# Patient Record
Sex: Male | Born: 1940 | Race: Black or African American | Hispanic: No | Smoking: Never smoker
Health system: Southern US, Community
[De-identification: ages and names within clinical notes are randomized; demographics above are authoritative.]

## PROBLEM LIST (undated history)

## (undated) ENCOUNTER — Ambulatory Visit: Payer: MEDICARE

## (undated) ENCOUNTER — Ambulatory Visit: Attending: Hematology & Oncology | Primary: Hematology & Oncology

## (undated) ENCOUNTER — Encounter

## (undated) ENCOUNTER — Telehealth
Attending: Student in an Organized Health Care Education/Training Program | Primary: Student in an Organized Health Care Education/Training Program

## (undated) ENCOUNTER — Ambulatory Visit

## (undated) ENCOUNTER — Encounter: Attending: Hematology & Oncology | Primary: Hematology & Oncology

## (undated) ENCOUNTER — Encounter: Attending: Pharmacist | Primary: Pharmacist

## (undated) ENCOUNTER — Encounter: Attending: Hematology | Primary: Hematology

## (undated) ENCOUNTER — Telehealth

## (undated) ENCOUNTER — Inpatient Hospital Stay

## (undated) ENCOUNTER — Telehealth: Attending: Hematology & Oncology | Primary: Hematology & Oncology

## (undated) ENCOUNTER — Inpatient Hospital Stay: Payer: MEDICARE

## (undated) ENCOUNTER — Ambulatory Visit: Payer: MEDICARE | Attending: Adult Health | Primary: Adult Health

## (undated) ENCOUNTER — Ambulatory Visit: Payer: MEDICARE | Attending: Hematology & Oncology | Primary: Hematology & Oncology

## (undated) DIAGNOSIS — I10 Essential (primary) hypertension: Secondary | ICD-10-CM

## (undated) DIAGNOSIS — R0600 Dyspnea, unspecified: Secondary | ICD-10-CM

## (undated) DIAGNOSIS — D649 Anemia, unspecified: Secondary | ICD-10-CM

## (undated) DIAGNOSIS — C9310 Chronic myelomonocytic leukemia not having achieved remission: Secondary | ICD-10-CM

## (undated) HISTORY — PX: HERNIA REPAIR: SHX51

## (undated) HISTORY — DX: Chronic myelomonocytic leukemia not having achieved remission: C93.10

---

## 2004-11-21 ENCOUNTER — Ambulatory Visit: Payer: Self-pay | Admitting: Internal Medicine

## 2004-12-12 ENCOUNTER — Ambulatory Visit: Payer: Self-pay | Admitting: Internal Medicine

## 2007-12-16 ENCOUNTER — Ambulatory Visit: Payer: Self-pay | Admitting: Unknown Physician Specialty

## 2010-05-23 ENCOUNTER — Emergency Department: Payer: Self-pay | Admitting: Internal Medicine

## 2011-10-30 ENCOUNTER — Emergency Department: Payer: Self-pay | Admitting: Emergency Medicine

## 2014-03-23 DIAGNOSIS — E6609 Other obesity due to excess calories: Secondary | ICD-10-CM | POA: Diagnosis not present

## 2014-03-23 DIAGNOSIS — E559 Vitamin D deficiency, unspecified: Secondary | ICD-10-CM | POA: Diagnosis not present

## 2014-03-23 DIAGNOSIS — D649 Anemia, unspecified: Secondary | ICD-10-CM | POA: Diagnosis not present

## 2014-03-23 DIAGNOSIS — E782 Mixed hyperlipidemia: Secondary | ICD-10-CM | POA: Diagnosis not present

## 2014-03-23 DIAGNOSIS — I10 Essential (primary) hypertension: Secondary | ICD-10-CM | POA: Diagnosis not present

## 2014-04-06 DIAGNOSIS — L728 Other follicular cysts of the skin and subcutaneous tissue: Secondary | ICD-10-CM | POA: Diagnosis not present

## 2014-04-06 DIAGNOSIS — L720 Epidermal cyst: Secondary | ICD-10-CM | POA: Diagnosis not present

## 2014-04-13 DIAGNOSIS — L728 Other follicular cysts of the skin and subcutaneous tissue: Secondary | ICD-10-CM | POA: Diagnosis not present

## 2014-07-02 DIAGNOSIS — E539 Vitamin B deficiency, unspecified: Secondary | ICD-10-CM | POA: Diagnosis not present

## 2014-07-02 DIAGNOSIS — E559 Vitamin D deficiency, unspecified: Secondary | ICD-10-CM | POA: Diagnosis not present

## 2014-07-02 DIAGNOSIS — E782 Mixed hyperlipidemia: Secondary | ICD-10-CM | POA: Diagnosis not present

## 2014-07-02 DIAGNOSIS — D649 Anemia, unspecified: Secondary | ICD-10-CM | POA: Diagnosis not present

## 2014-07-02 DIAGNOSIS — I10 Essential (primary) hypertension: Secondary | ICD-10-CM | POA: Diagnosis not present

## 2014-07-23 DIAGNOSIS — E6609 Other obesity due to excess calories: Secondary | ICD-10-CM | POA: Diagnosis not present

## 2014-07-23 DIAGNOSIS — E782 Mixed hyperlipidemia: Secondary | ICD-10-CM | POA: Diagnosis not present

## 2014-07-23 DIAGNOSIS — I10 Essential (primary) hypertension: Secondary | ICD-10-CM | POA: Diagnosis not present

## 2014-07-23 DIAGNOSIS — E559 Vitamin D deficiency, unspecified: Secondary | ICD-10-CM | POA: Diagnosis not present

## 2014-09-29 DIAGNOSIS — M722 Plantar fascial fibromatosis: Secondary | ICD-10-CM | POA: Diagnosis not present

## 2014-09-29 DIAGNOSIS — M7732 Calcaneal spur, left foot: Secondary | ICD-10-CM | POA: Diagnosis not present

## 2014-09-29 DIAGNOSIS — M79672 Pain in left foot: Secondary | ICD-10-CM | POA: Diagnosis not present

## 2014-10-14 DIAGNOSIS — E539 Vitamin B deficiency, unspecified: Secondary | ICD-10-CM | POA: Diagnosis not present

## 2014-10-14 DIAGNOSIS — D649 Anemia, unspecified: Secondary | ICD-10-CM | POA: Diagnosis not present

## 2014-10-19 DIAGNOSIS — K5793 Diverticulitis of intestine, part unspecified, without perforation or abscess with bleeding: Secondary | ICD-10-CM | POA: Diagnosis not present

## 2014-10-19 DIAGNOSIS — K921 Melena: Secondary | ICD-10-CM | POA: Diagnosis not present

## 2014-10-19 DIAGNOSIS — D649 Anemia, unspecified: Secondary | ICD-10-CM | POA: Diagnosis not present

## 2014-10-26 DIAGNOSIS — D649 Anemia, unspecified: Secondary | ICD-10-CM | POA: Diagnosis not present

## 2014-10-26 DIAGNOSIS — K5793 Diverticulitis of intestine, part unspecified, without perforation or abscess with bleeding: Secondary | ICD-10-CM | POA: Diagnosis not present

## 2014-10-26 DIAGNOSIS — D6481 Anemia due to antineoplastic chemotherapy: Secondary | ICD-10-CM | POA: Diagnosis not present

## 2014-11-02 DIAGNOSIS — M722 Plantar fascial fibromatosis: Secondary | ICD-10-CM | POA: Diagnosis not present

## 2014-11-03 DIAGNOSIS — K5793 Diverticulitis of intestine, part unspecified, without perforation or abscess with bleeding: Secondary | ICD-10-CM | POA: Diagnosis not present

## 2014-11-03 DIAGNOSIS — D500 Iron deficiency anemia secondary to blood loss (chronic): Secondary | ICD-10-CM | POA: Diagnosis not present

## 2015-01-06 DIAGNOSIS — M722 Plantar fascial fibromatosis: Secondary | ICD-10-CM | POA: Diagnosis not present

## 2015-01-06 DIAGNOSIS — M7732 Calcaneal spur, left foot: Secondary | ICD-10-CM | POA: Diagnosis not present

## 2015-02-01 DIAGNOSIS — Z0001 Encounter for general adult medical examination with abnormal findings: Secondary | ICD-10-CM | POA: Diagnosis not present

## 2015-02-01 DIAGNOSIS — I10 Essential (primary) hypertension: Secondary | ICD-10-CM | POA: Diagnosis not present

## 2015-02-01 DIAGNOSIS — Z8612 Personal history of poliomyelitis: Secondary | ICD-10-CM | POA: Diagnosis not present

## 2015-02-01 DIAGNOSIS — Z23 Encounter for immunization: Secondary | ICD-10-CM | POA: Diagnosis not present

## 2015-02-01 DIAGNOSIS — D500 Iron deficiency anemia secondary to blood loss (chronic): Secondary | ICD-10-CM | POA: Diagnosis not present

## 2015-02-01 DIAGNOSIS — Z125 Encounter for screening for malignant neoplasm of prostate: Secondary | ICD-10-CM | POA: Diagnosis not present

## 2015-02-02 DIAGNOSIS — Z125 Encounter for screening for malignant neoplasm of prostate: Secondary | ICD-10-CM | POA: Diagnosis not present

## 2015-02-02 DIAGNOSIS — R7301 Impaired fasting glucose: Secondary | ICD-10-CM | POA: Diagnosis not present

## 2015-02-02 DIAGNOSIS — E782 Mixed hyperlipidemia: Secondary | ICD-10-CM | POA: Diagnosis not present

## 2015-02-02 DIAGNOSIS — Z0001 Encounter for general adult medical examination with abnormal findings: Secondary | ICD-10-CM | POA: Diagnosis not present

## 2015-02-02 DIAGNOSIS — I10 Essential (primary) hypertension: Secondary | ICD-10-CM | POA: Diagnosis not present

## 2015-02-03 DIAGNOSIS — M7732 Calcaneal spur, left foot: Secondary | ICD-10-CM | POA: Diagnosis not present

## 2015-02-03 DIAGNOSIS — M722 Plantar fascial fibromatosis: Secondary | ICD-10-CM | POA: Diagnosis not present

## 2015-04-05 DIAGNOSIS — D509 Iron deficiency anemia, unspecified: Secondary | ICD-10-CM | POA: Diagnosis not present

## 2015-04-05 DIAGNOSIS — I10 Essential (primary) hypertension: Secondary | ICD-10-CM | POA: Insufficient documentation

## 2015-04-05 DIAGNOSIS — D500 Iron deficiency anemia secondary to blood loss (chronic): Secondary | ICD-10-CM | POA: Insufficient documentation

## 2015-04-30 ENCOUNTER — Encounter: Payer: Self-pay | Admitting: *Deleted

## 2015-05-03 ENCOUNTER — Encounter
Admission: RE | Disposition: A | Discharge status: Home / Self Care | Payer: Self-pay | Source: Ambulatory Visit | Attending: Gastroenterology

## 2015-05-03 ENCOUNTER — Ambulatory Visit: Payer: Medicare Other | Admitting: Anesthesiology

## 2015-05-03 ENCOUNTER — Encounter: Payer: Self-pay | Admitting: *Deleted

## 2015-05-03 ENCOUNTER — Ambulatory Visit
Admission: RE | Admit: 2015-05-03 | Discharge: 2015-05-03 | Disposition: A | Discharge status: Home / Self Care | Payer: Medicare Other | Source: Ambulatory Visit | Attending: Gastroenterology | Admitting: Gastroenterology

## 2015-05-03 DIAGNOSIS — D509 Iron deficiency anemia, unspecified: Secondary | ICD-10-CM | POA: Insufficient documentation

## 2015-05-03 DIAGNOSIS — Z538 Procedure and treatment not carried out for other reasons: Secondary | ICD-10-CM | POA: Diagnosis not present

## 2015-05-03 DIAGNOSIS — I10 Essential (primary) hypertension: Secondary | ICD-10-CM | POA: Diagnosis not present

## 2015-05-03 DIAGNOSIS — K921 Melena: Secondary | ICD-10-CM | POA: Insufficient documentation

## 2015-05-03 DIAGNOSIS — K562 Volvulus: Secondary | ICD-10-CM | POA: Insufficient documentation

## 2015-05-03 DIAGNOSIS — Z79899 Other long term (current) drug therapy: Secondary | ICD-10-CM | POA: Insufficient documentation

## 2015-05-03 HISTORY — PX: ESOPHAGOGASTRODUODENOSCOPY (EGD) WITH PROPOFOL: SHX5813

## 2015-05-03 HISTORY — DX: Essential (primary) hypertension: I10

## 2015-05-03 HISTORY — PX: COLONOSCOPY WITH PROPOFOL: SHX5780

## 2015-05-03 HISTORY — DX: Anemia, unspecified: D64.9

## 2015-05-03 SURGERY — COLONOSCOPY WITH PROPOFOL
Anesthesia: General

## 2015-05-03 MED ORDER — FENTANYL CITRATE (PF) 100 MCG/2ML IJ SOLN
INTRAMUSCULAR | Status: DC | PRN
  Administered 2015-05-03: 50 ug via INTRAVENOUS

## 2015-05-03 MED ORDER — SODIUM CHLORIDE 0.9 % IV SOLN: INTRAVENOUS | Status: DC

## 2015-05-03 MED ORDER — PROPOFOL 500 MG/50ML IV EMUL
INTRAVENOUS | Status: DC | PRN
  Administered 2015-05-03: 50 ug/kg/min via INTRAVENOUS

## 2015-05-03 MED ORDER — SODIUM CHLORIDE 0.9 % IV SOLN
INTRAVENOUS | Status: DC | PRN
  Administered 2015-05-03: 12:00:00 via INTRAVENOUS

## 2015-05-03 MED ORDER — SODIUM CHLORIDE 0.9 % IV SOLN
INTRAVENOUS | Status: DC
  Administered 2015-05-03: 12:00:00 via INTRAVENOUS

## 2015-05-03 MED ORDER — MIDAZOLAM HCL 2 MG/2ML IJ SOLN
INTRAMUSCULAR | Status: DC | PRN
  Administered 2015-05-03: 1 mg via INTRAVENOUS

## 2015-05-03 NOTE — Anesthesia Preprocedure Evaluation (Signed)
Anesthesia Evaluation  Patient identified by MRN, date of birth, ID band Patient awake    Reviewed: Allergy & Precautions, H&P , NPO status , Patient's Chart, lab work & pertinent test results, reviewed documented beta blocker date and time   Airway Mallampati: II   Neck ROM: full    Dental  (+) Partial Upper   Pulmonary neg pulmonary ROS,    Pulmonary exam normal        Cardiovascular hypertension, negative cardio ROS Normal cardiovascular exam     Neuro/Psych negative neurological ROS  negative psych ROS   GI/Hepatic negative GI ROS, Neg liver ROS,   Endo/Other  negative endocrine ROS  Renal/GU negative Renal ROS  negative genitourinary   Musculoskeletal   Abdominal   Peds  Hematology negative hematology ROS (+) anemia ,   Anesthesia Other Findings Past Medical History:   Hypertension                                                 Anemia                                                     Past Surgical History:   HERNIA REPAIR                                                   Comment:1975 BMI    Body Mass Index   39.62 kg/m 2     Reproductive/Obstetrics                             Anesthesia Physical Anesthesia Plan  ASA: III  Anesthesia Plan: General   Post-op Pain Management:    Induction:   Airway Management Planned:   Additional Equipment:   Intra-op Plan:   Post-operative Plan:   Informed Consent: I have reviewed the patients History and Physical, chart, labs and discussed the procedure including the risks, benefits and alternatives for the proposed anesthesia with the patient or authorized representative who has indicated his/her understanding and acceptance.   Dental Advisory Given  Plan Discussed with: CRNA  Anesthesia Plan Comments:         Anesthesia Quick Evaluation

## 2015-05-03 NOTE — Op Note (Signed)
[facility] Gastroenterology Patient Name: Samuel Frank Procedure Date: 05/03/2015 12:29 PM MRN: [id number] Account #: [id number] Date of Birth: [birth year] Admit Type: Outpatient Age: 75 Room: [facility] ENDO ROOM 4 Gender: Male Note Status: Finalized Procedure:            Upper GI endoscopy Indications:          Iron deficiency anemia Providers:            Samuel Dawn. Candace Cruise, MD Referring MD:         Lavera Guise, MD (Referring MD) Medicines:            Monitored Anesthesia Care Complications:        No immediate complications. Procedure:            Pre-Anesthesia Assessment:                       - Prior to the procedure, a History and Physical was                        performed, and patient medications, allergies and                        sensitivities were reviewed. The patient's tolerance of                        previous anesthesia was reviewed.                       - The risks and benefits of the procedure and the                        sedation options and risks were discussed with the                        patient. All questions were answered and informed                        consent was obtained.                       - After reviewing the risks and benefits, the patient                        was deemed in satisfactory condition to undergo the                        procedure.                       After obtaining informed consent, the endoscope was                        passed under direct vision. Throughout the procedure,                        the patient's blood pressure, pulse, and oxygen                        saturations were monitored continuously. The Endoscope  was introduced through the mouth, and advanced to the                        second part of duodenum. The upper GI endoscopy was                        accomplished without difficulty. The patient tolerated                        the procedure well. Findings:  The examined esophagus was normal.      The entire examined stomach was normal.      The examined duodenum was normal. Impression:           - Normal esophagus.                       - Normal stomach.                       - Normal examined duodenum.                       - No specimens collected. Recommendation:       - Discharge patient to home.                       - Observe patient's clinical course.                       - The findings and recommendations were discussed with                        the patient. Procedure Code(s):    --- Professional ---                       [phone], Esophagogastroduodenoscopy, flexible, transoral;                        diagnostic, including collection of specimen(s) by                        brushing or washing, when performed (separate procedure) Diagnosis Code(s):    --- Professional ---                       D50.9, Iron deficiency anemia, unspecified CPT copyright 2016 American Medical Association. All rights reserved. The codes documented in this report are preliminary and upon coder review may  be revised to meet current compliance requirements. Samuel Luster, MD 05/03/2015 12:38:30 PM This report has been signed electronically. Number of Addenda: 0 Note Initiated On: 05/03/2015 12:29 PM      [facility]

## 2015-05-03 NOTE — Transfer of Care (Signed)
Immediate Anesthesia Transfer of Care Note  Patient: Samuel Frank.  Procedure(s) Performed: Procedure(s): COLONOSCOPY WITH PROPOFOL (N/A) ESOPHAGOGASTRODUODENOSCOPY (EGD) WITH PROPOFOL (N/A)  Patient Location: PACU  Anesthesia Type:General  Level of Consciousness: awake and alert   Airway & Oxygen Therapy: Patient Spontanous Breathing and Patient connected to nasal cannula oxygen  Post-op Assessment: Report given to RN  Post vital signs: Reviewed and stable  Last Vitals:  Filed Vitals:   05/03/15 1121 05/03/15 1253  BP: 125/68 97/68  Pulse: 91 84  Temp: 37.2 C 36 C  Resp: 16 16    Complications: No apparent anesthesia complications

## 2015-05-03 NOTE — H&P (Signed)
  Date of Initial H&P:04/05/2015  History reviewed, patient examined, no change in status, stable for surgery.

## 2015-05-03 NOTE — Op Note (Signed)
[facility] Gastroenterology Patient Name: Samuel Frank Procedure Date: 05/03/2015 12:28 PM MRN: [id number] Account #: [id number] Date of Birth: [birth year] Admit Type: Outpatient Age: 75 Room: [facility] ENDO ROOM 4 Gender: Male Note Status: Finalized Procedure:            Colonoscopy Indications:          Iron deficiency anemia Providers:            Lupita Dawn. Candace Cruise, MD Referring MD:         Lavera Guise, MD (Referring MD) Medicines:            Monitored Anesthesia Care Complications:        No immediate complications. Procedure:            Pre-Anesthesia Assessment:                       - Prior to the procedure, a History and Physical was                        performed, and patient medications, allergies and                        sensitivities were reviewed. The patient's tolerance of                        previous anesthesia was reviewed.                       - The risks and benefits of the procedure and the                        sedation options and risks were discussed with the                        patient. All questions were answered and informed                        consent was obtained.                       - After reviewing the risks and benefits, the patient                        was deemed in satisfactory condition to undergo the                        procedure.                       After obtaining informed consent, the colonoscope was                        passed under direct vision. Throughout the procedure,                        the patient's blood pressure, pulse, and oxygen                        saturations were monitored continuously. The Olympus  CF-H180AL colonoscope ( S#: [id number] ) was introduced                        through the anus and advanced to the the ascending                        colon. The colonoscopy was performed with moderate                        difficulty due to significant looping. Findings:     The colon (entire examined portion) appeared normal. Unable to reach       scope beyond ascending colon despite manual pressure and changes in       position. Impression:           - The entire examined colon is normal.                       - No specimens collected. Recommendation:       - Discharge patient to home.                       - The findings and recommendations were discussed with                        the patient.                       - Consider BE to evaluate right side of colon if anemia                        persists. Procedure Code(s):    --- Professional ---                       [phone], 47, Colonoscopy, flexible; diagnostic, including                        collection of specimen(s) by brushing or washing, when                        performed (separate procedure) Diagnosis Code(s):    --- Professional ---                       D50.9, Iron deficiency anemia, unspecified CPT copyright 2016 American Medical Association. All rights reserved. The codes documented in this report are preliminary and upon coder review may  be revised to meet current compliance requirements. [name], MD 05/03/2015 1:12:40 PM This report has been signed electronically. Number of Addenda: 0 Note Initiated On: 05/03/2015 12:28 PM Scope Withdrawal Time: 0 hours 13 minutes 15 seconds  Total Procedure Duration: 0 hours 20 minutes 14 seconds       [facility]

## 2015-05-03 NOTE — Anesthesia Postprocedure Evaluation (Signed)
Anesthesia Post Note  Patient: Damany Krikorian.  Procedure(s) Performed: Procedure(s) (LRB): COLONOSCOPY WITH PROPOFOL (N/A) ESOPHAGOGASTRODUODENOSCOPY (EGD) WITH PROPOFOL (N/A)  Patient location during evaluation: PACU Anesthesia Type: General Level of consciousness: awake and alert Pain management: pain level controlled Vital Signs Assessment: post-procedure vital signs reviewed and stable Respiratory status: spontaneous breathing, nonlabored ventilation, respiratory function stable and patient connected to nasal cannula oxygen Cardiovascular status: blood pressure returned to baseline and stable Postop Assessment: no signs of nausea or vomiting Anesthetic complications: no    Last Vitals:  Filed Vitals:   05/03/15 1254 05/03/15 1304  BP: 106/48 106/60  Pulse: 84 81  Temp: 35.6 C   Resp: 16 18    Last Pain: There were no vitals filed for this visit.               Molli Barrows

## 2015-05-05 ENCOUNTER — Encounter: Payer: Self-pay | Admitting: Gastroenterology

## 2015-08-06 DIAGNOSIS — L718 Other rosacea: Secondary | ICD-10-CM | POA: Diagnosis not present

## 2015-08-06 DIAGNOSIS — L309 Dermatitis, unspecified: Secondary | ICD-10-CM | POA: Diagnosis not present

## 2015-08-06 DIAGNOSIS — B081 Molluscum contagiosum: Secondary | ICD-10-CM | POA: Diagnosis not present

## 2015-09-21 DIAGNOSIS — M79604 Pain in right leg: Secondary | ICD-10-CM | POA: Diagnosis not present

## 2015-09-21 DIAGNOSIS — M7061 Trochanteric bursitis, right hip: Secondary | ICD-10-CM | POA: Diagnosis not present

## 2015-09-21 DIAGNOSIS — M47816 Spondylosis without myelopathy or radiculopathy, lumbar region: Secondary | ICD-10-CM | POA: Diagnosis not present

## 2015-11-05 DIAGNOSIS — Z0001 Encounter for general adult medical examination with abnormal findings: Secondary | ICD-10-CM | POA: Diagnosis not present

## 2015-11-05 DIAGNOSIS — D500 Iron deficiency anemia secondary to blood loss (chronic): Secondary | ICD-10-CM | POA: Diagnosis not present

## 2015-11-05 DIAGNOSIS — I10 Essential (primary) hypertension: Secondary | ICD-10-CM | POA: Diagnosis not present

## 2015-11-05 DIAGNOSIS — E782 Mixed hyperlipidemia: Secondary | ICD-10-CM | POA: Diagnosis not present

## 2015-11-05 DIAGNOSIS — R7301 Impaired fasting glucose: Secondary | ICD-10-CM | POA: Diagnosis not present

## 2015-11-05 DIAGNOSIS — Z7689 Persons encountering health services in other specified circumstances: Secondary | ICD-10-CM | POA: Diagnosis not present

## 2016-01-14 DIAGNOSIS — Z23 Encounter for immunization: Secondary | ICD-10-CM | POA: Diagnosis not present

## 2016-03-07 DIAGNOSIS — E782 Mixed hyperlipidemia: Secondary | ICD-10-CM | POA: Diagnosis not present

## 2016-03-07 DIAGNOSIS — E539 Vitamin B deficiency, unspecified: Secondary | ICD-10-CM | POA: Diagnosis not present

## 2016-03-07 DIAGNOSIS — I10 Essential (primary) hypertension: Secondary | ICD-10-CM | POA: Diagnosis not present

## 2016-03-07 DIAGNOSIS — L922 Granuloma faciale [eosinophilic granuloma of skin]: Secondary | ICD-10-CM | POA: Diagnosis not present

## 2016-03-14 DIAGNOSIS — L720 Epidermal cyst: Secondary | ICD-10-CM | POA: Diagnosis not present

## 2016-04-10 DIAGNOSIS — L728 Other follicular cysts of the skin and subcutaneous tissue: Secondary | ICD-10-CM | POA: Diagnosis not present

## 2016-04-10 DIAGNOSIS — L720 Epidermal cyst: Secondary | ICD-10-CM | POA: Diagnosis not present

## 2016-08-31 DIAGNOSIS — M5412 Radiculopathy, cervical region: Secondary | ICD-10-CM | POA: Diagnosis not present

## 2016-08-31 DIAGNOSIS — M25511 Pain in right shoulder: Secondary | ICD-10-CM | POA: Diagnosis not present

## 2016-08-31 DIAGNOSIS — G8929 Other chronic pain: Secondary | ICD-10-CM | POA: Diagnosis not present

## 2016-08-31 DIAGNOSIS — A809 Acute poliomyelitis, unspecified: Secondary | ICD-10-CM | POA: Insufficient documentation

## 2016-08-31 DIAGNOSIS — M50322 Other cervical disc degeneration at C5-C6 level: Secondary | ICD-10-CM | POA: Insufficient documentation

## 2016-09-14 DIAGNOSIS — I10 Essential (primary) hypertension: Secondary | ICD-10-CM | POA: Diagnosis not present

## 2016-09-14 DIAGNOSIS — M791 Myalgia: Secondary | ICD-10-CM | POA: Diagnosis not present

## 2016-09-14 DIAGNOSIS — M5441 Lumbago with sciatica, right side: Secondary | ICD-10-CM | POA: Diagnosis not present

## 2016-11-13 DIAGNOSIS — E782 Mixed hyperlipidemia: Secondary | ICD-10-CM | POA: Diagnosis not present

## 2016-11-13 DIAGNOSIS — I10 Essential (primary) hypertension: Secondary | ICD-10-CM | POA: Diagnosis not present

## 2016-11-13 DIAGNOSIS — Z125 Encounter for screening for malignant neoplasm of prostate: Secondary | ICD-10-CM | POA: Diagnosis not present

## 2016-11-13 DIAGNOSIS — Z0001 Encounter for general adult medical examination with abnormal findings: Secondary | ICD-10-CM | POA: Diagnosis not present

## 2017-02-19 DIAGNOSIS — M7551 Bursitis of right shoulder: Secondary | ICD-10-CM | POA: Diagnosis not present

## 2017-03-13 ENCOUNTER — Encounter: Payer: Self-pay | Admitting: Nurse Practitioner

## 2017-03-13 DIAGNOSIS — G8929 Other chronic pain: Secondary | ICD-10-CM | POA: Diagnosis not present

## 2017-03-13 DIAGNOSIS — M5440 Lumbago with sciatica, unspecified side: Secondary | ICD-10-CM | POA: Diagnosis not present

## 2017-03-13 DIAGNOSIS — M7061 Trochanteric bursitis, right hip: Secondary | ICD-10-CM | POA: Insufficient documentation

## 2017-03-13 DIAGNOSIS — M1611 Unilateral primary osteoarthritis, right hip: Secondary | ICD-10-CM | POA: Insufficient documentation

## 2017-03-13 DIAGNOSIS — M25551 Pain in right hip: Secondary | ICD-10-CM | POA: Diagnosis not present

## 2017-03-13 DIAGNOSIS — E669 Obesity, unspecified: Secondary | ICD-10-CM | POA: Insufficient documentation

## 2017-05-06 ENCOUNTER — Encounter: Payer: Self-pay | Admitting: Emergency Medicine

## 2017-05-06 ENCOUNTER — Emergency Department: Payer: Medicare Other

## 2017-05-06 ENCOUNTER — Emergency Department
Admission: EM | Admit: 2017-05-06 | Discharge: 2017-05-06 | Disposition: A | Discharge status: Home / Self Care | Payer: Medicare Other | Attending: Emergency Medicine | Admitting: Emergency Medicine

## 2017-05-06 DIAGNOSIS — S2232XA Fracture of one rib, left side, initial encounter for closed fracture: Secondary | ICD-10-CM

## 2017-05-06 DIAGNOSIS — Y939 Activity, unspecified: Secondary | ICD-10-CM | POA: Diagnosis not present

## 2017-05-06 DIAGNOSIS — R0602 Shortness of breath: Secondary | ICD-10-CM | POA: Diagnosis not present

## 2017-05-06 DIAGNOSIS — Z79899 Other long term (current) drug therapy: Secondary | ICD-10-CM | POA: Diagnosis not present

## 2017-05-06 DIAGNOSIS — I10 Essential (primary) hypertension: Secondary | ICD-10-CM | POA: Insufficient documentation

## 2017-05-06 DIAGNOSIS — Y92009 Unspecified place in unspecified non-institutional (private) residence as the place of occurrence of the external cause: Secondary | ICD-10-CM | POA: Insufficient documentation

## 2017-05-06 DIAGNOSIS — Y998 Other external cause status: Secondary | ICD-10-CM | POA: Diagnosis not present

## 2017-05-06 DIAGNOSIS — R079 Chest pain, unspecified: Secondary | ICD-10-CM | POA: Diagnosis not present

## 2017-05-06 DIAGNOSIS — S299XXA Unspecified injury of thorax, initial encounter: Secondary | ICD-10-CM | POA: Diagnosis not present

## 2017-05-06 DIAGNOSIS — R0781 Pleurodynia: Secondary | ICD-10-CM | POA: Diagnosis not present

## 2017-05-06 DIAGNOSIS — W08XXXA Fall from other furniture, initial encounter: Secondary | ICD-10-CM | POA: Insufficient documentation

## 2017-05-06 MED ORDER — HYDROCODONE-ACETAMINOPHEN 5-325 MG PO TABS: 1.0000 | ORAL_TABLET | Freq: Four times a day (QID) | ORAL | 0 refills | Status: DC | PRN

## 2017-05-06 MED ORDER — HYDROCODONE-ACETAMINOPHEN 5-325 MG PO TABS
1.0000 | ORAL_TABLET | Freq: Once | ORAL | Status: AC
  Administered 2017-05-06: 1 via ORAL
  Filled 2017-05-06: qty 1

## 2017-05-06 MED ORDER — IBUPROFEN 600 MG PO TABS
600.0000 mg | ORAL_TABLET | Freq: Once | ORAL | Status: AC
Start: 2017-05-06 — End: 2017-05-06
  Administered 2017-05-06: 600 mg via ORAL
  Filled 2017-05-06: qty 1

## 2017-05-06 NOTE — Discharge Instructions (Signed)
Please take your pain medication as needed for severe symptoms and follow-up with your primary care physician as needed.  Return to the emergency department for any concerns whatsoever.  It was a pleasure to take care of you today, and thank you for coming to our emergency department.  If you have any questions or concerns before leaving please ask the nurse to grab me and I'm more than happy to go through your aftercare instructions again.  If you were prescribed any opioid pain medication today such as Norco, Vicodin, Percocet, morphine, hydrocodone, or oxycodone please make sure you do not drive when you are taking this medication as it can alter your ability to drive safely.  If you have any concerns once you are home that you are not improving or are in fact getting worse before you can make it to your follow-up appointment, please do not hesitate to call 911 and come back for further evaluation.  Darel Hong, MD  No results found for this or any previous visit. Dg Chest 2 View  Result Date: 05/06/2017 CLINICAL DATA:  77 y/o  M; fall with left rib pain. EXAM: CHEST  2 VIEW COMPARISON:  None. FINDINGS: Borderline enlarged cardiac silhouette. Aortic calcific atherosclerosis. Streaky opacities in left lung base. No pleural effusion or pneumothorax. Moderate multilevel degenerative changes of thoracic spine. No displaced rib fracture identified. No pneumothorax. IMPRESSION: No displaced rib fracture identified. If there is focal pain, consider dedicated rib radiographs. Streaky left lung base opacity is probably atelectasis, possibly splinting given pain, less likely pneumonia. Electronically Signed   By: Kristine Garbe M.D.   On: 05/06/2017 01:02

## 2017-05-06 NOTE — ED Triage Notes (Signed)
Patient states that he fell off of the cough Thursday night. Patient with complaint of left rib pain.

## 2017-05-06 NOTE — ED Notes (Signed)
Pt complains of pain to left lateral lower ribs and left upper flank on palpation. No bruising noted. Pt states he fell off the cough 3 days pta, landing on hardwood floor. Pt denies shob, fever, but states "it hurts my ribs to take a deep breath".

## 2017-05-06 NOTE — ED Notes (Signed)
Patient transported to X-ray 

## 2017-05-06 NOTE — ED Provider Notes (Signed)
[facility] Emergency Department Provider Note  ____________________________________________   First MD Initiated Contact with Patient 05/06/17 0330     (approximate)  I have reviewed the triage vital signs and the nursing notes.   HISTORY  Chief Complaint Chest Pain and Fall   HPI Samuel Frank. is a 77 y.o. male who comes to the emergency department with left lateral chest pain for the past 3 days.  His pain is moderate to severe.  It began suddenly when he slipped and fell landing on his left side.  It is associated with mild shortness of breath.  His pain is worse when taking a deep breath and improved with rest.  Past Medical History:  Diagnosis Date  . Anemia   . Hypertension     There are no active problems to display for this patient.   Past Surgical History:  Procedure Laterality Date  . COLONOSCOPY WITH PROPOFOL N/A 05/03/2015   Procedure: COLONOSCOPY WITH PROPOFOL;  Surgeon: [name], MD;  Location: [facility] ENDOSCOPY;  Service: Gastroenterology;  Laterality: N/A;  . ESOPHAGOGASTRODUODENOSCOPY (EGD) WITH PROPOFOL N/A 05/03/2015   Procedure: ESOPHAGOGASTRODUODENOSCOPY (EGD) WITH PROPOFOL;  Surgeon: [name], MD;  Location: [facility] ENDOSCOPY;  Service: Gastroenterology;  Laterality: N/A;  . [place]    Prior to Admission medications   Medication Sig Start Date End Date Taking? Authorizing Provider  cyanocobalamin 500 MCG tablet Take 500 mcg by mouth daily.    [provider]  ferrous gluconate (FERGON) 324 MG tablet Take 324 mg by mouth daily with breakfast.    [provider]  HYDROcodone-acetaminophen (NORCO) 5-325 MG tablet Take 1 tablet by mouth every 6 (six) hours as needed for up to 15 doses for severe pain. 05/06/17   Darel Hong, MD  valsartan-hydrochlorothiazide (DIOVAN-HCT) 320-25 MG tablet Take 1 tablet by mouth daily.    [provider]    Allergies Patient has no known  allergies.  No family history on file.  Social History Social History   Tobacco Use  . Smoking status: Never Smoker  . Smokeless tobacco: Never Used  Substance Use Topics  . Alcohol use: No  . Drug use: No    Review of Systems Constitutional: No fever/chills ENT: No sore throat. Cardiovascular: Positive for chest pain. Respiratory: Positive for shortness of breath. Gastrointestinal: No abdominal pain.  No nausea, no vomiting.  No diarrhea.  No constipation. Musculoskeletal: Negative for back pain. Neurological: Negative for headaches   ____________________________________________   PHYSICAL EXAM:  VITAL SIGNS: ED Triage Vitals  Enc Vitals Group     BP 05/06/17 0044 123/70     Pulse Rate 05/06/17 0044 93     Resp 05/06/17 0044 16     Temp 05/06/17 0044 98.3 F (36.8 C)     Temp Source 05/06/17 0044 Oral     SpO2 05/06/17 0044 100 %     Weight 05/06/17 0026 (!) 350 lb (158.8 kg)     Height 05/06/17 0026 6\' 3"  (1.905 m)     Head Circumference --      Peak Flow --      Pain Score 05/06/17 0025 10     Pain Loc --      Pain Edu? --      Excl. in Yell? --     Constitutional: Alert and oriented x4 appears uncomfortable nontoxic no diaphoresis speaks in full clear sentences Head: Atraumatic. Nose: No congestion/rhinnorhea. Mouth/Throat: No trismus  Neck: No stridor.   Cardiovascular: Regular rate and rhythm No crepitus although tender over her left anterior chest wall Respiratory: Normal respiratory effort.  No retractions. Gastrointestinal: Soft nontender Neurologic:  Normal speech and language. No gross focal neurologic deficits are appreciated.  Skin:  Skin is warm, dry and intact. No rash noted.    ____________________________________________  LABS (all labs ordered are listed, but only abnormal results are displayed)  Labs Reviewed - No data to  display   __________________________________________  EKG   ____________________________________________  RADIOLOGY  Chest x-ray reviewed by me with no acute disease noted ____________________________________________   DIFFERENTIAL includes but not limited to  Rib fracture, rib contusion, pneumothorax, hemothorax, pulmonary contusion   PROCEDURES  Procedure(s) performed: no  Procedures  Critical Care performed: no  Observation: no ____________________________________________   INITIAL IMPRESSION / ASSESSMENT AND PLAN / ED COURSE  Pertinent labs & imaging results that were available during my care of the patient were reviewed by me and considered in my medical decision making (see chart for details).  Fortunately the patient's chest x-ray is reassuring it has no hemothorax and no pneumothorax.  There is no obvious fracture although clinically he does have focal tenderness which is consistent with a nondisplaced fracture.  I will treat him symptomatically with pain control and refer him to primary care.  He verbalizes understanding and agreement with plan.      ____________________________________________   FINAL CLINICAL IMPRESSION(S) / ED DIAGNOSES  Final diagnoses:  Closed fracture of one rib of left side, initial encounter      NEW MEDICATIONS STARTED DURING THIS VISIT:  Discharge Medication List as of 05/06/2017  3:36 AM    START taking these medications   Details  HYDROcodone-acetaminophen (NORCO) 5-325 MG tablet Take 1 tablet by mouth every 6 (six) hours as needed for up to 15 doses for severe pain., Starting Sun 05/06/2017, Print         Note:  This document was prepared using Dragon voice recognition software and may include unintentional dictation errors.      Darel Hong, MD 05/07/17 [phone]

## 2017-05-07 ENCOUNTER — Other Ambulatory Visit: Payer: Self-pay

## 2017-05-07 NOTE — Patient Outreach (Signed)
Attempted to reach patient per recent ED visit. Will mail unsuccessful letter, know before you go and magnet.

## 2017-05-10 ENCOUNTER — Telehealth: Payer: Self-pay

## 2017-05-10 NOTE — Telephone Encounter (Signed)
Letter ready.

## 2017-05-10 NOTE — Telephone Encounter (Signed)
-----   Message from Lavera Guise, MD sent at 05/10/2017 12:50 PM EST ----- Pt slipped and fell, so we can extend his work note for one more week,  ----- Message ----- From: Laurie Panda Sent: 05/10/2017  11:51 AM To: Lavera Guise, MD  Patient seen in er last week notes are there for rib fracture and he is still having pain and was supposed to return to work next week but concerned due to pain and his moping and cleaning janitor work, please review and advise are giving pt longer work excuse or let you see patient Wednesday?

## 2017-05-10 NOTE — Telephone Encounter (Signed)
Patient work note up front ready for pick up. Patient has been advised/ BR

## 2017-05-31 ENCOUNTER — Encounter: Payer: Self-pay | Admitting: Nurse Practitioner

## 2017-05-31 ENCOUNTER — Ambulatory Visit: Payer: Medicare Other | Admitting: Nurse Practitioner

## 2017-05-31 VITALS — BP 124/76 | HR 99 | Resp 16 | Ht 75.0 in | Wt 305.6 lb

## 2017-05-31 DIAGNOSIS — I10 Essential (primary) hypertension: Secondary | ICD-10-CM

## 2017-05-31 DIAGNOSIS — S2232XD Fracture of one rib, left side, subsequent encounter for fracture with routine healing: Secondary | ICD-10-CM

## 2017-05-31 DIAGNOSIS — K219 Gastro-esophageal reflux disease without esophagitis: Secondary | ICD-10-CM

## 2017-05-31 MED ORDER — PANTOPRAZOLE SODIUM 40 MG PO TBEC: 40.0000 mg | DELAYED_RELEASE_TABLET | Freq: Every day | ORAL | 4 refills | Status: DC

## 2017-05-31 NOTE — Progress Notes (Signed)
[facility] [place], [place] [address]  Internal MEDICINE  Office Visit Note  Patient Name: Samuel Frank  [id number]  [id number]  Date of Service: 06/24/2017  Chief Complaint  Patient presents with  . Follow-up    fell at home. hit his chest on the coffeetable. was diagnosed with cracked rib on left side.     The patient is here for routine follow up/hospital follow up. He was seen in ER 05/06/2017 after he fell at home. He hit his chest on his coffee table. He was diagnosed with single cracked rib on the left side. Is gradually improving. No problems with breathing. States that it still hurts when he coughs or sneezes, but that is also getting better.    Pt is here for routine follow up.    Current Medication: Outpatient Encounter Medications as of 05/31/2017  Medication Sig  . aspirin EC 81 MG tablet Take 81 mg by mouth daily.  [name] HYDROcodone-acetaminophen (NORCO) 5-325 MG tablet Take 1 tablet by mouth every 6 (six) hours as needed for up to 15 doses for severe pain.  [name] tiZANidine (ZANAFLEX) 4 MG tablet Take by mouth.  . cyanocobalamin 500 MCG tablet Take 500 mcg by mouth daily.  [name] ibuprofen (ADVIL,MOTRIN) 200 MG tablet Take by mouth.  . pantoprazole (PROTONIX) 40 MG tablet Take 1 tablet (40 mg total) by mouth daily.  . valsartan-hydrochlorothiazide (DIOVAN-HCT) 320-25 MG tablet Take 1 tablet by mouth daily.  . [DISCONTINUED] ferrous gluconate (FERGON) 324 MG tablet Take 324 mg by mouth daily with breakfast.  . [DISCONTINUED] pantoprazole (PROTONIX) 40 MG tablet Take 40 mg by mouth daily.   No facility-administered encounter medications on file as of 05/31/2017.     Surgical History: Past Surgical History:  Procedure Laterality Date  . COLONOSCOPY WITH PROPOFOL N/A 05/03/2015   Procedure: COLONOSCOPY WITH PROPOFOL;  Surgeon: [name], MD;  Location: [facility] ENDOSCOPY;  Service: Gastroenterology;  Laterality: N/A;  . ESOPHAGOGASTRODUODENOSCOPY (EGD)  WITH PROPOFOL N/A 05/03/2015   Procedure: ESOPHAGOGASTRODUODENOSCOPY (EGD) WITH PROPOFOL;  Surgeon: [name], MD;  Location: [facility] ENDOSCOPY;  Service: Gastroenterology;  Laterality: N/A;  . HERNIA REPAIR     1975    Medical History: Past Medical History:  Diagnosis Date  . Anemia   . Hypertension     Family History: Family History  Problem Relation Age of Onset  . Alzheimer's disease Father     Social History   Socioeconomic History  . Marital status: Divorced    Spouse name: Not on file  . Number of children: Not on file  . Years of education: Not on file  . Highest education level: Not on file  Occupational History  . Not on file  Social Needs  . Financial resource strain: Not on file  . Food insecurity:    Worry: Not on file    Inability: Not on file  . Transportation needs:    Medical: Not on file    Non-medical: Not on file  Tobacco Use  . Smoking status: Never Smoker  . Smokeless tobacco: Never Used  Substance and Sexual Activity  . Alcohol use: No  . Drug use: No  . Sexual activity: Not on file  Lifestyle  . Physical activity:    Days per week: Not on file    Minutes per session: Not on file  . Stress: Not on file  Relationships  . Social connections:    Talks on phone: Not on file  Gets together: Not on file    Attends religious service: Not on file    Active member of club or organization: Not on file    Attends meetings of clubs or organizations: Not on file    Relationship status: Not on file  . Intimate partner violence:    Fear of current or ex partner: Not on file    Emotionally abused: Not on file    Physically abused: Not on file    Forced sexual activity: Not on file  Other Topics Concern  . Not on file  Social History Narrative  . Not on file      Review of Systems  Constitutional: Positive for activity change and fatigue. Negative for chills and unexpected weight change.  HENT: Negative for congestion, postnasal drip,  rhinorrhea, sneezing and sore throat.   Eyes: Negative.  Negative for redness.  Respiratory: Negative for cough, chest tightness, shortness of breath and wheezing.   Cardiovascular: Positive for chest pain. Negative for palpitations.       Chest pain is musculoskeletal in nature from recent fall and cracked rib  Gastrointestinal: Negative for abdominal pain, constipation, diarrhea, nausea and vomiting.  Endocrine: Negative for cold intolerance, heat intolerance, polydipsia, polyphagia and polyuria.  Genitourinary: Negative for dysuria, frequency and urgency.  Musculoskeletal: Positive for arthralgias and myalgias. Negative for back pain, joint swelling and neck pain.  Skin: Negative for rash.  Allergic/Immunologic: Negative for environmental allergies.  Neurological: Negative.  Negative for dizziness, tremors, numbness and headaches.  Hematological: Negative for adenopathy. Does not bruise/bleed easily.  Psychiatric/Behavioral: Negative for behavioral problems (Depression), sleep disturbance and suicidal ideas. The patient is not nervous/anxious.     Today's Vitals   05/31/17 1558  BP: 124/76  Pulse: 99  Resp: 16  SpO2: 95%  Weight: (!) 305 lb 9.6 oz (138.6 kg)  Height: 6\' 3"  (1.905 m)    Physical Exam  Constitutional: He is oriented to person, place, and time. He appears well-developed and well-nourished. No distress.  HENT:  Head: Normocephalic and atraumatic.  Mouth/Throat: Oropharynx is clear and moist. No oropharyngeal exudate.  Eyes: Pupils are equal, round, and reactive to light. EOM are normal.  Neck: Normal range of motion. Neck supple. No JVD present. Carotid bruit is not present. No tracheal deviation present. No thyromegaly present.  Cardiovascular: Normal rate, regular rhythm and normal heart sounds. Exam reveals no gallop and no friction rub.  No murmur heard. Pulmonary/Chest: Effort normal and breath sounds normal. No respiratory distress. He has no wheezes. He has  no rales. He exhibits no tenderness.  Tenderness without swelling or bruising present.   Abdominal: Soft. Bowel sounds are normal. There is no tenderness.  Musculoskeletal: Normal range of motion.  Tenderness along the left lower rib cage without swelling or bruising present. No evidence of crepitus is felt.   Lymphadenopathy:    He has no cervical adenopathy.  Neurological: He is alert and oriented to person, place, and time. No cranial nerve deficit.  Skin: Skin is warm and dry. He is not diaphoretic.  Psychiatric: He has a normal mood and affect. His behavior is normal. Judgment and thought content normal.  Nursing note and vitals reviewed.  Assessment/Plan: 1. Closed fracture of one rib of left side with routine healing, subsequent encounter Reviewed records and progress notes ffrom recent ER visit. Will complete short term disability paperwork for him and return as soon as possible.   2. Gastroesophageal reflux disease without esophagitis - pantoprazole (PROTONIX) 40 MG  tablet; Take 1 tablet (40 mg total) by mouth daily.  Dispense: 90 tablet; Refill: 4  3. Essential hypertension, benign Stable. Continue bp mediicatin as prescribed.   General Counseling: haskel dewalt understanding of the findings of todays visit and agrees with plan of treatment. I have discussed any further diagnostic evaluation that may be needed or ordered today. We also reviewed his medications today. he has been encouraged to call the office with any questions or concerns that should arise related to todays visit.  This patient was seen by Leretha Pol, FNP- C in Collaboration with Dr Lavera Guise as a part of collaborative care agreement    Meds ordered this encounter  Medications  . pantoprazole (PROTONIX) 40 MG tablet    Sig: Take 1 tablet (40 mg total) by mouth daily.    Dispense:  90 tablet    Refill:  4    Order Specific Question:   Supervising Provider    Answer:   Lavera Guise [[id number]]     Time spent: 72 Minutes      Dr Lavera Guise Internal medicine

## 2017-06-05 ENCOUNTER — Telehealth: Payer: Self-pay

## 2017-06-05 NOTE — Telephone Encounter (Signed)
Sent message to [employer]

## 2017-06-11 NOTE — Telephone Encounter (Signed)
I was trying to fill out this paperwork for him, and it's asking a lot of questions regarding disability. And when he became totally disabled and why. I have never filled this out for him in the past. Can you call him and ask him these questions? If you can put the answers on notebook paper I can transfer them over to the form. Thanks

## 2017-06-13 ENCOUNTER — Telehealth: Payer: Self-pay

## 2017-06-13 NOTE — Telephone Encounter (Signed)
lmom that paperwork for disabilty paper  Is ready for pickup

## 2017-06-24 DIAGNOSIS — S2232XD Fracture of one rib, left side, subsequent encounter for fracture with routine healing: Secondary | ICD-10-CM | POA: Insufficient documentation

## 2017-06-24 DIAGNOSIS — K219 Gastro-esophageal reflux disease without esophagitis: Secondary | ICD-10-CM | POA: Insufficient documentation

## 2017-07-12 ENCOUNTER — Ambulatory Visit (INDEPENDENT_AMBULATORY_CARE_PROVIDER_SITE_OTHER): Payer: Medicare Other | Admitting: Nurse Practitioner

## 2017-07-12 ENCOUNTER — Encounter: Payer: Self-pay | Admitting: Nurse Practitioner

## 2017-07-12 VITALS — BP 130/66 | HR 88 | Resp 16 | Ht 74.5 in | Wt 315.6 lb

## 2017-07-12 DIAGNOSIS — S2232XD Fracture of one rib, left side, subsequent encounter for fracture with routine healing: Secondary | ICD-10-CM

## 2017-07-12 DIAGNOSIS — R300 Dysuria: Secondary | ICD-10-CM

## 2017-07-12 DIAGNOSIS — Z0001 Encounter for general adult medical examination with abnormal findings: Secondary | ICD-10-CM

## 2017-07-12 DIAGNOSIS — I10 Essential (primary) hypertension: Secondary | ICD-10-CM

## 2017-07-12 NOTE — Progress Notes (Signed)
[facility] [place],  [address]  Internal MEDICINE  Office Visit Note  Patient Name: Samuel Frank  [id number]  [id number]  Date of Service: 07/13/2017    Pt is here for routine health maintenance examination   Chief Complaint  Patient presents with  . Annual Exam  . Hypertension     Hypertension  This is a chronic problem. The current episode started more than 1 year ago. The problem is unchanged. The problem is controlled. Associated symptoms include chest pain. Pertinent negatives include no headaches, neck pain, palpitations or shortness of breath. There are no associated agents to hypertension. Risk factors for coronary artery disease include male gender and obesity. Past treatments include angiotensin blockers and diuretics. The current treatment provides moderate improvement. Compliance problems include exercise.    Current Medication: Outpatient Encounter Medications as of 07/12/2017  Medication Sig  . aspirin EC 81 MG tablet Take 81 mg by mouth daily.  . cyanocobalamin 500 MCG tablet Take 500 mcg by mouth daily.  [name] HYDROcodone-acetaminophen (NORCO) 5-325 MG tablet Take 1 tablet by mouth every 6 (six) hours as needed for up to 15 doses for severe pain.  [name] ibuprofen (ADVIL,MOTRIN) 200 MG tablet Take by mouth.  . pantoprazole (PROTONIX) 40 MG tablet Take 1 tablet (40 mg total) by mouth daily.  [name] tiZANidine (ZANAFLEX) 4 MG tablet Take by mouth.  . valsartan-hydrochlorothiazide (DIOVAN-HCT) 320-25 MG tablet Take 1 tablet by mouth daily.   No facility-administered encounter medications on file as of 07/12/2017.     Surgical History: Past Surgical History:  Procedure Laterality Date  . COLONOSCOPY WITH PROPOFOL N/A 05/03/2015   Procedure: COLONOSCOPY WITH PROPOFOL;  Surgeon: [name], MD;  Location: [facility] ENDOSCOPY;  Service: Gastroenterology;  Laterality: N/A;  . ESOPHAGOGASTRODUODENOSCOPY (EGD) WITH PROPOFOL N/A 05/03/2015   Procedure:  ESOPHAGOGASTRODUODENOSCOPY (EGD) WITH PROPOFOL;  Surgeon: [name], MD;  Location: [facility] ENDOSCOPY;  Service: Gastroenterology;  Laterality: N/A;  . HERNIA REPAIR     1975    Medical History: Past Medical History:  Diagnosis Date  . Anemia   . Hypertension     Family History: Family History  Problem Relation Age of Onset  . Alzheimer's disease Father       Review of Systems  Constitutional: Negative for activity change, chills, fatigue and unexpected weight change.  HENT: Negative for congestion, postnasal drip, rhinorrhea, sneezing and sore throat.   Eyes: Negative.  Negative for redness.  Respiratory: Negative for cough, chest tightness, shortness of breath and wheezing.   Cardiovascular: Positive for chest pain. Negative for palpitations.       Improved musculoskeletal pain on left side of the chest. Still tender to palpate.   Gastrointestinal: Negative for abdominal pain, constipation, diarrhea, nausea and vomiting.  Endocrine: Negative for cold intolerance, heat intolerance, polydipsia, polyphagia and polyuria.  Genitourinary: Negative for dysuria, frequency and urgency.  Musculoskeletal: Positive for arthralgias and myalgias. Negative for back pain, joint swelling and neck pain.  Skin: Negative for rash.  Allergic/Immunologic: Negative for environmental allergies.  Neurological: Negative for dizziness, tremors, numbness and headaches.  Hematological: Negative for adenopathy. Does not bruise/bleed easily.  Psychiatric/Behavioral: Negative for behavioral problems (Depression), sleep disturbance and suicidal ideas. The patient is not nervous/anxious.      Today's Vitals   07/12/17 1001  BP: 130/66  Pulse: 88  Resp: 16  SpO2: 94%  Weight: (!) 315 lb 9.6 oz (143.2 kg)  Height: 6' 2.5" (1.892 m)  Physical Exam  Constitutional: He is oriented to person, place, and time. He appears well-developed and well-nourished. No distress.  HENT:  Head: Normocephalic and  atraumatic.  Nose: Nose normal.  Mouth/Throat: Oropharynx is clear and moist. No oropharyngeal exudate.  Eyes: Pupils are equal, round, and reactive to light. Conjunctivae and EOM are normal.  Neck: Normal range of motion. Neck supple. No JVD present. Carotid bruit is not present. No tracheal deviation present. No thyromegaly present.  Cardiovascular: Normal rate, regular rhythm, normal heart sounds and intact distal pulses. Exam reveals no gallop and no friction rub.  No murmur heard. Mild, rib cage pain, left lower part of the chest. More severe with moderate palpation and movement.   Pulmonary/Chest: Effort normal and breath sounds normal. No respiratory distress. He has no wheezes. He has no rales. He exhibits no tenderness.  Tenderness without swelling or bruising present.   Abdominal: Soft. Bowel sounds are normal. There is no tenderness.  Musculoskeletal: Normal range of motion.  Tenderness along the left lower rib cage without swelling or bruising present. No evidence of crepitus is felt.   Lymphadenopathy:    He has no cervical adenopathy.  Neurological: He is alert and oriented to person, place, and time. No cranial nerve deficit.  Skin: Skin is warm and dry. Capillary refill takes 2 to 3 seconds. He is not diaphoretic.  Psychiatric: He has a normal mood and affect. His behavior is normal. Judgment and thought content normal.  Nursing note and vitals reviewed.  Assessment/Plan: 1. Encounter for general adult medical examination with abnormal findings Annual wellness visit today.   2. Essential hypertension, benign Well-controlled. Continue bp medicatin as prescribed   3. Closed fracture of one rib of left side with routine healing, subsequent encounter Marked improvement. Continue to monitor.   4. Dysuria - Urinalysis, Routine w reflex microscopic  General Counseling: rishon thilges understanding of the findings of todays visit and agrees with plan of treatment. I have  discussed any further diagnostic evaluation that may be needed or ordered today. We also reviewed his medications today. he has been encouraged to call the office with any questions or concerns that should arise related to todays visit.   Hypertension Counseling:   The following hypertensive lifestyle modification were recommended and discussed:  1. Limiting alcohol intake to less than 1 oz/day of ethanol:(24 oz of beer or 8 oz of wine or 2 oz of 100-proof whiskey). 2. Take baby ASA 81 mg daily. 3. Importance of regular aerobic exercise and losing weight. 4. Reduce dietary saturated fat and cholesterol intake for overall cardiovascular health. 5. Maintaining adequate dietary potassium, calcium, and magnesium intake. 6. Regular monitoring of the blood pressure. 7. Reduce sodium intake to less than 100 mmol/day (less than 2.3 gm of sodium or less than 6 gm of sodium choride)   This patient was seen by Leretha Pol, FNP- C in Collaboration with Dr Lavera Guise as a part of collaborative care agreement    Orders Placed This Encounter  Procedures  . Urinalysis, Routine w reflex microscopic      Time spent: [place], MD  Internal Medicine

## 2017-07-13 DIAGNOSIS — R300 Dysuria: Secondary | ICD-10-CM | POA: Insufficient documentation

## 2017-07-13 DIAGNOSIS — Z0001 Encounter for general adult medical examination with abnormal findings: Secondary | ICD-10-CM | POA: Insufficient documentation

## 2017-07-13 LAB — URINALYSIS, ROUTINE W REFLEX MICROSCOPIC
Bilirubin, UA: NEGATIVE
Glucose, UA: NEGATIVE
Ketones, UA: NEGATIVE
Leukocytes, UA: NEGATIVE
Nitrite, UA: NEGATIVE
Protein, UA: NEGATIVE
RBC, UA: NEGATIVE
Specific Gravity, UA: 1.018 (ref 1.005–1.030)
Urobilinogen, Ur: 0.2 mg/dL (ref 0.2–1.0)
pH, UA: 5.5 (ref 5.0–7.5)

## 2017-08-24 ENCOUNTER — Other Ambulatory Visit: Payer: Self-pay | Admitting: Internal Medicine

## 2017-08-29 ENCOUNTER — Telehealth: Payer: Self-pay

## 2017-08-29 ENCOUNTER — Other Ambulatory Visit: Payer: Self-pay

## 2017-08-29 MED ORDER — VALSARTAN-HYDROCHLOROTHIAZIDE 320-25 MG PO TABS: 1.0000 | ORAL_TABLET | Freq: Every day | ORAL | 2 refills | Status: DC

## 2017-08-29 NOTE — Telephone Encounter (Signed)
Send again his bp med to [employer] [facility]

## 2017-09-06 ENCOUNTER — Other Ambulatory Visit: Payer: Self-pay

## 2017-09-06 ENCOUNTER — Emergency Department: Payer: Medicare Other

## 2017-09-06 ENCOUNTER — Emergency Department
Admission: EM | Admit: 2017-09-06 | Discharge: 2017-09-06 | Disposition: A | Discharge status: Home / Self Care | Payer: Medicare Other | Attending: Emergency Medicine | Admitting: Emergency Medicine

## 2017-09-06 ENCOUNTER — Encounter: Payer: Self-pay | Admitting: Emergency Medicine

## 2017-09-06 DIAGNOSIS — Z7982 Long term (current) use of aspirin: Secondary | ICD-10-CM | POA: Insufficient documentation

## 2017-09-06 DIAGNOSIS — M7122 Synovial cyst of popliteal space [Baker], left knee: Secondary | ICD-10-CM | POA: Diagnosis not present

## 2017-09-06 DIAGNOSIS — I10 Essential (primary) hypertension: Secondary | ICD-10-CM | POA: Insufficient documentation

## 2017-09-06 DIAGNOSIS — M25462 Effusion, left knee: Secondary | ICD-10-CM | POA: Diagnosis not present

## 2017-09-06 DIAGNOSIS — Z79899 Other long term (current) drug therapy: Secondary | ICD-10-CM | POA: Insufficient documentation

## 2017-09-06 DIAGNOSIS — M25562 Pain in left knee: Secondary | ICD-10-CM | POA: Diagnosis not present

## 2017-09-06 MED ORDER — MELOXICAM 15 MG PO TABS: 15.0000 mg | ORAL_TABLET | Freq: Every day | ORAL | 2 refills | Status: AC

## 2017-09-06 NOTE — ED Provider Notes (Signed)
[facility] Emergency Department Provider Note  ____________________________________________   First MD Initiated Contact with Patient 09/06/17 1915     (approximate)  I have reviewed the triage vital signs and the nursing notes.   HISTORY  Chief Complaint Knee Pain    HPI Samuel Frank. is a 77 y.o. male presents to the emergency department complaining of left knee pain that started last night.  He states he got up in the middle the night to go the bathroom and pain started.  He states he is been unable to walk without help since then.  He denies any other injuries.  Past Medical History:  Diagnosis Date  . Anemia   . Hypertension     Patient Active Problem List   Diagnosis Date Noted  . Encounter for general adult medical examination with abnormal findings 07/13/2017  . Dysuria 07/13/2017  . Closed fracture of rib of left side with routine healing 06/24/2017  . Gastroesophageal reflux disease without esophagitis 06/24/2017  . Essential hypertension, benign 06/24/2017    Past Surgical History:  Procedure Laterality Date  . COLONOSCOPY WITH PROPOFOL N/A 05/03/2015   Procedure: COLONOSCOPY WITH PROPOFOL;  Surgeon: [name], MD;  Location: [facility] ENDOSCOPY;  Service: Gastroenterology;  Laterality: N/A;  . ESOPHAGOGASTRODUODENOSCOPY (EGD) WITH PROPOFOL N/A 05/03/2015   Procedure: ESOPHAGOGASTRODUODENOSCOPY (EGD) WITH PROPOFOL;  Surgeon: [name], MD;  Location: [facility] ENDOSCOPY;  Service: Gastroenterology;  Laterality: N/A;  . [place]    Prior to Admission medications   Medication Sig Start Date End Date Taking? Authorizing Provider  aspirin EC 81 MG tablet Take 81 mg by mouth daily.    [provider]  cyanocobalamin 500 MCG tablet Take 500 mcg by mouth daily.    [provider]  HYDROcodone-acetaminophen (NORCO) 5-325 MG tablet Take 1 tablet by mouth every 6 (six) hours as needed for up to 15 doses for severe  pain. 05/06/17   Darel Hong, MD  ibuprofen (ADVIL,MOTRIN) 200 MG tablet Take by mouth.    [provider]  meloxicam (MOBIC) 15 MG tablet Take 1 tablet (15 mg total) by mouth daily. 09/06/17 09/06/18  Fisher, Linden Dolin, PA-C  pantoprazole (PROTONIX) 40 MG tablet Take 1 tablet (40 mg total) by mouth daily. 05/31/17   Ronnell Freshwater, NP  tiZANidine (ZANAFLEX) 4 MG tablet Take by mouth. 03/13/17   [provider]  valsartan-hydrochlorothiazide (DIOVAN-HCT) 320-25 MG tablet Take 1 tablet by mouth daily. 08/29/17   Ronnell Freshwater, NP    Allergies Patient has no known allergies.  Family History  Problem Relation Age of Onset  . Alzheimer's disease Father     Social History Social History   Tobacco Use  . Smoking status: Never Smoker  . Smokeless tobacco: Never Used  Substance Use Topics  . Alcohol use: No  . Drug use: No    Review of Systems  Constitutional: No fever/chills Eyes: No visual changes. ENT: No sore throat. Respiratory: Denies cough Genitourinary: Negative for dysuria. Musculoskeletal: Negative for back pain.  Positive for left knee pain Skin: Negative for rash.    ____________________________________________   PHYSICAL EXAM:  VITAL SIGNS: ED Triage Vitals  Enc Vitals Group     BP 09/06/17 1856 (!) 143/61     Pulse Rate 09/06/17 1856 100     Resp 09/06/17 1856 14     Temp 09/06/17 1856 98.6 F (37 C)     Temp Source 09/06/17  1856 Oral     SpO2 09/06/17 1856 96 %     Weight 09/06/17 1857 (!) 323 lb (146.5 kg)     Height 09/06/17 1857 6\' 2"  (1.88 m)     Head Circumference --      Peak Flow --      Pain Score 09/06/17 1857 8     Pain Loc --      Pain Edu? --      Excl. in [place]? --     Constitutional: Alert and oriented. Well appearing and in no acute distress. Eyes: Conjunctivae are normal.  Head: Atraumatic. Nose: No congestion/rhinnorhea. Mouth/Throat: Mucous membranes are moist.   Cardiovascular: Normal rate, regular  rhythm. Respiratory: Normal respiratory effort.  No retractions GU: deferred Musculoskeletal: Decreased range of motion of the left knee.  Patient is unable to bend the knee.  He states there is a pulling in the back of the knee.  There is also a concave appearance on the quadricep area.  The left knee is also warm to touch with a mild amount of swelling.  Neurovascular is intact Neurologic:  Normal speech and language.  Skin:  Skin is warm, dry and intact. No rash noted. Psychiatric: Mood and affect are normal. Speech and behavior are normal.  ____________________________________________   LABS (all labs ordered are listed, but only abnormal results are displayed)  Labs Reviewed - No data to display ____________________________________________   ____________________________________________  RADIOLOGY  X-ray of the left knee is negative for any acute abnormality CT of the left knee shows a Baker's cyst and knee effusion, the quadriceps are intact  ____________________________________________   PROCEDURES  Procedure(s) performed: Knee immobilizer was applied by the tech  Procedures    ____________________________________________   INITIAL IMPRESSION / ASSESSMENT AND PLAN / ED COURSE  Pertinent labs & imaging results that were available during my care of the patient were reviewed by me and considered in my medical decision making (see chart for details).  Patient is 77 year old male presents emergency department complaining of left knee pain.  He states he was able to walk yesterday but got up in the middle the night to go the bathroom and suddenly he cannot walk.  He states the pain is at his knee and behind his knee.  On physical exam the left knee is swollen, tender, and warm to touch.  There is a concave appearance in the quadriceps of the left thigh.  The patient is unable to bend the knee without help.  He is neurovascularly intact  X-ray of the left knee is  negative for any acute abnormality CT of the left knee is a Baker's cyst and joint effusion, no muscle or tendon injury  Discussed the CT and x-ray findings with the patient and his daughter.  He was placed in a knee immobilizer and given a walker.  He is to elevate and ice the knee.  Follow-up with orthopedics if not better in 3 to 5 days.  He is to avoid working out in the heat such as mowing the grass at this time.  He was given a prescription for meloxicam 15 mg daily.  He is to return to emergency department if worsening.  He was discharged in stable condition in the care of his daughter.     As part of my medical decision making, I reviewed the following data within the [place] History obtained from family, Nursing notes reviewed and incorporated, Old chart reviewed, Radiograph reviewed x-ray left  knee is negative for any acute abnormality, CT of the left knee shows a Baker's cyst and joint effusion and muscles and tendons are intact., Notes from prior ED visits and [place] Controlled Substance Database  ____________________________________________   FINAL CLINICAL IMPRESSION(S) / ED DIAGNOSES  Final diagnoses:  Effusion of left knee  Baker's cyst of knee, left      NEW MEDICATIONS STARTED DURING THIS VISIT:  Discharge Medication List as of 09/06/2017  8:43 PM    START taking these medications   Details  meloxicam (MOBIC) 15 MG tablet Take 1 tablet (15 mg total) by mouth daily., Starting Thu 09/06/2017, Until Fri 09/06/2018, Print         Note:  This document was prepared using Dragon voice recognition software and may include unintentional dictation errors.    Versie Starks, PA-C 09/06/17 2233    Delman Kitten, MD 09/07/17 [phone]

## 2017-09-06 NOTE — ED Notes (Signed)
Returned from CT.

## 2017-09-06 NOTE — Discharge Instructions (Addendum)
Follow-up with your regular doctor or Dr. Posey Pronto if not better in 3 to 5 days.  Apply ice to the knee as often as possible.  Wear the knee immobilizer when you are up walking.  Use the walker for support.  Return to emergency department if your worsening

## 2017-09-06 NOTE — ED Triage Notes (Signed)
Pt to ED via EMS from home c/o left knee pain that started last night and noticed pain when getting up in the middle of the night going to the bathroom.  States was working out in the yard yesterday but denies injury or previous hx with knee.  Denies SOB.  (+) pedal pulses bilaterally.

## 2017-09-07 ENCOUNTER — Other Ambulatory Visit: Payer: Self-pay

## 2017-09-11 DIAGNOSIS — M1712 Unilateral primary osteoarthritis, left knee: Secondary | ICD-10-CM | POA: Diagnosis not present

## 2018-01-15 ENCOUNTER — Ambulatory Visit (INDEPENDENT_AMBULATORY_CARE_PROVIDER_SITE_OTHER): Payer: Medicare Other | Admitting: Nurse Practitioner

## 2018-01-15 ENCOUNTER — Encounter: Payer: Self-pay | Admitting: Nurse Practitioner

## 2018-01-15 VITALS — BP 132/75 | HR 92 | Resp 16 | Ht 74.5 in | Wt 316.4 lb

## 2018-01-15 DIAGNOSIS — I10 Essential (primary) hypertension: Secondary | ICD-10-CM | POA: Diagnosis not present

## 2018-01-15 DIAGNOSIS — K219 Gastro-esophageal reflux disease without esophagitis: Secondary | ICD-10-CM | POA: Diagnosis not present

## 2018-01-15 DIAGNOSIS — Z23 Encounter for immunization: Secondary | ICD-10-CM

## 2018-01-15 NOTE — Progress Notes (Signed)
[facility] [address], [place] [address]  Internal MEDICINE  Office Visit Note  Patient Name: Samuel Frank  [id number]  [id number]  Date of Service: 01/16/2018  Chief Complaint  Patient presents with  . Medicare Wellness    6 month follow up  . Hypertension    The patient presents for routine follow up visit. Blood pressure is well controlled. He is due for Prevnar 13.  A prescription is sent today for administration of this vaccine. He has brought with him, application for handicap plackard for his car. 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 He also provides transportation for family member who ha multiple orthopedic and metabolic disorders.   Hypertension  This is a chronic problem. The current episode started more than 1 year ago. The problem is unchanged. The problem is controlled. Pertinent negatives include no chest pain, headaches, neck pain, palpitations or shortness of breath. There are no associated agents to hypertension. Risk factors for coronary artery disease include male gender and obesity. Past treatments include angiotensin blockers and diuretics. The current treatment provides moderate improvement. Compliance problems include diet.        Current Medication: Outpatient Encounter Medications as of 01/15/2018  Medication Sig  . aspirin EC 81 MG tablet Take 81 mg by mouth daily.  . pantoprazole (PROTONIX) 40 MG tablet Take 1 tablet (40 mg total) by mouth daily.  . valsartan-hydrochlorothiazide (DIOVAN-HCT) 320-25 MG tablet Take 1 tablet by mouth daily.  . cyanocobalamin 500 MCG tablet Take 500 mcg by mouth daily.  [name] HYDROcodone-acetaminophen (NORCO) 5-325 MG tablet Take 1 tablet by mouth every 6 (six) hours as needed for up to 15 doses for severe pain. (Patient not taking: Reported on 01/15/2018)  . ibuprofen (ADVIL,MOTRIN) 200 MG tablet Take by mouth.  . meloxicam (MOBIC) 15  MG tablet Take 1 tablet (15 mg total) by mouth daily. (Patient not taking: Reported on 01/15/2018)  . tiZANidine (ZANAFLEX) 4 MG tablet Take by mouth.   No facility-administered encounter medications on file as of 01/15/2018.     Surgical History: Past Surgical History:  Procedure Laterality Date  . COLONOSCOPY WITH PROPOFOL N/A 05/03/2015   Procedure: COLONOSCOPY WITH PROPOFOL;  Surgeon: [name], MD;  Location: [facility] ENDOSCOPY;  Service: Gastroenterology;  Laterality: N/A;  . ESOPHAGOGASTRODUODENOSCOPY (EGD) WITH PROPOFOL N/A 05/03/2015   Procedure: ESOPHAGOGASTRODUODENOSCOPY (EGD) WITH PROPOFOL;  Surgeon: [name], MD;  Location: [facility] ENDOSCOPY;  Service: Gastroenterology;  Laterality: N/A;  . HERNIA REPAIR     1975    Medical History: Past Medical History:  Diagnosis Date  . Anemia   . Hypertension     Family History: Family History  Problem Relation Age of Onset  . Alzheimer's disease Father     Social History   Socioeconomic History  . Marital status: Divorced    Spouse name: Not on file  . Number of children: Not on file  . Years of education: Not on file  . Highest education level: Not on file  Occupational History  . Not on file  Social Needs  . Financial resource strain: Not on file  . Food insecurity:    Worry: Not on file    Inability: Not on file  . Transportation needs:    Medical: Not on file    Non-medical: Not on file  Tobacco Use  . Smoking status: Never Smoker  . Smokeless tobacco: Never Used  Substance and  Sexual Activity  . Alcohol use: No  . Drug use: No  . Sexual activity: Not on file  Lifestyle  . Physical activity:    Days per week: Not on file    Minutes per session: Not on file  . Stress: Not on file  Relationships  . Social connections:    Talks on phone: Not on file    Gets together: Not on file    Attends religious service: Not on file    Active member of club or organization: Not on file    Attends meetings of clubs or  organizations: Not on file    Relationship status: Not on file  . Intimate partner violence:    Fear of current or ex partner: Not on file    Emotionally abused: Not on file    Physically abused: Not on file    Forced sexual activity: Not on file  Other Topics Concern  . Not on file  Social History Narrative  . Not on file      Review of Systems  Constitutional: Negative for chills, fatigue and unexpected weight change.  HENT: Negative for congestion, postnasal drip, rhinorrhea, sneezing and sore throat.   Eyes: Negative for redness.  Respiratory: Negative for cough, chest tightness, shortness of breath and wheezing.   Cardiovascular: Negative for chest pain and palpitations.  Gastrointestinal: Negative for abdominal pain, constipation, diarrhea, nausea and vomiting.  Endocrine: Negative for cold intolerance, heat intolerance, polydipsia, polyphagia and polyuria.  Genitourinary: Negative for dysuria and frequency.  Musculoskeletal: Negative for arthralgias, back pain, joint swelling and neck pain.  Skin: Negative for rash.  Allergic/Immunologic: Negative for environmental allergies.  Neurological: Negative for dizziness, tremors, numbness and headaches.  Hematological: Negative for adenopathy. Does not bruise/bleed easily.  Psychiatric/Behavioral: Negative for behavioral problems (Depression), sleep disturbance and suicidal ideas. The patient is not nervous/anxious.     Today's Vitals   01/15/18 1055  BP: 132/75  Pulse: 92  Resp: 16  SpO2: 96%  Weight: (!) 316 lb 6.4 oz (143.5 kg)  Height: 6' 2.5" (1.892 m)     Physical Exam  Constitutional: He is oriented to person, place, and time. He appears well-developed and well-nourished. No distress.  HENT:  Head: Normocephalic and atraumatic.  Mouth/Throat: No oropharyngeal exudate.  Eyes: Pupils are equal, round, and reactive to light. EOM are normal.  Neck: Normal range of motion. Neck supple. No JVD present. Carotid bruit  is not present. No tracheal deviation present. No thyromegaly present.  Cardiovascular: Normal rate, regular rhythm and normal heart sounds. Exam reveals no gallop and no friction rub.  No murmur heard. Pulmonary/Chest: Effort normal and breath sounds normal. No respiratory distress. He has no wheezes. He has no rales. He exhibits no tenderness.  Musculoskeletal: Normal range of motion.  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  Neurological: He is alert and oriented to person, place, and time. No cranial nerve deficit.  Skin: Skin is warm and dry. He is not diaphoretic.  Psychiatric: He has a normal mood and affect. His behavior is normal. Judgment and thought content normal.  Nursing note and vitals reviewed.  Assessment/Plan: 1. Essential hypertension, benign Well managed. Continue bp medication as prescribed.  2. Gastroesophageal reflux disease without esophagitis Continue use of pantoprazole as prescribed   3. Need for vaccination against Streptococcus pneumoniae using pneumococcal conjugate vaccine 13 rx for prevnar 13 vaccine sent to patient's pharmacy for administration.  -  Pneumococcal conjugate vaccine 13-valent IM  General Counseling: Samuel Frank understanding of the findings of todays visit and agrees with plan of treatment. I have discussed any further diagnostic evaluation that may be needed or ordered today. We also reviewed his medications today. he has been encouraged to call the office with any questions or concerns that should arise related to todays visit.  This patient was seen by Leretha Pol FNP Collaboration with Dr Lavera Guise as a part of collaborative care agreement  Orders Placed This Encounter  Procedures  . Pneumococcal conjugate vaccine [id number] IM   Application for handicap license plate placard was filled out and returned to the patient during this visit.    Time spent: [place] [facility]

## 2018-01-16 DIAGNOSIS — Z23 Encounter for immunization: Secondary | ICD-10-CM | POA: Insufficient documentation

## 2018-02-28 ENCOUNTER — Ambulatory Visit (INDEPENDENT_AMBULATORY_CARE_PROVIDER_SITE_OTHER): Payer: Medicare Other | Admitting: Adult Health

## 2018-02-28 ENCOUNTER — Encounter: Payer: Self-pay | Admitting: Adult Health

## 2018-02-28 VITALS — BP 126/76 | HR 97 | Resp 16 | Ht 74.5 in | Wt 313.8 lb

## 2018-02-28 DIAGNOSIS — I10 Essential (primary) hypertension: Secondary | ICD-10-CM

## 2018-02-28 DIAGNOSIS — N478 Other disorders of prepuce: Secondary | ICD-10-CM | POA: Diagnosis not present

## 2018-02-28 DIAGNOSIS — E669 Obesity, unspecified: Secondary | ICD-10-CM

## 2018-02-28 NOTE — Progress Notes (Signed)
[facility] [place], [place] [address]  Internal MEDICINE  Office Visit Note  Patient Name: Samuel Frank  [id number]  [id number]  Date of Service: 02/28/2018  Chief Complaint  Patient presents with  . Medical Management of Chronic Issues    pt want to discuss referral to urologist    HPI Pt reports he had polio as a child.  He reports his right hand was affected. Now, when he urinates he has difficulty retracting his foreskin with one hand and this is causing him to wet himself.  He would like to discuss circumcision with a urologist. He denies any pain, irritation, or infectious symptoms.     Current Medication: Outpatient Encounter Medications as of 02/28/2018  Medication Sig  . aspirin EC 81 MG tablet Take 81 mg by mouth daily.  . cyanocobalamin 500 MCG tablet Take 500 mcg by mouth daily.  [name] ibuprofen (ADVIL,MOTRIN) 200 MG tablet Take by mouth.  . pantoprazole (PROTONIX) 40 MG tablet Take 1 tablet (40 mg total) by mouth daily.  [name] tiZANidine (ZANAFLEX) 4 MG tablet Take by mouth.  . valsartan-hydrochlorothiazide (DIOVAN-HCT) 320-25 MG tablet Take 1 tablet by mouth daily.  [name] HYDROcodone-acetaminophen (NORCO) 5-325 MG tablet Take 1 tablet by mouth every 6 (six) hours as needed for up to 15 doses for severe pain. (Patient not taking: Reported on 01/15/2018)  . meloxicam (MOBIC) 15 MG tablet Take 1 tablet (15 mg total) by mouth daily. (Patient not taking: Reported on 01/15/2018)   No facility-administered encounter medications on file as of 02/28/2018.     Surgical History: Past Surgical History:  Procedure Laterality Date  . COLONOSCOPY WITH PROPOFOL N/A 05/03/2015   Procedure: COLONOSCOPY WITH PROPOFOL;  Surgeon: [name], MD;  Location: [facility] ENDOSCOPY;  Service: Gastroenterology;  Laterality: N/A;  . ESOPHAGOGASTRODUODENOSCOPY (EGD) WITH PROPOFOL N/A 05/03/2015   Procedure: ESOPHAGOGASTRODUODENOSCOPY (EGD) WITH PROPOFOL;  Surgeon: [name],  MD;  Location: [facility] ENDOSCOPY;  Service: Gastroenterology;  Laterality: N/A;  . HERNIA REPAIR     1975    Medical History: Past Medical History:  Diagnosis Date  . Anemia   . Hypertension     Family History: Family History  Problem Relation Age of Onset  . Alzheimer's disease Father     Social History   Socioeconomic History  . Marital status: Divorced    Spouse name: Not on file  . Number of children: Not on file  . Years of education: Not on file  . Highest education level: Not on file  Occupational History  . Not on file  Social Needs  . Financial resource strain: Not on file  . Food insecurity:    Worry: Not on file    Inability: Not on file  . Transportation needs:    Medical: Not on file    Non-medical: Not on file  Tobacco Use  . Smoking status: Never Smoker  . Smokeless tobacco: Never Used  Substance and Sexual Activity  . Alcohol use: No  . Drug use: No  . Sexual activity: Not on file  Lifestyle  . Physical activity:    Days per week: Not on file    Minutes per session: Not on file  . Stress: Not on file  Relationships  . Social connections:    Talks on phone: Not on file    Gets together: Not on file    Attends religious service: Not on file    Active member of club or organization:  Not on file    Attends meetings of clubs or organizations: Not on file    Relationship status: Not on file  . Intimate partner violence:    Fear of current or ex partner: Not on file    Emotionally abused: Not on file    Physically abused: Not on file    Forced sexual activity: Not on file  Other Topics Concern  . Not on file  Social History Narrative  . Not on file      Review of Systems  Constitutional: Negative.  Negative for chills, fatigue and unexpected weight change.  HENT: Negative.  Negative for congestion, rhinorrhea, sneezing and sore throat.   Eyes: Negative for redness.  Respiratory: Negative.  Negative for cough, chest tightness and shortness  of breath.   Cardiovascular: Negative.  Negative for chest pain and palpitations.  Gastrointestinal: Negative.  Negative for abdominal pain, constipation, diarrhea, nausea and vomiting.  Endocrine: Negative.   Genitourinary: Negative.  Negative for dysuria and frequency.  Musculoskeletal: Negative.  Negative for arthralgias, back pain, joint swelling and neck pain.  Skin: Negative.  Negative for rash.  Allergic/Immunologic: Negative.   Neurological: Negative.  Negative for tremors and numbness.  Hematological: Negative for adenopathy. Does not bruise/bleed easily.  Psychiatric/Behavioral: Negative.  Negative for behavioral problems, sleep disturbance and suicidal ideas. The patient is not nervous/anxious.     Vital Signs: BP 126/76 (BP Location: Left Arm, Patient Position: Sitting, Cuff Size: Large)   Pulse 97   Resp 16   Ht 6' 2.5" (1.892 m)   Wt (!) 313 lb 12.8 oz (142.3 kg)   SpO2 97%   BMI 39.75 kg/m    Physical Exam Vitals signs and nursing note reviewed.  Constitutional:      General: He is not in acute distress.    Appearance: He is well-developed. He is not diaphoretic.  HENT:     Head: Normocephalic and atraumatic.     Mouth/Throat:     Pharynx: No oropharyngeal exudate.  Eyes:     Pupils: Pupils are equal, round, and reactive to light.  Neck:     Musculoskeletal: Normal range of motion and neck supple.     Thyroid: No thyromegaly.     Vascular: No JVD.     Trachea: No tracheal deviation.  Cardiovascular:     Rate and Rhythm: Normal rate and regular rhythm.     Heart sounds: Normal heart sounds. No murmur. No friction rub. No gallop.   Pulmonary:     Effort: Pulmonary effort is normal. No respiratory distress.     Breath sounds: Normal breath sounds. No wheezing or rales.  Chest:     Chest wall: No tenderness.  Abdominal:     Palpations: Abdomen is soft.     Tenderness: There is no abdominal tenderness. There is no guarding.  Musculoskeletal: Normal range  of motion.  Lymphadenopathy:     Cervical: No cervical adenopathy.  Skin:    General: Skin is warm and dry.  Neurological:     Mental Status: He is alert and oriented to person, place, and time.     Cranial Nerves: No cranial nerve deficit.  Psychiatric:        Behavior: Behavior normal.        Thought Content: Thought content normal.        Judgment: Judgment normal.    Assessment/Plan: 1. Foreskin problem Will send patient to Urology for consultation.  - Ambulatory referral to Urology  2. Essential  hypertension, benign Stable, continue current medications.    3. Obesity (BMI 35.0-39.9 without comorbidity) Obesity Counseling: Risk Assessment: An assessment of behavioral risk factors was made today and includes lack of exercise sedentary lifestyle, lack of portion control and poor dietary habits.  Risk Modification Advice: She was counseled on portion control guidelines. Restricting daily caloric intake to. . The detrimental long term effects of obesity on her health and ongoing poor compliance was also discussed with the patient.    General Counseling: jhett fretwell understanding of the findings of todays visit and agrees with plan of treatment. I have discussed any further diagnostic evaluation that may be needed or ordered today. We also reviewed his medications today. he has been encouraged to call the office with any questions or concerns that should arise related to todays visit.    Orders Placed This Encounter  Procedures  . Ambulatory referral to Urology    No orders of the defined types were placed in this encounter.   Time spent: 25 Minutes   This patient was seen by Samuel Frank AGNP-C in Collaboration with Dr Lavera Guise as a part of collaborative care agreement     Samuel Frank AGNP-C Internal medicine

## 2018-03-21 DIAGNOSIS — M25532 Pain in left wrist: Secondary | ICD-10-CM | POA: Diagnosis not present

## 2018-03-21 DIAGNOSIS — M25432 Effusion, left wrist: Secondary | ICD-10-CM | POA: Diagnosis not present

## 2018-03-22 ENCOUNTER — Telehealth: Payer: Self-pay

## 2018-03-22 NOTE — Telephone Encounter (Signed)
[facility] office called pt WBC is high tat left message pt need appt for discuss labs done by [facility]

## 2018-03-25 ENCOUNTER — Ambulatory Visit (INDEPENDENT_AMBULATORY_CARE_PROVIDER_SITE_OTHER): Payer: Medicare Other | Admitting: Nurse Practitioner

## 2018-03-25 ENCOUNTER — Encounter: Payer: Self-pay | Admitting: Nurse Practitioner

## 2018-03-25 VITALS — BP 137/72 | HR 78 | Resp 16 | Ht 74.0 in | Wt 313.0 lb

## 2018-03-25 DIAGNOSIS — D72829 Elevated white blood cell count, unspecified: Secondary | ICD-10-CM

## 2018-03-25 DIAGNOSIS — M109 Gout, unspecified: Secondary | ICD-10-CM | POA: Diagnosis not present

## 2018-03-25 DIAGNOSIS — I10 Essential (primary) hypertension: Secondary | ICD-10-CM

## 2018-03-25 MED ORDER — AMOXICILLIN 875 MG PO TABS: 875.0000 mg | ORAL_TABLET | Freq: Two times a day (BID) | ORAL | 0 refills | Status: DC

## 2018-03-25 NOTE — Progress Notes (Signed)
[facility] [place],  [address]  Internal MEDICINE  Office Visit Note  Patient Name: Samuel Frank  [id number]  [id number]  Date of Service: 03/25/2018  Chief Complaint  Patient presents with  . Hyperlipidemia  . Gastroesophageal Reflux  . Follow-up    labs from other MD     The patient is here for follow up visit. He recently saw orthopedic provider due to pain in left wrist and hand. He had labs drawn which indicated he had gout as well as his white blood cell count being elevated at 22. He was given a prednisone taper to help with inflammation, but was asked to follow up with his primary care provider for the elevation in WBC. He denies chest pain, shortness of breath, cough, or congestion. Has had no abdominal pain, vomiting or diarrhea. He denies fatigue, fever, or headache.       Current Medication: Outpatient Encounter Medications as of 03/25/2018  Medication Sig  . aspirin EC 81 MG tablet Take 81 mg by mouth daily.  . cyanocobalamin 500 MCG tablet Take 500 mcg by mouth daily.  [name] HYDROcodone-acetaminophen (NORCO) 5-325 MG tablet Take 1 tablet by mouth every 6 (six) hours as needed for up to 15 doses for severe pain.  [name] ibuprofen (ADVIL,MOTRIN) 200 MG tablet Take by mouth.  . meloxicam (MOBIC) 15 MG tablet Take 1 tablet (15 mg total) by mouth daily.  . pantoprazole (PROTONIX) 40 MG tablet Take 1 tablet (40 mg total) by mouth daily.  [name] tiZANidine (ZANAFLEX) 4 MG tablet Take by mouth.  . valsartan-hydrochlorothiazide (DIOVAN-HCT) 320-25 MG tablet Take 1 tablet by mouth daily.  [name] amoxicillin (AMOXIL) 875 MG tablet Take 1 tablet (875 mg total) by mouth 2 (two) times daily.   No facility-administered encounter medications on file as of 03/25/2018.     Surgical History: Past Surgical History:  Procedure Laterality Date  . COLONOSCOPY WITH PROPOFOL N/A 05/03/2015   Procedure: COLONOSCOPY WITH PROPOFOL;  Surgeon: [name], MD;  Location:  [facility] ENDOSCOPY;  Service: Gastroenterology;  Laterality: N/A;  . ESOPHAGOGASTRODUODENOSCOPY (EGD) WITH PROPOFOL N/A 05/03/2015   Procedure: ESOPHAGOGASTRODUODENOSCOPY (EGD) WITH PROPOFOL;  Surgeon: [name], MD;  Location: [facility] ENDOSCOPY;  Service: Gastroenterology;  Laterality: N/A;  . HERNIA REPAIR     1975    Medical History: Past Medical History:  Diagnosis Date  . Anemia   . Hypertension     Family History: Family History  Problem Relation Age of Onset  . Alzheimer's disease Father     Social History   Socioeconomic History  . Marital status: Divorced    Spouse name: Not on file  . Number of children: Not on file  . Years of education: Not on file  . Highest education level: Not on file  Occupational History  . Not on file  Social Needs  . Financial resource strain: Not on file  . Food insecurity:    Worry: Not on file    Inability: Not on file  . Transportation needs:    Medical: Not on file    Non-medical: Not on file  Tobacco Use  . Smoking status: Never Smoker  . Smokeless tobacco: Never Used  Substance and Sexual Activity  . Alcohol use: No  . Drug use: No  . Sexual activity: Not on file  Lifestyle  . Physical activity:    Days per week: Not on file    Minutes per session: Not on file  .  Stress: Not on file  Relationships  . Social connections:    Talks on phone: Not on file    Gets together: Not on file    Attends religious service: Not on file    Active member of club or organization: Not on file    Attends meetings of clubs or organizations: Not on file    Relationship status: Not on file  . Intimate partner violence:    Fear of current or ex partner: Not on file    Emotionally abused: Not on file    Physically abused: Not on file    Forced sexual activity: Not on file  Other Topics Concern  . Not on file  Social History Narrative  . Not on file      Review of Systems  Constitutional: Negative for activity change, chills, fatigue  and unexpected weight change.  HENT: Negative for congestion, postnasal drip, rhinorrhea, sneezing and sore throat.   Respiratory: Negative for cough, chest tightness, shortness of breath and wheezing.   Cardiovascular: Negative for chest pain and palpitations.  Gastrointestinal: Negative for abdominal pain, constipation, diarrhea, nausea and vomiting.  Endocrine: Negative for cold intolerance, heat intolerance, polydipsia and polyuria.  Genitourinary: Negative for dysuria and frequency.  Musculoskeletal: Positive for arthralgias. Negative for back pain, joint swelling and neck pain.       Left wrist and hand pain which has improved after treatment with prednisone taper.   Skin: Negative for rash.  Allergic/Immunologic: Negative for environmental allergies.  Neurological: Positive for weakness. Negative for dizziness, tremors, numbness and headaches.       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 Does not bruise/bleed easily.  Psychiatric/Behavioral: Negative for behavioral problems (Depression), sleep disturbance and suicidal ideas. The patient is not nervous/anxious.     Today's Vitals   03/25/18 1402  BP: 137/72  Pulse: 78  Resp: 16  SpO2: 99%  Weight: (!) 313 lb (142 kg)  Height: 6\' 2"  (1.88 m)    Physical Exam Vitals signs and nursing note reviewed.  Constitutional:      General: He is not in acute distress.    Appearance: Normal appearance. He is well-developed. He is not diaphoretic.  HENT:     Head: Normocephalic and atraumatic.     Mouth/Throat:     Pharynx: No oropharyngeal exudate.  Eyes:     Conjunctiva/sclera: Conjunctivae normal.     Pupils: Pupils are equal, round, and reactive to light.  Neck:     Musculoskeletal: Normal range of motion and neck supple.     Thyroid: No thyromegaly.     Vascular: No carotid bruit or JVD.     Trachea: No tracheal deviation.  Cardiovascular:     Rate and Rhythm:  Normal rate and regular rhythm.     Heart sounds: Normal heart sounds. No murmur. No friction rub. No gallop.   Pulmonary:     Effort: Pulmonary effort is normal. No respiratory distress.     Breath sounds: Normal breath sounds. No wheezing or rales.  Chest:     Chest wall: No tenderness.  Abdominal:     General: Bowel sounds are normal.     Tenderness: There is no abdominal tenderness.  Musculoskeletal: Normal range of motion.     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  Skin:    General: Skin is warm and dry.  Neurological:     Mental Status: He is alert and oriented to person, place, and time. Mental status is at baseline.     Cranial Nerves: No cranial nerve deficit.  Psychiatric:        Behavior: Behavior normal.        Thought Content: Thought content normal.        Judgment: Judgment normal.   Assessment/Plan: 1. Leukocytosis, unspecified type WBC of 22.4 at lab check last week. Will start amoxicillin 875mg  twice daiy for 10 days. Will recheck CBC after completion of antibiotics and refer/treat further as indicated.  - amoxicillin (AMOXIL) 875 MG tablet; Take 1 tablet (875 mg total) by mouth 2 (two) times daily.  Dispense: 20 tablet; Refill: 0  2. Essential hypertension, benign Stable. Continue bp medication as prescribed   3. Acute gouty arthritis Improved after completion of prednisone taper per orthopedics. Will continue to monitor.   General Counseling: gamal todisco understanding of the findings of todays visit and agrees with plan of treatment. I have discussed any further diagnostic evaluation that may be needed or ordered today. We also reviewed his medications today. he has been encouraged to call the office with any questions or concerns that should arise related to todays visit.  This patient was seen by [place] with Dr Lavera Guise as a part of  collaborative care agreement  Meds ordered this encounter  Medications  . amoxicillin (AMOXIL) 875 MG tablet    Sig: Take 1 tablet (875 mg total) by mouth 2 (two) times daily.    Dispense:  20 tablet    Refill:  0    Order Specific Question:   Supervising Provider    Answer:   Lavera Guise [[id number]]    Time spent: 72 Minutes      Dr Lavera Guise Internal medicine

## 2018-03-27 ENCOUNTER — Ambulatory Visit: Payer: Self-pay | Admitting: Urology

## 2018-04-10 ENCOUNTER — Other Ambulatory Visit: Payer: Self-pay | Admitting: Nurse Practitioner

## 2018-04-10 DIAGNOSIS — D72829 Elevated white blood cell count, unspecified: Secondary | ICD-10-CM | POA: Diagnosis not present

## 2018-04-11 LAB — CBC
HEMATOCRIT: 36.3 % — AB (ref 37.5–51.0)
HEMOGLOBIN: 11.4 g/dL — AB (ref 13.0–17.7)
MCH: 26 pg — ABNORMAL LOW (ref 26.6–33.0)
MCHC: 31.4 g/dL — AB (ref 31.5–35.7)
MCV: 83 fL (ref 79–97)
Platelets: 452 10*3/uL — ABNORMAL HIGH (ref 150–450)
RBC: 4.39 x10E6/uL (ref 4.14–5.80)
RDW: 17.8 % — AB (ref 11.6–15.4)
WBC: 14.5 10*3/uL — ABNORMAL HIGH (ref 3.4–10.8)

## 2018-04-23 ENCOUNTER — Telehealth: Payer: Self-pay

## 2018-04-23 NOTE — Telephone Encounter (Signed)
Please let him know that his white blood cell count is still elevated, however, it is MUCH better than the original check. I would like to check this again in few weeks and we can treat again with antibiotics if level is not continuing to improve. I will get lab slip ready for him.

## 2018-04-23 NOTE — Telephone Encounter (Signed)
Informed pt of results and also informed him that we are mailing a lab slip for him to repeat blood work in a few weeks per [employer].

## 2018-04-24 ENCOUNTER — Telehealth: Payer: Self-pay

## 2018-04-24 NOTE — Telephone Encounter (Signed)
PT CALLED TO CLARIFY IF HE NEEDED TO CONTINUE TAKING MEDICATION FOR ELEVATED WBC. LOOKED AT MESSAGE WITH TINA AND HEATHER AND HE IS AWARE THAT WE ARE MAILING HIM A LAB SLIP TO CHECK HIS WBC LEVELS. NOTIFIED PT THAT AFTER HE CHECKS HIS WBC LEVELS AGAIN THEN WE WILL DETERMINE IF HE NEEDS TO CONTINUE WITH MEDICATION.

## 2018-04-29 ENCOUNTER — Other Ambulatory Visit: Payer: Self-pay | Admitting: Nurse Practitioner

## 2018-04-29 DIAGNOSIS — D72829 Elevated white blood cell count, unspecified: Secondary | ICD-10-CM | POA: Diagnosis not present

## 2018-04-30 LAB — CBC
HEMATOCRIT: 35.3 % — AB (ref 37.5–51.0)
HEMOGLOBIN: 11.3 g/dL — AB (ref 13.0–17.7)
MCH: 26.2 pg — AB (ref 26.6–33.0)
MCHC: 32 g/dL (ref 31.5–35.7)
MCV: 82 fL (ref 79–97)
Platelets: 570 10*3/uL — ABNORMAL HIGH (ref 150–450)
RBC: 4.31 x10E6/uL (ref 4.14–5.80)
RDW: 18.2 % — ABNORMAL HIGH (ref 11.6–15.4)
WBC: 12.7 10*3/uL — ABNORMAL HIGH (ref 3.4–10.8)

## 2018-05-01 ENCOUNTER — Telehealth: Payer: Self-pay

## 2018-05-01 NOTE — Progress Notes (Signed)
PT WAS NOTIFIED. 

## 2018-05-01 NOTE — Telephone Encounter (Signed)
CALLED PT AND NOTIFIED HIM THAT HIS LABS ARE CONTINUING TO IMPROVE AND WILL CONTINUE TO MONITOR THEM PER HEATHER.

## 2018-05-17 ENCOUNTER — Other Ambulatory Visit: Payer: Self-pay

## 2018-05-17 MED ORDER — VALSARTAN-HYDROCHLOROTHIAZIDE 320-25 MG PO TABS: 1.0000 | ORAL_TABLET | Freq: Every day | ORAL | 2 refills | Status: DC

## 2018-05-20 ENCOUNTER — Other Ambulatory Visit: Payer: Self-pay

## 2018-05-20 MED ORDER — VALSARTAN-HYDROCHLOROTHIAZIDE 320-25 MG PO TABS: 1.0000 | ORAL_TABLET | Freq: Every day | ORAL | 2 refills | Status: DC

## 2018-07-16 ENCOUNTER — Other Ambulatory Visit: Payer: Self-pay

## 2018-07-16 ENCOUNTER — Encounter: Payer: Self-pay | Admitting: Nurse Practitioner

## 2018-07-16 ENCOUNTER — Ambulatory Visit (INDEPENDENT_AMBULATORY_CARE_PROVIDER_SITE_OTHER): Payer: Medicare Other | Admitting: Nurse Practitioner

## 2018-07-16 VITALS — BP 121/71 | HR 94 | Resp 16 | Ht 74.5 in | Wt 315.4 lb

## 2018-07-16 DIAGNOSIS — I10 Essential (primary) hypertension: Secondary | ICD-10-CM

## 2018-07-16 DIAGNOSIS — D72829 Elevated white blood cell count, unspecified: Secondary | ICD-10-CM

## 2018-07-16 DIAGNOSIS — Z0001 Encounter for general adult medical examination with abnormal findings: Secondary | ICD-10-CM

## 2018-07-16 DIAGNOSIS — M25532 Pain in left wrist: Secondary | ICD-10-CM | POA: Diagnosis not present

## 2018-07-16 NOTE — Progress Notes (Signed)
[facility] , [place] [address]  Internal MEDICINE  Office Visit Note  Patient Name: Samuel Frank  [id number]  [id number]  Date of Service: 07/16/2018  Chief Complaint  Patient presents with  . Medicare Wellness    medication refills  . Anemia  . Hypertension     The patient is here for health maintenance exam. He c/o intermittent left wrist pain with swelling. 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 ROM is currently intact. States pain will get worse from time to time. He states if he hits it on anything, it will get more swollen and very tender. He states that he is otherwise doing well. He has no chest pain, pressure, or shortness of breath. His blood pressure is well controlled. He is due to have routine, fasting labs done.   Pt is here for routine health maintenance examination  Current Medication: Outpatient Encounter Medications as of 07/16/2018  Medication Sig  . aspirin EC 81 MG tablet Take 81 mg by mouth daily.  . cyanocobalamin 500 MCG tablet Take 500 mcg by mouth daily.  [name] HYDROcodone-acetaminophen (NORCO) 5-325 MG tablet Take 1 tablet by mouth every 6 (six) hours as needed for up to 15 doses for severe pain.  [name] ibuprofen (ADVIL,MOTRIN) 200 MG tablet Take by mouth.  . meloxicam (MOBIC) 15 MG tablet Take 1 tablet (15 mg total) by mouth daily.  . pantoprazole (PROTONIX) 40 MG tablet Take 1 tablet (40 mg total) by mouth daily.  [name] tiZANidine (ZANAFLEX) 4 MG tablet Take by mouth.  . valsartan-hydrochlorothiazide (DIOVAN-HCT) 320-25 MG tablet Take 1 tablet by mouth daily.  . [DISCONTINUED] amoxicillin (AMOXIL) 875 MG tablet Take 1 tablet (875 mg total) by mouth 2 (two) times daily. (Patient not taking: Reported on 07/16/2018)   No facility-administered encounter medications on file as of 07/16/2018.     Surgical History: Past Surgical History:  Procedure Laterality Date  . COLONOSCOPY WITH PROPOFOL N/A 05/03/2015   Procedure:  COLONOSCOPY WITH PROPOFOL;  Surgeon: [name], MD;  Location: [facility] ENDOSCOPY;  Service: Gastroenterology;  Laterality: N/A;  . ESOPHAGOGASTRODUODENOSCOPY (EGD) WITH PROPOFOL N/A 05/03/2015   Procedure: ESOPHAGOGASTRODUODENOSCOPY (EGD) WITH PROPOFOL;  Surgeon: [name], MD;  Location: [facility] ENDOSCOPY;  Service: Gastroenterology;  Laterality: N/A;  . HERNIA REPAIR     1975    Medical History: Past Medical History:  Diagnosis Date  . Anemia   . Hypertension     Family History: Family History  Problem Relation Age of Onset  . Alzheimer's disease Father       Review of Systems  Constitutional: Negative for activity change, chills, fatigue and unexpected weight change.  HENT: Negative for congestion, postnasal drip, rhinorrhea, sneezing and sore throat.   Respiratory: Negative for cough, chest tightness, shortness of breath and wheezing.   Cardiovascular: Negative for chest pain and palpitations.  Gastrointestinal: Negative for abdominal pain, constipation, diarrhea, nausea and vomiting.  Endocrine: Negative for cold intolerance, heat intolerance, polydipsia and polyuria.  Genitourinary: Negative for dysuria and frequency.  Musculoskeletal: Positive for arthralgias. Negative for back pain, joint swelling and neck pain.       Left wrist and hand pain with swelling of the wrist joint.  Skin: Negative for rash.  Allergic/Immunologic: Negative for environmental allergies.  Neurological: Negative for dizziness, tremors, weakness, numbness and headaches.       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 Does  not bruise/bleed easily.  Psychiatric/Behavioral: Negative for behavioral problems (Depression), sleep disturbance and suicidal ideas. The patient is not nervous/anxious.     Today's Vitals   07/16/18 1401  BP: 121/71  Pulse: 94  Resp: 16  SpO2: 99%  Weight: (!) 315 lb 6.4 oz (143.1 kg)  Height: 6' 2.5" (1.892 m)   Body  mass index is 39.95 kg/m.  Physical Exam Vitals signs and nursing note reviewed.  Constitutional:      General: He is not in acute distress.    Appearance: Normal appearance. He is well-developed. He is not diaphoretic.  HENT:     Head: Normocephalic and atraumatic.     Mouth/Throat:     Pharynx: No oropharyngeal exudate.  Eyes:     Conjunctiva/sclera: Conjunctivae normal.     Pupils: Pupils are equal, round, and reactive to light.  Neck:     Musculoskeletal: Normal range of motion and neck supple.     Thyroid: No thyromegaly.     Vascular: No carotid bruit or JVD.     Trachea: No tracheal deviation.  Cardiovascular:     Rate and Rhythm: Normal rate and regular rhythm.     Pulses: Normal pulses.     Heart sounds: Normal heart sounds. No murmur. No friction rub. No gallop.   Pulmonary:     Effort: Pulmonary effort is normal. No respiratory distress.     Breath sounds: Normal breath sounds. No wheezing or rales.  Chest:     Chest wall: No tenderness.  Abdominal:     General: Bowel sounds are normal.     Palpations: Abdomen is soft.     Tenderness: There is no abdominal tenderness.  Musculoskeletal: Normal range of motion.       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  Skin:    General: Skin is warm and dry.  Neurological:     Mental Status: He is alert and oriented to person, place, and time. Mental status is at baseline.     Cranial Nerves: No cranial nerve deficit.  Psychiatric:        Behavior: Behavior normal.        Thought Content: Thought content normal.        Judgment: Judgment normal.    Depression screen [facility] 2/9 07/16/2018 02/28/2018 01/15/2018 07/12/2017 05/31/2017  Decreased Interest 0 0 0 0 0  Down, Depressed, Hopeless 0 0 0 0 0  PHQ - 2 Score 0 0 0 0 0    Functional Status Survey: Is the patient deaf or have difficulty hearing?: No Does the patient have difficulty  seeing, even when wearing glasses/contacts?: No Does the patient have difficulty concentrating, remembering, or making decisions?: No Does the patient have difficulty walking or climbing stairs?: No Does the patient have difficulty dressing or bathing?: No Does the patient have difficulty doing errands alone such as visiting a doctor's office or shopping?: No  MMSE - [place] Exam 07/16/2018 07/12/2017  Orientation to time 5 5  Orientation to Place 5 5  Registration 3 3  Attention/ Calculation 5 5  Recall 3 3  Language- name 2 objects 2 2  Language- repeat 1 1  Language- follow 3 step command 3 3  Language- read & follow direction 1 1  Write a sentence 1 1  Copy design 1 0  Total score 30 29    Fall Risk  07/16/2018 02/28/2018 01/15/2018 07/12/2017 05/31/2017  Falls in the past year? 0 0 0 Yes Yes  Number falls in past yr: - - - 1 1  Injury with Fall? - - - Yes Yes      LABS: Recent Results (from the past 2160 hour(s))  CBC     Status: Abnormal   Collection Time: 04/29/18  9:00 AM  Result Value Ref Range   WBC 12.7 (H) 3.4 - 10.8 x10E3/uL   RBC 4.31 4.14 - 5.80 x10E6/uL   Hemoglobin 11.3 (L) 13.0 - 17.7 g/dL   Hematocrit 35.3 (L) 37.5 - 51.0 %   MCV 82 79 - 97 fL   MCH 26.2 (L) 26.6 - 33.0 pg   MCHC 32.0 31.5 - 35.7 g/dL   RDW 18.2 (H) 11.6 - 15.4 %   Platelets 570 (H) 150 - 450 x10E3/uL    Assessment/Plan: 1. Encounter for general adult medical examination with abnormal findings Annual health maintenance exam today. Lab slip given to have routine, fasting labs done.   2. Essential hypertension, benign Stable. Continue all BP medication as prescribed   3. Leukocytosis, unspecified type Recheck CBC with labs.   4. Left wrist pain Will get x-ray of the left wrist for further evaluation.  - DG Wrist Complete Left; Future   General Counseling: oseias horsey understanding of the findings of todays visit and agrees with plan of treatment. I have discussed  any further diagnostic evaluation that may be needed or ordered today. We also reviewed his medications today. he has been encouraged to call the office with any questions or concerns that should arise related to todays visit.    Counseling:  Hypertension Counseling:   The following hypertensive lifestyle modification were recommended and discussed:  1. Limiting alcohol intake to less than 1 oz/day of ethanol:(24 oz of beer or 8 oz of wine or 2 oz of 100-proof whiskey). 2. Take baby ASA 81 mg daily. 3. Importance of regular aerobic exercise and losing weight. 4. Reduce dietary saturated fat and cholesterol intake for overall cardiovascular health. 5. Maintaining adequate dietary potassium, calcium, and magnesium intake. 6. Regular monitoring of the blood pressure. 7. Reduce sodium intake to less than 100 mmol/day (less than 2.3 gm of sodium or less than 6 gm of sodium choride)   This patient was seen by [place] with Dr Lavera Guise as a part of collaborative care agreement  Orders Placed This Encounter  Procedures  . DG Wrist Complete Left     Time spent: [place], MD  Internal Medicine

## 2018-07-22 ENCOUNTER — Telehealth: Payer: Self-pay

## 2018-07-22 ENCOUNTER — Ambulatory Visit
Admission: RE | Admit: 2018-07-22 | Discharge: 2018-07-22 | Disposition: A | Discharge status: Home / Self Care | Payer: Medicare Other | Source: Ambulatory Visit | Attending: Nurse Practitioner | Admitting: Nurse Practitioner

## 2018-07-22 ENCOUNTER — Other Ambulatory Visit: Payer: Self-pay | Admitting: Nurse Practitioner

## 2018-07-22 ENCOUNTER — Other Ambulatory Visit: Payer: Self-pay

## 2018-07-22 ENCOUNTER — Ambulatory Visit
Admission: RE | Admit: 2018-07-22 | Discharge: 2018-07-22 | Disposition: A | Discharge status: Home / Self Care | Payer: Medicare Other | Attending: Nurse Practitioner | Admitting: Nurse Practitioner

## 2018-07-22 DIAGNOSIS — I10 Essential (primary) hypertension: Secondary | ICD-10-CM | POA: Diagnosis not present

## 2018-07-22 DIAGNOSIS — M25532 Pain in left wrist: Secondary | ICD-10-CM | POA: Diagnosis not present

## 2018-07-22 DIAGNOSIS — M19032 Primary osteoarthritis, left wrist: Secondary | ICD-10-CM | POA: Diagnosis not present

## 2018-07-22 DIAGNOSIS — Z0001 Encounter for general adult medical examination with abnormal findings: Secondary | ICD-10-CM | POA: Diagnosis not present

## 2018-07-22 NOTE — Telephone Encounter (Signed)
Notified pt that x-ray of his hand showed a small abnormality so he needs to see ortho. Scheduled a closer follow up for pt on 08/22/18 per dfk. Notified beth that he will like to see [facility] for ortho.

## 2018-07-22 NOTE — Telephone Encounter (Signed)
Rescheduled appt from 08/22/18 to 10/22/18, dfk wanted a 3 month fu.

## 2018-07-23 LAB — COMPREHENSIVE METABOLIC PANEL
ALT: 6 IU/L (ref 0–44)
AST: 13 IU/L (ref 0–40)
Albumin/Globulin Ratio: 1.2 (ref 1.2–2.2)
Albumin: 4.1 g/dL (ref 3.7–4.7)
Alkaline Phosphatase: 93 IU/L (ref 39–117)
BUN/Creatinine Ratio: 15 (ref 10–24)
BUN: 16 mg/dL (ref 8–27)
Bilirubin Total: 0.3 mg/dL (ref 0.0–1.2)
CO2: 24 mmol/L (ref 20–29)
Calcium: 9.4 mg/dL (ref 8.6–10.2)
Chloride: 98 mmol/L (ref 96–106)
Creatinine, Ser: 1.05 mg/dL (ref 0.76–1.27)
GFR calc Af Amer: 78 mL/min/{1.73_m2} (ref >59)
GFR calc non Af Amer: 67 mL/min/{1.73_m2} (ref >59)
Globulin, Total: 3.3 g/dL (ref 1.5–4.5)
Glucose: 103 mg/dL — ABNORMAL HIGH (ref 65–99)
Potassium: 4.4 mmol/L (ref 3.5–5.2)
Sodium: 136 mmol/L (ref 134–144)
Total Protein: 7.4 g/dL (ref 6.0–8.5)

## 2018-07-23 LAB — CBC
Hematocrit: 35.1 % — ABNORMAL LOW (ref 37.5–51.0)
Hemoglobin: 11.4 g/dL — ABNORMAL LOW (ref 13.0–17.7)
MCH: 27.3 pg (ref 26.6–33.0)
MCHC: 32.5 g/dL (ref 31.5–35.7)
MCV: 84 fL (ref 79–97)
Platelets: 470 10*3/uL — ABNORMAL HIGH (ref 150–450)
RBC: 4.18 x10E6/uL (ref 4.14–5.80)
RDW: 16.8 % — ABNORMAL HIGH (ref 11.6–15.4)
WBC: 19.2 10*3/uL — ABNORMAL HIGH (ref 3.4–10.8)

## 2018-07-23 LAB — PSA: Prostate Specific Ag, Serum: 3.7 ng/mL (ref 0.0–4.0)

## 2018-07-23 LAB — HGB A1C W/O EAG: Hgb A1c MFr Bld: 6.4 % — ABNORMAL HIGH (ref 4.8–5.6)

## 2018-07-23 LAB — HEPATIC FUNCTION PANEL (6): Bilirubin, Direct: 0.11 mg/dL (ref 0.00–0.40)

## 2018-07-23 LAB — TSH: TSH: 1.42 u[IU]/mL (ref 0.450–4.500)

## 2018-07-23 LAB — T4, FREE: Free T4: 1.1 ng/dL (ref 0.82–1.77)

## 2018-07-24 DIAGNOSIS — M19032 Primary osteoarthritis, left wrist: Secondary | ICD-10-CM | POA: Diagnosis not present

## 2018-07-24 DIAGNOSIS — M25532 Pain in left wrist: Secondary | ICD-10-CM | POA: Diagnosis not present

## 2018-07-24 DIAGNOSIS — G8929 Other chronic pain: Secondary | ICD-10-CM | POA: Diagnosis not present

## 2018-07-24 NOTE — Progress Notes (Signed)
Hey. Can we get patient scheduled for sooner appointment. (does not need to be today). I am concerned about labs. Thanks.

## 2018-08-06 ENCOUNTER — Telehealth: Payer: Self-pay

## 2018-08-06 ENCOUNTER — Ambulatory Visit (INDEPENDENT_AMBULATORY_CARE_PROVIDER_SITE_OTHER): Payer: Medicare Other | Admitting: Nurse Practitioner

## 2018-08-06 ENCOUNTER — Encounter: Payer: Self-pay | Admitting: Nurse Practitioner

## 2018-08-06 ENCOUNTER — Other Ambulatory Visit: Payer: Self-pay

## 2018-08-06 VITALS — BP 128/85 | Temp 97.9°F

## 2018-08-06 DIAGNOSIS — D72829 Elevated white blood cell count, unspecified: Secondary | ICD-10-CM

## 2018-08-06 DIAGNOSIS — M25532 Pain in left wrist: Secondary | ICD-10-CM

## 2018-08-06 DIAGNOSIS — I10 Essential (primary) hypertension: Secondary | ICD-10-CM | POA: Diagnosis not present

## 2018-08-06 MED ORDER — AMOXICILLIN-POT CLAVULANATE 875-125 MG PO TABS
1.0000 | ORAL_TABLET | Freq: Two times a day (BID) | ORAL | 0 refills | Status: DC
Start: 2018-08-06 — End: 2018-09-03

## 2018-08-06 NOTE — Progress Notes (Signed)
[name], [place] [address]  Internal MEDICINE  Telephone Visit  Patient Name: Samuel Frank  [id number]  [id number]  Date of Service: 08/13/2018  I connected with the patient at 10:08am by webcam and verified the patients identity using two identifiers.   I discussed the limitations, risks, security and privacy concerns of performing an evaluation and management service by webcam and the availability of in person appointments. I also discussed with the patient that there may be a patient responsible charge related to the service.  The patient expressed understanding and agrees to proceed.    Chief Complaint  Patient presents with  . Telephone Assessment  . Telephone Screen  . Fever  . Vomiting  . Medical Management of Chronic Issues    pt said since he start meloxicam  pt said that last few days, monday temp100    The patient has been contacted via webcam for follow up visit due to concerns for spread of novel coronavirus. Recently had labs work done showing moderately elevated WBC at 19.2. his WBC has been elevated in the past and returned to near normal after short treatment with antibiotics. Will repeat with augmentin and recheck CBC. If still elevated, will refer to hematology.  Patient seeing orthopedics for wrist deformity. Started on meloxicam. Has really bothered his stomach. Today, he has not taken it. Belly is much better. Appetite is returning to normal. Recommended he contact the orthopedic provider for further evaluation and adjustment to treatment.       Current Medication: Outpatient Encounter Medications as of 08/06/2018  Medication Sig  . aspirin EC 81 MG tablet Take 81 mg by mouth daily.  . cyanocobalamin 500 MCG tablet Take 500 mcg by mouth daily.  [name] HYDROcodone-acetaminophen (NORCO) 5-325 MG tablet Take 1 tablet by mouth every 6 (six) hours as needed for up to 15 doses for severe pain.  [name] ibuprofen (ADVIL,MOTRIN) 200 MG  tablet Take by mouth.  . meloxicam (MOBIC) 15 MG tablet Take 1 tablet (15 mg total) by mouth daily.  . pantoprazole (PROTONIX) 40 MG tablet Take 1 tablet (40 mg total) by mouth daily.  [name] tiZANidine (ZANAFLEX) 4 MG tablet Take by mouth.  . valsartan-hydrochlorothiazide (DIOVAN-HCT) 320-25 MG tablet Take 1 tablet by mouth daily.  . [DISCONTINUED] meloxicam (MOBIC) 15 MG tablet Take by mouth.  [name] amoxicillin-clavulanate (AUGMENTIN) 875-125 MG tablet Take 1 tablet by mouth 2 (two) times daily.   No facility-administered encounter medications on file as of 08/06/2018.     Surgical History: Past Surgical History:  Procedure Laterality Date  . COLONOSCOPY WITH PROPOFOL N/A 05/03/2015   Procedure: COLONOSCOPY WITH PROPOFOL;  Surgeon: [name], MD;  Location: [facility] ENDOSCOPY;  Service: Gastroenterology;  Laterality: N/A;  . ESOPHAGOGASTRODUODENOSCOPY (EGD) WITH PROPOFOL N/A 05/03/2015   Procedure: ESOPHAGOGASTRODUODENOSCOPY (EGD) WITH PROPOFOL;  Surgeon: [name], MD;  Location: [facility] ENDOSCOPY;  Service: Gastroenterology;  Laterality: N/A;  . HERNIA REPAIR     1975    Medical History: Past Medical History:  Diagnosis Date  . Anemia   . Hypertension     Family History: Family History  Problem Relation Age of Onset  . Alzheimer's disease Father     Social History   Socioeconomic History  . Marital status: Divorced    Spouse name: Not on file  . Number of children: Not on file  . Years of education: Not on file  . Highest education level: Not on file  Occupational  History  . Not on file  Social Needs  . Financial resource strain: Not on file  . Food insecurity:    Worry: Not on file    Inability: Not on file  . Transportation needs:    Medical: Not on file    Non-medical: Not on file  Tobacco Use  . Smoking status: Never Smoker  . Smokeless tobacco: Never Used  Substance and Sexual Activity  . Alcohol use: No  . Drug use: No  . Sexual activity: Not on file  Lifestyle  .  Physical activity:    Days per week: Not on file    Minutes per session: Not on file  . Stress: Not on file  Relationships  . Social connections:    Talks on phone: Not on file    Gets together: Not on file    Attends religious service: Not on file    Active member of club or organization: Not on file    Attends meetings of clubs or organizations: Not on file    Relationship status: Not on file  . Intimate partner violence:    Fear of current or ex partner: Not on file    Emotionally abused: Not on file    Physically abused: Not on file    Forced sexual activity: Not on file  Other Topics Concern  . Not on file  Social History Narrative  . Not on file      Review of Systems  Constitutional: Negative for activity change, chills, fatigue and unexpected weight change.  HENT: Negative for congestion, postnasal drip, rhinorrhea, sneezing and sore throat.   Respiratory: Negative for cough, chest tightness, shortness of breath and wheezing.   Cardiovascular: Negative for chest pain and palpitations.  Gastrointestinal: Negative for abdominal pain, constipation, diarrhea, nausea and vomiting.  Endocrine: Negative for cold intolerance, heat intolerance, polydipsia and polyuria.  Musculoskeletal: Positive for arthralgias. Negative for back pain, joint swelling and neck pain.       Left wrist and hand pain with swelling of the wrist joint.  Skin: Negative for rash.  Allergic/Immunologic: Negative for environmental allergies.  Neurological: Negative for dizziness, tremors, weakness, numbness and headaches.       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 Does not bruise/bleed easily.  Psychiatric/Behavioral: Negative for behavioral problems (Depression), sleep disturbance and suicidal ideas. The patient is not nervous/anxious.     Today's Vitals   08/06/18 0944  BP: 128/85  Temp: 97.9 F (36.6 C)   There is no height or  weight on file to calculate BMI.  Observation/Objective:  The patient is alert and oriented. He is pleasant and answering all questions appropriately. Breathing is non-labored. He is in no acute distress.    Assessment/Plan: 1. Leukocytosis, unspecified type Treat with augmentin 875mg  bid for 14 days. Will retest CBC about one week after completion of antibiotics. Further treatment or referral to hematology as indicated  - amoxicillin-clavulanate (AUGMENTIN) 875-125 MG tablet; Take 1 tablet by mouth 2 (two) times daily.  Dispense: 28 tablet; Refill: 0  2. Essential hypertension, benign Stable. Continue bp medication as prescribed   3. Left wrist pain Patient now seeing orthopedics   General Counseling: Samuel Frank understanding of the findings of today's phone visit and agrees with plan of treatment. I have discussed any further diagnostic evaluation that may be needed or ordered today. We also reviewed his medications today. he has been encouraged to call the  office with any questions or concerns that should arise related to todays visit.  This patient was seen by [place] with Dr Lavera Guise as a part of collaborative care agreement  Meds ordered this encounter  Medications  . amoxicillin-clavulanate (AUGMENTIN) 875-125 MG tablet    Sig: Take 1 tablet by mouth 2 (two) times daily.    Dispense:  28 tablet    Refill:  0    Order Specific Question:   Supervising Provider    Answer:   Lavera Guise [[id number]]    Time spent: 38 Minutes    Dr Lavera Guise Internal medicine

## 2018-08-06 NOTE — Telephone Encounter (Signed)
Mailed labslip 

## 2018-08-20 ENCOUNTER — Other Ambulatory Visit: Payer: Self-pay

## 2018-08-20 DIAGNOSIS — M542 Cervicalgia: Secondary | ICD-10-CM | POA: Diagnosis not present

## 2018-08-20 DIAGNOSIS — M170 Bilateral primary osteoarthritis of knee: Secondary | ICD-10-CM | POA: Diagnosis not present

## 2018-08-20 DIAGNOSIS — M2550 Pain in unspecified joint: Secondary | ICD-10-CM | POA: Diagnosis not present

## 2018-08-20 DIAGNOSIS — G8929 Other chronic pain: Secondary | ICD-10-CM | POA: Diagnosis not present

## 2018-08-20 DIAGNOSIS — K219 Gastro-esophageal reflux disease without esophagitis: Secondary | ICD-10-CM

## 2018-08-20 MED ORDER — PANTOPRAZOLE SODIUM 40 MG PO TBEC: 40.0000 mg | DELAYED_RELEASE_TABLET | Freq: Every day | ORAL | 1 refills | Status: DC

## 2018-08-21 ENCOUNTER — Other Ambulatory Visit: Payer: Self-pay | Admitting: Nurse Practitioner

## 2018-08-21 DIAGNOSIS — D72829 Elevated white blood cell count, unspecified: Secondary | ICD-10-CM | POA: Diagnosis not present

## 2018-08-22 ENCOUNTER — Ambulatory Visit: Payer: Self-pay | Admitting: Adult Health

## 2018-08-22 LAB — CBC
Hematocrit: 23 % — ABNORMAL LOW (ref 37.5–51.0)
Hemoglobin: 7.1 g/dL — ABNORMAL LOW (ref 13.0–17.7)
MCH: 26 pg — ABNORMAL LOW (ref 26.6–33.0)
MCHC: 30.9 g/dL — ABNORMAL LOW (ref 31.5–35.7)
MCV: 84 fL (ref 79–97)
Platelets: 791 10*3/uL — ABNORMAL HIGH (ref 150–450)
RBC: 2.73 x10E6/uL — CL (ref 4.14–5.80)
RDW: 16.9 % — ABNORMAL HIGH (ref 11.6–15.4)
WBC: 42.3 10*3/uL (ref 3.4–10.8)

## 2018-08-26 ENCOUNTER — Other Ambulatory Visit: Payer: Self-pay | Admitting: Nurse Practitioner

## 2018-08-26 DIAGNOSIS — D72829 Elevated white blood cell count, unspecified: Secondary | ICD-10-CM

## 2018-08-26 NOTE — Progress Notes (Signed)
Patient with severe elevation of WBC despite treatment with antibiotics. Lab results today show WBC 42.3. stat referral to hematology made. Patient contacted by me via telephone and notified of results. agress to be referred to hematologist.

## 2018-09-02 ENCOUNTER — Telehealth: Payer: Self-pay

## 2018-09-02 ENCOUNTER — Other Ambulatory Visit: Payer: Self-pay | Admitting: Oncology

## 2018-09-02 ENCOUNTER — Inpatient Hospital Stay: Payer: Medicare Other | Admitting: Oncology

## 2018-09-02 DIAGNOSIS — D649 Anemia, unspecified: Secondary | ICD-10-CM

## 2018-09-02 DIAGNOSIS — D72829 Elevated white blood cell count, unspecified: Secondary | ICD-10-CM

## 2018-09-02 DIAGNOSIS — D75839 Thrombocytosis, unspecified: Secondary | ICD-10-CM

## 2018-09-02 DIAGNOSIS — D473 Essential (hemorrhagic) thrombocythemia: Secondary | ICD-10-CM

## 2018-09-02 NOTE — Progress Notes (Signed)
leucocytosis

## 2018-09-02 NOTE — Telephone Encounter (Signed)
Spoke with pt daughter advised her that labs result already given to pt by heather also  Gave result to daughter he already had appt with hematology

## 2018-09-03 ENCOUNTER — Other Ambulatory Visit: Payer: Self-pay | Admitting: Student

## 2018-09-03 ENCOUNTER — Inpatient Hospital Stay: Payer: Medicare Other | Attending: Oncology | Admitting: Oncology

## 2018-09-03 ENCOUNTER — Encounter: Payer: Self-pay | Admitting: Oncology

## 2018-09-03 ENCOUNTER — Other Ambulatory Visit: Payer: Self-pay

## 2018-09-03 ENCOUNTER — Inpatient Hospital Stay: Payer: Medicare Other | Admitting: *Deleted

## 2018-09-03 VITALS — BP 110/67 | HR 111 | Temp 99.8°F | Resp 18 | Ht 74.0 in | Wt 291.8 lb

## 2018-09-03 DIAGNOSIS — D473 Essential (hemorrhagic) thrombocythemia: Secondary | ICD-10-CM | POA: Diagnosis not present

## 2018-09-03 DIAGNOSIS — D75839 Thrombocytosis, unspecified: Secondary | ICD-10-CM

## 2018-09-03 DIAGNOSIS — D649 Anemia, unspecified: Secondary | ICD-10-CM

## 2018-09-03 DIAGNOSIS — I10 Essential (primary) hypertension: Secondary | ICD-10-CM | POA: Diagnosis not present

## 2018-09-03 DIAGNOSIS — D72829 Elevated white blood cell count, unspecified: Secondary | ICD-10-CM | POA: Diagnosis not present

## 2018-09-03 LAB — CBC WITH DIFFERENTIAL/PLATELET
Abs Immature Granulocytes: 1.02 10*3/uL — ABNORMAL HIGH (ref 0.00–0.07)
Basophils Absolute: 0.1 10*3/uL (ref 0.0–0.1)
Basophils Relative: 0 %
Eosinophils Absolute: 0.3 10*3/uL (ref 0.0–0.5)
Eosinophils Relative: 1 %
HCT: 26 % — ABNORMAL LOW (ref 39.0–52.0)
Hemoglobin: 7.8 g/dL — ABNORMAL LOW (ref 13.0–17.0)
Immature Granulocytes: 4 %
Lymphocytes Relative: 8 %
Lymphs Abs: 2.3 10*3/uL (ref 0.7–4.0)
MCH: 25.9 pg — ABNORMAL LOW (ref 26.0–34.0)
MCHC: 30 g/dL (ref 30.0–36.0)
MCV: 86.4 fL (ref 80.0–100.0)
Monocytes Absolute: 4.8 10*3/uL — ABNORMAL HIGH (ref 0.1–1.0)
Monocytes Relative: 18 %
Neutro Abs: 18.7 10*3/uL — ABNORMAL HIGH (ref 1.7–7.7)
Neutrophils Relative %: 69 %
Platelets: 571 10*3/uL — ABNORMAL HIGH (ref 150–400)
RBC: 3.01 MIL/uL — ABNORMAL LOW (ref 4.22–5.81)
RDW: 21.6 % — ABNORMAL HIGH (ref 11.5–15.5)
Smear Review: INCREASED
WBC: 27.2 10*3/uL — ABNORMAL HIGH (ref 4.0–10.5)
nRBC: 0.3 % — ABNORMAL HIGH (ref 0.0–0.2)

## 2018-09-03 LAB — COMPREHENSIVE METABOLIC PANEL
ALT: 9 U/L (ref 0–44)
AST: 12 U/L — ABNORMAL LOW (ref 15–41)
Albumin: 3.7 g/dL (ref 3.5–5.0)
Alkaline Phosphatase: 75 U/L (ref 38–126)
Anion gap: 10 (ref 5–15)
BUN: 15 mg/dL (ref 8–23)
CO2: 25 mmol/L (ref 22–32)
Calcium: 8.8 mg/dL — ABNORMAL LOW (ref 8.9–10.3)
Chloride: 103 mmol/L (ref 98–111)
Creatinine, Ser: 1.21 mg/dL (ref 0.61–1.24)
GFR calc Af Amer: >60 mL/min (ref >60)
GFR calc non Af Amer: 57 mL/min — ABNORMAL LOW (ref >60)
Glucose, Bld: 103 mg/dL — ABNORMAL HIGH (ref 70–99)
Potassium: 3.5 mmol/L (ref 3.5–5.1)
Sodium: 138 mmol/L (ref 135–145)
Total Bilirubin: 0.6 mg/dL (ref 0.3–1.2)
Total Protein: 8.3 g/dL — ABNORMAL HIGH (ref 6.5–8.1)

## 2018-09-03 LAB — LACTATE DEHYDROGENASE: LDH: 150 U/L (ref 98–192)

## 2018-09-03 LAB — IRON AND TIBC
Iron: 21 ug/dL — ABNORMAL LOW (ref 45–182)
Saturation Ratios: 8 % — ABNORMAL LOW (ref 17.9–39.5)
TIBC: 264 ug/dL (ref 250–450)
UIBC: 243 ug/dL

## 2018-09-03 LAB — RETICULOCYTES
Immature Retic Fract: 29.2 % — ABNORMAL HIGH (ref 2.3–15.9)
RBC.: 3.01 MIL/uL — ABNORMAL LOW (ref 4.22–5.81)
Retic Count, Absolute: 165.6 10*3/uL (ref 19.0–186.0)
Retic Ct Pct: 5.5 % — ABNORMAL HIGH (ref 0.4–3.1)

## 2018-09-03 LAB — VITAMIN B12: Vitamin B-12: 844 pg/mL (ref 180–914)

## 2018-09-03 LAB — FOLATE: Folate: 17.5 ng/mL (ref >5.9)

## 2018-09-03 LAB — URIC ACID: Uric Acid, Serum: 11.3 mg/dL — ABNORMAL HIGH (ref 3.7–8.6)

## 2018-09-03 LAB — TSH: TSH: 0.658 u[IU]/mL (ref 0.350–4.500)

## 2018-09-03 LAB — FERRITIN: Ferritin: 45 ng/mL (ref 24–336)

## 2018-09-03 LAB — PATHOLOGIST SMEAR REVIEW

## 2018-09-03 NOTE — Progress Notes (Signed)
Patient here to establish care. Pt states he had lost about 20 pounds in 2 weeks do to lack of appetite. His appetite is increasing now.

## 2018-09-04 ENCOUNTER — Encounter
Admit: 2018-09-04 | Discharge: 2018-09-04 | Payer: MEDICARE | Attending: Hematology & Oncology | Primary: Hematology & Oncology

## 2018-09-04 ENCOUNTER — Other Ambulatory Visit (HOSPITAL_COMMUNITY)
Admission: RE | Admit: 2018-09-04 | Disposition: A | Discharge status: Home / Self Care | Payer: Medicare Other | Source: Ambulatory Visit | Attending: Oncology | Admitting: Oncology

## 2018-09-04 ENCOUNTER — Ambulatory Visit
Admission: RE | Admit: 2018-09-04 | Discharge: 2018-09-04 | Disposition: A | Discharge status: Home / Self Care | Payer: Medicare Other | Source: Ambulatory Visit | Attending: Oncology | Admitting: Oncology

## 2018-09-04 ENCOUNTER — Other Ambulatory Visit: Payer: Self-pay

## 2018-09-04 ENCOUNTER — Telehealth: Payer: Self-pay | Admitting: Oncology

## 2018-09-04 ENCOUNTER — Other Ambulatory Visit (HOSPITAL_COMMUNITY)
Admission: RE | Admit: 2018-09-04 | Discharge: 2018-09-04 | Disposition: A | Discharge status: Home / Self Care | Payer: Medicare Other | Attending: Oncology | Admitting: Oncology

## 2018-09-04 DIAGNOSIS — C9310 Chronic myelomonocytic leukemia not having achieved remission: Secondary | ICD-10-CM | POA: Insufficient documentation

## 2018-09-04 DIAGNOSIS — Z79899 Other long term (current) drug therapy: Secondary | ICD-10-CM | POA: Diagnosis not present

## 2018-09-04 DIAGNOSIS — Z801 Family history of malignant neoplasm of trachea, bronchus and lung: Secondary | ICD-10-CM | POA: Diagnosis not present

## 2018-09-04 DIAGNOSIS — I10 Essential (primary) hypertension: Secondary | ICD-10-CM | POA: Insufficient documentation

## 2018-09-04 DIAGNOSIS — D72829 Elevated white blood cell count, unspecified: Secondary | ICD-10-CM | POA: Diagnosis not present

## 2018-09-04 DIAGNOSIS — D759 Disease of blood and blood-forming organs, unspecified: Secondary | ICD-10-CM | POA: Diagnosis not present

## 2018-09-04 DIAGNOSIS — D7589 Other specified diseases of blood and blood-forming organs: Secondary | ICD-10-CM | POA: Diagnosis not present

## 2018-09-04 DIAGNOSIS — Z791 Long term (current) use of non-steroidal anti-inflammatories (NSAID): Secondary | ICD-10-CM | POA: Diagnosis not present

## 2018-09-04 DIAGNOSIS — Z7982 Long term (current) use of aspirin: Secondary | ICD-10-CM | POA: Insufficient documentation

## 2018-09-04 DIAGNOSIS — D473 Essential (hemorrhagic) thrombocythemia: Secondary | ICD-10-CM

## 2018-09-04 DIAGNOSIS — D649 Anemia, unspecified: Secondary | ICD-10-CM | POA: Insufficient documentation

## 2018-09-04 DIAGNOSIS — D75839 Thrombocytosis, unspecified: Secondary | ICD-10-CM

## 2018-09-04 DIAGNOSIS — C9312 Chronic myelomonocytic leukemia, in relapse: Secondary | ICD-10-CM | POA: Diagnosis not present

## 2018-09-04 DIAGNOSIS — R7989 Other specified abnormal findings of blood chemistry: Secondary | ICD-10-CM | POA: Insufficient documentation

## 2018-09-04 LAB — CBC WITH DIFFERENTIAL/PLATELET
Abs Immature Granulocytes: 1.02 10*3/uL — ABNORMAL HIGH (ref 0.00–0.07)
Basophils Absolute: 0.1 10*3/uL (ref 0.0–0.1)
Basophils Relative: 0 %
Eosinophils Absolute: 0.4 10*3/uL (ref 0.0–0.5)
Eosinophils Relative: 2 %
HCT: 26.8 % — ABNORMAL LOW (ref 39.0–52.0)
Hemoglobin: 8.1 g/dL — ABNORMAL LOW (ref 13.0–17.0)
Immature Granulocytes: 4 %
Lymphocytes Relative: 10 %
Lymphs Abs: 2.5 10*3/uL (ref 0.7–4.0)
MCH: 26.2 pg (ref 26.0–34.0)
MCHC: 30.2 g/dL (ref 30.0–36.0)
MCV: 86.7 fL (ref 80.0–100.0)
Monocytes Absolute: 4.2 10*3/uL — ABNORMAL HIGH (ref 0.1–1.0)
Monocytes Relative: 17 %
Neutro Abs: 17 10*3/uL — ABNORMAL HIGH (ref 1.7–7.7)
Neutrophils Relative %: 67 %
Platelets: 524 10*3/uL — ABNORMAL HIGH (ref 150–400)
RBC: 3.09 MIL/uL — ABNORMAL LOW (ref 4.22–5.81)
RDW: 21.5 % — ABNORMAL HIGH (ref 11.5–15.5)
Smear Review: NORMAL
WBC: 25.2 10*3/uL — ABNORMAL HIGH (ref 4.0–10.5)
nRBC: 0.2 % (ref 0.0–0.2)

## 2018-09-04 LAB — HAPTOGLOBIN: Haptoglobin: 209 mg/dL (ref 34–355)

## 2018-09-04 LAB — PROTIME-INR
INR: 1.2 (ref 0.8–1.2)
Prothrombin Time: 14.6 seconds (ref 11.4–15.2)

## 2018-09-04 MED ORDER — FENTANYL CITRATE (PF) 100 MCG/2ML IJ SOLN
INTRAMUSCULAR | Status: AC | PRN
  Administered 2018-09-04: 50 ug via INTRAVENOUS
  Administered 2018-09-04: 25 ug via INTRAVENOUS

## 2018-09-04 MED ORDER — FENTANYL CITRATE (PF) 100 MCG/2ML IJ SOLN
INTRAMUSCULAR | Status: AC
  Filled 2018-09-04: qty 4

## 2018-09-04 MED ORDER — MIDAZOLAM HCL 5 MG/5ML IJ SOLN
INTRAMUSCULAR | Status: AC
  Filled 2018-09-04: qty 5

## 2018-09-04 MED ORDER — SODIUM CHLORIDE 0.9 % IV SOLN
INTRAVENOUS | Status: DC
  Administered 2018-09-04: 09:00:00 via INTRAVENOUS

## 2018-09-04 MED ORDER — HEPARIN SOD (PORK) LOCK FLUSH 100 UNIT/ML IV SOLN
INTRAVENOUS | Status: AC
  Filled 2018-09-04: qty 5

## 2018-09-04 MED ORDER — ALLOPURINOL 300 MG PO TABS: 300.0000 mg | ORAL_TABLET | Freq: Every day | ORAL | 0 refills | Status: DC

## 2018-09-04 MED ORDER — MIDAZOLAM HCL 5 MG/5ML IJ SOLN
INTRAMUSCULAR | Status: AC | PRN
  Administered 2018-09-04: 0.5 mg via INTRAVENOUS
  Administered 2018-09-04: 1 mg via INTRAVENOUS
  Administered 2018-09-04: 2 mg via INTRAVENOUS

## 2018-09-04 NOTE — Discharge Instructions (Signed)

## 2018-09-04 NOTE — Procedures (Signed)
Chief Complaint:  leukocytosis   Referring Physician(s): Rao,Archana C  History of Present Illness: Samuel Frank. is a 78 y.o. male with leukocytosis and thrombocytosis, unspecified.  Treated with abxs and no change in elevated WBC ct. No complaints today.  Here for CT BM asp and core bx.  ROS neg.  Past Medical History:  Diagnosis Date  . Anemia   . Hypertension     Past Surgical History:  Procedure Laterality Date  . COLONOSCOPY WITH PROPOFOL N/A 05/03/2015   Procedure: COLONOSCOPY WITH PROPOFOL;  Surgeon: [name], MD;  Location: [facility] ENDOSCOPY;  Service: Gastroenterology;  Laterality: N/A;  . ESOPHAGOGASTRODUODENOSCOPY (EGD) WITH PROPOFOL N/A 05/03/2015   Procedure: ESOPHAGOGASTRODUODENOSCOPY (EGD) WITH PROPOFOL;  Surgeon: [name], MD;  Location: [facility] ENDOSCOPY;  Service: Gastroenterology;  Laterality: N/A;  . HERNIA REPAIR     1975    Allergies: Patient has no known allergies.  Medications: Prior to Admission medications   Medication Sig Start Date End Date Taking? Authorizing Provider  aspirin EC 81 MG tablet Take 81 mg by mouth daily.   Yes [provider]  colchicine 0.6 MG tablet Take 2 tablets (1.2mg ) by mouth at first sign of gout flare followed by 1 tablet (0.6mg ) after 1 hour. (Max 1.8mg  within 1 hour) 03/21/18  Yes [provider]  cyanocobalamin 500 MCG tablet Take 500 mcg by mouth daily.   Yes [provider]  cyclobenzaprine (FLEXERIL) 10 MG tablet Take by mouth. 08/20/18 09/19/18 Yes [provider]  ibuprofen (ADVIL,MOTRIN) 200 MG tablet Take by mouth.   Yes [provider]  meloxicam (MOBIC) 15 MG tablet Take 1 tablet (15 mg total) by mouth daily. 09/06/17 09/06/18 Yes Fisher, Linden Dolin, PA-C  pantoprazole (PROTONIX) 40 MG tablet Take 1 tablet (40 mg total) by mouth daily. 08/20/18  Yes Boscia, Heather E, NP  tiZANidine (ZANAFLEX) 4 MG tablet Take by mouth. 03/13/17  Yes [provider]   valsartan-hydrochlorothiazide (DIOVAN-HCT) 320-25 MG tablet Take 1 tablet by mouth daily. 05/20/18  Yes Ronnell Freshwater, NP     Family History  Problem Relation Age of Onset  . Alzheimer's disease Father   . Lung cancer Sister   . Cancer Sister     Social History   Socioeconomic History  . Marital status: Divorced    Spouse name: Not on file  . Number of children: 4  . Years of education: Not on file  . Highest education level: Not on file  Occupational History  . Not on file  Social Needs  . Financial resource strain: Not hard at all  . Food insecurity    Worry: Never true    Inability: Never true  . Transportation needs    Medical: No    Non-medical: No  Tobacco Use  . Smoking status: Never Smoker  . Smokeless tobacco: Never Used  Substance and Sexual Activity  . Alcohol use: No  . Drug use: No  . Sexual activity: Not on file  Lifestyle  . Physical activity    Days per week: 3 days    Minutes per session: 150+ min  . Stress: Not at all  Relationships  . Social connections    Talks on phone: More than three times a week    Gets together: More than three times a week    Attends religious service: More than 4 times per year    Active member of club or organization: No    Attends meetings  of clubs or organizations: Never    Relationship status: Divorced  Other Topics Concern  . Not on file  Social History Narrative  . Not on file      Review of Systems: A 12 point ROS discussed and pertinent positives are indicated in the HPI above.  All other systems are negative.  Review of Systems  Vital Signs: BP 103/67   Pulse (!) 102   Temp 98.2 F (36.8 C) (Oral)   Resp (!) 28   Ht 6' 2.5" (1.892 m)   Wt 132 kg   SpO2 96%   BMI 36.86 kg/m   Physical Exam Constitutional:      General: He is not in acute distress.    Appearance: He is not toxic-appearing.  HENT:     Nose: No congestion.  Eyes:     General: No scleral icterus.    Conjunctiva/sclera:  Conjunctivae normal.  Cardiovascular:     Rate and Rhythm: Normal rate and regular rhythm.     Heart sounds: Normal heart sounds. No murmur.  Pulmonary:     Effort: Pulmonary effort is normal. No respiratory distress.     Breath sounds: Normal breath sounds.  Abdominal:     General: Bowel sounds are normal.     Palpations: Abdomen is soft.  Neurological:     General: No focal deficit present.     Mental Status: Mental status is at baseline.  Psychiatric:        Mood and Affect: Mood normal.        Thought Content: Thought content normal.     Imaging: No results found.  Labs:  CBC: Recent Labs    07/22/18 0811 08/21/18 0851 09/03/18 1417 09/04/18 0809  WBC 19.2* 42.3* 27.2* 25.2*  HGB 11.4* 7.1* 7.8* 8.1*  HCT 35.1* 23.0* 26.0* 26.8*  PLT 470* 791* 571* 524*    COAGS: Recent Labs    09/04/18 0809  INR 1.2    BMP: Recent Labs    07/22/18 0811 09/03/18 1417  NA 136 138  K 4.4 3.5  CL 98 103  CO2 24 25  GLUCOSE 103* 103*  BUN 16 15  CALCIUM 9.4 8.8*  CREATININE 1.05 1.21  GFRNONAA 67 57*  GFRAA 78 >60    LIVER FUNCTION TESTS: Recent Labs    07/22/18 0811 09/03/18 1417  BILITOT 0.3 0.6  AST 13 12*  ALT 6 9  ALKPHOS 93 75  PROT 7.4 8.3*  ALBUMIN 4.1 3.7    TUMOR MARKERS: No results for input(s): AFPTM, CEA, CA199, CHROMGRNA in the last 8760 hours.  Assessment and Plan:  Plan for CT BM asp and core bx today for unexplained leukocytosis despite abxs.  Consent obtained.  Risks and benefits of CT BX was discussed with the patient and/or patient's family including, but not limited to bleeding, infection, damage to adjacent structures or low yield requiring additional tests.  All of the questions were answered and there is agreement to proceed.  Consent signed and in chart.    Thank you for this interesting consult.  I greatly enjoyed meeting Samuel Frank. and look forward to participating in their care.  A copy of this report was sent  to the requesting provider on this date.  Electronically Signed: Greggory Keen, MD 09/04/2018, 9:06 AM   I spent a total of  15 Minutes   in face to face in clinical consultation, greater than 50% of which was counseling/coordinating care for this patient with leukocytosis.

## 2018-09-04 NOTE — Telephone Encounter (Signed)
Per Dr. Janese Banks, patient needs to begin taking his allopurinol 300 mg daily. RX sent.   Uric acid elevated at 11.3.  Left voicemail for patient to return my phone call.  We will try back later.  Faythe Casa, NP 09/04/2018 2:15 PM

## 2018-09-04 NOTE — Procedures (Signed)
Leukocytosis  S/p CT bm asp and core bx  No comp Stable ebl min Path pending Full report in pacs

## 2018-09-04 NOTE — Progress Notes (Signed)
Will do!

## 2018-09-04 NOTE — Progress Notes (Signed)
Hematology/Oncology Consult note [facility] [phone] [phone]  Patient Care Team: Lavera Guise, MD as PCP - General (Internal Medicine)   Name of the patient: Samuel Frank  [id number]  08-Oct-1940    Reason for referral- leucocytosis   Referring physician- Dr. Clayborn Bigness  Date of visit: 09/04/18   History of presenting illness- Patient is a 78 yr old male with no significant co morbidities other than hypertension.  He has recently been having bilateral knee pain and has undergone steroid injection in his knee.Patient was noted to have a high white count on his routine exam.  His white count was 19.2 on 07/22/2018 with an H&H of 11.4/35.1 and a platelet count of 470.  At that point he was prescribed a course of antibiotics and a repeat blood work on 08/21/2018 showed white count of 42.3, H&H of 7.1/23 with an MCV of 84 and a platelet count of 791 and hence the patient was referred to hematology for further management.  Patient currently feels well and denies any changes in his appetite or denies any unintentional weight loss.  Denies any drenching night sweats.  ECOG PS- 1  Pain scale- 0   Review of systems- Review of Systems  Constitutional: Negative for chills, fever, malaise/fatigue and weight loss.  HENT: Negative for congestion, ear discharge and nosebleeds.   Eyes: Negative for blurred vision.  Respiratory: Negative for cough, hemoptysis, sputum production, shortness of breath and wheezing.   Cardiovascular: Negative for chest pain, palpitations, orthopnea and claudication.  Gastrointestinal: Negative for abdominal pain, blood in stool, constipation, diarrhea, heartburn, melena, nausea and vomiting.  Genitourinary: Negative for dysuria, flank pain, frequency, hematuria and urgency.  Musculoskeletal: Negative for back pain, joint pain (Bilateral knee pain) and myalgias.  Skin: Negative for rash.  Neurological: Negative for  dizziness, tingling, focal weakness, seizures, weakness and headaches.  Endo/Heme/Allergies: Does not bruise/bleed easily.  Psychiatric/Behavioral: Negative for depression and suicidal ideas. The patient does not have insomnia.     No Known Allergies  Patient Active Problem List   Diagnosis Date Noted   Left wrist pain 07/16/2018   Leukocytosis 03/25/2018   Acute gouty arthritis 03/25/2018   Need for vaccination against Streptococcus pneumoniae using pneumococcal conjugate vaccine 13 01/16/2018   Encounter for general adult medical examination with abnormal findings 07/13/2017   Dysuria 07/13/2017   Closed fracture of rib of left side with routine healing 06/24/2017   Gastroesophageal reflux disease without esophagitis 06/24/2017   Obesity (BMI 35.0-39.9 without comorbidity) 03/13/2017   Primary osteoarthritis of right hip 03/13/2017   Trochanteric bursitis of right hip 03/13/2017   Other cervical disc degeneration at C5-C6 level 08/31/2016   Polio 08/31/2016   Essential hypertension, benign 04/05/2015   Iron deficiency anemia due to chronic blood loss 04/05/2015     Past Medical History:  Diagnosis Date   Anemia    Hypertension      Past Surgical History:  Procedure Laterality Date   COLONOSCOPY WITH PROPOFOL N/A 05/03/2015   Procedure: COLONOSCOPY WITH PROPOFOL;  Surgeon: [name], MD;  Location: [facility] ENDOSCOPY;  Service: Gastroenterology;  Laterality: N/A;   ESOPHAGOGASTRODUODENOSCOPY (EGD) WITH PROPOFOL N/A 05/03/2015   Procedure: ESOPHAGOGASTRODUODENOSCOPY (EGD) WITH PROPOFOL;  Surgeon: [name], MD;  Location: [facility] ENDOSCOPY;  Service: Gastroenterology;  Laterality: N/A;   HERNIA REPAIR     1975    Social History   Socioeconomic History   Marital status: Divorced    Spouse name: Not on  file   Number of children: 4   Years of education: Not on file   Highest education level: Not on file  Occupational History   Not on file  Social  Needs   Financial resource strain: Not hard at all   Food insecurity    Worry: Never true    Inability: Never true   Transportation needs    Medical: No    Non-medical: No  Tobacco Use   Smoking status: Never Smoker   Smokeless tobacco: Never Used  Substance and Sexual Activity   Alcohol use: No   Drug use: No   Sexual activity: Not on file  Lifestyle   Physical activity    Days per week: 3 days    Minutes per session: 150+ min   Stress: Not at all  Relationships   Social connections    Talks on phone: More than three times a week    Gets together: More than three times a week    Attends religious service: More than 4 times per year    Active member of club or organization: No    Attends meetings of clubs or organizations: Never    Relationship status: Divorced   Intimate partner violence    Fear of current or ex partner: Not on file    Emotionally abused: Not on file    Physically abused: Not on file    Forced sexual activity: Not on file  Other Topics Concern   Not on file  Social History Narrative   Not on file     Family History  Problem Relation Age of Onset   Alzheimer's disease Father    Lung cancer Sister    Cancer Sister      Current Outpatient Medications:    aspirin EC 81 MG tablet, Take 81 mg by mouth daily., Disp: , Rfl:    colchicine 0.6 MG tablet, Take 2 tablets (1.22m) by mouth at first sign of gout flare followed by 1 tablet (0.695m after 1 hour. (Max 1.76m6mithin 1 hour), Disp: , Rfl:    cyanocobalamin 500 MCG tablet, Take 500 mcg by mouth daily., Disp: , Rfl:    cyclobenzaprine (FLEXERIL) 10 MG tablet, Take by mouth., Disp: , Rfl:    ibuprofen (ADVIL,MOTRIN) 200 MG tablet, Take by mouth., Disp: , Rfl:    meloxicam (MOBIC) 15 MG tablet, Take 1 tablet (15 mg total) by mouth daily., Disp: 30 tablet, Rfl: 2   pantoprazole (PROTONIX) 40 MG tablet, Take 1 tablet (40 mg total) by mouth daily., Disp: 90 tablet, Rfl: 1    tiZANidine (ZANAFLEX) 4 MG tablet, Take by mouth., Disp: , Rfl:    valsartan-hydrochlorothiazide (DIOVAN-HCT) 320-25 MG tablet, Take 1 tablet by mouth daily., Disp: 90 tablet, Rfl: 2   allopurinol (ZYLOPRIM) 300 MG tablet, Take 1 tablet (300 mg total) by mouth daily., Disp: 30 tablet, Rfl: 0 No current facility-administered medications for this visit.   Facility-Administered Medications Ordered in Other Visits:    0.9 %  sodium chloride infusion, , Intravenous, Continuous, Louk, Alexandra M, PA-C, Last Rate: 10 mL/hr at 09/04/18 0846   fentaNYL (SUBLIMAZE) 100 MCG/2ML injection, , , ,    heparin lock flush 100 UNIT/ML injection, , , ,    midazolam (VERSED) 5 MG/5ML injection, , , ,    Physical exam:  Vitals:   09/03/18 1359  BP: 110/67  Pulse: (!) 111  Resp: 18  Temp: 99.8 F (37.7 C)  TempSrc: Tympanic  Weight: 291 lb  12.8 oz (132.4 kg)  Height: '6\' 2"'  (1.88 m)   Physical Exam Constitutional:      General: He is not in acute distress. HENT:     Head: Normocephalic and atraumatic.  Eyes:     Pupils: Pupils are equal, round, and reactive to light.  Neck:     Musculoskeletal: Normal range of motion.  Cardiovascular:     Rate and Rhythm: Normal rate and regular rhythm.     Heart sounds: Normal heart sounds.  Pulmonary:     Effort: Pulmonary effort is normal.     Breath sounds: Normal breath sounds.  Abdominal:     General: Bowel sounds are normal.     Palpations: Abdomen is soft.     Comments: No palpable splenomegaly  Lymphadenopathy:     Comments: No palpable cervical, supraclavicular, axillary or inguinal adenopathy   Skin:    General: Skin is warm and dry.  Neurological:     Mental Status: He is alert and oriented to person, place, and time.        CMP Latest Ref Rng & Units 09/03/2018  Glucose 70 - 99 mg/dL 103(H)  BUN 8 - 23 mg/dL 15  Creatinine 0.61 - 1.24 mg/dL 1.21  Sodium 135 - 145 mmol/L 138  Potassium 3.5 - 5.1 mmol/L 3.5  Chloride 98 - 111  mmol/L 103  CO2 22 - 32 mmol/L 25  Calcium 8.9 - 10.3 mg/dL 8.8(L)  Total Protein 6.5 - 8.1 g/dL 8.3(H)  Total Bilirubin 0.3 - 1.2 mg/dL 0.6  Alkaline Phos 38 - 126 U/L 75  AST 15 - 41 U/L 12(L)  ALT 0 - 44 U/L 9   CBC Latest Ref Rng & Units 09/04/2018  WBC 4.0 - 10.5 K/uL 25.2(H)  Hemoglobin 13.0 - 17.0 g/dL 8.1(L)  Hematocrit 39.0 - 52.0 % 26.8(L)  Platelets 150 - 400 K/uL 524(H)    No images are attached to the encounter.  Ct Bone Marrow Biopsy & Aspiration  Result Date: 09/04/2018 INDICATION: UNSPECIFIED LEUKOCYTOSIS, THROMBOCYTOSIS EXAM: CT GUIDED RIGHT ILIAC BONE MARROW ASPIRATION AND CORE BIOPSY Date:  09/04/2018 09/04/2018 10:25 am Radiologist:  M. Daryll Brod, MD Guidance:  CT FLUOROSCOPY TIME:  Fluoroscopy Time: None. MEDICATIONS: 1% lidocaine local ANESTHESIA/SEDATION: 3.5 mg IV Versed; 75 mcg IV Fentanyl Moderate Sedation Time:  14 minutes The patient was continuously monitored during the procedure by the interventional radiology nurse under my direct supervision. CONTRAST:  None. COMPLICATIONS: None PROCEDURE: Informed consent was obtained from the patient following explanation of the procedure, risks, benefits and alternatives. The patient understands, agrees and consents for the procedure. All questions were addressed. A time out was performed. The patient was positioned prone and non-contrast localization CT was performed of the pelvis to demonstrate the iliac marrow spaces. Maximal barrier sterile technique utilized including caps, mask, sterile gowns, sterile gloves, large sterile drape, hand hygiene, and Betadine prep. Under sterile conditions and local anesthesia, an 11 gauge coaxial bone biopsy needle was advanced into the right iliac marrow space. Needle position was confirmed with CT imaging. Initially, bone marrow aspiration was performed. Next, the 11 gauge outer cannula was utilized to obtain a right iliac bone marrow core biopsy. Needle was removed. Hemostasis was obtained  with compression. The patient tolerated the procedure well. Samples were prepared with the cytotechnologist. No immediate complications. IMPRESSION: CT guided right iliac bone marrow aspiration and core biopsy. Electronically Signed   By: Jerilynn Mages.  Shick M.D.   On: 09/04/2018 10:30    Assessment  and plan- Patient is a 78 y.o. male referred for leukocytosis and thrombocytosis  In May 2020 patient's white count was 19 which is increased to 70 recently.  Also his hemoglobin dropped from 11-7 and his platelet count is high at 790.  This is concerning for a myeloproliferative disorder.  At this time I will proceed with a CBC with differential , Flow cytometry, BCR ABL fish testing, smear review, also check uric acid CMP as well as a comprehensive work-up for his anemia including ferritin and iron studies, B12 and folate, TSH, haptoglobin, LDH and reticulocyte count.  Given that his counts are reasonably high I would also like to get a simultaneous bone marrow biopsy to rule out any underlying leukemia or myeloproliferative disorder.  Discussed risks and benefits of bone marrow biopsy including all but not limited to possible risk of bleeding infection.  Bone marrow biopsy will be done IR.  Patient understands and agrees to proceed.  We will plan to send NGS testing on the bone marrow specimen depending on the findings.  I will tentatively see the patient back in 2 weeks time after the results of his bone marrow biopsy are back   Thank you for this kind referral and the opportunity to participate in the care of this  Patient   Visit Diagnosis 1. Thrombocytosis (HCC)   2. Leukocytosis, unspecified type   3. Normocytic anemia     Dr. Randa Evens, MD, MPH [facility] [phone] 09/04/2018  8:19 PM

## 2018-09-05 LAB — COMP PANEL: LEUKEMIA/LYMPHOMA

## 2018-09-08 ENCOUNTER — Encounter: Payer: Self-pay | Admitting: Oncology

## 2018-09-09 LAB — JAK2 GENOTYPR

## 2018-09-11 ENCOUNTER — Encounter (HOSPITAL_COMMUNITY): Payer: Self-pay | Admitting: Oncology

## 2018-09-12 ENCOUNTER — Inpatient Hospital Stay: Payer: Medicare Other | Attending: Oncology | Admitting: Oncology

## 2018-09-12 ENCOUNTER — Other Ambulatory Visit: Payer: Self-pay

## 2018-09-12 ENCOUNTER — Encounter: Payer: Self-pay | Admitting: Oncology

## 2018-09-12 VITALS — BP 100/67 | HR 102 | Temp 97.6°F | Resp 18 | Wt 294.2 lb

## 2018-09-12 DIAGNOSIS — C9310 Chronic myelomonocytic leukemia not having achieved remission: Secondary | ICD-10-CM | POA: Insufficient documentation

## 2018-09-12 DIAGNOSIS — Z5111 Encounter for antineoplastic chemotherapy: Secondary | ICD-10-CM | POA: Insufficient documentation

## 2018-09-12 DIAGNOSIS — Z7189 Other specified counseling: Secondary | ICD-10-CM | POA: Diagnosis not present

## 2018-09-12 DIAGNOSIS — R634 Abnormal weight loss: Secondary | ICD-10-CM | POA: Diagnosis not present

## 2018-09-12 DIAGNOSIS — I10 Essential (primary) hypertension: Secondary | ICD-10-CM | POA: Insufficient documentation

## 2018-09-12 NOTE — Patient Instructions (Signed)
Azacitidine suspension for injection (subcutaneous use) What is this medicine? AZACITIDINE (ay za SITE i deen) is a chemotherapy drug. This medicine reduces the growth of cancer cells and can suppress the immune system. It is used for treating myelodysplastic syndrome or some types of leukemia. This medicine may be used for other purposes; ask your health care provider or pharmacist if you have questions. COMMON BRAND NAME(S): Vidaza What should I tell my health care provider before I take this medicine? They need to know if you have any of these conditions:  kidney disease  liver disease  liver tumors  an unusual or allergic reaction to azacitidine, mannitol, other medicines, foods, dyes, or preservatives  pregnant or trying to get pregnant  breast-feeding How should I use this medicine? This medicine is for injection under the skin. It is administered in a hospital or clinic by a specially trained health care professional. Talk to your pediatrician regarding the use of this medicine in children. While this drug may be prescribed for selected conditions, precautions do apply. Overdosage: If you think you have taken too much of this medicine contact a poison control center or emergency room at once. NOTE: This medicine is only for you. Do not share this medicine with others. What if I miss a dose? It is important not to miss your dose. Call your doctor or health care professional if you are unable to keep an appointment. What may interact with this medicine? Interactions have not been studied. Give your health care provider a list of all the medicines, herbs, non-prescription drugs, or dietary supplements you use. Also tell them if you smoke, drink alcohol, or use illegal drugs. Some items may interact with your medicine. This list may not describe all possible interactions. Give your health care provider a list of all the medicines, herbs, non-prescription drugs, or dietary supplements  you use. Also tell them if you smoke, drink alcohol, or use illegal drugs. Some items may interact with your medicine. What should I watch for while using this medicine? Visit your doctor for checks on your progress. This drug may make you feel generally unwell. This is not uncommon, as chemotherapy can affect healthy cells as well as cancer cells. Report any side effects. Continue your course of treatment even though you feel ill unless your doctor tells you to stop. In some cases, you may be given additional medicines to help with side effects. Follow all directions for their use. Call your doctor or health care professional for advice if you get a fever, chills or sore throat, or other symptoms of a cold or flu. Do not treat yourself. This drug decreases your body's ability to fight infections. Try to avoid being around people who are sick. This medicine may increase your risk to bruise or bleed. Call your doctor or health care professional if you notice any unusual bleeding. You may need blood work done while you are taking this medicine. Do not become pregnant while taking this medicine and for 6 months after the last dose. Women should inform their doctor if they wish to become pregnant or think they might be pregnant. Men should not father a child while taking this medicine and for 3 months after the last dose. There is a potential for serious side effects to an unborn child. Talk to your health care professional or pharmacist for more information. Do not breast-feed an infant while taking this medicine and for 1 week after the last dose. This medicine may interfere with   the ability to have a child. Talk with your doctor or health care professional if you are concerned about your fertility. What side effects may I notice from receiving this medicine? Side effects that you should report to your doctor or health care professional as soon as possible:  allergic reactions like skin rash, itching or  hives, swelling of the face, lips, or tongue  low blood counts - this medicine may decrease the number of white blood cells, red blood cells and platelets. You may be at increased risk for infections and bleeding.  signs of infection - fever or chills, cough, sore throat, pain passing urine  signs of decreased platelets or bleeding - bruising, pinpoint red spots on the skin, black, tarry stools, blood in the urine  signs of decreased red blood cells - unusually weak or tired, fainting spells, lightheadedness  signs and symptoms of kidney injury like trouble passing urine or change in the amount of urine  signs and symptoms of liver injury like dark yellow or brown urine; general ill feeling or flu-like symptoms; light-colored stools; loss of appetite; nausea; right upper belly pain; unusually weak or tired; yellowing of the eyes or skin Side effects that usually do not require medical attention (report to your doctor or health care professional if they continue or are bothersome):  constipation  diarrhea  nausea, vomiting  pain or redness at the injection site  unusually weak or tired This list may not describe all possible side effects. Call your doctor for medical advice about side effects. You may report side effects to FDA at 1-800-FDA-1088. Where should I keep my medicine? This drug is given in a hospital or clinic and will not be stored at home. NOTE: This sheet is a summary. It may not cover all possible information. If you have questions about this medicine, talk to your doctor, pharmacist, or health care provider.  2020 Elsevier/Gold Standard (2016-03-21 14:37:51)  

## 2018-09-12 NOTE — Progress Notes (Signed)
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  He has recently been having bilateral knee pain and has undergone steroid injection in his knee.Patient was noted to have a high white count on his routine exam.  His white count was 19.2 on 07/22/2018 with an H&H of 11.4/35.1 and a platelet count of 470.  At that point he was prescribed a course of antibiotics and a repeat blood work on 08/21/2018 showed white count of 42.3, H&H of 7.1/23 with an MCV of 84 and a platelet count of 791 and hence the patient was referred to hematology for further management.    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  In addition the core biopsy showed a small focus of fibrosis containing scattering of mast cells and eosinophils most suggestive of systemic mastocytosis.bone marrow blasts 7%.  Flow cytometry showed increased number of monocytic cells representing 16% of all cells with expression for HLA-DR, CD11b, CD13, CD14, CD33, CD38, CD56 and CD56 and CD64 with [facility] CD 117 are CD34.  NGS and cytogenetics pending.  Interval history-patient reports he has had a 15 pound weight loss in the last 1 month.  Reports fatigue.  Denies any drenching night sweats, fever, cough shortness of breath or abdominal pain  ECOG PS- 1 Pain scale- 0 Opioid associated constipation- no  Review of systems- Review of Systems  Constitutional: Positive for malaise/fatigue and  weight loss. Negative for chills and fever.  HENT: Negative for congestion, ear discharge and nosebleeds.   Eyes: Negative for blurred vision.  Respiratory: Negative for cough, hemoptysis, sputum production, shortness of breath and wheezing.   Cardiovascular: Negative for chest pain, palpitations, orthopnea and claudication.  Gastrointestinal: Negative for abdominal pain, blood in stool, constipation, diarrhea, heartburn, melena, nausea and vomiting.  Genitourinary: Negative for dysuria, flank pain, frequency, hematuria and urgency.  Musculoskeletal: Negative for back pain, joint pain and myalgias.  Skin: Negative for rash.  Neurological: Negative for dizziness, tingling, focal weakness, seizures, weakness and headaches.  Endo/Heme/Allergies: Does not bruise/bleed easily.  Psychiatric/Behavioral: Negative for depression and suicidal ideas. The patient does not have insomnia.       No Known Allergies   Past Medical History:  Diagnosis Date  . Anemia   . Hypertension      Past Surgical History:  Procedure Laterality Date  . COLONOSCOPY WITH PROPOFOL N/A 05/03/2015   Procedure: COLONOSCOPY WITH PROPOFOL;  Surgeon: [name], MD;  Location: [facility] ENDOSCOPY;  Service: Gastroenterology;  Laterality: N/A;  . ESOPHAGOGASTRODUODENOSCOPY (EGD) WITH PROPOFOL N/A 05/03/2015   Procedure: ESOPHAGOGASTRODUODENOSCOPY (EGD) WITH PROPOFOL;  Surgeon: [name], MD;  Location: [facility] ENDOSCOPY;  Service: Gastroenterology;  Laterality: N/A;  . HERNIA REPAIR     1975    Social History   Socioeconomic History  . Marital status: Divorced    Spouse name: Not on file  . Number of children: 4  . Years of education: Not on file  . Highest education level: Not on file  Occupational History  . Not on file  Social Needs  . Financial resource strain: Not hard at all  . Food insecurity    Worry: Never true    Inability: Never true  . Transportation needs    Medical: No    Non-medical: No  Tobacco Use   . Smoking status: Never Smoker  . Smokeless tobacco: Never Used  Substance and Sexual Activity  . Alcohol use: No  . Drug use: No  . Sexual activity: Not on file  Lifestyle  . Physical activity    Days per week: 3 days    Minutes per session: 150+ min  . Stress: Not at all  Relationships  . Social connections    Talks on phone: More than three times a week    Gets together: More than three times a week    Attends religious service: More than 4 times per year    Active member of club or organization: No    Attends meetings of clubs or organizations: Never    Relationship status: Divorced  . Intimate partner violence    Fear of current or ex partner: Not on file    Emotionally abused: Not on file    Physically abused: Not on file    Forced sexual activity: Not on file  Other Topics Concern  . Not on file  Social History Narrative  . Not on file    Family History  Problem Relation Age of Onset  . Alzheimer's disease Father   . Lung cancer Sister   . Cancer Sister      Current Outpatient Medications:  .  allopurinol (ZYLOPRIM) 300 MG tablet, Take 1 tablet (300 mg total) by mouth daily., Disp: 30 tablet, Rfl: 0 .  aspirin EC 81 MG tablet, Take 81 mg by mouth daily., Disp: , Rfl:  .  colchicine 0.6 MG tablet, Take 2 tablets (1.476m) by mouth at first sign of gout flare followed by 1 tablet (0.667m after 1 hour. (Max 1.76m39mithin 1 hour), Disp: , Rfl:  .  cyanocobalamin 500 MCG tablet, Take 500 mcg by mouth daily., Disp: , Rfl:  .  cyclobenzaprine (FLEXERIL) 10 MG tablet, Take by mouth., Disp: , Rfl:  .  ibuprofen (ADVIL,MOTRIN) 200 MG tablet, Take by mouth., Disp: , Rfl:  .  pantoprazole (PROTONIX) 40 MG tablet, Take 1 tablet (40 mg total) by mouth daily., Disp: 90 tablet, Rfl: 1 .  tiZANidine (ZANAFLEX) 4 MG tablet, Take by mouth., Disp: , Rfl:  .  valsartan-hydrochlorothiazide (DIOVAN-HCT) 320-25 MG tablet, Take 1 tablet by mouth daily., Disp: 90 tablet, Rfl: 2  Physical  exam: There were no vitals filed for this visit. Physical Exam Constitutional:      Appearance: He is obese.  HENT:     Head: Normocephalic and atraumatic.  Eyes:     Pupils: Pupils are equal, round, and reactive to light.  Neck:     Musculoskeletal: Normal range of motion.  Cardiovascular:     Rate and Rhythm: Normal rate and regular rhythm.     Heart sounds: Normal heart sounds.  Pulmonary:     Effort: Pulmonary effort is normal.     Breath sounds: Normal breath sounds.  Abdominal:     General: Bowel sounds are normal. There is no distension.     Palpations: Abdomen is soft.     Tenderness: There is no abdominal tenderness.  Skin:    General: Skin is warm and dry.  Neurological:  Mental Status: He is alert and oriented to person, place, and time.      CMP Latest Ref Rng & Units 09/03/2018  Glucose 70 - 99 mg/dL 103(H)  BUN 8 - 23 mg/dL 15  Creatinine 0.61 - 1.24 mg/dL 1.21  Sodium 135 - 145 mmol/L 138  Potassium 3.5 - 5.1 mmol/L 3.5  Chloride 98 - 111 mmol/L 103  CO2 22 - 32 mmol/L 25  Calcium 8.9 - 10.3 mg/dL 8.8(L)  Total Protein 6.5 - 8.1 g/dL 8.3(H)  Total Bilirubin 0.3 - 1.2 mg/dL 0.6  Alkaline Phos 38 - 126 U/L 75  AST 15 - 41 U/L 12(L)  ALT 0 - 44 U/L 9   CBC Latest Ref Rng & Units 09/04/2018  WBC 4.0 - 10.5 K/uL 25.2(H)  Hemoglobin 13.0 - 17.0 g/dL 8.1(L)  Hematocrit 39.0 - 52.0 % 26.8(L)  Platelets 150 - 400 K/uL 524(H)    No images are attached to the encounter.  Ct Bone Marrow Biopsy & Aspiration  Result Date: 09/04/2018 INDICATION: UNSPECIFIED LEUKOCYTOSIS, THROMBOCYTOSIS EXAM: CT GUIDED RIGHT ILIAC BONE MARROW ASPIRATION AND CORE BIOPSY Date:  09/04/2018 09/04/2018 10:25 am Radiologist:  M. Daryll Brod, MD Guidance:  CT FLUOROSCOPY TIME:  Fluoroscopy Time: None. MEDICATIONS: 1% lidocaine local ANESTHESIA/SEDATION: 3.5 mg IV Versed; 75 mcg IV Fentanyl Moderate Sedation Time:  14 minutes The patient was continuously monitored during the procedure by the  interventional radiology nurse under my direct supervision. CONTRAST:  None. COMPLICATIONS: None PROCEDURE: Informed consent was obtained from the patient following explanation of the procedure, risks, benefits and alternatives. The patient understands, agrees and consents for the procedure. All questions were addressed. A time out was performed. The patient was positioned prone and non-contrast localization CT was performed of the pelvis to demonstrate the iliac marrow spaces. Maximal barrier sterile technique utilized including caps, mask, sterile gowns, sterile gloves, large sterile drape, hand hygiene, and Betadine prep. Under sterile conditions and local anesthesia, an 11 gauge coaxial bone biopsy needle was advanced into the right iliac marrow space. Needle position was confirmed with CT imaging. Initially, bone marrow aspiration was performed. Next, the 11 gauge outer cannula was utilized to obtain a right iliac bone marrow core biopsy. Needle was removed. Hemostasis was obtained with compression. The patient tolerated the procedure well. Samples were prepared with the cytotechnologist. No immediate complications. IMPRESSION: CT guided right iliac bone marrow aspiration and core biopsy. Electronically Signed   By: Jerilynn Mages.  Shick M.D.   On: 09/04/2018 10:30     Assessment and plan- Patient is a 78 y.o. male referred for leukocytosis/monocytosis, thrombocytopenia and normocytic anemia.Bone marrow biopsy shows features of CMML 1 patient is here to discuss the results of bone marrow biopsy and further management.  Patient underwent bone marrow biopsy on 09/04/2018.  It showed features of MDS/MPN particularly CMML 1.  In addition the core biopsy showed a small focus of fibrosis containing scattering of mast cells and eosinophils most suggestive of systemic mastocytosis.  Bone marrow blasts were 7%.  Progressive maturation with scattering maturing cells displaying nuclear cytoplasmic dyssynchrony or irregular  lobulated nuclei were present in erythroid precursors.  The male prognostic scoring system puts him at an intermediate score of 1 with an average median survival of 18.5 months.  NGS testing is currently pending.  Given his significant anemia as well as 7% blasts in his bone marrow and ongoing weight loss I would recommend palliative chemotherapy with Vidaza day 1 to day 7 every 28 days.  Discussed  natural history of CMML and the risk of progression to acute leukemia.  Discussed that treatment would only be palliative and not curative.  The only potential cure for CMML is aloe transplant but given patient's age I do not think he is a candidate for allo transplant.  Patient does have a small focus of systemic mastocytosis noted in his bone marrow as well but predominantly his bone marrow is involved with CMML which needs treatment.  Discussed referral to [facility] for second opinion and/or clinical trials for the same.  Patient is agreeable.  I have discussed all this with patient's daughter over the phone as well.  Patient noted to have a baseline uric acid that was elevated at 11.3 and he will be taking allopurinol 300 mg daily.  Patient also had a comprehensive anemia work-up including iron studies, haptoglobin, B12 and folate as well as TSH which did not reveal any other reversible cause for his anemia.  I will obtain ultrasound abdomen to evaluate for splenomegaly.  Iron studies did show a low iron saturation of 8% with a TIBC of 264 and ferritin levels were 45 indicating anemia of chronic disease.  I will consider a trial of IV iron along with Vidaza  I will tentatively plan to start [place] in 2 to 3 weeks time.  I will check an interim CBC in 10 days.  I will refer him to Dr. Janene Madeira from [facility] for CMML as well.   Total face to face encounter time for this patient visit was 40 min. >50% of the time was  spent in counseling and coordination of care.     Visit Diagnosis 1. 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 Goals of care, counseling/discussion      Dr. Randa Evens, MD, MPH [facility] at [facility] [phone] 09/12/2018 8:00 AM

## 2018-09-12 NOTE — Progress Notes (Signed)
Discussed with patient about chemotherapy education class.  Binder given with Vidaza information along with diagnosis.   Will do patient chemotherapy education next Tuesday 09/17/2018 at 2:00 pm.

## 2018-09-12 NOTE — Progress Notes (Signed)
Pt in for follow up, denies any concerns.  Anxious about test results today.

## 2018-09-13 DIAGNOSIS — C9310 Chronic myelomonocytic leukemia not having achieved remission: Secondary | ICD-10-CM | POA: Insufficient documentation

## 2018-09-13 LAB — BCR-ABL1 FISH
Cells Analyzed: 200
Cells Counted: 200

## 2018-09-13 MED ORDER — PROCHLORPERAZINE MALEATE 10 MG PO TABS: 10.0000 mg | ORAL_TABLET | Freq: Four times a day (QID) | ORAL | 1 refills | Status: DC | PRN

## 2018-09-13 MED ORDER — ONDANSETRON HCL 8 MG PO TABS: 8.0000 mg | ORAL_TABLET | Freq: Two times a day (BID) | ORAL | 1 refills | Status: DC | PRN

## 2018-09-16 ENCOUNTER — Other Ambulatory Visit: Payer: Self-pay | Admitting: *Deleted

## 2018-09-16 ENCOUNTER — Encounter: Payer: Self-pay | Admitting: Oncology

## 2018-09-16 ENCOUNTER — Ambulatory Visit: Payer: Medicare Other | Admitting: Oncology

## 2018-09-16 DIAGNOSIS — C9310 Chronic myelomonocytic leukemia not having achieved remission: Secondary | ICD-10-CM

## 2018-09-17 ENCOUNTER — Inpatient Hospital Stay: Payer: Medicare Other

## 2018-09-17 DIAGNOSIS — C9312 Chronic myelomonocytic leukemia, in relapse: Principal | ICD-10-CM

## 2018-09-20 ENCOUNTER — Inpatient Hospital Stay: Payer: Medicare Other

## 2018-09-20 ENCOUNTER — Other Ambulatory Visit: Payer: Self-pay

## 2018-09-20 DIAGNOSIS — Z7189 Other specified counseling: Secondary | ICD-10-CM | POA: Diagnosis not present

## 2018-09-20 DIAGNOSIS — C9310 Chronic myelomonocytic leukemia not having achieved remission: Secondary | ICD-10-CM | POA: Diagnosis not present

## 2018-09-20 DIAGNOSIS — I10 Essential (primary) hypertension: Secondary | ICD-10-CM | POA: Diagnosis not present

## 2018-09-20 DIAGNOSIS — Z5111 Encounter for antineoplastic chemotherapy: Secondary | ICD-10-CM | POA: Diagnosis not present

## 2018-09-20 LAB — BASIC METABOLIC PANEL
Anion gap: 9 (ref 5–15)
BUN: 14 mg/dL (ref 8–23)
CO2: 24 mmol/L (ref 22–32)
Calcium: 9.2 mg/dL (ref 8.9–10.3)
Chloride: 105 mmol/L (ref 98–111)
Creatinine, Ser: 1.12 mg/dL (ref 0.61–1.24)
GFR calc Af Amer: >60 mL/min (ref >60)
GFR calc non Af Amer: >60 mL/min (ref >60)
Glucose, Bld: 106 mg/dL — ABNORMAL HIGH (ref 70–99)
Potassium: 3.4 mmol/L — ABNORMAL LOW (ref 3.5–5.1)
Sodium: 138 mmol/L (ref 135–145)

## 2018-09-20 LAB — CBC WITH DIFFERENTIAL/PLATELET
Abs Immature Granulocytes: 0.06 10*3/uL (ref 0.00–0.07)
Basophils Absolute: 0 10*3/uL (ref 0.0–0.1)
Basophils Relative: 1 %
Eosinophils Absolute: 0.2 10*3/uL (ref 0.0–0.5)
Eosinophils Relative: 2 %
HCT: 27.2 % — ABNORMAL LOW (ref 39.0–52.0)
Hemoglobin: 8.3 g/dL — ABNORMAL LOW (ref 13.0–17.0)
Immature Granulocytes: 1 %
Lymphocytes Relative: 20 %
Lymphs Abs: 1.8 10*3/uL (ref 0.7–4.0)
MCH: 26.3 pg (ref 26.0–34.0)
MCHC: 30.5 g/dL (ref 30.0–36.0)
MCV: 86.3 fL (ref 80.0–100.0)
Monocytes Absolute: 1.5 10*3/uL — ABNORMAL HIGH (ref 0.1–1.0)
Monocytes Relative: 17 %
Neutro Abs: 5.2 10*3/uL (ref 1.7–7.7)
Neutrophils Relative %: 59 %
Platelets: 618 10*3/uL — ABNORMAL HIGH (ref 150–400)
RBC: 3.15 MIL/uL — ABNORMAL LOW (ref 4.22–5.81)
RDW: 22.4 % — ABNORMAL HIGH (ref 11.5–15.5)
WBC: 8.9 10*3/uL (ref 4.0–10.5)
nRBC: 0.6 % — ABNORMAL HIGH (ref 0.0–0.2)

## 2018-09-23 ENCOUNTER — Encounter (HOSPITAL_COMMUNITY): Payer: Self-pay | Admitting: Oncology

## 2018-09-26 ENCOUNTER — Encounter: Admit: 2018-09-26 | Discharge: 2018-09-27 | Payer: MEDICARE

## 2018-09-26 ENCOUNTER — Encounter
Admit: 2018-09-26 | Discharge: 2018-09-27 | Payer: MEDICARE | Attending: Hematology & Oncology | Primary: Hematology & Oncology

## 2018-09-26 ENCOUNTER — Other Ambulatory Visit: Payer: Medicare Other

## 2018-09-26 ENCOUNTER — Ambulatory Visit: Payer: Medicare Other

## 2018-09-26 ENCOUNTER — Ambulatory Visit: Payer: Medicare Other | Admitting: Oncology

## 2018-09-26 DIAGNOSIS — C9310 Chronic myelomonocytic leukemia not having achieved remission: Secondary | ICD-10-CM

## 2018-09-26 DIAGNOSIS — C9110 Chronic lymphocytic leukemia of B-cell type not having achieved remission: Principal | ICD-10-CM

## 2018-09-26 MED ORDER — VALACYCLOVIR 500 MG TABLET
ORAL_TABLET | Freq: Every day | ORAL | 0 refills | 30 days | Status: CP
Start: 2018-09-26 — End: 2018-10-26

## 2018-09-27 ENCOUNTER — Ambulatory Visit: Payer: Medicare Other

## 2018-09-27 ENCOUNTER — Other Ambulatory Visit: Payer: Self-pay | Admitting: Oncology

## 2018-09-30 ENCOUNTER — Other Ambulatory Visit: Payer: Self-pay | Admitting: *Deleted

## 2018-09-30 ENCOUNTER — Inpatient Hospital Stay: Payer: Medicare Other

## 2018-09-30 ENCOUNTER — Inpatient Hospital Stay (HOSPITAL_BASED_OUTPATIENT_CLINIC_OR_DEPARTMENT_OTHER): Payer: Medicare Other | Admitting: Oncology

## 2018-09-30 ENCOUNTER — Encounter: Payer: Self-pay | Admitting: Oncology

## 2018-09-30 ENCOUNTER — Other Ambulatory Visit: Payer: Self-pay

## 2018-09-30 ENCOUNTER — Inpatient Hospital Stay: Payer: Medicare Other | Admitting: *Deleted

## 2018-09-30 VITALS — BP 118/74 | HR 97 | Temp 98.4°F | Resp 16 | Ht 74.5 in | Wt 306.3 lb

## 2018-09-30 DIAGNOSIS — C9310 Chronic myelomonocytic leukemia not having achieved remission: Secondary | ICD-10-CM

## 2018-09-30 DIAGNOSIS — E876 Hypokalemia: Secondary | ICD-10-CM

## 2018-09-30 DIAGNOSIS — I10 Essential (primary) hypertension: Secondary | ICD-10-CM | POA: Diagnosis not present

## 2018-09-30 DIAGNOSIS — Z5111 Encounter for antineoplastic chemotherapy: Secondary | ICD-10-CM | POA: Diagnosis not present

## 2018-09-30 DIAGNOSIS — Z7189 Other specified counseling: Secondary | ICD-10-CM | POA: Diagnosis not present

## 2018-09-30 LAB — CBC WITH DIFFERENTIAL/PLATELET
Abs Immature Granulocytes: 0.26 10*3/uL — ABNORMAL HIGH (ref 0.00–0.07)
Basophils Absolute: 0.1 10*3/uL (ref 0.0–0.1)
Basophils Relative: 1 %
Eosinophils Absolute: 0.2 10*3/uL (ref 0.0–0.5)
Eosinophils Relative: 2 %
HCT: 27.1 % — ABNORMAL LOW (ref 39.0–52.0)
Hemoglobin: 8.2 g/dL — ABNORMAL LOW (ref 13.0–17.0)
Immature Granulocytes: 2 %
Lymphocytes Relative: 16 %
Lymphs Abs: 2 10*3/uL (ref 0.7–4.0)
MCH: 26.4 pg (ref 26.0–34.0)
MCHC: 30.3 g/dL (ref 30.0–36.0)
MCV: 87.1 fL (ref 80.0–100.0)
Monocytes Absolute: 2.9 10*3/uL — ABNORMAL HIGH (ref 0.1–1.0)
Monocytes Relative: 23 %
Neutro Abs: 7.4 10*3/uL (ref 1.7–7.7)
Neutrophils Relative %: 56 %
Platelets: 365 10*3/uL (ref 150–400)
RBC: 3.11 MIL/uL — ABNORMAL LOW (ref 4.22–5.81)
RDW: 22.1 % — ABNORMAL HIGH (ref 11.5–15.5)
WBC: 12.9 10*3/uL — ABNORMAL HIGH (ref 4.0–10.5)
nRBC: 0.2 % (ref 0.0–0.2)

## 2018-09-30 LAB — BASIC METABOLIC PANEL
Anion gap: 8 (ref 5–15)
BUN: 10 mg/dL (ref 8–23)
CO2: 29 mmol/L (ref 22–32)
Calcium: 8.9 mg/dL (ref 8.9–10.3)
Chloride: 104 mmol/L (ref 98–111)
Creatinine, Ser: 1.06 mg/dL (ref 0.61–1.24)
GFR calc Af Amer: >60 mL/min (ref >60)
GFR calc non Af Amer: >60 mL/min (ref >60)
Glucose, Bld: 100 mg/dL — ABNORMAL HIGH (ref 70–99)
Potassium: 2.8 mmol/L — ABNORMAL LOW (ref 3.5–5.1)
Sodium: 141 mmol/L (ref 135–145)

## 2018-09-30 MED ORDER — VALACYCLOVIR HCL 500 MG PO TABS: 500.0000 mg | ORAL_TABLET | Freq: Every day | ORAL | 3 refills | Status: DC

## 2018-09-30 MED ORDER — POTASSIUM CHLORIDE CRYS ER 20 MEQ PO TBCR: 20.0000 meq | EXTENDED_RELEASE_TABLET | Freq: Two times a day (BID) | ORAL | 0 refills | Status: DC

## 2018-09-30 MED ORDER — ONDANSETRON HCL 4 MG PO TABS
8.0000 mg | ORAL_TABLET | Freq: Once | ORAL | Status: AC
  Administered 2018-09-30: 8 mg via ORAL
  Filled 2018-09-30: qty 2

## 2018-09-30 MED ORDER — ALLOPURINOL 300 MG PO TABS: 300.0000 mg | ORAL_TABLET | Freq: Every day | ORAL | 3 refills | Status: DC

## 2018-09-30 MED ORDER — AZACITIDINE CHEMO SQ INJECTION
75.0000 mg/m2 | Freq: Once | INTRAMUSCULAR | Status: AC
  Administered 2018-09-30: 200 mg via SUBCUTANEOUS
  Filled 2018-09-30: qty 8

## 2018-09-30 NOTE — Progress Notes (Signed)
Potassium: 2.8. Novella Olive, RN, notified MD, Dr. Janese Banks, via telephone. Per MD order: proceed with scheduled Vidaza treatment today. MD to call in prescription to patient's pharmacy for Potassium Chloride SA (K-DUR) 21meq PO BID. Patient educated and verbalized understanding.

## 2018-09-30 NOTE — Progress Notes (Signed)
Pt her to start his first treatment of vidaza

## 2018-10-01 ENCOUNTER — Inpatient Hospital Stay: Payer: Medicare Other

## 2018-10-01 ENCOUNTER — Other Ambulatory Visit: Payer: Self-pay

## 2018-10-01 VITALS — BP 121/70 | HR 100 | Temp 99.4°F | Resp 20

## 2018-10-01 DIAGNOSIS — C9310 Chronic myelomonocytic leukemia not having achieved remission: Secondary | ICD-10-CM | POA: Diagnosis not present

## 2018-10-01 DIAGNOSIS — Z7189 Other specified counseling: Secondary | ICD-10-CM | POA: Diagnosis not present

## 2018-10-01 DIAGNOSIS — Z5111 Encounter for antineoplastic chemotherapy: Secondary | ICD-10-CM | POA: Diagnosis not present

## 2018-10-01 DIAGNOSIS — I10 Essential (primary) hypertension: Secondary | ICD-10-CM | POA: Diagnosis not present

## 2018-10-01 MED ORDER — AZACITIDINE CHEMO SQ INJECTION
75.0000 mg/m2 | Freq: Once | INTRAMUSCULAR | Status: AC
  Administered 2018-10-01: 200 mg via SUBCUTANEOUS
  Filled 2018-10-01: qty 8

## 2018-10-01 MED ORDER — ONDANSETRON HCL 4 MG PO TABS
8.0000 mg | ORAL_TABLET | Freq: Once | ORAL | Status: AC
  Administered 2018-10-01: 14:00:00 8 mg via ORAL
  Filled 2018-10-01: qty 2

## 2018-10-01 NOTE — Progress Notes (Signed)
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 He has recently been having bilateral knee pain and has undergone steroid injection in his knee.Patient was noted to have a high white count on his routine exam. His white count was 19.2 on 07/22/2018 with an H&H of 11.4/35.1 and a platelet count of 470. At that point he was prescribed a course of antibiotics and a repeat blood work on 08/21/2018 showed white count of 42.3, H&H of 7.1/23 with an MCV of 84 and a platelet count of 791 and hence the patient was referred to hematology for further management.   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  In addition the core biopsy showed a small focus of fibrosis containing scattering of mast cells and eosinophils most suggestive of systemic mastocytosis.bone marrow blasts 7%.  Flow cytometry showed increased number of monocytic cells representing 16% of all cells with expression for HLA-DR, CD11b, CD13, CD14, CD33, CD38, CD56 and CD56 and CD64 with [facility] CD 117 are CD34. normal cytogenetics, CBL, SRSF2, SH2B3 and TET2    Interval history-he feels well today overall other than mild fatigue he denies any other complaints at this time.  ECOG PS- 1 Pain scale- 0 Opioid associated constipation- no  Review of systems- Review of Systems  Constitutional: Positive for malaise/fatigue. Negative for chills, fever and weight loss.  HENT: Negative  for congestion, ear discharge and nosebleeds.   Eyes: Negative for blurred vision.  Respiratory: Negative for cough, hemoptysis, sputum production, shortness of breath and wheezing.   Cardiovascular: Negative for chest pain, palpitations, orthopnea and claudication.  Gastrointestinal: Negative for abdominal pain, blood in stool, constipation, diarrhea, heartburn, melena, nausea and vomiting.  Genitourinary: Negative for dysuria, flank pain, frequency, hematuria and urgency.  Musculoskeletal: Negative for back pain, joint pain and myalgias.  Skin: Negative for rash.  Neurological: Negative for dizziness, tingling, focal weakness, seizures, weakness and headaches.  Endo/Heme/Allergies: Does not bruise/bleed easily.  Psychiatric/Behavioral: Negative for depression and suicidal ideas. The patient does not have insomnia.        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[place])    Hypertension      Past Surgical History:  Procedure Laterality Date   COLONOSCOPY WITH PROPOFOL N/A 05/03/2015   Procedure: COLONOSCOPY WITH PROPOFOL;  Surgeon: [name], MD;  Location: [facility] ENDOSCOPY;  Service: Gastroenterology;  Laterality: N/A;   ESOPHAGOGASTRODUODENOSCOPY (EGD) WITH PROPOFOL N/A 05/03/2015   Procedure: ESOPHAGOGASTRODUODENOSCOPY (EGD) WITH PROPOFOL;  Surgeon: [name], MD;  Location: [facility] ENDOSCOPY;  Service: Gastroenterology;  Laterality: N/A;   HERNIA REPAIR     1975    Social History   Socioeconomic History   Marital status: Divorced    Spouse name: Not on file   Number of children: 4   Years of education: Not on file   Highest education level: Not on file  Occupational History  Not on file  Social Needs   Financial resource strain: Not hard at all   Food insecurity    Worry: Never true    Inability: Never true   Transportation needs    Medical: No    Non-medical: No  Tobacco Use    Smoking status: Never Smoker   Smokeless tobacco: Never Used  Substance and Sexual Activity   Alcohol use: No   Drug use: No   Sexual activity: Not on file  Lifestyle   Physical activity    Days per week: 3 days    Minutes per session: 150+ min   Stress: Not at all  Relationships   Social connections    Talks on phone: More than three times a week    Gets together: More than three times a week    Attends religious service: More than 4 times per year    Active member of club or organization: No    Attends meetings of clubs or organizations: Never    Relationship status: Divorced   Intimate partner violence    Fear of current or ex partner: Not on file    Emotionally abused: Not on file    Physically abused: Not on file    Forced sexual activity: Not on file  Other Topics Concern   Not on file  Social History Narrative   Not on file    Family History  Problem Relation Age of Onset   Alzheimer's disease Father    Lung cancer Sister    Cancer Sister      Current Outpatient Medications:    aspirin EC 81 MG tablet, Take 81 mg by mouth daily., Disp: , Rfl:    cyanocobalamin 500 MCG tablet, Take 500 mcg by mouth daily., Disp: , Rfl:    ibuprofen (ADVIL,MOTRIN) 200 MG tablet, Take 200 mg by mouth every 6 (six) hours as needed. , Disp: , Rfl:    pantoprazole (PROTONIX) 40 MG tablet, Take 1 tablet (40 mg total) by mouth daily., Disp: 90 tablet, Rfl: 1   tiZANidine (ZANAFLEX) 4 MG tablet, Take 4 mg by mouth every 6 (six) hours as needed. , Disp: , Rfl:    valsartan-hydrochlorothiazide (DIOVAN-HCT) 320-25 MG tablet, Take 1 tablet by mouth daily., Disp: 90 tablet, Rfl: 2   allopurinol (ZYLOPRIM) 300 MG tablet, Take 1 tablet (300 mg total) by mouth daily., Disp: 30 tablet, Rfl: 3   colchicine 0.6 MG tablet, Take 2 tablets (1.52m) by mouth at first sign of gout flare followed by 1 tablet (0.626m after 1 hour. (Max 1.7m75mithin 1 hour), Disp: , Rfl:    ondansetron  (ZOFRAN) 8 MG tablet, Take 1 tablet (8 mg total) by mouth 2 (two) times daily as needed (Nausea or vomiting). (Patient not taking: Reported on 09/30/2018), Disp: 30 tablet, Rfl: 1   potassium chloride SA (K-DUR) 20 MEQ tablet, Take 1 tablet (20 mEq total) by mouth 2 (two) times daily., Disp: 10 tablet, Rfl: 0   prochlorperazine (COMPAZINE) 10 MG tablet, Take 1 tablet (10 mg total) by mouth every 6 (six) hours as needed (Nausea or vomiting). (Patient not taking: Reported on 09/30/2018), Disp: 30 tablet, Rfl: 1   valACYclovir (VALTREX) 500 MG tablet, Take 1 tablet (500 mg total) by mouth daily., Disp: 30 tablet, Rfl: 3  Physical exam:  Vitals:   09/30/18 1328  BP: 118/74  Pulse: 97  Resp: 16  Temp: 98.4 F (36.9 C)  TempSrc: Tympanic  Weight: (!) 306 lb 4.8 oz (  138.9 kg)  Height: 6' 2.5" (1.892 m)   Physical Exam Constitutional:      General: He is not in acute distress.    Appearance: He is obese.  HENT:     Head: Normocephalic and atraumatic.  Eyes:     Pupils: Pupils are equal, round, and reactive to light.  Neck:     Musculoskeletal: Normal range of motion.  Cardiovascular:     Rate and Rhythm: Normal rate and regular rhythm.     Heart sounds: Normal heart sounds.  Pulmonary:     Effort: Pulmonary effort is normal.     Breath sounds: Normal breath sounds.  Abdominal:     General: Bowel sounds are normal.     Palpations: Abdomen is soft.  Musculoskeletal:     Comments: Left hand has a brace in place  Skin:    General: Skin is warm and dry.  Neurological:     Mental Status: He is alert and oriented to person, place, and time.      CMP Latest Ref Rng & Units 09/30/2018  Glucose 70 - 99 mg/dL 100(H)  BUN 8 - 23 mg/dL 10  Creatinine 0.61 - 1.24 mg/dL 1.06  Sodium 135 - 145 mmol/L 141  Potassium 3.5 - 5.1 mmol/L 2.8(L)  Chloride 98 - 111 mmol/L 104  CO2 22 - 32 mmol/L 29  Calcium 8.9 - 10.3 mg/dL 8.9  Total Protein 6.5 - 8.1 g/dL -  Total Bilirubin 0.3 - 1.2 mg/dL -   Alkaline Phos 38 - 126 U/L -  AST 15 - 41 U/L -  ALT 0 - 44 U/L -   CBC Latest Ref Rng & Units 09/30/2018  WBC 4.0 - 10.5 K/uL 12.9(H)  Hemoglobin 13.0 - 17.0 g/dL 8.2(L)  Hematocrit 39.0 - 52.0 % 27.1(L)  Platelets 150 - 400 K/uL 365    No images are attached to the encounter.  Ct Bone Marrow Biopsy & Aspiration  Result Date: 09/04/2018 INDICATION: UNSPECIFIED LEUKOCYTOSIS, THROMBOCYTOSIS EXAM: CT GUIDED RIGHT ILIAC BONE MARROW ASPIRATION AND CORE BIOPSY Date:  09/04/2018 09/04/2018 10:25 am Radiologist:  M. Daryll Brod, MD Guidance:  CT FLUOROSCOPY TIME:  Fluoroscopy Time: None. MEDICATIONS: 1% lidocaine local ANESTHESIA/SEDATION: 3.5 mg IV Versed; 75 mcg IV Fentanyl Moderate Sedation Time:  14 minutes The patient was continuously monitored during the procedure by the interventional radiology nurse under my direct supervision. CONTRAST:  None. COMPLICATIONS: None PROCEDURE: Informed consent was obtained from the patient following explanation of the procedure, risks, benefits and alternatives. The patient understands, agrees and consents for the procedure. All questions were addressed. A time out was performed. The patient was positioned prone and non-contrast localization CT was performed of the pelvis to demonstrate the iliac marrow spaces. Maximal barrier sterile technique utilized including caps, mask, sterile gowns, sterile gloves, large sterile drape, hand hygiene, and Betadine prep. Under sterile conditions and local anesthesia, an 11 gauge coaxial bone biopsy needle was advanced into the right iliac marrow space. Needle position was confirmed with CT imaging. Initially, bone marrow aspiration was performed. Next, the 11 gauge outer cannula was utilized to obtain a right iliac bone marrow core biopsy. Needle was removed. Hemostasis was obtained with compression. The patient tolerated the procedure well. Samples were prepared with the cytotechnologist. No immediate complications. IMPRESSION: CT  guided right iliac bone marrow aspiration and core biopsy. Electronically Signed   By: Jerilynn Mages.  Shick M.D.   On: 09/04/2018 10:30     Assessment and plan- Patient is a  78 y.o. male with CMML 1 he is here for on treatment assessment prior to cycle 1 day 1 of Vidaza  Patient was seen for second opinion consultation as well as possible clinical trial discussion at [facility] by Dr. Dellis Filbert.  He is in agreement with proceeding with Vidaza day 1 to day 5 and day 8 today 9 every 28 days.  We will plan to get a repeat bone marrow biopsy after 4 cycles.  Again discussed risks and benefits of Vidaza including all but not limited to fatigue, nausea, vomiting, possible risk of infections and hospitalization.  I will check weekly CBC for the next 4 weeks.  I will also start him on Valtrex for shingles prophylaxis.  Counts okay to proceed with Vidaza today and for the next 7 days.  I will see him back in 4 weeks time for cycle 2-day 1 of Vidaza   Visit Diagnosis 1. Chronic myelomonocytic leukemia not having achieved remission ([place])   2. Encounter for antineoplastic chemotherapy      Dr. Randa Evens, MD, MPH [facility] at [facility] [phone] 10/01/2018 9:53 AM

## 2018-10-02 ENCOUNTER — Other Ambulatory Visit: Payer: Self-pay

## 2018-10-02 ENCOUNTER — Inpatient Hospital Stay: Payer: Medicare Other

## 2018-10-02 VITALS — BP 110/68 | HR 100 | Resp 20

## 2018-10-02 DIAGNOSIS — Z5111 Encounter for antineoplastic chemotherapy: Secondary | ICD-10-CM | POA: Diagnosis not present

## 2018-10-02 DIAGNOSIS — Z7189 Other specified counseling: Secondary | ICD-10-CM | POA: Diagnosis not present

## 2018-10-02 DIAGNOSIS — I10 Essential (primary) hypertension: Secondary | ICD-10-CM | POA: Diagnosis not present

## 2018-10-02 DIAGNOSIS — C9310 Chronic myelomonocytic leukemia not having achieved remission: Secondary | ICD-10-CM

## 2018-10-02 MED ORDER — ONDANSETRON HCL 4 MG PO TABS
8.0000 mg | ORAL_TABLET | Freq: Once | ORAL | Status: AC
  Administered 2018-10-02: 8 mg via ORAL
  Filled 2018-10-02: qty 2

## 2018-10-02 MED ORDER — AZACITIDINE CHEMO SQ INJECTION
75.0000 mg/m2 | Freq: Once | INTRAMUSCULAR | Status: AC
  Administered 2018-10-02: 200 mg via SUBCUTANEOUS
  Filled 2018-10-02: qty 8

## 2018-10-03 ENCOUNTER — Other Ambulatory Visit: Payer: Self-pay

## 2018-10-03 ENCOUNTER — Inpatient Hospital Stay: Payer: Medicare Other

## 2018-10-03 VITALS — BP 115/71 | HR 94 | Temp 98.4°F | Resp 18

## 2018-10-03 DIAGNOSIS — Z7189 Other specified counseling: Secondary | ICD-10-CM | POA: Diagnosis not present

## 2018-10-03 DIAGNOSIS — C9310 Chronic myelomonocytic leukemia not having achieved remission: Secondary | ICD-10-CM

## 2018-10-03 DIAGNOSIS — Z5111 Encounter for antineoplastic chemotherapy: Secondary | ICD-10-CM | POA: Diagnosis not present

## 2018-10-03 DIAGNOSIS — I10 Essential (primary) hypertension: Secondary | ICD-10-CM | POA: Diagnosis not present

## 2018-10-03 MED ORDER — AZACITIDINE CHEMO SQ INJECTION
75.0000 mg/m2 | Freq: Once | INTRAMUSCULAR | Status: AC
  Administered 2018-10-03: 200 mg via SUBCUTANEOUS
  Filled 2018-10-03: qty 8

## 2018-10-03 MED ORDER — ONDANSETRON HCL 4 MG PO TABS
8.0000 mg | ORAL_TABLET | Freq: Once | ORAL | Status: AC
  Administered 2018-10-03: 8 mg via ORAL
  Filled 2018-10-03: qty 2

## 2018-10-04 ENCOUNTER — Inpatient Hospital Stay: Payer: Medicare Other

## 2018-10-04 ENCOUNTER — Other Ambulatory Visit: Payer: Self-pay | Admitting: *Deleted

## 2018-10-04 ENCOUNTER — Encounter: Payer: Self-pay | Admitting: *Deleted

## 2018-10-04 ENCOUNTER — Other Ambulatory Visit: Payer: Self-pay

## 2018-10-04 VITALS — BP 131/73 | HR 94 | Temp 98.1°F | Resp 18

## 2018-10-04 DIAGNOSIS — C9310 Chronic myelomonocytic leukemia not having achieved remission: Secondary | ICD-10-CM

## 2018-10-04 DIAGNOSIS — E876 Hypokalemia: Secondary | ICD-10-CM

## 2018-10-04 DIAGNOSIS — Z7189 Other specified counseling: Secondary | ICD-10-CM | POA: Diagnosis not present

## 2018-10-04 DIAGNOSIS — Z5111 Encounter for antineoplastic chemotherapy: Secondary | ICD-10-CM | POA: Diagnosis not present

## 2018-10-04 DIAGNOSIS — I10 Essential (primary) hypertension: Secondary | ICD-10-CM | POA: Diagnosis not present

## 2018-10-04 LAB — CBC WITH DIFFERENTIAL/PLATELET
Abs Immature Granulocytes: 0.11 10*3/uL — ABNORMAL HIGH (ref 0.00–0.07)
Basophils Absolute: 0.1 10*3/uL (ref 0.0–0.1)
Basophils Relative: 1 %
Eosinophils Absolute: 0.2 10*3/uL (ref 0.0–0.5)
Eosinophils Relative: 2 %
HCT: 26.3 % — ABNORMAL LOW (ref 39.0–52.0)
Hemoglobin: 7.7 g/dL — ABNORMAL LOW (ref 13.0–17.0)
Immature Granulocytes: 1 %
Lymphocytes Relative: 16 %
Lymphs Abs: 1.7 10*3/uL (ref 0.7–4.0)
MCH: 25.9 pg — ABNORMAL LOW (ref 26.0–34.0)
MCHC: 29.3 g/dL — ABNORMAL LOW (ref 30.0–36.0)
MCV: 88.6 fL (ref 80.0–100.0)
Monocytes Absolute: 2.5 10*3/uL — ABNORMAL HIGH (ref 0.1–1.0)
Monocytes Relative: 24 %
Neutro Abs: 5.8 10*3/uL (ref 1.7–7.7)
Neutrophils Relative %: 56 %
Platelets: 271 10*3/uL (ref 150–400)
RBC: 2.97 MIL/uL — ABNORMAL LOW (ref 4.22–5.81)
RDW: 21.3 % — ABNORMAL HIGH (ref 11.5–15.5)
WBC: 10.4 10*3/uL (ref 4.0–10.5)
nRBC: 0.3 % — ABNORMAL HIGH (ref 0.0–0.2)

## 2018-10-04 LAB — BASIC METABOLIC PANEL
Anion gap: 9 (ref 5–15)
BUN: 6 mg/dL — ABNORMAL LOW (ref 8–23)
CO2: 29 mmol/L (ref 22–32)
Calcium: 8.7 mg/dL — ABNORMAL LOW (ref 8.9–10.3)
Chloride: 101 mmol/L (ref 98–111)
Creatinine, Ser: 0.78 mg/dL (ref 0.61–1.24)
GFR calc Af Amer: >60 mL/min (ref >60)
GFR calc non Af Amer: >60 mL/min (ref >60)
Glucose, Bld: 129 mg/dL — ABNORMAL HIGH (ref 70–99)
Potassium: 3.2 mmol/L — ABNORMAL LOW (ref 3.5–5.1)
Sodium: 139 mmol/L (ref 135–145)

## 2018-10-04 MED ORDER — AZACITIDINE CHEMO SQ INJECTION
75.0000 mg/m2 | Freq: Once | INTRAMUSCULAR | Status: AC
  Administered 2018-10-04: 200 mg via SUBCUTANEOUS
  Filled 2018-10-04: qty 8

## 2018-10-04 MED ORDER — POTASSIUM CHLORIDE CRYS ER 20 MEQ PO TBCR: 20.0000 meq | EXTENDED_RELEASE_TABLET | Freq: Two times a day (BID) | ORAL | 0 refills | Status: DC

## 2018-10-04 MED ORDER — ONDANSETRON HCL 4 MG PO TABS
8.0000 mg | ORAL_TABLET | Freq: Once | ORAL | Status: AC
  Administered 2018-10-04: 8 mg via ORAL
  Filled 2018-10-04: qty 2

## 2018-10-04 NOTE — Progress Notes (Signed)
Per 7/27/202 MD office note, okay to proceed with treatment with 09/30/2018 labs.   10/04/2018 labs resulted and MD made aware.

## 2018-10-07 ENCOUNTER — Inpatient Hospital Stay: Payer: Medicare Other

## 2018-10-07 ENCOUNTER — Other Ambulatory Visit: Payer: Self-pay

## 2018-10-07 ENCOUNTER — Inpatient Hospital Stay: Payer: Medicare Other | Attending: Oncology

## 2018-10-07 VITALS — BP 115/71 | HR 97 | Temp 97.9°F | Resp 18 | Wt 302.4 lb

## 2018-10-07 DIAGNOSIS — C9310 Chronic myelomonocytic leukemia not having achieved remission: Secondary | ICD-10-CM

## 2018-10-07 DIAGNOSIS — Z5111 Encounter for antineoplastic chemotherapy: Secondary | ICD-10-CM | POA: Insufficient documentation

## 2018-10-07 LAB — CBC WITH DIFFERENTIAL/PLATELET
Abs Immature Granulocytes: 0.07 10*3/uL (ref 0.00–0.07)
Basophils Absolute: 0.1 10*3/uL (ref 0.0–0.1)
Basophils Relative: 1 %
Eosinophils Absolute: 0.3 10*3/uL (ref 0.0–0.5)
Eosinophils Relative: 3 %
HCT: 27.5 % — ABNORMAL LOW (ref 39.0–52.0)
Hemoglobin: 8.2 g/dL — ABNORMAL LOW (ref 13.0–17.0)
Immature Granulocytes: 1 %
Lymphocytes Relative: 20 %
Lymphs Abs: 2 10*3/uL (ref 0.7–4.0)
MCH: 26 pg (ref 26.0–34.0)
MCHC: 29.8 g/dL — ABNORMAL LOW (ref 30.0–36.0)
MCV: 87.3 fL (ref 80.0–100.0)
Monocytes Absolute: 1.3 10*3/uL — ABNORMAL HIGH (ref 0.1–1.0)
Monocytes Relative: 13 %
Neutro Abs: 6 10*3/uL (ref 1.7–7.7)
Neutrophils Relative %: 62 %
Platelets: 253 10*3/uL (ref 150–400)
RBC: 3.15 MIL/uL — ABNORMAL LOW (ref 4.22–5.81)
RDW: 21.3 % — ABNORMAL HIGH (ref 11.5–15.5)
WBC: 9.6 10*3/uL (ref 4.0–10.5)
nRBC: 0.4 % — ABNORMAL HIGH (ref 0.0–0.2)

## 2018-10-07 LAB — BASIC METABOLIC PANEL
Anion gap: 9 (ref 5–15)
BUN: 10 mg/dL (ref 8–23)
CO2: 27 mmol/L (ref 22–32)
Calcium: 9.1 mg/dL (ref 8.9–10.3)
Chloride: 102 mmol/L (ref 98–111)
Creatinine, Ser: 0.91 mg/dL (ref 0.61–1.24)
GFR calc Af Amer: >60 mL/min (ref >60)
GFR calc non Af Amer: >60 mL/min (ref >60)
Glucose, Bld: 110 mg/dL — ABNORMAL HIGH (ref 70–99)
Potassium: 3.6 mmol/L (ref 3.5–5.1)
Sodium: 138 mmol/L (ref 135–145)

## 2018-10-07 MED ORDER — AZACITIDINE CHEMO SQ INJECTION
75.0000 mg/m2 | Freq: Once | INTRAMUSCULAR | Status: AC
  Administered 2018-10-07: 200 mg via SUBCUTANEOUS
  Filled 2018-10-07: qty 8

## 2018-10-07 MED ORDER — ONDANSETRON HCL 4 MG PO TABS
8.0000 mg | ORAL_TABLET | Freq: Once | ORAL | Status: AC
  Administered 2018-10-07: 14:00:00 8 mg via ORAL
  Filled 2018-10-07: qty 2

## 2018-10-08 ENCOUNTER — Other Ambulatory Visit: Payer: Medicare Other

## 2018-10-08 ENCOUNTER — Inpatient Hospital Stay: Payer: Medicare Other

## 2018-10-08 ENCOUNTER — Other Ambulatory Visit: Payer: Self-pay

## 2018-10-08 VITALS — BP 136/73 | HR 90 | Temp 97.1°F | Resp 18

## 2018-10-08 DIAGNOSIS — C9310 Chronic myelomonocytic leukemia not having achieved remission: Secondary | ICD-10-CM | POA: Diagnosis not present

## 2018-10-08 DIAGNOSIS — Z5111 Encounter for antineoplastic chemotherapy: Secondary | ICD-10-CM | POA: Diagnosis not present

## 2018-10-08 MED ORDER — AZACITIDINE CHEMO SQ INJECTION
75.0000 mg/m2 | Freq: Once | INTRAMUSCULAR | Status: AC
  Administered 2018-10-08: 200 mg via SUBCUTANEOUS
  Filled 2018-10-08: qty 8

## 2018-10-08 MED ORDER — ONDANSETRON HCL 4 MG PO TABS
8.0000 mg | ORAL_TABLET | Freq: Once | ORAL | Status: AC
  Administered 2018-10-08: 8 mg via ORAL
  Filled 2018-10-08: qty 2

## 2018-10-11 ENCOUNTER — Other Ambulatory Visit: Payer: Self-pay

## 2018-10-14 ENCOUNTER — Other Ambulatory Visit: Payer: Self-pay

## 2018-10-14 ENCOUNTER — Inpatient Hospital Stay: Payer: Medicare Other

## 2018-10-14 DIAGNOSIS — C9310 Chronic myelomonocytic leukemia not having achieved remission: Secondary | ICD-10-CM

## 2018-10-14 DIAGNOSIS — Z5111 Encounter for antineoplastic chemotherapy: Secondary | ICD-10-CM | POA: Diagnosis not present

## 2018-10-14 LAB — CBC WITH DIFFERENTIAL/PLATELET
Abs Immature Granulocytes: 1.53 10*3/uL — ABNORMAL HIGH (ref 0.00–0.07)
Basophils Absolute: 0.2 10*3/uL — ABNORMAL HIGH (ref 0.0–0.1)
Basophils Relative: 1 %
Eosinophils Absolute: 0.5 10*3/uL (ref 0.0–0.5)
Eosinophils Relative: 3 %
HCT: 28.5 % — ABNORMAL LOW (ref 39.0–52.0)
Hemoglobin: 8.4 g/dL — ABNORMAL LOW (ref 13.0–17.0)
Immature Granulocytes: 9 %
Lymphocytes Relative: 14 %
Lymphs Abs: 2.5 10*3/uL (ref 0.7–4.0)
MCH: 25.8 pg — ABNORMAL LOW (ref 26.0–34.0)
MCHC: 29.5 g/dL — ABNORMAL LOW (ref 30.0–36.0)
MCV: 87.4 fL (ref 80.0–100.0)
Monocytes Absolute: 2.1 10*3/uL — ABNORMAL HIGH (ref 0.1–1.0)
Monocytes Relative: 12 %
Neutro Abs: 11.3 10*3/uL — ABNORMAL HIGH (ref 1.7–7.7)
Neutrophils Relative %: 61 %
Platelets: 293 10*3/uL (ref 150–400)
RBC: 3.26 MIL/uL — ABNORMAL LOW (ref 4.22–5.81)
RDW: 21.6 % — ABNORMAL HIGH (ref 11.5–15.5)
Smear Review: ADEQUATE
WBC: 18 10*3/uL — ABNORMAL HIGH (ref 4.0–10.5)
nRBC: 0.5 % — ABNORMAL HIGH (ref 0.0–0.2)

## 2018-10-14 LAB — BASIC METABOLIC PANEL
Anion gap: 7 (ref 5–15)
BUN: 11 mg/dL (ref 8–23)
CO2: 27 mmol/L (ref 22–32)
Calcium: 9 mg/dL (ref 8.9–10.3)
Chloride: 103 mmol/L (ref 98–111)
Creatinine, Ser: 0.81 mg/dL (ref 0.61–1.24)
GFR calc Af Amer: >60 mL/min (ref >60)
GFR calc non Af Amer: >60 mL/min (ref >60)
Glucose, Bld: 110 mg/dL — ABNORMAL HIGH (ref 70–99)
Potassium: 4.1 mmol/L (ref 3.5–5.1)
Sodium: 137 mmol/L (ref 135–145)

## 2018-10-15 ENCOUNTER — Other Ambulatory Visit: Payer: Medicare Other

## 2018-10-18 ENCOUNTER — Other Ambulatory Visit: Payer: Self-pay

## 2018-10-21 ENCOUNTER — Other Ambulatory Visit: Payer: Self-pay

## 2018-10-21 ENCOUNTER — Inpatient Hospital Stay: Payer: Medicare Other

## 2018-10-21 DIAGNOSIS — Z5111 Encounter for antineoplastic chemotherapy: Secondary | ICD-10-CM | POA: Diagnosis not present

## 2018-10-21 DIAGNOSIS — C9310 Chronic myelomonocytic leukemia not having achieved remission: Secondary | ICD-10-CM

## 2018-10-21 LAB — CBC WITH DIFFERENTIAL/PLATELET
Abs Immature Granulocytes: 0.19 10*3/uL — ABNORMAL HIGH (ref 0.00–0.07)
Basophils Absolute: 0.1 10*3/uL (ref 0.0–0.1)
Basophils Relative: 1 %
Eosinophils Absolute: 0.2 10*3/uL (ref 0.0–0.5)
Eosinophils Relative: 2 %
HCT: 29.6 % — ABNORMAL LOW (ref 39.0–52.0)
Hemoglobin: 8.8 g/dL — ABNORMAL LOW (ref 13.0–17.0)
Immature Granulocytes: 2 %
Lymphocytes Relative: 17 %
Lymphs Abs: 2.1 10*3/uL (ref 0.7–4.0)
MCH: 25.6 pg — ABNORMAL LOW (ref 26.0–34.0)
MCHC: 29.7 g/dL — ABNORMAL LOW (ref 30.0–36.0)
MCV: 86 fL (ref 80.0–100.0)
Monocytes Absolute: 2.1 10*3/uL — ABNORMAL HIGH (ref 0.1–1.0)
Monocytes Relative: 17 %
Neutro Abs: 7.7 10*3/uL (ref 1.7–7.7)
Neutrophils Relative %: 61 %
Platelets: 573 10*3/uL — ABNORMAL HIGH (ref 150–400)
RBC: 3.44 MIL/uL — ABNORMAL LOW (ref 4.22–5.81)
RDW: 21.1 % — ABNORMAL HIGH (ref 11.5–15.5)
Smear Review: INCREASED
WBC: 12.4 10*3/uL — ABNORMAL HIGH (ref 4.0–10.5)
nRBC: 0.2 % (ref 0.0–0.2)

## 2018-10-21 LAB — BASIC METABOLIC PANEL
Anion gap: 7 (ref 5–15)
BUN: 15 mg/dL (ref 8–23)
CO2: 26 mmol/L (ref 22–32)
Calcium: 9.2 mg/dL (ref 8.9–10.3)
Chloride: 106 mmol/L (ref 98–111)
Creatinine, Ser: 0.96 mg/dL (ref 0.61–1.24)
GFR calc Af Amer: >60 mL/min (ref >60)
GFR calc non Af Amer: >60 mL/min (ref >60)
Glucose, Bld: 104 mg/dL — ABNORMAL HIGH (ref 70–99)
Potassium: 4.2 mmol/L (ref 3.5–5.1)
Sodium: 139 mmol/L (ref 135–145)

## 2018-10-22 ENCOUNTER — Ambulatory Visit (INDEPENDENT_AMBULATORY_CARE_PROVIDER_SITE_OTHER): Payer: Medicare Other | Admitting: Nurse Practitioner

## 2018-10-22 ENCOUNTER — Encounter: Payer: Self-pay | Admitting: Nurse Practitioner

## 2018-10-22 ENCOUNTER — Other Ambulatory Visit: Payer: Medicare Other

## 2018-10-22 VITALS — BP 129/70 | HR 86 | Resp 16 | Ht 74.0 in | Wt 311.6 lb

## 2018-10-22 DIAGNOSIS — M25532 Pain in left wrist: Secondary | ICD-10-CM

## 2018-10-22 DIAGNOSIS — C9310 Chronic myelomonocytic leukemia not having achieved remission: Secondary | ICD-10-CM | POA: Diagnosis not present

## 2018-10-22 DIAGNOSIS — D72829 Elevated white blood cell count, unspecified: Secondary | ICD-10-CM

## 2018-10-22 DIAGNOSIS — I10 Essential (primary) hypertension: Secondary | ICD-10-CM | POA: Diagnosis not present

## 2018-10-22 NOTE — Progress Notes (Signed)
[facility] [place], [place] [address]  Internal MEDICINE  Office Visit Note  Patient Name: Samuel Frank  [id number]  [id number]  Date of Service: 10/22/2018  Chief Complaint  Patient presents with  . Follow-up    pt diagnosed with chronic myeloma luekemia, under care at cancer center  . Hand Problem    referred to ortho or needs referral    The patient is here for routine follow up visit. Continues to have tenderness in left wrist. Did see orthopedics. Was put in a wrist splint. No surgery or other procedures done. Still tender. He has been diagnosed with chronic myelocytic leukemia aince his last visit he is currently undergoing chemotherapy. He is seeing oncology at the cancer center regularly. Labs appear to be improving overall. He states that he feels good. No problems. Has tolerated his chemotherapy well. Has not had to use any of the PRN medications. States he "feels just like he always does."       Current Medication: Outpatient Encounter Medications as of 10/22/2018  Medication Sig  . allopurinol (ZYLOPRIM) 300 MG tablet Take 1 tablet (300 mg total) by mouth daily.  [name] aspirin EC 81 MG tablet Take 81 mg by mouth daily.  . colchicine 0.6 MG tablet Take 2 tablets (1.2mg ) by mouth at first sign of gout flare followed by 1 tablet (0.6mg ) after 1 hour. (Max 1.8mg  within 1 hour)  . cyanocobalamin 500 MCG tablet Take 500 mcg by mouth daily.  [name] ibuprofen (ADVIL,MOTRIN) 200 MG tablet Take 200 mg by mouth every 6 (six) hours as needed.   . ondansetron (ZOFRAN) 8 MG tablet Take 1 tablet (8 mg total) by mouth 2 (two) times daily as needed (Nausea or vomiting). (Patient not taking: Reported on 09/30/2018)  . pantoprazole (PROTONIX) 40 MG tablet Take 1 tablet (40 mg total) by mouth daily.  . potassium chloride SA (K-DUR) 20 MEQ tablet Take 1 tablet (20 mEq total) by mouth 2 (two) times daily.  . prochlorperazine (COMPAZINE) 10 MG tablet Take 1 tablet (10 mg  total) by mouth every 6 (six) hours as needed (Nausea or vomiting). (Patient not taking: Reported on 09/30/2018)  . tiZANidine (ZANAFLEX) 4 MG tablet Take 4 mg by mouth every 6 (six) hours as needed.   . valACYclovir (VALTREX) 500 MG tablet Take 1 tablet (500 mg total) by mouth daily.  . valsartan-hydrochlorothiazide (DIOVAN-HCT) 320-25 MG tablet Take 1 tablet by mouth daily.   No facility-administered encounter medications on file as of 10/22/2018.     Surgical History: Past Surgical History:  Procedure Laterality Date  . COLONOSCOPY WITH PROPOFOL N/A 05/03/2015   Procedure: COLONOSCOPY WITH PROPOFOL;  Surgeon: [name], MD;  Location: [facility] ENDOSCOPY;  Service: Gastroenterology;  Laterality: N/A;  . ESOPHAGOGASTRODUODENOSCOPY (EGD) WITH PROPOFOL N/A 05/03/2015   Procedure: ESOPHAGOGASTRODUODENOSCOPY (EGD) WITH PROPOFOL;  Surgeon: [name], MD;  Location: [facility] ENDOSCOPY;  Service: Gastroenterology;  Laterality: N/A;  . HERNIA REPAIR     1975    Medical History: Past Medical History:  Diagnosis Date  . Anemia   . CMML (chronic myelomonocytic leukemia) ([place])   . Hypertension     Family History: Family History  Problem Relation Age of Onset  . Alzheimer's disease Father   . Lung cancer Sister   . Cancer Sister     Social History   Socioeconomic History  . Marital status: Divorced    Spouse name: Not on file  . Number of children:  4  . Years of education: Not on file  . Highest education level: Not on file  Occupational History  . Not on file  Social Needs  . Financial resource strain: Not hard at all  . Food insecurity    Worry: Never true    Inability: Never true  . Transportation needs    Medical: No    Non-medical: No  Tobacco Use  . Smoking status: Never Smoker  . Smokeless tobacco: Never Used  Substance and Sexual Activity  . Alcohol use: No  . Drug use: No  . Sexual activity: Not on file  Lifestyle  . Physical activity    Days per week: 3 days     Minutes per session: 150+ min  . Stress: Not at all  Relationships  . Social connections    Talks on phone: More than three times a week    Gets together: More than three times a week    Attends religious service: More than 4 times per year    Active member of club or organization: No    Attends meetings of clubs or organizations: Never    Relationship status: Divorced  . Intimate partner violence    Fear of current or ex partner: Not on file    Emotionally abused: Not on file    Physically abused: Not on file    Forced sexual activity: Not on file  Other Topics Concern  . Not on file  Social History Narrative  . Not on file      Review of Systems  Constitutional: Negative for activity change, chills, fatigue and unexpected weight change.  HENT: Negative for congestion, postnasal drip, rhinorrhea, sneezing and sore throat.   Respiratory: Negative for cough, chest tightness, shortness of breath and wheezing.   Cardiovascular: Negative for chest pain and palpitations.  Gastrointestinal: Negative for abdominal pain, constipation, diarrhea, nausea and vomiting.  Endocrine: Negative for cold intolerance, heat intolerance, polydipsia and polyuria.  Musculoskeletal: Positive for arthralgias. Negative for back pain, joint swelling and neck pain.       Left wrist and hand pain with swelling of the wrist joint.  Skin: Negative for rash.  Allergic/Immunologic: Negative for environmental allergies.  Neurological: Negative for dizziness, tremors, weakness, numbness and headaches.       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 Does not bruise/bleed easily.  Psychiatric/Behavioral: Negative for behavioral problems (Depression), sleep disturbance and suicidal ideas. The patient is not nervous/anxious.    Today's Vitals   10/22/18 1017  BP: 129/70  Pulse: 86  Resp: 16  SpO2: 99%  Weight: (!) 311 lb 9.6 oz (141.3 kg)  Height: 6'  2" (1.88 m)   Body mass index is 40.01 kg/m.  Physical Exam Vitals signs and nursing note reviewed.  Constitutional:      General: He is not in acute distress.    Appearance: Normal appearance. He is well-developed. He is not diaphoretic.  HENT:     Head: Normocephalic and atraumatic.     Mouth/Throat:     Pharynx: No oropharyngeal exudate.  Eyes:     Conjunctiva/sclera: Conjunctivae normal.     Pupils: Pupils are equal, round, and reactive to light.  Neck:     Musculoskeletal: Normal range of motion and neck supple.     Thyroid: No thyromegaly.     Vascular: No carotid bruit or JVD.     Trachea: No tracheal deviation.  Cardiovascular:  Rate and Rhythm: Normal rate and regular rhythm.     Heart sounds: Normal heart sounds. No murmur. No friction rub. No gallop.   Pulmonary:     Effort: Pulmonary effort is normal. No respiratory distress.     Breath sounds: Normal breath sounds. No wheezing or rales.  Chest:     Chest wall: No tenderness.  Abdominal:     General: Bowel sounds are normal.     Tenderness: There is no abdominal tenderness.  Musculoskeletal: Normal range of motion.     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 Left wrist is tender and currently in immobilizing splint   Lymphadenopathy:     Cervical: No cervical adenopathy.  Skin:    General: Skin is warm and dry.  Neurological:     Mental Status: He is alert and oriented to person, place, and time. Mental status is at baseline.     Cranial Nerves: No cranial nerve deficit.  Psychiatric:        Behavior: Behavior normal.        Thought Content: Thought content normal.        Judgment: Judgment normal.   Assessment/Plan: 1. 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 Undergoing chemotherapy treatments.   2. Leukocytosis, unspecified type Overall, there is improvement in his blood count. Continues to be  monitored closely per oncology.   3. Left wrist pain Orthopedics follow up as indicated.   4. Essential hypertension, benign Stable. Continue bp medication as prescribed   General Counseling: kyon bentler understanding of the findings of todays visit and agrees with plan of treatment. I have discussed any further diagnostic evaluation that may be needed or ordered today. We also reviewed his medications today. he has been encouraged to call the office with any questions or concerns that should arise related to todays visit.  This patient was seen by Leretha Pol FNP Collaboration with Dr Lavera Guise as a part of collaborative care agreement  Time spent: 81 Minutes      Dr Lavera Guise Internal medicine

## 2018-10-27 ENCOUNTER — Other Ambulatory Visit: Payer: Self-pay | Admitting: Oncology

## 2018-10-28 ENCOUNTER — Inpatient Hospital Stay: Payer: Medicare Other

## 2018-10-28 ENCOUNTER — Inpatient Hospital Stay (HOSPITAL_BASED_OUTPATIENT_CLINIC_OR_DEPARTMENT_OTHER): Payer: Medicare Other | Admitting: Oncology

## 2018-10-28 ENCOUNTER — Other Ambulatory Visit: Payer: Self-pay

## 2018-10-28 ENCOUNTER — Encounter: Payer: Self-pay | Admitting: Oncology

## 2018-10-28 VITALS — Resp 18

## 2018-10-28 VITALS — BP 99/59 | HR 97 | Temp 96.6°F | Resp 24 | Ht 72.0 in | Wt 305.0 lb

## 2018-10-28 DIAGNOSIS — Z5111 Encounter for antineoplastic chemotherapy: Secondary | ICD-10-CM | POA: Diagnosis not present

## 2018-10-28 DIAGNOSIS — C9310 Chronic myelomonocytic leukemia not having achieved remission: Secondary | ICD-10-CM

## 2018-10-28 LAB — BASIC METABOLIC PANEL
Anion gap: 8 (ref 5–15)
BUN: 29 mg/dL — ABNORMAL HIGH (ref 8–23)
CO2: 22 mmol/L (ref 22–32)
Calcium: 8.9 mg/dL (ref 8.9–10.3)
Chloride: 107 mmol/L (ref 98–111)
Creatinine, Ser: 0.97 mg/dL (ref 0.61–1.24)
GFR calc Af Amer: >60 mL/min (ref >60)
GFR calc non Af Amer: >60 mL/min (ref >60)
Glucose, Bld: 123 mg/dL — ABNORMAL HIGH (ref 70–99)
Potassium: 4 mmol/L (ref 3.5–5.1)
Sodium: 137 mmol/L (ref 135–145)

## 2018-10-28 LAB — CBC WITH DIFFERENTIAL/PLATELET
Abs Immature Granulocytes: 0 10*3/uL (ref 0.00–0.07)
Basophils Absolute: 0.3 10*3/uL — ABNORMAL HIGH (ref 0.0–0.1)
Basophils Relative: 4 %
Eosinophils Absolute: 0 10*3/uL (ref 0.0–0.5)
Eosinophils Relative: 0 %
HCT: 30 % — ABNORMAL LOW (ref 39.0–52.0)
Hemoglobin: 8.9 g/dL — ABNORMAL LOW (ref 13.0–17.0)
Lymphocytes Relative: 21 %
Lymphs Abs: 1.4 10*3/uL (ref 0.7–4.0)
MCH: 24.9 pg — ABNORMAL LOW (ref 26.0–34.0)
MCHC: 29.7 g/dL — ABNORMAL LOW (ref 30.0–36.0)
MCV: 83.8 fL (ref 80.0–100.0)
Monocytes Absolute: 0.5 10*3/uL (ref 0.1–1.0)
Monocytes Relative: 7 %
Neutro Abs: 4.5 10*3/uL (ref 1.7–7.7)
Neutrophils Relative %: 68 %
Platelets: 727 10*3/uL — ABNORMAL HIGH (ref 150–400)
RBC: 3.58 MIL/uL — ABNORMAL LOW (ref 4.22–5.81)
RDW: 21.4 % — ABNORMAL HIGH (ref 11.5–15.5)
Smear Review: INCREASED
WBC: 6.6 10*3/uL (ref 4.0–10.5)
nRBC: 0 % (ref 0.0–0.2)

## 2018-10-28 MED ORDER — ONDANSETRON HCL 4 MG PO TABS
8.0000 mg | ORAL_TABLET | Freq: Once | ORAL | Status: AC
  Administered 2018-10-28: 10:00:00 8 mg via ORAL
  Filled 2018-10-28: qty 2

## 2018-10-28 MED ORDER — AZACITIDINE CHEMO SQ INJECTION
75.0000 mg/m2 | Freq: Once | INTRAMUSCULAR | Status: AC
  Administered 2018-10-28: 200 mg via SUBCUTANEOUS
  Filled 2018-10-28: qty 8

## 2018-10-28 NOTE — Progress Notes (Signed)
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 He has recently been having bilateral knee pain and has undergone steroid injection in his knee.Patient was noted to have a high white count on his routine exam. His white count was 19.2 on 07/22/2018 with an H&H of 11.4/35.1 and a platelet count of 470. At that point he was prescribed a course of antibiotics and a repeat blood work on 08/21/2018 showed white count of 42.3, H&H of 7.1/23 with an MCV of 84 and a platelet count of 791 and hence the patient was referred to hematology for further management.  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 In addition the core biopsy showed a small focus of fibrosis containing scattering of mast cells and eosinophils most suggestive of systemic mastocytosis.bone marrow blasts 7%.Flow cytometry showed increased number of monocytic cells representing 16% of all cells with expression for HLA-DR, CD11b, CD13, CD14, CD33, CD38, CD56 and CD56 and CD64 with [facility] CD 117 are CD34.normal cytogenetics, CBL, SRSF2, SH2B3 and TET2   Interval history-he is doing well overall.  Denies any complaints at this time.  Appetite is good and weight has remained stable.  ECOG PS- 1 Pain scale- 0 Opioid associated constipation- no  Review of systems- Review of Systems  Constitutional: Negative for chills, fever, malaise/fatigue and weight loss.  HENT:  Negative for congestion, ear discharge and nosebleeds.   Eyes: Negative for blurred vision.  Respiratory: Negative for cough, hemoptysis, sputum production, shortness of breath and wheezing.   Cardiovascular: Negative for chest pain, palpitations, orthopnea and claudication.  Gastrointestinal: Negative for abdominal pain, blood in stool, constipation, diarrhea, heartburn, melena, nausea and vomiting.  Genitourinary: Negative for dysuria, flank pain, frequency, hematuria and urgency.  Musculoskeletal: Negative for back pain, joint pain and myalgias.  Skin: Negative for rash.  Neurological: Negative for dizziness, tingling, focal weakness, seizures, weakness and headaches.  Endo/Heme/Allergies: Does not bruise/bleed easily.  Psychiatric/Behavioral: Negative for depression and suicidal ideas. The patient does not have insomnia.       No Known Allergies   Past Medical History:  Diagnosis Date  . Anemia   . CMML (chronic myelomonocytic leukemia) ([place])   . Hypertension      Past Surgical History:  Procedure Laterality Date  . COLONOSCOPY WITH PROPOFOL N/A 05/03/2015   Procedure: COLONOSCOPY WITH PROPOFOL;  Surgeon: [name], MD;  Location: [facility] ENDOSCOPY;  Service: Gastroenterology;  Laterality: N/A;  . ESOPHAGOGASTRODUODENOSCOPY (EGD) WITH PROPOFOL N/A 05/03/2015   Procedure: ESOPHAGOGASTRODUODENOSCOPY (EGD) WITH PROPOFOL;  Surgeon: [name], MD;  Location: [facility] ENDOSCOPY;  Service: Gastroenterology;  Laterality: N/A;  . HERNIA REPAIR     1975    Social History   Socioeconomic History  . Marital status: Divorced    Spouse name: Not on file  . Number of children: 4  . Years of education: Not on file  . Highest education level: Not on file  Occupational History  . Not on  file  Social Needs  . Financial resource strain: Not hard at all  . Food insecurity    Worry: Never true    Inability: Never true  . Transportation needs    Medical: No    Non-medical: No  Tobacco Use   . Smoking status: Never Smoker  . Smokeless tobacco: Never Used  Substance and Sexual Activity  . Alcohol use: No  . Drug use: No  . Sexual activity: Not on file  Lifestyle  . Physical activity    Days per week: 3 days    Minutes per session: 150+ min  . Stress: Not at all  Relationships  . Social connections    Talks on phone: More than three times a week    Gets together: More than three times a week    Attends religious service: More than 4 times per year    Active member of club or organization: No    Attends meetings of clubs or organizations: Never    Relationship status: Divorced  . Intimate partner violence    Fear of current or ex partner: Not on file    Emotionally abused: Not on file    Physically abused: Not on file    Forced sexual activity: Not on file  Other Topics Concern  . Not on file  Social History Narrative  . Not on file    Family History  Problem Relation Age of Onset  . Alzheimer's disease Father   . Lung cancer Sister   . Cancer Sister      Current Outpatient Medications:  .  allopurinol (ZYLOPRIM) 300 MG tablet, Take 1 tablet (300 mg total) by mouth daily., Disp: 30 tablet, Rfl: 3 .  aspirin EC 81 MG tablet, Take 81 mg by mouth daily., Disp: , Rfl:  .  colchicine 0.6 MG tablet, Take 2 tablets (1.46m) by mouth at first sign of gout flare followed by 1 tablet (0.646m after 1 hour. (Max 1.28m85mithin 1 hour), Disp: , Rfl:  .  cyanocobalamin 500 MCG tablet, Take 500 mcg by mouth daily., Disp: , Rfl:  .  ibuprofen (ADVIL,MOTRIN) 200 MG tablet, Take 200 mg by mouth every 6 (six) hours as needed. , Disp: , Rfl:  .  KLOR-CON M20 20 MEQ tablet, TAKE 1 TABLET BY MOUTH TWICE A DAY, Disp: 60 tablet, Rfl: 0 .  ondansetron (ZOFRAN) 8 MG tablet, Take 1 tablet (8 mg total) by mouth 2 (two) times daily as needed (Nausea or vomiting). (Patient not taking: Reported on 09/30/2018), Disp: 30 tablet, Rfl: 1 .  pantoprazole (PROTONIX) 40 MG tablet, Take 1 tablet (40 mg  total) by mouth daily., Disp: 90 tablet, Rfl: 1 .  prochlorperazine (COMPAZINE) 10 MG tablet, Take 1 tablet (10 mg total) by mouth every 6 (six) hours as needed (Nausea or vomiting). (Patient not taking: Reported on 09/30/2018), Disp: 30 tablet, Rfl: 1 .  tiZANidine (ZANAFLEX) 4 MG tablet, Take 4 mg by mouth every 6 (six) hours as needed. , Disp: , Rfl:  .  valACYclovir (VALTREX) 500 MG tablet, Take 1 tablet (500 mg total) by mouth daily., Disp: 30 tablet, Rfl: 3 .  valsartan-hydrochlorothiazide (DIOVAN-HCT) 320-25 MG tablet, Take 1 tablet by mouth daily., Disp: 90 tablet, Rfl: 2  Physical exam:  Vitals:   10/28/18 0904  BP: (!) 99/59  Pulse: 97  Resp: (!) 24  Temp: (!) 96.6 F (35.9 C)  TempSrc: Tympanic  Weight: (!) 305 lb (138.3 kg)  Height: 6' (1.829 m)  Physical Exam Constitutional:      Appearance: He is obese.  HENT:     Head: Normocephalic and atraumatic.  Eyes:     Pupils: Pupils are equal, round, and reactive to light.  Neck:     Musculoskeletal: Normal range of motion.  Cardiovascular:     Rate and Rhythm: Normal rate and regular rhythm.     Heart sounds: Normal heart sounds.  Pulmonary:     Effort: Pulmonary effort is normal.     Breath sounds: Normal breath sounds.  Abdominal:     General: Bowel sounds are normal.     Palpations: Abdomen is soft.  Musculoskeletal:     Comments: Brace in place over left hand  Skin:    General: Skin is warm and dry.  Neurological:     Mental Status: He is alert and oriented to person, place, and time.      CMP Latest Ref Rng & Units 10/21/2018  Glucose 70 - 99 mg/dL 104(H)  BUN 8 - 23 mg/dL 15  Creatinine 0.61 - 1.24 mg/dL 0.96  Sodium 135 - 145 mmol/L 139  Potassium 3.5 - 5.1 mmol/L 4.2  Chloride 98 - 111 mmol/L 106  CO2 22 - 32 mmol/L 26  Calcium 8.9 - 10.3 mg/dL 9.2  Total Protein 6.5 - 8.1 g/dL -  Total Bilirubin 0.3 - 1.2 mg/dL -  Alkaline Phos 38 - 126 U/L -  AST 15 - 41 U/L -  ALT 0 - 44 U/L -   CBC Latest  Ref Rng & Units 10/21/2018  WBC 4.0 - 10.5 K/uL 12.4(H)  Hemoglobin 13.0 - 17.0 g/dL 8.8(L)  Hematocrit 39.0 - 52.0 % 29.6(L)  Platelets 150 - 400 K/uL 573(H)     Assessment and plan- Patient is a 78 y.o. male with CMML 1.  He is here for on treatment assessment prior to cycle 2-day 1 of Vidaza  Counts okay to proceed with cycle 2-day 1 of Vidaza today.  He will continue to get Vidaza day 1 to day 5 and then day 8 and day 9.  Repeat CBC with differential in 2 weeks time.  I will see him back in 4 weeks time with CBC with differential, CMP prior to cycle 3-day 1 of Vidaza.  Patient will continue Valtrex for viral prophylaxis.   Visit Diagnosis 1. 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 Encounter for antineoplastic chemotherapy      Dr. Randa Evens, MD, MPH [facility] at [facility] [phone] 10/28/2018 9:14 AM

## 2018-10-28 NOTE — Progress Notes (Signed)
BP 99/59

## 2018-10-29 ENCOUNTER — Other Ambulatory Visit: Payer: Self-pay

## 2018-10-29 ENCOUNTER — Inpatient Hospital Stay: Payer: Medicare Other

## 2018-10-29 VITALS — BP 107/53 | HR 98 | Temp 98.9°F | Resp 18

## 2018-10-29 DIAGNOSIS — C9310 Chronic myelomonocytic leukemia not having achieved remission: Secondary | ICD-10-CM

## 2018-10-29 DIAGNOSIS — Z5111 Encounter for antineoplastic chemotherapy: Secondary | ICD-10-CM | POA: Diagnosis not present

## 2018-10-29 MED ORDER — ONDANSETRON HCL 4 MG PO TABS
8.0000 mg | ORAL_TABLET | Freq: Once | ORAL | Status: AC
  Administered 2018-10-29: 14:00:00 8 mg via ORAL
  Filled 2018-10-29: qty 2

## 2018-10-29 MED ORDER — AZACITIDINE CHEMO SQ INJECTION
75.0000 mg/m2 | Freq: Once | INTRAMUSCULAR | Status: AC
  Administered 2018-10-29: 200 mg via SUBCUTANEOUS
  Filled 2018-10-29: qty 8

## 2018-10-30 ENCOUNTER — Inpatient Hospital Stay: Payer: Medicare Other

## 2018-10-30 ENCOUNTER — Other Ambulatory Visit: Payer: Self-pay

## 2018-10-30 VITALS — BP 92/56 | HR 105 | Temp 98.9°F | Resp 18

## 2018-10-30 DIAGNOSIS — Z5111 Encounter for antineoplastic chemotherapy: Secondary | ICD-10-CM | POA: Diagnosis not present

## 2018-10-30 DIAGNOSIS — C9310 Chronic myelomonocytic leukemia not having achieved remission: Secondary | ICD-10-CM | POA: Diagnosis not present

## 2018-10-30 MED ORDER — ONDANSETRON HCL 4 MG PO TABS
8.0000 mg | ORAL_TABLET | Freq: Once | ORAL | Status: AC
  Administered 2018-10-30: 13:00:00 8 mg via ORAL
  Filled 2018-10-30: qty 2

## 2018-10-30 MED ORDER — AZACITIDINE CHEMO SQ INJECTION
75.0000 mg/m2 | Freq: Once | INTRAMUSCULAR | Status: AC
  Administered 2018-10-30: 200 mg via SUBCUTANEOUS
  Filled 2018-10-30: qty 8

## 2018-10-31 ENCOUNTER — Other Ambulatory Visit: Payer: Self-pay

## 2018-10-31 ENCOUNTER — Inpatient Hospital Stay: Payer: Medicare Other

## 2018-10-31 VITALS — BP 102/64 | HR 109 | Temp 99.0°F | Resp 20

## 2018-10-31 DIAGNOSIS — C9310 Chronic myelomonocytic leukemia not having achieved remission: Secondary | ICD-10-CM

## 2018-10-31 DIAGNOSIS — Z5111 Encounter for antineoplastic chemotherapy: Secondary | ICD-10-CM | POA: Diagnosis not present

## 2018-10-31 MED ORDER — AZACITIDINE CHEMO SQ INJECTION
75.0000 mg/m2 | Freq: Once | INTRAMUSCULAR | Status: AC
  Administered 2018-10-31: 14:00:00 200 mg via SUBCUTANEOUS
  Filled 2018-10-31: qty 8

## 2018-10-31 MED ORDER — ONDANSETRON HCL 4 MG PO TABS
8.0000 mg | ORAL_TABLET | Freq: Once | ORAL | Status: AC
  Administered 2018-10-31: 8 mg via ORAL
  Filled 2018-10-31: qty 2

## 2018-11-01 ENCOUNTER — Other Ambulatory Visit: Payer: Self-pay

## 2018-11-01 ENCOUNTER — Inpatient Hospital Stay: Payer: Medicare Other

## 2018-11-01 VITALS — BP 106/63 | HR 106 | Temp 96.9°F | Resp 20

## 2018-11-01 DIAGNOSIS — Z5111 Encounter for antineoplastic chemotherapy: Secondary | ICD-10-CM | POA: Diagnosis not present

## 2018-11-01 DIAGNOSIS — C9310 Chronic myelomonocytic leukemia not having achieved remission: Secondary | ICD-10-CM | POA: Diagnosis not present

## 2018-11-01 MED ORDER — AZACITIDINE CHEMO SQ INJECTION
75.0000 mg/m2 | Freq: Once | INTRAMUSCULAR | Status: AC
  Administered 2018-11-01: 200 mg via SUBCUTANEOUS
  Filled 2018-11-01: qty 8

## 2018-11-01 MED ORDER — ONDANSETRON HCL 4 MG PO TABS
8.0000 mg | ORAL_TABLET | Freq: Once | ORAL | Status: AC
  Administered 2018-11-01: 8 mg via ORAL
  Filled 2018-11-01: qty 2

## 2018-11-04 ENCOUNTER — Other Ambulatory Visit: Payer: Self-pay

## 2018-11-04 ENCOUNTER — Inpatient Hospital Stay: Payer: Medicare Other

## 2018-11-04 VITALS — BP 119/69 | HR 106 | Temp 98.8°F | Resp 20

## 2018-11-04 DIAGNOSIS — C9310 Chronic myelomonocytic leukemia not having achieved remission: Secondary | ICD-10-CM | POA: Diagnosis not present

## 2018-11-04 DIAGNOSIS — Z5111 Encounter for antineoplastic chemotherapy: Secondary | ICD-10-CM | POA: Diagnosis not present

## 2018-11-04 MED ORDER — ONDANSETRON HCL 4 MG PO TABS
8.0000 mg | ORAL_TABLET | Freq: Once | ORAL | Status: AC
  Administered 2018-11-04: 14:00:00 8 mg via ORAL
  Filled 2018-11-04: qty 2

## 2018-11-04 MED ORDER — AZACITIDINE CHEMO SQ INJECTION
75.0000 mg/m2 | Freq: Once | INTRAMUSCULAR | Status: AC
  Administered 2018-11-04: 200 mg via SUBCUTANEOUS
  Filled 2018-11-04: qty 8

## 2018-11-04 NOTE — Progress Notes (Signed)
Per Dr. Janese Banks, proceed with Vidaza today

## 2018-11-05 ENCOUNTER — Other Ambulatory Visit: Payer: Self-pay

## 2018-11-05 ENCOUNTER — Inpatient Hospital Stay: Payer: Medicare Other | Attending: Oncology

## 2018-11-05 VITALS — BP 96/58 | HR 97 | Temp 98.8°F | Resp 20

## 2018-11-05 DIAGNOSIS — E611 Iron deficiency: Secondary | ICD-10-CM | POA: Diagnosis not present

## 2018-11-05 DIAGNOSIS — C9310 Chronic myelomonocytic leukemia not having achieved remission: Secondary | ICD-10-CM

## 2018-11-05 DIAGNOSIS — Z5111 Encounter for antineoplastic chemotherapy: Secondary | ICD-10-CM | POA: Insufficient documentation

## 2018-11-05 MED ORDER — AZACITIDINE CHEMO SQ INJECTION
75.0000 mg/m2 | Freq: Once | INTRAMUSCULAR | Status: AC
  Administered 2018-11-05: 14:00:00 200 mg via SUBCUTANEOUS
  Filled 2018-11-05: qty 8

## 2018-11-05 MED ORDER — ONDANSETRON HCL 4 MG PO TABS
8.0000 mg | ORAL_TABLET | Freq: Once | ORAL | Status: AC
  Administered 2018-11-05: 8 mg via ORAL
  Filled 2018-11-05: qty 2

## 2018-11-08 ENCOUNTER — Other Ambulatory Visit: Payer: Self-pay

## 2018-11-12 ENCOUNTER — Inpatient Hospital Stay: Payer: Medicare Other

## 2018-11-12 ENCOUNTER — Other Ambulatory Visit: Payer: Self-pay

## 2018-11-12 DIAGNOSIS — C9310 Chronic myelomonocytic leukemia not having achieved remission: Secondary | ICD-10-CM

## 2018-11-12 DIAGNOSIS — Z5111 Encounter for antineoplastic chemotherapy: Secondary | ICD-10-CM

## 2018-11-12 DIAGNOSIS — E611 Iron deficiency: Secondary | ICD-10-CM | POA: Diagnosis not present

## 2018-11-12 LAB — CBC WITH DIFFERENTIAL/PLATELET
Abs Immature Granulocytes: 0.18 10*3/uL — ABNORMAL HIGH (ref 0.00–0.07)
Basophils Absolute: 0.1 10*3/uL (ref 0.0–0.1)
Basophils Relative: 1 %
Eosinophils Absolute: 0.5 10*3/uL (ref 0.0–0.5)
Eosinophils Relative: 6 %
HCT: 30.5 % — ABNORMAL LOW (ref 39.0–52.0)
Hemoglobin: 8.9 g/dL — ABNORMAL LOW (ref 13.0–17.0)
Immature Granulocytes: 2 %
Lymphocytes Relative: 24 %
Lymphs Abs: 2 10*3/uL (ref 0.7–4.0)
MCH: 24.6 pg — ABNORMAL LOW (ref 26.0–34.0)
MCHC: 29.2 g/dL — ABNORMAL LOW (ref 30.0–36.0)
MCV: 84.3 fL (ref 80.0–100.0)
Monocytes Absolute: 1.3 10*3/uL — ABNORMAL HIGH (ref 0.1–1.0)
Monocytes Relative: 15 %
Neutro Abs: 4.5 10*3/uL (ref 1.7–7.7)
Neutrophils Relative %: 52 %
Platelets: 150 10*3/uL (ref 150–400)
RBC: 3.62 MIL/uL — ABNORMAL LOW (ref 4.22–5.81)
RDW: 21.1 % — ABNORMAL HIGH (ref 11.5–15.5)
WBC: 8.6 10*3/uL (ref 4.0–10.5)
nRBC: 0.2 % (ref 0.0–0.2)

## 2018-11-12 LAB — BASIC METABOLIC PANEL
Anion gap: 7 (ref 5–15)
BUN: 19 mg/dL (ref 8–23)
CO2: 28 mmol/L (ref 22–32)
Calcium: 9.1 mg/dL (ref 8.9–10.3)
Chloride: 101 mmol/L (ref 98–111)
Creatinine, Ser: 1.01 mg/dL (ref 0.61–1.24)
GFR calc Af Amer: >60 mL/min (ref >60)
GFR calc non Af Amer: >60 mL/min (ref >60)
Glucose, Bld: 106 mg/dL — ABNORMAL HIGH (ref 70–99)
Potassium: 4.1 mmol/L (ref 3.5–5.1)
Sodium: 136 mmol/L (ref 135–145)

## 2018-11-23 ENCOUNTER — Other Ambulatory Visit: Payer: Self-pay | Admitting: Oncology

## 2018-11-25 ENCOUNTER — Inpatient Hospital Stay: Payer: Medicare Other

## 2018-11-25 ENCOUNTER — Other Ambulatory Visit: Payer: Self-pay

## 2018-11-25 ENCOUNTER — Telehealth: Payer: Self-pay | Admitting: *Deleted

## 2018-11-25 ENCOUNTER — Encounter: Payer: Self-pay | Admitting: Oncology

## 2018-11-25 ENCOUNTER — Inpatient Hospital Stay (HOSPITAL_BASED_OUTPATIENT_CLINIC_OR_DEPARTMENT_OTHER): Payer: Medicare Other | Admitting: Oncology

## 2018-11-25 VITALS — BP 118/66 | HR 84 | Temp 97.9°F | Resp 16 | Wt 315.6 lb

## 2018-11-25 DIAGNOSIS — D509 Iron deficiency anemia, unspecified: Secondary | ICD-10-CM

## 2018-11-25 DIAGNOSIS — Z5111 Encounter for antineoplastic chemotherapy: Secondary | ICD-10-CM | POA: Diagnosis not present

## 2018-11-25 DIAGNOSIS — C9310 Chronic myelomonocytic leukemia not having achieved remission: Secondary | ICD-10-CM

## 2018-11-25 DIAGNOSIS — D473 Essential (hemorrhagic) thrombocythemia: Secondary | ICD-10-CM | POA: Diagnosis not present

## 2018-11-25 DIAGNOSIS — E611 Iron deficiency: Secondary | ICD-10-CM | POA: Diagnosis not present

## 2018-11-25 DIAGNOSIS — D75839 Thrombocytosis, unspecified: Secondary | ICD-10-CM

## 2018-11-25 LAB — CBC WITH DIFFERENTIAL/PLATELET
Abs Immature Granulocytes: 0.04 10*3/uL (ref 0.00–0.07)
Basophils Absolute: 0.1 10*3/uL (ref 0.0–0.1)
Basophils Relative: 2 %
Eosinophils Absolute: 0.1 10*3/uL (ref 0.0–0.5)
Eosinophils Relative: 1 %
HCT: 29.5 % — ABNORMAL LOW (ref 39.0–52.0)
Hemoglobin: 8.7 g/dL — ABNORMAL LOW (ref 13.0–17.0)
Immature Granulocytes: 1 %
Lymphocytes Relative: 25 %
Lymphs Abs: 1.7 10*3/uL (ref 0.7–4.0)
MCH: 23.5 pg — ABNORMAL LOW (ref 26.0–34.0)
MCHC: 29.5 g/dL — ABNORMAL LOW (ref 30.0–36.0)
MCV: 79.7 fL — ABNORMAL LOW (ref 80.0–100.0)
Monocytes Absolute: 0.2 10*3/uL (ref 0.1–1.0)
Monocytes Relative: 3 %
Neutro Abs: 4.6 10*3/uL (ref 1.7–7.7)
Neutrophils Relative %: 68 %
Platelets: 1366 10*3/uL (ref 150–400)
RBC: 3.7 MIL/uL — ABNORMAL LOW (ref 4.22–5.81)
RDW: 22.9 % — ABNORMAL HIGH (ref 11.5–15.5)
Smear Review: INCREASED
WBC: 6.7 10*3/uL (ref 4.0–10.5)
nRBC: 0 % (ref 0.0–0.2)

## 2018-11-25 LAB — COMPREHENSIVE METABOLIC PANEL
ALT: 11 U/L (ref 0–44)
AST: 12 U/L — ABNORMAL LOW (ref 15–41)
Albumin: 4 g/dL (ref 3.5–5.0)
Alkaline Phosphatase: 84 U/L (ref 38–126)
Anion gap: 8 (ref 5–15)
BUN: 17 mg/dL (ref 8–23)
CO2: 26 mmol/L (ref 22–32)
Calcium: 9 mg/dL (ref 8.9–10.3)
Chloride: 102 mmol/L (ref 98–111)
Creatinine, Ser: 1.02 mg/dL (ref 0.61–1.24)
GFR calc Af Amer: >60 mL/min (ref >60)
GFR calc non Af Amer: >60 mL/min (ref >60)
Glucose, Bld: 105 mg/dL — ABNORMAL HIGH (ref 70–99)
Potassium: 4.1 mmol/L (ref 3.5–5.1)
Sodium: 136 mmol/L (ref 135–145)
Total Bilirubin: 0.5 mg/dL (ref 0.3–1.2)
Total Protein: 7.9 g/dL (ref 6.5–8.1)

## 2018-11-25 LAB — IRON AND TIBC
Iron: 23 ug/dL — ABNORMAL LOW (ref 45–182)
Saturation Ratios: 6 % — ABNORMAL LOW (ref 17.9–39.5)
TIBC: 400 ug/dL (ref 250–450)
UIBC: 377 ug/dL

## 2018-11-25 LAB — LACTATE DEHYDROGENASE: LDH: 151 U/L (ref 98–192)

## 2018-11-25 LAB — FOLATE: Folate: 16.1 ng/mL (ref >5.9)

## 2018-11-25 MED ORDER — ONDANSETRON HCL 4 MG PO TABS
8.0000 mg | ORAL_TABLET | Freq: Once | ORAL | Status: AC
  Administered 2018-11-25: 11:00:00 8 mg via ORAL
  Filled 2018-11-25: qty 2

## 2018-11-25 MED ORDER — AZACITIDINE CHEMO SQ INJECTION
75.0000 mg/m2 | Freq: Once | INTRAMUSCULAR | Status: AC
  Administered 2018-11-25: 12:00:00 200 mg via SUBCUTANEOUS
  Filled 2018-11-25: qty 8

## 2018-11-25 NOTE — Progress Notes (Signed)
Today's lab results reviewed with MD, Dr. Janese Banks. Per MD order: proceed with scheduled Vidaza treatment today.

## 2018-11-25 NOTE — Progress Notes (Signed)
Pt states that he feels fine. He said [facility] nurse came out last Friday and says that he needs to start taking vit d and need a different type of b/p-they are getting in touch with PCP-they did not draw labs at his house

## 2018-11-25 NOTE — Telephone Encounter (Signed)
Called pt and left message on recorder about the pt. Iron studies were low and Dr/Rao would like to add iron on for him and we will add it to his visit tom.[name] and Thursday of this week and tues of next week . If we get the iron stores to be higher then hopefully will make hgb higher too. I have already spoke to his daughter and will le both of you know the status of bone marrow bx tom.

## 2018-11-26 ENCOUNTER — Other Ambulatory Visit: Payer: Self-pay

## 2018-11-26 ENCOUNTER — Inpatient Hospital Stay: Payer: Medicare Other

## 2018-11-26 ENCOUNTER — Encounter: Payer: Self-pay | Admitting: Oncology

## 2018-11-26 VITALS — BP 111/64 | HR 100 | Temp 98.3°F | Resp 20

## 2018-11-26 DIAGNOSIS — C9310 Chronic myelomonocytic leukemia not having achieved remission: Secondary | ICD-10-CM | POA: Diagnosis not present

## 2018-11-26 DIAGNOSIS — Z5111 Encounter for antineoplastic chemotherapy: Secondary | ICD-10-CM | POA: Diagnosis not present

## 2018-11-26 DIAGNOSIS — E611 Iron deficiency: Secondary | ICD-10-CM | POA: Diagnosis not present

## 2018-11-26 DIAGNOSIS — D509 Iron deficiency anemia, unspecified: Secondary | ICD-10-CM

## 2018-11-26 MED ORDER — SODIUM CHLORIDE 0.9 % IV SOLN: 200.0000 mg | Freq: Once | INTRAVENOUS | Status: DC

## 2018-11-26 MED ORDER — AZACITIDINE CHEMO SQ INJECTION
75.0000 mg/m2 | Freq: Once | INTRAMUSCULAR | Status: AC
  Administered 2018-11-26: 200 mg via SUBCUTANEOUS
  Filled 2018-11-26: qty 8

## 2018-11-26 MED ORDER — IRON SUCROSE 20 MG/ML IV SOLN
200.0000 mg | Freq: Once | INTRAVENOUS | Status: AC
  Administered 2018-11-26: 14:00:00 200 mg via INTRAVENOUS
  Filled 2018-11-26: qty 10

## 2018-11-26 MED ORDER — ONDANSETRON HCL 4 MG PO TABS
8.0000 mg | ORAL_TABLET | Freq: Once | ORAL | Status: AC
  Administered 2018-11-26: 14:00:00 8 mg via ORAL
  Filled 2018-11-26: qty 2

## 2018-11-26 MED ORDER — SODIUM CHLORIDE 0.9 % IV SOLN
Freq: Once | INTRAVENOUS | Status: AC
  Administered 2018-11-26: 14:00:00 via INTRAVENOUS
  Filled 2018-11-26: qty 250

## 2018-11-26 NOTE — Progress Notes (Signed)
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 He has recently been having bilateral knee pain and has undergone steroid injection in his knee.Patient was noted to have a high white count on his routine exam. His white count was 19.2 on 07/22/2018 with an H&H of 11.4/35.1 and a platelet count of 470. At that point he was prescribed a course of antibiotics and a repeat blood work on 08/21/2018 showed white count of 42.3, H&H of 7.1/23 with an MCV of 84 and a platelet count of 791 and hence the patient was referred to hematology for further management.  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 In addition the core biopsy showed a small focus of fibrosis containing scattering of mast cells and eosinophils most suggestive of systemic mastocytosis.bone marrow blasts 7%.Flow cytometry showed increased number of monocytic cells representing 16% of all cells with expression for HLA-DR, CD11b, CD13, CD14, CD33, CD38, CD56 and CD56 and CD64 with [facility] CD 117 are CD34.normal cytogenetics, CBL, SRSF2, SH2B3 and TET2   Interval history-patient feels well and denies any complaints at this time other than mild fatigue  ECOG PS- 1 Pain scale- 0   Review of systems- Review of Systems  Constitutional: Positive for malaise/fatigue. Negative for chills, fever and weight loss.  HENT: Negative for congestion, ear discharge and  nosebleeds.   Eyes: Negative for blurred vision.  Respiratory: Negative for cough, hemoptysis, sputum production, shortness of breath and wheezing.   Cardiovascular: Negative for chest pain, palpitations, orthopnea and claudication.  Gastrointestinal: Negative for abdominal pain, blood in stool, constipation, diarrhea, heartburn, melena, nausea and vomiting.  Genitourinary: Negative for dysuria, flank pain, frequency, hematuria and urgency.  Musculoskeletal: Negative for back pain, joint pain and myalgias.  Skin: Negative for rash.  Neurological: Negative for dizziness, tingling, focal weakness, seizures, weakness and headaches.  Endo/Heme/Allergies: Does not bruise/bleed easily.  Psychiatric/Behavioral: Negative for depression and suicidal ideas. The patient does not have insomnia.        No Known Allergies   Past Medical History:  Diagnosis Date  . Anemia   . 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 Hypertension      Past Surgical History:  Procedure Laterality Date  . COLONOSCOPY WITH PROPOFOL N/A 05/03/2015   Procedure: COLONOSCOPY WITH PROPOFOL;  Surgeon: [name], MD;  Location[facility] ENDOSCOPY;  Service: Gastroenterology;  Laterality: N/A;  . ESOPHAGOGASTRODUODENOSCOPY (EGD) WITH PROPOFOL N/A 05/03/2015   Procedure: ESOPHAGOGASTRODUODENOSCOPY (EGD) WITH PROPOFOL;  Surgeon: [name], MD;  Location: [facility] ENDOSCOPY;  Service: Gastroenterology;  Laterality: N/A;  . HERNIA REPAIR     1975    Social History   Socioeconomic History  . Marital status: Divorced    Spouse name: Not on file  . Number of children: 4  . Years of education: Not on file  . Highest education level: Not on file  Occupational History  . Not on file  Social Needs  . Emergency planning/management officer  strain: Not hard at all  . Food insecurity    Worry: Never true    Inability: Never true  . Transportation needs    Medical: No    Non-medical: No  Tobacco Use  . Smoking status: Never Smoker  .  Smokeless tobacco: Never Used  Substance and Sexual Activity  . Alcohol use: No  . Drug use: No  . Sexual activity: Not on file  Lifestyle  . Physical activity    Days per week: 3 days    Minutes per session: 150+ min  . Stress: Not at all  Relationships  . Social connections    Talks on phone: More than three times a week    Gets together: More than three times a week    Attends religious service: More than 4 times per year    Active member of club or organization: No    Attends meetings of clubs or organizations: Never    Relationship status: Divorced  . Intimate partner violence    Fear of current or ex partner: Not on file    Emotionally abused: Not on file    Physically abused: Not on file    Forced sexual activity: Not on file  Other Topics Concern  . Not on file  Social History Narrative  . Not on file    Family History  Problem Relation Age of Onset  . Alzheimer's disease Father   . Lung cancer Sister   . Cancer Sister      Current Outpatient Medications:  .  allopurinol (ZYLOPRIM) 300 MG tablet, Take 1 tablet (300 mg total) by mouth daily., Disp: 30 tablet, Rfl: 3 .  aspirin EC 81 MG tablet, Take 81 mg by mouth daily., Disp: , Rfl:  .  cyanocobalamin 500 MCG tablet, Take 500 mcg by mouth daily., Disp: , Rfl:  .  ibuprofen (ADVIL,MOTRIN) 200 MG tablet, Take 200 mg by mouth every 6 (six) hours as needed. , Disp: , Rfl:  .  KLOR-CON M20 20 MEQ tablet, TAKE 1 TABLET BY MOUTH TWICE A DAY, Disp: 60 tablet, Rfl: 0 .  pantoprazole (PROTONIX) 40 MG tablet, Take 1 tablet (40 mg total) by mouth daily., Disp: 90 tablet, Rfl: 1 .  valACYclovir (VALTREX) 500 MG tablet, Take 1 tablet (500 mg total) by mouth daily., Disp: 30 tablet, Rfl: 3 .  valsartan-hydrochlorothiazide (DIOVAN-HCT) 320-25 MG tablet, Take 1 tablet by mouth daily., Disp: 90 tablet, Rfl: 2 .  colchicine 0.6 MG tablet, Take 2 tablets (1.50m) by mouth at first sign of gout flare followed by 1 tablet (0.643m after  1 hour. (Max 1.39m61mithin 1 hour), Disp: , Rfl:  .  ondansetron (ZOFRAN) 8 MG tablet, Take 1 tablet (8 mg total) by mouth 2 (two) times daily as needed (Nausea or vomiting). (Patient not taking: Reported on 09/30/2018), Disp: 30 tablet, Rfl: 1 .  prochlorperazine (COMPAZINE) 10 MG tablet, Take 1 tablet (10 mg total) by mouth every 6 (six) hours as needed (Nausea or vomiting). (Patient not taking: Reported on 09/30/2018), Disp: 30 tablet, Rfl: 1 .  tiZANidine (ZANAFLEX) 4 MG tablet, Take 4 mg by mouth every 6 (six) hours as needed. , Disp: , Rfl:   Physical exam:  Vitals:   11/25/18 0907  BP: 118/66  Pulse: 84  Resp: 16  Temp: 97.9 F (36.6 C)  TempSrc: Tympanic  Weight: (!) 315 lb 9 oz (143.1 kg)   Physical Exam Constitutional:      General: He is not  in acute distress.    Appearance: He is obese.  HENT:     Head: Normocephalic and atraumatic.  Eyes:     Pupils: Pupils are equal, round, and reactive to light.  Neck:     Musculoskeletal: Normal range of motion.  Cardiovascular:     Rate and Rhythm: Normal rate and regular rhythm.     Heart sounds: Normal heart sounds.  Pulmonary:     Effort: Pulmonary effort is normal.     Breath sounds: Normal breath sounds.  Abdominal:     General: Bowel sounds are normal.     Palpations: Abdomen is soft.  Skin:    General: Skin is warm and dry.  Neurological:     Mental Status: He is alert and oriented to person, place, and time.      CMP Latest Ref Rng & Units 11/25/2018  Glucose 70 - 99 mg/dL 105(H)  BUN 8 - 23 mg/dL 17  Creatinine 0.61 - 1.24 mg/dL 1.02  Sodium 135 - 145 mmol/L 136  Potassium 3.5 - 5.1 mmol/L 4.1  Chloride 98 - 111 mmol/L 102  CO2 22 - 32 mmol/L 26  Calcium 8.9 - 10.3 mg/dL 9.0  Total Protein 6.5 - 8.1 g/dL 7.9  Total Bilirubin 0.3 - 1.2 mg/dL 0.5  Alkaline Phos 38 - 126 U/L 84  AST 15 - 41 U/L 12(L)  ALT 0 - 44 U/L 11   CBC Latest Ref Rng & Units 11/25/2018  WBC 4.0 - 10.5 K/uL 6.7  Hemoglobin 13.0 - 17.0  g/dL 8.7(L)  Hematocrit 39.0 - 52.0 % 29.5(L)  Platelets 150 - 400 K/uL 1,366(HH)     Assessment and plan- Patient is a 77 y.o. male with CMML 1.  He is here for on treatment assessment prior to cycle 3-day 1 of Vidaza  Patient's white cell count was in the 40s at the start of treatment which has now normalized.  Hemoglobin is always remained between 7-8 despite treatment.  He does have some evidence of microcytosis and iron deficiency and I will therefore proceed with giving him Venofer 200 mg IV x5 doses over 3 weeks to see if that makes a difference to his anemia.   Thrombocytosis: Patient does have baseline thrombocytosis and his platelet counts have been widely fluctuating anywhere between 400-700.  They have never been as high as 1366.  Unclear if partly this is reactive secondary to iron deficiency as well.  I will see what his platelet counts do after IV iron as well as ongoing Vidaza treatment.  Hold off on starting additional Hydrea at this time as it can worsen his anemia  Counts okay to proceed with cycle 3-day 1 of Vidaza today.  I will see him back in 1 week's time on 12/03/2018.  Depending on his counts I will decide if he needs 1 marrow biopsy now or after 4 cycles.   Visit Diagnosis 1. 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 Iron deficiency anemia, unspecified iron deficiency anemia type   3. Encounter for antineoplastic chemotherapy   4. Thrombocytosis ([place])      Dr. Randa Evens, MD, MPH [facility] at [facility] [phone] 11/26/2018 8:24 AM

## 2018-11-27 ENCOUNTER — Other Ambulatory Visit: Payer: Self-pay

## 2018-11-27 ENCOUNTER — Inpatient Hospital Stay: Payer: Medicare Other

## 2018-11-27 VITALS — BP 90/53 | HR 110 | Temp 98.0°F | Resp 20

## 2018-11-27 DIAGNOSIS — C9310 Chronic myelomonocytic leukemia not having achieved remission: Secondary | ICD-10-CM

## 2018-11-27 DIAGNOSIS — Z5111 Encounter for antineoplastic chemotherapy: Secondary | ICD-10-CM | POA: Diagnosis not present

## 2018-11-27 DIAGNOSIS — E611 Iron deficiency: Secondary | ICD-10-CM | POA: Diagnosis not present

## 2018-11-27 MED ORDER — AZACITIDINE CHEMO SQ INJECTION
75.0000 mg/m2 | Freq: Once | INTRAMUSCULAR | Status: AC
  Administered 2018-11-27: 200 mg via SUBCUTANEOUS
  Filled 2018-11-27: qty 8

## 2018-11-27 MED ORDER — ONDANSETRON HCL 4 MG PO TABS
8.0000 mg | ORAL_TABLET | Freq: Once | ORAL | Status: AC
  Administered 2018-11-27: 8 mg via ORAL
  Filled 2018-11-27: qty 2

## 2018-11-28 ENCOUNTER — Other Ambulatory Visit: Payer: Self-pay

## 2018-11-28 ENCOUNTER — Inpatient Hospital Stay: Payer: Medicare Other

## 2018-11-28 VITALS — BP 90/60 | HR 92 | Temp 97.1°F | Resp 20

## 2018-11-28 DIAGNOSIS — E611 Iron deficiency: Secondary | ICD-10-CM | POA: Diagnosis not present

## 2018-11-28 DIAGNOSIS — C9310 Chronic myelomonocytic leukemia not having achieved remission: Secondary | ICD-10-CM

## 2018-11-28 DIAGNOSIS — Z5111 Encounter for antineoplastic chemotherapy: Secondary | ICD-10-CM | POA: Diagnosis not present

## 2018-11-28 LAB — CBC WITH DIFFERENTIAL/PLATELET
Abs Immature Granulocytes: 0.04 10*3/uL (ref 0.00–0.07)
Basophils Absolute: 0.1 10*3/uL (ref 0.0–0.1)
Basophils Relative: 1 %
Eosinophils Absolute: 0.3 10*3/uL (ref 0.0–0.5)
Eosinophils Relative: 5 %
HCT: 28.6 % — ABNORMAL LOW (ref 39.0–52.0)
Hemoglobin: 8.5 g/dL — ABNORMAL LOW (ref 13.0–17.0)
Immature Granulocytes: 1 %
Lymphocytes Relative: 26 %
Lymphs Abs: 1.5 10*3/uL (ref 0.7–4.0)
MCH: 23.9 pg — ABNORMAL LOW (ref 26.0–34.0)
MCHC: 29.7 g/dL — ABNORMAL LOW (ref 30.0–36.0)
MCV: 80.6 fL (ref 80.0–100.0)
Monocytes Absolute: 0.4 10*3/uL (ref 0.1–1.0)
Monocytes Relative: 6 %
Neutro Abs: 3.6 10*3/uL (ref 1.7–7.7)
Neutrophils Relative %: 61 %
Platelets: 776 10*3/uL — ABNORMAL HIGH (ref 150–400)
RBC: 3.55 MIL/uL — ABNORMAL LOW (ref 4.22–5.81)
RDW: 22.5 % — ABNORMAL HIGH (ref 11.5–15.5)
Smear Review: INCREASED
WBC Morphology: ABNORMAL
WBC: 5.8 10*3/uL (ref 4.0–10.5)
nRBC: 0 % (ref 0.0–0.2)

## 2018-11-28 MED ORDER — ONDANSETRON HCL 4 MG PO TABS
8.0000 mg | ORAL_TABLET | Freq: Once | ORAL | Status: AC
  Administered 2018-11-28: 14:00:00 8 mg via ORAL
  Filled 2018-11-28: qty 2

## 2018-11-28 MED ORDER — AZACITIDINE CHEMO SQ INJECTION
75.0000 mg/m2 | Freq: Once | INTRAMUSCULAR | Status: AC
  Administered 2018-11-28: 14:00:00 200 mg via SUBCUTANEOUS
  Filled 2018-11-28: qty 8

## 2018-11-29 ENCOUNTER — Other Ambulatory Visit: Payer: Self-pay

## 2018-11-29 ENCOUNTER — Inpatient Hospital Stay: Payer: Medicare Other

## 2018-11-29 VITALS — BP 108/61 | HR 89 | Temp 97.0°F | Resp 19

## 2018-11-29 DIAGNOSIS — Z5111 Encounter for antineoplastic chemotherapy: Secondary | ICD-10-CM | POA: Diagnosis not present

## 2018-11-29 DIAGNOSIS — C9310 Chronic myelomonocytic leukemia not having achieved remission: Secondary | ICD-10-CM

## 2018-11-29 DIAGNOSIS — E611 Iron deficiency: Secondary | ICD-10-CM | POA: Diagnosis not present

## 2018-11-29 MED ORDER — ONDANSETRON HCL 4 MG PO TABS
8.0000 mg | ORAL_TABLET | Freq: Once | ORAL | Status: AC
  Administered 2018-11-29: 14:00:00 8 mg via ORAL
  Filled 2018-11-29: qty 2

## 2018-11-29 MED ORDER — AZACITIDINE CHEMO SQ INJECTION
75.0000 mg/m2 | Freq: Once | INTRAMUSCULAR | Status: AC
  Administered 2018-11-29: 14:00:00 200 mg via SUBCUTANEOUS
  Filled 2018-11-29: qty 8

## 2018-11-29 NOTE — Progress Notes (Signed)
Labs reviewed with MD, verbal order to continue with treatment today. Pt updated and all questions answered at this time.   Kerriann Kamphuis [employer]

## 2018-12-02 ENCOUNTER — Inpatient Hospital Stay: Payer: Medicare Other

## 2018-12-02 ENCOUNTER — Other Ambulatory Visit: Payer: Self-pay

## 2018-12-02 VITALS — BP 104/63 | HR 99 | Temp 98.1°F | Resp 20

## 2018-12-02 DIAGNOSIS — E611 Iron deficiency: Secondary | ICD-10-CM | POA: Diagnosis not present

## 2018-12-02 DIAGNOSIS — C9310 Chronic myelomonocytic leukemia not having achieved remission: Secondary | ICD-10-CM

## 2018-12-02 DIAGNOSIS — Z5111 Encounter for antineoplastic chemotherapy: Secondary | ICD-10-CM | POA: Diagnosis not present

## 2018-12-02 MED ORDER — AZACITIDINE CHEMO SQ INJECTION
75.0000 mg/m2 | Freq: Once | INTRAMUSCULAR | Status: AC
  Administered 2018-12-02: 14:00:00 200 mg via SUBCUTANEOUS
  Filled 2018-12-02: qty 8

## 2018-12-02 MED ORDER — ONDANSETRON HCL 4 MG PO TABS
8.0000 mg | ORAL_TABLET | Freq: Once | ORAL | Status: AC
  Administered 2018-12-02: 8 mg via ORAL
  Filled 2018-12-02: qty 2

## 2018-12-02 NOTE — Progress Notes (Signed)
Patient is here for follow up he is doing well no complaints  

## 2018-12-03 ENCOUNTER — Encounter: Payer: Self-pay | Admitting: Oncology

## 2018-12-03 ENCOUNTER — Inpatient Hospital Stay: Payer: Medicare Other

## 2018-12-03 ENCOUNTER — Inpatient Hospital Stay (HOSPITAL_BASED_OUTPATIENT_CLINIC_OR_DEPARTMENT_OTHER): Payer: Medicare Other | Admitting: Oncology

## 2018-12-03 ENCOUNTER — Other Ambulatory Visit: Payer: Self-pay

## 2018-12-03 VITALS — BP 123/77 | HR 89 | Temp 96.7°F | Resp 18

## 2018-12-03 VITALS — BP 115/59 | HR 94 | Temp 96.8°F | Resp 18 | Wt 308.5 lb

## 2018-12-03 DIAGNOSIS — C9310 Chronic myelomonocytic leukemia not having achieved remission: Secondary | ICD-10-CM

## 2018-12-03 DIAGNOSIS — D509 Iron deficiency anemia, unspecified: Secondary | ICD-10-CM

## 2018-12-03 DIAGNOSIS — Z5111 Encounter for antineoplastic chemotherapy: Secondary | ICD-10-CM | POA: Diagnosis not present

## 2018-12-03 DIAGNOSIS — D473 Essential (hemorrhagic) thrombocythemia: Secondary | ICD-10-CM

## 2018-12-03 DIAGNOSIS — D75839 Thrombocytosis, unspecified: Secondary | ICD-10-CM

## 2018-12-03 DIAGNOSIS — E611 Iron deficiency: Secondary | ICD-10-CM | POA: Diagnosis not present

## 2018-12-03 LAB — CBC WITH DIFFERENTIAL/PLATELET
Abs Immature Granulocytes: 0.03 10*3/uL (ref 0.00–0.07)
Basophils Absolute: 0 10*3/uL (ref 0.0–0.1)
Basophils Relative: 1 %
Eosinophils Absolute: 0.4 10*3/uL (ref 0.0–0.5)
Eosinophils Relative: 9 %
HCT: 30 % — ABNORMAL LOW (ref 39.0–52.0)
Hemoglobin: 8.9 g/dL — ABNORMAL LOW (ref 13.0–17.0)
Immature Granulocytes: 1 %
Lymphocytes Relative: 29 %
Lymphs Abs: 1.3 10*3/uL (ref 0.7–4.0)
MCH: 24.1 pg — ABNORMAL LOW (ref 26.0–34.0)
MCHC: 29.7 g/dL — ABNORMAL LOW (ref 30.0–36.0)
MCV: 81.1 fL (ref 80.0–100.0)
Monocytes Absolute: 0.8 10*3/uL (ref 0.1–1.0)
Monocytes Relative: 17 %
Neutro Abs: 2 10*3/uL (ref 1.7–7.7)
Neutrophils Relative %: 43 %
Platelets: 264 10*3/uL (ref 150–400)
RBC: 3.7 MIL/uL — ABNORMAL LOW (ref 4.22–5.81)
RDW: 23.2 % — ABNORMAL HIGH (ref 11.5–15.5)
Smear Review: ADEQUATE
WBC: 4.7 10*3/uL (ref 4.0–10.5)
nRBC: 0 % (ref 0.0–0.2)

## 2018-12-03 MED ORDER — AZACITIDINE CHEMO SQ INJECTION
75.0000 mg/m2 | Freq: Once | INTRAMUSCULAR | Status: AC
  Administered 2018-12-03: 12:00:00 200 mg via SUBCUTANEOUS
  Filled 2018-12-03: qty 8

## 2018-12-03 MED ORDER — ONDANSETRON HCL 4 MG PO TABS
8.0000 mg | ORAL_TABLET | Freq: Once | ORAL | Status: AC
  Administered 2018-12-03: 11:00:00 8 mg via ORAL
  Filled 2018-12-03: qty 2

## 2018-12-03 MED ORDER — IRON SUCROSE 20 MG/ML IV SOLN
200.0000 mg | Freq: Once | INTRAVENOUS | Status: AC
  Administered 2018-12-03: 200 mg via INTRAVENOUS
  Filled 2018-12-03: qty 10

## 2018-12-03 MED ORDER — SODIUM CHLORIDE 0.9 % IV SOLN
Freq: Once | INTRAVENOUS | Status: AC
  Administered 2018-12-03: 11:00:00 via INTRAVENOUS
  Filled 2018-12-03: qty 250

## 2018-12-03 MED ORDER — SODIUM CHLORIDE 0.9 % IV SOLN: 200.0000 mg | Freq: Once | INTRAVENOUS | Status: DC

## 2018-12-03 NOTE — Progress Notes (Signed)
Hematology/Oncology Consult note 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 He has recently been having bilateral knee pain and has undergone steroid injection in his knee.Patient was noted to have a high white count on his routine exam. His white count was 19.2 on 07/22/2018 with an H&H of 11.4/35.1 and a platelet count of 470. At that point he was prescribed a course of antibiotics and a repeat blood work on 08/21/2018 showed white count of 42.3, H&H of 7.1/23 with an MCV of 84 and a platelet count of 791 and hence the patient was referred to hematology for further management.  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 In addition the core biopsy showed a small focus of fibrosis containing scattering of mast cells and eosinophils most suggestive of systemic mastocytosis.bone marrow blasts 7%.Flow cytometry showed increased number of monocytic cells representing 16% of all cells with expression for HLA-DR, CD11b, CD13, CD14, CD33, CD38, CD56 and CD56 and CD64 with [facility] CD 117 are CD34.normal cytogenetics, CBL, SRSF2, SH2B3 and TET2  Interval history-patient is doing well and currently denies any complaints  ECOG PS- 1 Pain scale- 0   Review of systems- Review of Systems  Constitutional: Negative for chills, fever, malaise/fatigue and weight loss.  HENT: Negative for congestion, ear discharge and nosebleeds.   Eyes: Negative for blurred vision.   Respiratory: Negative for cough, hemoptysis, sputum production, shortness of breath and wheezing.   Cardiovascular: Negative for chest pain, palpitations, orthopnea and claudication.  Gastrointestinal: Negative for abdominal pain, blood in stool, constipation, diarrhea, heartburn, melena, nausea and vomiting.  Genitourinary: Negative for dysuria, flank pain, frequency, hematuria and urgency.  Musculoskeletal: Negative for back pain, joint pain and myalgias.  Skin: Negative for rash.  Neurological: Negative for dizziness, tingling, focal weakness, seizures, weakness and headaches.  Endo/Heme/Allergies: Does not bruise/bleed easily.  Psychiatric/Behavioral: Negative for depression and suicidal ideas. The patient does not have insomnia.       No Known Allergies   Past Medical History:  Diagnosis Date  . Anemia   . 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 Hypertension      Past Surgical History:  Procedure Laterality Date  . COLONOSCOPY WITH PROPOFOL N/A 05/03/2015   Procedure: COLONOSCOPY WITH PROPOFOL;  Surgeon: [name], MD;  Location: [facility] ENDOSCOPY;  Service: Gastroenterology;  Laterality: N/A;  . ESOPHAGOGASTRODUODENOSCOPY (EGD) WITH PROPOFOL N/A 05/03/2015   Procedure: ESOPHAGOGASTRODUODENOSCOPY (EGD) WITH PROPOFOL;  Surgeon: [name], MD;  Location: [facility] ENDOSCOPY;  Service: Gastroenterology;  Laterality: N/A;  . HERNIA REPAIR     1975    Social History   Socioeconomic History  . Marital status: Divorced    Spouse name: Not on file  . Number of children: 4  . Years of education: Not on file  . Highest education level: Not on file  Occupational History  . Not on file  Social Needs  . Financial resource strain: Not hard at all  . Food insecurity  Worry: Never true    Inability: Never true  . Transportation needs    Medical: No    Non-medical: No  Tobacco Use  . Smoking status: Never Smoker  . Smokeless tobacco: Never Used  Substance and Sexual  Activity  . Alcohol use: No  . Drug use: No  . Sexual activity: Not on file  Lifestyle  . Physical activity    Days per week: 3 days    Minutes per session: 150+ min  . Stress: Not at all  Relationships  . Social connections    Talks on phone: More than three times a week    Gets together: More than three times a week    Attends religious service: More than 4 times per year    Active member of club or organization: No    Attends meetings of clubs or organizations: Never    Relationship status: Divorced  . Intimate partner violence    Fear of current or ex partner: Not on file    Emotionally abused: Not on file    Physically abused: Not on file    Forced sexual activity: Not on file  Other Topics Concern  . Not on file  Social History Narrative  . Not on file    Family History  Problem Relation Age of Onset  . Alzheimer's disease Father   . Lung cancer Sister   . Cancer Sister      Current Outpatient Medications:  .  allopurinol (ZYLOPRIM) 300 MG tablet, Take 1 tablet (300 mg total) by mouth daily., Disp: 30 tablet, Rfl: 3 .  aspirin EC 81 MG tablet, Take 81 mg by mouth daily., Disp: , Rfl:  .  colchicine 0.6 MG tablet, Take 2 tablets (1.63m) by mouth at first sign of gout flare followed by 1 tablet (0.665m after 1 hour. (Max 1.51m66mithin 1 hour), Disp: , Rfl:  .  cyanocobalamin 500 MCG tablet, Take 500 mcg by mouth daily., Disp: , Rfl:  .  ibuprofen (ADVIL,MOTRIN) 200 MG tablet, Take 200 mg by mouth every 6 (six) hours as needed. , Disp: , Rfl:  .  KLOR-CON M20 20 MEQ tablet, TAKE 1 TABLET BY MOUTH TWICE A DAY, Disp: 60 tablet, Rfl: 0 .  pantoprazole (PROTONIX) 40 MG tablet, Take 1 tablet (40 mg total) by mouth daily., Disp: 90 tablet, Rfl: 1 .  prochlorperazine (COMPAZINE) 10 MG tablet, Take 1 tablet (10 mg total) by mouth every 6 (six) hours as needed (Nausea or vomiting)., Disp: 30 tablet, Rfl: 1 .  tiZANidine (ZANAFLEX) 4 MG tablet, Take 4 mg by mouth every 6 (six)  hours as needed. , Disp: , Rfl:  .  valACYclovir (VALTREX) 500 MG tablet, Take 1 tablet (500 mg total) by mouth daily., Disp: 30 tablet, Rfl: 3 .  valsartan-hydrochlorothiazide (DIOVAN-HCT) 320-25 MG tablet, Take 1 tablet by mouth daily., Disp: 90 tablet, Rfl: 2 .  ondansetron (ZOFRAN) 8 MG tablet, Take 1 tablet (8 mg total) by mouth 2 (two) times daily as needed (Nausea or vomiting). (Patient not taking: Reported on 09/30/2018), Disp: 30 tablet, Rfl: 1  Physical exam:  Vitals:   12/03/18 1006  BP: (!) 115/59  Pulse: 94  Resp: 18  Temp: (!) 96.8 F (36 C)  TempSrc: Tympanic  SpO2: 100%  Weight: (!) 308 lb 8 oz (139.9 kg)   Physical Exam Constitutional:      General: He is not in acute distress.    Appearance: He is obese.  HENT:  Head: Normocephalic and atraumatic.  Eyes:     Pupils: Pupils are equal, round, and reactive to light.  Neck:     Musculoskeletal: Normal range of motion.  Cardiovascular:     Rate and Rhythm: Normal rate and regular rhythm.     Heart sounds: Normal heart sounds.  Pulmonary:     Effort: Pulmonary effort is normal.     Breath sounds: Normal breath sounds.  Abdominal:     General: Bowel sounds are normal.     Palpations: Abdomen is soft.  Skin:    General: Skin is warm and dry.  Neurological:     Mental Status: He is alert and oriented to person, place, and time.      CMP Latest Ref Rng & Units 11/25/2018  Glucose 70 - 99 mg/dL 105(H)  BUN 8 - 23 mg/dL 17  Creatinine 0.61 - 1.24 mg/dL 1.02  Sodium 135 - 145 mmol/L 136  Potassium 3.5 - 5.1 mmol/L 4.1  Chloride 98 - 111 mmol/L 102  CO2 22 - 32 mmol/L 26  Calcium 8.9 - 10.3 mg/dL 9.0  Total Protein 6.5 - 8.1 g/dL 7.9  Total Bilirubin 0.3 - 1.2 mg/dL 0.5  Alkaline Phos 38 - 126 U/L 84  AST 15 - 41 U/L 12(L)  ALT 0 - 44 U/L 11   CBC Latest Ref Rng & Units 12/03/2018  WBC 4.0 - 10.5 K/uL 4.7  Hemoglobin 13.0 - 17.0 g/dL 8.9(L)  Hematocrit 39.0 - 52.0 % 30.0(L)  Platelets 150 - 400 K/uL  264    Assessment and plan- Patient is a 78 y.o. male with CMML 1.  He is here for routine follow-up of thrombocytosis  Patient's platelet count was 1300 last week has normalized to 264 today.  His hemoglobin typically runs between 8 and 9 and is in the same range.  Counts are otherwise okay to proceed with cycle 3-day 7 of Vidaza today.  I will see him back on 12/23/2018 with CBC with differential, CMP for cycle 4-day 1 of Vidaza.  Recent iron studies showed evidence of iron deficiency and he has been getting Venofer and will be getting his third dose today.  We will time his subsequent Venofer doses with his Vidaza.  I will plan to get a repeat bone marrow biopsy after 4 cycles of Vidaza.  Continue Valtrex prophylaxis.   Visit Diagnosis 1. 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 Thrombocytosis ([place])      Dr. Randa Evens, MD, MPH [facility] at [facility] [phone] 12/03/2018 1:48 PM

## 2018-12-04 ENCOUNTER — Other Ambulatory Visit: Payer: Self-pay | Admitting: *Deleted

## 2018-12-04 DIAGNOSIS — C9310 Chronic myelomonocytic leukemia not having achieved remission: Secondary | ICD-10-CM

## 2018-12-06 ENCOUNTER — Ambulatory Visit: Payer: Medicare Other

## 2018-12-17 ENCOUNTER — Ambulatory Visit: Payer: Medicare Other

## 2018-12-19 ENCOUNTER — Other Ambulatory Visit: Payer: Self-pay | Admitting: Oncology

## 2018-12-20 ENCOUNTER — Telehealth: Payer: Self-pay

## 2018-12-20 NOTE — Telephone Encounter (Signed)
Pre-visit assessment attempted prior to [place] appointment with Dr. Janese Banks on 12/23/2018. No answer / left a msg.

## 2018-12-23 ENCOUNTER — Inpatient Hospital Stay: Payer: Medicare Other

## 2018-12-23 ENCOUNTER — Inpatient Hospital Stay (HOSPITAL_BASED_OUTPATIENT_CLINIC_OR_DEPARTMENT_OTHER): Payer: Medicare Other | Admitting: Oncology

## 2018-12-23 ENCOUNTER — Encounter: Payer: Self-pay | Admitting: Oncology

## 2018-12-23 ENCOUNTER — Other Ambulatory Visit: Payer: Self-pay | Admitting: *Deleted

## 2018-12-23 ENCOUNTER — Other Ambulatory Visit: Payer: Self-pay

## 2018-12-23 ENCOUNTER — Telehealth: Payer: Self-pay

## 2018-12-23 ENCOUNTER — Inpatient Hospital Stay: Payer: Medicare Other | Attending: Oncology

## 2018-12-23 VITALS — BP 114/57 | HR 88 | Temp 97.2°F | Ht 74.5 in | Wt 314.0 lb

## 2018-12-23 DIAGNOSIS — Z5111 Encounter for antineoplastic chemotherapy: Secondary | ICD-10-CM

## 2018-12-23 DIAGNOSIS — Z23 Encounter for immunization: Secondary | ICD-10-CM | POA: Diagnosis not present

## 2018-12-23 DIAGNOSIS — D509 Iron deficiency anemia, unspecified: Secondary | ICD-10-CM | POA: Diagnosis not present

## 2018-12-23 DIAGNOSIS — C9310 Chronic myelomonocytic leukemia not having achieved remission: Secondary | ICD-10-CM

## 2018-12-23 LAB — CBC WITH DIFFERENTIAL/PLATELET
Abs Immature Granulocytes: 0.03 10*3/uL (ref 0.00–0.07)
Basophils Absolute: 0.1 10*3/uL (ref 0.0–0.1)
Basophils Relative: 1 %
Eosinophils Absolute: 0.1 10*3/uL (ref 0.0–0.5)
Eosinophils Relative: 2 %
HCT: 32.3 % — ABNORMAL LOW (ref 39.0–52.0)
Hemoglobin: 9.7 g/dL — ABNORMAL LOW (ref 13.0–17.0)
Immature Granulocytes: 1 %
Lymphocytes Relative: 24 %
Lymphs Abs: 1.6 10*3/uL (ref 0.7–4.0)
MCH: 24 pg — ABNORMAL LOW (ref 26.0–34.0)
MCHC: 30 g/dL (ref 30.0–36.0)
MCV: 80 fL (ref 80.0–100.0)
Monocytes Absolute: 0.1 10*3/uL (ref 0.1–1.0)
Monocytes Relative: 2 %
Neutro Abs: 4.6 10*3/uL (ref 1.7–7.7)
Neutrophils Relative %: 70 %
Platelets: 1322 10*3/uL (ref 150–400)
RBC: 4.04 MIL/uL — ABNORMAL LOW (ref 4.22–5.81)
RDW: 26.1 % — ABNORMAL HIGH (ref 11.5–15.5)
Smear Review: INCREASED
WBC: 6.5 10*3/uL (ref 4.0–10.5)
nRBC: 0.3 % — ABNORMAL HIGH (ref 0.0–0.2)

## 2018-12-23 LAB — COMPREHENSIVE METABOLIC PANEL
ALT: 13 U/L (ref 0–44)
AST: 13 U/L — ABNORMAL LOW (ref 15–41)
Albumin: 4.1 g/dL (ref 3.5–5.0)
Alkaline Phosphatase: 87 U/L (ref 38–126)
Anion gap: 6 (ref 5–15)
BUN: 23 mg/dL (ref 8–23)
CO2: 24 mmol/L (ref 22–32)
Calcium: 9.3 mg/dL (ref 8.9–10.3)
Chloride: 104 mmol/L (ref 98–111)
Creatinine, Ser: 1.02 mg/dL (ref 0.61–1.24)
GFR calc Af Amer: >60 mL/min (ref >60)
GFR calc non Af Amer: >60 mL/min (ref >60)
Glucose, Bld: 106 mg/dL — ABNORMAL HIGH (ref 70–99)
Potassium: 4 mmol/L (ref 3.5–5.1)
Sodium: 134 mmol/L — ABNORMAL LOW (ref 135–145)
Total Bilirubin: 0.6 mg/dL (ref 0.3–1.2)
Total Protein: 8.3 g/dL — ABNORMAL HIGH (ref 6.5–8.1)

## 2018-12-23 LAB — LACTATE DEHYDROGENASE: LDH: 142 U/L (ref 98–192)

## 2018-12-23 MED ORDER — AZACITIDINE CHEMO SQ INJECTION
75.0000 mg/m2 | Freq: Once | INTRAMUSCULAR | Status: AC
  Administered 2018-12-23: 11:00:00 200 mg via SUBCUTANEOUS
  Filled 2018-12-23: qty 8

## 2018-12-23 MED ORDER — ONDANSETRON HCL 4 MG PO TABS
8.0000 mg | ORAL_TABLET | Freq: Once | ORAL | Status: AC
  Administered 2018-12-23: 8 mg via ORAL
  Filled 2018-12-23: qty 2

## 2018-12-23 NOTE — Progress Notes (Signed)
RN reviewed labs with MD and treatment team. Verbal order to proceed with treatment today. Pt updated and all questions were answered at this time.   Wrenley Sayed [employer]

## 2018-12-23 NOTE — Progress Notes (Signed)
Patient stated that he had been doing well with no complaints. 

## 2018-12-23 NOTE — Progress Notes (Signed)
MD ok to proceed with labs today  

## 2018-12-23 NOTE — Telephone Encounter (Signed)
Called patient and he stated that he would want for me to call his daughter-Odella and give her the information for the biopsy. Odella was called and given her the biopsy appointment date and time. Odella had no further questions.

## 2018-12-24 ENCOUNTER — Inpatient Hospital Stay: Payer: Medicare Other

## 2018-12-24 ENCOUNTER — Other Ambulatory Visit: Payer: Self-pay

## 2018-12-24 VITALS — BP 114/76 | HR 85 | Temp 98.0°F | Resp 19

## 2018-12-24 DIAGNOSIS — D509 Iron deficiency anemia, unspecified: Secondary | ICD-10-CM

## 2018-12-24 DIAGNOSIS — Z5111 Encounter for antineoplastic chemotherapy: Secondary | ICD-10-CM | POA: Diagnosis not present

## 2018-12-24 DIAGNOSIS — C9310 Chronic myelomonocytic leukemia not having achieved remission: Secondary | ICD-10-CM

## 2018-12-24 DIAGNOSIS — Z23 Encounter for immunization: Secondary | ICD-10-CM | POA: Diagnosis not present

## 2018-12-24 MED ORDER — SODIUM CHLORIDE 0.9 % IV SOLN
Freq: Once | INTRAVENOUS | Status: AC
  Administered 2018-12-24: 14:00:00 via INTRAVENOUS
  Filled 2018-12-24: qty 250

## 2018-12-24 MED ORDER — IRON SUCROSE 20 MG/ML IV SOLN
200.0000 mg | Freq: Once | INTRAVENOUS | Status: AC
  Administered 2018-12-24: 14:00:00 200 mg via INTRAVENOUS
  Filled 2018-12-24: qty 10

## 2018-12-24 MED ORDER — AZACITIDINE CHEMO SQ INJECTION
75.0000 mg/m2 | Freq: Once | INTRAMUSCULAR | Status: AC
  Administered 2018-12-24: 200 mg via SUBCUTANEOUS
  Filled 2018-12-24: qty 8

## 2018-12-24 MED ORDER — ONDANSETRON HCL 4 MG PO TABS
8.0000 mg | ORAL_TABLET | Freq: Once | ORAL | Status: AC
  Administered 2018-12-24: 8 mg via ORAL
  Filled 2018-12-24: qty 2

## 2018-12-24 NOTE — Progress Notes (Signed)
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 He has recently been having bilateral knee pain and has undergone steroid injection in his knee.Patient was noted to have a high white count on his routine exam. His white count was 19.2 on 07/22/2018 with an H&H of 11.4/35.1 and a platelet count of 470. At that point he was prescribed a course of antibiotics and a repeat blood work on 08/21/2018 showed white count of 42.3, H&H of 7.1/23 with an MCV of 84 and a platelet count of 791 and hence the patient was referred to hematology for further management.  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 In addition the core biopsy showed a small focus of fibrosis containing scattering of mast cells and eosinophils most suggestive of systemic mastocytosis.bone marrow blasts 7%.Flow cytometry showed increased number of monocytic cells representing 16% of all cells with expression for HLA-DR, CD11b, CD13, CD14, CD33, CD38, CD56 and CD56 and CD64 with [facility] CD 117 are CD34.normal cytogenetics, CBL, SRSF2, SH2B3 and TET2  Interval history-patient reports doing well overall and denies any complaints at this time.  Denies any pain and he remains independent of his ADLs and IADLs.  ECOG PS- 1 Pain scale- 0   Review of systems- Review of Systems  Constitutional: Positive for malaise/fatigue. Negative for chills, fever and weight loss.  HENT:  Negative for congestion, ear discharge and nosebleeds.   Eyes: Negative for blurred vision.  Respiratory: Negative for cough, hemoptysis, sputum production, shortness of breath and wheezing.   Cardiovascular: Negative for chest pain, palpitations, orthopnea and claudication.  Gastrointestinal: Negative for abdominal pain, blood in stool, constipation, diarrhea, heartburn, melena, nausea and vomiting.  Genitourinary: Negative for dysuria, flank pain, frequency, hematuria and urgency.  Musculoskeletal: Negative for back pain, joint pain and myalgias.  Skin: Negative for rash.  Neurological: Negative for dizziness, tingling, focal weakness, seizures, weakness and headaches.  Endo/Heme/Allergies: Does not bruise/bleed easily.  Psychiatric/Behavioral: Negative for depression and suicidal ideas. The patient does not have insomnia.        No Known Allergies   Past Medical History:  Diagnosis Date  . Anemia   . 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 Hypertension      Past Surgical History:  Procedure Laterality Date  . COLONOSCOPY WITH PROPOFOL N/A 05/03/2015   Procedure: COLONOSCOPY WITH PROPOFOL;  Surgeon: [name], MD;  Location: [facility] ENDOSCOPY;  Service: Gastroenterology;  Laterality: N/A;  . ESOPHAGOGASTRODUODENOSCOPY (EGD) WITH PROPOFOL N/A 05/03/2015   Procedure: ESOPHAGOGASTRODUODENOSCOPY (EGD) WITH PROPOFOL;  Surgeon: [name], MD;  Location: [facility] ENDOSCOPY;  Service: Gastroenterology;  Laterality: N/A;  . HERNIA REPAIR     1975    Social History   Socioeconomic History  . Marital status: Divorced    Spouse name: Not on file  . Number of children: 4  . Years of education: Not on file  . Highest education level: Not on file  Occupational History  . Not  on file  Social Needs  . Financial resource strain: Not hard at all  . Food insecurity    Worry: Never true    Inability: Never true  . Transportation needs    Medical: No    Non-medical: No  Tobacco Use   . Smoking status: Never Smoker  . Smokeless tobacco: Never Used  Substance and Sexual Activity  . Alcohol use: No  . Drug use: No  . Sexual activity: Not on file  Lifestyle  . Physical activity    Days per week: 3 days    Minutes per session: 150+ min  . Stress: Not at all  Relationships  . Social connections    Talks on phone: More than three times a week    Gets together: More than three times a week    Attends religious service: More than 4 times per year    Active member of club or organization: No    Attends meetings of clubs or organizations: Never    Relationship status: Divorced  . Intimate partner violence    Fear of current or ex partner: Not on file    Emotionally abused: Not on file    Physically abused: Not on file    Forced sexual activity: Not on file  Other Topics Concern  . Not on file  Social History Narrative  . Not on file    Family History  Problem Relation Age of Onset  . Alzheimer's disease Father   . Lung cancer Sister   . Cancer Sister      Current Outpatient Medications:  .  allopurinol (ZYLOPRIM) 300 MG tablet, Take 1 tablet (300 mg total) by mouth daily., Disp: 30 tablet, Rfl: 3 .  aspirin EC 81 MG tablet, Take 81 mg by mouth daily., Disp: , Rfl:  .  cyanocobalamin 500 MCG tablet, Take 500 mcg by mouth daily., Disp: , Rfl:  .  ibuprofen (ADVIL,MOTRIN) 200 MG tablet, Take 200 mg by mouth every 6 (six) hours as needed. , Disp: , Rfl:  .  KLOR-CON M20 20 MEQ tablet, TAKE 1 TABLET BY MOUTH TWICE A DAY, Disp: 60 tablet, Rfl: 0 .  pantoprazole (PROTONIX) 40 MG tablet, Take 1 tablet (40 mg total) by mouth daily., Disp: 90 tablet, Rfl: 1 .  valACYclovir (VALTREX) 500 MG tablet, Take 1 tablet (500 mg total) by mouth daily., Disp: 30 tablet, Rfl: 3 .  valsartan-hydrochlorothiazide (DIOVAN-HCT) 320-25 MG tablet, Take 1 tablet by mouth daily., Disp: 90 tablet, Rfl: 2 .  colchicine 0.6 MG tablet, Take 2 tablets (1.80m) by mouth at first sign of gout  flare followed by 1 tablet (0.656m after 1 hour. (Max 1.91m86mithin 1 hour), Disp: , Rfl:  .  ondansetron (ZOFRAN) 8 MG tablet, Take 1 tablet (8 mg total) by mouth 2 (two) times daily as needed (Nausea or vomiting). (Patient not taking: Reported on 12/23/2018), Disp: 30 tablet, Rfl: 1 .  prochlorperazine (COMPAZINE) 10 MG tablet, Take 1 tablet (10 mg total) by mouth every 6 (six) hours as needed (Nausea or vomiting). (Patient not taking: Reported on 12/23/2018), Disp: 30 tablet, Rfl: 1 .  tiZANidine (ZANAFLEX) 4 MG tablet, Take 4 mg by mouth every 6 (six) hours as needed. , Disp: , Rfl:   Physical exam:  Vitals:   12/23/18 0926  BP: (!) 114/57  Pulse: 88  Temp: (!) 97.2 F (36.2 C)  TempSrc: Tympanic  Weight: (!) 314 lb (142.4 kg)  Height: 6' 2.5" (1.892 m)  Physical Exam Constitutional:      General: He is not in acute distress.    Appearance: He is obese.  HENT:     Head: Normocephalic and atraumatic.  Eyes:     Pupils: Pupils are equal, round, and reactive to light.  Neck:     Musculoskeletal: Normal range of motion.  Cardiovascular:     Rate and Rhythm: Normal rate and regular rhythm.     Heart sounds: Normal heart sounds.  Pulmonary:     Effort: Pulmonary effort is normal.     Breath sounds: Normal breath sounds.  Abdominal:     General: Bowel sounds are normal.     Palpations: Abdomen is soft.  Skin:    General: Skin is warm and dry.  Neurological:     Mental Status: He is alert and oriented to person, place, and time.      CMP Latest Ref Rng & Units 12/23/2018  Glucose 70 - 99 mg/dL 106(H)  BUN 8 - 23 mg/dL 23  Creatinine 0.61 - 1.24 mg/dL 1.02  Sodium 135 - 145 mmol/L 134(L)  Potassium 3.5 - 5.1 mmol/L 4.0  Chloride 98 - 111 mmol/L 104  CO2 22 - 32 mmol/L 24  Calcium 8.9 - 10.3 mg/dL 9.3  Total Protein 6.5 - 8.1 g/dL 8.3(H)  Total Bilirubin 0.3 - 1.2 mg/dL 0.6  Alkaline Phos 38 - 126 U/L 87  AST 15 - 41 U/L 13(L)  ALT 0 - 44 U/L 13   CBC Latest Ref Rng  & Units 12/23/2018  WBC 4.0 - 10.5 K/uL 6.5  Hemoglobin 13.0 - 17.0 g/dL 9.7(L)  Hematocrit 39.0 - 52.0 % 32.3(L)  Platelets 150 - 400 K/uL 1,322(HH)      Assessment and plan- Patient is a 78 y.o. male with CMML1.  He is here for on treatment assessment prior to cycle 4-day 1 of Vidaza  Patient has had significantly elevated platelet count even on the day of cycle 3-day 1 of Vidaza but subsequently normalized.  He will therefore proceed with cycle 4-day 1 of Vidaza today and continue to get it from day 1 to day 7.  I will repeat his platelet count again on day 7.  I will obtain a repeat bone marrow biopsy in 2 weeks time after 4 cycles.  I will see him back in 4 weeks time with CBC with differential, CMP prior to cycle 5-day 1 of Vidaza.  Patient will continue Valtrex prophylaxis  Patient noted to have evidence of iron deficiency anemia in the past and he will be getting 2 more doses of Venofer this week   Visit Diagnosis 1. 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 Encounter for antineoplastic chemotherapy      Dr. Randa Evens, MD, MPH [facility] [phone] 12/24/2018 12:28 PM

## 2018-12-25 ENCOUNTER — Other Ambulatory Visit: Payer: Self-pay

## 2018-12-25 ENCOUNTER — Inpatient Hospital Stay: Payer: Medicare Other

## 2018-12-25 VITALS — BP 130/68 | HR 95 | Temp 98.4°F | Resp 19

## 2018-12-25 DIAGNOSIS — C9310 Chronic myelomonocytic leukemia not having achieved remission: Secondary | ICD-10-CM

## 2018-12-25 DIAGNOSIS — D509 Iron deficiency anemia, unspecified: Secondary | ICD-10-CM | POA: Diagnosis not present

## 2018-12-25 DIAGNOSIS — Z23 Encounter for immunization: Secondary | ICD-10-CM | POA: Diagnosis not present

## 2018-12-25 DIAGNOSIS — Z5111 Encounter for antineoplastic chemotherapy: Secondary | ICD-10-CM | POA: Diagnosis not present

## 2018-12-25 MED ORDER — ONDANSETRON HCL 4 MG PO TABS
8.0000 mg | ORAL_TABLET | Freq: Once | ORAL | Status: AC
  Administered 2018-12-25: 8 mg via ORAL
  Filled 2018-12-25: qty 2

## 2018-12-25 MED ORDER — AZACITIDINE CHEMO SQ INJECTION
75.0000 mg/m2 | Freq: Once | INTRAMUSCULAR | Status: AC
  Administered 2018-12-25: 200 mg via SUBCUTANEOUS
  Filled 2018-12-25: qty 8

## 2018-12-26 ENCOUNTER — Inpatient Hospital Stay: Payer: Medicare Other

## 2018-12-26 ENCOUNTER — Other Ambulatory Visit: Payer: Self-pay

## 2018-12-26 VITALS — BP 110/62 | HR 99 | Resp 20

## 2018-12-26 DIAGNOSIS — Z5111 Encounter for antineoplastic chemotherapy: Secondary | ICD-10-CM | POA: Diagnosis not present

## 2018-12-26 DIAGNOSIS — C9310 Chronic myelomonocytic leukemia not having achieved remission: Secondary | ICD-10-CM

## 2018-12-26 DIAGNOSIS — D509 Iron deficiency anemia, unspecified: Secondary | ICD-10-CM | POA: Diagnosis not present

## 2018-12-26 DIAGNOSIS — Z23 Encounter for immunization: Secondary | ICD-10-CM | POA: Diagnosis not present

## 2018-12-26 MED ORDER — AZACITIDINE CHEMO SQ INJECTION
75.0000 mg/m2 | Freq: Once | INTRAMUSCULAR | Status: AC
  Administered 2018-12-26: 200 mg via SUBCUTANEOUS
  Filled 2018-12-26: qty 8

## 2018-12-26 MED ORDER — ONDANSETRON HCL 4 MG PO TABS
8.0000 mg | ORAL_TABLET | Freq: Once | ORAL | Status: AC
  Administered 2018-12-26: 8 mg via ORAL
  Filled 2018-12-26: qty 2

## 2018-12-27 ENCOUNTER — Other Ambulatory Visit: Payer: Self-pay

## 2018-12-27 ENCOUNTER — Inpatient Hospital Stay: Payer: Medicare Other

## 2018-12-27 VITALS — BP 108/71 | HR 96 | Temp 98.2°F | Resp 18

## 2018-12-27 DIAGNOSIS — D509 Iron deficiency anemia, unspecified: Secondary | ICD-10-CM | POA: Diagnosis not present

## 2018-12-27 DIAGNOSIS — Z5111 Encounter for antineoplastic chemotherapy: Secondary | ICD-10-CM | POA: Diagnosis not present

## 2018-12-27 DIAGNOSIS — C9310 Chronic myelomonocytic leukemia not having achieved remission: Secondary | ICD-10-CM | POA: Diagnosis not present

## 2018-12-27 DIAGNOSIS — Z23 Encounter for immunization: Secondary | ICD-10-CM | POA: Diagnosis not present

## 2018-12-27 MED ORDER — AZACITIDINE CHEMO SQ INJECTION
75.0000 mg/m2 | Freq: Once | INTRAMUSCULAR | Status: AC
  Administered 2018-12-27: 200 mg via SUBCUTANEOUS
  Filled 2018-12-27: qty 8

## 2018-12-27 MED ORDER — IRON SUCROSE 20 MG/ML IV SOLN
200.0000 mg | Freq: Once | INTRAVENOUS | Status: AC
  Administered 2018-12-27: 200 mg via INTRAVENOUS
  Filled 2018-12-27: qty 10

## 2018-12-27 MED ORDER — SODIUM CHLORIDE 0.9 % IV SOLN
Freq: Once | INTRAVENOUS | Status: AC
  Administered 2018-12-27: 13:00:00 via INTRAVENOUS
  Filled 2018-12-27: qty 250

## 2018-12-27 MED ORDER — ONDANSETRON HCL 4 MG PO TABS
8.0000 mg | ORAL_TABLET | Freq: Once | ORAL | Status: AC
  Administered 2018-12-27: 8 mg via ORAL
  Filled 2018-12-27: qty 2

## 2018-12-30 ENCOUNTER — Inpatient Hospital Stay: Payer: Medicare Other

## 2018-12-30 ENCOUNTER — Other Ambulatory Visit: Payer: Self-pay

## 2018-12-30 VITALS — BP 122/73 | HR 90 | Temp 98.2°F | Resp 18

## 2018-12-30 DIAGNOSIS — C9310 Chronic myelomonocytic leukemia not having achieved remission: Secondary | ICD-10-CM | POA: Diagnosis not present

## 2018-12-30 DIAGNOSIS — Z23 Encounter for immunization: Secondary | ICD-10-CM | POA: Diagnosis not present

## 2018-12-30 DIAGNOSIS — D509 Iron deficiency anemia, unspecified: Secondary | ICD-10-CM | POA: Diagnosis not present

## 2018-12-30 DIAGNOSIS — Z5111 Encounter for antineoplastic chemotherapy: Secondary | ICD-10-CM | POA: Diagnosis not present

## 2018-12-30 MED ORDER — ONDANSETRON HCL 4 MG PO TABS
8.0000 mg | ORAL_TABLET | Freq: Once | ORAL | Status: AC
  Administered 2018-12-30: 14:00:00 8 mg via ORAL
  Filled 2018-12-30: qty 2

## 2018-12-30 MED ORDER — AZACITIDINE CHEMO SQ INJECTION
75.0000 mg/m2 | Freq: Once | INTRAMUSCULAR | Status: AC
  Administered 2018-12-30: 200 mg via SUBCUTANEOUS
  Filled 2018-12-30: qty 8

## 2018-12-30 MED ORDER — INFLUENZA VAC A&B SA ADJ QUAD 0.5 ML IM PRSY
0.5000 mL | PREFILLED_SYRINGE | Freq: Once | INTRAMUSCULAR | Status: AC
  Administered 2018-12-30: 0.5 mL via INTRAMUSCULAR
  Filled 2018-12-30: qty 0.5

## 2018-12-31 ENCOUNTER — Other Ambulatory Visit: Payer: Self-pay

## 2018-12-31 ENCOUNTER — Inpatient Hospital Stay: Payer: Medicare Other

## 2018-12-31 VITALS — BP 104/70 | HR 88 | Temp 96.9°F | Resp 18

## 2018-12-31 DIAGNOSIS — D509 Iron deficiency anemia, unspecified: Secondary | ICD-10-CM | POA: Diagnosis not present

## 2018-12-31 DIAGNOSIS — C9310 Chronic myelomonocytic leukemia not having achieved remission: Secondary | ICD-10-CM | POA: Diagnosis not present

## 2018-12-31 DIAGNOSIS — Z5111 Encounter for antineoplastic chemotherapy: Secondary | ICD-10-CM | POA: Diagnosis not present

## 2018-12-31 DIAGNOSIS — Z23 Encounter for immunization: Secondary | ICD-10-CM | POA: Diagnosis not present

## 2018-12-31 MED ORDER — SODIUM CHLORIDE 0.9 % IV SOLN
Freq: Once | INTRAVENOUS | Status: AC
  Administered 2018-12-31: 13:00:00 via INTRAVENOUS
  Filled 2018-12-31: qty 250

## 2018-12-31 MED ORDER — AZACITIDINE CHEMO SQ INJECTION
75.0000 mg/m2 | Freq: Once | INTRAMUSCULAR | Status: AC
  Administered 2018-12-31: 200 mg via SUBCUTANEOUS
  Filled 2018-12-31: qty 8

## 2018-12-31 MED ORDER — ONDANSETRON HCL 4 MG PO TABS
8.0000 mg | ORAL_TABLET | Freq: Once | ORAL | Status: AC
  Administered 2018-12-31: 8 mg via ORAL
  Filled 2018-12-31: qty 2

## 2018-12-31 MED ORDER — IRON SUCROSE 20 MG/ML IV SOLN
200.0000 mg | Freq: Once | INTRAVENOUS | Status: AC
  Administered 2018-12-31: 200 mg via INTRAVENOUS
  Filled 2018-12-31: qty 10

## 2019-01-05 ENCOUNTER — Other Ambulatory Visit: Payer: Self-pay | Admitting: Radiology

## 2019-01-07 ENCOUNTER — Ambulatory Visit
Admission: RE | Admit: 2019-01-07 | Discharge: 2019-01-07 | Disposition: A | Discharge status: Home / Self Care | Payer: Medicare Other | Source: Ambulatory Visit | Attending: Oncology | Admitting: Oncology

## 2019-01-07 ENCOUNTER — Other Ambulatory Visit: Payer: Self-pay

## 2019-01-07 DIAGNOSIS — C9310 Chronic myelomonocytic leukemia not having achieved remission: Secondary | ICD-10-CM | POA: Diagnosis not present

## 2019-01-07 DIAGNOSIS — D649 Anemia, unspecified: Secondary | ICD-10-CM | POA: Diagnosis not present

## 2019-01-07 LAB — CBC WITH DIFFERENTIAL/PLATELET
Basophils Absolute: 0 10*3/uL (ref 0.0–0.1)
Basophils Relative: 0 %
Eosinophils Absolute: 0.4 10*3/uL (ref 0.0–0.5)
Eosinophils Relative: 1 %
HCT: 36.6 % — ABNORMAL LOW (ref 39.0–52.0)
Hemoglobin: 11.2 g/dL — ABNORMAL LOW (ref 13.0–17.0)
Immature Granulocytes: 0 %
Lymphocytes Relative: 32 %
Lymphs Abs: 1.8 10*3/uL (ref 0.7–4.0)
MCH: 25.6 pg — ABNORMAL LOW (ref 26.0–34.0)
MCHC: 30.6 g/dL (ref 30.0–36.0)
MCV: 83.6 fL (ref 80.0–100.0)
Monocytes Absolute: 0.5 10*3/uL (ref 0.1–1.0)
Monocytes Relative: 10 %
Neutro Abs: 2.9 10*3/uL (ref 1.7–7.7)
Neutrophils Relative %: 51 %
Platelets: 173 10*3/uL (ref 150–400)
RBC: 4.38 MIL/uL (ref 4.22–5.81)
RDW: 29.1 % — ABNORMAL HIGH (ref 11.5–15.5)
WBC: 5.7 10*3/uL (ref 4.0–10.5)
nRBC: 0 /100 WBC

## 2019-01-07 LAB — PROTIME-INR
INR: 1.1 (ref 0.8–1.2)
Prothrombin Time: 13.6 seconds (ref 11.4–15.2)

## 2019-01-07 MED ORDER — MIDAZOLAM HCL 5 MG/5ML IJ SOLN
INTRAMUSCULAR | Status: AC
  Filled 2019-01-07: qty 5

## 2019-01-07 MED ORDER — FENTANYL CITRATE (PF) 100 MCG/2ML IJ SOLN
INTRAMUSCULAR | Status: AC
  Filled 2019-01-07: qty 2

## 2019-01-07 MED ORDER — SODIUM CHLORIDE 0.9 % IV SOLN
INTRAVENOUS | Status: DC
  Administered 2019-01-07: 08:00:00 via INTRAVENOUS

## 2019-01-07 MED ORDER — FENTANYL CITRATE (PF) 100 MCG/2ML IJ SOLN
INTRAMUSCULAR | Status: AC | PRN
  Administered 2019-01-07: 50 ug via INTRAVENOUS

## 2019-01-07 MED ORDER — MIDAZOLAM HCL 5 MG/5ML IJ SOLN
INTRAMUSCULAR | Status: AC | PRN
  Administered 2019-01-07 (×2): 1 mg via INTRAVENOUS

## 2019-01-07 MED ORDER — HEPARIN SOD (PORK) LOCK FLUSH 100 UNIT/ML IV SOLN
INTRAVENOUS | Status: AC
  Filled 2019-01-07: qty 5

## 2019-01-07 NOTE — Procedures (Signed)
Interventional Radiology Procedure Note  Procedure: CT guided bone marrow aspiration and biopsy  Complications: None  EBL: < 10 mL  Findings: Aspirate and core biopsy performed of bone marrow in right iliac bone.  Plan: Bedrest supine x 1 hrs  Nicey Krah T. Coralynn Gaona, M.D Pager:  [phone]   

## 2019-01-08 LAB — SURGICAL PATHOLOGY

## 2019-01-14 ENCOUNTER — Other Ambulatory Visit: Payer: Self-pay | Admitting: Oncology

## 2019-01-14 ENCOUNTER — Encounter (HOSPITAL_COMMUNITY): Payer: Self-pay | Admitting: Oncology

## 2019-01-17 ENCOUNTER — Other Ambulatory Visit: Payer: Self-pay | Admitting: Oncology

## 2019-01-17 ENCOUNTER — Ambulatory Visit: Payer: Medicare Other | Admitting: Nurse Practitioner

## 2019-01-17 NOTE — Telephone Encounter (Signed)
...    Ref Range & Units 3wk ago  Potassium 3.5 - 5.1 mmol/L 4.0         

## 2019-01-20 ENCOUNTER — Inpatient Hospital Stay: Payer: Medicare Other

## 2019-01-20 ENCOUNTER — Inpatient Hospital Stay: Payer: Medicare Other | Attending: Oncology | Admitting: Oncology

## 2019-01-20 ENCOUNTER — Encounter: Payer: Self-pay | Admitting: Oncology

## 2019-01-20 ENCOUNTER — Other Ambulatory Visit: Payer: Self-pay

## 2019-01-20 VITALS — BP 125/54 | HR 90 | Temp 97.8°F | Ht 74.5 in | Wt 322.0 lb

## 2019-01-20 DIAGNOSIS — T451X5A Adverse effect of antineoplastic and immunosuppressive drugs, initial encounter: Secondary | ICD-10-CM | POA: Diagnosis not present

## 2019-01-20 DIAGNOSIS — Z5111 Encounter for antineoplastic chemotherapy: Secondary | ICD-10-CM | POA: Diagnosis not present

## 2019-01-20 DIAGNOSIS — D701 Agranulocytosis secondary to cancer chemotherapy: Secondary | ICD-10-CM | POA: Diagnosis not present

## 2019-01-20 DIAGNOSIS — C9310 Chronic myelomonocytic leukemia not having achieved remission: Secondary | ICD-10-CM | POA: Insufficient documentation

## 2019-01-20 LAB — CBC WITH DIFFERENTIAL/PLATELET
Abs Immature Granulocytes: 0 10*3/uL (ref 0.00–0.07)
Band Neutrophils: 1 %
Basophils Absolute: 0 10*3/uL (ref 0.0–0.1)
Basophils Relative: 1 %
Eosinophils Absolute: 0.3 10*3/uL (ref 0.0–0.5)
Eosinophils Relative: 11 %
HCT: 34.5 % — ABNORMAL LOW (ref 39.0–52.0)
Hemoglobin: 10.4 g/dL — ABNORMAL LOW (ref 13.0–17.0)
Lymphocytes Relative: 46 %
Lymphs Abs: 1.4 10*3/uL (ref 0.7–4.0)
MCH: 25.7 pg — ABNORMAL LOW (ref 26.0–34.0)
MCHC: 30.1 g/dL (ref 30.0–36.0)
MCV: 85.4 fL (ref 80.0–100.0)
Monocytes Absolute: 0 10*3/uL — ABNORMAL LOW (ref 0.1–1.0)
Monocytes Relative: 0 %
Neutro Abs: 1.3 10*3/uL — ABNORMAL LOW (ref 1.7–7.7)
Neutrophils Relative %: 41 %
Platelets: 724 10*3/uL — ABNORMAL HIGH (ref 150–400)
RBC: 4.04 MIL/uL — ABNORMAL LOW (ref 4.22–5.81)
RDW: 29.1 % — ABNORMAL HIGH (ref 11.5–15.5)
Smear Review: INCREASED
WBC: 3 10*3/uL — ABNORMAL LOW (ref 4.0–10.5)
nRBC: 0 % (ref 0.0–0.2)

## 2019-01-20 LAB — COMPREHENSIVE METABOLIC PANEL
ALT: 13 U/L (ref 0–44)
AST: 14 U/L — ABNORMAL LOW (ref 15–41)
Albumin: 4.1 g/dL (ref 3.5–5.0)
Alkaline Phosphatase: 101 U/L (ref 38–126)
Anion gap: 5 (ref 5–15)
BUN: 20 mg/dL (ref 8–23)
CO2: 25 mmol/L (ref 22–32)
Calcium: 9 mg/dL (ref 8.9–10.3)
Chloride: 105 mmol/L (ref 98–111)
Creatinine, Ser: 0.94 mg/dL (ref 0.61–1.24)
GFR calc Af Amer: >60 mL/min (ref >60)
GFR calc non Af Amer: >60 mL/min (ref >60)
Glucose, Bld: 109 mg/dL — ABNORMAL HIGH (ref 70–99)
Potassium: 4.2 mmol/L (ref 3.5–5.1)
Sodium: 135 mmol/L (ref 135–145)
Total Bilirubin: 0.7 mg/dL (ref 0.3–1.2)
Total Protein: 7.7 g/dL (ref 6.5–8.1)

## 2019-01-20 MED ORDER — AZACITIDINE CHEMO SQ INJECTION
50.0000 mg/m2 | Freq: Once | INTRAMUSCULAR | Status: AC
  Administered 2019-01-20: 12:00:00 132.5 mg via SUBCUTANEOUS
  Filled 2019-01-20: qty 5.3

## 2019-01-20 MED ORDER — ONDANSETRON HCL 4 MG PO TABS
8.0000 mg | ORAL_TABLET | Freq: Once | ORAL | Status: AC
  Administered 2019-01-20: 8 mg via ORAL
  Filled 2019-01-20: qty 2

## 2019-01-20 NOTE — Progress Notes (Signed)
ANC 1.3 today. Per Dr Janese Banks okay to proceed with treatment with dose reduced to 50mg  and holding 11/23 and 11/24 treatment

## 2019-01-20 NOTE — Progress Notes (Signed)
Patient stated that he had been doing well. 

## 2019-01-21 ENCOUNTER — Inpatient Hospital Stay: Payer: Medicare Other

## 2019-01-21 ENCOUNTER — Other Ambulatory Visit: Payer: Self-pay

## 2019-01-21 VITALS — BP 109/62 | HR 89 | Temp 98.0°F | Resp 20

## 2019-01-21 DIAGNOSIS — C9310 Chronic myelomonocytic leukemia not having achieved remission: Secondary | ICD-10-CM | POA: Diagnosis not present

## 2019-01-21 DIAGNOSIS — Z5111 Encounter for antineoplastic chemotherapy: Secondary | ICD-10-CM | POA: Diagnosis not present

## 2019-01-21 MED ORDER — AZACITIDINE CHEMO SQ INJECTION
50.0000 mg/m2 | Freq: Once | INTRAMUSCULAR | Status: AC
  Administered 2019-01-21: 132.5 mg via SUBCUTANEOUS
  Filled 2019-01-21: qty 5.3

## 2019-01-21 MED ORDER — ONDANSETRON HCL 4 MG PO TABS
8.0000 mg | ORAL_TABLET | Freq: Once | ORAL | Status: AC
  Administered 2019-01-21: 13:00:00 8 mg via ORAL
  Filled 2019-01-21: qty 2

## 2019-01-21 NOTE — Progress Notes (Signed)
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 He has recently been having bilateral knee pain and has undergone steroid injection in his knee.Patient was noted to have a high white count on his routine exam. His white count was 19.2 on 07/22/2018 with an H&H of 11.4/35.1 and a platelet count of 470. At that point he was prescribed a course of antibiotics and a repeat blood work on 08/21/2018 showed white count of 42.3, H&H of 7.1/23 with an MCV of 84 and a platelet count of 791 and hence the patient was referred to hematology for further management.  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 In addition the core biopsy showed a small focus of fibrosis containing scattering of mast cells and eosinophils most suggestive of systemic mastocytosis.bone marrow blasts 7%.Flow cytometry showed increased number of monocytic cells representing 16% of all cells with expression for HLA-DR, CD11b, CD13, CD14, CD33, CD38, CD56 and CD56 and CD64 with [facility] CD 117 are CD34.normal cytogenetics, CBL, SRSF2, SH2B3 and TET2  Repeat bone marrow biopsy after 4 cycles of Vidaza showed less pronounced monocytic/granulocytic proliferation and overall better granulopoiesis and no increase in blasts.  In addition features of systemic mastocytosis not present.  Interval history-patient currently feels well and denies any complaints at this time  ECOG  PS- 1 Pain scale- 0 Opioid associated constipation- no  Review of systems- Review of Systems  Constitutional: Negative for chills, fever, malaise/fatigue and weight loss.  HENT: Negative for congestion, ear discharge and nosebleeds.   Eyes: Negative for blurred vision.  Respiratory: Negative for cough, hemoptysis, sputum production, shortness of breath and wheezing.   Cardiovascular: Negative for chest pain, palpitations, orthopnea and claudication.  Gastrointestinal: Negative for abdominal pain, blood in stool, constipation, diarrhea, heartburn, melena, nausea and vomiting.  Genitourinary: Negative for dysuria, flank pain, frequency, hematuria and urgency.  Musculoskeletal: Negative for back pain, joint pain and myalgias.  Skin: Negative for rash.  Neurological: Negative for dizziness, tingling, focal weakness, seizures, weakness and headaches.  Endo/Heme/Allergies: Does not bruise/bleed easily.  Psychiatric/Behavioral: Negative for depression and suicidal ideas. The patient does not have insomnia.       No Known Allergies   Past Medical History:  Diagnosis Date  . Anemia   . 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 Hypertension      Past Surgical History:  Procedure Laterality Date  . COLONOSCOPY WITH PROPOFOL N/A 05/03/2015   Procedure: COLONOSCOPY WITH PROPOFOL;  Surgeon: [name], MD;  Location: [facility] ENDOSCOPY;  Service: Gastroenterology;  Laterality: N/A;  . ESOPHAGOGASTRODUODENOSCOPY (EGD) WITH PROPOFOL N/A 05/03/2015   Procedure: ESOPHAGOGASTRODUODENOSCOPY (EGD) WITH PROPOFOL;  Surgeon: [name], MD;  Location: [facility] ENDOSCOPY;  Service: Gastroenterology;  Laterality: N/A;  . HERNIA REPAIR     1975    Social History   Socioeconomic History  . Marital status: Divorced    Spouse name: Not on file  . Number of children: 4  . Years  of education: Not on file  . Highest education level: Not on file  Occupational History  . Not on file  Social Needs  .  Financial resource strain: Not hard at all  . Food insecurity    Worry: Never true    Inability: Never true  . Transportation needs    Medical: No    Non-medical: No  Tobacco Use  . Smoking status: Never Smoker  . Smokeless tobacco: Never Used  Substance and Sexual Activity  . Alcohol use: No  . Drug use: No  . Sexual activity: Not on file  Lifestyle  . Physical activity    Days per week: 3 days    Minutes per session: 150+ min  . Stress: Not at all  Relationships  . Social connections    Talks on phone: More than three times a week    Gets together: More than three times a week    Attends religious service: More than 4 times per year    Active member of club or organization: No    Attends meetings of clubs or organizations: Never    Relationship status: Divorced  . Intimate partner violence    Fear of current or ex partner: Not on file    Emotionally abused: Not on file    Physically abused: Not on file    Forced sexual activity: Not on file  Other Topics Concern  . Not on file  Social History Narrative  . Not on file    Family History  Problem Relation Age of Onset  . Alzheimer's disease Father   . Lung cancer Sister   . Cancer Sister      Current Outpatient Medications:  .  allopurinol (ZYLOPRIM) 300 MG tablet, Take 1 tablet (300 mg total) by mouth daily., Disp: 30 tablet, Rfl: 3 .  aspirin EC 81 MG tablet, Take 81 mg by mouth daily., Disp: , Rfl:  .  colchicine 0.6 MG tablet, Take 2 tablets (1.47m) by mouth at first sign of gout flare followed by 1 tablet (0.645m after 1 hour. (Max 1.7m21mithin 1 hour), Disp: , Rfl:  .  cyanocobalamin 500 MCG tablet, Take 500 mcg by mouth daily., Disp: , Rfl:  .  ibuprofen (ADVIL,MOTRIN) 200 MG tablet, Take 200 mg by mouth every 6 (six) hours as needed. , Disp: , Rfl:  .  KLOR-CON M20 20 MEQ tablet, TAKE 1 TABLET BY MOUTH TWICE A DAY, Disp: 60 tablet, Rfl: 0 .  ondansetron (ZOFRAN) 8 MG tablet, Take 1 tablet (8 mg total) by  mouth 2 (two) times daily as needed (Nausea or vomiting)., Disp: 30 tablet, Rfl: 1 .  pantoprazole (PROTONIX) 40 MG tablet, Take 1 tablet (40 mg total) by mouth daily., Disp: 90 tablet, Rfl: 1 .  prochlorperazine (COMPAZINE) 10 MG tablet, Take 1 tablet (10 mg total) by mouth every 6 (six) hours as needed (Nausea or vomiting)., Disp: 30 tablet, Rfl: 1 .  tiZANidine (ZANAFLEX) 4 MG tablet, Take 4 mg by mouth every 6 (six) hours as needed. , Disp: , Rfl:  .  valACYclovir (VALTREX) 500 MG tablet, TAKE 1 TABLET BY MOUTH EVERY DAY, Disp: 90 tablet, Rfl: 1 .  valsartan-hydrochlorothiazide (DIOVAN-HCT) 320-25 MG tablet, Take 1 tablet by mouth daily., Disp: 90 tablet, Rfl: 2  Physical exam:  Vitals:   01/20/19 1008  BP: (!) 125/54  Pulse: 90  Temp: 97.8 F (36.6 C)  TempSrc: Tympanic  Weight: (!) 322 lb (146.1 kg)  Height: 6' 2.5" (  1.892 m)   Physical Exam Constitutional:      General: He is not in acute distress. HENT:     Head: Normocephalic and atraumatic.  Eyes:     Pupils: Pupils are equal, round, and reactive to light.  Neck:     Musculoskeletal: Normal range of motion.  Cardiovascular:     Rate and Rhythm: Normal rate and regular rhythm.     Heart sounds: Normal heart sounds.  Pulmonary:     Effort: Pulmonary effort is normal.     Breath sounds: Normal breath sounds.  Abdominal:     General: Bowel sounds are normal.     Palpations: Abdomen is soft.  Skin:    General: Skin is warm and dry.  Neurological:     Mental Status: He is alert and oriented to person, place, and time.      CMP Latest Ref Rng & Units 01/20/2019  Glucose 70 - 99 mg/dL 109(H)  BUN 8 - 23 mg/dL 20  Creatinine 0.61 - 1.24 mg/dL 0.94  Sodium 135 - 145 mmol/L 135  Potassium 3.5 - 5.1 mmol/L 4.2  Chloride 98 - 111 mmol/L 105  CO2 22 - 32 mmol/L 25  Calcium 8.9 - 10.3 mg/dL 9.0  Total Protein 6.5 - 8.1 g/dL 7.7  Total Bilirubin 0.3 - 1.2 mg/dL 0.7  Alkaline Phos 38 - 126 U/L 101  AST 15 - 41 U/L  14(L)  ALT 0 - 44 U/L 13   CBC Latest Ref Rng & Units 01/20/2019  WBC 4.0 - 10.5 K/uL 3.0(L)  Hemoglobin 13.0 - 17.0 g/dL 10.4(L)  Hematocrit 39.0 - 52.0 % 34.5(L)  Platelets 150 - 400 K/uL 724(H)    No images are attached to the encounter.  Ct Bone Marrow Biopsy & Aspiration  Result Date: 01/07/2019 CLINICAL DATA:  History of treated chronic myelomonocytic leukemia. Bone marrow biopsy performed to assess treatment response. EXAM: CT GUIDED BONE MARROW ASPIRATION AND BIOPSY ANESTHESIA/SEDATION: Versed 2.0 mg IV, Fentanyl 50 mcg IV Total Moderate Sedation Time:   23 minutes. The patient's level of consciousness and physiologic status were continuously monitored during the procedure by Radiology nursing. PROCEDURE: The procedure risks, benefits, and alternatives were explained to the patient. Questions regarding the procedure were encouraged and answered. The patient understands and consents to the procedure. A time out was performed prior to initiating the procedure. The right gluteal region was prepped with chlorhexidine. Sterile gown and sterile gloves were used for the procedure. Local anesthesia was provided with 1% Lidocaine. Under CT guidance, an 11 gauge On Control bone cutting needle was advanced from a posterior approach into the right iliac bone. Needle positioning was confirmed with CT. Initial non heparinized and heparinized aspirate samples were obtained of bone marrow. Core biopsy was performed via the On Control drill needle. COMPLICATIONS: None FINDINGS: Inspection of initial aspirate did reveal visible particles. Intact core biopsy sample was obtained. IMPRESSION: CT guided bone marrow biopsy of right posterior iliac bone with both aspirate and core samples obtained. Electronically Signed   By: Aletta Edouard M.D.   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  Male here for on treatment assessment prior to cycle 5-day 1 of Vidaza  Patient  has mild leukopenia with a white count of 3 today and an ANC of 1.3.Hemoglobin stable around 10.  His platelet counts do tend to go up right before his chemotherapy and normalize posttreatment.  I  will reduce the dose of his Vidaza to 50 mg per metered square per day and also eliminate day 6 from day 7 of treatment with cycle.  Repeat CBC with differential in 2 weeks time  I will see him back in 4 weeks with CBC with differential, CMP, LDH and uric acid for cycle 6-day 1 of Vidaza.  Also discussed the results of the bone marrow biopsy with the patient in detail which shows residual CMML but no increase in blasts and less  pronounced granulocytic proliferation and dysgranulopoiesis indicating continued response to treatment   Visit Diagnosis 1. Chemotherapy induced neutropenia (HCC)   2. Encounter for antineoplastic chemotherapy   3. 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 Randa Evens, MD, MPH [facility] [place] at [facility] [phone] 01/21/2019 8:36 AM

## 2019-01-22 ENCOUNTER — Other Ambulatory Visit: Payer: Self-pay

## 2019-01-22 ENCOUNTER — Inpatient Hospital Stay: Payer: Medicare Other

## 2019-01-22 VITALS — BP 102/71 | HR 96 | Temp 97.5°F | Resp 18 | Wt 323.0 lb

## 2019-01-22 DIAGNOSIS — C9310 Chronic myelomonocytic leukemia not having achieved remission: Secondary | ICD-10-CM | POA: Diagnosis not present

## 2019-01-22 DIAGNOSIS — Z5111 Encounter for antineoplastic chemotherapy: Secondary | ICD-10-CM | POA: Diagnosis not present

## 2019-01-22 MED ORDER — AZACITIDINE CHEMO SQ INJECTION
50.0000 mg/m2 | Freq: Once | INTRAMUSCULAR | Status: AC
  Administered 2019-01-22: 132.5 mg via SUBCUTANEOUS
  Filled 2019-01-22: qty 5.3

## 2019-01-22 MED ORDER — ONDANSETRON HCL 4 MG PO TABS
8.0000 mg | ORAL_TABLET | Freq: Once | ORAL | Status: AC
  Administered 2019-01-22: 8 mg via ORAL
  Filled 2019-01-22: qty 2

## 2019-01-23 ENCOUNTER — Other Ambulatory Visit: Payer: Self-pay

## 2019-01-23 ENCOUNTER — Inpatient Hospital Stay: Payer: Medicare Other

## 2019-01-23 VITALS — BP 108/61 | HR 100 | Temp 98.5°F | Resp 18

## 2019-01-23 DIAGNOSIS — C9310 Chronic myelomonocytic leukemia not having achieved remission: Secondary | ICD-10-CM | POA: Diagnosis not present

## 2019-01-23 DIAGNOSIS — Z5111 Encounter for antineoplastic chemotherapy: Secondary | ICD-10-CM | POA: Diagnosis not present

## 2019-01-23 MED ORDER — AZACITIDINE CHEMO SQ INJECTION
50.0000 mg/m2 | Freq: Once | INTRAMUSCULAR | Status: AC
  Administered 2019-01-23: 132.5 mg via SUBCUTANEOUS
  Filled 2019-01-23: qty 5.3

## 2019-01-23 MED ORDER — ONDANSETRON HCL 4 MG PO TABS
8.0000 mg | ORAL_TABLET | Freq: Once | ORAL | Status: AC
  Administered 2019-01-23: 8 mg via ORAL
  Filled 2019-01-23: qty 2

## 2019-01-24 ENCOUNTER — Other Ambulatory Visit: Payer: Self-pay

## 2019-01-24 ENCOUNTER — Inpatient Hospital Stay: Payer: Medicare Other

## 2019-01-24 VITALS — BP 116/51 | HR 100 | Temp 98.4°F | Resp 18

## 2019-01-24 DIAGNOSIS — C9310 Chronic myelomonocytic leukemia not having achieved remission: Secondary | ICD-10-CM | POA: Diagnosis not present

## 2019-01-24 DIAGNOSIS — Z5111 Encounter for antineoplastic chemotherapy: Secondary | ICD-10-CM | POA: Diagnosis not present

## 2019-01-24 MED ORDER — AZACITIDINE CHEMO SQ INJECTION
50.0000 mg/m2 | Freq: Once | INTRAMUSCULAR | Status: AC
  Administered 2019-01-24: 132.5 mg via SUBCUTANEOUS
  Filled 2019-01-24: qty 5.3

## 2019-01-24 MED ORDER — ONDANSETRON HCL 4 MG PO TABS
8.0000 mg | ORAL_TABLET | Freq: Once | ORAL | Status: AC
  Administered 2019-01-24: 8 mg via ORAL
  Filled 2019-01-24: qty 2

## 2019-01-27 ENCOUNTER — Other Ambulatory Visit: Payer: Medicare Other

## 2019-01-27 ENCOUNTER — Ambulatory Visit: Payer: Medicare Other

## 2019-01-28 ENCOUNTER — Ambulatory Visit: Payer: Medicare Other

## 2019-02-03 ENCOUNTER — Other Ambulatory Visit: Payer: Self-pay

## 2019-02-03 ENCOUNTER — Inpatient Hospital Stay: Payer: Medicare Other

## 2019-02-03 DIAGNOSIS — Z5111 Encounter for antineoplastic chemotherapy: Secondary | ICD-10-CM | POA: Diagnosis not present

## 2019-02-03 DIAGNOSIS — C9310 Chronic myelomonocytic leukemia not having achieved remission: Secondary | ICD-10-CM

## 2019-02-03 LAB — CBC WITH DIFFERENTIAL/PLATELET
Abs Immature Granulocytes: 0.07 10*3/uL (ref 0.00–0.07)
Basophils Absolute: 0 10*3/uL (ref 0.0–0.1)
Basophils Relative: 1 %
Eosinophils Absolute: 0.1 10*3/uL (ref 0.0–0.5)
Eosinophils Relative: 1 %
HCT: 37.7 % — ABNORMAL LOW (ref 39.0–52.0)
Hemoglobin: 11.3 g/dL — ABNORMAL LOW (ref 13.0–17.0)
Immature Granulocytes: 1 %
Lymphocytes Relative: 41 %
Lymphs Abs: 2.2 10*3/uL (ref 0.7–4.0)
MCH: 26.6 pg (ref 26.0–34.0)
MCHC: 30 g/dL (ref 30.0–36.0)
MCV: 88.7 fL (ref 80.0–100.0)
Monocytes Absolute: 0.6 10*3/uL (ref 0.1–1.0)
Monocytes Relative: 12 %
Neutro Abs: 2.5 10*3/uL (ref 1.7–7.7)
Neutrophils Relative %: 44 %
Platelets: 133 10*3/uL — ABNORMAL LOW (ref 150–400)
RBC: 4.25 MIL/uL (ref 4.22–5.81)
RDW: 28.4 % — ABNORMAL HIGH (ref 11.5–15.5)
WBC: 5.5 10*3/uL (ref 4.0–10.5)
nRBC: 1.1 % — ABNORMAL HIGH (ref 0.0–0.2)

## 2019-02-08 ENCOUNTER — Other Ambulatory Visit: Payer: Self-pay | Admitting: Oncology

## 2019-02-17 ENCOUNTER — Other Ambulatory Visit: Payer: Self-pay

## 2019-02-17 ENCOUNTER — Inpatient Hospital Stay: Payer: Medicare Other

## 2019-02-17 ENCOUNTER — Inpatient Hospital Stay: Payer: Medicare Other | Attending: Oncology | Admitting: Oncology

## 2019-02-17 ENCOUNTER — Encounter: Payer: Self-pay | Admitting: Oncology

## 2019-02-17 VITALS — BP 127/66 | HR 84 | Temp 97.8°F | Wt 324.0 lb

## 2019-02-17 DIAGNOSIS — C9310 Chronic myelomonocytic leukemia not having achieved remission: Secondary | ICD-10-CM

## 2019-02-17 DIAGNOSIS — Z5111 Encounter for antineoplastic chemotherapy: Secondary | ICD-10-CM

## 2019-02-17 LAB — CBC WITH DIFFERENTIAL/PLATELET
Abs Immature Granulocytes: 0 10*3/uL (ref 0.00–0.07)
Basophils Absolute: 0 10*3/uL (ref 0.0–0.1)
Basophils Relative: 1 %
Eosinophils Absolute: 0.1 10*3/uL (ref 0.0–0.5)
Eosinophils Relative: 3 %
HCT: 38.5 % — ABNORMAL LOW (ref 39.0–52.0)
Hemoglobin: 11.6 g/dL — ABNORMAL LOW (ref 13.0–17.0)
Lymphocytes Relative: 43 %
Lymphs Abs: 1.7 10*3/uL (ref 0.7–4.0)
MCH: 26.7 pg (ref 26.0–34.0)
MCHC: 30.1 g/dL (ref 30.0–36.0)
MCV: 88.7 fL (ref 80.0–100.0)
Monocytes Absolute: 0.5 10*3/uL (ref 0.1–1.0)
Monocytes Relative: 12 %
Neutro Abs: 1.6 10*3/uL — ABNORMAL LOW (ref 1.7–7.7)
Neutrophils Relative %: 41 %
Platelets: 588 10*3/uL — ABNORMAL HIGH (ref 150–400)
RBC: 4.34 MIL/uL (ref 4.22–5.81)
RDW: 27.1 % — ABNORMAL HIGH (ref 11.5–15.5)
Smear Review: INCREASED
WBC Morphology: ABNORMAL
WBC: 4 10*3/uL (ref 4.0–10.5)
nRBC: 0.5 % — ABNORMAL HIGH (ref 0.0–0.2)

## 2019-02-17 LAB — BASIC METABOLIC PANEL
Anion gap: 5 (ref 5–15)
BUN: 19 mg/dL (ref 8–23)
CO2: 28 mmol/L (ref 22–32)
Calcium: 9.3 mg/dL (ref 8.9–10.3)
Chloride: 105 mmol/L (ref 98–111)
Creatinine, Ser: 0.87 mg/dL (ref 0.61–1.24)
GFR calc Af Amer: >60 mL/min (ref >60)
GFR calc non Af Amer: >60 mL/min (ref >60)
Glucose, Bld: 104 mg/dL — ABNORMAL HIGH (ref 70–99)
Potassium: 4.3 mmol/L (ref 3.5–5.1)
Sodium: 138 mmol/L (ref 135–145)

## 2019-02-17 MED ORDER — ONDANSETRON HCL 4 MG PO TABS
8.0000 mg | ORAL_TABLET | Freq: Once | ORAL | Status: AC
  Administered 2019-02-17: 8 mg via ORAL
  Filled 2019-02-17: qty 2

## 2019-02-17 MED ORDER — AZACITIDINE CHEMO SQ INJECTION
50.0000 mg/m2 | Freq: Once | INTRAMUSCULAR | Status: AC
  Administered 2019-02-17: 132.5 mg via SUBCUTANEOUS
  Filled 2019-02-17: qty 5.3

## 2019-02-17 NOTE — Progress Notes (Signed)
Patient stated that he had been doing well. 

## 2019-02-18 ENCOUNTER — Other Ambulatory Visit: Payer: Self-pay

## 2019-02-18 ENCOUNTER — Inpatient Hospital Stay: Payer: Medicare Other

## 2019-02-18 VITALS — BP 120/78 | HR 98 | Temp 97.8°F | Resp 18

## 2019-02-18 DIAGNOSIS — C9310 Chronic myelomonocytic leukemia not having achieved remission: Secondary | ICD-10-CM | POA: Diagnosis not present

## 2019-02-18 DIAGNOSIS — Z5111 Encounter for antineoplastic chemotherapy: Secondary | ICD-10-CM | POA: Diagnosis not present

## 2019-02-18 MED ORDER — ONDANSETRON HCL 4 MG PO TABS
8.0000 mg | ORAL_TABLET | Freq: Once | ORAL | Status: AC
  Administered 2019-02-18: 8 mg via ORAL
  Filled 2019-02-18: qty 2

## 2019-02-18 MED ORDER — AZACITIDINE CHEMO SQ INJECTION
50.0000 mg/m2 | Freq: Once | INTRAMUSCULAR | Status: AC
  Administered 2019-02-18: 132.5 mg via SUBCUTANEOUS
  Filled 2019-02-18: qty 5.3

## 2019-02-18 NOTE — Progress Notes (Signed)
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 He has recently been having bilateral knee pain and has undergone steroid injection in his knee.Patient was noted to have a high white count on his routine exam. His white count was 19.2 on 07/22/2018 with an H&H of 11.4/35.1 and a platelet count of 470. At that point he was prescribed a course of antibiotics and a repeat blood work on 08/21/2018 showed white count of 42.3, H&H of 7.1/23 with an MCV of 84 and a platelet count of 791 and hence the patient was referred to hematology for further management.  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 In addition the core biopsy showed a small focus of fibrosis containing scattering of mast cells and eosinophils most suggestive of systemic mastocytosis.bone marrow blasts 7%.Flow cytometry showed increased number of monocytic cells representing 16% of all cells with expression for HLA-DR, CD11b, CD13, CD14, CD33, CD38, CD56 and CD56 and CD64 with [facility] CD 117 are CD34.normal cytogenetics, CBL, SRSF2, SH2B3 and TET2  Repeat bone marrow biopsy after 4 cycles of Vidaza showed less pronounced monocytic/granulocytic proliferation and overall better granulopoiesis and no increase in blasts.  In addition features of systemic mastocytosis not present.   Interval history- he is doing well. Denies any complaints at this time.   ECOG PS- 1  Pain scale- 0   Review of systems- Review of Systems  Constitutional: Negative for chills, fever, malaise/fatigue and weight loss.  HENT: Negative for congestion, ear discharge and nosebleeds.   Eyes: Negative for blurred vision.  Respiratory: Negative for cough, hemoptysis, sputum production, shortness of breath and wheezing.   Cardiovascular: Negative for chest pain, palpitations, orthopnea and claudication.  Gastrointestinal: Negative for abdominal pain, blood in stool, constipation, diarrhea, heartburn, melena, nausea and vomiting.  Genitourinary: Negative for dysuria, flank pain, frequency, hematuria and urgency.  Musculoskeletal: Negative for back pain, joint pain and myalgias.  Skin: Negative for rash.  Neurological: Negative for dizziness, tingling, focal weakness, seizures, weakness and headaches.  Endo/Heme/Allergies: Does not bruise/bleed easily.  Psychiatric/Behavioral: Negative for depression and suicidal ideas. The patient does not have insomnia.       No Known Allergies   Past Medical History:  Diagnosis Date  . Anemia   . CMML (chronic myelomonocytic leukemia) ([place])   . Hypertension      Past Surgical History:  Procedure Laterality Date  . COLONOSCOPY WITH PROPOFOL N/A 05/03/2015   Procedure: COLONOSCOPY WITH PROPOFOL;  Surgeon: [name], MD;  Location: [facility] ENDOSCOPY;  Service: Gastroenterology;  Laterality: N/A;  . ESOPHAGOGASTRODUODENOSCOPY (EGD) WITH PROPOFOL N/A 05/03/2015   Procedure: ESOPHAGOGASTRODUODENOSCOPY (EGD) WITH PROPOFOL;  Surgeon: [name], MD;  Location: [facility] ENDOSCOPY;  Service: Gastroenterology;  Laterality: N/A;  . HERNIA REPAIR     1975    Social History   Socioeconomic History  . Marital status: Divorced    Spouse name: Not on file  . Number of children: 4  . Years of  education: Not on file  . Highest education level: Not on file  Occupational History  . Not on file  Tobacco Use  . Smoking status: Never Smoker  . Smokeless  tobacco: Never Used  Substance and Sexual Activity  . Alcohol use: No  . Drug use: No  . Sexual activity: Not on file  Other Topics Concern  . Not on file  Social History Narrative  . Not on file   Social Determinants of Health   Financial Resource Strain: Low Risk   . Difficulty of Paying Living Expenses: Not hard at all  Food Insecurity: No Food Insecurity  . Worried About Charity fundraiser in the Last Year: Never true  . Ran Out of Food in the Last Year: Never true  Transportation Needs: No Transportation Needs  . Lack of Transportation (Medical): No  . Lack of Transportation (Non-Medical): No  Physical Activity: Sufficiently Active  . Days of Exercise per Week: 3 days  . Minutes of Exercise per Session: 150+ min  Stress: No Stress Concern Present  . Feeling of Stress : Not at all  Social Connections: Somewhat Isolated  . Frequency of Communication with Friends and Family: More than three times a week  . Frequency of Social Gatherings with Friends and Family: More than three times a week  . Attends Religious Services: More than 4 times per year  . Active Member of Clubs or Organizations: No  . Attends Archivist Meetings: Never  . Marital Status: Divorced  Human resources officer Violence:   . Fear of Current or Ex-Partner: Not on file  . Emotionally Abused: Not on file  . Physically Abused: Not on file  . Sexually Abused: Not on file    Family History  Problem Relation Age of Onset  . Alzheimer's disease Father   . Lung cancer Sister   . Cancer Sister      Current Outpatient Medications:  .  allopurinol (ZYLOPRIM) 300 MG tablet, Take 1 tablet (300 mg total) by mouth daily., Disp: 30 tablet, Rfl: 3 .  aspirin EC 81 MG tablet, Take 81 mg by mouth daily., Disp: , Rfl:  .  colchicine 0.6 MG tablet, Take 2 tablets (1.73m) by mouth at first sign of gout flare followed by 1 tablet (0.635m after 1 hour. (Max 1.73m80mithin 1 hour), Disp: , Rfl:  .  cyanocobalamin 500  MCG tablet, Take 500 mcg by mouth daily., Disp: , Rfl:  .  ibuprofen (ADVIL,MOTRIN) 200 MG tablet, Take 200 mg by mouth every 6 (six) hours as needed. , Disp: , Rfl:  .  KLOR-CON M20 20 MEQ tablet, TAKE 1 TABLET BY MOUTH TWICE A DAY, Disp: 60 tablet, Rfl: 0 .  ondansetron (ZOFRAN) 8 MG tablet, Take 1 tablet (8 mg total) by mouth 2 (two) times daily as needed (Nausea or vomiting)., Disp: 30 tablet, Rfl: 1 .  pantoprazole (PROTONIX) 40 MG tablet, Take 1 tablet (40 mg total) by mouth daily., Disp: 90 tablet, Rfl: 1 .  prochlorperazine (COMPAZINE) 10 MG tablet, Take 1 tablet (10 mg total) by mouth every 6 (six) hours as needed (Nausea or vomiting)., Disp: 30 tablet, Rfl: 1 .  tiZANidine (ZANAFLEX) 4 MG tablet, Take 4 mg by mouth every 6 (six) hours as needed. , Disp: , Rfl:  .  valACYclovir (VALTREX) 500 MG tablet, TAKE 1 TABLET BY MOUTH EVERY DAY, Disp: 90 tablet, Rfl: 1 .  valsartan-hydrochlorothiazide (DIOVAN-HCT) 320-25 MG tablet, Take 1 tablet by mouth daily., Disp:  90 tablet, Rfl: 2  Physical exam:  Vitals:   02/17/19 1024  BP: 127/66  Pulse: 84  Temp: 97.8 F (36.6 C)  TempSrc: Tympanic  Weight: (!) 324 lb (147 kg)   Physical Exam HENT:     Head: Normocephalic and atraumatic.  Eyes:     Pupils: Pupils are equal, round, and reactive to light.  Cardiovascular:     Rate and Rhythm: Normal rate and regular rhythm.     Heart sounds: Normal heart sounds.  Pulmonary:     Effort: Pulmonary effort is normal.     Breath sounds: Normal breath sounds.  Abdominal:     General: Bowel sounds are normal.     Palpations: Abdomen is soft.  Musculoskeletal:     Cervical back: Normal range of motion.  Skin:    General: Skin is warm and dry.  Neurological:     Mental Status: He is alert and oriented to person, place, and time.      CMP Latest Ref Rng & Units 02/17/2019  Glucose 70 - 99 mg/dL 104(H)  BUN 8 - 23 mg/dL 19  Creatinine 0.61 - 1.24 mg/dL 0.87  Sodium 135 - 145 mmol/L 138   Potassium 3.5 - 5.1 mmol/L 4.3  Chloride 98 - 111 mmol/L 105  CO2 22 - 32 mmol/L 28  Calcium 8.9 - 10.3 mg/dL 9.3  Total Protein 6.5 - 8.1 g/dL -  Total Bilirubin 0.3 - 1.2 mg/dL -  Alkaline Phos 38 - 126 U/L -  AST 15 - 41 U/L -  ALT 0 - 44 U/L -   CBC Latest Ref Rng & Units 02/17/2019  WBC 4.0 - 10.5 K/uL 4.0  Hemoglobin 13.0 - 17.0 g/dL 11.6(L)  Hematocrit 39.0 - 52.0 % 38.5(L)  Platelets 150 - 400 K/uL 588(H)      Assessment and plan- Patient is a 78 y.o. male with CMML 1 here for on treatment assessment prior to cycle 6-day 1 of Vidaza  Patient's white cell count is 4 with an ANC of 1.6.  Hemoglobin and platelet counts are stable.  His platelet counts tend to run high on day 1 of Vidaza but normalize after 2 weeks.  He is to continue dose reduced Vidaza for 5 days instead of 7 days every month.  He will need to continue this until progression or toxicity.  Patient will continue allopurinol and Valtrex prophylaxis at this time  I will see him back in 4 weeks with CBC with differential, CMP for cycle 7-day 1 of Vidaza   Visit Diagnosis 1. Encounter for antineoplastic chemotherapy   2. 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 Randa Evens, MD, MPH [facility] at [facility] [phone] 02/18/2019 8:19 AM

## 2019-02-19 ENCOUNTER — Telehealth: Payer: Self-pay

## 2019-02-19 ENCOUNTER — Inpatient Hospital Stay: Payer: Medicare Other

## 2019-02-19 ENCOUNTER — Other Ambulatory Visit: Payer: Self-pay

## 2019-02-19 VITALS — BP 92/61 | HR 94 | Temp 97.4°F | Resp 20

## 2019-02-19 DIAGNOSIS — Z5111 Encounter for antineoplastic chemotherapy: Secondary | ICD-10-CM | POA: Diagnosis not present

## 2019-02-19 DIAGNOSIS — C9310 Chronic myelomonocytic leukemia not having achieved remission: Secondary | ICD-10-CM

## 2019-02-19 MED ORDER — AZACITIDINE CHEMO SQ INJECTION
50.0000 mg/m2 | Freq: Once | INTRAMUSCULAR | Status: AC
  Administered 2019-02-19: 132.5 mg via SUBCUTANEOUS
  Filled 2019-02-19: qty 5.3

## 2019-02-19 MED ORDER — ONDANSETRON HCL 4 MG PO TABS
8.0000 mg | ORAL_TABLET | Freq: Once | ORAL | Status: AC
  Administered 2019-02-19: 8 mg via ORAL
  Filled 2019-02-19: qty 2

## 2019-02-19 NOTE — Telephone Encounter (Signed)
LMOM TO CONFIRM 02-21-19 OV AS A VIRTUAL VISIT.

## 2019-02-19 NOTE — Telephone Encounter (Signed)
Confirmed appointment with patient. klh °

## 2019-02-20 ENCOUNTER — Other Ambulatory Visit: Payer: Self-pay

## 2019-02-20 ENCOUNTER — Inpatient Hospital Stay: Payer: Medicare Other

## 2019-02-20 ENCOUNTER — Other Ambulatory Visit: Payer: Self-pay | Admitting: Oncology

## 2019-02-20 VITALS — BP 99/57 | HR 88 | Temp 98.2°F | Resp 18

## 2019-02-20 DIAGNOSIS — C9310 Chronic myelomonocytic leukemia not having achieved remission: Secondary | ICD-10-CM | POA: Diagnosis not present

## 2019-02-20 DIAGNOSIS — Z5111 Encounter for antineoplastic chemotherapy: Secondary | ICD-10-CM | POA: Diagnosis not present

## 2019-02-20 MED ORDER — AZACITIDINE CHEMO SQ INJECTION
50.0000 mg/m2 | Freq: Once | INTRAMUSCULAR | Status: AC
  Administered 2019-02-20: 132.5 mg via SUBCUTANEOUS
  Filled 2019-02-20: qty 5.3

## 2019-02-20 MED ORDER — ONDANSETRON HCL 4 MG PO TABS
8.0000 mg | ORAL_TABLET | Freq: Once | ORAL | Status: AC
  Administered 2019-02-20: 8 mg via ORAL
  Filled 2019-02-20: qty 2

## 2019-02-21 ENCOUNTER — Inpatient Hospital Stay: Payer: Medicare Other

## 2019-02-21 ENCOUNTER — Other Ambulatory Visit: Payer: Self-pay

## 2019-02-21 ENCOUNTER — Encounter: Payer: Self-pay | Admitting: Nurse Practitioner

## 2019-02-21 ENCOUNTER — Ambulatory Visit (INDEPENDENT_AMBULATORY_CARE_PROVIDER_SITE_OTHER): Payer: Medicare Other | Admitting: Nurse Practitioner

## 2019-02-21 VITALS — BP 94/63 | HR 93 | Temp 99.0°F | Resp 18

## 2019-02-21 DIAGNOSIS — I10 Essential (primary) hypertension: Secondary | ICD-10-CM | POA: Diagnosis not present

## 2019-02-21 DIAGNOSIS — C9310 Chronic myelomonocytic leukemia not having achieved remission: Secondary | ICD-10-CM

## 2019-02-21 DIAGNOSIS — K219 Gastro-esophageal reflux disease without esophagitis: Secondary | ICD-10-CM

## 2019-02-21 DIAGNOSIS — Z5111 Encounter for antineoplastic chemotherapy: Secondary | ICD-10-CM | POA: Diagnosis not present

## 2019-02-21 MED ORDER — AZACITIDINE CHEMO SQ INJECTION
50.0000 mg/m2 | Freq: Once | INTRAMUSCULAR | Status: AC
  Administered 2019-02-21: 132.5 mg via SUBCUTANEOUS
  Filled 2019-02-21: qty 5.3

## 2019-02-21 MED ORDER — ONDANSETRON HCL 4 MG PO TABS
8.0000 mg | ORAL_TABLET | Freq: Once | ORAL | Status: AC
  Administered 2019-02-21: 8 mg via ORAL
  Filled 2019-02-21: qty 2

## 2019-02-21 MED ORDER — PANTOPRAZOLE SODIUM 40 MG PO TBEC: 40.0000 mg | DELAYED_RELEASE_TABLET | Freq: Every day | ORAL | 1 refills | Status: DC

## 2019-02-21 NOTE — Progress Notes (Signed)
[facility] [place], [place] [address]  Internal MEDICINE  Telephone Visit  Patient Name: Samuel Frank  [id number]  PA:[id number]  Date of Service: 02/21/2019  I connected with the patient at 12:20pm by telephone and verified the patients identity using two identifiers.   I discussed the limitations, risks, security and privacy concerns of performing an evaluation and management service by telephone and the availability of in person appointments. I also discussed with the patient that there may be a patient responsible charge related to the service.  The patient expressed understanding and agrees to proceed.    Chief Complaint  Patient presents with  . Telephone Screen  . Telephone Assessment  . Hypertension    The patient has been contacted via telephone for follow up visit due to concerns for spread of novel coronavirus. The patient presents for follow up visit. He states that he is doing well and blood pressure is well managed. Last blood pressure check on 02/17/2019 was 127/66. He is on his way to chemotherapy. Is being treated at the [facility] for leukemia. His chemotherapy treatments have been cut down to five treatments a month, getting three shots in the abdomen. He states he is tolerating all treatments well. He has no new concerns or complaints today.       Current Medication: Outpatient Encounter Medications as of 02/21/2019  Medication Sig  . allopurinol (ZYLOPRIM) 300 MG tablet TAKE 1 TABLET BY MOUTH EVERY DAY  . aspirin EC 81 MG tablet Take 81 mg by mouth daily.  . colchicine 0.6 MG tablet Take 2 tablets (1.2mg ) by mouth at first sign of gout flare followed by 1 tablet (0.6mg ) after 1 hour. (Max 1.8mg  within 1 hour)  . cyanocobalamin 500 MCG tablet Take 500 mcg by mouth daily.  [name] ibuprofen (ADVIL,MOTRIN) 200 MG tablet Take 200 mg by mouth every 6 (six) hours as needed.   [name] KLOR-CON M20 20 MEQ tablet TAKE 1 TABLET BY MOUTH TWICE A DAY   . ondansetron (ZOFRAN) 8 MG tablet Take 1 tablet (8 mg total) by mouth 2 (two) times daily as needed (Nausea or vomiting).  . pantoprazole (PROTONIX) 40 MG tablet Take 1 tablet (40 mg total) by mouth daily.  . prochlorperazine (COMPAZINE) 10 MG tablet Take 1 tablet (10 mg total) by mouth every 6 (six) hours as needed (Nausea or vomiting).  [name] tiZANidine (ZANAFLEX) 4 MG tablet Take 4 mg by mouth every 6 (six) hours as needed.   . valACYclovir (VALTREX) 500 MG tablet TAKE 1 TABLET BY MOUTH EVERY DAY  . valsartan-hydrochlorothiazide (DIOVAN-HCT) 320-25 MG tablet Take 1 tablet by mouth daily.   No facility-administered encounter medications on file as of 02/21/2019.    Surgical History: Past Surgical History:  Procedure Laterality Date  . COLONOSCOPY WITH PROPOFOL N/A 05/03/2015   Procedure: COLONOSCOPY WITH PROPOFOL;  Surgeon: [name], MD;  Location: [facility] ENDOSCOPY;  Service: Gastroenterology;  Laterality: N/A;  . ESOPHAGOGASTRODUODENOSCOPY (EGD) WITH PROPOFOL N/A 05/03/2015   Procedure: ESOPHAGOGASTRODUODENOSCOPY (EGD) WITH PROPOFOL;  Surgeon: [name], MD;  Location: [facility] ENDOSCOPY;  Service: Gastroenterology;  Laterality: N/A;  . HERNIA REPAIR     1975    Medical History: Past Medical History:  Diagnosis Date  . Anemia   . CMML (chronic myelomonocytic leukemia) ([place])   . Hypertension     Family History: Family History  Problem Relation Age of Onset  . Alzheimer's disease Father   . Lung cancer Sister   .  Cancer Sister     Social History   Socioeconomic History  . Marital status: Divorced    Spouse name: Not on file  . Number of children: 4  . Years of education: Not on file  . Highest education level: Not on file  Occupational History  . Not on file  Tobacco Use  . Smoking status: Never Smoker  . Smokeless tobacco: Never Used  Substance and Sexual Activity  . Alcohol use: No  . Drug use: No  . Sexual activity: Not on file  Other Topics Concern  . Not on file   Social History Narrative  . Not on file   Social Determinants of Health   Financial Resource Strain: Low Risk   . Difficulty of Paying Living Expenses: Not hard at all  Food Insecurity: No Food Insecurity  . Worried About Charity fundraiser in the Last Year: Never true  . Ran Out of Food in the Last Year: Never true  Transportation Needs: No Transportation Needs  . Lack of Transportation (Medical): No  . Lack of Transportation (Non-Medical): No  Physical Activity: Sufficiently Active  . Days of Exercise per Week: 3 days  . Minutes of Exercise per Session: 150+ min  Stress: No Stress Concern Present  . Feeling of Stress : Not at all  Social Connections: Somewhat Isolated  . Frequency of Communication with Friends and Family: More than three times a week  . Frequency of Social Gatherings with Friends and Family: More than three times a week  . Attends Religious Services: More than 4 times per year  . Active Member of Clubs or Organizations: No  . Attends Archivist Meetings: Never  . Marital Status: Divorced  Human resources officer Violence:   . Fear of Current or Ex-Partner: Not on file  . Emotionally Abused: Not on file  . Physically Abused: Not on file  . Sexually Abused: Not on file      Review of Systems  Constitutional: Negative for activity change, chills, fatigue and unexpected weight change.  HENT: Negative for congestion, postnasal drip, rhinorrhea, sneezing and sore throat.   Respiratory: Negative for cough, chest tightness, shortness of breath and wheezing.   Cardiovascular: Negative for chest pain and palpitations.       States that his blood pressure is doing well.   Gastrointestinal: Negative for abdominal pain, constipation, diarrhea, nausea and vomiting.  Endocrine: Negative for cold intolerance, heat intolerance, polydipsia and polyuria.  Musculoskeletal: Negative for arthralgias, back pain, joint swelling and neck pain.  Skin: Negative for rash.   Allergic/Immunologic: Negative for environmental allergies.  Neurological: Negative for dizziness, tremors, numbness and headaches.  Hematological: Negative for adenopathy. Does not bruise/bleed easily.  Psychiatric/Behavioral: Negative for behavioral problems (Depression), sleep disturbance and suicidal ideas. The patient is not nervous/anxious.     Vital Signs: There were no vitals taken for this visit.   Observation/Objective:  The patient is alert and oriented. He is pleasant and answering all questions appropriately. Breathing is non-labored. He is in no acute distress.    Assessment/Plan: 1. Essential hypertension, benign Blood presure has been stable. Continue bp medication as prescribed   2. 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 Gastroesophageal reflux disease without esophagitis Continue pantoprazole as prescribed   General Counseling: romir beougher understanding of the findings of today's phone visit and agrees with plan of treatment. I have discussed any further diagnostic evaluation that may be  needed or ordered today. We also reviewed his medications today. he has been encouraged to call the office with any questions or concerns that should arise related to todays visit.  This patient was seen by Leretha Pol FNP Collaboration with Dr Lavera Guise as a part of collaborative care agreement  Time spent: 15 minutes    Dr Lavera Guise Internal medicine

## 2019-02-24 ENCOUNTER — Inpatient Hospital Stay: Payer: Medicare Other

## 2019-02-25 ENCOUNTER — Inpatient Hospital Stay: Payer: Medicare Other

## 2019-03-11 ENCOUNTER — Other Ambulatory Visit: Payer: Self-pay | Admitting: Oncology

## 2019-03-17 ENCOUNTER — Inpatient Hospital Stay: Payer: Medicare Other | Attending: Oncology

## 2019-03-17 ENCOUNTER — Inpatient Hospital Stay (HOSPITAL_BASED_OUTPATIENT_CLINIC_OR_DEPARTMENT_OTHER): Payer: Medicare Other | Admitting: Oncology

## 2019-03-17 ENCOUNTER — Encounter: Payer: Self-pay | Admitting: Oncology

## 2019-03-17 ENCOUNTER — Other Ambulatory Visit: Payer: Self-pay

## 2019-03-17 ENCOUNTER — Inpatient Hospital Stay: Payer: Medicare Other

## 2019-03-17 VITALS — Resp 20

## 2019-03-17 VITALS — BP 100/56 | HR 98 | Temp 98.0°F | Wt 328.0 lb

## 2019-03-17 DIAGNOSIS — D75839 Thrombocytosis, unspecified: Secondary | ICD-10-CM

## 2019-03-17 DIAGNOSIS — C9310 Chronic myelomonocytic leukemia not having achieved remission: Secondary | ICD-10-CM | POA: Insufficient documentation

## 2019-03-17 DIAGNOSIS — D473 Essential (hemorrhagic) thrombocythemia: Secondary | ICD-10-CM

## 2019-03-17 DIAGNOSIS — Z5111 Encounter for antineoplastic chemotherapy: Secondary | ICD-10-CM | POA: Insufficient documentation

## 2019-03-17 LAB — COMPREHENSIVE METABOLIC PANEL
ALT: 14 U/L (ref 0–44)
AST: 15 U/L (ref 15–41)
Albumin: 4.1 g/dL (ref 3.5–5.0)
Alkaline Phosphatase: 94 U/L (ref 38–126)
Anion gap: 8 (ref 5–15)
BUN: 24 mg/dL — ABNORMAL HIGH (ref 8–23)
CO2: 24 mmol/L (ref 22–32)
Calcium: 9.1 mg/dL (ref 8.9–10.3)
Chloride: 102 mmol/L (ref 98–111)
Creatinine, Ser: 0.97 mg/dL (ref 0.61–1.24)
GFR calc Af Amer: >60 mL/min (ref >60)
GFR calc non Af Amer: >60 mL/min (ref >60)
Glucose, Bld: 126 mg/dL — ABNORMAL HIGH (ref 70–99)
Potassium: 4.3 mmol/L (ref 3.5–5.1)
Sodium: 134 mmol/L — ABNORMAL LOW (ref 135–145)
Total Bilirubin: 0.6 mg/dL (ref 0.3–1.2)
Total Protein: 8.2 g/dL — ABNORMAL HIGH (ref 6.5–8.1)

## 2019-03-17 LAB — CBC WITH DIFFERENTIAL/PLATELET
Abs Immature Granulocytes: 0 10*3/uL (ref 0.00–0.07)
Basophils Absolute: 0.3 10*3/uL — ABNORMAL HIGH (ref 0.0–0.1)
Basophils Relative: 5 %
Eosinophils Absolute: 0.2 10*3/uL (ref 0.0–0.5)
Eosinophils Relative: 4 %
HCT: 38.8 % — ABNORMAL LOW (ref 39.0–52.0)
Hemoglobin: 11.8 g/dL — ABNORMAL LOW (ref 13.0–17.0)
Lymphocytes Relative: 26 %
Lymphs Abs: 1.3 10*3/uL (ref 0.7–4.0)
MCH: 27.4 pg (ref 26.0–34.0)
MCHC: 30.4 g/dL (ref 30.0–36.0)
MCV: 90.2 fL (ref 80.0–100.0)
Monocytes Absolute: 0.2 10*3/uL (ref 0.1–1.0)
Monocytes Relative: 3 %
Neutro Abs: 3.1 10*3/uL (ref 1.7–7.7)
Neutrophils Relative %: 62 %
Platelets: 741 10*3/uL — ABNORMAL HIGH (ref 150–400)
RBC: 4.3 MIL/uL (ref 4.22–5.81)
RDW: 24 % — ABNORMAL HIGH (ref 11.5–15.5)
Smear Review: INCREASED
WBC: 5 10*3/uL (ref 4.0–10.5)
nRBC: 0 % (ref 0.0–0.2)

## 2019-03-17 MED ORDER — ONDANSETRON HCL 4 MG PO TABS
8.0000 mg | ORAL_TABLET | Freq: Once | ORAL | Status: AC
  Administered 2019-03-17: 8 mg via ORAL
  Filled 2019-03-17: qty 2

## 2019-03-17 MED ORDER — AZACITIDINE CHEMO SQ INJECTION
50.0000 mg/m2 | Freq: Once | INTRAMUSCULAR | Status: AC
  Administered 2019-03-17: 132.5 mg via SUBCUTANEOUS
  Filled 2019-03-17: qty 5.3

## 2019-03-17 NOTE — Progress Notes (Signed)
Patient stated that she had been doing well with no complaints. 

## 2019-03-18 ENCOUNTER — Inpatient Hospital Stay: Payer: Medicare Other

## 2019-03-18 ENCOUNTER — Other Ambulatory Visit: Payer: Self-pay

## 2019-03-18 VITALS — BP 92/53 | HR 96 | Temp 96.9°F | Resp 20

## 2019-03-18 DIAGNOSIS — C9310 Chronic myelomonocytic leukemia not having achieved remission: Secondary | ICD-10-CM | POA: Diagnosis not present

## 2019-03-18 DIAGNOSIS — Z5111 Encounter for antineoplastic chemotherapy: Secondary | ICD-10-CM | POA: Diagnosis not present

## 2019-03-18 MED ORDER — ONDANSETRON HCL 4 MG PO TABS
8.0000 mg | ORAL_TABLET | Freq: Once | ORAL | Status: AC
  Administered 2019-03-18: 14:00:00 8 mg via ORAL
  Filled 2019-03-18: qty 2

## 2019-03-18 MED ORDER — AZACITIDINE CHEMO SQ INJECTION
50.0000 mg/m2 | Freq: Once | INTRAMUSCULAR | Status: AC
  Administered 2019-03-18: 132.5 mg via SUBCUTANEOUS
  Filled 2019-03-18: qty 5.3

## 2019-03-18 NOTE — Progress Notes (Signed)
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 He has recently been having bilateral knee pain and has undergone steroid injection in his knee.Patient was noted to have a high white count on his routine exam. His white count was 19.2 on 07/22/2018 with an H&H of 11.4/35.1 and a platelet count of 470. At that point he was prescribed a course of antibiotics and a repeat blood work on 08/21/2018 showed white count of 42.3, H&H of 7.1/23 with an MCV of 84 and a platelet count of 791 and hence the patient was referred to hematology for further management.  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 In addition the core biopsy showed a small focus of fibrosis containing scattering of mast cells and eosinophils most suggestive of systemic mastocytosis.bone marrow blasts 7%.Flow cytometry showed increased number of monocytic cells representing 16% of all cells with expression for HLA-DR, CD11b, CD13, CD14, CD33, CD38, CD56 and CD56 and CD64 with [facility] CD 117 are CD34.normal cytogenetics, CBL, SRSF2, SH2B3 and TET2  Repeat bone marrow biopsy after 4 cycles of Vidaza showed less pronounced monocytic/granulocytic proliferation and overall better granulopoiesis and no increase in blasts. In addition features of systemic mastocytosis not present.   Interval history-he is doing well and denies any complaints at this time.  Tolerating  Vidaza well without any significant side effects  ECOG PS- 1 Pain scale- 0 Opioid associated constipation- no  Review of systems- Review of Systems  Constitutional: Negative for chills, fever, malaise/fatigue and weight loss.  HENT: Negative for congestion, ear discharge and nosebleeds.   Eyes: Negative for blurred vision.  Respiratory: Negative for cough, hemoptysis, sputum production, shortness of breath and wheezing.   Cardiovascular: Negative for chest pain, palpitations, orthopnea and claudication.  Gastrointestinal: Negative for abdominal pain, blood in stool, constipation, diarrhea, heartburn, melena, nausea and vomiting.  Genitourinary: Negative for dysuria, flank pain, frequency, hematuria and urgency.  Musculoskeletal: Negative for back pain, joint pain and myalgias.  Skin: Negative for rash.  Neurological: Negative for dizziness, tingling, focal weakness, seizures, weakness and headaches.  Endo/Heme/Allergies: Does not bruise/bleed easily.  Psychiatric/Behavioral: Negative for depression and suicidal ideas. The patient does not have insomnia.       No Known Allergies   Past Medical History:  Diagnosis Date  . Anemia   . CMML (chronic myelomonocytic leukemia) ([place])   . Hypertension      Past Surgical History:  Procedure Laterality Date  . COLONOSCOPY WITH PROPOFOL N/A 05/03/2015   Procedure: COLONOSCOPY WITH PROPOFOL;  Surgeon: [name], MD;  Location: [facility] ENDOSCOPY;  Service: Gastroenterology;  Laterality: N/A;  . ESOPHAGOGASTRODUODENOSCOPY (EGD) WITH PROPOFOL N/A 05/03/2015   Procedure: ESOPHAGOGASTRODUODENOSCOPY (EGD) WITH PROPOFOL;  Surgeon: [name], MD;  Location: [facility] ENDOSCOPY;  Service: Gastroenterology;  Laterality: N/A;  . HERNIA REPAIR     1975    Social History   Socioeconomic History  . Marital status: Divorced    Spouse name: Not on  file  . Number of children: 4  . Years of education: Not on file  . Highest education level: Not on file   Occupational History  . Not on file  Tobacco Use  . Smoking status: Never Smoker  . Smokeless tobacco: Never Used  Substance and Sexual Activity  . Alcohol use: No  . Drug use: No  . Sexual activity: Not on file  Other Topics Concern  . Not on file  Social History Narrative  . Not on file   Social Determinants of Health   Financial Resource Strain: Low Risk   . Difficulty of Paying Living Expenses: Not hard at all  Food Insecurity: No Food Insecurity  . Worried About Charity fundraiser in the Last Year: Never true  . Ran Out of Food in the Last Year: Never true  Transportation Needs: No Transportation Needs  . Lack of Transportation (Medical): No  . Lack of Transportation (Non-Medical): No  Physical Activity: Sufficiently Active  . Days of Exercise per Week: 3 days  . Minutes of Exercise per Session: 150+ min  Stress: No Stress Concern Present  . Feeling of Stress : Not at all  Social Connections: Somewhat Isolated  . Frequency of Communication with Friends and Family: More than three times a week  . Frequency of Social Gatherings with Friends and Family: More than three times a week  . Attends Religious Services: More than 4 times per year  . Active Member of Clubs or Organizations: No  . Attends Archivist Meetings: Never  . Marital Status: Divorced  Human resources officer Violence:   . Fear of Current or Ex-Partner: Not on file  . Emotionally Abused: Not on file  . Physically Abused: Not on file  . Sexually Abused: Not on file    Family History  Problem Relation Age of Onset  . Alzheimer's disease Father   . Lung cancer Sister   . Cancer Sister      Current Outpatient Medications:  .  allopurinol (ZYLOPRIM) 300 MG tablet, TAKE 1 TABLET BY MOUTH EVERY DAY, Disp: 30 tablet, Rfl: 3 .  aspirin EC 81 MG tablet, Take 81 mg by mouth daily., Disp: , Rfl:  .  colchicine 0.6 MG tablet, Take 2 tablets (1.541m) by mouth at first sign of gout flare followed by 1  tablet (0.696m after 1 hour. (Max 1.41m54mithin 1 hour), Disp: , Rfl:  .  cyanocobalamin 500 MCG tablet, Take 500 mcg by mouth daily., Disp: , Rfl:  .  cyclobenzaprine (FLEXERIL) 10 MG tablet, Take 1 tablet by mouth., Disp: , Rfl:  .  ibuprofen (ADVIL,MOTRIN) 200 MG tablet, Take 200 mg by mouth every 6 (six) hours as needed. , Disp: , Rfl:  .  KLOR-CON M20 20 MEQ tablet, TAKE 1 TABLET BY MOUTH TWICE A DAY, Disp: 60 tablet, Rfl: 0 .  ondansetron (ZOFRAN) 8 MG tablet, Take 1 tablet (8 mg total) by mouth 2 (two) times daily as needed (Nausea or vomiting)., Disp: 30 tablet, Rfl: 1 .  pantoprazole (PROTONIX) 40 MG tablet, Take 1 tablet (40 mg total) by mouth daily., Disp: 90 tablet, Rfl: 1 .  prochlorperazine (COMPAZINE) 10 MG tablet, Take 1 tablet (10 mg total) by mouth every 6 (six) hours as needed (Nausea or vomiting)., Disp: 30 tablet, Rfl: 1 .  tiZANidine (ZANAFLEX) 4 MG tablet, Take 4 mg by mouth every 6 (six) hours as needed. , Disp: , Rfl:  .  valACYclovir (VALTREX) 500 MG tablet, TAKE  1 TABLET BY MOUTH EVERY DAY, Disp: 90 tablet, Rfl: 1 .  valsartan-hydrochlorothiazide (DIOVAN-HCT) 320-25 MG tablet, Take 1 tablet by mouth daily., Disp: 90 tablet, Rfl: 2  Physical exam:  Vitals:   03/17/19 1032  BP: (!) 100/56  Pulse: 98  Temp: 98 F (36.7 C)  TempSrc: Tympanic  Weight: (!) 328 lb (148.8 kg)   Physical Exam Constitutional:      General: He is not in acute distress. HENT:     Head: Normocephalic and atraumatic.  Eyes:     Pupils: Pupils are equal, round, and reactive to light.  Cardiovascular:     Rate and Rhythm: Normal rate and regular rhythm.     Heart sounds: Normal heart sounds.  Pulmonary:     Effort: Pulmonary effort is normal.     Breath sounds: Normal breath sounds.  Abdominal:     General: Bowel sounds are normal.     Palpations: Abdomen is soft.  Musculoskeletal:     Cervical back: Normal range of motion.  Skin:    General: Skin is warm and dry.  Neurological:      Mental Status: He is alert and oriented to person, place, and time.      CMP Latest Ref Rng & Units 03/17/2019  Glucose 70 - 99 mg/dL 126(H)  BUN 8 - 23 mg/dL 24(H)  Creatinine 0.61 - 1.24 mg/dL 0.97  Sodium 135 - 145 mmol/L 134(L)  Potassium 3.5 - 5.1 mmol/L 4.3  Chloride 98 - 111 mmol/L 102  CO2 22 - 32 mmol/L 24  Calcium 8.9 - 10.3 mg/dL 9.1  Total Protein 6.5 - 8.1 g/dL 8.2(H)  Total Bilirubin 0.3 - 1.2 mg/dL 0.6  Alkaline Phos 38 - 126 U/L 94  AST 15 - 41 U/L 15  ALT 0 - 44 U/L 14   CBC Latest Ref Rng & Units 03/17/2019  WBC 4.0 - 10.5 K/uL 5.0  Hemoglobin 13.0 - 17.0 g/dL 11.8(L)  Hematocrit 39.0 - 52.0 % 38.8(L)  Platelets 150 - 400 K/uL 741(H)      Assessment and plan- Patient is a 79 y.o. male with CMML 1 here for on treatment assessment prior to cycle 7-day 1 of Vidaza  Patient is currently receiving Vidaza day 1 to day 5 given concerns of neutropenia in the past.  He is tolerating Vidaza well without any significant side effects and we will continue the same regimen at this time.  I have personally reviewed his labs today.  He does have thrombocytosis on the day he receives Vidaza which tends to settle down and normalize 2 weeks after treatment.  His last marrow in November 2020 did show continued response to treatment.  I will consider repeating another marrow after 3 to 4 months  I will see him back in 4 weeks time for cycle 8-day 1 of Vidaza   Visit Diagnosis 1. 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 Encounter for antineoplastic chemotherapy   3. Thrombocytosis ([place])      Dr. Randa Evens, MD, MPH [facility] at [facility] [phone] 03/18/2019 1:14 PM

## 2019-03-19 ENCOUNTER — Inpatient Hospital Stay: Payer: Medicare Other

## 2019-03-19 ENCOUNTER — Other Ambulatory Visit: Payer: Self-pay

## 2019-03-19 VITALS — BP 97/64 | HR 100 | Temp 97.0°F | Resp 19

## 2019-03-19 DIAGNOSIS — Z5111 Encounter for antineoplastic chemotherapy: Secondary | ICD-10-CM | POA: Diagnosis not present

## 2019-03-19 DIAGNOSIS — C9310 Chronic myelomonocytic leukemia not having achieved remission: Secondary | ICD-10-CM | POA: Diagnosis not present

## 2019-03-19 MED ORDER — AZACITIDINE CHEMO SQ INJECTION
50.0000 mg/m2 | Freq: Once | INTRAMUSCULAR | Status: AC
  Administered 2019-03-19: 132.5 mg via SUBCUTANEOUS
  Filled 2019-03-19: qty 5.3

## 2019-03-19 MED ORDER — ONDANSETRON HCL 4 MG PO TABS
8.0000 mg | ORAL_TABLET | Freq: Once | ORAL | Status: AC
  Administered 2019-03-19: 15:00:00 8 mg via ORAL
  Filled 2019-03-19: qty 2

## 2019-03-20 ENCOUNTER — Other Ambulatory Visit: Payer: Self-pay

## 2019-03-20 ENCOUNTER — Inpatient Hospital Stay: Payer: Medicare Other

## 2019-03-20 ENCOUNTER — Other Ambulatory Visit: Payer: Self-pay | Admitting: *Deleted

## 2019-03-20 VITALS — BP 106/66 | HR 98 | Temp 96.8°F | Resp 18

## 2019-03-20 DIAGNOSIS — Z5111 Encounter for antineoplastic chemotherapy: Secondary | ICD-10-CM | POA: Diagnosis not present

## 2019-03-20 DIAGNOSIS — C9310 Chronic myelomonocytic leukemia not having achieved remission: Secondary | ICD-10-CM | POA: Diagnosis not present

## 2019-03-20 MED ORDER — VALACYCLOVIR HCL 500 MG PO TABS: 500.0000 mg | ORAL_TABLET | Freq: Every day | ORAL | 1 refills | Status: DC

## 2019-03-20 MED ORDER — ONDANSETRON HCL 4 MG PO TABS
8.0000 mg | ORAL_TABLET | Freq: Once | ORAL | Status: AC
  Administered 2019-03-20: 8 mg via ORAL
  Filled 2019-03-20: qty 2

## 2019-03-20 MED ORDER — AZACITIDINE CHEMO SQ INJECTION
50.0000 mg/m2 | Freq: Once | INTRAMUSCULAR | Status: AC
  Administered 2019-03-20: 15:00:00 132.5 mg via SUBCUTANEOUS
  Filled 2019-03-20: qty 5.3

## 2019-03-21 ENCOUNTER — Other Ambulatory Visit: Payer: Self-pay

## 2019-03-21 ENCOUNTER — Inpatient Hospital Stay: Payer: Medicare Other

## 2019-03-21 VITALS — BP 120/74 | HR 94 | Temp 97.3°F | Resp 19

## 2019-03-21 DIAGNOSIS — C9310 Chronic myelomonocytic leukemia not having achieved remission: Secondary | ICD-10-CM

## 2019-03-21 DIAGNOSIS — Z5111 Encounter for antineoplastic chemotherapy: Secondary | ICD-10-CM | POA: Diagnosis not present

## 2019-03-21 MED ORDER — AZACITIDINE CHEMO SQ INJECTION
50.0000 mg/m2 | Freq: Once | INTRAMUSCULAR | Status: AC
  Administered 2019-03-21: 14:00:00 132.5 mg via SUBCUTANEOUS
  Filled 2019-03-21: qty 5.3

## 2019-03-21 MED ORDER — ONDANSETRON HCL 4 MG PO TABS
8.0000 mg | ORAL_TABLET | Freq: Once | ORAL | Status: AC
  Administered 2019-03-21: 8 mg via ORAL
  Filled 2019-03-21: qty 2

## 2019-04-10 ENCOUNTER — Other Ambulatory Visit: Payer: Self-pay | Admitting: Oncology

## 2019-04-14 ENCOUNTER — Inpatient Hospital Stay (HOSPITAL_BASED_OUTPATIENT_CLINIC_OR_DEPARTMENT_OTHER): Payer: Medicare Other | Admitting: Oncology

## 2019-04-14 ENCOUNTER — Other Ambulatory Visit: Payer: Self-pay

## 2019-04-14 ENCOUNTER — Encounter: Payer: Self-pay | Admitting: Oncology

## 2019-04-14 ENCOUNTER — Inpatient Hospital Stay: Payer: Medicare Other | Attending: Oncology

## 2019-04-14 ENCOUNTER — Inpatient Hospital Stay: Payer: Medicare Other

## 2019-04-14 VITALS — BP 122/77 | HR 90 | Temp 97.5°F | Resp 16 | Wt 331.8 lb

## 2019-04-14 DIAGNOSIS — C9310 Chronic myelomonocytic leukemia not having achieved remission: Secondary | ICD-10-CM

## 2019-04-14 DIAGNOSIS — Z5111 Encounter for antineoplastic chemotherapy: Secondary | ICD-10-CM | POA: Insufficient documentation

## 2019-04-14 LAB — COMPREHENSIVE METABOLIC PANEL
ALT: 14 U/L (ref 0–44)
AST: 16 U/L (ref 15–41)
Albumin: 4.2 g/dL (ref 3.5–5.0)
Alkaline Phosphatase: 97 U/L (ref 38–126)
Anion gap: 9 (ref 5–15)
BUN: 23 mg/dL (ref 8–23)
CO2: 24 mmol/L (ref 22–32)
Calcium: 9.2 mg/dL (ref 8.9–10.3)
Chloride: 102 mmol/L (ref 98–111)
Creatinine, Ser: 1.12 mg/dL (ref 0.61–1.24)
GFR calc Af Amer: >60 mL/min (ref >60)
GFR calc non Af Amer: >60 mL/min (ref >60)
Glucose, Bld: 107 mg/dL — ABNORMAL HIGH (ref 70–99)
Potassium: 4.5 mmol/L (ref 3.5–5.1)
Sodium: 135 mmol/L (ref 135–145)
Total Bilirubin: 0.7 mg/dL (ref 0.3–1.2)
Total Protein: 8.4 g/dL — ABNORMAL HIGH (ref 6.5–8.1)

## 2019-04-14 LAB — CBC WITH DIFFERENTIAL/PLATELET
Abs Immature Granulocytes: 0.04 10*3/uL (ref 0.00–0.07)
Basophils Absolute: 0.1 10*3/uL (ref 0.0–0.1)
Basophils Relative: 1 %
Eosinophils Absolute: 0.1 10*3/uL (ref 0.0–0.5)
Eosinophils Relative: 1 %
HCT: 37.4 % — ABNORMAL LOW (ref 39.0–52.0)
Hemoglobin: 11.6 g/dL — ABNORMAL LOW (ref 13.0–17.0)
Immature Granulocytes: 1 %
Lymphocytes Relative: 26 %
Lymphs Abs: 1.5 10*3/uL (ref 0.7–4.0)
MCH: 28.4 pg (ref 26.0–34.0)
MCHC: 31 g/dL (ref 30.0–36.0)
MCV: 91.7 fL (ref 80.0–100.0)
Monocytes Absolute: 0.2 10*3/uL (ref 0.1–1.0)
Monocytes Relative: 3 %
Neutro Abs: 4 10*3/uL (ref 1.7–7.7)
Neutrophils Relative %: 68 %
Platelets: 671 10*3/uL — ABNORMAL HIGH (ref 150–400)
RBC: 4.08 MIL/uL — ABNORMAL LOW (ref 4.22–5.81)
RDW: 21.2 % — ABNORMAL HIGH (ref 11.5–15.5)
Smear Review: INCREASED
WBC: 5.8 10*3/uL (ref 4.0–10.5)
nRBC: 0 % (ref 0.0–0.2)

## 2019-04-14 MED ORDER — AZACITIDINE CHEMO SQ INJECTION
50.0000 mg/m2 | Freq: Once | INTRAMUSCULAR | Status: AC
  Administered 2019-04-14: 132.5 mg via SUBCUTANEOUS
  Filled 2019-04-14: qty 4

## 2019-04-14 MED ORDER — ONDANSETRON HCL 4 MG PO TABS
8.0000 mg | ORAL_TABLET | Freq: Once | ORAL | Status: AC
  Administered 2019-04-14: 8 mg via ORAL
  Filled 2019-04-14: qty 2

## 2019-04-14 NOTE — Progress Notes (Signed)
Pt here today , he needs his rx that we give that [facility]

## 2019-04-15 ENCOUNTER — Inpatient Hospital Stay: Payer: Medicare Other

## 2019-04-15 ENCOUNTER — Other Ambulatory Visit: Payer: Self-pay

## 2019-04-15 VITALS — BP 112/61 | HR 101 | Temp 97.8°F | Resp 17

## 2019-04-15 DIAGNOSIS — C9310 Chronic myelomonocytic leukemia not having achieved remission: Secondary | ICD-10-CM

## 2019-04-15 DIAGNOSIS — Z5111 Encounter for antineoplastic chemotherapy: Secondary | ICD-10-CM | POA: Diagnosis not present

## 2019-04-15 MED ORDER — AZACITIDINE CHEMO SQ INJECTION
50.0000 mg/m2 | Freq: Once | INTRAMUSCULAR | Status: AC
  Administered 2019-04-15: 132.5 mg via SUBCUTANEOUS
  Filled 2019-04-15: qty 5.3

## 2019-04-15 MED ORDER — ONDANSETRON HCL 4 MG PO TABS
8.0000 mg | ORAL_TABLET | Freq: Once | ORAL | Status: AC
  Administered 2019-04-15: 8 mg via ORAL
  Filled 2019-04-15: qty 2

## 2019-04-15 NOTE — Progress Notes (Signed)
1403: B/P 77/53 HR 106 1404: B/P 80/54 HR 106 1407: B/P 83/50 HR 105 1417: B/P 83/51 HR 103 Pt denies any symptoms or concerns. Dr. Janese Banks aware. Per Dr. Janese Banks okay to proceed with Vidaza and discharge pt home. Blood pressure/HR will be checked daily in clinic this week with patient's scheduled treatments. Pt educated to monitor blood pressure at home, pt verbalizes understanding.   1455: B/P 112/61, HR 101. Pt continues to deny any concerns or complaints. No s/s of distress noted. Pt and VS stable at time of discharge.

## 2019-04-16 ENCOUNTER — Other Ambulatory Visit: Payer: Self-pay

## 2019-04-16 ENCOUNTER — Inpatient Hospital Stay: Payer: Medicare Other

## 2019-04-16 VITALS — BP 101/59 | HR 92 | Temp 97.0°F | Resp 19

## 2019-04-16 DIAGNOSIS — C9310 Chronic myelomonocytic leukemia not having achieved remission: Secondary | ICD-10-CM

## 2019-04-16 DIAGNOSIS — Z5111 Encounter for antineoplastic chemotherapy: Secondary | ICD-10-CM | POA: Diagnosis not present

## 2019-04-16 MED ORDER — ONDANSETRON HCL 4 MG PO TABS
8.0000 mg | ORAL_TABLET | Freq: Once | ORAL | Status: AC
  Administered 2019-04-16: 8 mg via ORAL
  Filled 2019-04-16: qty 2

## 2019-04-16 MED ORDER — AZACITIDINE CHEMO SQ INJECTION
50.0000 mg/m2 | Freq: Once | INTRAMUSCULAR | Status: AC
  Administered 2019-04-16: 15:00:00 132.5 mg via SUBCUTANEOUS
  Filled 2019-04-16: qty 5.3

## 2019-04-16 NOTE — Progress Notes (Signed)
Pt.'s vital signs were taken at 1409- BP-89/51 HR-92. Pt states that he "feels fine" and has no complaints or complications at this time. MD notified. Per MD to proceed with treatment and to "make patient aware not to take his antihypertensive medication tomorrow morning until he hears from his primary doctor's office." Pt updated and all questions answered at this time. Prior to discharge pt.'s BP- 101/59 HR-92, pt stable for discharge and has no complaints at this time. RN educated pt on the importance of calling the clinic if any complications occur and if it is an emergency to call 911. Pt verbalized understanding and all questions answered at this time.   Maureena Dabbs [employer]

## 2019-04-17 ENCOUNTER — Telehealth: Payer: Self-pay

## 2019-04-17 ENCOUNTER — Inpatient Hospital Stay: Payer: Medicare Other

## 2019-04-17 ENCOUNTER — Encounter: Payer: Self-pay | Admitting: Oncology

## 2019-04-17 ENCOUNTER — Other Ambulatory Visit: Payer: Self-pay

## 2019-04-17 VITALS — BP 115/73 | HR 97 | Temp 97.9°F | Resp 20

## 2019-04-17 DIAGNOSIS — Z5111 Encounter for antineoplastic chemotherapy: Secondary | ICD-10-CM | POA: Diagnosis not present

## 2019-04-17 DIAGNOSIS — C9310 Chronic myelomonocytic leukemia not having achieved remission: Secondary | ICD-10-CM

## 2019-04-17 MED ORDER — ONDANSETRON HCL 4 MG PO TABS
8.0000 mg | ORAL_TABLET | Freq: Once | ORAL | Status: AC
  Administered 2019-04-17: 14:00:00 8 mg via ORAL
  Filled 2019-04-17: qty 2

## 2019-04-17 MED ORDER — AZACITIDINE CHEMO SQ INJECTION
50.0000 mg/m2 | Freq: Once | INTRAMUSCULAR | Status: AC
  Administered 2019-04-17: 132.5 mg via SUBCUTANEOUS
  Filled 2019-04-17: qty 5.3

## 2019-04-17 NOTE — Telephone Encounter (Signed)
Patient's blood pressure for today was low again. Dr. Janese Banks wanted me to call his PCP to let her know about his blood pressure staying low. [place], RN stated that they would contact the patient to address his blood pressure.

## 2019-04-17 NOTE — Telephone Encounter (Signed)
Called patient's PCP Leretha Pol) and spoke to [place], Therapist, sports and told her that patient's blood pressure had been low, therefore, Dr. Janese Banks told him to stop his blood pressure medication. Then this week his blood pressure

## 2019-04-17 NOTE — Progress Notes (Signed)
Patient reports that he did not take his blood pressure medication today as instructed by Dr. Janese Banks yesterday. Patient also reports he has not heard from his primary care doctor's office yet. Blood Pressure: 115/73 today. MD, Dr. Janese Banks, notified and aware. Per MD, Dr. Janese Banks, order: patient should continue to hold blood pressure medication until he talks to his primary care doctor and patient should call primary care doctor if he doesn't hear anything back today. Patient informed and verbalized understanding.

## 2019-04-17 NOTE — Progress Notes (Signed)
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 He has recently been having bilateral knee pain and has undergone steroid injection in his knee.Patient was noted to have a high white count on his routine exam. His white count was 19.2 on 07/22/2018 with an H&H of 11.4/35.1 and a platelet count of 470. At that point he was prescribed a course of antibiotics and a repeat blood work on 08/21/2018 showed white count of 42.3, H&H of 7.1/23 with an MCV of 84 and a platelet count of 791 and hence the patient was referred to hematology for further management.  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 In addition the core biopsy showed a small focus of fibrosis containing scattering of mast cells and eosinophils most suggestive of systemic mastocytosis.bone marrow blasts 7%.Flow cytometry showed increased number of monocytic cells representing 16% of all cells with expression for HLA-DR, CD11b, CD13, CD14, CD33, CD38, CD56 and CD56 and CD64 with [facility] CD 117 are CD34.normal cytogenetics, CBL, SRSF2, SH2B3 and TET2  Repeat bone marrow biopsy after 4 cycles of Vidaza showed less pronounced monocytic/granulocytic proliferation and overall better granulopoiesis and no increase in blasts. In addition features of systemic mastocytosis not present.   Interval history- reports doing well. Denies any complaints at this time  ECOG PS-  1 Pain scale- 0   Review of systems- Review of Systems  Constitutional: Negative for chills, fever, malaise/fatigue and weight loss.  HENT: Negative for congestion, ear discharge and nosebleeds.   Eyes: Negative for blurred vision.  Respiratory: Negative for cough, hemoptysis, sputum production, shortness of breath and wheezing.   Cardiovascular: Negative for chest pain, palpitations, orthopnea and claudication.  Gastrointestinal: Negative for abdominal pain, blood in stool, constipation, diarrhea, heartburn, melena, nausea and vomiting.  Genitourinary: Negative for dysuria, flank pain, frequency, hematuria and urgency.  Musculoskeletal: Negative for back pain, joint pain and myalgias.  Skin: Negative for rash.  Neurological: Negative for dizziness, tingling, focal weakness, seizures, weakness and headaches.  Endo/Heme/Allergies: Does not bruise/bleed easily.  Psychiatric/Behavioral: Negative for depression and suicidal ideas. The patient does not have insomnia.       No Known Allergies   Past Medical History:  Diagnosis Date  . Anemia   . CMML (chronic myelomonocytic leukemia) ([place])   . Hypertension      Past Surgical History:  Procedure Laterality Date  . COLONOSCOPY WITH PROPOFOL N/A 05/03/2015   Procedure: COLONOSCOPY WITH PROPOFOL;  Surgeon: [name], MD;  Location: [facility] ENDOSCOPY;  Service: Gastroenterology;  Laterality: N/A;  . ESOPHAGOGASTRODUODENOSCOPY (EGD) WITH PROPOFOL N/A 05/03/2015   Procedure: ESOPHAGOGASTRODUODENOSCOPY (EGD) WITH PROPOFOL;  Surgeon: [name], MD;  Location: [facility] ENDOSCOPY;  Service: Gastroenterology;  Laterality: N/A;  . HERNIA REPAIR     1975    Social History   Socioeconomic History  . Marital status: Divorced    Spouse name: Not on file  . Number of children: 4  . Years of education: Not on  file  . Highest education level: Not on file  Occupational History  . Not on file  Tobacco Use  . Smoking status: Never Smoker  . Smokeless  tobacco: Never Used  Substance and Sexual Activity  . Alcohol use: No  . Drug use: No  . Sexual activity: Not on file  Other Topics Concern  . Not on file  Social History Narrative  . Not on file   Social Determinants of Health   Financial Resource Strain: Low Risk   . Difficulty of Paying Living Expenses: Not hard at all  Food Insecurity: No Food Insecurity  . Worried About Charity fundraiser in the Last Year: Never true  . Ran Out of Food in the Last Year: Never true  Transportation Needs: No Transportation Needs  . Lack of Transportation (Medical): No  . Lack of Transportation (Non-Medical): No  Physical Activity: Sufficiently Active  . Days of Exercise per Week: 3 days  . Minutes of Exercise per Session: 150+ min  Stress: No Stress Concern Present  . Feeling of Stress : Not at all  Social Connections: Somewhat Isolated  . Frequency of Communication with Friends and Family: More than three times a week  . Frequency of Social Gatherings with Friends and Family: More than three times a week  . Attends Religious Services: More than 4 times per year  . Active Member of Clubs or Organizations: No  . Attends Archivist Meetings: Never  . Marital Status: Divorced  Human resources officer Violence:   . Fear of Current or Ex-Partner: Not on file  . Emotionally Abused: Not on file  . Physically Abused: Not on file  . Sexually Abused: Not on file    Family History  Problem Relation Age of Onset  . Alzheimer's disease Father   . Lung cancer Sister   . Cancer Sister      Current Outpatient Medications:  .  allopurinol (ZYLOPRIM) 300 MG tablet, TAKE 1 TABLET BY MOUTH EVERY DAY, Disp: 30 tablet, Rfl: 3 .  aspirin EC 81 MG tablet, Take 81 mg by mouth daily., Disp: , Rfl:  .  cyanocobalamin 500 MCG tablet, Take 500 mcg by mouth daily., Disp: , Rfl:  .  cyclobenzaprine (FLEXERIL) 10 MG tablet, Take 1 tablet by mouth., Disp: , Rfl:  .  ibuprofen (ADVIL,MOTRIN) 200 MG tablet,  Take 200 mg by mouth every 6 (six) hours as needed. , Disp: , Rfl:  .  KLOR-CON M20 20 MEQ tablet, TAKE 1 TABLET BY MOUTH TWICE A DAY, Disp: 60 tablet, Rfl: 0 .  ondansetron (ZOFRAN) 8 MG tablet, Take 1 tablet (8 mg total) by mouth 2 (two) times daily as needed (Nausea or vomiting)., Disp: 30 tablet, Rfl: 1 .  pantoprazole (PROTONIX) 40 MG tablet, Take 1 tablet (40 mg total) by mouth daily., Disp: 90 tablet, Rfl: 1 .  prochlorperazine (COMPAZINE) 10 MG tablet, Take 1 tablet (10 mg total) by mouth every 6 (six) hours as needed (Nausea or vomiting)., Disp: 30 tablet, Rfl: 1 .  tiZANidine (ZANAFLEX) 4 MG tablet, Take 4 mg by mouth every 6 (six) hours as needed. , Disp: , Rfl:  .  valACYclovir (VALTREX) 500 MG tablet, Take 1 tablet (500 mg total) by mouth daily., Disp: 90 tablet, Rfl: 1 .  valsartan-hydrochlorothiazide (DIOVAN-HCT) 320-25 MG tablet, Take 1 tablet by mouth daily., Disp: 90 tablet, Rfl: 2 .  colchicine 0.6 MG tablet, Take 2 tablets (1.40m) by mouth at first sign of gout flare  followed by 1 tablet (0.44m) after 1 hour. (Max 1.871mwithin 1 hour), Disp: , Rfl:   Physical exam:  Vitals:   04/14/19 1333 04/14/19 1334  BP: 122/77   Pulse: 90   Resp: 16   Temp:  (!) 97.5 F (36.4 C)  TempSrc: Tympanic Tympanic  Weight: (!) 331 lb 12.8 oz (150.5 kg)    Physical Exam Constitutional:      General: He is not in acute distress. HENT:     Head: Normocephalic and atraumatic.  Eyes:     Pupils: Pupils are equal, round, and reactive to light.  Cardiovascular:     Rate and Rhythm: Normal rate and regular rhythm.     Heart sounds: Normal heart sounds.  Pulmonary:     Effort: Pulmonary effort is normal.     Breath sounds: Normal breath sounds.  Abdominal:     General: Bowel sounds are normal.     Palpations: Abdomen is soft.  Musculoskeletal:     Cervical back: Normal range of motion.  Skin:    General: Skin is warm and dry.  Neurological:     Mental Status: He is alert and oriented  to person, place, and time.      CMP Latest Ref Rng & Units 04/14/2019  Glucose 70 - 99 mg/dL 107(H)  BUN 8 - 23 mg/dL 23  Creatinine 0.61 - 1.24 mg/dL 1.12  Sodium 135 - 145 mmol/L 135  Potassium 3.5 - 5.1 mmol/L 4.5  Chloride 98 - 111 mmol/L 102  CO2 22 - 32 mmol/L 24  Calcium 8.9 - 10.3 mg/dL 9.2  Total Protein 6.5 - 8.1 g/dL 8.4(H)  Total Bilirubin 0.3 - 1.2 mg/dL 0.7  Alkaline Phos 38 - 126 U/L 97  AST 15 - 41 U/L 16  ALT 0 - 44 U/L 14   CBC Latest Ref Rng & Units 04/14/2019  WBC 4.0 - 10.5 K/uL 5.8  Hemoglobin 13.0 - 17.0 g/dL 11.6(L)  Hematocrit 39.0 - 52.0 % 37.4(L)  Platelets 150 - 400 K/uL 671(H)     Assessment and plan- Patient is a [age].o. male with CMML1 here for on treatment assessment prior to cycle 8 of day 1 vidaza  Patient continues to tolerate vidaza well without any significant side effects. He will continue this until progression or toxicity. He gets it day 1 to day 5.   Patient has baseline thrombocytosis on day 1 of vidaza but does normalize when checked 2 weeks later. Continue to monitor.  I will see him in 4 weeks for cycle 9   Visit Diagnosis 1. Encounter for antineoplastic chemotherapy   2. 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 ArRanda EvensMD, MPH [id number]/01/2020 10:43 AM

## 2019-04-18 ENCOUNTER — Inpatient Hospital Stay: Payer: Medicare Other

## 2019-04-18 ENCOUNTER — Other Ambulatory Visit: Payer: Self-pay

## 2019-04-18 VITALS — BP 123/65 | HR 99 | Temp 97.5°F | Resp 20

## 2019-04-18 DIAGNOSIS — Z5111 Encounter for antineoplastic chemotherapy: Secondary | ICD-10-CM | POA: Diagnosis not present

## 2019-04-18 DIAGNOSIS — C9310 Chronic myelomonocytic leukemia not having achieved remission: Secondary | ICD-10-CM

## 2019-04-18 MED ORDER — ONDANSETRON HCL 4 MG PO TABS
8.0000 mg | ORAL_TABLET | Freq: Once | ORAL | Status: AC
  Administered 2019-04-18: 8 mg via ORAL
  Filled 2019-04-18: qty 2

## 2019-04-18 MED ORDER — AZACITIDINE CHEMO SQ INJECTION
50.0000 mg/m2 | Freq: Once | INTRAMUSCULAR | Status: AC
  Administered 2019-04-18: 132.5 mg via SUBCUTANEOUS
  Filled 2019-04-18: qty 5.3

## 2019-04-21 ENCOUNTER — Ambulatory Visit (INDEPENDENT_AMBULATORY_CARE_PROVIDER_SITE_OTHER): Payer: Medicare Other | Admitting: Nurse Practitioner

## 2019-04-21 ENCOUNTER — Encounter: Payer: Self-pay | Admitting: Nurse Practitioner

## 2019-04-21 ENCOUNTER — Other Ambulatory Visit: Payer: Self-pay

## 2019-04-21 VITALS — BP 129/82 | HR 85 | Temp 97.4°F | Resp 16 | Ht 74.5 in | Wt 333.6 lb

## 2019-04-21 DIAGNOSIS — I10 Essential (primary) hypertension: Secondary | ICD-10-CM

## 2019-04-21 DIAGNOSIS — K219 Gastro-esophageal reflux disease without esophagitis: Secondary | ICD-10-CM

## 2019-04-21 DIAGNOSIS — C9310 Chronic myelomonocytic leukemia not having achieved remission: Secondary | ICD-10-CM

## 2019-04-21 MED ORDER — VALSARTAN-HYDROCHLOROTHIAZIDE 320-25 MG PO TABS: ORAL_TABLET | ORAL | 2 refills | Status: DC

## 2019-04-21 NOTE — Progress Notes (Signed)
[facility] [place], [place] [address]  Internal MEDICINE  Office Visit Note  Patient Name: Samuel Frank  [id number]  PA:[id number]  Date of Service: 04/25/2019  Chief Complaint  Patient presents with  . Follow-up    pt oncologist stated that pt bp is fluctuating, wanted to manage meds if needed  . Hypertension    The patient is here for routine follow up. He is undergoing treatment for chronic myelomonocytic leukemia. He is getting treatments 5 days per month. This past month, blood pressure was somewhat low. At one point, his blood pressure was 84 systolic. He held the medication and it returned to normal. He has not had his medication yet today. He denies headache, dizziness, nausea, or other acute symptoms related to hypotension.       Current Medication: Outpatient Encounter Medications as of 04/21/2019  Medication Sig  . allopurinol (ZYLOPRIM) 300 MG tablet TAKE 1 TABLET BY MOUTH EVERY DAY  . aspirin EC 81 MG tablet Take 81 mg by mouth daily.  . colchicine 0.6 MG tablet Take 2 tablets (1.2mg ) by mouth at first sign of gout flare followed by 1 tablet (0.6mg ) after 1 hour. (Max 1.8mg  within 1 hour)  . cyanocobalamin 500 MCG tablet Take 500 mcg by mouth daily.  . cyclobenzaprine (FLEXERIL) 10 MG tablet Take 1 tablet by mouth.  [name] ibuprofen (ADVIL,MOTRIN) 200 MG tablet Take 200 mg by mouth every 6 (six) hours as needed.   [name] KLOR-CON M20 20 MEQ tablet TAKE 1 TABLET BY MOUTH TWICE A DAY  . ondansetron (ZOFRAN) 8 MG tablet Take 1 tablet (8 mg total) by mouth 2 (two) times daily as needed (Nausea or vomiting).  . pantoprazole (PROTONIX) 40 MG tablet Take 1 tablet (40 mg total) by mouth daily.  . prochlorperazine (COMPAZINE) 10 MG tablet Take 1 tablet (10 mg total) by mouth every 6 (six) hours as needed (Nausea or vomiting).  [name] tiZANidine (ZANAFLEX) 4 MG tablet Take 4 mg by mouth every 6 (six) hours as needed.   . valACYclovir (VALTREX) 500 MG tablet Take 1  tablet (500 mg total) by mouth daily.  . valsartan-hydrochlorothiazide (DIOVAN-HCT) 320-25 MG tablet Take 1/2 tablet po QD  . [DISCONTINUED] valsartan-hydrochlorothiazide (DIOVAN-HCT) 320-25 MG tablet Take 1 tablet by mouth daily.   No facility-administered encounter medications on file as of 04/21/2019.    Surgical History: Past Surgical History:  Procedure Laterality Date  . COLONOSCOPY WITH PROPOFOL N/A 05/03/2015   Procedure: COLONOSCOPY WITH PROPOFOL;  Surgeon: [name], MD;  Location: [facility] ENDOSCOPY;  Service: Gastroenterology;  Laterality: N/A;  . ESOPHAGOGASTRODUODENOSCOPY (EGD) WITH PROPOFOL N/A 05/03/2015   Procedure: ESOPHAGOGASTRODUODENOSCOPY (EGD) WITH PROPOFOL;  Surgeon: [name], MD;  Location: [facility] ENDOSCOPY;  Service: Gastroenterology;  Laterality: N/A;  . HERNIA REPAIR     1975    Medical History: Past Medical History:  Diagnosis Date  . Anemia   . CMML (chronic myelomonocytic leukemia) ([place])   . Hypertension     Family History: Family History  Problem Relation Age of Onset  . Alzheimer's disease Father   . Lung cancer Sister   . Cancer Sister     Social History   Socioeconomic History  . Marital status: Divorced    Spouse name: Not on file  . Number of children: 4  . Years of education: Not on file  . Highest education level: Not on file  Occupational History  . Not on file  Tobacco Use  .  Smoking status: Never Smoker  . Smokeless tobacco: Never Used  Substance and Sexual Activity  . Alcohol use: No  . Drug use: No  . Sexual activity: Not on file  Other Topics Concern  . Not on file  Social History Narrative  . Not on file   Social Determinants of Health   Financial Resource Strain: Low Risk   . Difficulty of Paying Living Expenses: Not hard at all  Food Insecurity: No Food Insecurity  . Worried About Charity fundraiser in the Last Year: Never true  . Ran Out of Food in the Last Year: Never true  Transportation Needs: No Transportation  Needs  . Lack of Transportation (Medical): No  . Lack of Transportation (Non-Medical): No  Physical Activity: Sufficiently Active  . Days of Exercise per Week: 3 days  . Minutes of Exercise per Session: 150+ min  Stress: No Stress Concern Present  . Feeling of Stress : Not at all  Social Connections: Somewhat Isolated  . Frequency of Communication with Friends and Family: More than three times a week  . Frequency of Social Gatherings with Friends and Family: More than three times a week  . Attends Religious Services: More than 4 times per year  . Active Member of Clubs or Organizations: No  . Attends Archivist Meetings: Never  . Marital Status: Divorced  Human resources officer Violence:   . Fear of Current or Ex-Partner: Not on file  . Emotionally Abused: Not on file  . Physically Abused: Not on file  . Sexually Abused: Not on file      Review of Systems  Constitutional: Positive for fatigue. Negative for activity change, chills and unexpected weight change.  HENT: Negative for congestion, postnasal drip, rhinorrhea, sneezing and sore throat.   Respiratory: Negative for cough, chest tightness, shortness of breath and wheezing.   Cardiovascular: Negative for chest pain and palpitations.       Few episodes of hypotension while receiving chemotherapy treatments. Had to hold blood pressure medications due to low pressure. This has rebounded on it's own. Has cut dose of valsartan/HCTZ to 1/2 tablet daily.  Gastrointestinal: Negative for abdominal pain, constipation, diarrhea, nausea and vomiting.  Endocrine: Negative for cold intolerance, heat intolerance, polydipsia and polyuria.  Musculoskeletal: Negative for arthralgias, back pain, joint swelling and neck pain.  Skin: Negative for rash.  Allergic/Immunologic: Negative for environmental allergies.  Neurological: Negative for dizziness, tremors, numbness and headaches.  Hematological: Negative for adenopathy. Does not  bruise/bleed easily.  Psychiatric/Behavioral: Negative for behavioral problems (Depression), sleep disturbance and suicidal ideas. The patient is not nervous/anxious.     Today's Vitals   04/21/19 1129  BP: 129/82  Pulse: 85  Resp: 16  Temp: (!) 97.4 F (36.3 C)  SpO2: 96%  Weight: (!) 333 lb 9.6 oz (151.3 kg)  Height: 6' 2.5" (1.892 m)   Body mass index is 42.26 kg/m.  Physical Exam Vitals and nursing note reviewed.  Constitutional:      General: He is not in acute distress.    Appearance: Normal appearance. He is well-developed. He is not diaphoretic.  HENT:     Head: Normocephalic and atraumatic.     Mouth/Throat:     Pharynx: No oropharyngeal exudate.  Eyes:     Conjunctiva/sclera: Conjunctivae normal.     Pupils: Pupils are equal, round, and reactive to light.  Neck:     Thyroid: No thyromegaly.     Vascular: No carotid bruit or JVD.  Trachea: No tracheal deviation.  Cardiovascular:     Rate and Rhythm: Normal rate and regular rhythm.     Heart sounds: Normal heart sounds. No murmur. No friction rub. No gallop.   Pulmonary:     Effort: Pulmonary effort is normal. No respiratory distress.     Breath sounds: Normal breath sounds. No wheezing or rales.  Chest:     Chest wall: No tenderness.  Musculoskeletal:        General: Normal range of motion.     Cervical back: Normal range of motion and neck supple.     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 Left wrist is tender and currently in immobilizing splint   Lymphadenopathy:     Cervical: No cervical adenopathy.  Skin:    General: Skin is warm and dry.  Neurological:     Mental Status: He is alert and oriented to person, place, and time. Mental status is at baseline.     Cranial Nerves: No cranial nerve deficit.  Psychiatric:        Behavior: Behavior normal.        Thought Content: Thought content normal.        Judgment: Judgment normal.    Assessment/Plan: 1. Essential  hypertension, benign BP fluctuating. Having episodes of hypotension during chemotherapy treatments. Cut dose of valsartan/HCTZ to 1/2 tablet daily. Monitor closely.  - valsartan-hydrochlorothiazide (DIOVAN-HCT) 320-25 MG tablet; Take 1/2 tablet po QD  Dispense: 90 tablet; Refill: 2  2. Chronic myelomonocytic leukemia not having achieved remission ([place]) Continue regular visits and treatments with oncology.   3. Gastroesophageal reflux disease without esophagitis Continue pantoprazole as prescribed   General Counseling: thaine southers understanding of the findings of todays visit and agrees with plan of treatment. I have discussed any further diagnostic evaluation that may be needed or ordered today. We also reviewed his medications today. he has been encouraged to call the office with any questions or concerns that should arise related to todays visit.  This patient was seen by [place] with Dr Lavera Guise as a part of collaborative care agreement  Meds ordered this encounter  Medications  . valsartan-hydrochlorothiazide (DIOVAN-HCT) 320-25 MG tablet    Sig: Take 1/2 tablet po QD    Dispense:  90 tablet    Refill:  2    Note that decreasing blood pressure medication.    Order Specific Question:   Supervising Provider    Answer:   Lavera Guise [id number]    Total time spent: 30 Minutes   Time spent includes review of chart, medications, test results, and follow up plan with the patient.      Dr Lavera Guise Internal medicine

## 2019-04-29 ENCOUNTER — Telehealth: Payer: Self-pay

## 2019-04-29 NOTE — Telephone Encounter (Signed)
DISABILITY PARKING PLACARD PAPERWORK READY. NOTIFIED PATIENT READY FOR PICK UP AT [place].

## 2019-05-03 DIAGNOSIS — Z23 Encounter for immunization: Secondary | ICD-10-CM | POA: Diagnosis not present

## 2019-05-05 ENCOUNTER — Other Ambulatory Visit: Payer: Self-pay | Admitting: Oncology

## 2019-05-05 NOTE — Telephone Encounter (Signed)
...    Ref Range & Units 3 wk ago  Potassium 3.5 - 5.1 mmol/L 4.5

## 2019-05-12 ENCOUNTER — Inpatient Hospital Stay (HOSPITAL_BASED_OUTPATIENT_CLINIC_OR_DEPARTMENT_OTHER): Payer: Medicare Other | Admitting: Oncology

## 2019-05-12 ENCOUNTER — Inpatient Hospital Stay: Payer: Medicare Other

## 2019-05-12 ENCOUNTER — Inpatient Hospital Stay: Payer: Medicare Other | Attending: Oncology

## 2019-05-12 ENCOUNTER — Encounter: Payer: Self-pay | Admitting: Oncology

## 2019-05-12 ENCOUNTER — Other Ambulatory Visit: Payer: Self-pay

## 2019-05-12 VITALS — BP 99/76 | HR 95 | Temp 98.0°F | Wt 334.2 lb

## 2019-05-12 VITALS — BP 125/76 | HR 93 | Resp 20

## 2019-05-12 DIAGNOSIS — C9310 Chronic myelomonocytic leukemia not having achieved remission: Secondary | ICD-10-CM | POA: Diagnosis not present

## 2019-05-12 DIAGNOSIS — Z5111 Encounter for antineoplastic chemotherapy: Secondary | ICD-10-CM | POA: Insufficient documentation

## 2019-05-12 LAB — CBC WITH DIFFERENTIAL/PLATELET
Abs Immature Granulocytes: 0 10*3/uL (ref 0.00–0.07)
Band Neutrophils: 4 %
Basophils Absolute: 0.1 10*3/uL (ref 0.0–0.1)
Basophils Relative: 2 %
Eosinophils Absolute: 0.1 10*3/uL (ref 0.0–0.5)
Eosinophils Relative: 3 %
HCT: 36.8 % — ABNORMAL LOW (ref 39.0–52.0)
Hemoglobin: 11.3 g/dL — ABNORMAL LOW (ref 13.0–17.0)
Lymphocytes Relative: 37 %
Lymphs Abs: 1.6 10*3/uL (ref 0.7–4.0)
MCH: 28.4 pg (ref 26.0–34.0)
MCHC: 30.7 g/dL (ref 30.0–36.0)
MCV: 92.5 fL (ref 80.0–100.0)
Monocytes Absolute: 0.3 10*3/uL (ref 0.1–1.0)
Monocytes Relative: 6 %
Neutro Abs: 2.2 10*3/uL (ref 1.7–7.7)
Neutrophils Relative %: 48 %
Platelets: 742 10*3/uL — ABNORMAL HIGH (ref 150–400)
RBC: 3.98 MIL/uL — ABNORMAL LOW (ref 4.22–5.81)
RDW: 19.9 % — ABNORMAL HIGH (ref 11.5–15.5)
Smear Review: NORMAL
WBC Morphology: ABNORMAL
WBC: 4.2 10*3/uL (ref 4.0–10.5)
nRBC: 0 % (ref 0.0–0.2)

## 2019-05-12 LAB — COMPREHENSIVE METABOLIC PANEL
ALT: 14 U/L (ref 0–44)
AST: 16 U/L (ref 15–41)
Albumin: 4 g/dL (ref 3.5–5.0)
Alkaline Phosphatase: 92 U/L (ref 38–126)
Anion gap: 7 (ref 5–15)
BUN: 10 mg/dL (ref 8–23)
CO2: 25 mmol/L (ref 22–32)
Calcium: 8.8 mg/dL — ABNORMAL LOW (ref 8.9–10.3)
Chloride: 102 mmol/L (ref 98–111)
Creatinine, Ser: 1 mg/dL (ref 0.61–1.24)
GFR calc Af Amer: >60 mL/min (ref >60)
GFR calc non Af Amer: >60 mL/min (ref >60)
Glucose, Bld: 108 mg/dL — ABNORMAL HIGH (ref 70–99)
Potassium: 4 mmol/L (ref 3.5–5.1)
Sodium: 134 mmol/L — ABNORMAL LOW (ref 135–145)
Total Bilirubin: 0.6 mg/dL (ref 0.3–1.2)
Total Protein: 7.8 g/dL (ref 6.5–8.1)

## 2019-05-12 MED ORDER — ONDANSETRON HCL 4 MG PO TABS
8.0000 mg | ORAL_TABLET | Freq: Once | ORAL | Status: AC
  Administered 2019-05-12: 14:00:00 8 mg via ORAL
  Filled 2019-05-12: qty 2

## 2019-05-12 MED ORDER — AZACITIDINE CHEMO SQ INJECTION
50.0000 mg/m2 | Freq: Once | INTRAMUSCULAR | Status: AC
  Administered 2019-05-12: 132.5 mg via SUBCUTANEOUS
  Filled 2019-05-12: qty 5.3

## 2019-05-12 NOTE — Progress Notes (Signed)
Patient would like to know if treatment is working and how provider knows the treatment is working

## 2019-05-13 ENCOUNTER — Other Ambulatory Visit: Payer: Self-pay

## 2019-05-13 ENCOUNTER — Inpatient Hospital Stay: Payer: Medicare Other

## 2019-05-13 VITALS — BP 117/68 | HR 98 | Temp 97.3°F | Resp 20

## 2019-05-13 DIAGNOSIS — C9310 Chronic myelomonocytic leukemia not having achieved remission: Secondary | ICD-10-CM | POA: Diagnosis not present

## 2019-05-13 DIAGNOSIS — Z5111 Encounter for antineoplastic chemotherapy: Secondary | ICD-10-CM | POA: Diagnosis not present

## 2019-05-13 MED ORDER — ONDANSETRON HCL 4 MG PO TABS
8.0000 mg | ORAL_TABLET | Freq: Once | ORAL | Status: AC
  Administered 2019-05-13: 14:00:00 8 mg via ORAL
  Filled 2019-05-13: qty 2

## 2019-05-13 MED ORDER — AZACITIDINE CHEMO SQ INJECTION
50.0000 mg/m2 | Freq: Once | INTRAMUSCULAR | Status: AC
  Administered 2019-05-13: 132.5 mg via SUBCUTANEOUS
  Filled 2019-05-13: qty 5.3

## 2019-05-14 ENCOUNTER — Other Ambulatory Visit: Payer: Self-pay

## 2019-05-14 ENCOUNTER — Encounter: Payer: Self-pay | Admitting: Oncology

## 2019-05-14 ENCOUNTER — Inpatient Hospital Stay: Payer: Medicare Other

## 2019-05-14 VITALS — BP 120/65 | HR 91 | Resp 19

## 2019-05-14 DIAGNOSIS — C9310 Chronic myelomonocytic leukemia not having achieved remission: Secondary | ICD-10-CM | POA: Diagnosis not present

## 2019-05-14 DIAGNOSIS — Z5111 Encounter for antineoplastic chemotherapy: Secondary | ICD-10-CM | POA: Diagnosis not present

## 2019-05-14 MED ORDER — AZACITIDINE CHEMO SQ INJECTION
50.0000 mg/m2 | Freq: Once | INTRAMUSCULAR | Status: AC
  Administered 2019-05-14: 132.5 mg via SUBCUTANEOUS
  Filled 2019-05-14: qty 5.3

## 2019-05-14 MED ORDER — ONDANSETRON HCL 4 MG PO TABS
8.0000 mg | ORAL_TABLET | Freq: Once | ORAL | Status: AC
  Administered 2019-05-14: 14:00:00 8 mg via ORAL
  Filled 2019-05-14: qty 2

## 2019-05-14 NOTE — Progress Notes (Signed)
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 He has recently been having bilateral knee pain and has undergone steroid injection in his knee.Patient was noted to have a high white count on his routine exam. His white count was 19.2 on 07/22/2018 with an H&H of 11.4/35.1 and a platelet count of 470. At that point he was prescribed a course of antibiotics and a repeat blood work on 08/21/2018 showed white count of 42.3, H&H of 7.1/23 with an MCV of 84 and a platelet count of 791 and hence the patient was referred to hematology for further management.  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 In addition the core biopsy showed a small focus of fibrosis containing scattering of mast cells and eosinophils most suggestive of systemic mastocytosis.bone marrow blasts 7%.Flow cytometry showed increased number of monocytic cells representing 16% of all cells with expression for HLA-DR, CD11b, CD13, CD14, CD33, CD38, CD56 and CD56 and CD64 with [facility] CD 117 are CD34.normal cytogenetics, CBL, SRSF2, SH2B3 and TET2  Repeat bone marrow biopsy after 4 cycles of Vidaza showed less pronounced monocytic/granulocytic proliferation and overall better granulopoiesis and no increase in blasts. In addition features of systemic mastocytosis not present.  Interval history-tolerating Vidaza well without any significant side effects.  He has received  his first dose of Covid vaccine.  ECOG PS- 1 Pain scale- 0 Opioid associated constipation- no  Review of systems- Review of Systems  Constitutional: Negative for chills, fever, malaise/fatigue and weight loss.  HENT: Negative for congestion, ear discharge and nosebleeds.   Eyes: Negative for blurred vision.  Respiratory: Negative for cough, hemoptysis, sputum production, shortness of breath and wheezing.   Cardiovascular: Negative for chest pain, palpitations, orthopnea and claudication.  Gastrointestinal: Negative for abdominal pain, blood in stool, constipation, diarrhea, heartburn, melena, nausea and vomiting.  Genitourinary: Negative for dysuria, flank pain, frequency, hematuria and urgency.  Musculoskeletal: Negative for back pain, joint pain and myalgias.  Skin: Negative for rash.  Neurological: Negative for dizziness, tingling, focal weakness, seizures, weakness and headaches.  Endo/Heme/Allergies: Does not bruise/bleed easily.  Psychiatric/Behavioral: Negative for depression and suicidal ideas. The patient does not have insomnia.       No Known Allergies   Past Medical History:  Diagnosis Date  . Anemia   . CMML (chronic myelomonocytic leukemia) ([place])   . Hypertension      Past Surgical History:  Procedure Laterality Date  . COLONOSCOPY WITH PROPOFOL N/A 05/03/2015   Procedure: COLONOSCOPY WITH PROPOFOL;  Surgeon: [name], MD;  Location: [facility] ENDOSCOPY;  Service: Gastroenterology;  Laterality: N/A;  . ESOPHAGOGASTRODUODENOSCOPY (EGD) WITH PROPOFOL N/A 05/03/2015   Procedure: ESOPHAGOGASTRODUODENOSCOPY (EGD) WITH PROPOFOL;  Surgeon: [name], MD;  Location: [facility] ENDOSCOPY;  Service: Gastroenterology;  Laterality: N/A;  . HERNIA REPAIR     1975    Social History   Socioeconomic History  . Marital status: Divorced    Spouse name: Not on file  . Number  of children: 4  . Years of education: Not on file  . Highest education level: Not on file  Occupational  History  . Not on file  Tobacco Use  . Smoking status: Never Smoker  . Smokeless tobacco: Never Used  Substance and Sexual Activity  . Alcohol use: No  . Drug use: No  . Sexual activity: Not on file  Other Topics Concern  . Not on file  Social History Narrative  . Not on file   Social Determinants of Health   Financial Resource Strain: Low Risk   . Difficulty of Paying Living Expenses: Not hard at all  Food Insecurity: No Food Insecurity  . Worried About Running Out of Food in the Last Year: Never true  . Ran Out of Food in the Last Year: Never true  Transportation Needs: No Transportation Needs  . Lack of Transportation (Medical): No  . Lack of Transportation (Non-Medical): No  Physical Activity: Sufficiently Active  . Days of Exercise per Week: 3 days  . Minutes of Exercise per Session: 150+ min  Stress: No Stress Concern Present  . Feeling of Stress : Not at all  Social Connections: Somewhat Isolated  . Frequency of Communication with Friends and Family: More than three times a week  . Frequency of Social Gatherings with Friends and Family: More than three times a week  . Attends Religious Services: More than 4 times per year  . Active Member of Clubs or Organizations: No  . Attends Club or Organization Meetings: Never  . Marital Status: Divorced  Intimate Partner Violence:   . Fear of Current or Ex-Partner: Not on file  . Emotionally Abused: Not on file  . Physically Abused: Not on file  . Sexually Abused: Not on file    Family History  Problem Relation Age of Onset  . Alzheimer's disease Father   . Lung cancer Sister   . Cancer Sister      Current Outpatient Medications:  .  allopurinol (ZYLOPRIM) 300 MG tablet, TAKE 1 TABLET BY MOUTH EVERY DAY, Disp: 30 tablet, Rfl: 3 .  aspirin EC 81 MG tablet, Take 81 mg by mouth daily., Disp: , Rfl:  .  colchicine 0.6 MG tablet, Take 2 tablets (1.2mg) by mouth at first sign of gout flare followed by 1 tablet (0.6mg)  after 1 hour. (Max 1.8mg within 1 hour), Disp: , Rfl:  .  cyanocobalamin 500 MCG tablet, Take 500 mcg by mouth daily., Disp: , Rfl:  .  cyclobenzaprine (FLEXERIL) 10 MG tablet, Take 1 tablet by mouth., Disp: , Rfl:  .  ibuprofen (ADVIL,MOTRIN) 200 MG tablet, Take 200 mg by mouth every 6 (six) hours as needed. , Disp: , Rfl:  .  KLOR-CON M20 20 MEQ tablet, TAKE 1 TABLET BY MOUTH TWICE A DAY, Disp: 60 tablet, Rfl: 0 .  ondansetron (ZOFRAN) 8 MG tablet, Take 1 tablet (8 mg total) by mouth 2 (two) times daily as needed (Nausea or vomiting)., Disp: 30 tablet, Rfl: 1 .  pantoprazole (PROTONIX) 40 MG tablet, Take 1 tablet (40 mg total) by mouth daily., Disp: 90 tablet, Rfl: 1 .  prochlorperazine (COMPAZINE) 10 MG tablet, Take 1 tablet (10 mg total) by mouth every 6 (six) hours as needed (Nausea or vomiting)., Disp: 30 tablet, Rfl: 1 .  tiZANidine (ZANAFLEX) 4 MG tablet, Take 4 mg by mouth every 6 (six) hours as needed. , Disp: , Rfl:  .  valACYclovir (VALTREX) 500 MG tablet, Take 1 tablet (500 mg   total) by mouth daily., Disp: 90 tablet, Rfl: 1 .  valsartan-hydrochlorothiazide (DIOVAN-HCT) 320-25 MG tablet, Take 1/2 tablet po QD, Disp: 90 tablet, Rfl: 2  Physical exam:  Vitals:   05/12/19 1313  BP: 99/76  Pulse: 95  Temp: 98 F (36.7 C)  TempSrc: Tympanic  Weight: (!) 334 lb 3.2 oz (151.6 kg)   Physical Exam Constitutional:      General: He is not in acute distress. HENT:     Head: Normocephalic and atraumatic.  Eyes:     Pupils: Pupils are equal, round, and reactive to light.  Cardiovascular:     Rate and Rhythm: Normal rate and regular rhythm.     Heart sounds: Normal heart sounds.  Pulmonary:     Effort: Pulmonary effort is normal.     Breath sounds: Normal breath sounds.  Abdominal:     General: Bowel sounds are normal.     Palpations: Abdomen is soft.  Musculoskeletal:     Cervical back: Normal range of motion.  Skin:    General: Skin is warm and dry.  Neurological:     Mental  Status: He is alert and oriented to person, place, and time.      CMP Latest Ref Rng & Units 05/12/2019  Glucose 70 - 99 mg/dL 108(H)  BUN 8 - 23 mg/dL 10  Creatinine 0.61 - 1.24 mg/dL 1.00  Sodium 135 - 145 mmol/L 134(L)  Potassium 3.5 - 5.1 mmol/L 4.0  Chloride 98 - 111 mmol/L 102  CO2 22 - 32 mmol/L 25  Calcium 8.9 - 10.3 mg/dL 8.8(L)  Total Protein 6.5 - 8.1 g/dL 7.8  Total Bilirubin 0.3 - 1.2 mg/dL 0.6  Alkaline Phos 38 - 126 U/L 92  AST 15 - 41 U/L 16  ALT 0 - 44 U/L 14   CBC Latest Ref Rng & Units 05/12/2019  WBC 4.0 - 10.5 K/uL 4.2  Hemoglobin 13.0 - 17.0 g/dL 11.3(L)  Hematocrit 39.0 - 52.0 % 36.8(L)  Platelets 150 - 400 K/uL 742(H)     Assessment and plan- Patient is a 78 y.o. male with CMML 1 here for on treatment assessment prior to cycle 9-day 1 of Vidaza  When patient started his treatment he was noted to have an elevated white cell count in the 40s with a hemoglobin between 7-8.  After starting treatment his white cell count has normalized and hemoglobin has improved to 11.  He does have elevated platelet counts and the etiology is unclear.  Typically when we check his CBC a week after his treatment his platelet counts normalized but then go back up to the 600s to 700s when he is due for his treatment.  His last bone marrow biopsy in November 2020 showed response to treatment with no significant blasts and less pronounced monocytic/granulocytic proliferation.  I will therefore plan to continue his Vidaza until progression or toxicity.  I will consider getting a repeat bone marrow biopsy after 3 months.  Patient will continue allopurinol and low-dose aspirin as well as Valtrex prophylaxis.   Visit Diagnosis 1. Encounter for antineoplastic chemotherapy   2. Chronic myelomonocytic leukemia in remission ([facility])      Dr. Archana Rao, MD, MPH [facility] at [facility] [phone] 05/14/2019 8:47 AM                

## 2019-05-15 ENCOUNTER — Inpatient Hospital Stay: Payer: Medicare Other

## 2019-05-15 VITALS — BP 119/63 | HR 98 | Temp 98.1°F | Resp 20

## 2019-05-15 DIAGNOSIS — C9310 Chronic myelomonocytic leukemia not having achieved remission: Secondary | ICD-10-CM

## 2019-05-15 DIAGNOSIS — Z5111 Encounter for antineoplastic chemotherapy: Secondary | ICD-10-CM | POA: Diagnosis not present

## 2019-05-15 MED ORDER — AZACITIDINE CHEMO SQ INJECTION
50.0000 mg/m2 | Freq: Once | INTRAMUSCULAR | Status: AC
  Administered 2019-05-15: 132.5 mg via SUBCUTANEOUS
  Filled 2019-05-15: qty 5.3

## 2019-05-15 MED ORDER — ONDANSETRON HCL 4 MG PO TABS
8.0000 mg | ORAL_TABLET | Freq: Once | ORAL | Status: AC
  Administered 2019-05-15: 8 mg via ORAL
  Filled 2019-05-15: qty 2

## 2019-05-16 ENCOUNTER — Inpatient Hospital Stay: Payer: Medicare Other

## 2019-05-16 VITALS — BP 97/55 | HR 94 | Temp 97.2°F | Resp 18

## 2019-05-16 DIAGNOSIS — Z5111 Encounter for antineoplastic chemotherapy: Secondary | ICD-10-CM | POA: Diagnosis not present

## 2019-05-16 DIAGNOSIS — C9310 Chronic myelomonocytic leukemia not having achieved remission: Secondary | ICD-10-CM | POA: Diagnosis not present

## 2019-05-16 MED ORDER — ONDANSETRON HCL 4 MG PO TABS
8.0000 mg | ORAL_TABLET | Freq: Once | ORAL | Status: AC
  Administered 2019-05-16: 8 mg via ORAL
  Filled 2019-05-16: qty 2

## 2019-05-16 MED ORDER — AZACITIDINE CHEMO SQ INJECTION
50.0000 mg/m2 | Freq: Once | INTRAMUSCULAR | Status: AC
  Administered 2019-05-16: 132.5 mg via SUBCUTANEOUS
  Filled 2019-05-16: qty 5.3

## 2019-05-20 ENCOUNTER — Telehealth: Payer: Self-pay

## 2019-05-20 NOTE — Telephone Encounter (Signed)
CONFIRMED AND SCREENED FOR 05-22-19 OV. 

## 2019-05-22 ENCOUNTER — Encounter: Payer: Self-pay | Admitting: Nurse Practitioner

## 2019-05-22 ENCOUNTER — Ambulatory Visit (INDEPENDENT_AMBULATORY_CARE_PROVIDER_SITE_OTHER): Payer: Medicare Other | Admitting: Nurse Practitioner

## 2019-05-22 ENCOUNTER — Other Ambulatory Visit: Payer: Self-pay

## 2019-05-22 VITALS — BP 121/53 | Temp 97.4°F | Resp 16 | Ht 74.5 in | Wt 328.0 lb

## 2019-05-22 DIAGNOSIS — C9310 Chronic myelomonocytic leukemia not having achieved remission: Secondary | ICD-10-CM | POA: Diagnosis not present

## 2019-05-22 DIAGNOSIS — J069 Acute upper respiratory infection, unspecified: Secondary | ICD-10-CM

## 2019-05-22 DIAGNOSIS — I10 Essential (primary) hypertension: Secondary | ICD-10-CM | POA: Diagnosis not present

## 2019-05-22 MED ORDER — AMOXICILLIN 875 MG PO TABS: 875.0000 mg | ORAL_TABLET | Freq: Two times a day (BID) | ORAL | 0 refills | Status: DC

## 2019-05-22 NOTE — Progress Notes (Signed)
[facility] [place], [place] [address]  Internal MEDICINE  Office Visit Note  Patient Name: Samuel Frank  [id number]  [id number]  Date of Service: 05/31/2019  Chief Complaint  Patient presents with  . Hypertension  . Gastroesophageal Reflux  . Shortness of Breath    states he feels short winded at times b12 helped a little     The patient is here for routine follow up. He states that he recently started having shortness of breath. He states that this started on Sunday. Only bothering him with exertion. He denies congestion, fever, or chills. He denies body aches. He continues to see oncology every few weeks. He has chronic myelomonocytic leukemia.  Last check of blood work showed mild anemia, however, change was not significant. His next treatment is scheduled for 05/09/2019.       Current Medication: Outpatient Encounter Medications as of 05/22/2019  Medication Sig  . allopurinol (ZYLOPRIM) 300 MG tablet TAKE 1 TABLET BY MOUTH EVERY DAY  . aspirin EC 81 MG tablet Take 81 mg by mouth daily.  . colchicine 0.6 MG tablet Take 2 tablets (1.2mg) by mouth at first sign of gout flare followed by 1 tablet (0.6mg) after 1 hour. (Max 1.8mg within 1 hour)  . Cyanocobalamin (B-12 PO) Take 3,000 mcg by mouth daily.  . cyanocobalamin 500 MCG tablet Take 500 mcg by mouth daily.  . cyclobenzaprine (FLEXERIL) 10 MG tablet Take 1 tablet by mouth.  . ibuprofen (ADVIL,MOTRIN) 200 MG tablet Take 200 mg by mouth every 6 (six) hours as needed.   . KLOR-CON M20 20 MEQ tablet TAKE 1 TABLET BY MOUTH TWICE A DAY  . ondansetron (ZOFRAN) 8 MG tablet Take 1 tablet (8 mg total) by mouth 2 (two) times daily as needed (Nausea or vomiting).  . pantoprazole (PROTONIX) 40 MG tablet Take 1 tablet (40 mg total) by mouth daily.  . prochlorperazine (COMPAZINE) 10 MG tablet Take 1 tablet (10 mg total) by mouth every 6 (six) hours as needed (Nausea or vomiting).  . tiZANidine (ZANAFLEX) 4 MG  tablet Take 4 mg by mouth every 6 (six) hours as needed.   . valACYclovir (VALTREX) 500 MG tablet Take 1 tablet (500 mg total) by mouth daily.  . valsartan-hydrochlorothiazide (DIOVAN-HCT) 320-25 MG tablet Take 1/2 tablet po QD  . amoxicillin (AMOXIL) 875 MG tablet Take 1 tablet (875 mg total) by mouth 2 (two) times daily.   No facility-administered encounter medications on file as of 05/22/2019.    Surgical History: Past Surgical History:  Procedure Laterality Date  . COLONOSCOPY WITH PROPOFOL N/A 05/03/2015   Procedure: COLONOSCOPY WITH PROPOFOL;  Surgeon: Paul Y Oh, MD;  Location: [facility] ENDOSCOPY;  Service: Gastroenterology;  Laterality: N/A;  . ESOPHAGOGASTRODUODENOSCOPY (EGD) WITH PROPOFOL N/A 05/03/2015   Procedure: ESOPHAGOGASTRODUODENOSCOPY (EGD) WITH PROPOFOL;  Surgeon: Paul Y Oh, MD;  Location: [facility] ENDOSCOPY;  Service: Gastroenterology;  Laterality: N/A;  . HERNIA REPAIR     19 74    Medical History: Past Medical History:  Diagnosis Date  . Anemia   . CMML (chronic myelomonocytic leukemia) ([place])   . Hypertension     Family History: Family History  Problem Relation Age of Onset  . Alzheimer's disease Father   . Lung cancer Sister   . Cancer Sister     Social History   Socioeconomic History  . Marital status: Divorced    Spouse name: Not on file  . Number of children: 4  . Years of  education: Not on file  . Highest education level: Not on file  Occupational History  . Not on file  Tobacco Use  . Smoking status: Never Smoker  . Smokeless tobacco: Never Used  Substance and Sexual Activity  . Alcohol use: No  . Drug use: No  . Sexual activity: Not on file  Other Topics Concern  . Not on file  Social History Narrative  . Not on file   Social Determinants of Health   Financial Resource Strain: Low Risk   . Difficulty of Paying Living Expenses: Not hard at all  Food Insecurity: No Food Insecurity  . Worried About Charity fundraiser in the Last Year: Never  true  . Ran Out of Food in the Last Year: Never true  Transportation Needs: No Transportation Needs  . Lack of Transportation (Medical): No  . Lack of Transportation (Non-Medical): No  Physical Activity: Sufficiently Active  . Days of Exercise per Week: 3 days  . Minutes of Exercise per Session: 150+ min  Stress: No Stress Concern Present  . Feeling of Stress : Not at all  Social Connections: Somewhat Isolated  . Frequency of Communication with Friends and Family: More than three times a week  . Frequency of Social Gatherings with Friends and Family: More than three times a week  . Attends Religious Services: More than 4 times per year  . Active Member of Clubs or Organizations: No  . Attends Archivist Meetings: Never  . Marital Status: Divorced  Human resources officer Violence:   . Fear of Current or Ex-Partner:   . Emotionally Abused:   [name] Physically Abused:   . Sexually Abused:       Review of Systems  Constitutional: Positive for fatigue. Negative for activity change, chills and unexpected weight change.  HENT: Positive for congestion. Negative for postnasal drip, rhinorrhea, sneezing and sore throat.   Respiratory: Positive for shortness of breath and wheezing. Negative for cough and chest tightness.   Cardiovascular: Negative for chest pain and palpitations.       Continues with lower dose of valsartan/HCTZ. Doing well on lower dose.   Gastrointestinal: Negative for abdominal pain, constipation, diarrhea, nausea and vomiting.  Endocrine: Negative for cold intolerance, heat intolerance, polydipsia and polyuria.  Musculoskeletal: Positive for arthralgias and myalgias. Negative for back pain, joint swelling and neck pain.  Skin: Negative for rash.  Allergic/Immunologic: Negative for environmental allergies.  Neurological: Negative for dizziness, tremors, numbness and headaches.  Hematological: Negative for adenopathy. Does not bruise/bleed easily.   Psychiatric/Behavioral: Negative for behavioral problems (Depression), sleep disturbance and suicidal ideas. The patient is not nervous/anxious.     Today's Vitals   05/22/19 1138  BP: (!) 121/53  Resp: 16  Temp: (!) 97.4 F (36.3 C)  SpO2: 99%  Weight: (!) 328 lb (148.8 kg)  Height: 6' 2.5" (1.892 m)   Body mass index is 41.55 kg/m.  Physical Exam Vitals and nursing note reviewed.  Constitutional:      General: He is not in acute distress.    Appearance: Normal appearance. He is well-developed. He is ill-appearing. He is not diaphoretic.  HENT:     Head: Normocephalic and atraumatic.     Nose: Nose normal.     Mouth/Throat:     Pharynx: No oropharyngeal exudate.  Eyes:     Pupils: Pupils are equal, round, and reactive to light.  Neck:     Thyroid: No thyromegaly.     Vascular: No JVD.  Trachea: No tracheal deviation.  Cardiovascular:     Rate and Rhythm: Normal rate and regular rhythm.     Heart sounds: Normal heart sounds. No murmur. No friction rub. No gallop.   Pulmonary:     Effort: Pulmonary effort is normal. No respiratory distress.     Breath sounds: Wheezing and rhonchi present. No rales.     Comments: There are mild wheezes and rhonchi in bilateral lung fields. He has congested, nonproductive cough noted.  Chest:     Chest wall: No tenderness.  Abdominal:     Palpations: Abdomen is soft.  Musculoskeletal:        General: Normal range of motion.     Cervical back: Normal range of motion and neck supple.  Lymphadenopathy:     Cervical: No cervical adenopathy.  Skin:    General: Skin is warm and dry.  Neurological:     Mental Status: He is alert and oriented to person, place, and time.     Cranial Nerves: No cranial nerve deficit.  Psychiatric:        Behavior: Behavior normal.        Thought Content: Thought content normal.        Judgment: Judgment normal.   Assessment/Plan: 1. Acute upper respiratory infection Start amoxicillin 875mg  twice  daily for 7 days. Rest and increase fluids. May take OTC medications as needed and as indicated for acute symptoms.  - amoxicillin (AMOXIL) 875 MG tablet; Take 1 tablet (875 mg total) by mouth 2 (two) times daily.  Dispense: 14 tablet; Refill: 0  2. Chronic myelomonocytic leukemia not having achieved remission ([place]) Continue regular visits with oncology as scheduled.   3. Essential hypertension, benign Stable. Continue bp medication as prescribed   General Counseling: jerrad piontek understanding of the findings of todays visit and agrees with plan of treatment. I have discussed any further diagnostic evaluation that may be needed or ordered today. We also reviewed his medications today. he has been encouraged to call the office with any questions or concerns that should arise related to todays visit.  Hypertension Counseling:   The following hypertensive lifestyle modification were recommended and discussed:  1. Limiting alcohol intake to less than 1 oz/day of ethanol:(24 oz of beer or 8 oz of wine or 2 oz of 100-proof whiskey). 2. Take baby ASA 81 mg daily. 3. Importance of regular aerobic exercise and losing weight. 4. Reduce dietary saturated fat and cholesterol intake for overall cardiovascular health. 5. Maintaining adequate dietary potassium, calcium, and magnesium intake. 6. Regular monitoring of the blood pressure. 7. Reduce sodium intake to less than 100 mmol/day (less than 2.3 gm of sodium or less than 6 gm of sodium choride)   This patient was seen by [place] with Dr Lavera Guise as a part of collaborative care agreement  Meds ordered this encounter  Medications  . amoxicillin (AMOXIL) 875 MG tablet    Sig: Take 1 tablet (875 mg total) by mouth 2 (two) times daily.    Dispense:  14 tablet    Refill:  0    Order Specific Question:   Supervising Provider    Answer:   Lavera Guise [id number]    Total time spent: 30 Minutes   Time spent includes  review of chart, medications, test results, and follow up plan with the patient.      Dr Lavera Guise Internal medicine

## 2019-05-30 NOTE — Progress Notes (Signed)
Pharmacist Chemotherapy Monitoring - Follow Up Assessment    I verify that I have reviewed each item in the below checklist:  . Regimen for the patient is scheduled for the appropriate day and plan matches scheduled date. [name] Appropriate non-routine labs are ordered dependent on drug ordered. . If applicable, additional medications reviewed and ordered per protocol based on lifetime cumulative doses and/or treatment regimen.   Plan for follow-up and/or issues identified: No . I-vent associated with next due treatment: No . MD and/or nursing notified: No  Ashir Kunz K 05/30/2019 8:37 AM

## 2019-05-31 DIAGNOSIS — J069 Acute upper respiratory infection, unspecified: Secondary | ICD-10-CM | POA: Insufficient documentation

## 2019-05-31 DIAGNOSIS — Z23 Encounter for immunization: Secondary | ICD-10-CM | POA: Diagnosis not present

## 2019-06-01 ENCOUNTER — Other Ambulatory Visit: Payer: Self-pay | Admitting: Oncology

## 2019-06-09 ENCOUNTER — Inpatient Hospital Stay: Payer: Medicare Other | Attending: Oncology

## 2019-06-09 ENCOUNTER — Inpatient Hospital Stay (HOSPITAL_BASED_OUTPATIENT_CLINIC_OR_DEPARTMENT_OTHER): Payer: Medicare Other | Admitting: Oncology

## 2019-06-09 ENCOUNTER — Other Ambulatory Visit: Payer: Self-pay

## 2019-06-09 ENCOUNTER — Inpatient Hospital Stay: Payer: Medicare Other

## 2019-06-09 ENCOUNTER — Encounter: Payer: Self-pay | Admitting: Oncology

## 2019-06-09 VITALS — BP 113/68 | HR 92 | Temp 98.9°F | Ht 74.5 in | Wt 331.0 lb

## 2019-06-09 DIAGNOSIS — D509 Iron deficiency anemia, unspecified: Secondary | ICD-10-CM | POA: Diagnosis not present

## 2019-06-09 DIAGNOSIS — D473 Essential (hemorrhagic) thrombocythemia: Secondary | ICD-10-CM | POA: Diagnosis not present

## 2019-06-09 DIAGNOSIS — I10 Essential (primary) hypertension: Secondary | ICD-10-CM | POA: Diagnosis not present

## 2019-06-09 DIAGNOSIS — D649 Anemia, unspecified: Secondary | ICD-10-CM

## 2019-06-09 DIAGNOSIS — M7989 Other specified soft tissue disorders: Secondary | ICD-10-CM | POA: Diagnosis not present

## 2019-06-09 DIAGNOSIS — C9310 Chronic myelomonocytic leukemia not having achieved remission: Secondary | ICD-10-CM | POA: Diagnosis not present

## 2019-06-09 DIAGNOSIS — D75839 Thrombocytosis, unspecified: Secondary | ICD-10-CM

## 2019-06-09 DIAGNOSIS — Z801 Family history of malignant neoplasm of trachea, bronchus and lung: Secondary | ICD-10-CM | POA: Insufficient documentation

## 2019-06-09 LAB — CBC WITH DIFFERENTIAL/PLATELET
Abs Immature Granulocytes: 0.02 10*3/uL (ref 0.00–0.07)
Basophils Absolute: 0.1 10*3/uL (ref 0.0–0.1)
Basophils Relative: 3 %
Eosinophils Absolute: 0.1 10*3/uL (ref 0.0–0.5)
Eosinophils Relative: 3 %
HCT: 24.9 % — ABNORMAL LOW (ref 39.0–52.0)
Hemoglobin: 7.6 g/dL — ABNORMAL LOW (ref 13.0–17.0)
Immature Granulocytes: 1 %
Lymphocytes Relative: 45 %
Lymphs Abs: 1.7 10*3/uL (ref 0.7–4.0)
MCH: 27 pg (ref 26.0–34.0)
MCHC: 30.5 g/dL (ref 30.0–36.0)
MCV: 88.3 fL (ref 80.0–100.0)
Monocytes Absolute: 0.3 10*3/uL (ref 0.1–1.0)
Monocytes Relative: 7 %
Neutro Abs: 1.5 10*3/uL — ABNORMAL LOW (ref 1.7–7.7)
Neutrophils Relative %: 41 %
Platelets: 855 10*3/uL — ABNORMAL HIGH (ref 150–400)
RBC: 2.82 MIL/uL — ABNORMAL LOW (ref 4.22–5.81)
RDW: 20.7 % — ABNORMAL HIGH (ref 11.5–15.5)
WBC: 3.7 10*3/uL — ABNORMAL LOW (ref 4.0–10.5)
nRBC: 0 % (ref 0.0–0.2)

## 2019-06-09 LAB — RETICULOCYTES
Immature Retic Fract: 24.5 % — ABNORMAL HIGH (ref 2.3–15.9)
RBC.: 2.83 MIL/uL — ABNORMAL LOW (ref 4.22–5.81)
Retic Count, Absolute: 101 10*3/uL (ref 19.0–186.0)
Retic Ct Pct: 3.8 % — ABNORMAL HIGH (ref 0.4–3.1)

## 2019-06-09 LAB — COMPREHENSIVE METABOLIC PANEL
ALT: 12 U/L (ref 0–44)
AST: 13 U/L — ABNORMAL LOW (ref 15–41)
Albumin: 3.9 g/dL (ref 3.5–5.0)
Alkaline Phosphatase: 85 U/L (ref 38–126)
Anion gap: 7 (ref 5–15)
BUN: 13 mg/dL (ref 8–23)
CO2: 24 mmol/L (ref 22–32)
Calcium: 9 mg/dL (ref 8.9–10.3)
Chloride: 104 mmol/L (ref 98–111)
Creatinine, Ser: 1.02 mg/dL (ref 0.61–1.24)
GFR calc Af Amer: >60 mL/min (ref >60)
GFR calc non Af Amer: >60 mL/min (ref >60)
Glucose, Bld: 106 mg/dL — ABNORMAL HIGH (ref 70–99)
Potassium: 4.1 mmol/L (ref 3.5–5.1)
Sodium: 135 mmol/L (ref 135–145)
Total Bilirubin: 0.4 mg/dL (ref 0.3–1.2)
Total Protein: 7.8 g/dL (ref 6.5–8.1)

## 2019-06-09 LAB — IRON AND TIBC
Iron: 13 ug/dL — ABNORMAL LOW (ref 45–182)
Saturation Ratios: 3 % — ABNORMAL LOW (ref 17.9–39.5)
TIBC: 384 ug/dL (ref 250–450)
UIBC: 371 ug/dL

## 2019-06-09 LAB — PATHOLOGIST SMEAR REVIEW

## 2019-06-09 LAB — VITAMIN B12: Vitamin B-12: 784 pg/mL (ref 180–914)

## 2019-06-09 LAB — FOLATE: Folate: 17.3 ng/mL (ref >5.9)

## 2019-06-09 LAB — FERRITIN: Ferritin: 9 ng/mL — ABNORMAL LOW (ref 24–336)

## 2019-06-09 LAB — LACTATE DEHYDROGENASE: LDH: 163 U/L (ref 98–192)

## 2019-06-09 NOTE — Progress Notes (Signed)
Patient stated that he had been doing well with no concerns. 

## 2019-06-10 ENCOUNTER — Inpatient Hospital Stay: Payer: Medicare Other

## 2019-06-10 LAB — HAPTOGLOBIN: Haptoglobin: 120 mg/dL (ref 34–355)

## 2019-06-11 ENCOUNTER — Other Ambulatory Visit: Payer: Self-pay

## 2019-06-11 ENCOUNTER — Inpatient Hospital Stay: Payer: Medicare Other

## 2019-06-11 VITALS — BP 122/63 | HR 91 | Temp 97.7°F | Resp 20

## 2019-06-11 DIAGNOSIS — D509 Iron deficiency anemia, unspecified: Secondary | ICD-10-CM | POA: Diagnosis not present

## 2019-06-11 DIAGNOSIS — C9310 Chronic myelomonocytic leukemia not having achieved remission: Secondary | ICD-10-CM | POA: Diagnosis not present

## 2019-06-11 DIAGNOSIS — D649 Anemia, unspecified: Secondary | ICD-10-CM | POA: Diagnosis not present

## 2019-06-11 DIAGNOSIS — M7989 Other specified soft tissue disorders: Secondary | ICD-10-CM | POA: Diagnosis not present

## 2019-06-11 DIAGNOSIS — D473 Essential (hemorrhagic) thrombocythemia: Secondary | ICD-10-CM | POA: Diagnosis not present

## 2019-06-11 DIAGNOSIS — I10 Essential (primary) hypertension: Secondary | ICD-10-CM | POA: Diagnosis not present

## 2019-06-11 DIAGNOSIS — Z801 Family history of malignant neoplasm of trachea, bronchus and lung: Secondary | ICD-10-CM | POA: Diagnosis not present

## 2019-06-11 MED ORDER — SODIUM CHLORIDE 0.9 % IV SOLN
510.0000 mg | Freq: Once | INTRAVENOUS | Status: AC
  Administered 2019-06-11: 09:00:00 510 mg via INTRAVENOUS
  Filled 2019-06-11: qty 17

## 2019-06-11 MED ORDER — SODIUM CHLORIDE 0.9 % IV SOLN
Freq: Once | INTRAVENOUS | Status: AC
  Filled 2019-06-11: qty 250

## 2019-06-12 ENCOUNTER — Inpatient Hospital Stay: Payer: Medicare Other

## 2019-06-12 NOTE — Progress Notes (Signed)
Hematology/Oncology Consult note [facility]  Telephone:([phone] [phone]  Patient Care Team: Lavera Guise, MD as PCP - General (Internal Medicine)   Name of the patient: Samuel Frank  [id number]  05-08-1940   Date of visit: 06/12/19  Diagnosis- CMML1  Chief complaint/ Reason for visit-routine follow-up of CMML for next cycle of Vidaza  Heme/Onc history: Patient is a 79 yr old male with no significant co morbidities other than hypertension. He has recently been having bilateral knee pain and has undergone steroid injection in his knee.Patient was noted to have a high white count on his routine exam. His white count was 19.2 on 07/22/2018 with an H&H of 11.4/35.1 and a platelet count of 470. At that point he was prescribed a course of antibiotics and a repeat blood work on 08/21/2018 showed white count of 42.3, H&H of 7.1/23 with an MCV of 84 and a platelet count of 791 and hence the patient was referred to hematology for further management.  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 In addition the core biopsy showed a small focus of fibrosis containing scattering of mast cells and eosinophils most suggestive of systemic mastocytosis.bone marrow blasts 7%.Flow cytometry showed increased number of monocytic cells representing 16% of all cells with expression for HLA-DR, CD11b, CD13, CD14, CD33, CD38, CD56 and CD56 and CD64 with [facility] CD 117 are CD34.normal cytogenetics, CBL, SRSF2, SH2B3 and TET2  Repeat bone marrow biopsy after 4 cycles of Vidaza showed less pronounced monocytic/granulocytic proliferation and overall better granulopoiesis and no increase in blasts. In addition features of systemic mastocytosis not present.   Interval history-patient feels well and denies any complaints at this time.  Denies any bleeding in his stool or urine.  Denies any dark melanotic stools  ECOG PS-  1 Pain scale- 0   Review of systems- Review of Systems  Constitutional: Negative for chills, fever, malaise/fatigue and weight loss.  HENT: Negative for congestion, ear discharge and nosebleeds.   Eyes: Negative for blurred vision.  Respiratory: Negative for cough, hemoptysis, sputum production, shortness of breath and wheezing.   Cardiovascular: Negative for chest pain, palpitations, orthopnea and claudication.  Gastrointestinal: Negative for abdominal pain, blood in stool, constipation, diarrhea, heartburn, melena, nausea and vomiting.  Genitourinary: Negative for dysuria, flank pain, frequency, hematuria and urgency.  Musculoskeletal: Negative for back pain, joint pain and myalgias.  Skin: Negative for rash.  Neurological: Negative for dizziness, tingling, focal weakness, seizures, weakness and headaches.  Endo/Heme/Allergies: Does not bruise/bleed easily.  Psychiatric/Behavioral: Negative for depression and suicidal ideas. The patient does not have insomnia.      No Known Allergies   Past Medical History:  Diagnosis Date  . Anemia   . CMML (chronic myelomonocytic leukemia) ([place])   . Hypertension      Past Surgical History:  Procedure Laterality Date  . COLONOSCOPY WITH PROPOFOL N/A 05/03/2015   Procedure: COLONOSCOPY WITH PROPOFOL;  Surgeon: [name], MD;  Location: [facility] ENDOSCOPY;  Service: Gastroenterology;  Laterality: N/A;  . ESOPHAGOGASTRODUODENOSCOPY (EGD) WITH PROPOFOL N/A 05/03/2015   Procedure: ESOPHAGOGASTRODUODENOSCOPY (EGD) WITH PROPOFOL;  Surgeon: [name], MD;  Location: [facility] ENDOSCOPY;  Service: Gastroenterology;  Laterality: N/A;  . HERNIA REPAIR     1975    Social History   Socioeconomic History  . Marital status: Divorced    Spouse name: Not on file  . Number of children: 4  . Years of education: Not on  file  . Highest education level: Not on file  Occupational History  . Not on file  Tobacco Use  . Smoking status: Never Smoker  . Smokeless  tobacco: Never Used  Substance and Sexual Activity  . Alcohol use: No  . Drug use: No  . Sexual activity: Not on file  Other Topics Concern  . Not on file  Social History Narrative  . Not on file   Social Determinants of Health   Financial Resource Strain: Low Risk   . Difficulty of Paying Living Expenses: Not hard at all  Food Insecurity: No Food Insecurity  . Worried About Charity fundraiser in the Last Year: Never true  . Ran Out of Food in the Last Year: Never true  Transportation Needs: No Transportation Needs  . Lack of Transportation (Medical): No  . Lack of Transportation (Non-Medical): No  Physical Activity: Sufficiently Active  . Days of Exercise per Week: 3 days  . Minutes of Exercise per Session: 150+ min  Stress: No Stress Concern Present  . Feeling of Stress : Not at all  Social Connections: Somewhat Isolated  . Frequency of Communication with Friends and Family: More than three times a week  . Frequency of Social Gatherings with Friends and Family: More than three times a week  . Attends Religious Services: More than 4 times per year  . Active Member of Clubs or Organizations: No  . Attends Archivist Meetings: Never  . Marital Status: Divorced  Human resources officer Violence:   . Fear of Current or Ex-Partner:   . Emotionally Abused:   [name] Physically Abused:   . Sexually Abused:     Family History  Problem Relation Age of Onset  . Alzheimer's disease Father   . Lung cancer Sister   . Cancer Sister      Current Outpatient Medications:  .  allopurinol (ZYLOPRIM) 300 MG tablet, TAKE 1 TABLET BY MOUTH EVERY DAY, Disp: 30 tablet, Rfl: 3 .  aspirin EC 81 MG tablet, Take 81 mg by mouth daily., Disp: , Rfl:  .  colchicine 0.6 MG tablet, Take 2 tablets (1.50m) by mouth at first sign of gout flare followed by 1 tablet (0.672m after 1 hour. (Max 1.69m569mithin 1 hour), Disp: , Rfl:  .  Cyanocobalamin (B-12 PO), Take 3,000 mcg by mouth daily., Disp: , Rfl:  .   cyanocobalamin 500 MCG tablet, Take 500 mcg by mouth daily., Disp: , Rfl:  .  cyclobenzaprine (FLEXERIL) 10 MG tablet, Take 1 tablet by mouth., Disp: , Rfl:  .  ibuprofen (ADVIL,MOTRIN) 200 MG tablet, Take 200 mg by mouth every 6 (six) hours as needed. , Disp: , Rfl:  .  KLOR-CON M20 20 MEQ tablet, TAKE 1 TABLET BY MOUTH TWICE A DAY, Disp: 60 tablet, Rfl: 0 .  ondansetron (ZOFRAN) 8 MG tablet, Take 1 tablet (8 mg total) by mouth 2 (two) times daily as needed (Nausea or vomiting)., Disp: 30 tablet, Rfl: 1 .  pantoprazole (PROTONIX) 40 MG tablet, Take 1 tablet (40 mg total) by mouth daily., Disp: 90 tablet, Rfl: 1 .  prochlorperazine (COMPAZINE) 10 MG tablet, Take 1 tablet (10 mg total) by mouth every 6 (six) hours as needed (Nausea or vomiting)., Disp: 30 tablet, Rfl: 1 .  tiZANidine (ZANAFLEX) 4 MG tablet, Take 4 mg by mouth every 6 (six) hours as needed. , Disp: , Rfl:  .  valACYclovir (VALTREX) 500 MG tablet, Take 1 tablet (500 mg total) by mouth  daily., Disp: 90 tablet, Rfl: 1 .  valsartan-hydrochlorothiazide (DIOVAN-HCT) 320-25 MG tablet, Take 1/2 tablet po QD, Disp: 90 tablet, Rfl: 2  Physical exam:  Vitals:   06/09/19 1310  BP: 113/68  Pulse: 92  Temp: 98.9 F (37.2 C)  TempSrc: Tympanic  Weight: (!) 331 lb (150.1 kg)  Height: 6' 2.5" (1.892 m)   Physical Exam Constitutional:      General: He is not in acute distress. HENT:     Head: Normocephalic and atraumatic.  Cardiovascular:     Rate and Rhythm: Normal rate and regular rhythm.     Heart sounds: Normal heart sounds.  Pulmonary:     Effort: Pulmonary effort is normal.     Breath sounds: Normal breath sounds.  Abdominal:     General: Bowel sounds are normal.     Palpations: Abdomen is soft.  Skin:    General: Skin is warm and dry.  Neurological:     Mental Status: He is alert and oriented to person, place, and time.      CMP Latest Ref Rng & Units 06/09/2019  Glucose 70 - 99 mg/dL 106(H)  BUN 8 - 23 mg/dL 13    Creatinine 0.61 - 1.24 mg/dL 1.02  Sodium 135 - 145 mmol/L 135  Potassium 3.5 - 5.1 mmol/L 4.1  Chloride 98 - 111 mmol/L 104  CO2 22 - 32 mmol/L 24  Calcium 8.9 - 10.3 mg/dL 9.0  Total Protein 6.5 - 8.1 g/dL 7.8  Total Bilirubin 0.3 - 1.2 mg/dL 0.4  Alkaline Phos 38 - 126 U/L 85  AST 15 - 41 U/L 13(L)  ALT 0 - 44 U/L 12   CBC Latest Ref Rng & Units 06/09/2019  WBC 4.0 - 10.5 K/uL 3.7(L)  Hemoglobin 13.0 - 17.0 g/dL 7.6(L)  Hematocrit 39.0 - 52.0 % 24.9(L)  Platelets 150 - 400 K/uL 855(H)     Assessment and plan- Patient is a 79 y.o. male with CMML 1.  He is here for on treatment assessment prior to cycle 10-day 1 of Vidaza  After starting Vidaza for his CMML patient's white cell count which was recently elevated in his 20s has normalized.  Also his hemoglobin which was previously between 7-8 went up between 10-11 since starting [place].  However his hemoglobin is down to 7.6 today.  Also he has baseline thrombocytosis on the day of his treatment and when checked after 2 weeks it usually normalizes.  However his platelet count has been generally trending up since December 2020.  His last bone marrow biopsy in November 2020 showed response to treatment.  However in view of his significant anemia and thrombocytosis I will plan to get a repeat bone marrow biopsy today.   I also checked ferritin and iron studies B12 and folate and reticulocyte today.  He does have a low ferritin level of 9.  I will therefore also give him 2 doses of Feraheme.  He has tolerated Feraheme well in the past without any significant side effects.  Pathology review of smear today does not reveal any evidence of circulating blasts.  I will see him after bone marrow biopsy to discuss further management    Visit Diagnosis 1. Normocytic anemia   2. Thrombocytosis ([place])   3. Chronic myelomonocytic leukemia not having achieved remission ([place])   4. Iron deficiency anemia, unspecified iron deficiency anemia type       Dr. Randa Evens, MD, MPH [facility] [phone] 06/12/2019 5:39 AM

## 2019-06-13 ENCOUNTER — Inpatient Hospital Stay: Payer: Medicare Other

## 2019-06-17 ENCOUNTER — Other Ambulatory Visit: Payer: Self-pay | Admitting: Radiology

## 2019-06-17 ENCOUNTER — Inpatient Hospital Stay: Payer: Medicare Other

## 2019-06-17 ENCOUNTER — Other Ambulatory Visit: Payer: Self-pay

## 2019-06-17 VITALS — BP 135/73 | HR 94 | Temp 96.1°F | Resp 18 | Wt 330.0 lb

## 2019-06-17 DIAGNOSIS — C9310 Chronic myelomonocytic leukemia not having achieved remission: Secondary | ICD-10-CM | POA: Diagnosis not present

## 2019-06-17 DIAGNOSIS — Z801 Family history of malignant neoplasm of trachea, bronchus and lung: Secondary | ICD-10-CM | POA: Diagnosis not present

## 2019-06-17 DIAGNOSIS — D649 Anemia, unspecified: Secondary | ICD-10-CM | POA: Diagnosis not present

## 2019-06-17 DIAGNOSIS — D509 Iron deficiency anemia, unspecified: Secondary | ICD-10-CM

## 2019-06-17 DIAGNOSIS — M7989 Other specified soft tissue disorders: Secondary | ICD-10-CM | POA: Diagnosis not present

## 2019-06-17 DIAGNOSIS — I10 Essential (primary) hypertension: Secondary | ICD-10-CM | POA: Diagnosis not present

## 2019-06-17 DIAGNOSIS — D473 Essential (hemorrhagic) thrombocythemia: Secondary | ICD-10-CM | POA: Diagnosis not present

## 2019-06-17 MED ORDER — SODIUM CHLORIDE 0.9 % IV SOLN
510.0000 mg | Freq: Once | INTRAVENOUS | Status: AC
  Administered 2019-06-17: 510 mg via INTRAVENOUS
  Filled 2019-06-17: qty 510

## 2019-06-17 MED ORDER — SODIUM CHLORIDE 0.9 % IV SOLN
Freq: Once | INTRAVENOUS | Status: AC
  Filled 2019-06-17: qty 250

## 2019-06-18 ENCOUNTER — Encounter: Payer: Self-pay | Admitting: Oncology

## 2019-06-18 ENCOUNTER — Ambulatory Visit
Admission: RE | Admit: 2019-06-18 | Discharge: 2019-06-18 | Disposition: A | Discharge status: Home / Self Care | Payer: Medicare Other | Source: Ambulatory Visit | Attending: Oncology | Admitting: Oncology

## 2019-06-18 DIAGNOSIS — Z79899 Other long term (current) drug therapy: Secondary | ICD-10-CM | POA: Insufficient documentation

## 2019-06-18 DIAGNOSIS — I10 Essential (primary) hypertension: Secondary | ICD-10-CM | POA: Diagnosis not present

## 2019-06-18 DIAGNOSIS — D649 Anemia, unspecified: Secondary | ICD-10-CM | POA: Diagnosis not present

## 2019-06-18 DIAGNOSIS — D72821 Monocytosis (symptomatic): Secondary | ICD-10-CM | POA: Diagnosis not present

## 2019-06-18 DIAGNOSIS — C9310 Chronic myelomonocytic leukemia not having achieved remission: Secondary | ICD-10-CM | POA: Insufficient documentation

## 2019-06-18 DIAGNOSIS — Z7982 Long term (current) use of aspirin: Secondary | ICD-10-CM | POA: Insufficient documentation

## 2019-06-18 LAB — CBC WITH DIFFERENTIAL/PLATELET
Abs Immature Granulocytes: 0.3 10*3/uL — ABNORMAL HIGH (ref 0.00–0.07)
Basophils Absolute: 0.1 10*3/uL (ref 0.0–0.1)
Basophils Relative: 1 %
Eosinophils Absolute: 0.4 10*3/uL (ref 0.0–0.5)
Eosinophils Relative: 6 %
HCT: 31 % — ABNORMAL LOW (ref 39.0–52.0)
Hemoglobin: 9.5 g/dL — ABNORMAL LOW (ref 13.0–17.0)
Immature Granulocytes: 4 %
Lymphocytes Relative: 24 %
Lymphs Abs: 1.7 10*3/uL (ref 0.7–4.0)
MCH: 27.1 pg (ref 26.0–34.0)
MCHC: 30.6 g/dL (ref 30.0–36.0)
MCV: 88.6 fL (ref 80.0–100.0)
Monocytes Absolute: 2 10*3/uL — ABNORMAL HIGH (ref 0.1–1.0)
Monocytes Relative: 29 %
Neutro Abs: 2.5 10*3/uL (ref 1.7–7.7)
Neutrophils Relative %: 36 %
Platelets: 250 10*3/uL (ref 150–400)
RBC: 3.5 MIL/uL — ABNORMAL LOW (ref 4.22–5.81)
RDW: 24.2 % — ABNORMAL HIGH (ref 11.5–15.5)
Smear Review: NORMAL
WBC: 7 10*3/uL (ref 4.0–10.5)
nRBC: 1.1 % — ABNORMAL HIGH (ref 0.0–0.2)

## 2019-06-18 LAB — PROTIME-INR
INR: 1.1 (ref 0.8–1.2)
Prothrombin Time: 14.1 seconds (ref 11.4–15.2)

## 2019-06-18 MED ORDER — FENTANYL CITRATE (PF) 100 MCG/2ML IJ SOLN
INTRAMUSCULAR | Status: AC | PRN
  Administered 2019-06-18 (×2): 50 ug via INTRAVENOUS

## 2019-06-18 MED ORDER — MIDAZOLAM HCL 2 MG/2ML IJ SOLN
INTRAMUSCULAR | Status: AC
  Filled 2019-06-18: qty 2

## 2019-06-18 MED ORDER — FENTANYL CITRATE (PF) 100 MCG/2ML IJ SOLN
INTRAMUSCULAR | Status: AC
  Filled 2019-06-18: qty 2

## 2019-06-18 MED ORDER — MIDAZOLAM HCL 2 MG/2ML IJ SOLN
INTRAMUSCULAR | Status: AC | PRN
  Administered 2019-06-18 (×2): 1 mg via INTRAVENOUS

## 2019-06-18 MED ORDER — HEPARIN SOD (PORK) LOCK FLUSH 100 UNIT/ML IV SOLN
INTRAVENOUS | Status: AC
  Filled 2019-06-18: qty 5

## 2019-06-18 MED ORDER — SODIUM CHLORIDE 0.9 % IV SOLN: INTRAVENOUS | Status: DC

## 2019-06-18 NOTE — H&P (Signed)
Chief Complaint: Patient was seen in consultation today for CT-guided bone marrow biopsy at the request of Rao,Archana C  Referring Physician(s): Rao,Archana C  Patient Status: [facility] - Out-pt  History of Present Illness: Samuel Frank. is a 79 y.o. male with history of chronic myelomonocytic leukemia.  Patient presents for CT-guided bone marrow biopsy.  He has had previous bone marrow biopsies, the last biopsy was performed on 01/07/2019.  Repeat bone marrow biopsy has been requested to assess response to treatment and direct further management.  Patient has no complaints today.  He has completed his COVID-19 vaccines.  Past Medical History:  Diagnosis Date  . Anemia   . CMML (chronic myelomonocytic leukemia) ([place])   . Hypertension     Past Surgical History:  Procedure Laterality Date  . COLONOSCOPY WITH PROPOFOL N/A 05/03/2015   Procedure: COLONOSCOPY WITH PROPOFOL;  Surgeon: [name], MD;  Location: [facility] ENDOSCOPY;  Service: Gastroenterology;  Laterality: N/A;  . ESOPHAGOGASTRODUODENOSCOPY (EGD) WITH PROPOFOL N/A 05/03/2015   Procedure: ESOPHAGOGASTRODUODENOSCOPY (EGD) WITH PROPOFOL;  Surgeon: [name], MD;  Location: [facility] ENDOSCOPY;  Service: Gastroenterology;  Laterality: N/A;  . HERNIA REPAIR     1975    Allergies: Patient has no known allergies.  Medications: Prior to Admission medications   Medication Sig Start Date End Date Taking? Authorizing Provider  allopurinol (ZYLOPRIM) 300 MG tablet TAKE 1 TABLET BY MOUTH EVERY DAY 02/20/19  Yes Sindy Guadeloupe, MD  aspirin EC 81 MG tablet Take 81 mg by mouth daily.   Yes [provider]  colchicine 0.6 MG tablet Take 2 tablets (1.31m) by mouth at first sign of gout flare followed by 1 tablet (0.632m after 1 hour. (Max 1.85m65mithin 1 hour) 03/21/18  Yes [provider]  Cyanocobalamin (B-12 PO) Take 3,000 mcg by mouth daily.   Yes [provider]  cyclobenzaprine (FLEXERIL) 10 MG tablet Take 1  tablet by mouth. 11/13/18  Yes [provider]  ibuprofen (ADVIL,MOTRIN) 200 MG tablet Take 200 mg by mouth every 6 (six) hours as needed.    Yes [provider]  KLOR-CON M20 20 MEQ tablet TAKE 1 TABLET BY MOUTH TWICE A DAY 06/01/19  Yes RaoSindy GuadeloupeD  pantoprazole (PROTONIX) 40 MG tablet Take 1 tablet (40 mg total) by mouth daily. 02/21/19  Yes Boscia, HeaGreer EeP  valACYclovir (VALTREX) 500 MG tablet Take 1 tablet (500 mg total) by mouth daily. 03/20/19  Yes RaoSindy GuadeloupeD  valsartan-hydrochlorothiazide (DIOVAN-HCT) 320-25 MG tablet Take 1/2 tablet po QD 04/21/19  Yes Boscia, Heather E, NP  cyanocobalamin 500 MCG tablet Take 500 mcg by mouth daily.    [provider]  ondansetron (ZOFRAN) 8 MG tablet Take 1 tablet (8 mg total) by mouth 2 (two) times daily as needed (Nausea or vomiting). Patient not taking: Reported on 06/18/2019 09/13/18   RaoSindy GuadeloupeD  prochlorperazine (COMPAZINE) 10 MG tablet Take 1 tablet (10 mg total) by mouth every 6 (six) hours as needed (Nausea or vomiting). 09/13/18   RaoSindy GuadeloupeD  tiZANidine (ZANAFLEX) 4 MG tablet Take 4 mg by mouth every 6 (six) hours as needed.  03/13/17   [provider]     Family History  Problem Relation Age of Onset  . Alzheimer's disease Father   . Lung cancer Sister   . Cancer Sister     Social History   Socioeconomic History  . Marital status: Divorced  Spouse name: Not on file  . Number of children: 4  . Years of education: Not on file  . Highest education level: Not on file  Occupational History  . Occupation: retired    Comment: [employer]  Tobacco Use  . Smoking status: Never Smoker  . Smokeless tobacco: Never Used  Substance and Sexual Activity  . Alcohol use: No  . Drug use: No  . Sexual activity: Not on file  Other Topics Concern  . Not on file  Social History Narrative  . Not on file   Social Determinants of Health   Financial Resource Strain:  Low Risk   . Difficulty of Paying Living Expenses: Not hard at all  Food Insecurity: No Food Insecurity  . Worried About Charity fundraiser in the Last Year: Never true  . Ran Out of Food in the Last Year: Never true  Transportation Needs: No Transportation Needs  . Lack of Transportation (Medical): No  . Lack of Transportation (Non-Medical): No  Physical Activity: Sufficiently Active  . Days of Exercise per Week: 3 days  . Minutes of Exercise per Session: 150+ min  Stress: No Stress Concern Present  . Feeling of Stress : Not at all  Social Connections: Somewhat Isolated  . Frequency of Communication with Friends and Family: More than three times a week  . Frequency of Social Gatherings with Friends and Family: More than three times a week  . Attends Religious Services: More than 4 times per year  . Active Member of Clubs or Organizations: No  . Attends Archivist Meetings: Never  . Marital Status: Divorced     Review of Systems: A 12 point ROS discussed and pertinent positives are indicated in the HPI above.  All other systems are negative.  Review of Systems  Constitutional: Negative.   Respiratory: Negative.   Cardiovascular: Negative.   Gastrointestinal: Negative.   Genitourinary: Negative.   Neurological: Negative.     Vital Signs: Temp 98.1 F (36.7 C) (Oral)   Resp 20   Ht 6' 2.5" (1.892 m)   Wt (!) 149.7 kg   SpO2 97%   BMI 41.80 kg/m   Physical Exam Vitals reviewed.  Constitutional:      Appearance: He is not ill-appearing.  Cardiovascular:     Rate and Rhythm: Normal rate and regular rhythm.     Heart sounds: Normal heart sounds.  Pulmonary:     Effort: Pulmonary effort is normal.     Breath sounds: Normal breath sounds.  Abdominal:     General: Abdomen is flat. Bowel sounds are normal.     Palpations: Abdomen is soft.  Neurological:     Mental Status: He is alert.     Imaging: No results found.  Labs:  CBC: Recent Labs     03/17/19 0948 04/14/19 1250 05/12/19 1245 06/09/19 1241  WBC 5.0 5.8 4.2 3.7*  HGB 11.8* 11.6* 11.3* 7.6*  HCT 38.8* 37.4* 36.8* 24.9*  PLT 741* 671* 742* 855*    COAGS: Recent Labs    09/04/18 0809 01/07/19 0735 06/18/19 0734  INR 1.2 1.1 1.1    BMP: Recent Labs    03/17/19 0948 04/14/19 1250 05/12/19 1245 06/09/19 1241  NA 134* 135 134* 135  K 4.3 4.5 4.0 4.1  CL 102 102 102 104  CO2 _0 GLUCOSE 126* 107* 108* 106*  BUN 24* _1 CALCIUM 9.1 9.2 8.8* 9.0  CREATININE 0.97  1.12 1.00 1.02  GFRNONAA >60 >60 >60 >60  GFRAA >60 >60 >60 >60    LIVER FUNCTION TESTS: Recent Labs    03/17/19 0948 04/14/19 1250 05/12/19 1245 06/09/19 1241  BILITOT 0.6 0.7 0.6 0.4  AST _0 13*  ALT _1 ALKPHOS 94 97 92 85  PROT 8.2* 8.4* 7.8 7.8  ALBUMIN 4.1 4.2 4.0 3.9    TUMOR MARKERS: No results for input(s): AFPTM, CEA, CA199, CHROMGRNA in the last 8760 hours.  Assessment and Plan:   79 year old with history of chronic myelomonocytic leukemia.  Patient presents for CT-guided bone marrow biopsy to direct further management.  Patient has no complaints today.  Patient has had previous bone marrow biopsies and has no questions about the procedure.  Plan for CT-guided bone marrow biopsy with moderate sedation.  Risks and benefits of bone marrow biopsy was discussed with the patient and/or patient's family including, but not limited to bleeding, infection, damage to adjacent structures or low yield requiring additional tests.  All of the questions were answered and there is agreement to proceed.  Consent signed and in chart.   Thank you for this interesting consult.  I greatly enjoyed meeting Ekansh Sherk. and look forward to participating in their care.  A copy of this report was sent to the requesting provider on this date.  Electronically Signed: Burman Riis, MD 06/18/2019, 8:14 AM   I spent a total of   10 Minutes in face to face in  clinical consultation, greater than 50% of which was counseling/coordinating care for CT-guided bone marrow biopsy.

## 2019-06-18 NOTE — Procedures (Signed)
Interventional Radiology Procedure:   Indications: CMML and assess for response to therapy.  Procedure: CT guided bone marrow biopsy  Findings: 2 aspirates and 2 cores from right ilium  Complications: None     EBL: Minimal, less than 10 ml  Plan: Discharge to home in one hour.   Ovadia Lopp R. Anselm Pancoast, MD  Pager: [phone]

## 2019-06-18 NOTE — Progress Notes (Signed)
Dr.Henn at bedside, speaking with pt. Pt. Verbalizes understanding of conversation.

## 2019-06-19 ENCOUNTER — Other Ambulatory Visit: Payer: Self-pay

## 2019-06-23 ENCOUNTER — Other Ambulatory Visit: Payer: Self-pay | Admitting: Oncology

## 2019-06-25 ENCOUNTER — Encounter (HOSPITAL_COMMUNITY): Payer: Self-pay | Admitting: Oncology

## 2019-06-25 LAB — SURGICAL PATHOLOGY

## 2019-07-01 ENCOUNTER — Inpatient Hospital Stay (HOSPITAL_BASED_OUTPATIENT_CLINIC_OR_DEPARTMENT_OTHER): Payer: Medicare Other | Admitting: Oncology

## 2019-07-01 ENCOUNTER — Other Ambulatory Visit: Payer: Self-pay

## 2019-07-01 VITALS — BP 136/66 | HR 93 | Temp 97.4°F | Resp 18 | Wt 331.6 lb

## 2019-07-01 DIAGNOSIS — I10 Essential (primary) hypertension: Secondary | ICD-10-CM | POA: Diagnosis not present

## 2019-07-01 DIAGNOSIS — M7989 Other specified soft tissue disorders: Secondary | ICD-10-CM

## 2019-07-01 DIAGNOSIS — C9310 Chronic myelomonocytic leukemia not having achieved remission: Secondary | ICD-10-CM

## 2019-07-01 DIAGNOSIS — D473 Essential (hemorrhagic) thrombocythemia: Secondary | ICD-10-CM | POA: Diagnosis not present

## 2019-07-01 DIAGNOSIS — Z801 Family history of malignant neoplasm of trachea, bronchus and lung: Secondary | ICD-10-CM | POA: Diagnosis not present

## 2019-07-01 DIAGNOSIS — D649 Anemia, unspecified: Secondary | ICD-10-CM | POA: Diagnosis not present

## 2019-07-01 DIAGNOSIS — D509 Iron deficiency anemia, unspecified: Secondary | ICD-10-CM | POA: Diagnosis not present

## 2019-07-01 NOTE — Progress Notes (Signed)
Patient here for oncology follow-up appointment, expresses concerns of right foot swelling.

## 2019-07-02 NOTE — Progress Notes (Signed)
Pharmacist Chemotherapy Monitoring - Follow Up Assessment    I verify that I have reviewed each item in the below checklist:  . Regimen for the patient is scheduled for the appropriate day and plan matches scheduled date. [name] Appropriate non-routine labs are ordered dependent on drug ordered. . If applicable, additional medications reviewed and ordered per protocol based on lifetime cumulative doses and/or treatment regimen.   Plan for follow-up and/or issues identified: Yes . I-vent associated with next due treatment: No . MD and/or nursing notified: Yes - no orders   Samuel Frank 07/02/2019 9:20 AM

## 2019-07-03 ENCOUNTER — Ambulatory Visit
Admission: RE | Admit: 2019-07-03 | Discharge: 2019-07-03 | Disposition: A | Discharge status: Home / Self Care | Payer: Medicare Other | Source: Ambulatory Visit | Attending: Oncology | Admitting: Oncology

## 2019-07-03 ENCOUNTER — Other Ambulatory Visit: Payer: Self-pay

## 2019-07-03 DIAGNOSIS — M7989 Other specified soft tissue disorders: Secondary | ICD-10-CM | POA: Diagnosis not present

## 2019-07-03 DIAGNOSIS — R600 Localized edema: Secondary | ICD-10-CM | POA: Diagnosis not present

## 2019-07-04 NOTE — Progress Notes (Signed)
Hematology/Oncology Consult note [facility]  [phone] [phone]  Patient Care Team: Lavera Guise, MD as PCP - General (Internal Medicine) Sindy Guadeloupe, MD as Consulting Physician (Oncology)   Name of the patient: Samuel Frank  [id number]  02-13-1941   Date of visit: 07/04/19  Diagnosis-CMML 1  Chief complaint/ Reason for visit-discuss bone marrow results and further management  Heme/Onc history: Patient is a 79 yr old male with no significant co morbidities other than hypertension. He has recently been having bilateral knee pain and has undergone steroid injection in his knee.Patient was noted to have a high white count on his routine exam. His white count was 19.2 on 07/22/2018 with an H&H of 11.4/35.1 and a platelet count of 470. At that point he was prescribed a course of antibiotics and a repeat blood work on 08/21/2018 showed white count of 42.3, H&H of 7.1/23 with an MCV of 84 and a platelet count of 791 and hence the patient was referred to hematology for further management.  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 In addition the core biopsy showed a small focus of fibrosis containing scattering of mast cells and eosinophils most suggestive of systemic mastocytosis.bone marrow blasts 7%.Flow cytometry showed increased number of monocytic cells representing 16% of all cells with expression for HLA-DR, CD11b, CD13, CD14, CD33, CD38, CD56 and CD56 and CD64 with [facility] CD 117 are CD34.normal cytogenetics, CBL, SRSF2, SH2B3 and TET2  Repeat bone marrow biopsy after 4 cycles of Vidaza showed less pronounced monocytic/granulocytic proliferation and overall better granulopoiesis and no increase in blasts. In addition features of systemic mastocytosis not present.  Interval history-reports that his right lower extremity has been swollen for about a week.  Denies other  complaints at this time1  ECOG PS- 1 Pain scale- 0   Review of systems- Review of Systems  Constitutional: Negative for chills, fever, malaise/fatigue and weight loss.  HENT: Negative for congestion, ear discharge and nosebleeds.   Eyes: Negative for blurred vision.  Respiratory: Negative for cough, hemoptysis, sputum production, shortness of breath and wheezing.   Cardiovascular: Positive for leg swelling. Negative for chest pain, palpitations, orthopnea and claudication.  Gastrointestinal: Negative for abdominal pain, blood in stool, constipation, diarrhea, heartburn, melena, nausea and vomiting.  Genitourinary: Negative for dysuria, flank pain, frequency, hematuria and urgency.  Musculoskeletal: Negative for back pain, joint pain and myalgias.  Skin: Negative for rash.  Neurological: Negative for dizziness, tingling, focal weakness, seizures, weakness and headaches.  Endo/Heme/Allergies: Does not bruise/bleed easily.  Psychiatric/Behavioral: Negative for depression and suicidal ideas. The patient does not have insomnia.       No Known Allergies   Past Medical History:  Diagnosis Date  . Anemia   . CMML (chronic myelomonocytic leukemia) ([place])   . Hypertension      Past Surgical History:  Procedure Laterality Date  . COLONOSCOPY WITH PROPOFOL N/A 05/03/2015   Procedure: COLONOSCOPY WITH PROPOFOL;  Surgeon: [name], MD;  Location: [facility] ENDOSCOPY;  Service: Gastroenterology;  Laterality: N/A;  . ESOPHAGOGASTRODUODENOSCOPY (EGD) WITH PROPOFOL N/A 05/03/2015   Procedure: ESOPHAGOGASTRODUODENOSCOPY (EGD) WITH PROPOFOL;  Surgeon: [name], MD;  Location: [facility] ENDOSCOPY;  Service: Gastroenterology;  Laterality: N/A;  . HERNIA REPAIR     1975    Social History   Socioeconomic History  . Marital status: Divorced    Spouse name: Not on file  . Number of children: 4  .  Years of education: Not on file  . Highest education level: Not on file  Occupational History  .  Occupation: retired    Comment: [employer]  Tobacco Use  . Smoking status: Never Smoker  . Smokeless tobacco: Never Used  Substance and Sexual Activity  . Alcohol use: No  . Drug use: No  . Sexual activity: Not on file  Other Topics Concern  . Not on file  Social History Narrative  . Not on file   Social Determinants of Health   Financial Resource Strain: Low Risk   . Difficulty of Paying Living Expenses: Not hard at all  Food Insecurity: No Food Insecurity  . Worried About Charity fundraiser in the Last Year: Never true  . Ran Out of Food in the Last Year: Never true  Transportation Needs: No Transportation Needs  . Lack of Transportation (Medical): No  . Lack of Transportation (Non-Medical): No  Physical Activity: Sufficiently Active  . Days of Exercise per Week: 3 days  . Minutes of Exercise per Session: 150+ min  Stress: No Stress Concern Present  . Feeling of Stress : Not at all  Social Connections: Somewhat Isolated  . Frequency of Communication with Friends and Family: More than three times a week  . Frequency of Social Gatherings with Friends and Family: More than three times a week  . Attends Religious Services: More than 4 times per year  . Active Member of Clubs or Organizations: No  . Attends Archivist Meetings: Never  . Marital Status: Divorced  Human resources officer Violence:   . Fear of Current or Ex-Partner:   . Emotionally Abused:   [name] Physically Abused:   . Sexually Abused:     Family History  Problem Relation Age of Onset  . Alzheimer's disease Father   . Lung cancer Sister   . Cancer Sister      Current Outpatient Medications:  .  allopurinol (ZYLOPRIM) 300 MG tablet, TAKE 1 TABLET BY MOUTH EVERY DAY, Disp: 30 tablet, Rfl: 3 .  aspirin EC 81 MG tablet, Take 81 mg by mouth daily., Disp: , Rfl:  .  colchicine 0.6 MG tablet, Take 2 tablets (1.7m) by mouth at first sign of gout flare followed by 1 tablet (0.684m after 1 hour.  (Max 1.71m39mithin 1 hour), Disp: , Rfl:  .  Cyanocobalamin (B-12 PO), Take 3,000 mcg by mouth daily., Disp: , Rfl:  .  cyanocobalamin 500 MCG tablet, Take 500 mcg by mouth daily., Disp: , Rfl:  .  cyclobenzaprine (FLEXERIL) 10 MG tablet, Take 1 tablet by mouth., Disp: , Rfl:  .  ibuprofen (ADVIL,MOTRIN) 200 MG tablet, Take 200 mg by mouth every 6 (six) hours as needed. , Disp: , Rfl:  .  KLOR-CON M20 20 MEQ tablet, TAKE 1 TABLET BY MOUTH TWICE A DAY, Disp: 60 tablet, Rfl: 0 .  ondansetron (ZOFRAN) 8 MG tablet, Take 1 tablet (8 mg total) by mouth 2 (two) times daily as needed (Nausea or vomiting)., Disp: 30 tablet, Rfl: 1 .  pantoprazole (PROTONIX) 40 MG tablet, Take 1 tablet (40 mg total) by mouth daily., Disp: 90 tablet, Rfl: 1 .  prochlorperazine (COMPAZINE) 10 MG tablet, Take 1 tablet (10 mg total) by mouth every 6 (six) hours as needed (Nausea or vomiting)., Disp: 30 tablet, Rfl: 1 .  tiZANidine (ZANAFLEX) 4 MG tablet, Take 4 mg by mouth every 6 (six) hours as needed. , Disp: , Rfl:  .  valACYclovir (  VALTREX) 500 MG tablet, Take 1 tablet (500 mg total) by mouth daily., Disp: 90 tablet, Rfl: 1 .  valsartan-hydrochlorothiazide (DIOVAN-HCT) 320-25 MG tablet, Take 1/2 tablet po QD, Disp: 90 tablet, Rfl: 2  Physical exam:  Vitals:   07/01/19 1058  BP: 136/66  Pulse: 93  Resp: 18  Temp: (!) 97.4 F (36.3 C)  TempSrc: Tympanic  SpO2: 99%  Weight: (!) 331 lb 9.6 oz (150.4 kg)   Physical Exam HENT:     Head: Normocephalic and atraumatic.  Eyes:     Pupils: Pupils are equal, round, and reactive to light.  Cardiovascular:     Rate and Rhythm: Normal rate and regular rhythm.     Heart sounds: Normal heart sounds.  Pulmonary:     Effort: Pulmonary effort is normal.     Breath sounds: Normal breath sounds.  Abdominal:     General: Bowel sounds are normal.     Palpations: Abdomen is soft.  Musculoskeletal:     Cervical back: Normal range of motion.     Comments: Right leg swelling  predominantly ankle edema  Skin:    General: Skin is warm and dry.  Neurological:     Mental Status: He is alert and oriented to person, place, and time.      CMP Latest Ref Rng & Units 06/09/2019  Glucose 70 - 99 mg/dL 106(H)  BUN 8 - 23 mg/dL 13  Creatinine 0.61 - 1.24 mg/dL 1.02  Sodium 135 - 145 mmol/L 135  Potassium 3.5 - 5.1 mmol/L 4.1  Chloride 98 - 111 mmol/L 104  CO2 22 - 32 mmol/L 24  Calcium 8.9 - 10.3 mg/dL 9.0  Total Protein 6.5 - 8.1 g/dL 7.8  Total Bilirubin 0.3 - 1.2 mg/dL 0.4  Alkaline Phos 38 - 126 U/L 85  AST 15 - 41 U/L 13(L)  ALT 0 - 44 U/L 12   CBC Latest Ref Rng & Units 06/18/2019  WBC 4.0 - 10.5 K/uL 7.0  Hemoglobin 13.0 - 17.0 g/dL 9.5(L)  Hematocrit 39.0 - 52.0 % 31.0(L)  Platelets 150 - 400 K/uL 250    No images are attached to the encounter.  US Venous Img Lower Unilateral Right  Result Date: 07/03/2019 CLINICAL DATA:  Right lower extremity edema. EXAM: RIGHT LOWER EXTREMITY VENOUS DOPPLER ULTRASOUND TECHNIQUE: Gray-scale sonography with graded compression, as well as color Doppler and duplex ultrasound were performed to evaluate the lower extremity deep venous systems from the level of the common femoral vein and including the common femoral, femoral, profunda femoral, popliteal and calf veins including the posterior tibial, peroneal and gastrocnemius veins when visible. The superficial great saphenous vein was also interrogated. Spectral Doppler was utilized to evaluate flow at rest and with distal augmentation maneuvers in the common femoral, femoral and popliteal veins. COMPARISON:  None. FINDINGS: Contralateral Common Femoral Vein: Respiratory phasicity is normal and symmetric with the symptomatic side. No evidence of thrombus. Normal compressibility. Common Femoral Vein: No evidence of thrombus. Normal compressibility, respiratory phasicity and response to augmentation. Saphenofemoral Junction: No evidence of thrombus. Normal compressibility and flow  on color Doppler imaging. Profunda Femoral Vein: No evidence of thrombus. Normal compressibility and flow on color Doppler imaging. Femoral Vein: No evidence of thrombus. Normal compressibility, respiratory phasicity and response to augmentation. Popliteal Vein: No evidence of thrombus. Normal compressibility, respiratory phasicity and response to augmentation. Calf Veins: No evidence of thrombus. Normal compressibility and flow on color Doppler imaging. Superficial Great Saphenous Vein: No evidence of thrombus. Normal  compressibility. Venous Reflux:  None. Other Findings: No evidence of superficial thrombophlebitis or abnormal fluid collection. IMPRESSION: No evidence of right lower extremity deep venous thrombosis. Electronically Signed   By: Aletta Edouard M.D.   On: 07/03/2019 16:37   CT BONE MARROW BIOPSY & ASPIRATION  Result Date: 06/18/2019 INDICATION: 79 year old with chronic myelomonocytic leukemia. Evaluate response to therapy. EXAM: CT GUIDED BONE MARROW ASPIRATES AND BIOPSY Physician: Stephan Minister. Anselm Pancoast, MD MEDICATIONS: None. ANESTHESIA/SEDATION: Fentanyl 100 mcg IV; Versed 2.0 mg IV Moderate Sedation Time:  25 minutes The patient was continuously monitored during the procedure by the interventional radiology nurse under my direct supervision. COMPLICATIONS: None immediate. PROCEDURE: The procedure was explained to the patient. The risks and benefits of the procedure were discussed and the patient's questions were addressed. Informed consent was obtained from the patient. The patient was placed prone on CT table. Images of the pelvis were obtained. The right side of back was prepped and draped in sterile fashion. The skin and right posterior ilium were anesthetized with 1% lidocaine. 11 gauge bone needle was directed into the right ilium with CT guidance. Two aspirates and two core biopsies were obtained. Bandage placed over the puncture site. FINDINGS: Biopsy needle was successfully advanced into the  posterior right ilium. IMPRESSION: CT guided bone marrow aspiration and core biopsy. Electronically Signed   By: Markus Daft M.D.   On: 06/18/2019 12:21     Assessment and plan- Patient is a 79 y.o. male with CMML 1.  He is here to discuss bone marrow results and further management  Repeat bone marrow biopsy was done as patient was found to have significant thrombocytosis with a platelet count of 855 and a hemoglobin of 7.6 on his prior visit on 06/09/2019.  He was found to have evidence of iron deficiency as well and received 2 more doses of Feraheme.Repeat CBC on the day of his bone marrow biopsy showed improvement of his hemoglobin to 9.5 and his platelets have gone back down to 50.  Bone marrow biopsy at this time shows no concerning findings of progression.  No evidence of increased blasts in the bone marrow.  Findings were overall consistent with persistent CMML.  I will therefore plan to resume his Vidaza starting next week.  Right lower extremity swelling: It is predominantly ankle edema but I will get a right lower extremity ultrasound to rule out DVT.   CBC with differential CMP in 1 week and see Dr. Janese Banks to restart Vidaza   Visit Diagnosis 1. Swelling of lower extremity   2. Chronic myelomonocytic leukemia not having achieved remission ([place])      Dr. Randa Evens, MD, MPH [facility] [phone] 07/04/2019 2:57 PM

## 2019-07-05 ENCOUNTER — Other Ambulatory Visit: Payer: Self-pay | Admitting: Oncology

## 2019-07-07 ENCOUNTER — Inpatient Hospital Stay: Payer: Medicare Other

## 2019-07-07 ENCOUNTER — Other Ambulatory Visit: Payer: Self-pay

## 2019-07-07 ENCOUNTER — Encounter: Payer: Self-pay | Admitting: Oncology

## 2019-07-07 ENCOUNTER — Inpatient Hospital Stay (HOSPITAL_BASED_OUTPATIENT_CLINIC_OR_DEPARTMENT_OTHER): Payer: Medicare Other | Admitting: Oncology

## 2019-07-07 ENCOUNTER — Inpatient Hospital Stay: Payer: Medicare Other | Attending: Oncology

## 2019-07-07 VITALS — BP 118/60 | HR 97 | Temp 95.9°F | Resp 16 | Wt 325.1 lb

## 2019-07-07 DIAGNOSIS — D473 Essential (hemorrhagic) thrombocythemia: Secondary | ICD-10-CM | POA: Diagnosis not present

## 2019-07-07 DIAGNOSIS — Z5111 Encounter for antineoplastic chemotherapy: Secondary | ICD-10-CM | POA: Insufficient documentation

## 2019-07-07 DIAGNOSIS — D649 Anemia, unspecified: Secondary | ICD-10-CM

## 2019-07-07 DIAGNOSIS — C9310 Chronic myelomonocytic leukemia not having achieved remission: Secondary | ICD-10-CM | POA: Diagnosis not present

## 2019-07-07 DIAGNOSIS — D75839 Thrombocytosis, unspecified: Secondary | ICD-10-CM

## 2019-07-07 LAB — COMPREHENSIVE METABOLIC PANEL
ALT: 12 U/L (ref 0–44)
AST: 15 U/L (ref 15–41)
Albumin: 4 g/dL (ref 3.5–5.0)
Alkaline Phosphatase: 87 U/L (ref 38–126)
Anion gap: 8 (ref 5–15)
BUN: 11 mg/dL (ref 8–23)
CO2: 26 mmol/L (ref 22–32)
Calcium: 8.9 mg/dL (ref 8.9–10.3)
Chloride: 103 mmol/L (ref 98–111)
Creatinine, Ser: 1.15 mg/dL (ref 0.61–1.24)
GFR calc Af Amer: >60 mL/min (ref >60)
GFR calc non Af Amer: 60 mL/min — ABNORMAL LOW (ref >60)
Glucose, Bld: 115 mg/dL — ABNORMAL HIGH (ref 70–99)
Potassium: 4.2 mmol/L (ref 3.5–5.1)
Sodium: 137 mmol/L (ref 135–145)
Total Bilirubin: 0.4 mg/dL (ref 0.3–1.2)
Total Protein: 7.8 g/dL (ref 6.5–8.1)

## 2019-07-07 LAB — CBC WITH DIFFERENTIAL/PLATELET
Abs Immature Granulocytes: 0.86 10*3/uL — ABNORMAL HIGH (ref 0.00–0.07)
Basophils Absolute: 0.1 10*3/uL (ref 0.0–0.1)
Basophils Relative: 0 %
Eosinophils Absolute: 0.1 10*3/uL (ref 0.0–0.5)
Eosinophils Relative: 0 %
HCT: 35.3 % — ABNORMAL LOW (ref 39.0–52.0)
Hemoglobin: 10.6 g/dL — ABNORMAL LOW (ref 13.0–17.0)
Immature Granulocytes: 4 %
Lymphocytes Relative: 10 %
Lymphs Abs: 2 10*3/uL (ref 0.7–4.0)
MCH: 27.3 pg (ref 26.0–34.0)
MCHC: 30 g/dL (ref 30.0–36.0)
MCV: 91 fL (ref 80.0–100.0)
Monocytes Absolute: 3.8 10*3/uL — ABNORMAL HIGH (ref 0.1–1.0)
Monocytes Relative: 19 %
Neutro Abs: 13.5 10*3/uL — ABNORMAL HIGH (ref 1.7–7.7)
Neutrophils Relative %: 67 %
Platelets: 419 10*3/uL — ABNORMAL HIGH (ref 150–400)
RBC: 3.88 MIL/uL — ABNORMAL LOW (ref 4.22–5.81)
RDW: 26.5 % — ABNORMAL HIGH (ref 11.5–15.5)
WBC: 20.3 10*3/uL — ABNORMAL HIGH (ref 4.0–10.5)
nRBC: 0.4 % — ABNORMAL HIGH (ref 0.0–0.2)

## 2019-07-07 MED ORDER — AZACITIDINE CHEMO SQ INJECTION
50.0000 mg/m2 | Freq: Once | INTRAMUSCULAR | Status: AC
  Administered 2019-07-07: 132.5 mg via SUBCUTANEOUS
  Filled 2019-07-07: qty 5.3

## 2019-07-07 MED ORDER — ONDANSETRON HCL 4 MG PO TABS
8.0000 mg | ORAL_TABLET | Freq: Once | ORAL | Status: AC
  Administered 2019-07-07: 14:00:00 8 mg via ORAL
  Filled 2019-07-07: qty 2

## 2019-07-07 NOTE — Progress Notes (Signed)
Patient here for oncology follow-up appointment, expresses no complaints or concerns at this time.    

## 2019-07-08 ENCOUNTER — Inpatient Hospital Stay: Payer: Medicare Other

## 2019-07-08 VITALS — BP 111/68 | HR 99 | Temp 96.7°F | Resp 20

## 2019-07-08 DIAGNOSIS — C9310 Chronic myelomonocytic leukemia not having achieved remission: Secondary | ICD-10-CM

## 2019-07-08 DIAGNOSIS — Z5111 Encounter for antineoplastic chemotherapy: Secondary | ICD-10-CM | POA: Diagnosis not present

## 2019-07-08 MED ORDER — AZACITIDINE CHEMO SQ INJECTION
50.0000 mg/m2 | Freq: Once | INTRAMUSCULAR | Status: AC
  Administered 2019-07-08: 15:00:00 132.5 mg via SUBCUTANEOUS
  Filled 2019-07-08: qty 5.3

## 2019-07-08 MED ORDER — ONDANSETRON HCL 4 MG PO TABS
8.0000 mg | ORAL_TABLET | Freq: Once | ORAL | Status: AC
  Administered 2019-07-08: 14:00:00 8 mg via ORAL
  Filled 2019-07-08: qty 2

## 2019-07-09 ENCOUNTER — Inpatient Hospital Stay: Payer: Medicare Other

## 2019-07-09 ENCOUNTER — Other Ambulatory Visit: Payer: Self-pay

## 2019-07-09 VITALS — BP 128/60 | HR 96 | Temp 96.1°F | Resp 20

## 2019-07-09 DIAGNOSIS — C9310 Chronic myelomonocytic leukemia not having achieved remission: Secondary | ICD-10-CM

## 2019-07-09 DIAGNOSIS — Z5111 Encounter for antineoplastic chemotherapy: Secondary | ICD-10-CM | POA: Diagnosis not present

## 2019-07-09 MED ORDER — AZACITIDINE CHEMO SQ INJECTION
50.0000 mg/m2 | Freq: Once | INTRAMUSCULAR | Status: AC
  Administered 2019-07-09: 132.5 mg via SUBCUTANEOUS
  Filled 2019-07-09: qty 5.3

## 2019-07-09 MED ORDER — ONDANSETRON HCL 4 MG PO TABS
8.0000 mg | ORAL_TABLET | Freq: Once | ORAL | Status: AC
  Administered 2019-07-09: 15:00:00 8 mg via ORAL
  Filled 2019-07-09: qty 2

## 2019-07-10 ENCOUNTER — Inpatient Hospital Stay: Payer: Medicare Other

## 2019-07-10 VITALS — BP 129/63 | HR 99 | Temp 96.5°F | Resp 18

## 2019-07-10 DIAGNOSIS — C9310 Chronic myelomonocytic leukemia not having achieved remission: Secondary | ICD-10-CM | POA: Diagnosis not present

## 2019-07-10 DIAGNOSIS — Z5111 Encounter for antineoplastic chemotherapy: Secondary | ICD-10-CM | POA: Diagnosis not present

## 2019-07-10 MED ORDER — ONDANSETRON HCL 4 MG PO TABS
8.0000 mg | ORAL_TABLET | Freq: Once | ORAL | Status: AC
  Administered 2019-07-10: 8 mg via ORAL
  Filled 2019-07-10: qty 2

## 2019-07-10 MED ORDER — AZACITIDINE CHEMO SQ INJECTION
50.0000 mg/m2 | Freq: Once | INTRAMUSCULAR | Status: AC
  Administered 2019-07-10: 132.5 mg via SUBCUTANEOUS
  Filled 2019-07-10: qty 5.3

## 2019-07-10 NOTE — Progress Notes (Signed)
Hematology/Oncology Consult note [facility]  Telephone:([phone] [phone]  Patient Care Team: Lavera Guise, MD as PCP - General (Internal Medicine) Sindy Guadeloupe, MD as Consulting Physician (Oncology)   Name of the patient: Samuel Frank  [id number]  [birth year]   Date of visit: 07/10/19  Diagnosis- CMML1  Chief complaint/ Reason for visit-on treatment assessment prior to next cycle of Vidaza  Heme/Onc history: Patient is a 79 yr old male with no significant co morbidities other than hypertension. He has recently been having bilateral knee pain and has undergone steroid injection in his knee.Patient was noted to have a high white count on his routine exam. His white count was 19.2 on 07/22/2018 with an H&H of 11.4/35.1 and a platelet count of 470. At that point he was prescribed a course of antibiotics and a repeat blood work on 08/21/2018 showed white count of 42.3, H&H of 7.1/23 with an MCV of 84 and a platelet count of 791 and hence the patient was referred to hematology for further management.  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 In addition the core biopsy showed a small focus of fibrosis containing scattering of mast cells and eosinophils most suggestive of systemic mastocytosis.bone marrow blasts 7%.Flow cytometry showed increased number of monocytic cells representing 16% of all cells with expression for HLA-DR, CD11b, CD13, CD14, CD33, CD38, CD56 and CD56 and CD64 with [facility] CD 117 are CD34.normal cytogenetics, CBL, SRSF2, SH2B3 and TET2  Repeat bone marrow biopsy after 4 cycles of Vidaza showed less pronounced monocytic/granulocytic proliferation and overall better granulopoiesis and no increase in blasts. In addition features of systemic mastocytosis not present.  After 9 cycles of Vidaza patient was noted to have significant anemia and worsening thrombocytosis for which a  repeat bone marrow biopsy was done.  Bone marrow did not show any features of progression of CMML.  Overall stable findings and no increased blast.He was found to have iron deficiency and received 2 doses of Feraheme.  Interval history-patient reports right lower extremity swelling has improved significantly.  Denies other complaints at this time  ECOG PS- 1 Pain scale- 0   Review of systems- Review of Systems  Constitutional: Negative for chills, fever, malaise/fatigue and weight loss.  HENT: Negative for congestion, ear discharge and nosebleeds.   Eyes: Negative for blurred vision.  Respiratory: Negative for cough, hemoptysis, sputum production, shortness of breath and wheezing.   Cardiovascular: Positive for leg swelling. Negative for chest pain, palpitations, orthopnea and claudication.  Gastrointestinal: Negative for abdominal pain, blood in stool, constipation, diarrhea, heartburn, melena, nausea and vomiting.  Genitourinary: Negative for dysuria, flank pain, frequency, hematuria and urgency.  Musculoskeletal: Negative for back pain, joint pain and myalgias.  Skin: Negative for rash.  Neurological: Negative for dizziness, tingling, focal weakness, seizures, weakness and headaches.  Endo/Heme/Allergies: Does not bruise/bleed easily.  Psychiatric/Behavioral: Negative for depression and suicidal ideas. The patient does not have insomnia.       No Known Allergies   Past Medical History:  Diagnosis Date  . Anemia   . CMML (chronic myelomonocytic leukemia) ([place])   . Hypertension      Past Surgical History:  Procedure Laterality Date  . COLONOSCOPY WITH PROPOFOL N/A 05/03/2015   Procedure: COLONOSCOPY WITH PROPOFOL;  Surgeon: [name], MD;  Location: [facility] ENDOSCOPY;  Service: Gastroenterology;  Laterality: N/A;  . ESOPHAGOGASTRODUODENOSCOPY (EGD) WITH PROPOFOL N/A 05/03/2015   Procedure: ESOPHAGOGASTRODUODENOSCOPY (EGD) WITH PROPOFOL;  Surgeon: [name], MD;  Location: [facility]  ENDOSCOPY;  Service: Gastroenterology;  Laterality: N/A;  . HERNIA REPAIR     1975    Social History   Socioeconomic History  . Marital status: Divorced    Spouse name: Not on file  . Number of children: 4  . Years of education: Not on file  . Highest education level: Not on file  Occupational History  . Occupation: retired    Comment: [employer] [place]  Tobacco Use  . Smoking status: Never Smoker  . Smokeless tobacco: Never Used  Substance and Sexual Activity  . Alcohol use: No  . Drug use: No  . Sexual activity: Not on file  Other Topics Concern  . Not on file  Social History Narrative  . Not on file   Social Determinants of Health   Financial Resource Strain: Low Risk   . Difficulty of Paying Living Expenses: Not hard at all  Food Insecurity: No Food Insecurity  . Worried About Charity fundraiser in the Last Year: Never true  . Ran Out of Food in the Last Year: Never true  Transportation Needs: No Transportation Needs  . Lack of Transportation (Medical): No  . Lack of Transportation (Non-Medical): No  Physical Activity: Sufficiently Active  . Days of Exercise per Week: 3 days  . Minutes of Exercise per Session: 150+ min  Stress: No Stress Concern Present  . Feeling of Stress : Not at all  Social Connections: Somewhat Isolated  . Frequency of Communication with Friends and Family: More than three times a week  . Frequency of Social Gatherings with Friends and Family: More than three times a week  . Attends Religious Services: More than 4 times per year  . Active Member of Clubs or Organizations: No  . Attends Archivist Meetings: Never  . Marital Status: Divorced  Human resources officer Violence:   . Fear of Current or Ex-Partner:   . Emotionally Abused:   [name] Physically Abused:   . Sexually Abused:     Family History  Problem Relation Age of Onset  . Alzheimer's disease Father   . Lung cancer Sister   . Cancer Sister      Current  Outpatient Medications:  .  allopurinol (ZYLOPRIM) 300 MG tablet, TAKE 1 TABLET BY MOUTH EVERY DAY, Disp: 30 tablet, Rfl: 3 .  aspirin EC 81 MG tablet, Take 81 mg by mouth daily., Disp: , Rfl:  .  colchicine 0.6 MG tablet, Take 2 tablets (1.9m) by mouth at first sign of gout flare followed by 1 tablet (0.663m after 1 hour. (Max 1.18m71mithin 1 hour), Disp: , Rfl:  .  Cyanocobalamin (B-12 PO), Take 3,000 mcg by mouth daily., Disp: , Rfl:  .  cyanocobalamin 500 MCG tablet, Take 500 mcg by mouth daily., Disp: , Rfl:  .  cyclobenzaprine (FLEXERIL) 10 MG tablet, Take 1 tablet by mouth., Disp: , Rfl:  .  ibuprofen (ADVIL,MOTRIN) 200 MG tablet, Take 200 mg by mouth every 6 (six) hours as needed. , Disp: , Rfl:  .  KLOR-CON M20 20 MEQ tablet, TAKE 1 TABLET BY MOUTH TWICE A DAY, Disp: 60 tablet, Rfl: 0 .  ondansetron (ZOFRAN) 8 MG tablet, Take 1 tablet (8 mg total) by mouth 2 (two) times daily as needed (Nausea or vomiting)., Disp: 30 tablet, Rfl: 1 .  pantoprazole (PROTONIX) 40 MG tablet, Take 1 tablet (40 mg total) by mouth daily., Disp: 90 tablet, Rfl: 1 .  prochlorperazine (COMPAZINE) 10 MG tablet, Take 1 tablet (10 mg total) by mouth every 6 (six) hours as needed (Nausea or vomiting)., Disp: 30 tablet, Rfl: 1 .  tiZANidine (ZANAFLEX) 4 MG tablet, Take 4 mg by mouth every 6 (six) hours as needed. , Disp: , Rfl:  .  valACYclovir (VALTREX) 500 MG tablet, Take 1 tablet (500 mg total) by mouth daily., Disp: 90 tablet, Rfl: 1 .  valsartan-hydrochlorothiazide (DIOVAN-HCT) 320-25 MG tablet, Take 1/2 tablet po QD, Disp: 90 tablet, Rfl: 2  Physical exam:  Vitals:   07/07/19 1309  BP: 118/60  Pulse: 97  Resp: 16  Temp: (!) 95.9 F (35.5 C)  TempSrc: Tympanic  SpO2: 100%  Weight: (!) 325 lb 1.6 oz (147.5 kg)   Physical Exam Cardiovascular:     Rate and Rhythm: Normal rate and regular rhythm.     Heart sounds: Normal heart sounds.  Pulmonary:     Effort: Pulmonary effort is normal.     Breath  sounds: Normal breath sounds.  Abdominal:     General: Bowel sounds are normal.     Palpations: Abdomen is soft.  Musculoskeletal:     Comments: Right lower extremity  swelling especially ankle edema improved as compared to last week  Skin:    General: Skin is warm and dry.  Neurological:     Mental Status: He is alert and oriented to person, place, and time.      CMP Latest Ref Rng & Units 07/07/2019  Glucose 70 - 99 mg/dL 115(H)  BUN 8 - 23 mg/dL 11  Creatinine 0.61 - 1.24 mg/dL 1.15  Sodium 135 - 145 mmol/L 137  Potassium 3.5 - 5.1 mmol/L 4.2  Chloride 98 - 111 mmol/L 103  CO2 22 - 32 mmol/L 26  Calcium 8.9 - 10.3 mg/dL 8.9  Total Protein 6.5 - 8.1 g/dL 7.8  Total Bilirubin 0.3 - 1.2 mg/dL 0.4  Alkaline Phos 38 - 126 U/L 87  AST 15 - 41 U/L 15  ALT 0 - 44 U/L 12   CBC Latest Ref Rng & Units 07/07/2019  WBC 4.0 - 10.5 K/uL 20.3(H)  Hemoglobin 13.0 - 17.0 g/dL 10.6(L)  Hematocrit 39.0 - 52.0 % 35.3(L)  Platelets 150 - 400 K/uL 419(H)    No images are attached to the encounter.  US Venous Img Lower Unilateral Right  Result Date: 07/03/2019 CLINICAL DATA:  Right lower extremity edema. EXAM: RIGHT LOWER EXTREMITY VENOUS DOPPLER ULTRASOUND TECHNIQUE: Gray-scale sonography with graded compression, as well as color Doppler and duplex ultrasound were performed to evaluate the lower extremity deep venous systems from the level of the common femoral vein and including the common femoral, femoral, profunda femoral, popliteal and calf veins including the posterior tibial, peroneal and gastrocnemius veins when visible. The superficial great saphenous vein was also interrogated. Spectral Doppler was utilized to evaluate flow at rest and with distal augmentation maneuvers in the common femoral, femoral and popliteal veins. COMPARISON:  None. FINDINGS: Contralateral Common Femoral Vein: Respiratory phasicity is normal and symmetric with the symptomatic side. No evidence of thrombus. Normal  compressibility. Common Femoral Vein: No evidence of thrombus. Normal compressibility, respiratory phasicity and response to augmentation. Saphenofemoral Junction: No evidence of thrombus. Normal compressibility and flow on color Doppler imaging. Profunda Femoral Vein: No evidence of thrombus. Normal compressibility and flow on color Doppler imaging. Femoral Vein: No evidence of thrombus. Normal compressibility, respiratory phasicity and response to augmentation. Popliteal Vein: No evidence of thrombus. Normal compressibility, respiratory phasicity and  response to augmentation. Calf Veins: No evidence of thrombus. Normal compressibility and flow on color Doppler imaging. Superficial Great Saphenous Vein: No evidence of thrombus. Normal compressibility. Venous Reflux:  None. Other Findings: No evidence of superficial thrombophlebitis or abnormal fluid collection. IMPRESSION: No evidence of right lower extremity deep venous thrombosis. Electronically Signed   By: Aletta Edouard M.D.   On: 07/03/2019 16:37   CT BONE MARROW BIOPSY & ASPIRATION  Result Date: 06/18/2019 INDICATION: 80 year old with chronic myelomonocytic leukemia. Evaluate response to therapy. EXAM: CT GUIDED BONE MARROW ASPIRATES AND BIOPSY Physician: Stephan Minister. Anselm Pancoast, MD MEDICATIONS: None. ANESTHESIA/SEDATION: Fentanyl 100 mcg IV; Versed 2.0 mg IV Moderate Sedation Time:  25 minutes The patient was continuously monitored during the procedure by the interventional radiology nurse under my direct supervision. COMPLICATIONS: None immediate. PROCEDURE: The procedure was explained to the patient. The risks and benefits of the procedure were discussed and the patient's questions were addressed. Informed consent was obtained from the patient. The patient was placed prone on CT table. Images of the pelvis were obtained. The right side of back was prepped and draped in sterile fashion. The skin and right posterior ilium were anesthetized with 1% lidocaine. 11  gauge bone needle was directed into the right ilium with CT guidance. Two aspirates and two core biopsies were obtained. Bandage placed over the puncture site. FINDINGS: Biopsy needle was successfully advanced into the posterior right ilium. IMPRESSION: CT guided bone marrow aspiration and core biopsy. Electronically Signed   By: Markus Daft M.D.   On: 06/18/2019 12:21     Assessment and plan- Patient is a 79 y.o. male with CMML 1 here for cycle 10 of Vidaza  Recent bone marrow biopsy after 9 cycles showed stable disease without any evidence of progression or increased blast count.  Plan is to continue Vidaza until progression or toxicity.  He will proceed with cycle 10-day 1 of Vidaza today and receive it from day 1 to day 5.  I will see him back in 4 weeks with CBC with differential, CMP for cycle 11.  Iron deficiency anemia: Hemoglobin improved to 10 from 2:07 doses of Feraheme.  Leukocytosis/thrombocytosis likely reactive.  Continue to monitor  Right lower extremity edema: No evidence of DVT.  It appears significantly improved today.  Continue to monitor   Visit Diagnosis 1. Encounter for antineoplastic chemotherapy   2. 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 Normocytic anemia   4. Thrombocytosis ([place])      Dr. Randa Evens, MD, MPH [facility] [phone] 07/10/2019 11:27 AM

## 2019-07-11 ENCOUNTER — Inpatient Hospital Stay: Payer: Medicare Other

## 2019-07-11 ENCOUNTER — Other Ambulatory Visit: Payer: Self-pay

## 2019-07-11 VITALS — BP 125/79 | HR 98 | Temp 97.9°F | Resp 18

## 2019-07-11 DIAGNOSIS — C9310 Chronic myelomonocytic leukemia not having achieved remission: Secondary | ICD-10-CM | POA: Diagnosis not present

## 2019-07-11 DIAGNOSIS — Z5111 Encounter for antineoplastic chemotherapy: Secondary | ICD-10-CM | POA: Diagnosis not present

## 2019-07-11 MED ORDER — AZACITIDINE CHEMO SQ INJECTION
50.0000 mg/m2 | Freq: Once | INTRAMUSCULAR | Status: AC
  Administered 2019-07-11: 132.5 mg via SUBCUTANEOUS
  Filled 2019-07-11: qty 5.3

## 2019-07-11 MED ORDER — ONDANSETRON HCL 4 MG PO TABS
8.0000 mg | ORAL_TABLET | Freq: Once | ORAL | Status: AC
  Administered 2019-07-11: 14:00:00 8 mg via ORAL
  Filled 2019-07-11: qty 2

## 2019-07-29 ENCOUNTER — Other Ambulatory Visit: Payer: Self-pay | Admitting: Oncology

## 2019-07-29 ENCOUNTER — Telehealth: Payer: Self-pay

## 2019-07-29 NOTE — Telephone Encounter (Signed)
Lmom to confirm and screen for 07-31-19 ov.

## 2019-07-31 ENCOUNTER — Other Ambulatory Visit: Payer: Self-pay

## 2019-07-31 ENCOUNTER — Ambulatory Visit (INDEPENDENT_AMBULATORY_CARE_PROVIDER_SITE_OTHER): Payer: Medicare Other | Admitting: Nurse Practitioner

## 2019-07-31 ENCOUNTER — Encounter: Payer: Self-pay | Admitting: Nurse Practitioner

## 2019-07-31 VITALS — BP 128/61 | HR 93 | Temp 96.5°F | Resp 16 | Ht 74.5 in | Wt 327.2 lb

## 2019-07-31 DIAGNOSIS — I10 Essential (primary) hypertension: Secondary | ICD-10-CM

## 2019-07-31 DIAGNOSIS — C9310 Chronic myelomonocytic leukemia not having achieved remission: Secondary | ICD-10-CM

## 2019-07-31 DIAGNOSIS — D509 Iron deficiency anemia, unspecified: Secondary | ICD-10-CM | POA: Diagnosis not present

## 2019-07-31 DIAGNOSIS — Z0001 Encounter for general adult medical examination with abnormal findings: Secondary | ICD-10-CM | POA: Diagnosis not present

## 2019-07-31 NOTE — Progress Notes (Signed)
[facility] [place], [place] [address]  Internal MEDICINE  Office Visit Note  Patient Name: Samuel Frank  [id number]  [id number]  Date of Service: 08/04/2019   Pt is here for routine health maintenance examination  Chief Complaint  Patient presents with  . Hypertension  . Medicare Wellness  . Anemia  . Gastroesophageal Reflux     The patient is here for health maintenance exam. He states that he is doing well. Has no concerns or complaints today. He continues to see oncology for chronic myelomonocytic leukemia. He is making progress with treatment. Is now having chemotherapy treatments four days a mont, down from five days per month. He is tolerating this very well without negative side effects. Blood pressure remains well managed. He has had both of his Pfizer COVID 19 vaccines and tolerated them well. He is due to have routine, fasting labs done. Will give written order for these to be drawn with next routine blood draw for oncology.     Current Medication: Outpatient Encounter Medications as of 07/31/2019  Medication Sig  . allopurinol (ZYLOPRIM) 300 MG tablet TAKE 1 TABLET BY MOUTH EVERY DAY  . aspirin EC 81 MG tablet Take 81 mg by mouth daily.  . colchicine 0.6 MG tablet Take 2 tablets (1.2mg ) by mouth at first sign of gout flare followed by 1 tablet (0.6mg ) after 1 hour. (Max 1.8mg  within 1 hour)  . Cyanocobalamin (B-12 PO) Take 3,000 mcg by mouth daily.  . cyanocobalamin 500 MCG tablet Take 500 mcg by mouth daily.  . cyclobenzaprine (FLEXERIL) 10 MG tablet Take 1 tablet by mouth.  [name] ibuprofen (ADVIL,MOTRIN) 200 MG tablet Take 200 mg by mouth every 6 (six) hours as needed.   [name] KLOR-CON M20 20 MEQ tablet TAKE 1 TABLET BY MOUTH TWICE A DAY  . ondansetron (ZOFRAN) 8 MG tablet Take 1 tablet (8 mg total) by mouth 2 (two) times daily as needed (Nausea or vomiting).  . pantoprazole (PROTONIX) 40 MG tablet Take 1 tablet (40 mg total) by mouth daily.   . prochlorperazine (COMPAZINE) 10 MG tablet Take 1 tablet (10 mg total) by mouth every 6 (six) hours as needed (Nausea or vomiting).  [name] tiZANidine (ZANAFLEX) 4 MG tablet Take 4 mg by mouth every 6 (six) hours as needed.   . valACYclovir (VALTREX) 500 MG tablet Take 1 tablet (500 mg total) by mouth daily.  . valsartan-hydrochlorothiazide (DIOVAN-HCT) 320-25 MG tablet Take 1/2 tablet po QD   No facility-administered encounter medications on file as of 07/31/2019.    Surgical History: Past Surgical History:  Procedure Laterality Date  . COLONOSCOPY WITH PROPOFOL N/A 05/03/2015   Procedure: COLONOSCOPY WITH PROPOFOL;  Surgeon: [name], MD;  Location: [facility] ENDOSCOPY;  Service: Gastroenterology;  Laterality: N/A;  . ESOPHAGOGASTRODUODENOSCOPY (EGD) WITH PROPOFOL N/A 05/03/2015   Procedure: ESOPHAGOGASTRODUODENOSCOPY (EGD) WITH PROPOFOL;  Surgeon: [name], MD;  Location: [facility] ENDOSCOPY;  Service: Gastroenterology;  Laterality: N/A;  . HERNIA REPAIR     1975    Medical History: Past Medical History:  Diagnosis Date  . Anemia   . CMML (chronic myelomonocytic leukemia) ([place])   . Hypertension     Family History: Family History  Problem Relation Age of Onset  . Alzheimer's disease Father   . Lung cancer Sister   . Cancer Sister       Review of Systems  Constitutional: Positive for fatigue. Negative for chills and unexpected weight change.  HENT: Negative for  congestion, postnasal drip, rhinorrhea, sneezing and sore throat.   Respiratory: Negative for cough, chest tightness, shortness of breath and wheezing.   Cardiovascular: Negative for chest pain and palpitations.       Blood pressure continues to be well managed.   Gastrointestinal: Negative for abdominal pain, constipation, diarrhea, nausea and vomiting.  Endocrine: Negative for cold intolerance, heat intolerance, polydipsia and polyuria.  Genitourinary: Negative for dysuria, frequency and urgency.  Musculoskeletal: Positive  for arthralgias and myalgias. Negative for back pain, joint swelling and neck pain.  Skin: Negative for rash.  Allergic/Immunologic: Negative for environmental allergies.  Neurological: Negative for dizziness, tremors, numbness and headaches.  Hematological: Negative for adenopathy. Does not bruise/bleed easily.  Psychiatric/Behavioral: Negative for behavioral problems (Depression), sleep disturbance and suicidal ideas. The patient is not nervous/anxious.      Today's Vitals   07/31/19 1050  BP: 128/61  Pulse: 93  Resp: 16  Temp: (!) 96.5 F (35.8 C)  SpO2: 95%  Weight: (!) 327 lb 3.2 oz (148.4 kg)  Height: 6' 2.5" (1.892 m)   Body mass index is 41.45 kg/m.  Physical Exam Vitals and nursing note reviewed.  Constitutional:      General: He is not in acute distress.    Appearance: Normal appearance. He is well-developed. He is not ill-appearing or diaphoretic.  HENT:     Head: Normocephalic and atraumatic.     Right Ear: Tympanic membrane normal.     Left Ear: Tympanic membrane normal.     Nose: Nose normal.     Mouth/Throat:     Pharynx: No oropharyngeal exudate.  Eyes:     Pupils: Pupils are equal, round, and reactive to light.  Neck:     Thyroid: No thyromegaly.     Vascular: No carotid bruit or JVD.     Trachea: No tracheal deviation.  Cardiovascular:     Rate and Rhythm: Normal rate and regular rhythm.     Pulses: Normal pulses.     Heart sounds: Normal heart sounds. No murmur. No friction rub. No gallop.   Pulmonary:     Effort: Pulmonary effort is normal. No respiratory distress.     Breath sounds: Normal breath sounds. No wheezing, rhonchi or rales.  Chest:     Chest wall: No tenderness.  Abdominal:     General: Bowel sounds are normal.     Palpations: Abdomen is soft.     Tenderness: There is no abdominal tenderness.  Musculoskeletal:        General: Normal range of motion.     Cervical back: Normal range of motion and neck supple.  Lymphadenopathy:      Cervical: No cervical adenopathy.  Skin:    General: Skin is warm and dry.  Neurological:     General: No focal deficit present.     Mental Status: He is alert and oriented to person, place, and time.     Cranial Nerves: No cranial nerve deficit.  Psychiatric:        Mood and Affect: Mood normal.        Behavior: Behavior normal.        Thought Content: Thought content normal.        Judgment: Judgment normal.    Depression screen [facility] 2/9 07/31/2019 05/22/2019 02/21/2019 07/16/2018 02/28/2018  Decreased Interest 0 0 0 0 0  Down, Depressed, Hopeless 0 0 0 0 0  PHQ - 2 Score 0 0 0 0 0    Functional Status Survey: Is  the patient deaf or have difficulty hearing?: No Does the patient have difficulty seeing, even when wearing glasses/contacts?: No Does the patient have difficulty concentrating, remembering, or making decisions?: No Does the patient have difficulty walking or climbing stairs?: No Does the patient have difficulty dressing or bathing?: No Does the patient have difficulty doing errands alone such as visiting a doctor's office or shopping?: No  MMSE - [place] Exam 07/31/2019 07/16/2018 07/12/2017  Orientation to time 5 5 5   Orientation to Place 5 5 5   Registration 3 3 3   Attention/ Calculation 5 5 5   Recall 3 3 3   Language- name 2 objects 2 2 2   Language- repeat 1 1 1   Language- follow 3 step command 3 3 3   Language- read & follow direction 1 1 1   Write a sentence 1 1 1   Copy design 1 1 0  Total score 30 30 29     Fall Risk  07/31/2019 05/22/2019 02/21/2019 10/08/2018 10/07/2018  Falls in the past year? 0 0 0 0 0  Number falls in past yr: - - - - -  Injury with Fall? - - - - -      Assessment/Plan:  1. Encounter for general adult medical examination with abnormal findings Annual health maintenance exam today. Lab order given to have routine, fasting labs drawn with next routine blood draw for oncology.   2. Chronic myelomonocytic leukemia not having achieved  remission ([place]) Continue regular visits with oncology as scheduled.   3. Iron deficiency anemia, unspecified iron deficiency anemia type Routine labs draws per oncology. Follow up as scheduled.   4. Essential hypertension, benign Stable. Continue bp medication as prescribed.   General Counseling: shaunn mihok understanding of the findings of todays visit and agrees with plan of treatment. I have discussed any further diagnostic evaluation that may be needed or ordered today. We also reviewed his medications today. he has been encouraged to call the office with any questions or concerns that should arise related to todays visit.    Counseling:  Hypertension Counseling:   The following hypertensive lifestyle modification were recommended and discussed:  1. Limiting alcohol intake to less than 1 oz/day of ethanol:(24 oz of beer or 8 oz of wine or 2 oz of 100-proof whiskey). 2. Take baby ASA 81 mg daily. 3. Importance of regular aerobic exercise and losing weight. 4. Reduce dietary saturated fat and cholesterol intake for overall cardiovascular health. 5. Maintaining adequate dietary potassium, calcium, and magnesium intake. 6. Regular monitoring of the blood pressure. 7. Reduce sodium intake to less than 100 mmol/day (less than 2.3 gm of sodium or less than 6 gm of sodium choride)   This patient was seen by [place] with Dr Lavera Guise as a part of collaborative care agreement  Total time spent: 67 Minutes  Time spent includes review of chart, medications, test results, and follow up plan with the patient.     Lavera Guise, MD  Internal Medicine

## 2019-08-05 ENCOUNTER — Encounter: Payer: Self-pay | Admitting: Oncology

## 2019-08-05 ENCOUNTER — Inpatient Hospital Stay: Payer: Medicare Other | Attending: Oncology

## 2019-08-05 ENCOUNTER — Inpatient Hospital Stay: Payer: Medicare Other

## 2019-08-05 ENCOUNTER — Inpatient Hospital Stay (HOSPITAL_BASED_OUTPATIENT_CLINIC_OR_DEPARTMENT_OTHER): Payer: Medicare Other | Admitting: Oncology

## 2019-08-05 ENCOUNTER — Other Ambulatory Visit: Payer: Self-pay

## 2019-08-05 VITALS — BP 120/72 | HR 95 | Temp 98.5°F | Resp 18 | Wt 325.7 lb

## 2019-08-05 DIAGNOSIS — Z125 Encounter for screening for malignant neoplasm of prostate: Secondary | ICD-10-CM | POA: Diagnosis not present

## 2019-08-05 DIAGNOSIS — Z801 Family history of malignant neoplasm of trachea, bronchus and lung: Secondary | ICD-10-CM | POA: Diagnosis not present

## 2019-08-05 DIAGNOSIS — C9310 Chronic myelomonocytic leukemia not having achieved remission: Secondary | ICD-10-CM

## 2019-08-05 DIAGNOSIS — Z5111 Encounter for antineoplastic chemotherapy: Secondary | ICD-10-CM | POA: Insufficient documentation

## 2019-08-05 DIAGNOSIS — R0602 Shortness of breath: Secondary | ICD-10-CM | POA: Insufficient documentation

## 2019-08-05 DIAGNOSIS — D649 Anemia, unspecified: Secondary | ICD-10-CM | POA: Insufficient documentation

## 2019-08-05 DIAGNOSIS — Z0000 Encounter for general adult medical examination without abnormal findings: Secondary | ICD-10-CM | POA: Diagnosis not present

## 2019-08-05 DIAGNOSIS — Z79899 Other long term (current) drug therapy: Secondary | ICD-10-CM | POA: Insufficient documentation

## 2019-08-05 DIAGNOSIS — Z809 Family history of malignant neoplasm, unspecified: Secondary | ICD-10-CM | POA: Diagnosis not present

## 2019-08-05 DIAGNOSIS — I471 Supraventricular tachycardia: Secondary | ICD-10-CM | POA: Diagnosis not present

## 2019-08-05 LAB — CBC WITH DIFFERENTIAL/PLATELET
Abs Immature Granulocytes: 0 10*3/uL (ref 0.00–0.07)
Basophils Absolute: 0.1 10*3/uL (ref 0.0–0.1)
Basophils Relative: 1 %
Eosinophils Absolute: 0.2 10*3/uL (ref 0.0–0.5)
Eosinophils Relative: 2 %
HCT: 36.3 % — ABNORMAL LOW (ref 39.0–52.0)
Hemoglobin: 11.4 g/dL — ABNORMAL LOW (ref 13.0–17.0)
Lymphocytes Relative: 32 %
Lymphs Abs: 2.8 10*3/uL (ref 0.7–4.0)
MCH: 27.5 pg (ref 26.0–34.0)
MCHC: 31.4 g/dL (ref 30.0–36.0)
MCV: 87.7 fL (ref 80.0–100.0)
Monocytes Absolute: 0.9 10*3/uL (ref 0.1–1.0)
Monocytes Relative: 10 %
Neutro Abs: 4.8 10*3/uL (ref 1.7–7.7)
Neutrophils Relative %: 55 %
Platelets: 602 10*3/uL — ABNORMAL HIGH (ref 150–400)
RBC: 4.14 MIL/uL — ABNORMAL LOW (ref 4.22–5.81)
RDW: 25 % — ABNORMAL HIGH (ref 11.5–15.5)
Smear Review: INCREASED
WBC: 8.8 10*3/uL (ref 4.0–10.5)
nRBC: 0.5 % — ABNORMAL HIGH (ref 0.0–0.2)

## 2019-08-05 LAB — LIPID PANEL
Cholesterol: 159 mg/dL (ref 0–200)
HDL: 31 mg/dL — ABNORMAL LOW (ref >40)
LDL Cholesterol: 104 mg/dL — ABNORMAL HIGH (ref 0–99)
Total CHOL/HDL Ratio: 5.1 RATIO
Triglycerides: 121 mg/dL (ref <150)
VLDL: 24 mg/dL (ref 0–40)

## 2019-08-05 LAB — COMPREHENSIVE METABOLIC PANEL
ALT: 10 U/L (ref 0–44)
AST: 12 U/L — ABNORMAL LOW (ref 15–41)
Albumin: 4 g/dL (ref 3.5–5.0)
Alkaline Phosphatase: 91 U/L (ref 38–126)
Anion gap: 8 (ref 5–15)
BUN: 10 mg/dL (ref 8–23)
CO2: 27 mmol/L (ref 22–32)
Calcium: 8.9 mg/dL (ref 8.9–10.3)
Chloride: 103 mmol/L (ref 98–111)
Creatinine, Ser: 0.81 mg/dL (ref 0.61–1.24)
GFR calc Af Amer: >60 mL/min (ref >60)
GFR calc non Af Amer: >60 mL/min (ref >60)
Glucose, Bld: 106 mg/dL — ABNORMAL HIGH (ref 70–99)
Potassium: 3.9 mmol/L (ref 3.5–5.1)
Sodium: 138 mmol/L (ref 135–145)
Total Bilirubin: 0.5 mg/dL (ref 0.3–1.2)
Total Protein: 7.7 g/dL (ref 6.5–8.1)

## 2019-08-05 LAB — T4, FREE: Free T4: 0.88 ng/dL (ref 0.61–1.12)

## 2019-08-05 LAB — TSH: TSH: 0.98 u[IU]/mL (ref 0.350–4.500)

## 2019-08-05 LAB — PSA: Prostatic Specific Antigen: 2.73 ng/mL (ref 0.00–4.00)

## 2019-08-05 MED ORDER — AZACITIDINE CHEMO SQ INJECTION
50.0000 mg/m2 | Freq: Once | INTRAMUSCULAR | Status: AC
  Administered 2019-08-05: 132.5 mg via SUBCUTANEOUS
  Filled 2019-08-05: qty 5.3

## 2019-08-05 MED ORDER — ONDANSETRON HCL 4 MG PO TABS
8.0000 mg | ORAL_TABLET | Freq: Once | ORAL | Status: AC
  Administered 2019-08-05: 8 mg via ORAL
  Filled 2019-08-05: qty 2

## 2019-08-05 NOTE — Progress Notes (Signed)
No concerns at this time. `

## 2019-08-06 ENCOUNTER — Inpatient Hospital Stay: Payer: Medicare Other

## 2019-08-06 VITALS — BP 100/65 | HR 100 | Temp 97.0°F | Resp 19

## 2019-08-06 DIAGNOSIS — D649 Anemia, unspecified: Secondary | ICD-10-CM | POA: Diagnosis not present

## 2019-08-06 DIAGNOSIS — C9310 Chronic myelomonocytic leukemia not having achieved remission: Secondary | ICD-10-CM

## 2019-08-06 DIAGNOSIS — R0602 Shortness of breath: Secondary | ICD-10-CM | POA: Diagnosis not present

## 2019-08-06 DIAGNOSIS — Z801 Family history of malignant neoplasm of trachea, bronchus and lung: Secondary | ICD-10-CM | POA: Diagnosis not present

## 2019-08-06 DIAGNOSIS — Z809 Family history of malignant neoplasm, unspecified: Secondary | ICD-10-CM | POA: Diagnosis not present

## 2019-08-06 DIAGNOSIS — Z5111 Encounter for antineoplastic chemotherapy: Secondary | ICD-10-CM | POA: Diagnosis not present

## 2019-08-06 DIAGNOSIS — I471 Supraventricular tachycardia: Secondary | ICD-10-CM | POA: Diagnosis not present

## 2019-08-06 DIAGNOSIS — Z79899 Other long term (current) drug therapy: Secondary | ICD-10-CM | POA: Diagnosis not present

## 2019-08-06 MED ORDER — AZACITIDINE CHEMO SQ INJECTION
50.0000 mg/m2 | Freq: Once | INTRAMUSCULAR | Status: AC
  Administered 2019-08-06: 132.5 mg via SUBCUTANEOUS
  Filled 2019-08-06: qty 5.3

## 2019-08-06 MED ORDER — ONDANSETRON HCL 4 MG PO TABS
8.0000 mg | ORAL_TABLET | Freq: Once | ORAL | Status: AC
  Administered 2019-08-06: 8 mg via ORAL
  Filled 2019-08-06: qty 2

## 2019-08-07 ENCOUNTER — Other Ambulatory Visit: Payer: Self-pay

## 2019-08-07 ENCOUNTER — Inpatient Hospital Stay: Payer: Medicare Other

## 2019-08-07 VITALS — BP 117/66 | HR 91 | Temp 98.8°F | Resp 20

## 2019-08-07 DIAGNOSIS — Z79899 Other long term (current) drug therapy: Secondary | ICD-10-CM | POA: Diagnosis not present

## 2019-08-07 DIAGNOSIS — C9310 Chronic myelomonocytic leukemia not having achieved remission: Secondary | ICD-10-CM

## 2019-08-07 DIAGNOSIS — Z809 Family history of malignant neoplasm, unspecified: Secondary | ICD-10-CM | POA: Diagnosis not present

## 2019-08-07 DIAGNOSIS — R0602 Shortness of breath: Secondary | ICD-10-CM | POA: Diagnosis not present

## 2019-08-07 DIAGNOSIS — D649 Anemia, unspecified: Secondary | ICD-10-CM | POA: Diagnosis not present

## 2019-08-07 DIAGNOSIS — Z801 Family history of malignant neoplasm of trachea, bronchus and lung: Secondary | ICD-10-CM | POA: Diagnosis not present

## 2019-08-07 DIAGNOSIS — Z5111 Encounter for antineoplastic chemotherapy: Secondary | ICD-10-CM | POA: Diagnosis not present

## 2019-08-07 DIAGNOSIS — I471 Supraventricular tachycardia: Secondary | ICD-10-CM | POA: Diagnosis not present

## 2019-08-07 MED ORDER — AZACITIDINE CHEMO SQ INJECTION
50.0000 mg/m2 | Freq: Once | INTRAMUSCULAR | Status: AC
  Administered 2019-08-07: 132.5 mg via SUBCUTANEOUS
  Filled 2019-08-07: qty 5.3

## 2019-08-07 MED ORDER — ONDANSETRON HCL 4 MG PO TABS
8.0000 mg | ORAL_TABLET | Freq: Once | ORAL | Status: AC
  Administered 2019-08-07: 8 mg via ORAL
  Filled 2019-08-07: qty 2

## 2019-08-07 NOTE — Progress Notes (Signed)
Hematology/Oncology Consult note [facility]  [phone] [phone]  Patient Care Team: Lavera Guise, MD as PCP - General (Internal Medicine) Sindy Guadeloupe, MD as Consulting Physician (Oncology)   Name of the patient: Samuel Frank  [id number]  [birth year]   Date of visit: 08/07/19  Diagnosis-  CMML1  Chief complaint/ Reason for visit-on treatment assessment prior to next cycle of Vidaza  Heme/Onc history: Patient is a 79 yr old male with no significant co morbidities other than hypertension. He has recently been having bilateral knee pain and has undergone steroid injection in his knee.Patient was noted to have a high white count on his routine exam. His white count was 19.2 on 07/22/2018 with an H&H of 11.4/35.1 and a platelet count of 470. At that point he was prescribed a course of antibiotics and a repeat blood work on 08/21/2018 showed white count of 42.3, H&H of 7.1/23 with an MCV of 84 and a platelet count of 791 and hence the patient was referred to hematology for further management.  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 In addition the core biopsy showed a small focus of fibrosis containing scattering of mast cells and eosinophils most suggestive of systemic mastocytosis.bone marrow blasts 7%.Flow cytometry showed increased number of monocytic cells representing 16% of all cells with expression for HLA-DR, CD11b, CD13, CD14, CD33, CD38, CD56 and CD56 and CD64 with [facility] CD 117 are CD34.normal cytogenetics, CBL, SRSF2, SH2B3 and TET2  Repeat bone marrow biopsy after 4 cycles of Vidaza showed less pronounced monocytic/granulocytic proliferation and overall better granulopoiesis and no increase in blasts. In addition features of systemic mastocytosis not present.  After 9 cycles of Vidaza patient was noted to have significant anemia and worsening thrombocytosis for which  a repeat bone marrow biopsy was done.  Bone marrow did not show any features of progression of CMML.  Overall stable findings and no increased blast.He was found to have iron deficiency and received 2 doses of Feraheme.   Interval history-patient currently reports doing well and denies any complaints at this time.  His right leg swelling is now resolved.  ECOG PS- 1 Pain scale- 0   Review of systems- Review of Systems  Constitutional: Negative for chills, fever, malaise/fatigue and weight loss.  HENT: Negative for congestion, ear discharge and nosebleeds.   Eyes: Negative for blurred vision.  Respiratory: Negative for cough, hemoptysis, sputum production, shortness of breath and wheezing.   Cardiovascular: Negative for chest pain, palpitations, orthopnea and claudication.  Gastrointestinal: Negative for abdominal pain, blood in stool, constipation, diarrhea, heartburn, melena, nausea and vomiting.  Genitourinary: Negative for dysuria, flank pain, frequency, hematuria and urgency.  Musculoskeletal: Negative for back pain, joint pain and myalgias.  Skin: Negative for rash.  Neurological: Negative for dizziness, tingling, focal weakness, seizures, weakness and headaches.  Endo/Heme/Allergies: Does not bruise/bleed easily.  Psychiatric/Behavioral: Negative for depression and suicidal ideas. The patient does not have insomnia.       No Known Allergies   Past Medical History:  Diagnosis Date  . Anemia   . CMML (chronic myelomonocytic leukemia) ([place])   . Hypertension      Past Surgical History:  Procedure Laterality Date  . COLONOSCOPY WITH PROPOFOL N/A 05/03/2015   Procedure: COLONOSCOPY WITH PROPOFOL;  Surgeon: [name], MD;  Location: [facility] ENDOSCOPY;  Service: Gastroenterology;  Laterality: N/A;  . ESOPHAGOGASTRODUODENOSCOPY (EGD) WITH PROPOFOL N/A 05/03/2015   Procedure: ESOPHAGOGASTRODUODENOSCOPY (EGD)  WITH PROPOFOL;  Surgeon: [name], MD;  Location: [facility] END[facility]COPY;  Service:  Gastroenterology;  Laterality: N/A;  . HERNIA REPAIR     1975    Social History   Socioeconomic History  . Marital status: Divorced    Spouse name: Not on file  . Number of children: 4  . Years of education: Not on file  . Highest education level: Not on file  Occupational History  . Occupation: retired    Comment: [employer]  Tobacco Use  . Smoking status: Never Smoker  . Smokeless tobacco: Never Used  Substance and Sexual Activity  . Alcohol use: No  . Drug use: No  . Sexual activity: Not on file  Other Topics Concern  . Not on file  Social History Narrative  . Not on file   Social Determinants of Health   Financial Resource Strain: Low Risk   . Difficulty of Paying Living Expenses: Not hard at all  Food Insecurity: No Food Insecurity  . Worried About Charity fundraiser in the Last Year: Never true  . Ran Out of Food in the Last Year: Never true  Transportation Needs: No Transportation Needs  . Lack of Transportation (Medical): No  . Lack of Transportation (Non-Medical): No  Physical Activity: Sufficiently Active  . Days of Exercise per Week: 3 days  . Minutes of Exercise per Session: 150+ min  Stress: No Stress Concern Present  . Feeling of Stress : Not at all  Social Connections: Somewhat Isolated  . Frequency of Communication with Friends and Family: More than three times a week  . Frequency of Social Gatherings with Friends and Family: More than three times a week  . Attends Religious Services: More than 4 times per year  . Active Member of Clubs or Organizations: No  . Attends Archivist Meetings: Never  . Marital Status: Divorced  Human resources officer Violence:   . Fear of Current or Ex-Partner:   . Emotionally Abused:   [name] Physically Abused:   . Sexually Abused:     Family History  Problem Relation Age of Onset  . Alzheimer's disease Father   . Lung cancer Sister   . Cancer Sister      Current Outpatient Medications:  .   allopurinol (ZYLOPRIM) 300 MG tablet, TAKE 1 TABLET BY MOUTH EVERY DAY, Disp: 30 tablet, Rfl: 3 .  aspirin EC 81 MG tablet, Take 81 mg by mouth daily., Disp: , Rfl:  .  colchicine 0.6 MG tablet, Take 2 tablets (1.19m) by mouth at first sign of gout flare followed by 1 tablet (0.654m after 1 hour. (Max 1.38m13mithin 1 hour), Disp: , Rfl:  .  Cyanocobalamin (B-12 PO), Take 3,000 mcg by mouth daily., Disp: , Rfl:  .  cyanocobalamin 500 MCG tablet, Take 500 mcg by mouth daily., Disp: , Rfl:  .  cyclobenzaprine (FLEXERIL) 10 MG tablet, Take 1 tablet by mouth., Disp: , Rfl:  .  ibuprofen (ADVIL,MOTRIN) 200 MG tablet, Take 200 mg by mouth every 6 (six) hours as needed. , Disp: , Rfl:  .  KLOR-CON M20 20 MEQ tablet, TAKE 1 TABLET BY MOUTH TWICE A DAY, Disp: 60 tablet, Rfl: 0 .  ondansetron (ZOFRAN) 8 MG tablet, Take 1 tablet (8 mg total) by mouth 2 (two) times daily as needed (Nausea or vomiting)., Disp: 30 tablet, Rfl: 1 .  pantoprazole (PROTONIX) 40 MG tablet, Take 1 tablet (40 mg total) by mouth daily., Disp: 90 tablet,  Rfl: 1 .  prochlorperazine (COMPAZINE) 10 MG tablet, Take 1 tablet (10 mg total) by mouth every 6 (six) hours as needed (Nausea or vomiting)., Disp: 30 tablet, Rfl: 1 .  tiZANidine (ZANAFLEX) 4 MG tablet, Take 4 mg by mouth every 6 (six) hours as needed. , Disp: , Rfl:  .  valACYclovir (VALTREX) 500 MG tablet, Take 1 tablet (500 mg total) by mouth daily., Disp: 90 tablet, Rfl: 1 .  valsartan-hydrochlorothiazide (DIOVAN-HCT) 320-25 MG tablet, Take 1/2 tablet po QD, Disp: 90 tablet, Rfl: 2 No current facility-administered medications for this visit.  Facility-Administered Medications Ordered in Other Visits:  .  azaCITIDine (VIDAZA) chemo injection 132.5 mg, 50 mg/m2 (Treatment Plan Recorded), Subcutaneous, Once, Sindy Guadeloupe, MD  Physical exam:  Vitals:   08/05/19 1314  BP: 120/72  Pulse: 95  Resp: 18  Temp: 98.5 F (36.9 C)  TempSrc: Oral  SpO2: 99%  Weight: (!) 325 lb 11.2  oz (147.7 kg)   Physical Exam Constitutional:      General: He is not in acute distress. Cardiovascular:     Rate and Rhythm: Normal rate and regular rhythm.     Heart sounds: Normal heart sounds.  Pulmonary:     Effort: Pulmonary effort is normal.     Breath sounds: Normal breath sounds.  Abdominal:     General: Bowel sounds are normal.     Palpations: Abdomen is soft.  Skin:    General: Skin is warm and dry.  Neurological:     Mental Status: He is alert and oriented to person, place, and time.      CMP Latest Ref Rng & Units 08/05/2019  Glucose 70 - 99 mg/dL 106(H)  BUN 8 - 23 mg/dL 10  Creatinine 0.61 - 1.24 mg/dL 0.81  Sodium 135 - 145 mmol/L 138  Potassium 3.5 - 5.1 mmol/L 3.9  Chloride 98 - 111 mmol/L 103  CO2 22 - 32 mmol/L 27  Calcium 8.9 - 10.3 mg/dL 8.9  Total Protein 6.5 - 8.1 g/dL 7.7  Total Bilirubin 0.3 - 1.2 mg/dL 0.5  Alkaline Phos 38 - 126 U/L 91  AST 15 - 41 U/L 12(L)  ALT 0 - 44 U/L 10   CBC Latest Ref Rng & Units 08/05/2019  WBC 4.0 - 10.5 K/uL 8.8  Hemoglobin 13.0 - 17.0 g/dL 11.4(L)  Hematocrit 39.0 - 52.0 % 36.3(L)  Platelets 150 - 400 K/uL 602(H)      Assessment and plan- Patient is a 79 y.o. male with CMML 1 here for cycle 10 of Vidaza  Counts okay to proceed with cycle 10 of Vidaza today.  Anemia has again improved after giving him Feraheme and is up to 11.4.  He does have thrombocytosis at baseline likely reactive/secondary to CMML.  He will receive Vidaza for 5 days this month and I will see him back in 4 weeks for cycle 12.  Patient will continue allopurinol as well as Valtrex prophylaxis Visit Diagnosis 1. Screening for prostate cancer   2. Encounter for annual wellness exam in Medicare patient   3. Encounter for antineoplastic chemotherapy   4. Chronic myelomonocytic leukemia not having achieved remission ([place])      Dr. Randa Evens, MD, MPH [facility] [phone] 08/07/2019 1:27 PM

## 2019-08-08 ENCOUNTER — Inpatient Hospital Stay: Payer: Medicare Other

## 2019-08-08 VITALS — BP 128/65 | HR 93 | Temp 97.0°F | Resp 18

## 2019-08-08 DIAGNOSIS — C9310 Chronic myelomonocytic leukemia not having achieved remission: Secondary | ICD-10-CM

## 2019-08-08 DIAGNOSIS — R0602 Shortness of breath: Secondary | ICD-10-CM | POA: Diagnosis not present

## 2019-08-08 DIAGNOSIS — I471 Supraventricular tachycardia: Secondary | ICD-10-CM | POA: Diagnosis not present

## 2019-08-08 DIAGNOSIS — Z801 Family history of malignant neoplasm of trachea, bronchus and lung: Secondary | ICD-10-CM | POA: Diagnosis not present

## 2019-08-08 DIAGNOSIS — Z5111 Encounter for antineoplastic chemotherapy: Secondary | ICD-10-CM | POA: Diagnosis not present

## 2019-08-08 DIAGNOSIS — Z809 Family history of malignant neoplasm, unspecified: Secondary | ICD-10-CM | POA: Diagnosis not present

## 2019-08-08 DIAGNOSIS — D649 Anemia, unspecified: Secondary | ICD-10-CM | POA: Diagnosis not present

## 2019-08-08 DIAGNOSIS — Z79899 Other long term (current) drug therapy: Secondary | ICD-10-CM | POA: Diagnosis not present

## 2019-08-08 MED ORDER — AZACITIDINE CHEMO SQ INJECTION
50.0000 mg/m2 | Freq: Once | INTRAMUSCULAR | Status: AC
  Administered 2019-08-08: 132.5 mg via SUBCUTANEOUS
  Filled 2019-08-08: qty 5.3

## 2019-08-08 MED ORDER — ONDANSETRON HCL 4 MG PO TABS
8.0000 mg | ORAL_TABLET | Freq: Once | ORAL | Status: AC
  Administered 2019-08-08: 8 mg via ORAL
  Filled 2019-08-08: qty 2

## 2019-08-11 ENCOUNTER — Telehealth: Payer: Self-pay | Admitting: *Deleted

## 2019-08-11 ENCOUNTER — Inpatient Hospital Stay: Payer: Medicare Other

## 2019-08-11 NOTE — Telephone Encounter (Signed)
Pt called Samuel Frank for a refill of med. He threw bottle away and needs refill. It is pink pill is all he knows. They tried calling pharmacy and they said they can't help with out bottle or name of med. I called him and got voice mail and left message- I can try to help but not sure what to do after him and Samuel Frank went through all the ones that dr Janese Banks orders for him and he had all of them

## 2019-08-12 ENCOUNTER — Telehealth: Payer: Self-pay | Admitting: *Deleted

## 2019-08-12 DIAGNOSIS — C9310 Chronic myelomonocytic leukemia not having achieved remission: Secondary | ICD-10-CM

## 2019-08-12 MED ORDER — PROCHLORPERAZINE MALEATE 10 MG PO TABS: 10.0000 mg | ORAL_TABLET | Freq: Four times a day (QID) | ORAL | 2 refills | Status: DC | PRN

## 2019-08-12 NOTE — Telephone Encounter (Signed)
Pt nauseated for a few days and would like nausea med. spoek to rao and can add compazine on. Will send to pharmacy

## 2019-08-14 ENCOUNTER — Telehealth: Payer: Self-pay | Admitting: *Deleted

## 2019-08-14 NOTE — Telephone Encounter (Signed)
Patient left message that he is sob when walking short distance. He has to take rest and then walk some more. I called and spoke to Janese Banks and told her about SOB and he can come in tom. Am 8:30. I called him and he will be here in am

## 2019-08-15 ENCOUNTER — Encounter: Payer: Self-pay | Admitting: Oncology

## 2019-08-15 ENCOUNTER — Inpatient Hospital Stay (HOSPITAL_BASED_OUTPATIENT_CLINIC_OR_DEPARTMENT_OTHER): Payer: Medicare Other | Admitting: Oncology

## 2019-08-15 ENCOUNTER — Inpatient Hospital Stay: Payer: Medicare Other

## 2019-08-15 ENCOUNTER — Other Ambulatory Visit: Payer: Self-pay

## 2019-08-15 ENCOUNTER — Ambulatory Visit
Admission: RE | Admit: 2019-08-15 | Discharge: 2019-08-15 | Disposition: A | Discharge status: Home / Self Care | Payer: Medicare Other | Source: Ambulatory Visit | Attending: Oncology | Admitting: Oncology

## 2019-08-15 VITALS — BP 96/58 | HR 115 | Temp 95.7°F | Resp 16 | Wt 316.5 lb

## 2019-08-15 DIAGNOSIS — D649 Anemia, unspecified: Secondary | ICD-10-CM | POA: Diagnosis not present

## 2019-08-15 DIAGNOSIS — Z5111 Encounter for antineoplastic chemotherapy: Secondary | ICD-10-CM | POA: Diagnosis not present

## 2019-08-15 DIAGNOSIS — D509 Iron deficiency anemia, unspecified: Secondary | ICD-10-CM

## 2019-08-15 DIAGNOSIS — R0602 Shortness of breath: Secondary | ICD-10-CM | POA: Insufficient documentation

## 2019-08-15 DIAGNOSIS — Z801 Family history of malignant neoplasm of trachea, bronchus and lung: Secondary | ICD-10-CM | POA: Diagnosis not present

## 2019-08-15 DIAGNOSIS — R000 Tachycardia, unspecified: Secondary | ICD-10-CM

## 2019-08-15 DIAGNOSIS — I471 Supraventricular tachycardia: Secondary | ICD-10-CM | POA: Diagnosis not present

## 2019-08-15 DIAGNOSIS — C9310 Chronic myelomonocytic leukemia not having achieved remission: Secondary | ICD-10-CM | POA: Diagnosis not present

## 2019-08-15 DIAGNOSIS — Z809 Family history of malignant neoplasm, unspecified: Secondary | ICD-10-CM | POA: Diagnosis not present

## 2019-08-15 DIAGNOSIS — Z79899 Other long term (current) drug therapy: Secondary | ICD-10-CM | POA: Diagnosis not present

## 2019-08-15 DIAGNOSIS — R0609 Other forms of dyspnea: Secondary | ICD-10-CM | POA: Diagnosis not present

## 2019-08-15 LAB — CBC WITH DIFFERENTIAL/PLATELET
Abs Immature Granulocytes: 0.2 10*3/uL — ABNORMAL HIGH (ref 0.00–0.07)
Band Neutrophils: 3 %
Basophils Absolute: 0.7 10*3/uL — ABNORMAL HIGH (ref 0.0–0.1)
Basophils Relative: 4 %
Eosinophils Absolute: 0.7 10*3/uL — ABNORMAL HIGH (ref 0.0–0.5)
Eosinophils Relative: 4 %
HCT: 25.5 % — ABNORMAL LOW (ref 39.0–52.0)
Hemoglobin: 7.8 g/dL — ABNORMAL LOW (ref 13.0–17.0)
Lymphocytes Relative: 14 %
Lymphs Abs: 2.3 10*3/uL (ref 0.7–4.0)
MCH: 26.7 pg (ref 26.0–34.0)
MCHC: 30.6 g/dL (ref 30.0–36.0)
MCV: 87.3 fL (ref 80.0–100.0)
Metamyelocytes Relative: 1 %
Monocytes Absolute: 1.3 10*3/uL — ABNORMAL HIGH (ref 0.1–1.0)
Monocytes Relative: 8 %
Neutro Abs: 11.4 10*3/uL — ABNORMAL HIGH (ref 1.7–7.7)
Neutrophils Relative %: 66 %
Platelets: 539 10*3/uL — ABNORMAL HIGH (ref 150–400)
RBC: 2.92 MIL/uL — ABNORMAL LOW (ref 4.22–5.81)
RDW: 23.9 % — ABNORMAL HIGH (ref 11.5–15.5)
Smear Review: INCREASED
WBC: 16.5 10*3/uL — ABNORMAL HIGH (ref 4.0–10.5)
nRBC: 0.2 % (ref 0.0–0.2)

## 2019-08-15 LAB — IRON AND TIBC
Iron: 25 ug/dL — ABNORMAL LOW (ref 45–182)
Saturation Ratios: 10 % — ABNORMAL LOW (ref 17.9–39.5)
TIBC: 252 ug/dL (ref 250–450)
UIBC: 227 ug/dL

## 2019-08-15 LAB — FERRITIN: Ferritin: 28 ng/mL (ref 24–336)

## 2019-08-15 LAB — TROPONIN I (HIGH SENSITIVITY): Troponin I (High Sensitivity): 7 ng/L (ref <18)

## 2019-08-15 MED ORDER — IOHEXOL 350 MG/ML SOLN
75.0000 mL | Freq: Once | INTRAVENOUS | Status: AC | PRN
  Administered 2019-08-15: 75 mL via INTRAVENOUS

## 2019-08-15 NOTE — Progress Notes (Signed)
Pt having sob on exertion after walking no more than 75 feet. He has dizziness at times. B/p low today and he was not dizzy. His sat upon sitting 100% and heart rate 111, upon walking the sat was 99 to 100% and his heart rate 149.  Once he sat back down his heart rate was 119 to 116. He says he has had several colonoscopies and they thought bleeding but never found anything

## 2019-08-17 ENCOUNTER — Telehealth: Payer: Self-pay | Admitting: *Deleted

## 2019-08-17 NOTE — Telephone Encounter (Signed)
Dr. Janese Banks had wanted me to call and let him know that he did not have a blood clot in his lungs and the troponin level to check to make sure he was not having heart issues and it was normal. His hgb came down to 7.8 and probably that is causing your sob on exertion and the heart rate inc. With exertion. She still wants him to have ECHO to check to make sure nothing wrong with his heart and it is scheduled for 6/15 arrive [facility] 8:45 for a 9 am ECHO, she also wants him to get iron treatment again he had 2 doses in March and she wants him to have 2 more. His appt 6/14 at 1:30. When he gets here. I will give him his appts schedule. He is agreeable to this.

## 2019-08-17 NOTE — Progress Notes (Signed)
Hematology/Oncology Consult note [facility]  Telephone:([phone] [phone]  Patient Care Team: Lavera Guise, MD as PCP - General (Internal Medicine) Sindy Guadeloupe, MD as Consulting Physician (Oncology)   Name of the patient: Samuel Frank  [id number]  03/19/1940   Date of visit: 08/17/19  Diagnosis-CMML 1  Chief complaint/ Reason for visit-acute visit for ongoing shortness of breath  Heme/Onc history:  Patient is a 79 yr old male with no significant co morbidities other than hypertension. He has recently been having bilateral knee pain and has undergone steroid injection in his knee.Patient was noted to have a high white count on his routine exam. His white count was 19.2 on 07/22/2018 with an H&H of 11.4/35.1 and a platelet count of 470. At that point he was prescribed a course of antibiotics and a repeat blood work on 08/21/2018 showed white count of 42.3, H&H of 7.1/23 with an MCV of 84 and a platelet count of 791 and hence the patient was referred to hematology for further management.  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 In addition the core biopsy showed a small focus of fibrosis containing scattering of mast cells and eosinophils most suggestive of systemic mastocytosis.bone marrow blasts 7%.Flow cytometry showed increased number of monocytic cells representing 16% of all cells with expression for HLA-DR, CD11b, CD13, CD14, CD33, CD38, CD56 and CD56 and CD64 with [facility] CD 117 are CD34.normal cytogenetics, CBL, SRSF2, SH2B3 and TET2  Repeat bone marrow biopsy after 4 cycles of Vidaza showed less pronounced monocytic/granulocytic proliferation and overall better granulopoiesis and no increase in blasts. In addition features of systemic mastocytosis not present.  After 9 cycles of Vidaza patient was noted to have significant anemia and worsening thrombocytosis for which a repeat  bone marrow biopsy was done. Bone marrow did not show any features of progression of CMML. Overall stable findings and no increased blast.He was found to have iron deficiency and received 2 doses of Feraheme.   Interval history-patient reports having exertional shortness of breath for the last few days.  He feels fine at rest but feels winded when he walks around.  Denies any chest pain headaches, focal tingling numbness or weakness.  Denies any blood in his stool or dark melanotic stools.  ECOG PS- 1 Pain scale- 0   Review of systems- Review of Systems  Constitutional: Positive for malaise/fatigue. Negative for chills, fever and weight loss.  HENT: Negative for congestion, ear discharge and nosebleeds.   Eyes: Negative for blurred vision.  Respiratory: Positive for shortness of breath. Negative for cough, hemoptysis, sputum production and wheezing.   Cardiovascular: Negative for chest pain, palpitations, orthopnea and claudication.  Gastrointestinal: Negative for abdominal pain, blood in stool, constipation, diarrhea, heartburn, melena, nausea and vomiting.  Genitourinary: Negative for dysuria, flank pain, frequency, hematuria and urgency.  Musculoskeletal: Negative for back pain, joint pain and myalgias.  Skin: Negative for rash.  Neurological: Negative for dizziness, tingling, focal weakness, seizures, weakness and headaches.  Endo/Heme/Allergies: Does not bruise/bleed easily.  Psychiatric/Behavioral: Negative for depression and suicidal ideas. The patient does not have insomnia.       No Known Allergies   Past Medical History:  Diagnosis Date  . Anemia   . CMML (chronic myelomonocytic leukemia) ([place])   . Hypertension      Past Surgical History:  Procedure Laterality Date  . COLONOSCOPY WITH PROPOFOL N/A 05/03/2015   Procedure: COLONOSCOPY WITH PROPOFOL;  Surgeon:  [name], MD;  Location: [facility] ENDOSCOPY;  Service: Gastroenterology;  Laterality: N/A;  .  ESOPHAGOGASTRODUODENOSCOPY (EGD) WITH PROPOFOL N/A 05/03/2015   Procedure: ESOPHAGOGASTRODUODENOSCOPY (EGD) WITH PROPOFOL;  Surgeon: [name], MD;  Location: [facility] ENDOSCOPY;  Service: Gastroenterology;  Laterality: N/A;  . HERNIA REPAIR     1975    Social History   Socioeconomic History  . Marital status: Divorced    Spouse name: Not on file  . Number of children: 4  . Years of education: Not on file  . Highest education level: Not on file  Occupational History  . Occupation: retired    Comment: [employer]  Tobacco Use  . Smoking status: Never Smoker  . Smokeless tobacco: Never Used  Vaping Use  . Vaping Use: Never used  Substance and Sexual Activity  . Alcohol use: No  . Drug use: No  . Sexual activity: Not on file  Other Topics Concern  . Not on file  Social History Narrative  . Not on file   Social Determinants of Health   Financial Resource Strain: Low Risk   . Difficulty of Paying Living Expenses: Not hard at all  Food Insecurity: No Food Insecurity  . Worried About Charity fundraiser in the Last Year: Never true  . Ran Out of Food in the Last Year: Never true  Transportation Needs: No Transportation Needs  . Lack of Transportation (Medical): No  . Lack of Transportation (Non-Medical): No  Physical Activity: Sufficiently Active  . Days of Exercise per Week: 3 days  . Minutes of Exercise per Session: 150+ min  Stress: No Stress Concern Present  . Feeling of Stress : Not at all  Social Connections: Moderately Isolated  . Frequency of Communication with Friends and Family: More than three times a week  . Frequency of Social Gatherings with Friends and Family: More than three times a week  . Attends Religious Services: More than 4 times per year  . Active Member of Clubs or Organizations: No  . Attends Archivist Meetings: Never  . Marital Status: Divorced  Human resources officer Violence:   . Fear of Current or Ex-Partner:   . Emotionally  Abused:   [name] Physically Abused:   . Sexually Abused:     Family History  Problem Relation Age of Onset  . Alzheimer's disease Father   . Lung cancer Sister   . Cancer Sister      Current Outpatient Medications:  .  allopurinol (ZYLOPRIM) 300 MG tablet, TAKE 1 TABLET BY MOUTH EVERY DAY, Disp: 30 tablet, Rfl: 3 .  aspirin EC 81 MG tablet, Take 81 mg by mouth daily., Disp: , Rfl:  .  Cyanocobalamin (B-12 PO), Take 3,000 mcg by mouth daily., Disp: , Rfl:  .  cyanocobalamin 500 MCG tablet, Take 500 mcg by mouth daily., Disp: , Rfl:  .  cyclobenzaprine (FLEXERIL) 10 MG tablet, Take 1 tablet by mouth., Disp: , Rfl:  .  ibuprofen (ADVIL,MOTRIN) 200 MG tablet, Take 200 mg by mouth every 6 (six) hours as needed. , Disp: , Rfl:  .  KLOR-CON M20 20 MEQ tablet, TAKE 1 TABLET BY MOUTH TWICE A DAY, Disp: 60 tablet, Rfl: 0 .  ondansetron (ZOFRAN) 8 MG tablet, Take 1 tablet (8 mg total) by mouth 2 (two) times daily as needed (Nausea or vomiting)., Disp: 30 tablet, Rfl: 1 .  pantoprazole (PROTONIX) 40 MG tablet, Take 1 tablet (40 mg total) by mouth daily., Disp: 90  tablet, Rfl: 1 .  prochlorperazine (COMPAZINE) 10 MG tablet, Take 1 tablet (10 mg total) by mouth every 6 (six) hours as needed (Nausea or vomiting)., Disp: 30 tablet, Rfl: 2 .  tiZANidine (ZANAFLEX) 4 MG tablet, Take 4 mg by mouth every 6 (six) hours as needed. , Disp: , Rfl:  .  valACYclovir (VALTREX) 500 MG tablet, Take 1 tablet (500 mg total) by mouth daily., Disp: 90 tablet, Rfl: 1 .  valsartan-hydrochlorothiazide (DIOVAN-HCT) 320-25 MG tablet, Take 1/2 tablet po QD, Disp: 90 tablet, Rfl: 2 .  colchicine 0.6 MG tablet, Take 2 tablets (1.54m) by mouth at first sign of gout flare followed by 1 tablet (0.6332m after 1 hour. (Max 1.32m54mithin 1 hour) (Patient not taking: Reported on 08/15/2019), Disp: , Rfl:   Physical exam:  Vitals:   08/15/19 0823  BP: (!) 96/58  Pulse: (!) 115  Resp: 16  Temp: (!) 95.7 F (35.4 C)  TempSrc: Tympanic    SpO2: 100%  Weight: (!) 316 lb 8 oz (143.6 kg)   Physical Exam Cardiovascular:     Rate and Rhythm: Regular rhythm. Tachycardia present.     Heart sounds: Normal heart sounds.  Pulmonary:     Effort: Pulmonary effort is normal.     Breath sounds: Normal breath sounds.  Abdominal:     General: Bowel sounds are normal.     Palpations: Abdomen is soft.  Skin:    General: Skin is warm and dry.  Neurological:     Mental Status: He is alert and oriented to person, place, and time.      CMP Latest Ref Rng & Units 08/05/2019  Glucose 70 - 99 mg/dL 106(H)  BUN 8 - 23 mg/dL 10  Creatinine 0.61 - 1.24 mg/dL 0.81  Sodium 135 - 145 mmol/L 138  Potassium 3.5 - 5.1 mmol/L 3.9  Chloride 98 - 111 mmol/L 103  CO2 22 - 32 mmol/L 27  Calcium 8.9 - 10.3 mg/dL 8.9  Total Protein 6.5 - 8.1 g/dL 7.7  Total Bilirubin 0.3 - 1.2 mg/dL 0.5  Alkaline Phos 38 - 126 U/L 91  AST 15 - 41 U/L 12(L)  ALT 0 - 44 U/L 10   CBC Latest Ref Rng & Units 08/15/2019  WBC 4.0 - 10.5 K/uL 16.5(H)  Hemoglobin 13.0 - 17.0 g/dL 7.8(L)  Hematocrit 39 - 52 % 25.5(L)  Platelets 150 - 400 K/uL 539(H)    No images are attached to the encounter.  CT ANGIO CHEST PE W OR WO CONTRAST  Result Date: 08/15/2019 CLINICAL DATA:  Dyspnea on exertion. Tachycardia. Recently diagnosed CMML. EXAM: CT ANGIOGRAPHY CHEST WITH CONTRAST TECHNIQUE: Multidetector CT imaging of the chest was performed using the standard protocol during bolus administration of intravenous contrast. Multiplanar CT image reconstructions and MIPs were obtained to evaluate the vascular anatomy. CONTRAST:  73m40mNIPAQUE IOHEXOL 350 MG/ML SOLN COMPARISON:  05/06/2017 chest radiograph. FINDINGS: Cardiovascular: The study is high quality for the evaluation of pulmonary embolism. There are no filling defects in the central, lobar, segmental or subsegmental pulmonary artery branches to suggest acute pulmonary embolism. Atherosclerotic nonaneurysmal thoracic aorta. Normal  caliber main pulmonary artery. Normal heart size. No significant pericardial fluid/thickening. Left anterior descending and left circumflex coronary atherosclerosis. Mediastinum/Nodes: No discrete thyroid nodules. Unremarkable esophagus. No pathologically enlarged axillary, mediastinal or hilar lymph nodes. Lungs/Pleura: No pneumothorax. No pleural effusion. No acute consolidative airspace disease, lung masses or significant pulmonary nodules. Mild ground-glass opacity and reticulation in dependent lower lungs, nonspecific, favor  hypoventilatory change. Mild diffuse bronchial wall thickening. Upper abdomen: Small hiatal hernia. Musculoskeletal: No aggressive appearing focal osseous lesions. Marked thoracic spondylosis. Review of the MIP images confirms the above findings. IMPRESSION: 1. No pulmonary embolism. 2. Nonspecific mild diffuse bronchial wall thickening, as can be seen with chronic bronchitis or reactive airways disease. 3. Two vessel coronary atherosclerosis. 4. Small hiatal hernia. 5. Aortic Atherosclerosis (ICD10-I70.0). These results will be called to the ordering clinician or representative by the Radiology Department at the imaging location. Electronically Signed   By: Ilona Sorrel M.D.   On: 08/15/2019 10:45     Assessment and plan- Patient is a 79 y.o. male with history of CMML 1 who is currently getting Vidaza 5 days every month.  Last treatment was about 2 weeks ago.  He presents here for an acute visit for ongoing shortness of breath for the last few days  Shortness of breath: We obtained a stat CTA of the chest today to rule out pulmonary embolism and did not reveal any evidence of PE.He was noted to have nonspecific bronchial wall thickening and seen with chronic bronchitis.  He does not have any signs and symptoms of infection.  I am also getting an echocardiogram to rule out any cardiac cause of shortness of breath.  We did check a troponin in our office today which was normal.  Patient  has been instructed to go to the ER if his shortness of breath does not improve.  Patient has been advised to take plenty of oral fluids at home.  He does not report any dizziness today. His blood pressure usually runs in the 120s and is lower at 96 today  Labs today reveal Anemia with a hemoglobin of 7.8.  Ferritin is relatively low at 28 with a low iron saturation of 10%.  I will plan to give him 2 more doses of Feraheme at this time since there is some evidence of iron deficiency anemia which has been ongoing for the last few months.  In fact he has required [facility] in April 2021 as well.  We will get in touch with [facility] GI to see if he can undergo GI evaluation for this.  He has undergone colonoscopies for the same reason with them in the past.  Last colonoscopy was in 2017   Visit Diagnosis 1. SOB (shortness of breath) on exertion   2. Paroxysmal supraventricular tachycardia ([place])   3. Increased heart rate   4. 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 Randa Evens, MD, MPH [facility] [phone] 08/17/2019 7:00 AM

## 2019-08-18 ENCOUNTER — Inpatient Hospital Stay: Payer: Medicare Other

## 2019-08-18 ENCOUNTER — Other Ambulatory Visit: Payer: Self-pay

## 2019-08-18 ENCOUNTER — Other Ambulatory Visit: Payer: Self-pay | Admitting: Oncology

## 2019-08-18 VITALS — BP 87/49 | HR 97 | Temp 96.0°F

## 2019-08-18 DIAGNOSIS — Z79899 Other long term (current) drug therapy: Secondary | ICD-10-CM | POA: Diagnosis not present

## 2019-08-18 DIAGNOSIS — Z5111 Encounter for antineoplastic chemotherapy: Secondary | ICD-10-CM | POA: Diagnosis not present

## 2019-08-18 DIAGNOSIS — R0602 Shortness of breath: Secondary | ICD-10-CM

## 2019-08-18 DIAGNOSIS — C9310 Chronic myelomonocytic leukemia not having achieved remission: Secondary | ICD-10-CM | POA: Diagnosis not present

## 2019-08-18 DIAGNOSIS — Z809 Family history of malignant neoplasm, unspecified: Secondary | ICD-10-CM | POA: Diagnosis not present

## 2019-08-18 DIAGNOSIS — D649 Anemia, unspecified: Secondary | ICD-10-CM | POA: Diagnosis not present

## 2019-08-18 DIAGNOSIS — D509 Iron deficiency anemia, unspecified: Secondary | ICD-10-CM

## 2019-08-18 DIAGNOSIS — Z801 Family history of malignant neoplasm of trachea, bronchus and lung: Secondary | ICD-10-CM | POA: Diagnosis not present

## 2019-08-18 DIAGNOSIS — I471 Supraventricular tachycardia: Secondary | ICD-10-CM | POA: Diagnosis not present

## 2019-08-18 MED ORDER — SODIUM CHLORIDE 0.9 % IV SOLN
510.0000 mg | Freq: Once | INTRAVENOUS | Status: AC
  Administered 2019-08-18: 510 mg via INTRAVENOUS
  Filled 2019-08-18: qty 510

## 2019-08-18 MED ORDER — SODIUM CHLORIDE 0.9 % IV SOLN
Freq: Once | INTRAVENOUS | Status: AC
  Filled 2019-08-18: qty 250

## 2019-08-18 NOTE — Progress Notes (Signed)
Pt tolerated infusion well.  B/P 87/49, pt denies any symptoms, no s/s of distress or reaction noted. Dr. Janese Banks aware. Per Dr. Janese Banks okay to discharge pt, pt to call clinic if any concerns arises, Pt verbalizes understanding. Pt stable at discharge.

## 2019-08-19 ENCOUNTER — Other Ambulatory Visit: Payer: Self-pay | Admitting: *Deleted

## 2019-08-19 ENCOUNTER — Ambulatory Visit
Admission: RE | Admit: 2019-08-19 | Discharge: 2019-08-19 | Disposition: A | Discharge status: Home / Self Care | Payer: Medicare Other | Source: Ambulatory Visit | Attending: Oncology | Admitting: Oncology

## 2019-08-19 DIAGNOSIS — R0602 Shortness of breath: Secondary | ICD-10-CM | POA: Insufficient documentation

## 2019-08-19 DIAGNOSIS — I119 Hypertensive heart disease without heart failure: Secondary | ICD-10-CM | POA: Diagnosis not present

## 2019-08-19 DIAGNOSIS — C9310 Chronic myelomonocytic leukemia not having achieved remission: Secondary | ICD-10-CM | POA: Diagnosis not present

## 2019-08-19 DIAGNOSIS — I358 Other nonrheumatic aortic valve disorders: Secondary | ICD-10-CM | POA: Diagnosis not present

## 2019-08-19 DIAGNOSIS — R000 Tachycardia, unspecified: Secondary | ICD-10-CM | POA: Diagnosis not present

## 2019-08-19 DIAGNOSIS — D509 Iron deficiency anemia, unspecified: Secondary | ICD-10-CM

## 2019-08-19 NOTE — Progress Notes (Signed)
*  PRELIMINARY RESULTS* Echocardiogram 2D Echocardiogram has been performed.  Samuel Frank 08/19/2019, 9:23 AM

## 2019-08-21 DIAGNOSIS — I10 Essential (primary) hypertension: Secondary | ICD-10-CM | POA: Diagnosis not present

## 2019-08-21 DIAGNOSIS — K921 Melena: Secondary | ICD-10-CM | POA: Diagnosis not present

## 2019-08-21 DIAGNOSIS — D500 Iron deficiency anemia secondary to blood loss (chronic): Secondary | ICD-10-CM | POA: Diagnosis not present

## 2019-08-21 DIAGNOSIS — C9310 Chronic myelomonocytic leukemia not having achieved remission: Secondary | ICD-10-CM | POA: Diagnosis not present

## 2019-08-22 ENCOUNTER — Other Ambulatory Visit: Payer: Self-pay

## 2019-08-22 DIAGNOSIS — K219 Gastro-esophageal reflux disease without esophagitis: Secondary | ICD-10-CM

## 2019-08-22 MED ORDER — PANTOPRAZOLE SODIUM 40 MG PO TBEC: 40.0000 mg | DELAYED_RELEASE_TABLET | Freq: Every day | ORAL | 1 refills | Status: DC

## 2019-08-25 ENCOUNTER — Ambulatory Visit: Payer: Medicare Other

## 2019-08-26 ENCOUNTER — Other Ambulatory Visit: Payer: Self-pay

## 2019-08-26 ENCOUNTER — Other Ambulatory Visit
Admission: RE | Admit: 2019-08-26 | Discharge: 2019-08-26 | Disposition: A | Discharge status: Home / Self Care | Payer: Medicare Other | Source: Ambulatory Visit | Attending: Internal Medicine | Admitting: Internal Medicine

## 2019-08-26 ENCOUNTER — Inpatient Hospital Stay: Payer: Medicare Other

## 2019-08-26 VITALS — BP 111/72 | HR 94 | Temp 99.3°F | Resp 18

## 2019-08-26 DIAGNOSIS — D509 Iron deficiency anemia, unspecified: Secondary | ICD-10-CM

## 2019-08-26 DIAGNOSIS — C9310 Chronic myelomonocytic leukemia not having achieved remission: Secondary | ICD-10-CM | POA: Diagnosis not present

## 2019-08-26 DIAGNOSIS — Z809 Family history of malignant neoplasm, unspecified: Secondary | ICD-10-CM | POA: Diagnosis not present

## 2019-08-26 DIAGNOSIS — Z20822 Contact with and (suspected) exposure to covid-19: Secondary | ICD-10-CM | POA: Insufficient documentation

## 2019-08-26 DIAGNOSIS — I471 Supraventricular tachycardia: Secondary | ICD-10-CM | POA: Diagnosis not present

## 2019-08-26 DIAGNOSIS — Z01812 Encounter for preprocedural laboratory examination: Secondary | ICD-10-CM | POA: Insufficient documentation

## 2019-08-26 DIAGNOSIS — R0602 Shortness of breath: Secondary | ICD-10-CM | POA: Diagnosis not present

## 2019-08-26 DIAGNOSIS — Z79899 Other long term (current) drug therapy: Secondary | ICD-10-CM | POA: Diagnosis not present

## 2019-08-26 DIAGNOSIS — Z801 Family history of malignant neoplasm of trachea, bronchus and lung: Secondary | ICD-10-CM | POA: Diagnosis not present

## 2019-08-26 DIAGNOSIS — Z5111 Encounter for antineoplastic chemotherapy: Secondary | ICD-10-CM | POA: Diagnosis not present

## 2019-08-26 DIAGNOSIS — D649 Anemia, unspecified: Secondary | ICD-10-CM | POA: Diagnosis not present

## 2019-08-26 LAB — SARS CORONAVIRUS 2 (TAT 6-24 HRS): SARS Coronavirus 2: NEGATIVE

## 2019-08-26 MED ORDER — SODIUM CHLORIDE 0.9 % IV SOLN
510.0000 mg | Freq: Once | INTRAVENOUS | Status: AC
  Administered 2019-08-26: 510 mg via INTRAVENOUS
  Filled 2019-08-26: qty 510

## 2019-08-26 MED ORDER — SODIUM CHLORIDE 0.9 % IV SOLN
Freq: Once | INTRAVENOUS | Status: AC
  Filled 2019-08-26: qty 250

## 2019-08-28 ENCOUNTER — Ambulatory Visit
Admission: RE | Admit: 2019-08-28 | Discharge: 2019-08-28 | Disposition: A | Discharge status: Home / Self Care | Payer: Medicare Other | Attending: Internal Medicine | Admitting: Internal Medicine

## 2019-08-28 ENCOUNTER — Encounter: Payer: Self-pay | Admitting: Internal Medicine

## 2019-08-28 ENCOUNTER — Ambulatory Visit: Payer: Medicare Other | Admitting: Anesthesiology

## 2019-08-28 ENCOUNTER — Other Ambulatory Visit: Payer: Self-pay

## 2019-08-28 ENCOUNTER — Encounter
Admission: RE | Disposition: A | Discharge status: Home / Self Care | Payer: Self-pay | Source: Home / Self Care | Attending: Internal Medicine

## 2019-08-28 DIAGNOSIS — Z79899 Other long term (current) drug therapy: Secondary | ICD-10-CM | POA: Insufficient documentation

## 2019-08-28 DIAGNOSIS — C9310 Chronic myelomonocytic leukemia not having achieved remission: Secondary | ICD-10-CM | POA: Diagnosis not present

## 2019-08-28 DIAGNOSIS — K219 Gastro-esophageal reflux disease without esophagitis: Secondary | ICD-10-CM | POA: Diagnosis not present

## 2019-08-28 DIAGNOSIS — I10 Essential (primary) hypertension: Secondary | ICD-10-CM | POA: Insufficient documentation

## 2019-08-28 DIAGNOSIS — K2950 Unspecified chronic gastritis without bleeding: Secondary | ICD-10-CM | POA: Diagnosis not present

## 2019-08-28 DIAGNOSIS — D500 Iron deficiency anemia secondary to blood loss (chronic): Secondary | ICD-10-CM | POA: Diagnosis not present

## 2019-08-28 DIAGNOSIS — K648 Other hemorrhoids: Secondary | ICD-10-CM | POA: Diagnosis not present

## 2019-08-28 DIAGNOSIS — K640 First degree hemorrhoids: Secondary | ICD-10-CM | POA: Diagnosis not present

## 2019-08-28 DIAGNOSIS — K3189 Other diseases of stomach and duodenum: Secondary | ICD-10-CM | POA: Insufficient documentation

## 2019-08-28 DIAGNOSIS — K2960 Other gastritis without bleeding: Secondary | ICD-10-CM | POA: Diagnosis not present

## 2019-08-28 DIAGNOSIS — Z7982 Long term (current) use of aspirin: Secondary | ICD-10-CM | POA: Insufficient documentation

## 2019-08-28 DIAGNOSIS — K2930 Chronic superficial gastritis without bleeding: Secondary | ICD-10-CM | POA: Diagnosis not present

## 2019-08-28 DIAGNOSIS — K921 Melena: Secondary | ICD-10-CM | POA: Insufficient documentation

## 2019-08-28 HISTORY — DX: Dyspnea, unspecified: R06.00

## 2019-08-28 HISTORY — PX: COLONOSCOPY: SHX5424

## 2019-08-28 HISTORY — PX: ESOPHAGOGASTRODUODENOSCOPY: SHX5428

## 2019-08-28 SURGERY — EGD (ESOPHAGOGASTRODUODENOSCOPY)
Anesthesia: General

## 2019-08-28 MED ORDER — LIDOCAINE HCL (PF) 2 % IJ SOLN
INTRAMUSCULAR | Status: AC
  Filled 2019-08-28: qty 5

## 2019-08-28 MED ORDER — SODIUM CHLORIDE 0.9 % IV SOLN
INTRAVENOUS | Status: DC
  Administered 2019-08-28: 1000 mL via INTRAVENOUS

## 2019-08-28 MED ORDER — PROPOFOL 500 MG/50ML IV EMUL
INTRAVENOUS | Status: AC
  Filled 2019-08-28: qty 50

## 2019-08-28 MED ORDER — PHENYLEPHRINE HCL (PRESSORS) 10 MG/ML IV SOLN
INTRAVENOUS | Status: DC | PRN
Start: 2019-08-28 — End: 2019-08-28
  Administered 2019-08-28: 200 ug via INTRAVENOUS

## 2019-08-28 MED ORDER — PROPOFOL 500 MG/50ML IV EMUL
INTRAVENOUS | Status: DC | PRN
  Administered 2019-08-28: 100 mg via INTRAVENOUS
  Administered 2019-08-28: 80 mg via INTRAVENOUS
  Administered 2019-08-28 (×2): 70 mg via INTRAVENOUS

## 2019-08-28 MED ORDER — LIDOCAINE HCL (CARDIAC) PF 100 MG/5ML IV SOSY
PREFILLED_SYRINGE | INTRAVENOUS | Status: DC | PRN
  Administered 2019-08-28: 100 mg via INTRAVENOUS

## 2019-08-28 NOTE — Anesthesia Preprocedure Evaluation (Signed)
Anesthesia Evaluation  Patient identified by MRN, date of birth, ID band Patient awake    Reviewed: Allergy & Precautions, H&P , NPO status , Patient's Chart, lab work & pertinent test results, reviewed documented beta blocker date and time   History of Anesthesia Complications Negative for: history of anesthetic complications  Airway Mallampati: II  TM Distance: >3 FB Neck ROM: full    Dental  (+) Partial Upper, Poor Dentition, Chipped   Pulmonary neg pulmonary ROS,    Pulmonary exam normal        Cardiovascular Exercise Tolerance: Good hypertension, (-) anginaNormal cardiovascular exam     Neuro/Psych negative neurological ROS  negative psych ROS   GI/Hepatic Neg liver ROS, GERD  ,  Endo/Other  negative endocrine ROS  Renal/GU negative Renal ROS  negative genitourinary   Musculoskeletal  (+) Arthritis ,   Abdominal   Peds  Hematology  (+) Blood dyscrasia, anemia ,   Anesthesia Other Findings Past Medical History:   Hypertension                                                 Anemia                                                     Past Surgical History:   HERNIA REPAIR                                                   Comment:1975 BMI    Body Mass Index   39.62 kg/m 2     Reproductive/Obstetrics negative OB ROS                             Anesthesia Physical  Anesthesia Plan  ASA: III  Anesthesia Plan: General   Post-op Pain Management:    Induction: Intravenous  PONV Risk Score and Plan: Propofol infusion and TIVA  Airway Management Planned: Natural Airway and Nasal Cannula  Additional Equipment:   Intra-op Plan:   Post-operative Plan:   Informed Consent: I have reviewed the patients History and Physical, chart, labs and discussed the procedure including the risks, benefits and alternatives for the proposed anesthesia with the patient or authorized  representative who has indicated his/her understanding and acceptance.     Dental Advisory Given  Plan Discussed with: CRNA  Anesthesia Plan Comments: (Patient consented for risks of anesthesia including but not limited to:  - adverse reactions to medications - risk of intubation if required - damage to eyes, teeth, lips or other oral mucosa - nerve damage due to positioning  - sore throat or hoarseness - Damage to heart, brain, nerves, lungs, other parts of body or loss of life  Patient voiced understanding.)        Anesthesia Quick Evaluation

## 2019-08-28 NOTE — Op Note (Signed)
[facility] Gastroenterology Patient Name: Hilman Kissling Procedure Date: 08/28/2019 11:17 AM MRN: [id number] Account #: [id number] Date of Birth: 1940/08/02 Admit Type: Outpatient Age: 79 Room: [facility] ENDO ROOM 1 Gender: Male Note Status: Finalized Procedure:             Colonoscopy Indications:           Iron deficiency anemia secondary to chronic blood loss Providers:             Benay Pike. Tahjanae Blankenburg MD, MD Medicines:             Propofol per Anesthesia Complications:         No immediate complications. Procedure:             Pre-Anesthesia Assessment:                        - The risks and benefits of the procedure and the                         sedation options and risks were discussed with the                         patient. All questions were answered and informed                         consent was obtained.                        - Patient identification and proposed procedure were                         verified prior to the procedure by the nurse. The                         procedure was verified in the procedure room.                        - ASA Grade Assessment: III - A patient with severe                         systemic disease.                        - After reviewing the risks and benefits, the patient                         was deemed in satisfactory condition to undergo the                         procedure.                        After obtaining informed consent, the colonoscope was                         passed under direct vision. Throughout the procedure,                         the patient's blood pressure, pulse, and oxygen  saturations were monitored continuously. The                         Colonoscope was introduced through the anus and                         advanced to the the cecum, identified by appendiceal                         orifice and ileocecal valve. The colonoscopy was                          technically difficult and complex due to restricted                         mobility of the colon, a redundant colon and                         significant looping. Successful completion of the                         procedure was aided by withdrawing and reinserting the                         scope, applying abdominal pressure and receiving                         assistance from additional staff. The patient                         tolerated the procedure well. The quality of the bowel                         preparation was adequate. The terminal ileum,                         ileocecal valve, appendiceal orifice, and rectum were                         photographed. Findings:      The perianal and digital rectal examinations were normal. Pertinent       negatives include normal sphincter tone and no palpable rectal lesions.      Non-bleeding internal hemorrhoids were found during retroflexion. The       hemorrhoids were Grade I (internal hemorrhoids that do not prolapse).      The transverse colon and ascending colon revealed significantly       excessive looping. Advancing the scope required changing the patient to       a supine position and applying abdominal pressure. Estimated blood loss:       none.      Normal mucosa was found in the entire colon.      The terminal ileum appeared normal. Impression:            - Non-bleeding internal hemorrhoids.                        - There was significant looping of the colon.                        -  Normal mucosa in the entire examined colon.                        - The examined portion of the ileum was normal.                        - No specimens collected. Recommendation:        - Patient has a contact number available for                         emergencies. The signs and symptoms of potential                         delayed complications were discussed with the patient.                         Return to normal activities  tomorrow. Written                         discharge instructions were provided to the patient.                        - Resume previous diet.                        - Continue present medications.                        - No repeat colonoscopy due to current age (63 years                         or older), the absence of colonic polyps and no                         evidence of mucosal or other abnormalities on today's                         exam.                        - To visualize the small bowel, perform video capsule                         endoscopy at appointment to be scheduled.                        - The findings and recommendations were discussed with                         the patient.                        - Await pathology results from EGD, also performed                         today. Procedure Code(s):     --- Professional ---                        [phone], Colonoscopy, flexible; diagnostic, including  collection of specimen(s) by brushing or washing, when                         performed (separate procedure) Diagnosis Code(s):     --- Professional ---                        D50.0, Iron deficiency anemia secondary to blood loss                         (chronic)                        K64.0, First degree hemorrhoids CPT copyright 2019 American Medical Association. All rights reserved. The codes documented in this report are preliminary and upon coder review may  be revised to meet current compliance requirements. Efrain Sella MD, MD 08/28/2019 12:21:01 PM This report has been signed electronically. Number of Addenda: 0 Note Initiated On: 08/28/2019 11:17 AM Scope Withdrawal Time: 0 hours 5 minutes 53 seconds  Total Procedure Duration: 0 hours 28 minutes 2 seconds  Estimated Blood Loss:  Estimated blood loss: none.      [facility]

## 2019-08-28 NOTE — Interval H&P Note (Signed)
History and Physical Interval Note:  08/28/2019 10:05 AM  Samuel [name].  has presented today for surgery, with the diagnosis of ANEMIA.  The various methods of treatment have been discussed with the patient and family. After consideration of risks, benefits and other options for treatment, the patient has consented to  Procedure(s): ESOPHAGOGASTRODUODENOSCOPY (EGD) (N/A) COLONOSCOPY (N/A) as a surgical intervention.  The patient's history has been reviewed, patient examined, no change in status, stable for surgery.  I have reviewed the patient's chart and labs.  Questions were answered to the patient's satisfaction.     [place], [place]

## 2019-08-28 NOTE — Transfer of Care (Signed)
Immediate Anesthesia Transfer of Care Note  Patient: Samuel [name].  Procedure(s) Performed: ESOPHAGOGASTRODUODENOSCOPY (EGD) (N/A ) COLONOSCOPY (N/A )  Patient Location: PACU  Anesthesia Type:General  Level of Consciousness: sedated  Airway & Oxygen Therapy: Patient Spontanous Breathing and Patient connected to face mask oxygen  Post-op Assessment: Report given to RN and Post -op Vital signs reviewed and stable  Post vital signs: Reviewed and stable  Last Vitals:  Vitals Value Taken Time  BP 101/67 08/28/19 1221  Temp 36.7 C 08/28/19 1221  Pulse 93 08/28/19 1223  Resp 29 08/28/19 1223  SpO2 100 % 08/28/19 1223  Vitals shown include unvalidated device data.  Last Pain:  Vitals:   08/28/19 1221  TempSrc: Tympanic  PainSc: Asleep         Complications: No complications documented.

## 2019-08-28 NOTE — Anesthesia Postprocedure Evaluation (Signed)
Anesthesia Post Note  Patient: Samuel [name].  Procedure(s) Performed: ESOPHAGOGASTRODUODENOSCOPY (EGD) (N/A ) COLONOSCOPY (N/A )  Patient location during evaluation: Endoscopy Anesthesia Type: General Level of consciousness: awake and alert Pain management: pain level controlled Vital Signs Assessment: post-procedure vital signs reviewed and stable Respiratory status: spontaneous breathing, nonlabored ventilation, respiratory function stable and patient connected to nasal cannula oxygen Cardiovascular status: blood pressure returned to baseline and stable Postop Assessment: no apparent nausea or vomiting Anesthetic complications: no   No complications documented.   Last Vitals:  Vitals:   08/28/19 1241 08/28/19 1246  BP: 92/60 (!) 101/56  Pulse: 86 84  Resp: (!) 22 14  Temp:    SpO2: 95% 97%    Last Pain:  Vitals:   08/28/19 1246  TempSrc:   PainSc: 0-No pain                 Precious Haws Shaquanna Lycan

## 2019-08-28 NOTE — H&P (Signed)
Outpatient short stay form Pre-procedure 08/28/2019 10:03 AM Samuel [name]. Alice Reichert, M.D.  Primary Physician: Clayborn Bigness, M.D.  Referring Physician: Randa Evens, M.D.  Reason for visit:  Iron deficiency anemia, Hematochezia  History of present illness:  Patient with hx of obscure rectal bleeding every 2-3 years has iron deficiency anemia. Patient denies change in bowel habits, rectal bleeding, weight loss or abdominal pain.  Patient denies dysphagia, abdominal pain, hemetemesis, weight loss, anorexia or melena.     No current facility-administered medications for this encounter.  Medications Prior to Admission  Medication Sig Dispense Refill Last Dose  . allopurinol (ZYLOPRIM) 300 MG tablet TAKE 1 TABLET BY MOUTH EVERY DAY 30 tablet 3   . aspirin EC 81 MG tablet Take 81 mg by mouth daily.     . colchicine 0.6 MG tablet Take 2 tablets (1.2mg ) by mouth at first sign of gout flare followed by 1 tablet (0.6mg ) after 1 hour. (Max 1.8mg  within 1 hour) (Patient not taking: Reported on 08/15/2019)     . Cyanocobalamin (B-12 PO) Take 3,000 mcg by mouth daily.     . cyanocobalamin 500 MCG tablet Take 500 mcg by mouth daily.     . cyclobenzaprine (FLEXERIL) 10 MG tablet Take 1 tablet by mouth.     [name] ibuprofen (ADVIL,MOTRIN) 200 MG tablet Take 200 mg by mouth every 6 (six) hours as needed.      [name] KLOR-CON M20 20 MEQ tablet TAKE 1 TABLET BY MOUTH TWICE A DAY 60 tablet 0   . ondansetron (ZOFRAN) 8 MG tablet Take 1 tablet (8 mg total) by mouth 2 (two) times daily as needed (Nausea or vomiting). 30 tablet 1   . pantoprazole (PROTONIX) 40 MG tablet Take 1 tablet (40 mg total) by mouth daily. 90 tablet 1   . prochlorperazine (COMPAZINE) 10 MG tablet Take 1 tablet (10 mg total) by mouth every 6 (six) hours as needed (Nausea or vomiting). 30 tablet 2   . tiZANidine (ZANAFLEX) 4 MG tablet Take 4 mg by mouth every 6 (six) hours as needed.      . valACYclovir (VALTREX) 500 MG tablet Take 1 tablet (500 mg total) by  mouth daily. 90 tablet 1   . valsartan-hydrochlorothiazide (DIOVAN-HCT) 320-25 MG tablet Take 1/2 tablet po QD 90 tablet 2      No Known Allergies   Past Medical History:  Diagnosis Date  . Anemia   . 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 Hypertension     Review of systems:  Otherwise negative.    Physical Exam  Gen: Alert, oriented. Appears stated age.  HEENT: [place]/AT. PERRLA. Lungs: CTA, no wheezes. CV: RR nl S1, S2. Abd: soft, benign, no masses. BS+ Ext: No edema. Pulses 2+    Planned procedures: Proceed with EGD and colonoscopy. The patient understands the nature of the planned procedure, indications, risks, alternatives and potential complications including but not limited to bleeding, infection, perforation, damage to internal organs and possible oversedation/side effects from anesthesia. The patient agrees and gives consent to proceed.  Please refer to procedure notes for findings, recommendations and patient disposition/instructions.     Samuel [name]. Alice Reichert, M.D. Gastroenterology 08/28/2019  10:03 AM

## 2019-08-28 NOTE — Op Note (Signed)
[facility] Gastroenterology Patient Name: Samuel [name] Procedure Date: 08/28/2019 11:19 AM MRN: [id number] Account #: [id number] Date of Birth: 1940/07/03 Admit Type: Outpatient Age: 79 Room: [facility] ENDO ROOM 1 Gender: Male Note Status: Finalized Procedure:             Upper GI endoscopy Indications:           Suspected upper gastrointestinal bleeding in patient                         with unexplained iron deficiency anemia Providers:             Benay Pike. Zaryah Seckel MD, MD Medicines:             Propofol per Anesthesia Complications:         No immediate complications. Procedure:             Pre-Anesthesia Assessment:                        - The risks and benefits of the procedure and the                         sedation options and risks were discussed with the                         patient. All questions were answered and informed                         consent was obtained.                        - Patient identification and proposed procedure were                         verified prior to the procedure by the nurse. The                         procedure was verified in the procedure room.                        - ASA Grade Assessment: III - A patient with severe                         systemic disease.                        - After reviewing the risks and benefits, the patient                         was deemed in satisfactory condition to undergo the                         procedure.                        After obtaining informed consent, the endoscope was                         passed under direct vision. Throughout the procedure,  the patient's blood pressure, pulse, and oxygen                         saturations were monitored continuously. The Endoscope                         was introduced through the mouth, and advanced to the                         third part of duodenum. The upper GI endoscopy was                          accomplished without difficulty. The patient tolerated                         the procedure well. Findings:      The esophagus was normal.      A single 10 mm submucosal papule (nodule) with no bleeding and no       stigmata of recent bleeding was found in the gastric antrum. Biopsies       were taken with a cold forceps for histology.      The cardia and gastric fundus were normal on retroflexion.      Diffuse atrophic mucosa was found in the entire examined stomach.      The examined duodenum was normal.      The exam was otherwise without abnormality. Impression:            - Normal esophagus.                        - A single submucosal papule (nodule) found in the                         stomach. Biopsied.                        - Gastric mucosal atrophy.                        - Normal examined duodenum.                        - The examination was otherwise normal. Recommendation:        - Await pathology results.                        - Proceed with colonoscopy Procedure Code(s):     --- Professional ---                        [phone], Esophagogastroduodenoscopy, flexible,                         transoral; with biopsy, single or multiple Diagnosis Code(s):     --- Professional ---                        D50.9, Iron deficiency anemia, unspecified                        K31.89, Other diseases of stomach and duodenum CPT copyright 2019 [employer]  [employer] Association. All rights reserved. The codes documented in this report are preliminary and upon coder review may  be revised to meet current compliance requirements. Efrain Sella MD, MD 08/28/2019 11:46:39 AM This report has been signed electronically. Number of Addenda: 0 Note Initiated On: 08/28/2019 11:19 AM Estimated Blood Loss:  Estimated blood loss: none.      [facility]

## 2019-08-29 ENCOUNTER — Encounter: Payer: Self-pay | Admitting: Internal Medicine

## 2019-08-31 ENCOUNTER — Other Ambulatory Visit: Payer: Self-pay | Admitting: Oncology

## 2019-09-01 ENCOUNTER — Inpatient Hospital Stay (HOSPITAL_BASED_OUTPATIENT_CLINIC_OR_DEPARTMENT_OTHER): Payer: Medicare Other | Admitting: Oncology

## 2019-09-01 ENCOUNTER — Inpatient Hospital Stay: Payer: Medicare Other

## 2019-09-01 ENCOUNTER — Other Ambulatory Visit: Payer: Self-pay

## 2019-09-01 ENCOUNTER — Encounter: Payer: Self-pay | Admitting: Oncology

## 2019-09-01 VITALS — BP 108/65 | HR 89 | Temp 97.3°F | Resp 18 | Wt 321.4 lb

## 2019-09-01 DIAGNOSIS — D649 Anemia, unspecified: Secondary | ICD-10-CM | POA: Diagnosis not present

## 2019-09-01 DIAGNOSIS — Z5111 Encounter for antineoplastic chemotherapy: Secondary | ICD-10-CM

## 2019-09-01 DIAGNOSIS — Z801 Family history of malignant neoplasm of trachea, bronchus and lung: Secondary | ICD-10-CM | POA: Diagnosis not present

## 2019-09-01 DIAGNOSIS — C9310 Chronic myelomonocytic leukemia not having achieved remission: Secondary | ICD-10-CM | POA: Diagnosis not present

## 2019-09-01 DIAGNOSIS — D509 Iron deficiency anemia, unspecified: Secondary | ICD-10-CM

## 2019-09-01 DIAGNOSIS — I471 Supraventricular tachycardia: Secondary | ICD-10-CM | POA: Diagnosis not present

## 2019-09-01 DIAGNOSIS — Z809 Family history of malignant neoplasm, unspecified: Secondary | ICD-10-CM | POA: Diagnosis not present

## 2019-09-01 DIAGNOSIS — R0602 Shortness of breath: Secondary | ICD-10-CM | POA: Diagnosis not present

## 2019-09-01 DIAGNOSIS — Z79899 Other long term (current) drug therapy: Secondary | ICD-10-CM | POA: Diagnosis not present

## 2019-09-01 LAB — COMPREHENSIVE METABOLIC PANEL
ALT: 9 U/L (ref 0–44)
AST: 11 U/L — ABNORMAL LOW (ref 15–41)
Albumin: 3.9 g/dL (ref 3.5–5.0)
Alkaline Phosphatase: 72 U/L (ref 38–126)
Anion gap: 8 (ref 5–15)
BUN: 12 mg/dL (ref 8–23)
CO2: 25 mmol/L (ref 22–32)
Calcium: 8.6 mg/dL — ABNORMAL LOW (ref 8.9–10.3)
Chloride: 105 mmol/L (ref 98–111)
Creatinine, Ser: 1 mg/dL (ref 0.61–1.24)
GFR calc Af Amer: >60 mL/min (ref >60)
GFR calc non Af Amer: >60 mL/min (ref >60)
Glucose, Bld: 98 mg/dL (ref 70–99)
Potassium: 3.7 mmol/L (ref 3.5–5.1)
Sodium: 138 mmol/L (ref 135–145)
Total Bilirubin: 0.6 mg/dL (ref 0.3–1.2)
Total Protein: 7.6 g/dL (ref 6.5–8.1)

## 2019-09-01 LAB — CBC WITH DIFFERENTIAL/PLATELET
Abs Immature Granulocytes: 0.08 10*3/uL — ABNORMAL HIGH (ref 0.00–0.07)
Basophils Absolute: 0.1 10*3/uL (ref 0.0–0.1)
Basophils Relative: 1 %
Eosinophils Absolute: 0.2 10*3/uL (ref 0.0–0.5)
Eosinophils Relative: 2 %
HCT: 28.8 % — ABNORMAL LOW (ref 39.0–52.0)
Hemoglobin: 8.7 g/dL — ABNORMAL LOW (ref 13.0–17.0)
Immature Granulocytes: 1 %
Lymphocytes Relative: 17 %
Lymphs Abs: 1.6 10*3/uL (ref 0.7–4.0)
MCH: 27.2 pg (ref 26.0–34.0)
MCHC: 30.2 g/dL (ref 30.0–36.0)
MCV: 90 fL (ref 80.0–100.0)
Monocytes Absolute: 0.9 10*3/uL (ref 0.1–1.0)
Monocytes Relative: 10 %
Neutro Abs: 6.6 10*3/uL (ref 1.7–7.7)
Neutrophils Relative %: 69 %
Platelets: 506 10*3/uL — ABNORMAL HIGH (ref 150–400)
RBC: 3.2 MIL/uL — ABNORMAL LOW (ref 4.22–5.81)
RDW: 24.9 % — ABNORMAL HIGH (ref 11.5–15.5)
Smear Review: INCREASED
WBC: 9.6 10*3/uL (ref 4.0–10.5)
nRBC: 0.5 % — ABNORMAL HIGH (ref 0.0–0.2)

## 2019-09-01 LAB — SURGICAL PATHOLOGY

## 2019-09-01 MED ORDER — AZACITIDINE CHEMO SQ INJECTION
50.0000 mg/m2 | Freq: Once | INTRAMUSCULAR | Status: AC
  Administered 2019-09-01: 132.5 mg via SUBCUTANEOUS
  Filled 2019-09-01: qty 5.3

## 2019-09-01 MED ORDER — ONDANSETRON HCL 4 MG PO TABS
8.0000 mg | ORAL_TABLET | Freq: Once | ORAL | Status: AC
  Administered 2019-09-01: 8 mg via ORAL
  Filled 2019-09-01: qty 2

## 2019-09-01 NOTE — Progress Notes (Signed)
Hematology/Oncology Consult note [facility]  Telephone:([phone] [phone]  Patient Care Team: Lavera Guise, MD as PCP - General (Internal Medicine) Sindy Guadeloupe, MD as Consulting Physician (Oncology)   Name of the patient: Samuel Frank  [id number]  Nov 19, 1940   Date of visit: 09/01/19  Diagnosis-CMML 1  Chief complaint/ Reason for visit-on treatment assessment prior to next cycle of Vidaza  Heme/Onc history: Patient is a 79 yr old male with no significant co morbidities other than hypertension. He has recently been having bilateral knee pain and has undergone steroid injection in his knee.Patient was noted to have a high white count on his routine exam. His white count was 19.2 on 07/22/2018 with an H&H of 11.4/35.1 and a platelet count of 470. At that point he was prescribed a course of antibiotics and a repeat blood work on 08/21/2018 showed white count of 42.3, H&H of 7.1/23 with an MCV of 84 and a platelet count of 791 and hence the patient was referred to hematology for further management.  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 In addition the core biopsy showed a small focus of fibrosis containing scattering of mast cells and eosinophils most suggestive of systemic mastocytosis.bone marrow blasts 7%.Flow cytometry showed increased number of monocytic cells representing 16% of all cells with expression for HLA-DR, CD11b, CD13, CD14, CD33, CD38, CD56 and CD56 and CD64 with [facility] CD 117 are CD34.normal cytogenetics, CBL, SRSF2, SH2B3 and TET2  Repeat bone marrow biopsy after 4 cycles of Vidaza showed less pronounced monocytic/granulocytic proliferation and overall better granulopoiesis and no increase in blasts. In addition features of systemic mastocytosis not present.  After 9 cycles of Vidaza patient was noted to have significant anemia and worsening thrombocytosis for which  a repeat bone marrow biopsy was done. Bone marrow did not show any features of progression of CMML. Overall stable findings and no increased blast.He was found to have iron deficiency and received 2 doses of Feraheme.  Patient seen by [facility] GI and underwent EGD and colonoscopy for iron deficiency anemia.Colonoscopy showed nonbleeding internal hemorrhoids.  Normal mucosa in the colon.  No polyps EGD showed normal esophagus and gastric mucosal atrophy.  No stigmata of bleeding   Interval history-patient currently feels well and denies any shortness of breath at this time.  Has mild chronic low back pain and fatigue but denies other complaints  ECOG PS- 1 Pain scale- 0   Review of systems- Review of Systems  Constitutional: Positive for malaise/fatigue. Negative for chills, fever and weight loss.  HENT: Negative for congestion, ear discharge and nosebleeds.   Eyes: Negative for blurred vision.  Respiratory: Negative for cough, hemoptysis, sputum production, shortness of breath and wheezing.   Cardiovascular: Negative for chest pain, palpitations, orthopnea and claudication.  Gastrointestinal: Negative for abdominal pain, blood in stool, constipation, diarrhea, heartburn, melena, nausea and vomiting.  Genitourinary: Negative for dysuria, flank pain, frequency, hematuria and urgency.  Musculoskeletal: Positive for back pain. Negative for joint pain and myalgias.  Skin: Negative for rash.  Neurological: Negative for dizziness, tingling, focal weakness, seizures, weakness and headaches.  Endo/Heme/Allergies: Does not bruise/bleed easily.  Psychiatric/Behavioral: Negative for depression and suicidal ideas. The patient does not have insomnia.        No Known Allergies   Past Medical History:  Diagnosis Date  . Anemia   . CMML (chronic myelomonocytic leukemia) ([place])   . Dyspnea   . Hypertension  Past Surgical History:  Procedure Laterality Date  . COLONOSCOPY N/A 08/28/2019     Procedure: COLONOSCOPY;  Surgeon: Toledo, Benay Pike, MD;  Location: [facility] ENDOSCOPY;  Service: Gastroenterology;  Laterality: N/A;  . COLONOSCOPY WITH PROPOFOL N/A 05/03/2015   Procedure: COLONOSCOPY WITH PROPOFOL;  Surgeon: [name], MD;  Location: [facility] ENDOSCOPY;  Service: Gastroenterology;  Laterality: N/A;  . ESOPHAGOGASTRODUODENOSCOPY N/A 08/28/2019   Procedure: ESOPHAGOGASTRODUODENOSCOPY (EGD);  Surgeon: Toledo, Benay Pike, MD;  Location: [facility] ENDOSCOPY;  Service: Gastroenterology;  Laterality: N/A;  . ESOPHAGOGASTRODUODENOSCOPY (EGD) WITH PROPOFOL N/A 05/03/2015   Procedure: ESOPHAGOGASTRODUODENOSCOPY (EGD) WITH PROPOFOL;  Surgeon: [name], MD;  Location: [facility] ENDOSCOPY;  Service: Gastroenterology;  Laterality: N/A;  . HERNIA REPAIR     1975    Social History   Socioeconomic History  . Marital status: Divorced    Spouse name: Not on file  . Number of children: 4  . Years of education: Not on file  . Highest education level: Not on file  Occupational History  . Occupation: retired    Comment: [employer]  Tobacco Use  . Smoking status: Never Smoker  . Smokeless tobacco: Never Used  Vaping Use  . Vaping Use: Never used  Substance and Sexual Activity  . Alcohol use: No  . Drug use: No  . Sexual activity: Not on file  Other Topics Concern  . Not on file  Social History Narrative  . Not on file   Social Determinants of Health   Financial Resource Strain: Low Risk   . Difficulty of Paying Living Expenses: Not hard at all  Food Insecurity: No Food Insecurity  . Worried About Charity fundraiser in the Last Year: Never true  . Ran Out of Food in the Last Year: Never true  Transportation Needs: No Transportation Needs  . Lack of Transportation (Medical): No  . Lack of Transportation (Non-Medical): No  Physical Activity: Sufficiently Active  . Days of Exercise per Week: 3 days  . Minutes of Exercise per Session: 150+ min  Stress: No Stress Concern Present   . Feeling of Stress : Not at all  Social Connections: Moderately Isolated  . Frequency of Communication with Friends and Family: More than three times a week  . Frequency of Social Gatherings with Friends and Family: More than three times a week  . Attends Religious Services: More than 4 times per year  . Active Member of Clubs or Organizations: No  . Attends Archivist Meetings: Never  . Marital Status: Divorced  Human resources officer Violence:   . Fear of Current or Ex-Partner:   . Emotionally Abused:   [name] Physically Abused:   . Sexually Abused:     Family History  Problem Relation Age of Onset  . Alzheimer's disease Father   . Lung cancer Sister   . Cancer Sister      Current Outpatient Medications:  .  allopurinol (ZYLOPRIM) 300 MG tablet, TAKE 1 TABLET BY MOUTH EVERY DAY, Disp: 30 tablet, Rfl: 3 .  aspirin EC 81 MG tablet, Take 81 mg by mouth daily., Disp: , Rfl:  .  Cyanocobalamin (B-12 PO), Take 3,000 mcg by mouth daily., Disp: , Rfl:  .  cyanocobalamin 500 MCG tablet, Take 500 mcg by mouth daily., Disp: , Rfl:  .  cyclobenzaprine (FLEXERIL) 10 MG tablet, Take 1 tablet by mouth., Disp: , Rfl:  .  ibuprofen (ADVIL,MOTRIN) 200 MG tablet, Take 200 mg by mouth every 6 (six) hours  as needed. , Disp: , Rfl:  .  KLOR-CON M20 20 MEQ tablet, TAKE 1 TABLET BY MOUTH TWICE A DAY, Disp: 60 tablet, Rfl: 0 .  ondansetron (ZOFRAN) 8 MG tablet, Take 1 tablet (8 mg total) by mouth 2 (two) times daily as needed (Nausea or vomiting)., Disp: 30 tablet, Rfl: 1 .  pantoprazole (PROTONIX) 40 MG tablet, Take 1 tablet (40 mg total) by mouth daily., Disp: 90 tablet, Rfl: 1 .  prochlorperazine (COMPAZINE) 10 MG tablet, Take 1 tablet (10 mg total) by mouth every 6 (six) hours as needed (Nausea or vomiting)., Disp: 30 tablet, Rfl: 2 .  tiZANidine (ZANAFLEX) 4 MG tablet, Take 4 mg by mouth every 6 (six) hours as needed. , Disp: , Rfl:  .  valACYclovir (VALTREX) 500 MG tablet, Take 1 tablet (500 mg  total) by mouth daily., Disp: 90 tablet, Rfl: 1 .  valsartan-hydrochlorothiazide (DIOVAN-HCT) 320-25 MG tablet, Take 1/2 tablet po QD, Disp: 90 tablet, Rfl: 2 .  colchicine 0.6 MG tablet, Take 2 tablets (1.42m) by mouth at first sign of gout flare followed by 1 tablet (0.669m after 1 hour. (Max 1.79m59mithin 1 hour) (Patient not taking: Reported on 08/15/2019), Disp: , Rfl:   Physical exam:  Vitals:   09/01/19 1316  BP: 108/65  Pulse: 89  Resp: 18  Temp: (!) 97.3 F (36.3 C)  TempSrc: Tympanic  Weight: (!) 321 lb 6.4 oz (145.8 kg)   Physical Exam Constitutional:      General: He is not in acute distress. Cardiovascular:     Rate and Rhythm: Normal rate and regular rhythm.     Heart sounds: Normal heart sounds.  Pulmonary:     Effort: Pulmonary effort is normal.     Breath sounds: Normal breath sounds.  Abdominal:     General: Bowel sounds are normal.     Palpations: Abdomen is soft.  Musculoskeletal:     Cervical back: Normal range of motion.  Skin:    General: Skin is warm and dry.  Neurological:     Mental Status: He is alert and oriented to person, place, and time.      CMP Latest Ref Rng & Units 09/01/2019  Glucose 70 - 99 mg/dL 98  BUN 8 - 23 mg/dL 12  Creatinine 0.61 - 1.24 mg/dL 1.00  Sodium 135 - 145 mmol/L 138  Potassium 3.5 - 5.1 mmol/L 3.7  Chloride 98 - 111 mmol/L 105  CO2 22 - 32 mmol/L 25  Calcium 8.9 - 10.3 mg/dL 8.6(L)  Total Protein 6.5 - 8.1 g/dL 7.6  Total Bilirubin 0.3 - 1.2 mg/dL 0.6  Alkaline Phos 38 - 126 U/L 72  AST 15 - 41 U/L 11(L)  ALT 0 - 44 U/L 9   CBC Latest Ref Rng & Units 09/01/2019  WBC 4.0 - 10.5 K/uL 9.6  Hemoglobin 13.0 - 17.0 g/dL 8.7(L)  Hematocrit 39 - 52 % 28.8(L)  Platelets 150 - 400 K/uL 506(H)    No images are attached to the encounter.  CT ANGIO CHEST PE W OR WO CONTRAST  Result Date: 08/15/2019 CLINICAL DATA:  Dyspnea on exertion. Tachycardia. Recently diagnosed CMML. EXAM: CT ANGIOGRAPHY CHEST WITH CONTRAST  TECHNIQUE: Multidetector CT imaging of the chest was performed using the standard protocol during bolus administration of intravenous contrast. Multiplanar CT image reconstructions and MIPs were obtained to evaluate the vascular anatomy. CONTRAST:  72m33mNIPAQUE IOHEXOL 350 MG/ML SOLN COMPARISON:  05/06/2017 chest radiograph. FINDINGS: Cardiovascular: The study is high quality  for the evaluation of pulmonary embolism. There are no filling defects in the central, lobar, segmental or subsegmental pulmonary artery branches to suggest acute pulmonary embolism. Atherosclerotic nonaneurysmal thoracic aorta. Normal caliber main pulmonary artery. Normal heart size. No significant pericardial fluid/thickening. Left anterior descending and left circumflex coronary atherosclerosis. Mediastinum/Nodes: No discrete thyroid nodules. Unremarkable esophagus. No pathologically enlarged axillary, mediastinal or hilar lymph nodes. Lungs/Pleura: No pneumothorax. No pleural effusion. No acute consolidative airspace disease, lung masses or significant pulmonary nodules. Mild ground-glass opacity and reticulation in dependent lower lungs, nonspecific, favor hypoventilatory change. Mild diffuse bronchial wall thickening. Upper abdomen: Small hiatal hernia. Musculoskeletal: No aggressive appearing focal osseous lesions. Marked thoracic spondylosis. Review of the MIP images confirms the above findings. IMPRESSION: 1. No pulmonary embolism. 2. Nonspecific mild diffuse bronchial wall thickening, as can be seen with chronic bronchitis or reactive airways disease. 3. Two vessel coronary atherosclerosis. 4. Small hiatal hernia. 5. Aortic Atherosclerosis (ICD10-I70.0). These results will be called to the ordering clinician or representative by the Radiology Department at the imaging location. Electronically Signed   By: Ilona Sorrel M.D.   On: 08/15/2019 10:45   ECHOCARDIOGRAM COMPLETE  Result Date: 08/19/2019    ECHOCARDIOGRAM REPORT    Patient Name:   Samuel Frank. Date of Exam: 08/19/2019 Medical Rec #:  [id number]          Height:       74.5 in Accession #:    [id number]         Weight:       316.5 lb Date of Birth:  [birth year]           BSA:          2.654 m Patient Age:    1 years           BP:           87/49 mmHg Patient Gender: M                  HR:           97 bpm. Exam Location:  [facility] Procedure: 2D Echo, Cardiac Doppler and Color Doppler Indications:     Chemo V67.2  History:         Patient has no prior history of Echocardiogram examinations.                  Risk Factors:Hypertension.  Sonographer:     Sherrie Sport RDCS (AE) Referring Phys:  [id number] Weston Anna Collie Wernick Diagnosing Phys: Harrell Gave End MD IMPRESSIONS  1. Left ventricular ejection fraction, by estimation, is 65 to 70%. The left ventricle has normal function. Left ventricular endocardial border not optimally defined to evaluate regional wall motion. There is mild left ventricular hypertrophy of the basal-septal segment. Left ventricular diastolic parameters are consistent with age-related delayed relaxation (normal).  2. Right ventricular systolic function is normal. The right ventricular size is mildly enlarged.  3. Right atrial size was mildly dilated.  4. The mitral valve is degenerative. No evidence of mitral valve regurgitation. No evidence of mitral stenosis.  5. The aortic valve is tricuspid. Aortic valve regurgitation is not visualized. Mild aortic valve sclerosis is present, with no evidence of aortic valve stenosis. FINDINGS  Left Ventricle: Left ventricular ejection fraction, by estimation, is 65 to 70%. The left ventricle has normal function. Left ventricular endocardial border not optimally defined to evaluate regional wall motion. The left ventricular internal cavity size was normal in size. There is mild  left ventricular hypertrophy of the basal-septal segment. Left ventricular diastolic parameters are consistent with age-related delayed relaxation (normal).  Right Ventricle: Pulmonary artery pressure is mildly to moderately elevated (35-40 mmHg plus central venous pressure). The right ventricular size is mildly enlarged. No increase in right ventricular wall thickness. Right ventricular systolic function is normal. Left Atrium: Left atrial size was normal in size. Right Atrium: Right atrial size was mildly dilated. Pericardium: The pericardium was not well visualized. Mitral Valve: The mitral valve is degenerative in appearance. There is mild thickening of the mitral valve leaflet(s). No evidence of mitral valve regurgitation. No evidence of mitral valve stenosis. Tricuspid Valve: The tricuspid valve is grossly normal. Tricuspid valve regurgitation is trivial. Aortic Valve: The aortic valve is tricuspid. . There is mild thickening and mild calcification of the aortic valve. Aortic valve regurgitation is not visualized. Mild aortic valve sclerosis is present, with no evidence of aortic valve stenosis. There is mild thickening of the aortic valve. There is mild calcification of the aortic valve. Aortic valve mean gradient measures 2.5 mmHg. Aortic valve peak gradient measures 4.1 mmHg. Aortic valve area, by VTI measures 3.22 cm. Pulmonic Valve: The pulmonic valve was not well visualized. Pulmonic valve regurgitation is not visualized. No evidence of pulmonic stenosis. Aorta: The aortic root is normal in size and structure. Pulmonary Artery: The pulmonary artery is not well seen. Venous: The inferior vena cava was not well visualized. IAS/Shunts: The interatrial septum was not well visualized.  LEFT VENTRICLE PLAX 2D LVIDd:         3.85 cm  Diastology LVIDs:         2.36 cm  LV e' lateral:   8.59 cm/s LV PW:         1.14 cm  LV E/e' lateral: 7.0 LV IVS:        1.36 cm  LV e' medial:    8.38 cm/s LVOT diam:     2.10 cm  LV E/e' medial:  7.2 LV SV:         53 LV SV Index:   20 LVOT Area:     3.46 cm  RIGHT VENTRICLE RV Basal diam:  4.29 cm RV S prime:     15.60 cm/s TAPSE  (M-mode): 2.7 cm LEFT ATRIUM             Index       RIGHT ATRIUM           Index LA diam:        3.40 cm 1.28 cm/m  RA Area:     22.50 cm LA Vol (A2C):   60.3 ml 22.72 ml/m RA Volume:   68.20 ml  25.70 ml/m LA Vol (A4C):   50.5 ml 19.03 ml/m LA Biplane Vol: 59.6 ml 22.46 ml/m  AORTIC VALVE                   PULMONIC VALVE AV Area (Vmax):    3.11 cm    PV Vmax:        0.95 m/s AV Area (Vmean):   3.18 cm    PV Peak grad:   3.6 mmHg AV Area (VTI):     3.22 cm    RVOT Peak grad: 9 mmHg AV Vmax:           101.85 cm/s AV Vmean:          73.700 cm/s AV VTI:            0.164  m AV Peak Grad:      4.1 mmHg AV Mean Grad:      2.5 mmHg LVOT Vmax:         91.40 cm/s LVOT Vmean:        67.700 cm/s LVOT VTI:          0.152 m LVOT/AV VTI ratio: 0.93  AORTA Ao Root diam: 3.40 cm MITRAL VALVE                TRICUSPID VALVE MV Area (PHT): 5.27 cm     TR Peak grad:   37.9 mmHg MV Decel Time: 144 msec     TR Vmax:        308.00 cm/s MV E velocity: 60.40 cm/s MV A velocity: 107.00 cm/s  SHUNTS MV E/A ratio:  0.56         Systemic VTI:  0.15 m                             Systemic Diam: 2.10 cm Nelva Bush MD Electronically signed by Nelva Bush MD Signature Date/Time: 08/19/2019/2:26:26 PM    Final      Assessment and plan- Patient is a 79 y.o. male with history of CMML 1 who is currently getting Vidaza 5 days every month.  He is here for on treatment assessment prior to next cycle of Vidaza  Iron deficiency anemia: Patient received 2 doses of Feraheme 2 weeks ago.  Hemoglobin mildly improved from 7.8-8.7.  He also underwent GI work-up including EGD and colonoscopy which did not show any bleeding.  He will be undergoing capsule endoscopy soon.  CMML: Counts okay to proceed with next cycle of Vidaza today and day 1 to day 5.  I will see him back in 4 weeks with CBC with differential, CMP for cycle 13.  Shortness of breath: Etiology unclear possibly secondary to anemia.  Echocardiogram was normal and CTA did not  reveal any pulmonary embolism.  Continue to monitor   Visit Diagnosis 1. Encounter for antineoplastic chemotherapy   2. Iron deficiency anemia, unspecified iron deficiency anemia type   3. 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 Randa Evens, MD, MPH [facility] [place] [phone] 09/01/2019 2:25 PM

## 2019-09-01 NOTE — Progress Notes (Signed)
Patient denies any concerns today.  

## 2019-09-02 ENCOUNTER — Ambulatory Visit: Payer: Medicare Other | Admitting: Oncology

## 2019-09-02 ENCOUNTER — Ambulatory Visit: Payer: Medicare Other

## 2019-09-02 ENCOUNTER — Inpatient Hospital Stay: Payer: Medicare Other

## 2019-09-02 ENCOUNTER — Other Ambulatory Visit: Payer: Medicare Other

## 2019-09-02 VITALS — BP 122/66 | HR 102 | Temp 97.7°F | Resp 16

## 2019-09-02 DIAGNOSIS — I471 Supraventricular tachycardia: Secondary | ICD-10-CM | POA: Diagnosis not present

## 2019-09-02 DIAGNOSIS — Z809 Family history of malignant neoplasm, unspecified: Secondary | ICD-10-CM | POA: Diagnosis not present

## 2019-09-02 DIAGNOSIS — C9310 Chronic myelomonocytic leukemia not having achieved remission: Secondary | ICD-10-CM

## 2019-09-02 DIAGNOSIS — Z5111 Encounter for antineoplastic chemotherapy: Secondary | ICD-10-CM | POA: Diagnosis not present

## 2019-09-02 DIAGNOSIS — Z79899 Other long term (current) drug therapy: Secondary | ICD-10-CM | POA: Diagnosis not present

## 2019-09-02 DIAGNOSIS — R0602 Shortness of breath: Secondary | ICD-10-CM | POA: Diagnosis not present

## 2019-09-02 DIAGNOSIS — Z801 Family history of malignant neoplasm of trachea, bronchus and lung: Secondary | ICD-10-CM | POA: Diagnosis not present

## 2019-09-02 DIAGNOSIS — D649 Anemia, unspecified: Secondary | ICD-10-CM | POA: Diagnosis not present

## 2019-09-02 MED ORDER — ONDANSETRON HCL 4 MG PO TABS
8.0000 mg | ORAL_TABLET | Freq: Once | ORAL | Status: AC
  Administered 2019-09-02: 8 mg via ORAL
  Filled 2019-09-02: qty 2

## 2019-09-02 MED ORDER — AZACITIDINE CHEMO SQ INJECTION
50.0000 mg/m2 | Freq: Once | INTRAMUSCULAR | Status: AC
  Administered 2019-09-02: 132.5 mg via SUBCUTANEOUS
  Filled 2019-09-02: qty 5.3

## 2019-09-03 ENCOUNTER — Inpatient Hospital Stay: Payer: Medicare Other

## 2019-09-03 ENCOUNTER — Other Ambulatory Visit: Payer: Self-pay

## 2019-09-03 VITALS — BP 108/57 | HR 96 | Temp 97.8°F | Resp 20

## 2019-09-03 DIAGNOSIS — C9310 Chronic myelomonocytic leukemia not having achieved remission: Secondary | ICD-10-CM | POA: Diagnosis not present

## 2019-09-03 DIAGNOSIS — Z5111 Encounter for antineoplastic chemotherapy: Secondary | ICD-10-CM | POA: Diagnosis not present

## 2019-09-03 DIAGNOSIS — Z79899 Other long term (current) drug therapy: Secondary | ICD-10-CM | POA: Diagnosis not present

## 2019-09-03 DIAGNOSIS — Z809 Family history of malignant neoplasm, unspecified: Secondary | ICD-10-CM | POA: Diagnosis not present

## 2019-09-03 DIAGNOSIS — I471 Supraventricular tachycardia: Secondary | ICD-10-CM | POA: Diagnosis not present

## 2019-09-03 DIAGNOSIS — R0602 Shortness of breath: Secondary | ICD-10-CM | POA: Diagnosis not present

## 2019-09-03 DIAGNOSIS — D649 Anemia, unspecified: Secondary | ICD-10-CM | POA: Diagnosis not present

## 2019-09-03 DIAGNOSIS — Z801 Family history of malignant neoplasm of trachea, bronchus and lung: Secondary | ICD-10-CM | POA: Diagnosis not present

## 2019-09-03 MED ORDER — AZACITIDINE CHEMO SQ INJECTION
50.0000 mg/m2 | Freq: Once | INTRAMUSCULAR | Status: AC
  Administered 2019-09-03: 132.5 mg via SUBCUTANEOUS
  Filled 2019-09-03: qty 5.3

## 2019-09-03 MED ORDER — ONDANSETRON HCL 4 MG PO TABS
8.0000 mg | ORAL_TABLET | Freq: Once | ORAL | Status: AC
  Administered 2019-09-03: 8 mg via ORAL
  Filled 2019-09-03: qty 2

## 2019-09-04 ENCOUNTER — Other Ambulatory Visit: Payer: Self-pay

## 2019-09-04 ENCOUNTER — Inpatient Hospital Stay: Payer: Medicare Other | Attending: Oncology

## 2019-09-04 VITALS — BP 107/55 | HR 96 | Temp 98.2°F | Resp 16

## 2019-09-04 DIAGNOSIS — C9310 Chronic myelomonocytic leukemia not having achieved remission: Secondary | ICD-10-CM | POA: Diagnosis not present

## 2019-09-04 DIAGNOSIS — Z5111 Encounter for antineoplastic chemotherapy: Secondary | ICD-10-CM | POA: Diagnosis not present

## 2019-09-04 MED ORDER — ONDANSETRON HCL 4 MG PO TABS
8.0000 mg | ORAL_TABLET | Freq: Once | ORAL | Status: AC
  Administered 2019-09-04: 8 mg via ORAL
  Filled 2019-09-04: qty 2

## 2019-09-04 MED ORDER — AZACITIDINE CHEMO SQ INJECTION
50.0000 mg/m2 | Freq: Once | INTRAMUSCULAR | Status: AC
  Administered 2019-09-04: 132.5 mg via SUBCUTANEOUS
  Filled 2019-09-04: qty 5.3

## 2019-09-05 ENCOUNTER — Inpatient Hospital Stay: Payer: Medicare Other

## 2019-09-05 VITALS — BP 98/58 | HR 94 | Resp 16

## 2019-09-05 DIAGNOSIS — C9310 Chronic myelomonocytic leukemia not having achieved remission: Secondary | ICD-10-CM

## 2019-09-05 DIAGNOSIS — Z5111 Encounter for antineoplastic chemotherapy: Secondary | ICD-10-CM | POA: Diagnosis not present

## 2019-09-05 MED ORDER — ONDANSETRON HCL 4 MG PO TABS
8.0000 mg | ORAL_TABLET | Freq: Once | ORAL | Status: AC
  Administered 2019-09-05: 8 mg via ORAL
  Filled 2019-09-05: qty 2

## 2019-09-05 MED ORDER — AZACITIDINE CHEMO SQ INJECTION
50.0000 mg/m2 | Freq: Once | INTRAMUSCULAR | Status: AC
  Administered 2019-09-05: 132.5 mg via SUBCUTANEOUS
  Filled 2019-09-05: qty 5.3

## 2019-09-28 NOTE — Progress Notes (Signed)
Hematology/Oncology Consult note [facility]  Telephone:([phone] Fax:([phone]  Patient Care Team: Lavera Guise, MD as PCP - General (Internal Medicine) Sindy Guadeloupe, MD as Consulting Physician (Oncology)   Name of the patient: Samuel Frank  [id number]  1941/02/20   Date of visit: 09/28/19  Diagnosis- CMML-1   Chief complaint/ Reason for visit-on treatment assessment prior to next cycle of Vidaza  Heme/Onc history: Patient is a 79 yr old male with no significant co morbidities other than hypertension. He has recently been having bilateral knee pain and has undergone steroid injection in his knee.Patient was noted to have a high white count on his routine exam. His white count was 19.2 on 07/22/2018 with an H&H of 11.4/35.1 and a platelet count of 470. At that point he was prescribed a course of antibiotics and a repeat blood work on 08/21/2018 showed white count of 42.3, H&H of 7.1/23 with an MCV of 84 and a platelet count of 791 and hence the patient was referred to hematology for further management.  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 In addition the core biopsy showed a small focus of fibrosis containing scattering of mast cells and eosinophils most suggestive of systemic mastocytosis.bone marrow blasts 7%.Flow cytometry showed increased number of monocytic cells representing 16% of all cells with expression for HLA-DR, CD11b, CD13, CD14, CD33, CD38, CD56 and CD56 and CD64 with [facility] CD 117 are CD34.normal cytogenetics, CBL, SRSF2, SH2B3 and TET2  Repeat bone marrow biopsy after 4 cycles of Vidaza showed less pronounced monocytic/granulocytic proliferation and overall better granulopoiesis and no increase in blasts. In addition features of systemic mastocytosis not present.  After 9 cycles of Vidaza patient was noted to have significant anemia and worsening thrombocytosis for  which a repeat bone marrow biopsy was done. Bone marrow did not show any features of progression of CMML. Overall stable findings and no increased blast.He was found to have iron deficiency and received 2 doses of Feraheme.  Patient seen by [facility] and underwent EGD and colonoscopy for iron deficiency anemia.Colonoscopy showed nonbleeding internal hemorrhoids.  Normal mucosa in the colon.  No polyps EGD showed normal esophagus and gastric mucosal atrophy.  No stigmata of bleeding   Interval history-patient reports doing well and denies any complaints at this time.  Denies any exertional shortness of breath or leg swelling.  Appetite and weight has remained stable.  ECOG PS- 1 Pain scale- 0   Review of systems- Review of Systems  Constitutional: Negative for chills, fever, malaise/fatigue and weight loss.  HENT: Negative for congestion, ear discharge and nosebleeds.   Eyes: Negative for blurred vision.  Respiratory: Negative for cough, hemoptysis, sputum production, shortness of breath and wheezing.   Cardiovascular: Negative for chest pain, palpitations, orthopnea and claudication.  Gastrointestinal: Negative for abdominal pain, blood in stool, constipation, diarrhea, heartburn, melena, nausea and vomiting.  Genitourinary: Negative for dysuria, flank pain, frequency, hematuria and urgency.  Musculoskeletal: Negative for back pain, joint pain and myalgias.  Skin: Negative for rash.  Neurological: Negative for dizziness, tingling, focal weakness, seizures, weakness and headaches.  Endo/Heme/Allergies: Does not bruise/bleed easily.  Psychiatric/Behavioral: Negative for depression and suicidal ideas. The patient does not have insomnia.       No Known Allergies   Past Medical History:  Diagnosis Date  . Anemia   . CMML (chronic myelomonocytic leukemia) ([place])   . Dyspnea   . Hypertension  Past Surgical History:  Procedure Laterality Date  . COLONOSCOPY N/A 08/28/2019    Procedure: COLONOSCOPY;  Surgeon: Toledo, Benay Pike, MD;  Location: [facility] ENDOSCOPY;  Service: Gastroenterology;  Laterality: N/A;  . COLONOSCOPY WITH PROPOFOL N/A 05/03/2015   Procedure: COLONOSCOPY WITH PROPOFOL;  Surgeon: [name], MD;  Location[facility] ENDOSCOPY;  Service: Gastroenterology;  Laterality: N/A;  . ESOPHAGOGASTRODUODENOSCOPY N/A 08/28/2019   Procedure: ESOPHAGOGASTRODUODENOSCOPY (EGD);  Surgeon: Toledo, Benay Pike, MD;  Location: [facility] ENDOSCOPY;  Service: Gastroenterology;  Laterality: N/A;  . ESOPHAGOGASTRODUODENOSCOPY (EGD) WITH PROPOFOL N/A 05/03/2015   Procedure: ESOPHAGOGASTRODUODENOSCOPY (EGD) WITH PROPOFOL;  Surgeon: [name], MD;  Location: [facility] ENDOSCOPY;  Service: Gastroenterology;  Laterality: N/A;  . HERNIA REPAIR     1975    Social History   Socioeconomic History  . Marital status: Divorced    Spouse name: Not on file  . Number of children: 4  . Years of education: Not on file  . Highest education level: Not on file  Occupational History  . Occupation: retired    Comment: [employer]  Tobacco Use  . Smoking status: Never Smoker  . Smokeless tobacco: Never Used  Vaping Use  . Vaping Use: Never used  Substance and Sexual Activity  . Alcohol use: No  . Drug use: No  . Sexual activity: Not on file  Other Topics Concern  . Not on file  Social History Narrative  . Not on file   Social Determinants of Health   Financial Resource Strain:   . Difficulty of Paying Living Expenses:   Food Insecurity:   . Worried About Charity fundraiser in the Last Year:   . Arboriculturist in the Last Year:   Transportation Needs:   . Film/video editor (Medical):   [name] Lack of Transportation (Non-Medical):   Physical Activity:   . Days of Exercise per Week:   . Minutes of Exercise per Session:   Stress:   . Feeling of Stress :   Social Connections:   . Frequency of Communication with Friends and Family:   . Frequency of Social Gatherings with  Friends and Family:   . Attends Religious Services:   . Active Member of Clubs or Organizations:   . Attends Archivist Meetings:   [name] Marital Status:   Intimate Partner Violence:   . Fear of Current or Ex-Partner:   . Emotionally Abused:   [name] Physically Abused:   . Sexually Abused:     Family History  Problem Relation Age of Onset  . Alzheimer's disease Father   . Lung cancer Sister   . Cancer Sister      Current Outpatient Medications:  .  allopurinol (ZYLOPRIM) 300 MG tablet, TAKE 1 TABLET BY MOUTH EVERY DAY, Disp: 30 tablet, Rfl: 3 .  aspirin EC 81 MG tablet, Take 81 mg by mouth daily., Disp: , Rfl:  .  colchicine 0.6 MG tablet, Take 2 tablets (1.7m) by mouth at first sign of gout flare followed by 1 tablet (0.662m after 1 hour. (Max 1.63m66mithin 1 hour) (Patient not taking: Reported on 08/15/2019), Disp: , Rfl:  .  Cyanocobalamin (B-12 PO), Take 3,000 mcg by mouth daily., Disp: , Rfl:  .  cyanocobalamin 500 MCG tablet, Take 500 mcg by mouth daily., Disp: , Rfl:  .  cyclobenzaprine (FLEXERIL) 10 MG tablet, Take 1 tablet by mouth., Disp: , Rfl:  .  ibuprofen (ADVIL,MOTRIN) 200 MG tablet, Take 200 mg by  mouth every 6 (six) hours as needed. , Disp: , Rfl:  .  KLOR-CON M20 20 MEQ tablet, TAKE 1 TABLET BY MOUTH TWICE A DAY, Disp: 60 tablet, Rfl: 0 .  ondansetron (ZOFRAN) 8 MG tablet, Take 1 tablet (8 mg total) by mouth 2 (two) times daily as needed (Nausea or vomiting)., Disp: 30 tablet, Rfl: 1 .  pantoprazole (PROTONIX) 40 MG tablet, Take 1 tablet (40 mg total) by mouth daily., Disp: 90 tablet, Rfl: 1 .  prochlorperazine (COMPAZINE) 10 MG tablet, Take 1 tablet (10 mg total) by mouth every 6 (six) hours as needed (Nausea or vomiting)., Disp: 30 tablet, Rfl: 2 .  tiZANidine (ZANAFLEX) 4 MG tablet, Take 4 mg by mouth every 6 (six) hours as needed. , Disp: , Rfl:  .  valACYclovir (VALTREX) 500 MG tablet, Take 1 tablet (500 mg total) by mouth daily., Disp: 90 tablet, Rfl: 1 .   valsartan-hydrochlorothiazide (DIOVAN-HCT) 320-25 MG tablet, Take 1/2 tablet po QD, Disp: 90 tablet, Rfl: 2  Physical exam: There were no vitals filed for this visit. Physical Exam Constitutional:      General: He is not in acute distress. Cardiovascular:     Rate and Rhythm: Normal rate and regular rhythm.     Heart sounds: Normal heart sounds.  Pulmonary:     Effort: Pulmonary effort is normal.     Breath sounds: Normal breath sounds.  Abdominal:     General: Bowel sounds are normal.     Palpations: Abdomen is soft.  Skin:    General: Skin is warm and dry.  Neurological:     Mental Status: He is alert and oriented to person, place, and time.      CMP Latest Ref Rng & Units 09/29/2019  Glucose 70 - 99 mg/dL 105(H)  BUN 8 - 23 mg/dL 13  Creatinine 0.61 - 1.24 mg/dL 1.08  Sodium 135 - 145 mmol/L 136  Potassium 3.5 - 5.1 mmol/L 4.1  Chloride 98 - 111 mmol/L 105  CO2 22 - 32 mmol/L 25  Calcium 8.9 - 10.3 mg/dL 8.8(L)  Total Protein 6.5 - 8.1 g/dL 7.6  Total Bilirubin 0.3 - 1.2 mg/dL 0.6  Alkaline Phos 38 - 126 U/L 73  AST 15 - 41 U/L 14(L)  ALT 0 - 44 U/L 11   CBC Latest Ref Rng & Units 09/29/2019  WBC 4.0 - 10.5 K/uL 7.5  Hemoglobin 13.0 - 17.0 g/dL 8.5(L)  Hematocrit 39 - 52 % 28.3(L)  Platelets 150 - 400 K/uL 551(H)     Assessment and plan- Patient is a 79 y.o. male history of CMML 1 who is currently getting Vidaza 5 days every month.    He is here for on treatment assessment prior to next cycle of Vidaza  Counts okay to proceed with next cycle of Vidaza today and he will get it for the next 4 days.  He will continue to get Vidaza until progression or toxicity.  I will see him back in 4 weeks for cycle 14.  Anemia: Likely secondary to CMML iron deficiency.  Overall stable continue to monitor.  He did have recent endoscopy and colonoscopy on 08/28/2019 which did not reveal any active bleeding.  Repeat iron studies with next cycle  Thrombocytosis: Chronic possibly  secondary to underlying CMML.  Continue to monitor   Visit Diagnosis 1. Encounter for antineoplastic chemotherapy   2. 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 Thrombocytosis ([place])   4. Iron deficiency anemia,  unspecified iron deficiency anemia type   5. Normocytic anemia      Dr. Randa Evens, MD, MPH [facility] at [facility] [phone] 09/29/2019 9:25 AM

## 2019-09-29 ENCOUNTER — Other Ambulatory Visit: Payer: Self-pay

## 2019-09-29 ENCOUNTER — Inpatient Hospital Stay: Payer: Medicare Other

## 2019-09-29 ENCOUNTER — Encounter: Payer: Self-pay | Admitting: Oncology

## 2019-09-29 ENCOUNTER — Inpatient Hospital Stay (HOSPITAL_BASED_OUTPATIENT_CLINIC_OR_DEPARTMENT_OTHER): Payer: Medicare Other | Admitting: Oncology

## 2019-09-29 VITALS — BP 125/64 | HR 91 | Temp 96.3°F | Resp 16 | Wt 317.3 lb

## 2019-09-29 DIAGNOSIS — D509 Iron deficiency anemia, unspecified: Secondary | ICD-10-CM | POA: Diagnosis not present

## 2019-09-29 DIAGNOSIS — D473 Essential (hemorrhagic) thrombocythemia: Secondary | ICD-10-CM

## 2019-09-29 DIAGNOSIS — Z5111 Encounter for antineoplastic chemotherapy: Secondary | ICD-10-CM | POA: Diagnosis not present

## 2019-09-29 DIAGNOSIS — C9310 Chronic myelomonocytic leukemia not having achieved remission: Secondary | ICD-10-CM

## 2019-09-29 DIAGNOSIS — D649 Anemia, unspecified: Secondary | ICD-10-CM | POA: Diagnosis not present

## 2019-09-29 DIAGNOSIS — D75839 Thrombocytosis, unspecified: Secondary | ICD-10-CM

## 2019-09-29 LAB — CBC WITH DIFFERENTIAL/PLATELET
Abs Immature Granulocytes: 0.05 10*3/uL (ref 0.00–0.07)
Basophils Absolute: 0.1 10*3/uL (ref 0.0–0.1)
Basophils Relative: 2 %
Eosinophils Absolute: 0.1 10*3/uL (ref 0.0–0.5)
Eosinophils Relative: 2 %
HCT: 28.3 % — ABNORMAL LOW (ref 39.0–52.0)
Hemoglobin: 8.5 g/dL — ABNORMAL LOW (ref 13.0–17.0)
Immature Granulocytes: 1 %
Lymphocytes Relative: 20 %
Lymphs Abs: 1.5 10*3/uL (ref 0.7–4.0)
MCH: 24.6 pg — ABNORMAL LOW (ref 26.0–34.0)
MCHC: 30 g/dL (ref 30.0–36.0)
MCV: 81.8 fL (ref 80.0–100.0)
Monocytes Absolute: 0.4 10*3/uL (ref 0.1–1.0)
Monocytes Relative: 6 %
Neutro Abs: 5.3 10*3/uL (ref 1.7–7.7)
Neutrophils Relative %: 69 %
Platelets: 551 10*3/uL — ABNORMAL HIGH (ref 150–400)
RBC: 3.46 MIL/uL — ABNORMAL LOW (ref 4.22–5.81)
RDW: 21.2 % — ABNORMAL HIGH (ref 11.5–15.5)
Smear Review: INCREASED
WBC Morphology: ABNORMAL
WBC: 7.5 10*3/uL (ref 4.0–10.5)
nRBC: 0 % (ref 0.0–0.2)

## 2019-09-29 LAB — COMPREHENSIVE METABOLIC PANEL
ALT: 11 U/L (ref 0–44)
AST: 14 U/L — ABNORMAL LOW (ref 15–41)
Albumin: 3.9 g/dL (ref 3.5–5.0)
Alkaline Phosphatase: 73 U/L (ref 38–126)
Anion gap: 6 (ref 5–15)
BUN: 13 mg/dL (ref 8–23)
CO2: 25 mmol/L (ref 22–32)
Calcium: 8.8 mg/dL — ABNORMAL LOW (ref 8.9–10.3)
Chloride: 105 mmol/L (ref 98–111)
Creatinine, Ser: 1.08 mg/dL (ref 0.61–1.24)
GFR calc Af Amer: >60 mL/min (ref >60)
GFR calc non Af Amer: >60 mL/min (ref >60)
Glucose, Bld: 105 mg/dL — ABNORMAL HIGH (ref 70–99)
Potassium: 4.1 mmol/L (ref 3.5–5.1)
Sodium: 136 mmol/L (ref 135–145)
Total Bilirubin: 0.6 mg/dL (ref 0.3–1.2)
Total Protein: 7.6 g/dL (ref 6.5–8.1)

## 2019-09-29 MED ORDER — AZACITIDINE CHEMO SQ INJECTION
50.0000 mg/m2 | Freq: Once | INTRAMUSCULAR | Status: AC
  Administered 2019-09-29: 132.5 mg via SUBCUTANEOUS
  Filled 2019-09-29: qty 5.3

## 2019-09-29 MED ORDER — ONDANSETRON HCL 4 MG PO TABS
8.0000 mg | ORAL_TABLET | Freq: Once | ORAL | Status: AC
  Administered 2019-09-29: 8 mg via ORAL
  Filled 2019-09-29: qty 2

## 2019-09-30 ENCOUNTER — Other Ambulatory Visit: Payer: Self-pay | Admitting: Oncology

## 2019-09-30 ENCOUNTER — Inpatient Hospital Stay: Payer: Medicare Other

## 2019-09-30 VITALS — BP 112/60 | HR 100 | Temp 98.0°F | Resp 20

## 2019-09-30 DIAGNOSIS — C9310 Chronic myelomonocytic leukemia not having achieved remission: Secondary | ICD-10-CM

## 2019-09-30 DIAGNOSIS — Z5111 Encounter for antineoplastic chemotherapy: Secondary | ICD-10-CM | POA: Diagnosis not present

## 2019-09-30 MED ORDER — ONDANSETRON HCL 4 MG PO TABS
8.0000 mg | ORAL_TABLET | Freq: Once | ORAL | Status: AC
  Administered 2019-09-30: 8 mg via ORAL
  Filled 2019-09-30: qty 2

## 2019-09-30 MED ORDER — AZACITIDINE CHEMO SQ INJECTION
50.0000 mg/m2 | Freq: Once | INTRAMUSCULAR | Status: AC
  Administered 2019-09-30: 132.5 mg via SUBCUTANEOUS
  Filled 2019-09-30: qty 5.3

## 2019-10-01 ENCOUNTER — Inpatient Hospital Stay: Payer: Medicare Other

## 2019-10-01 ENCOUNTER — Other Ambulatory Visit: Payer: Self-pay

## 2019-10-01 VITALS — BP 110/62 | HR 97 | Temp 97.1°F | Resp 16

## 2019-10-01 DIAGNOSIS — C9310 Chronic myelomonocytic leukemia not having achieved remission: Secondary | ICD-10-CM

## 2019-10-01 DIAGNOSIS — Z5111 Encounter for antineoplastic chemotherapy: Secondary | ICD-10-CM | POA: Diagnosis not present

## 2019-10-01 MED ORDER — ONDANSETRON HCL 4 MG PO TABS
8.0000 mg | ORAL_TABLET | Freq: Once | ORAL | Status: AC
  Administered 2019-10-01: 8 mg via ORAL
  Filled 2019-10-01: qty 2

## 2019-10-01 MED ORDER — AZACITIDINE CHEMO SQ INJECTION
50.0000 mg/m2 | Freq: Once | INTRAMUSCULAR | Status: AC
  Administered 2019-10-01: 132.5 mg via SUBCUTANEOUS
  Filled 2019-10-01: qty 5.3

## 2019-10-02 ENCOUNTER — Inpatient Hospital Stay: Payer: Medicare Other

## 2019-10-02 VITALS — BP 123/78 | HR 96 | Temp 98.6°F | Resp 16

## 2019-10-02 DIAGNOSIS — Z5111 Encounter for antineoplastic chemotherapy: Secondary | ICD-10-CM | POA: Diagnosis not present

## 2019-10-02 DIAGNOSIS — C9310 Chronic myelomonocytic leukemia not having achieved remission: Secondary | ICD-10-CM

## 2019-10-02 MED ORDER — ONDANSETRON HCL 4 MG PO TABS
8.0000 mg | ORAL_TABLET | Freq: Once | ORAL | Status: AC
  Administered 2019-10-02: 8 mg via ORAL
  Filled 2019-10-02: qty 2

## 2019-10-02 MED ORDER — AZACITIDINE CHEMO SQ INJECTION
50.0000 mg/m2 | Freq: Once | INTRAMUSCULAR | Status: AC
  Administered 2019-10-02: 132.5 mg via SUBCUTANEOUS
  Filled 2019-10-02: qty 5.3

## 2019-10-03 ENCOUNTER — Inpatient Hospital Stay: Payer: Medicare Other

## 2019-10-03 ENCOUNTER — Other Ambulatory Visit: Payer: Self-pay

## 2019-10-03 VITALS — BP 94/58 | HR 100 | Temp 96.3°F | Resp 16

## 2019-10-03 DIAGNOSIS — C9310 Chronic myelomonocytic leukemia not having achieved remission: Secondary | ICD-10-CM | POA: Diagnosis not present

## 2019-10-03 DIAGNOSIS — Z5111 Encounter for antineoplastic chemotherapy: Secondary | ICD-10-CM | POA: Diagnosis not present

## 2019-10-03 MED ORDER — ONDANSETRON HCL 4 MG PO TABS
8.0000 mg | ORAL_TABLET | Freq: Once | ORAL | Status: AC
  Administered 2019-10-03: 8 mg via ORAL
  Filled 2019-10-03: qty 2

## 2019-10-03 MED ORDER — AZACITIDINE CHEMO SQ INJECTION
50.0000 mg/m2 | Freq: Once | INTRAMUSCULAR | Status: AC
  Administered 2019-10-03: 132.5 mg via SUBCUTANEOUS
  Filled 2019-10-03: qty 5.3

## 2019-10-20 ENCOUNTER — Other Ambulatory Visit: Payer: Self-pay | Admitting: Oncology

## 2019-10-26 ENCOUNTER — Other Ambulatory Visit: Payer: Self-pay | Admitting: Oncology

## 2019-10-27 ENCOUNTER — Inpatient Hospital Stay: Payer: Medicare Other | Attending: Oncology | Admitting: Oncology

## 2019-10-27 ENCOUNTER — Inpatient Hospital Stay: Payer: Medicare Other

## 2019-10-27 ENCOUNTER — Encounter: Payer: Self-pay | Admitting: Oncology

## 2019-10-27 ENCOUNTER — Other Ambulatory Visit: Payer: Self-pay

## 2019-10-27 ENCOUNTER — Inpatient Hospital Stay: Payer: Medicare Other | Admitting: Oncology

## 2019-10-27 ENCOUNTER — Other Ambulatory Visit: Payer: Self-pay | Admitting: *Deleted

## 2019-10-27 VITALS — BP 97/59 | HR 84 | Temp 98.2°F | Resp 16 | Ht 74.0 in | Wt 317.0 lb

## 2019-10-27 DIAGNOSIS — D509 Iron deficiency anemia, unspecified: Secondary | ICD-10-CM | POA: Insufficient documentation

## 2019-10-27 DIAGNOSIS — C9310 Chronic myelomonocytic leukemia not having achieved remission: Secondary | ICD-10-CM

## 2019-10-27 DIAGNOSIS — D473 Essential (hemorrhagic) thrombocythemia: Secondary | ICD-10-CM

## 2019-10-27 DIAGNOSIS — Z5111 Encounter for antineoplastic chemotherapy: Secondary | ICD-10-CM | POA: Diagnosis not present

## 2019-10-27 DIAGNOSIS — D75839 Thrombocytosis, unspecified: Secondary | ICD-10-CM

## 2019-10-27 LAB — COMPREHENSIVE METABOLIC PANEL
ALT: 8 U/L (ref 0–44)
AST: 12 U/L — ABNORMAL LOW (ref 15–41)
Albumin: 3.9 g/dL (ref 3.5–5.0)
Alkaline Phosphatase: 74 U/L (ref 38–126)
Anion gap: 8 (ref 5–15)
BUN: 14 mg/dL (ref 8–23)
CO2: 24 mmol/L (ref 22–32)
Calcium: 9 mg/dL (ref 8.9–10.3)
Chloride: 103 mmol/L (ref 98–111)
Creatinine, Ser: 0.95 mg/dL (ref 0.61–1.24)
GFR calc Af Amer: >60 mL/min (ref >60)
GFR calc non Af Amer: >60 mL/min (ref >60)
Glucose, Bld: 109 mg/dL — ABNORMAL HIGH (ref 70–99)
Potassium: 3.9 mmol/L (ref 3.5–5.1)
Sodium: 135 mmol/L (ref 135–145)
Total Bilirubin: 0.5 mg/dL (ref 0.3–1.2)
Total Protein: 8.1 g/dL (ref 6.5–8.1)

## 2019-10-27 LAB — CBC WITH DIFFERENTIAL/PLATELET
Abs Immature Granulocytes: 0.02 10*3/uL (ref 0.00–0.07)
Basophils Absolute: 0.2 10*3/uL — ABNORMAL HIGH (ref 0.0–0.1)
Basophils Relative: 4 %
Eosinophils Absolute: 0.1 10*3/uL (ref 0.0–0.5)
Eosinophils Relative: 1 %
HCT: 29.4 % — ABNORMAL LOW (ref 39.0–52.0)
Hemoglobin: 8.9 g/dL — ABNORMAL LOW (ref 13.0–17.0)
Immature Granulocytes: 0 %
Lymphocytes Relative: 24 %
Lymphs Abs: 1.6 10*3/uL (ref 0.7–4.0)
MCH: 22.8 pg — ABNORMAL LOW (ref 26.0–34.0)
MCHC: 30.3 g/dL (ref 30.0–36.0)
MCV: 75.4 fL — ABNORMAL LOW (ref 80.0–100.0)
Monocytes Absolute: 0.4 10*3/uL (ref 0.1–1.0)
Monocytes Relative: 6 %
Neutro Abs: 4.4 10*3/uL (ref 1.7–7.7)
Neutrophils Relative %: 65 %
Platelets: 1030 10*3/uL (ref 150–400)
RBC: 3.9 MIL/uL — ABNORMAL LOW (ref 4.22–5.81)
RDW: 22.5 % — ABNORMAL HIGH (ref 11.5–15.5)
Smear Review: INCREASED
WBC: 6.7 10*3/uL (ref 4.0–10.5)
nRBC: 0 % (ref 0.0–0.2)

## 2019-10-27 LAB — IRON AND TIBC
Iron: 13 ug/dL — ABNORMAL LOW (ref 45–182)
Saturation Ratios: 3 % — ABNORMAL LOW (ref 17.9–39.5)
TIBC: 378 ug/dL (ref 250–450)
UIBC: 365 ug/dL

## 2019-10-27 LAB — FERRITIN: Ferritin: 9 ng/mL — ABNORMAL LOW (ref 24–336)

## 2019-10-27 MED ORDER — AZACITIDINE CHEMO SQ INJECTION
50.0000 mg/m2 | Freq: Once | INTRAMUSCULAR | Status: AC
  Administered 2019-10-27: 132.5 mg via SUBCUTANEOUS
  Filled 2019-10-27: qty 5.3

## 2019-10-27 MED ORDER — ONDANSETRON HCL 4 MG PO TABS
8.0000 mg | ORAL_TABLET | Freq: Once | ORAL | Status: AC
  Administered 2019-10-27: 8 mg via ORAL
  Filled 2019-10-27: qty 2

## 2019-10-27 NOTE — Progress Notes (Signed)
Pt has no complaints, eating and drinking good, good BM, has sleep issues but has had this problem most of his life and tried all kinds of meds without any good effect for sleep.

## 2019-10-27 NOTE — Progress Notes (Signed)
Hematology/Oncology Consult note [facility]  Telephone:([phone] Fax:([phone]  Patient Care Team: Lavera Guise, MD as PCP - General (Internal Medicine) Sindy Guadeloupe, MD as Consulting Physician (Oncology)   Name of the patient: Samuel Frank  [id number]  [birth year]   Date of visit: 10/27/19  Diagnosis- CMML-1   Chief complaint/ Reason for visit-on treatment assessment prior to next cycle of Vidaza  Heme/Onc history: Patient is a 79 yr old male with no significant co morbidities other than hypertension. He has recently been having bilateral knee pain and has undergone steroid injection in his knee.Patient was noted to have a high white count on his routine exam. His white count was 19.2 on 07/22/2018 with an H&H of 11.4/35.1 and a platelet count of 470. At that point he was prescribed a course of antibiotics and a repeat blood work on 08/21/2018 showed white count of 42.3, H&H of 7.1/23 with an MCV of 84 and a platelet count of 791 and hence the patient was referred to hematology for further management.  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 In addition the core biopsy showed a small focus of fibrosis containing scattering of mast cells and eosinophils most suggestive of systemic mastocytosis.bone marrow blasts 7%.Flow cytometry showed increased number of monocytic cells representing 16% of all cells with expression for HLA-DR, CD11b, CD13, CD14, CD33, CD38, CD56 and CD56 and CD64 with [facility] CD 117 are CD34.normal cytogenetics, CBL, SRSF2, SH2B3 and TET2  Repeat bone marrow biopsy after 4 cycles of Vidaza showed less pronounced monocytic/granulocytic proliferation and overall better granulopoiesis and no increase in blasts. In addition features of systemic mastocytosis not present.  After 9 cycles of Vidaza patient was noted to have significant anemia and worsening thrombocytosis for  which a repeat bone marrow biopsy was done. Bone marrow did not show any features of progression of CMML. Overall stable findings and no increased blast.He was found to have iron deficiency and received 2 doses of Feraheme.  [facility] and underwent EGD and colonoscopy for iron deficiency anemia.Colonoscopy showed nonbleeding internal hemorrhoids. Normal mucosa in the colon. No polyps EGD showed normal esophagus and gastric mucosal atrophy. No stigmata of bleeding   Interval history-patient reports feeling well today.  Denies any complaints other than mild fatigue  ECOG PS- 1 Pain scale- 0   Review of systems- Review of Systems  Constitutional: Positive for malaise/fatigue. Negative for chills, fever and weight loss.  HENT: Negative for congestion, ear discharge and nosebleeds.   Eyes: Negative for blurred vision.  Respiratory: Negative for cough, hemoptysis, sputum production, shortness of breath and wheezing.   Cardiovascular: Negative for chest pain, palpitations, orthopnea and claudication.  Gastrointestinal: Negative for abdominal pain, blood in stool, constipation, diarrhea, heartburn, melena, nausea and vomiting.  Genitourinary: Negative for dysuria, flank pain, frequency, hematuria and urgency.  Musculoskeletal: Negative for back pain, joint pain and myalgias.  Skin: Negative for rash.  Neurological: Negative for dizziness, tingling, focal weakness, seizures, weakness and headaches.  Endo/Heme/Allergies: Does not bruise/bleed easily.  Psychiatric/Behavioral: Negative for depression and suicidal ideas. The patient does not have insomnia.      No Known Allergies   Past Medical History:  Diagnosis Date  . Anemia   . CMML (chronic myelomonocytic leukemia) ([place])   . Dyspnea   . Hypertension      Past Surgical History:  Procedure Laterality Date  . COLONOSCOPY N/A 08/28/2019   Procedure: COLONOSCOPY;  Surgeon: Toledo, Benay Pike, MD;  Location: [facility]  ENDOSCOPY;  Service: Gastroenterology;  Laterality: N/A;  . COLONOSCOPY WITH PROPOFOL N/A 05/03/2015   Procedure: COLONOSCOPY WITH PROPOFOL;  Surgeon: [name], MD;  Location: [facility] ENDOSCOPY;  Service: Gastroenterology;  Laterality: N/A;  . ESOPHAGOGASTRODUODENOSCOPY N/A 08/28/2019   Procedure: ESOPHAGOGASTRODUODENOSCOPY (EGD);  Surgeon: Toledo, Benay Pike, MD;  Location: [facility] ENDOSCOPY;  Service: Gastroenterology;  Laterality: N/A;  . ESOPHAGOGASTRODUODENOSCOPY (EGD) WITH PROPOFOL N/A 05/03/2015   Procedure: ESOPHAGOGASTRODUODENOSCOPY (EGD) WITH PROPOFOL;  Surgeon: [name], MD;  Location: [facility] ENDOSCOPY;  Service: Gastroenterology;  Laterality: N/A;  . HERNIA REPAIR     1975    Social History   Socioeconomic History  . Marital status: Divorced    Spouse name: Not on file  . Number of children: 4  . Years of education: Not on file  . Highest education level: Not on file  Occupational History  . Occupation: retired    Comment: [employer]  Tobacco Use  . Smoking status: Never Smoker  . Smokeless tobacco: Never Used  Vaping Use  . Vaping Use: Never used  Substance and Sexual Activity  . Alcohol use: No  . Drug use: No  . Sexual activity: Not on file  Other Topics Concern  . Not on file  Social History Narrative  . Not on file   Social Determinants of Health   Financial Resource Strain:   . Difficulty of Paying Living Expenses: Not on file  Food Insecurity:   . Worried About Charity fundraiser in the Last Year: Not on file  . Ran Out of Food in the Last Year: Not on file  Transportation Needs:   . Lack of Transportation (Medical): Not on file  . Lack of Transportation (Non-Medical): Not on file  Physical Activity:   . Days of Exercise per Week: Not on file  . Minutes of Exercise per Session: Not on file  Stress:   . Feeling of Stress : Not on file  Social Connections:   . Frequency of Communication with Friends and Family: Not on file  . Frequency of  Social Gatherings with Friends and Family: Not on file  . Attends Religious Services: Not on file  . Active Member of Clubs or Organizations: Not on file  . Attends Archivist Meetings: Not on file  . Marital Status: Not on file  Intimate Partner Violence:   . Fear of Current or Ex-Partner: Not on file  . Emotionally Abused: Not on file  . Physically Abused: Not on file  . Sexually Abused: Not on file    Family History  Problem Relation Age of Onset  . Alzheimer's disease Father   . Lung cancer Sister   . Cancer Sister      Current Outpatient Medications:  .  allopurinol (ZYLOPRIM) 300 MG tablet, TAKE 1 TABLET BY MOUTH EVERY DAY, Disp: 30 tablet, Rfl: 3 .  aspirin EC 81 MG tablet, Take 81 mg by mouth daily., Disp: , Rfl:  .  colchicine 0.6 MG tablet, Take 2 tablets (1.40m) by mouth at first sign of gout flare followed by 1 tablet (0.651m after 1 hour. (Max 1.39m73mithin 1 hour) (Patient not taking: Reported on 08/15/2019), Disp: , Rfl:  .  Cyanocobalamin (B-12 PO), Take 3,000 mcg by mouth daily., Disp: , Rfl:  .  cyanocobalamin 500 MCG tablet, Take 500 mcg by mouth daily., Disp: , Rfl:  .  cyclobenzaprine (FLEXERIL) 10 MG tablet, Take  1 tablet by mouth., Disp: , Rfl:  .  ibuprofen (ADVIL,MOTRIN) 200 MG tablet, Take 200 mg by mouth every 6 (six) hours as needed. , Disp: , Rfl:  .  KLOR-CON M20 20 MEQ tablet, TAKE 1 TABLET BY MOUTH TWICE A DAY, Disp: 60 tablet, Rfl: 0 .  ondansetron (ZOFRAN) 8 MG tablet, Take 1 tablet (8 mg total) by mouth 2 (two) times daily as needed (Nausea or vomiting)., Disp: 30 tablet, Rfl: 1 .  pantoprazole (PROTONIX) 40 MG tablet, Take 1 tablet (40 mg total) by mouth daily., Disp: 90 tablet, Rfl: 1 .  prochlorperazine (COMPAZINE) 10 MG tablet, Take 1 tablet (10 mg total) by mouth every 6 (six) hours as needed (Nausea or vomiting)., Disp: 30 tablet, Rfl: 2 .  tiZANidine (ZANAFLEX) 4 MG tablet, Take 4 mg by mouth every 6 (six) hours as needed. , Disp: ,  Rfl:  .  valACYclovir (VALTREX) 500 MG tablet, TAKE 1 TABLET BY MOUTH EVERY DAY, Disp: 90 tablet, Rfl: 1 .  valsartan-hydrochlorothiazide (DIOVAN-HCT) 320-25 MG tablet, Take 1/2 tablet po QD, Disp: 90 tablet, Rfl: 2  Physical exam:  Vitals:   10/27/19 0845  BP: (!) 97/59  Pulse: 84  Resp: 16  Temp: 98.2 F (36.8 C)  TempSrc: Tympanic  Weight: (!) 317 lb (143.8 kg)  Height: '6\' 2"'  (1.88 m)   Physical Exam Cardiovascular:     Rate and Rhythm: Normal rate and regular rhythm.     Heart sounds: Normal heart sounds.  Pulmonary:     Effort: Pulmonary effort is normal.     Breath sounds: Normal breath sounds.  Abdominal:     General: Bowel sounds are normal.     Palpations: Abdomen is soft.  Skin:    General: Skin is warm and dry.  Neurological:     Mental Status: He is alert and oriented to person, place, and time.      CMP Latest Ref Rng & Units 10/27/2019  Glucose 70 - 99 mg/dL 109(H)  BUN 8 - 23 mg/dL 14  Creatinine 0.61 - 1.24 mg/dL 0.95  Sodium 135 - 145 mmol/L 135  Potassium 3.5 - 5.1 mmol/L 3.9  Chloride 98 - 111 mmol/L 103  CO2 22 - 32 mmol/L 24  Calcium 8.9 - 10.3 mg/dL 9.0  Total Protein 6.5 - 8.1 g/dL 8.1  Total Bilirubin 0.3 - 1.2 mg/dL 0.5  Alkaline Phos 38 - 126 U/L 74  AST 15 - 41 U/L 12(L)  ALT 0 - 44 U/L 8   CBC Latest Ref Rng & Units 10/27/2019  WBC 4.0 - 10.5 K/uL 6.7  Hemoglobin 13.0 - 17.0 g/dL 8.9(L)  Hematocrit 39 - 52 % 29.4(L)  Platelets 150 - 400 K/uL 1,030(HH)      Assessment and plan- Patient is a 79 y.o. male history of CMML 1 who is currently getting Vidaza 5 days every month. He is here for on treatment assessment prior to next cycle of Vidaza  Counts okay to proceed with cycle 15 of Vidaza today.  He has been receiving it 5 days a month.   Thrombocytosis: Patient does have waxing and waning thrombocytosis especially on the day of Vidaza treatment which tends to resolve 2 weeks after.  This is likely secondary to CMML.  Repeat CBC  with differential in 2 weeks  Normocytic anemia: Anemia: Cause unclear.  No evidence of disease progression on his recent bone marrow biopsy.  He has had some evidence of iron deficiency anemia as well and  we have given him 2 doses of Feraheme in June 2021.  Iron studies from today are pending.  Continue to monitor  Hypotension: Patient is asymptomatic.  He takes outpatient valsartan and hydrochlorothiazide which have asked him to hold off on taking at this time.  We will repeat his blood pressure later during the week and decide if he can restart taking it  I will see him back in 4 weeks time for cycle 16.  I have encouraged the patient to take his Covid booster shot     Visit Diagnosis 1. Encounter for antineoplastic chemotherapy   2. 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 Iron deficiency anemia, unspecified iron deficiency anemia type   4. Thrombocytosis ([place])      Dr. Randa Evens, MD, MPH [facility] at [facility] [phone] 10/27/2019 9:03 AM

## 2019-10-28 ENCOUNTER — Inpatient Hospital Stay: Payer: Medicare Other

## 2019-10-28 ENCOUNTER — Other Ambulatory Visit: Payer: Self-pay | Admitting: Oncology

## 2019-10-28 VITALS — BP 122/69 | HR 93 | Temp 96.9°F | Resp 20

## 2019-10-28 DIAGNOSIS — Z5111 Encounter for antineoplastic chemotherapy: Secondary | ICD-10-CM | POA: Diagnosis not present

## 2019-10-28 DIAGNOSIS — C9310 Chronic myelomonocytic leukemia not having achieved remission: Secondary | ICD-10-CM | POA: Diagnosis not present

## 2019-10-28 DIAGNOSIS — D509 Iron deficiency anemia, unspecified: Secondary | ICD-10-CM | POA: Diagnosis not present

## 2019-10-28 MED ORDER — AZACITIDINE CHEMO SQ INJECTION
50.0000 mg/m2 | Freq: Once | INTRAMUSCULAR | Status: AC
  Administered 2019-10-28: 132.5 mg via SUBCUTANEOUS
  Filled 2019-10-28: qty 5.3

## 2019-10-28 MED ORDER — ONDANSETRON HCL 4 MG PO TABS
8.0000 mg | ORAL_TABLET | Freq: Once | ORAL | Status: AC
  Administered 2019-10-28: 8 mg via ORAL
  Filled 2019-10-28: qty 2

## 2019-10-29 ENCOUNTER — Other Ambulatory Visit: Payer: Self-pay

## 2019-10-29 ENCOUNTER — Inpatient Hospital Stay: Payer: Medicare Other

## 2019-10-29 VITALS — BP 98/62 | HR 94 | Temp 98.6°F | Resp 18 | Wt 316.2 lb

## 2019-10-29 DIAGNOSIS — D509 Iron deficiency anemia, unspecified: Secondary | ICD-10-CM | POA: Diagnosis not present

## 2019-10-29 DIAGNOSIS — C9310 Chronic myelomonocytic leukemia not having achieved remission: Secondary | ICD-10-CM | POA: Diagnosis not present

## 2019-10-29 DIAGNOSIS — Z5111 Encounter for antineoplastic chemotherapy: Secondary | ICD-10-CM | POA: Diagnosis not present

## 2019-10-29 MED ORDER — ONDANSETRON HCL 4 MG PO TABS
8.0000 mg | ORAL_TABLET | Freq: Once | ORAL | Status: AC
  Administered 2019-10-29: 8 mg via ORAL
  Filled 2019-10-29: qty 2

## 2019-10-29 MED ORDER — AZACITIDINE CHEMO SQ INJECTION
50.0000 mg/m2 | Freq: Once | INTRAMUSCULAR | Status: AC
  Administered 2019-10-29: 132.5 mg via SUBCUTANEOUS
  Filled 2019-10-29: qty 5.3

## 2019-10-30 ENCOUNTER — Other Ambulatory Visit: Payer: Self-pay

## 2019-10-30 ENCOUNTER — Inpatient Hospital Stay: Payer: Medicare Other

## 2019-10-30 VITALS — BP 123/64 | HR 98 | Temp 97.1°F | Resp 20

## 2019-10-30 DIAGNOSIS — C9310 Chronic myelomonocytic leukemia not having achieved remission: Secondary | ICD-10-CM

## 2019-10-30 DIAGNOSIS — D509 Iron deficiency anemia, unspecified: Secondary | ICD-10-CM | POA: Diagnosis not present

## 2019-10-30 DIAGNOSIS — Z5111 Encounter for antineoplastic chemotherapy: Secondary | ICD-10-CM | POA: Diagnosis not present

## 2019-10-30 MED ORDER — AZACITIDINE CHEMO SQ INJECTION
50.0000 mg/m2 | Freq: Once | INTRAMUSCULAR | Status: AC
  Administered 2019-10-30: 132.5 mg via SUBCUTANEOUS
  Filled 2019-10-30: qty 5.3

## 2019-10-30 MED ORDER — ONDANSETRON HCL 4 MG PO TABS
8.0000 mg | ORAL_TABLET | Freq: Once | ORAL | Status: AC
  Administered 2019-10-30: 8 mg via ORAL
  Filled 2019-10-30: qty 2

## 2019-10-31 ENCOUNTER — Inpatient Hospital Stay: Payer: Medicare Other

## 2019-10-31 VITALS — BP 126/62 | HR 95 | Temp 97.6°F | Resp 18

## 2019-10-31 DIAGNOSIS — C9310 Chronic myelomonocytic leukemia not having achieved remission: Secondary | ICD-10-CM | POA: Diagnosis not present

## 2019-10-31 DIAGNOSIS — Z5111 Encounter for antineoplastic chemotherapy: Secondary | ICD-10-CM | POA: Diagnosis not present

## 2019-10-31 DIAGNOSIS — D509 Iron deficiency anemia, unspecified: Secondary | ICD-10-CM | POA: Diagnosis not present

## 2019-10-31 MED ORDER — AZACITIDINE CHEMO SQ INJECTION
50.0000 mg/m2 | Freq: Once | INTRAMUSCULAR | Status: AC
  Administered 2019-10-31: 132.5 mg via SUBCUTANEOUS
  Filled 2019-10-31: qty 5.3

## 2019-10-31 MED ORDER — ONDANSETRON HCL 4 MG PO TABS
8.0000 mg | ORAL_TABLET | Freq: Once | ORAL | Status: AC
  Administered 2019-10-31: 8 mg via ORAL
  Filled 2019-10-31: qty 2

## 2019-11-04 ENCOUNTER — Other Ambulatory Visit: Payer: Self-pay | Admitting: Oncology

## 2019-11-12 ENCOUNTER — Other Ambulatory Visit: Payer: Self-pay

## 2019-11-12 ENCOUNTER — Inpatient Hospital Stay: Payer: Medicare Other | Attending: Oncology

## 2019-11-12 DIAGNOSIS — Z5111 Encounter for antineoplastic chemotherapy: Secondary | ICD-10-CM | POA: Diagnosis not present

## 2019-11-12 DIAGNOSIS — C9310 Chronic myelomonocytic leukemia not having achieved remission: Secondary | ICD-10-CM | POA: Diagnosis not present

## 2019-11-12 LAB — CBC WITH DIFFERENTIAL/PLATELET
Abs Immature Granulocytes: 1.51 10*3/uL — ABNORMAL HIGH (ref 0.00–0.07)
Basophils Absolute: 0.3 10*3/uL — ABNORMAL HIGH (ref 0.0–0.1)
Basophils Relative: 2 %
Eosinophils Absolute: 0.6 10*3/uL — ABNORMAL HIGH (ref 0.0–0.5)
Eosinophils Relative: 4 %
HCT: 30.5 % — ABNORMAL LOW (ref 39.0–52.0)
Hemoglobin: 9.2 g/dL — ABNORMAL LOW (ref 13.0–17.0)
Immature Granulocytes: 10 %
Lymphocytes Relative: 16 %
Lymphs Abs: 2.5 10*3/uL (ref 0.7–4.0)
MCH: 22.2 pg — ABNORMAL LOW (ref 26.0–34.0)
MCHC: 30.2 g/dL (ref 30.0–36.0)
MCV: 73.5 fL — ABNORMAL LOW (ref 80.0–100.0)
Monocytes Absolute: 3.4 10*3/uL — ABNORMAL HIGH (ref 0.1–1.0)
Monocytes Relative: 21 %
Neutro Abs: 7.7 10*3/uL (ref 1.7–7.7)
Neutrophils Relative %: 47 %
Platelets: 200 10*3/uL (ref 150–400)
RBC: 4.15 MIL/uL — ABNORMAL LOW (ref 4.22–5.81)
RDW: 22.9 % — ABNORMAL HIGH (ref 11.5–15.5)
Smear Review: NORMAL
WBC: 15.9 10*3/uL — ABNORMAL HIGH (ref 4.0–10.5)
nRBC: 0.3 % — ABNORMAL HIGH (ref 0.0–0.2)

## 2019-11-19 ENCOUNTER — Other Ambulatory Visit: Payer: Self-pay | Admitting: Oncology

## 2019-11-19 NOTE — Telephone Encounter (Signed)
   Ref Range & Units 3 wk ago 1 mo ago  Potassium 3.5 - 5.1 mmol/L 3.9  4.1

## 2019-11-20 ENCOUNTER — Telehealth: Payer: Self-pay | Admitting: Oncology

## 2019-11-20 NOTE — Telephone Encounter (Signed)
Writer spoke with patient and moved patient's appt to the AM. Patient was agreeable with this.

## 2019-11-24 ENCOUNTER — Inpatient Hospital Stay: Payer: Medicare Other

## 2019-11-24 ENCOUNTER — Inpatient Hospital Stay (HOSPITAL_BASED_OUTPATIENT_CLINIC_OR_DEPARTMENT_OTHER): Payer: Medicare Other | Admitting: Oncology

## 2019-11-24 ENCOUNTER — Encounter: Payer: Self-pay | Admitting: Oncology

## 2019-11-24 ENCOUNTER — Other Ambulatory Visit: Payer: Self-pay

## 2019-11-24 ENCOUNTER — Inpatient Hospital Stay: Payer: Medicare Other | Admitting: Oncology

## 2019-11-24 ENCOUNTER — Other Ambulatory Visit: Payer: Self-pay | Admitting: *Deleted

## 2019-11-24 VITALS — BP 138/75 | HR 76 | Temp 97.0°F | Resp 18

## 2019-11-24 VITALS — BP 132/77 | HR 86 | Resp 16 | Wt 325.4 lb

## 2019-11-24 DIAGNOSIS — C9310 Chronic myelomonocytic leukemia not having achieved remission: Secondary | ICD-10-CM

## 2019-11-24 DIAGNOSIS — R600 Localized edema: Secondary | ICD-10-CM

## 2019-11-24 DIAGNOSIS — D473 Essential (hemorrhagic) thrombocythemia: Secondary | ICD-10-CM | POA: Diagnosis not present

## 2019-11-24 DIAGNOSIS — D508 Other iron deficiency anemias: Secondary | ICD-10-CM

## 2019-11-24 DIAGNOSIS — D75839 Thrombocytosis, unspecified: Secondary | ICD-10-CM

## 2019-11-24 DIAGNOSIS — Z5111 Encounter for antineoplastic chemotherapy: Secondary | ICD-10-CM

## 2019-11-24 DIAGNOSIS — D509 Iron deficiency anemia, unspecified: Secondary | ICD-10-CM

## 2019-11-24 LAB — CBC WITH DIFFERENTIAL/PLATELET
Abs Immature Granulocytes: 0.03 10*3/uL (ref 0.00–0.07)
Basophils Absolute: 0.2 10*3/uL — ABNORMAL HIGH (ref 0.0–0.1)
Basophils Relative: 4 %
Eosinophils Absolute: 0.1 10*3/uL (ref 0.0–0.5)
Eosinophils Relative: 2 %
HCT: 28.2 % — ABNORMAL LOW (ref 39.0–52.0)
Hemoglobin: 8.3 g/dL — ABNORMAL LOW (ref 13.0–17.0)
Immature Granulocytes: 1 %
Lymphocytes Relative: 23 %
Lymphs Abs: 1.5 10*3/uL (ref 0.7–4.0)
MCH: 21.1 pg — ABNORMAL LOW (ref 26.0–34.0)
MCHC: 29.4 g/dL — ABNORMAL LOW (ref 30.0–36.0)
MCV: 71.6 fL — ABNORMAL LOW (ref 80.0–100.0)
Monocytes Absolute: 0.4 10*3/uL (ref 0.1–1.0)
Monocytes Relative: 6 %
Neutro Abs: 4.3 10*3/uL (ref 1.7–7.7)
Neutrophils Relative %: 64 %
Platelets: 1117 10*3/uL (ref 150–400)
RBC: 3.94 MIL/uL — ABNORMAL LOW (ref 4.22–5.81)
RDW: 23.6 % — ABNORMAL HIGH (ref 11.5–15.5)
Smear Review: INCREASED
WBC: 6.5 10*3/uL (ref 4.0–10.5)
nRBC: 0 % (ref 0.0–0.2)

## 2019-11-24 LAB — COMPREHENSIVE METABOLIC PANEL
ALT: 9 U/L (ref 0–44)
AST: 13 U/L — ABNORMAL LOW (ref 15–41)
Albumin: 4 g/dL (ref 3.5–5.0)
Alkaline Phosphatase: 69 U/L (ref 38–126)
Anion gap: 8 (ref 5–15)
BUN: 9 mg/dL (ref 8–23)
CO2: 27 mmol/L (ref 22–32)
Calcium: 8.8 mg/dL — ABNORMAL LOW (ref 8.9–10.3)
Chloride: 103 mmol/L (ref 98–111)
Creatinine, Ser: 0.88 mg/dL (ref 0.61–1.24)
GFR calc Af Amer: >60 mL/min (ref >60)
GFR calc non Af Amer: >60 mL/min (ref >60)
Glucose, Bld: 112 mg/dL — ABNORMAL HIGH (ref 70–99)
Potassium: 4 mmol/L (ref 3.5–5.1)
Sodium: 138 mmol/L (ref 135–145)
Total Bilirubin: 0.5 mg/dL (ref 0.3–1.2)
Total Protein: 7.5 g/dL (ref 6.5–8.1)

## 2019-11-24 MED ORDER — SODIUM CHLORIDE 0.9 % IV SOLN
510.0000 mg | INTRAVENOUS | Status: DC
  Administered 2019-11-24: 510 mg via INTRAVENOUS
  Filled 2019-11-24: qty 17

## 2019-11-24 MED ORDER — ONDANSETRON HCL 4 MG PO TABS
8.0000 mg | ORAL_TABLET | Freq: Once | ORAL | Status: AC
  Administered 2019-11-24: 8 mg via ORAL
  Filled 2019-11-24: qty 2

## 2019-11-24 MED ORDER — HYDROCHLOROTHIAZIDE 12.5 MG PO CAPS: 12.5000 mg | ORAL_CAPSULE | Freq: Every day | ORAL | 1 refills | Status: DC

## 2019-11-24 MED ORDER — SODIUM CHLORIDE 0.9 % IV SOLN
Freq: Once | INTRAVENOUS | Status: AC
  Filled 2019-11-24: qty 250

## 2019-11-24 MED ORDER — AZACITIDINE CHEMO SQ INJECTION
50.0000 mg/m2 | Freq: Once | INTRAMUSCULAR | Status: AC
  Administered 2019-11-24: 132.5 mg via SUBCUTANEOUS
  Filled 2019-11-24: qty 5.3

## 2019-11-24 NOTE — Progress Notes (Signed)
Hematology/Oncology Consult note [facility]  Telephone:([phone] [phone]  Patient Care Team: Lavera Guise, MD as PCP - General (Internal Medicine) Sindy Guadeloupe, MD as Consulting Physician (Oncology)   Name of the patient: Samuel Frank  [id number]  Dec 25, 1940   Date of visit: 11/24/19  Diagnosis- CMML-1  Chief complaint/ Reason for visit-on treatment assessment prior to next cycle of Vidaza  Heme/Onc history: Patient is a 79 yr old male with no significant co morbidities other than hypertension. He has recently been having bilateral knee pain and has undergone steroid injection in his knee.Patient was noted to have a high white count on his routine exam. His white count was 19.2 on 07/22/2018 with an H&H of 11.4/35.1 and a platelet count of 470. At that point he was prescribed a course of antibiotics and a repeat blood work on 08/21/2018 showed white count of 42.3, H&H of 7.1/23 with an MCV of 84 and a platelet count of 791 and hence the patient was referred to hematology for further management.  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 In addition the core biopsy showed a small focus of fibrosis containing scattering of mast cells and eosinophils most suggestive of systemic mastocytosis.bone marrow blasts 7%.Flow cytometry showed increased number of monocytic cells representing 16% of all cells with expression for HLA-DR, CD11b, CD13, CD14, CD33, CD38, CD56 and CD56 and CD64 with [facility] CD 117 are CD34.normal cytogenetics, CBL, SRSF2, SH2B3 and TET2  Repeat bone marrow biopsy after 4 cycles of Vidaza showed less pronounced monocytic/granulocytic proliferation and overall better granulopoiesis and no increase in blasts. In addition features of systemic mastocytosis not present.  After 9 cycles of Vidaza patient was noted to have significant anemia and worsening thrombocytosis for which  a repeat bone marrow biopsy was done. Bone marrow did not show any features of progression of CMML. Overall stable findings and no increased blast.He was found to have iron deficiency and received 2 doses of Feraheme.  [facility] EGD and colonoscopy for iron deficiency anemia.Colonoscopy showed nonbleeding internal hemorrhoids. Normal mucosa in the colon. No polyps EGD showed normal esophagus and gastric mucosal atrophy. No stigmata of bleeding   Interval history-reports bilateral leg swelling after he stopped valsartan hydrochlorothiazide.  Denies other complaints at this time  ECOG PS- 1 Pain scale- 0   Review of systems- Review of Systems  Constitutional: Negative for chills, fever, malaise/fatigue and weight loss.  HENT: Negative for congestion, ear discharge and nosebleeds.   Eyes: Negative for blurred vision.  Respiratory: Negative for cough, hemoptysis, sputum production, shortness of breath and wheezing.   Cardiovascular: Positive for leg swelling. Negative for chest pain, palpitations, orthopnea and claudication.  Gastrointestinal: Negative for abdominal pain, blood in stool, constipation, diarrhea, heartburn, melena, nausea and vomiting.  Genitourinary: Negative for dysuria, flank pain, frequency, hematuria and urgency.  Musculoskeletal: Negative for back pain, joint pain and myalgias.  Skin: Negative for rash.  Neurological: Negative for dizziness, tingling, focal weakness, seizures, weakness and headaches.  Endo/Heme/Allergies: Does not bruise/bleed easily.  Psychiatric/Behavioral: Negative for depression and suicidal ideas. The patient does not have insomnia.        No Known Allergies   Past Medical History:  Diagnosis Date  . Anemia   . CMML (chronic myelomonocytic leukemia) ([place])   . Dyspnea   . Hypertension      Past Surgical History:  Procedure Laterality Date  . COLONOSCOPY  N/A 08/28/2019   Procedure: COLONOSCOPY;   Surgeon: Toledo, Benay Pike, MD;  Location: [facility] ENDOSCOPY;  Service: Gastroenterology;  Laterality: N/A;  . COLONOSCOPY WITH PROPOFOL N/A 05/03/2015   Procedure: COLONOSCOPY WITH PROPOFOL;  Surgeon: [name], MD;  Location: [facility] ENDOSCOPY;  Service: Gastroenterology;  Laterality: N/A;  . ESOPHAGOGASTRODUODENOSCOPY N/A 08/28/2019   Procedure: ESOPHAGOGASTRODUODENOSCOPY (EGD);  Surgeon: Toledo, Benay Pike, MD;  Location: [facility] ENDOSCOPY;  Service: Gastroenterology;  Laterality: N/A;  . ESOPHAGOGASTRODUODENOSCOPY (EGD) WITH PROPOFOL N/A 05/03/2015   Procedure: ESOPHAGOGASTRODUODENOSCOPY (EGD) WITH PROPOFOL;  Surgeon: [name], MD;  Location: [facility] ENDOSCOPY;  Service: Gastroenterology;  Laterality: N/A;  . HERNIA REPAIR     1975    Social History   Socioeconomic History  . Marital status: Divorced    Spouse name: Not on file  . Number of children: 4  . Years of education: Not on file  . Highest education level: Not on file  Occupational History  . Occupation: retired    Comment: [employer]  Tobacco Use  . Smoking status: Never Smoker  . Smokeless tobacco: Never Used  Vaping Use  . Vaping Use: Never used  Substance and Sexual Activity  . Alcohol use: No  . Drug use: No  . Sexual activity: Not on file  Other Topics Concern  . Not on file  Social History Narrative  . Not on file   Social Determinants of Health   Financial Resource Strain:   . Difficulty of Paying Living Expenses: Not on file  Food Insecurity:   . Worried About Charity fundraiser in the Last Year: Not on file  . Ran Out of Food in the Last Year: Not on file  Transportation Needs:   . Lack of Transportation (Medical): Not on file  . Lack of Transportation (Non-Medical): Not on file  Physical Activity:   . Days of Exercise per Week: Not on file  . Minutes of Exercise per Session: Not on file  Stress:   . Feeling of Stress : Not on file  Social Connections:   . Frequency of Communication with  Friends and Family: Not on file  . Frequency of Social Gatherings with Friends and Family: Not on file  . Attends Religious Services: Not on file  . Active Member of Clubs or Organizations: Not on file  . Attends Archivist Meetings: Not on file  . Marital Status: Not on file  Intimate Partner Violence:   . Fear of Current or Ex-Partner: Not on file  . Emotionally Abused: Not on file  . Physically Abused: Not on file  . Sexually Abused: Not on file    Family History  Problem Relation Age of Onset  . Alzheimer's disease Father   . Lung cancer Sister   . Cancer Sister      Current Outpatient Medications:  .  allopurinol (ZYLOPRIM) 300 MG tablet, TAKE 1 TABLET BY MOUTH EVERY DAY, Disp: 30 tablet, Rfl: 3 .  aspirin EC 81 MG tablet, Take 81 mg by mouth daily., Disp: , Rfl:  .  Cyanocobalamin (B-12 PO), Take 3,000 mcg by mouth daily., Disp: , Rfl:  .  KLOR-CON M20 20 MEQ tablet, TAKE 1 TABLET BY MOUTH TWICE A DAY, Disp: 60 tablet, Rfl: 0 .  pantoprazole (PROTONIX) 40 MG tablet, Take 1 tablet (40 mg total) by mouth daily., Disp: 90 tablet, Rfl: 1 .  valACYclovir (VALTREX) 500 MG tablet, TAKE 1 TABLET BY MOUTH EVERY DAY, Disp: 90 tablet,  Rfl: 1 .  colchicine 0.6 MG tablet, Take 2 tablets (1.76m) by mouth at first sign of gout flare followed by 1 tablet (0.641m after 1 hour. (Max 1.26m63mithin 1 hour) (Patient not taking: Reported on 08/15/2019), Disp: , Rfl:  .  ibuprofen (ADVIL,MOTRIN) 200 MG tablet, Take 200 mg by mouth every 6 (six) hours as needed.  (Patient not taking: Reported on 10/27/2019), Disp: , Rfl:  .  ondansetron (ZOFRAN) 8 MG tablet, Take 1 tablet (8 mg total) by mouth 2 (two) times daily as needed (Nausea or vomiting). (Patient not taking: Reported on 10/27/2019), Disp: 30 tablet, Rfl: 1 .  prochlorperazine (COMPAZINE) 10 MG tablet, Take 1 tablet (10 mg total) by mouth every 6 (six) hours as needed (Nausea or vomiting). (Patient not taking: Reported on 10/27/2019), Disp:  30 tablet, Rfl: 2 .  valsartan-hydrochlorothiazide (DIOVAN-HCT) 320-25 MG tablet, Take 1/2 tablet po QD (Patient not taking: Reported on 11/24/2019), Disp: 90 tablet, Rfl: 2  Physical exam:  Vitals:   11/24/19 0849  BP: 132/77  Pulse: 86  Resp: 16  SpO2: 100%  Weight: (!) 325 lb 6.4 oz (147.6 kg)   Physical Exam Cardiovascular:     Rate and Rhythm: Normal rate and regular rhythm.     Heart sounds: Normal heart sounds.  Pulmonary:     Effort: Pulmonary effort is normal.     Breath sounds: Normal breath sounds.  Abdominal:     General: Bowel sounds are normal.     Palpations: Abdomen is soft.  Musculoskeletal:     Right lower leg: Edema present.     Left lower leg: Edema present.  Skin:    General: Skin is warm and dry.  Neurological:     Mental Status: He is alert and oriented to person, place, and time.      CMP Latest Ref Rng & Units 11/24/2019  Glucose 70 - 99 mg/dL 112(H)  BUN 8 - 23 mg/dL 9  Creatinine 0.61 - 1.24 mg/dL 0.88  Sodium 135 - 145 mmol/L 138  Potassium 3.5 - 5.1 mmol/L 4.0  Chloride 98 - 111 mmol/L 103  CO2 22 - 32 mmol/L 27  Calcium 8.9 - 10.3 mg/dL 8.8(L)  Total Protein 6.5 - 8.1 g/dL 7.5  Total Bilirubin 0.3 - 1.2 mg/dL 0.5  Alkaline Phos 38 - 126 U/L 69  AST 15 - 41 U/L 13(L)  ALT 0 - 44 U/L 9   CBC Latest Ref Rng & Units 11/24/2019  WBC 4.0 - 10.5 K/uL 6.5  Hemoglobin 13.0 - 17.0 g/dL 8.3(L)  Hematocrit 39 - 52 % 28.2(L)  Platelets 150 - 400 K/uL 1,117(HH)      Assessment and plan- Patient is a [age]o. male history of CMML 1 who is currently getting Vidaza 5 days every month. He is here for on treatment assessment prior to next cycle of VidAdaire okay to proceed with next cycle of Vidaza today.  Patient has had longstanding thrombocytosis on the day of Vidaza treatment which typically resolves 2 weeks later.  I suspect some degree of thrombocytosis may also be reactive in the setting of iron deficiency.  He will receive Vidaza day 1  to day 5 and I will see him back in 4 weeks for next cycle.  Anemia: Likely secondary to underlying CMML as well as iron deficiency.  Labs done during last cycle again suggested iron deficiency.  We will therefore proceed with 2 doses of Feraheme this week.  I will  also touch base with [facility] to see if there would be role for capsule endoscopy given his recurrent iron deficiency.  Upper endoscopy and colonoscopy in June 2021 did not reveal any bleeding  Pedal edema: Valsartan hydrochlorothiazide combination pill was stopped due to hypotension.  I will restart hydrochlorothiazide alone at 12.5 mg daily   Visit Diagnosis 1. 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 Other iron deficiency anemia   3. Encounter for antineoplastic chemotherapy   4. Pedal edema   5. Thrombocytosis ([place])      Dr. Randa Evens, MD, MPH [facility] [phone] 11/24/2019 9:25 AM

## 2019-11-25 ENCOUNTER — Other Ambulatory Visit: Payer: Self-pay

## 2019-11-25 ENCOUNTER — Inpatient Hospital Stay: Payer: Medicare Other

## 2019-11-25 VITALS — BP 144/85 | HR 94 | Temp 98.4°F | Resp 18

## 2019-11-25 DIAGNOSIS — C9310 Chronic myelomonocytic leukemia not having achieved remission: Secondary | ICD-10-CM | POA: Diagnosis not present

## 2019-11-25 DIAGNOSIS — Z5111 Encounter for antineoplastic chemotherapy: Secondary | ICD-10-CM | POA: Diagnosis not present

## 2019-11-25 MED ORDER — AZACITIDINE CHEMO SQ INJECTION
50.0000 mg/m2 | Freq: Once | INTRAMUSCULAR | Status: AC
  Administered 2019-11-25: 132.5 mg via SUBCUTANEOUS
  Filled 2019-11-25: qty 5.3

## 2019-11-25 MED ORDER — ONDANSETRON HCL 4 MG PO TABS
8.0000 mg | ORAL_TABLET | Freq: Once | ORAL | Status: AC
  Administered 2019-11-25: 8 mg via ORAL
  Filled 2019-11-25: qty 2

## 2019-11-26 ENCOUNTER — Inpatient Hospital Stay: Payer: Medicare Other

## 2019-11-26 VITALS — BP 115/91 | HR 95 | Temp 98.9°F | Resp 18

## 2019-11-26 DIAGNOSIS — C9310 Chronic myelomonocytic leukemia not having achieved remission: Secondary | ICD-10-CM

## 2019-11-26 DIAGNOSIS — Z5111 Encounter for antineoplastic chemotherapy: Secondary | ICD-10-CM | POA: Diagnosis not present

## 2019-11-26 MED ORDER — AZACITIDINE CHEMO SQ INJECTION
50.0000 mg/m2 | Freq: Once | INTRAMUSCULAR | Status: AC
  Administered 2019-11-26: 132.5 mg via SUBCUTANEOUS
  Filled 2019-11-26: qty 5.3

## 2019-11-26 MED ORDER — ONDANSETRON HCL 4 MG PO TABS
8.0000 mg | ORAL_TABLET | Freq: Once | ORAL | Status: AC
  Administered 2019-11-26: 8 mg via ORAL
  Filled 2019-11-26: qty 2

## 2019-11-27 ENCOUNTER — Other Ambulatory Visit: Payer: Self-pay

## 2019-11-27 ENCOUNTER — Inpatient Hospital Stay: Payer: Medicare Other

## 2019-11-27 VITALS — BP 123/66 | HR 96 | Temp 97.8°F | Resp 20

## 2019-11-27 DIAGNOSIS — C9310 Chronic myelomonocytic leukemia not having achieved remission: Secondary | ICD-10-CM

## 2019-11-27 DIAGNOSIS — Z5111 Encounter for antineoplastic chemotherapy: Secondary | ICD-10-CM | POA: Diagnosis not present

## 2019-11-27 MED ORDER — AZACITIDINE CHEMO SQ INJECTION
50.0000 mg/m2 | Freq: Once | INTRAMUSCULAR | Status: AC
  Administered 2019-11-27: 132.5 mg via SUBCUTANEOUS
  Filled 2019-11-27: qty 5.3

## 2019-11-27 MED ORDER — ONDANSETRON HCL 4 MG PO TABS
8.0000 mg | ORAL_TABLET | Freq: Once | ORAL | Status: AC
  Administered 2019-11-27: 8 mg via ORAL
  Filled 2019-11-27: qty 2

## 2019-11-28 ENCOUNTER — Inpatient Hospital Stay: Payer: Medicare Other

## 2019-11-28 VITALS — BP 136/68 | HR 90 | Temp 96.4°F

## 2019-11-28 DIAGNOSIS — D509 Iron deficiency anemia, unspecified: Secondary | ICD-10-CM

## 2019-11-28 DIAGNOSIS — C9310 Chronic myelomonocytic leukemia not having achieved remission: Secondary | ICD-10-CM | POA: Diagnosis not present

## 2019-11-28 DIAGNOSIS — Z5111 Encounter for antineoplastic chemotherapy: Secondary | ICD-10-CM | POA: Diagnosis not present

## 2019-11-28 MED ORDER — ONDANSETRON HCL 4 MG PO TABS
8.0000 mg | ORAL_TABLET | Freq: Once | ORAL | Status: AC
  Administered 2019-11-28: 8 mg via ORAL
  Filled 2019-11-28: qty 2

## 2019-11-28 MED ORDER — SODIUM CHLORIDE 0.9 % IV SOLN
510.0000 mg | INTRAVENOUS | Status: DC
  Administered 2019-11-28: 510 mg via INTRAVENOUS
  Filled 2019-11-28: qty 510

## 2019-11-28 MED ORDER — AZACITIDINE CHEMO SQ INJECTION
50.0000 mg/m2 | Freq: Once | INTRAMUSCULAR | Status: AC
  Administered 2019-11-28: 132.5 mg via SUBCUTANEOUS
  Filled 2019-11-28: qty 5.3

## 2019-11-28 MED ORDER — SODIUM CHLORIDE 0.9 % IV SOLN
Freq: Once | INTRAVENOUS | Status: AC
  Filled 2019-11-28: qty 250

## 2019-12-01 ENCOUNTER — Encounter: Payer: Self-pay | Admitting: Nurse Practitioner

## 2019-12-01 ENCOUNTER — Ambulatory Visit (INDEPENDENT_AMBULATORY_CARE_PROVIDER_SITE_OTHER): Payer: Medicare Other | Admitting: Nurse Practitioner

## 2019-12-01 ENCOUNTER — Other Ambulatory Visit: Payer: Self-pay

## 2019-12-01 VITALS — BP 144/83 | HR 97 | Temp 97.5°F | Resp 16 | Ht 74.0 in | Wt 324.6 lb

## 2019-12-01 DIAGNOSIS — Z23 Encounter for immunization: Secondary | ICD-10-CM | POA: Diagnosis not present

## 2019-12-01 DIAGNOSIS — I10 Essential (primary) hypertension: Secondary | ICD-10-CM | POA: Diagnosis not present

## 2019-12-01 DIAGNOSIS — I700 Atherosclerosis of aorta: Secondary | ICD-10-CM | POA: Insufficient documentation

## 2019-12-01 DIAGNOSIS — C9310 Chronic myelomonocytic leukemia not having achieved remission: Secondary | ICD-10-CM

## 2019-12-01 NOTE — Progress Notes (Signed)
[facility] [place], [place] [address]  Internal MEDICINE  Office Visit Note  Patient Name: Samuel Frank  [id number]  [id number]  Date of Service: 12/14/2019  Chief Complaint  Patient presents with  . Hypertension  . Anemia  . Quality Metric Gaps    flu vaccine    The patient is here for routine follow up visit. In general, blood pressure is well controlled. He is treated at cancer center for chronic myelomonocytic leukemia. He states that he is doing well with treatments. States that is did develop some swelling in his feet and ankles which did improve after fluid pill was started. His labs are monitored frequently. He would like to get flu shot today while in the office. He reports no complaints or concerns today.  He would like to get flu shot while he is in office today.         Current Medication: Outpatient Encounter Medications as of 12/01/2019  Medication Sig  . allopurinol (ZYLOPRIM) 300 MG tablet TAKE 1 TABLET BY MOUTH EVERY DAY  . aspirin EC 81 MG tablet Take 81 mg by mouth daily.  . Cyanocobalamin (B-12 PO) Take 3,000 mcg by mouth daily.  . hydrochlorothiazide (MICROZIDE) 12.5 MG capsule Take 1 capsule (12.5 mg total) by mouth daily.  [name] KLOR-CON M20 20 MEQ tablet TAKE 1 TABLET BY MOUTH TWICE A DAY  . pantoprazole (PROTONIX) 40 MG tablet Take 1 tablet (40 mg total) by mouth daily.  . valACYclovir (VALTREX) 500 MG tablet TAKE 1 TABLET BY MOUTH EVERY DAY  . [DISCONTINUED] colchicine 0.6 MG tablet Take 2 tablets (1.2mg ) by mouth at first sign of gout flare followed by 1 tablet (0.6mg ) after 1 hour. (Max 1.8mg  within 1 hour) (Patient not taking: Reported on 08/15/2019)  . [DISCONTINUED] ibuprofen (ADVIL,MOTRIN) 200 MG tablet Take 200 mg by mouth every 6 (six) hours as needed.  (Patient not taking: Reported on 10/27/2019)  . [DISCONTINUED] ondansetron (ZOFRAN) 8 MG tablet Take 1 tablet (8 mg total) by mouth 2 (two) times daily as needed  (Nausea or vomiting). (Patient not taking: Reported on 10/27/2019)  . [DISCONTINUED] prochlorperazine (COMPAZINE) 10 MG tablet Take 1 tablet (10 mg total) by mouth every 6 (six) hours as needed (Nausea or vomiting). (Patient not taking: Reported on 10/27/2019)  . [DISCONTINUED] valsartan-hydrochlorothiazide (DIOVAN-HCT) 320-25 MG tablet Take 1/2 tablet po QD (Patient not taking: Reported on 11/24/2019)   No facility-administered encounter medications on file as of 12/01/2019.    Surgical History: Past Surgical History:  Procedure Laterality Date  . COLONOSCOPY N/A 08/28/2019   Procedure: COLONOSCOPY;  Surgeon: Toledo, Benay Pike, MD;  Location: [facility] ENDOSCOPY;  Service: Gastroenterology;  Laterality: N/A;  . COLONOSCOPY WITH PROPOFOL N/A 05/03/2015   Procedure: COLONOSCOPY WITH PROPOFOL;  Surgeon: [name], MD;  Location: [facility] ENDOSCOPY;  Service: Gastroenterology;  Laterality: N/A;  . ESOPHAGOGASTRODUODENOSCOPY N/A 08/28/2019   Procedure: ESOPHAGOGASTRODUODENOSCOPY (EGD);  Surgeon: Toledo, Benay Pike, MD;  Location: [facility] ENDOSCOPY;  Service: Gastroenterology;  Laterality: N/A;  . ESOPHAGOGASTRODUODENOSCOPY (EGD) WITH PROPOFOL N/A 05/03/2015   Procedure: ESOPHAGOGASTRODUODENOSCOPY (EGD) WITH PROPOFOL;  Surgeon: [name], MD;  Location: [facility] ENDOSCOPY;  Service: Gastroenterology;  Laterality: N/A;  . HERNIA REPAIR     1975    Medical History: Past Medical History:  Diagnosis Date  . Anemia   . CMML (chronic myelomonocytic leukemia) ([place])   . Dyspnea   . Hypertension     Family History: Family History  Problem Relation  Age of Onset  . Alzheimer's disease Father   . Lung cancer Sister   . Cancer Sister     Social History   Socioeconomic History  . Marital status: Divorced    Spouse name: Not on file  . Number of children: 4  . Years of education: Not on file  . Highest education level: Not on file  Occupational History  . Occupation: retired    Comment: [employer]   Tobacco Use  . Smoking status: Never Smoker  . Smokeless tobacco: Never Used  Vaping Use  . Vaping Use: Never used  Substance and Sexual Activity  . Alcohol use: No  . Drug use: No  . Sexual activity: Not on file  Other Topics Concern  . Not on file  Social History Narrative  . Not on file   Social Determinants of Health   Financial Resource Strain:   . Difficulty of Paying Living Expenses: Not on file  Food Insecurity:   . Worried About Charity fundraiser in the Last Year: Not on file  . Ran Out of Food in the Last Year: Not on file  Transportation Needs:   . Lack of Transportation (Medical): Not on file  . Lack of Transportation (Non-Medical): Not on file  Physical Activity:   . Days of Exercise per Week: Not on file  . Minutes of Exercise per Session: Not on file  Stress:   . Feeling of Stress : Not on file  Social Connections:   . Frequency of Communication with Friends and Family: Not on file  . Frequency of Social Gatherings with Friends and Family: Not on file  . Attends Religious Services: Not on file  . Active Member of Clubs or Organizations: Not on file  . Attends Archivist Meetings: Not on file  . Marital Status: Not on file  Intimate Partner Violence:   . Fear of Current or Ex-Partner: Not on file  . Emotionally Abused: Not on file  . Physically Abused: Not on file  . Sexually Abused: Not on file      Review of Systems  Constitutional: Positive for fatigue. Negative for activity change, chills and unexpected weight change.  HENT: Negative for congestion, postnasal drip, rhinorrhea, sneezing and sore throat.   Respiratory: Negative for cough, chest tightness, shortness of breath and wheezing.   Cardiovascular: Negative for chest pain and palpitations.       Blood pressure continues to be well managed.   Gastrointestinal: Negative for abdominal pain, constipation, diarrhea, nausea and vomiting.  Endocrine: Negative for cold intolerance,  heat intolerance, polydipsia and polyuria.  Musculoskeletal: Positive for arthralgias and myalgias. Negative for back pain, joint swelling and neck pain.  Skin: Negative for rash.  Allergic/Immunologic: Negative for environmental allergies.  Neurological: Negative for dizziness, tremors, numbness and headaches.  Hematological: Negative for adenopathy. Does not bruise/bleed easily.  Psychiatric/Behavioral: Negative for behavioral problems (Depression), sleep disturbance and suicidal ideas. The patient is not nervous/anxious.     Today's Vitals   12/01/19 1109  BP: (!) 144/83  Pulse: 97  Resp: 16  Temp: (!) 97.5 F (36.4 C)  SpO2: 97%  Weight: (!) 324 lb 9.6 oz (147.2 kg)  Height: 6\' 2"  (1.88 m)   Body mass index is 41.68 kg/m.  Physical Exam Vitals and nursing note reviewed.  Constitutional:      General: He is not in acute distress.    Appearance: Normal appearance. He is well-developed. He is obese. He  is not diaphoretic.  HENT:     Head: Normocephalic and atraumatic.     Nose: Nose normal.     Mouth/Throat:     Pharynx: No oropharyngeal exudate.  Eyes:     Pupils: Pupils are equal, round, and reactive to light.  Neck:     Thyroid: No thyromegaly.     Vascular: No carotid bruit or JVD.     Trachea: No tracheal deviation.  Cardiovascular:     Rate and Rhythm: Normal rate and regular rhythm.     Heart sounds: Normal heart sounds. No murmur heard.  No friction rub. No gallop.   Pulmonary:     Effort: Pulmonary effort is normal. No respiratory distress.     Breath sounds: Normal breath sounds. No wheezing or rales.  Chest:     Chest wall: No tenderness.  Abdominal:     Palpations: Abdomen is soft.  Musculoskeletal:        General: Normal range of motion.     Cervical back: Normal range of motion and neck supple.  Lymphadenopathy:     Cervical: No cervical adenopathy.  Skin:    General: Skin is warm and dry.  Neurological:     General: No focal deficit present.      Mental Status: He is alert and oriented to person, place, and time.     Cranial Nerves: No cranial nerve deficit.  Psychiatric:        Mood and Affect: Mood normal.        Behavior: Behavior normal.        Thought Content: Thought content normal.        Judgment: Judgment normal.    Assessment/Plan: 1. Essential hypertension, benign Stable. Continue bp medication as prescribed   2. Atherosclerosis of aorta (HCC) Recent CT showing calcification of thoracic abdominal aorta along with two vessel coronary artery disease. He is currently on daily aspirin regimen with well controlled blood pressure.  Echocardiogram done 08/2019 showing normal LVEF with mild LVH. There is age related delayed relaxation which is normal, and evidence of pulmonary hypertension. Will continue to monitor  3. Needs flu shot Flu vaccine administered today.  - Flu Vaccine MDCK QUAD PF  4. 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   General Counseling: isaul landi understanding of the findings of todays visit and agrees with plan of treatment. I have discussed any further diagnostic evaluation that may be needed or ordered today. We also reviewed his medications today. he has been encouraged to call the office with any questions or concerns that should arise related to todays visit.  This patient was seen by Leretha Pol FNP Collaboration with Dr Lavera Guise as a part of collaborative care agreement  Orders Placed This Encounter  Procedures  . Flu Vaccine MDCK QUAD PF     Total time spent: 30 Minutes   Time spent includes review of chart, medications, test results, and follow up plan with the patient.      Dr Lavera Guise Internal medicine

## 2019-12-14 DIAGNOSIS — Z23 Encounter for immunization: Secondary | ICD-10-CM | POA: Insufficient documentation

## 2019-12-16 ENCOUNTER — Other Ambulatory Visit: Payer: Self-pay | Admitting: Oncology

## 2019-12-22 ENCOUNTER — Inpatient Hospital Stay: Payer: Medicare Other

## 2019-12-22 ENCOUNTER — Inpatient Hospital Stay (HOSPITAL_BASED_OUTPATIENT_CLINIC_OR_DEPARTMENT_OTHER): Payer: Medicare Other | Admitting: Oncology

## 2019-12-22 ENCOUNTER — Encounter: Payer: Self-pay | Admitting: Oncology

## 2019-12-22 ENCOUNTER — Inpatient Hospital Stay: Payer: Medicare Other | Attending: Oncology

## 2019-12-22 ENCOUNTER — Other Ambulatory Visit: Payer: Self-pay

## 2019-12-22 VITALS — BP 137/76 | HR 90 | Temp 97.1°F | Resp 16 | Wt 316.1 lb

## 2019-12-22 DIAGNOSIS — C9310 Chronic myelomonocytic leukemia not having achieved remission: Secondary | ICD-10-CM

## 2019-12-22 DIAGNOSIS — Z5111 Encounter for antineoplastic chemotherapy: Secondary | ICD-10-CM | POA: Insufficient documentation

## 2019-12-22 LAB — COMPREHENSIVE METABOLIC PANEL
ALT: 10 U/L (ref 0–44)
AST: 13 U/L — ABNORMAL LOW (ref 15–41)
Albumin: 4 g/dL (ref 3.5–5.0)
Alkaline Phosphatase: 73 U/L (ref 38–126)
Anion gap: 5 (ref 5–15)
BUN: 10 mg/dL (ref 8–23)
CO2: 28 mmol/L (ref 22–32)
Calcium: 8.8 mg/dL — ABNORMAL LOW (ref 8.9–10.3)
Chloride: 105 mmol/L (ref 98–111)
Creatinine, Ser: 0.87 mg/dL (ref 0.61–1.24)
GFR, Estimated: >60 mL/min (ref >60)
Glucose, Bld: 103 mg/dL — ABNORMAL HIGH (ref 70–99)
Potassium: 3.8 mmol/L (ref 3.5–5.1)
Sodium: 138 mmol/L (ref 135–145)
Total Bilirubin: 0.8 mg/dL (ref 0.3–1.2)
Total Protein: 7.6 g/dL (ref 6.5–8.1)

## 2019-12-22 LAB — CBC WITH DIFFERENTIAL/PLATELET
Abs Immature Granulocytes: 0.04 10*3/uL (ref 0.00–0.07)
Basophils Absolute: 0.2 10*3/uL — ABNORMAL HIGH (ref 0.0–0.1)
Basophils Relative: 3 %
Eosinophils Absolute: 0 10*3/uL (ref 0.0–0.5)
Eosinophils Relative: 1 %
HCT: 38.7 % — ABNORMAL LOW (ref 39.0–52.0)
Hemoglobin: 11.6 g/dL — ABNORMAL LOW (ref 13.0–17.0)
Immature Granulocytes: 1 %
Lymphocytes Relative: 19 %
Lymphs Abs: 1.5 10*3/uL (ref 0.7–4.0)
MCH: 23.5 pg — ABNORMAL LOW (ref 26.0–34.0)
MCHC: 30 g/dL (ref 30.0–36.0)
MCV: 78.5 fL — ABNORMAL LOW (ref 80.0–100.0)
Monocytes Absolute: 0.4 10*3/uL (ref 0.1–1.0)
Monocytes Relative: 5 %
Neutro Abs: 5.9 10*3/uL (ref 1.7–7.7)
Neutrophils Relative %: 71 %
Platelets: 707 10*3/uL — ABNORMAL HIGH (ref 150–400)
RBC: 4.93 MIL/uL (ref 4.22–5.81)
RDW: 31.6 % — ABNORMAL HIGH (ref 11.5–15.5)
Smear Review: INCREASED
WBC: 8.2 10*3/uL (ref 4.0–10.5)
nRBC: 0.2 % (ref 0.0–0.2)

## 2019-12-22 MED ORDER — AZACITIDINE CHEMO SQ INJECTION
50.0000 mg/m2 | Freq: Once | INTRAMUSCULAR | Status: AC
  Administered 2019-12-22: 132.5 mg via SUBCUTANEOUS
  Filled 2019-12-22: qty 5.3

## 2019-12-22 MED ORDER — ONDANSETRON HCL 4 MG PO TABS
8.0000 mg | ORAL_TABLET | Freq: Once | ORAL | Status: AC
  Administered 2019-12-22: 8 mg via ORAL
  Filled 2019-12-22: qty 2

## 2019-12-23 ENCOUNTER — Inpatient Hospital Stay: Payer: Medicare Other

## 2019-12-23 VITALS — BP 111/64 | HR 100 | Temp 97.4°F

## 2019-12-23 DIAGNOSIS — C9310 Chronic myelomonocytic leukemia not having achieved remission: Secondary | ICD-10-CM

## 2019-12-23 DIAGNOSIS — Z5111 Encounter for antineoplastic chemotherapy: Secondary | ICD-10-CM | POA: Diagnosis not present

## 2019-12-23 MED ORDER — AZACITIDINE CHEMO SQ INJECTION
50.0000 mg/m2 | Freq: Once | INTRAMUSCULAR | Status: AC
  Administered 2019-12-23: 132.5 mg via SUBCUTANEOUS
  Filled 2019-12-23: qty 5.3

## 2019-12-23 MED ORDER — ONDANSETRON HCL 4 MG PO TABS
8.0000 mg | ORAL_TABLET | Freq: Once | ORAL | Status: AC
  Administered 2019-12-23: 8 mg via ORAL
  Filled 2019-12-23: qty 2

## 2019-12-23 NOTE — Progress Notes (Signed)
Hematology/Oncology Consult note [facility]  [phone] [phone]  Patient Care Team: Lavera Guise, MD as PCP - General (Internal Medicine) Sindy Guadeloupe, MD as Consulting Physician (Oncology)   Name of the patient: Samuel Frank  [id number]  [birth year]   Date of visit: 12/23/19  Diagnosis- CMML-1  Chief complaint/ Reason for visit-on treatment assessment prior to next cycle of Vidaza  Heme/Onc history: Patient is a 79 yr old male with no significant co morbidities other than hypertension. He has recently been having bilateral knee pain and has undergone steroid injection in his knee.Patient was noted to have a high white count on his routine exam. His white count was 19.2 on 07/22/2018 with an H&H of 11.4/35.1 and a platelet count of 470. At that point he was prescribed a course of antibiotics and a repeat blood work on 08/21/2018 showed white count of 42.3, H&H of 7.1/23 with an MCV of 84 and a platelet count of 791 and hence the patient was referred to hematology for further management.  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 In addition the core biopsy showed a small focus of fibrosis containing scattering of mast cells and eosinophils most suggestive of systemic mastocytosis.bone marrow blasts 7%.Flow cytometry showed increased number of monocytic cells representing 16% of all cells with expression for HLA-DR, CD11b, CD13, CD14, CD33, CD38, CD56 and CD56 and CD64 with [facility] CD 117 are CD34.normal cytogenetics, CBL, SRSF2, SH2B3 and TET2  Repeat bone marrow biopsy after 4 cycles of Vidaza showed less pronounced monocytic/granulocytic proliferation and overall better granulopoiesis and no increase in blasts. In addition features of systemic mastocytosis not present.  After 9 cycles of Vidaza patient was noted to have significant anemia and worsening thrombocytosis for  which a repeat bone marrow biopsy was done. Bone marrow did not show any features of progression of CMML. Overall stable findings and no increased blast.He was found to have iron deficiency and received 2 doses of Feraheme.  [facility] and underwent EGD and colonoscopy for iron deficiency anemia.Colonoscopy showed nonbleeding internal hemorrhoids. Normal mucosa in the colon. No polyps EGD showed normal esophagus and gastric mucosal atrophy. No stigmata of bleeding   Interval history-neck swelling has improved after he restarted half dose of hydrochlorothiazide.  Denies any complaints at this time  ECOG PS- 1 Pain scale- 0  Review of systems- Review of Systems  Constitutional: Negative for chills, fever, malaise/fatigue and weight loss.  HENT: Negative for congestion, ear discharge and nosebleeds.   Eyes: Negative for blurred vision.  Respiratory: Negative for cough, hemoptysis, sputum production, shortness of breath and wheezing.   Cardiovascular: Negative for chest pain, palpitations, orthopnea and claudication.  Gastrointestinal: Negative for abdominal pain, blood in stool, constipation, diarrhea, heartburn, melena, nausea and vomiting.  Genitourinary: Negative for dysuria, flank pain, frequency, hematuria and urgency.  Musculoskeletal: Negative for back pain, joint pain and myalgias.  Skin: Negative for rash.  Neurological: Negative for dizziness, tingling, focal weakness, seizures, weakness and headaches.  Endo/Heme/Allergies: Does not bruise/bleed easily.  Psychiatric/Behavioral: Negative for depression and suicidal ideas. The patient does not have insomnia.        No Known Allergies   Past Medical History:  Diagnosis Date  . Anemia   . CMML (chronic myelomonocytic leukemia) ([place])   . Dyspnea   . Hypertension      Past Surgical History:  Procedure Laterality Date  . COLONOSCOPY N/A 08/28/2019  Procedure: COLONOSCOPY;  Surgeon: Toledo, Benay Pike,  MD;  Location: [facility] ENDOSCOPY;  Service: Gastroenterology;  Laterality: N/A;  . COLONOSCOPY WITH PROPOFOL N/A 05/03/2015   Procedure: COLONOSCOPY WITH PROPOFOL;  Surgeon: [name], MD;  Location[facility] ENDOSCOPY;  Service: Gastroenterology;  Laterality: N/A;  . ESOPHAGOGASTRODUODENOSCOPY N/A 08/28/2019   Procedure: ESOPHAGOGASTRODUODENOSCOPY (EGD);  Surgeon: Toledo, Benay Pike, MD;  Location: [facility] ENDOSCOPY;  Service: Gastroenterology;  Laterality: N/A;  . ESOPHAGOGASTRODUODENOSCOPY (EGD) WITH PROPOFOL N/A 05/03/2015   Procedure: ESOPHAGOGASTRODUODENOSCOPY (EGD) WITH PROPOFOL;  Surgeon: [name], MD;  Location: [facility] ENDOSCOPY;  Service: Gastroenterology;  Laterality: N/A;  . HERNIA REPAIR     1975    Social History   Socioeconomic History  . Marital status: Divorced    Spouse name: Not on file  . Number of children: 4  . Years of education: Not on file  . Highest education level: Not on file  Occupational History  . Occupation: retired    Comment: [employer]  Tobacco Use  . Smoking status: Never Smoker  . Smokeless tobacco: Never Used  Vaping Use  . Vaping Use: Never used  Substance and Sexual Activity  . Alcohol use: No  . Drug use: No  . Sexual activity: Not on file  Other Topics Concern  . Not on file  Social History Narrative  . Not on file   Social Determinants of Health   Financial Resource Strain:   . Difficulty of Paying Living Expenses: Not on file  Food Insecurity:   . Worried About Charity fundraiser in the Last Year: Not on file  . Ran Out of Food in the Last Year: Not on file  Transportation Needs:   . Lack of Transportation (Medical): Not on file  . Lack of Transportation (Non-Medical): Not on file  Physical Activity:   . Days of Exercise per Week: Not on file  . Minutes of Exercise per Session: Not on file  Stress:   . Feeling of Stress : Not on file  Social Connections:   . Frequency of Communication with Friends and Family: Not on file    . Frequency of Social Gatherings with Friends and Family: Not on file  . Attends Religious Services: Not on file  . Active Member of Clubs or Organizations: Not on file  . Attends Archivist Meetings: Not on file  . Marital Status: Not on file  Intimate Partner Violence:   . Fear of Current or Ex-Partner: Not on file  . Emotionally Abused: Not on file  . Physically Abused: Not on file  . Sexually Abused: Not on file    Family History  Problem Relation Age of Onset  . Alzheimer's disease Father   . Lung cancer Sister   . Cancer Sister      Current Outpatient Medications:  .  allopurinol (ZYLOPRIM) 300 MG tablet, TAKE 1 TABLET BY MOUTH EVERY DAY, Disp: 30 tablet, Rfl: 3 .  aspirin EC 81 MG tablet, Take 81 mg by mouth daily., Disp: , Rfl:  .  Cyanocobalamin (B-12 PO), Take 3,000 mcg by mouth daily., Disp: , Rfl:  .  hydrochlorothiazide (MICROZIDE) 12.5 MG capsule, Take 1 capsule (12.5 mg total) by mouth daily., Disp: 30 capsule, Rfl: 1 .  KLOR-CON M20 20 MEQ tablet, TAKE 1 TABLET BY MOUTH TWICE A DAY, Disp: 60 tablet, Rfl: 0 .  pantoprazole (PROTONIX) 40 MG tablet, Take 1 tablet (40 mg total) by mouth daily., Disp: 90 tablet, Rfl:  1 .  valACYclovir (VALTREX) 500 MG tablet, TAKE 1 TABLET BY MOUTH EVERY DAY, Disp: 90 tablet, Rfl: 1  Physical exam:  Vitals:   12/22/19 1321  BP: 137/76  Pulse: 90  Resp: 16  Temp: (!) 97.1 F (36.2 C)  TempSrc: Tympanic  SpO2: 100%  Weight: (!) 316 lb 1.6 oz (143.4 kg)   Physical Exam HENT:     Head: Normocephalic and atraumatic.  Eyes:     Pupils: Pupils are equal, round, and reactive to light.  Cardiovascular:     Rate and Rhythm: Normal rate and regular rhythm.     Heart sounds: Normal heart sounds.  Pulmonary:     Effort: Pulmonary effort is normal.     Breath sounds: Normal breath sounds.  Abdominal:     General: Bowel sounds are normal.     Palpations: Abdomen is soft.  Musculoskeletal:     Cervical back: Normal range  of motion.     Comments: Trace bilateral pedal edema  Skin:    General: Skin is warm and dry.  Neurological:     Mental Status: He is alert and oriented to person, place, and time.      CMP Latest Ref Rng & Units 12/22/2019  Glucose 70 - 99 mg/dL 103(H)  BUN 8 - 23 mg/dL 10  Creatinine 0.61 - 1.24 mg/dL 0.87  Sodium 135 - 145 mmol/L 138  Potassium 3.5 - 5.1 mmol/L 3.8  Chloride 98 - 111 mmol/L 105  CO2 22 - 32 mmol/L 28  Calcium 8.9 - 10.3 mg/dL 8.8(L)  Total Protein 6.5 - 8.1 g/dL 7.6  Total Bilirubin 0.3 - 1.2 mg/dL 0.8  Alkaline Phos 38 - 126 U/L 73  AST 15 - 41 U/L 13(L)  ALT 0 - 44 U/L 10   CBC Latest Ref Rng & Units 12/22/2019  WBC 4.0 - 10.5 K/uL 8.2  Hemoglobin 13.0 - 17.0 g/dL 11.6(L)  Hematocrit 39 - 52 % 38.7(L)  Platelets 150 - 400 K/uL 707(H)     Assessment and plan- Patient is a 79 y.o. male with history of CMML 1 who is currently getting Vidaza 5 days every month.  He is here for on treatment assessment prior to next cycle of Vidaza  Counts okay to proceed with next cycle of Vidaza today and for the next 4 days.  Hemoglobin is improved after he received 2 doses of Feraheme.  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  I will see him back in 4 weeks for cycle 17 of Vidaza  Visit Diagnosis 1. 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 Encounter for antineoplastic chemotherapy      Dr. Randa Evens, MD, MPH [facility] at [facility] [phone] 12/23/2019 8:40 AM

## 2019-12-24 ENCOUNTER — Inpatient Hospital Stay: Payer: Medicare Other

## 2019-12-24 ENCOUNTER — Other Ambulatory Visit: Payer: Self-pay

## 2019-12-24 VITALS — BP 115/68 | HR 99 | Temp 98.1°F | Resp 17

## 2019-12-24 DIAGNOSIS — C9310 Chronic myelomonocytic leukemia not having achieved remission: Secondary | ICD-10-CM | POA: Diagnosis not present

## 2019-12-24 DIAGNOSIS — Z5111 Encounter for antineoplastic chemotherapy: Secondary | ICD-10-CM | POA: Diagnosis not present

## 2019-12-24 MED ORDER — ONDANSETRON HCL 4 MG PO TABS
8.0000 mg | ORAL_TABLET | Freq: Once | ORAL | Status: AC
  Administered 2019-12-24: 8 mg via ORAL
  Filled 2019-12-24: qty 2

## 2019-12-24 MED ORDER — AZACITIDINE CHEMO SQ INJECTION
50.0000 mg/m2 | Freq: Once | INTRAMUSCULAR | Status: AC
  Administered 2019-12-24: 132.5 mg via SUBCUTANEOUS
  Filled 2019-12-24: qty 5.3

## 2019-12-25 ENCOUNTER — Inpatient Hospital Stay: Payer: Medicare Other

## 2019-12-25 VITALS — BP 119/72 | HR 99 | Temp 99.0°F | Resp 16

## 2019-12-25 DIAGNOSIS — Z5111 Encounter for antineoplastic chemotherapy: Secondary | ICD-10-CM | POA: Diagnosis not present

## 2019-12-25 DIAGNOSIS — C9310 Chronic myelomonocytic leukemia not having achieved remission: Secondary | ICD-10-CM

## 2019-12-25 MED ORDER — ONDANSETRON HCL 4 MG PO TABS
8.0000 mg | ORAL_TABLET | Freq: Once | ORAL | Status: AC
  Administered 2019-12-25: 8 mg via ORAL
  Filled 2019-12-25: qty 2

## 2019-12-25 MED ORDER — AZACITIDINE CHEMO SQ INJECTION
50.0000 mg/m2 | Freq: Once | INTRAMUSCULAR | Status: AC
  Administered 2019-12-25: 132.5 mg via SUBCUTANEOUS
  Filled 2019-12-25: qty 5.3

## 2019-12-26 ENCOUNTER — Other Ambulatory Visit: Payer: Self-pay

## 2019-12-26 ENCOUNTER — Inpatient Hospital Stay: Payer: Medicare Other

## 2019-12-26 VITALS — BP 123/73 | HR 98 | Temp 98.7°F | Resp 17

## 2019-12-26 DIAGNOSIS — C9310 Chronic myelomonocytic leukemia not having achieved remission: Secondary | ICD-10-CM

## 2019-12-26 DIAGNOSIS — Z5111 Encounter for antineoplastic chemotherapy: Secondary | ICD-10-CM | POA: Diagnosis not present

## 2019-12-26 MED ORDER — ONDANSETRON HCL 4 MG PO TABS
8.0000 mg | ORAL_TABLET | Freq: Once | ORAL | Status: AC
  Administered 2019-12-26: 8 mg via ORAL
  Filled 2019-12-26: qty 2

## 2019-12-26 MED ORDER — AZACITIDINE CHEMO SQ INJECTION
50.0000 mg/m2 | Freq: Once | INTRAMUSCULAR | Status: AC
  Administered 2019-12-26: 132.5 mg via SUBCUTANEOUS
  Filled 2019-12-26: qty 5.3

## 2020-01-11 ENCOUNTER — Other Ambulatory Visit: Payer: Self-pay | Admitting: Oncology

## 2020-01-19 ENCOUNTER — Inpatient Hospital Stay (HOSPITAL_BASED_OUTPATIENT_CLINIC_OR_DEPARTMENT_OTHER): Payer: Medicare Other | Admitting: Oncology

## 2020-01-19 ENCOUNTER — Inpatient Hospital Stay: Payer: Medicare Other

## 2020-01-19 ENCOUNTER — Encounter: Payer: Self-pay | Admitting: Oncology

## 2020-01-19 ENCOUNTER — Inpatient Hospital Stay: Payer: Medicare Other | Attending: Oncology

## 2020-01-19 VITALS — BP 128/83 | HR 88 | Temp 96.9°F | Resp 16 | Wt 316.6 lb

## 2020-01-19 DIAGNOSIS — C9310 Chronic myelomonocytic leukemia not having achieved remission: Secondary | ICD-10-CM

## 2020-01-19 DIAGNOSIS — D509 Iron deficiency anemia, unspecified: Secondary | ICD-10-CM | POA: Diagnosis not present

## 2020-01-19 DIAGNOSIS — Z5111 Encounter for antineoplastic chemotherapy: Secondary | ICD-10-CM

## 2020-01-19 LAB — CBC WITH DIFFERENTIAL/PLATELET
Abs Immature Granulocytes: 0.12 10*3/uL — ABNORMAL HIGH (ref 0.00–0.07)
Basophils Absolute: 0.2 10*3/uL — ABNORMAL HIGH (ref 0.0–0.1)
Basophils Relative: 2 %
Eosinophils Absolute: 0.1 10*3/uL (ref 0.0–0.5)
Eosinophils Relative: 1 %
HCT: 41.2 % (ref 39.0–52.0)
Hemoglobin: 12.8 g/dL — ABNORMAL LOW (ref 13.0–17.0)
Immature Granulocytes: 1 %
Lymphocytes Relative: 19 %
Lymphs Abs: 1.7 10*3/uL (ref 0.7–4.0)
MCH: 24.5 pg — ABNORMAL LOW (ref 26.0–34.0)
MCHC: 31.1 g/dL (ref 30.0–36.0)
MCV: 78.8 fL — ABNORMAL LOW (ref 80.0–100.0)
Monocytes Absolute: 0.6 10*3/uL (ref 0.1–1.0)
Monocytes Relative: 6 %
Neutro Abs: 6.4 10*3/uL (ref 1.7–7.7)
Neutrophils Relative %: 71 %
Platelets: 246 10*3/uL (ref 150–400)
RBC: 5.23 MIL/uL (ref 4.22–5.81)
RDW: 28.1 % — ABNORMAL HIGH (ref 11.5–15.5)
Smear Review: ADEQUATE
WBC: 9 10*3/uL (ref 4.0–10.5)
nRBC: 0.3 % — ABNORMAL HIGH (ref 0.0–0.2)

## 2020-01-19 LAB — COMPREHENSIVE METABOLIC PANEL
ALT: 11 U/L (ref 0–44)
AST: 14 U/L — ABNORMAL LOW (ref 15–41)
Albumin: 4.1 g/dL (ref 3.5–5.0)
Alkaline Phosphatase: 72 U/L (ref 38–126)
Anion gap: 9 (ref 5–15)
BUN: 13 mg/dL (ref 8–23)
CO2: 25 mmol/L (ref 22–32)
Calcium: 9.2 mg/dL (ref 8.9–10.3)
Chloride: 100 mmol/L (ref 98–111)
Creatinine, Ser: 0.86 mg/dL (ref 0.61–1.24)
GFR, Estimated: >60 mL/min (ref >60)
Glucose, Bld: 98 mg/dL (ref 70–99)
Potassium: 3.8 mmol/L (ref 3.5–5.1)
Sodium: 134 mmol/L — ABNORMAL LOW (ref 135–145)
Total Bilirubin: 0.7 mg/dL (ref 0.3–1.2)
Total Protein: 8.2 g/dL — ABNORMAL HIGH (ref 6.5–8.1)

## 2020-01-19 MED ORDER — AZACITIDINE CHEMO SQ INJECTION
50.0000 mg/m2 | Freq: Once | INTRAMUSCULAR | Status: AC
  Administered 2020-01-19: 132.5 mg via SUBCUTANEOUS
  Filled 2020-01-19: qty 5.3

## 2020-01-19 MED ORDER — ONDANSETRON HCL 4 MG PO TABS
8.0000 mg | ORAL_TABLET | Freq: Once | ORAL | Status: AC
  Administered 2020-01-19: 8 mg via ORAL
  Filled 2020-01-19: qty 2

## 2020-01-19 NOTE — Progress Notes (Signed)
Hematology/Oncology Consult note [facility]  [phone] [phone]  Patient Care Team: Lavera Guise, MD as PCP - General (Internal Medicine) Sindy Guadeloupe, MD as Consulting Physician (Oncology)   Name of the patient: Samuel Frank  [id number]  Jan 09, 1941   Date of visit: 01/19/20  Diagnosis- CMML-1  Chief complaint/ Reason for visit-on treatment assessment prior to next cycle of Vidaza  Heme/Onc history: Patient is a 79 yr old male with no significant co morbidities other than hypertension. He has recently been having bilateral knee pain and has undergone steroid injection in his knee.Patient was noted to have a high white count on his routine exam. His white count was 19.2 on 07/22/2018 with an H&H of 11.4/35.1 and a platelet count of 470. At that point he was prescribed a course of antibiotics and a repeat blood work on 08/21/2018 showed white count of 42.3, H&H of 7.1/23 with an MCV of 84 and a platelet count of 791 and hence the patient was referred to hematology for further management.  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 In addition the core biopsy showed a small focus of fibrosis containing scattering of mast cells and eosinophils most suggestive of systemic mastocytosis.bone marrow blasts 7%.Flow cytometry showed increased number of monocytic cells representing 16% of all cells with expression for HLA-DR, CD11b, CD13, CD14, CD33, CD38, CD56 and CD56 and CD64 with [facility] CD 117 are CD34.normal cytogenetics, CBL, SRSF2, SH2B3 and TET2  Repeat bone marrow biopsy after 4 cycles of Vidaza showed less pronounced monocytic/granulocytic proliferation and overall better granulopoiesis and no increase in blasts. In addition features of systemic mastocytosis not present.  After 9 cycles of Vidaza patient was noted to have significant anemia and worsening thrombocytosis for which  a repeat bone marrow biopsy was done. Bone marrow did not show any features of progression of CMML. Overall stable findings and no increased blast.He was found to have iron deficiency and received 2 doses of Feraheme.  [facility] and underwent EGD and colonoscopy for iron deficiency anemia.Colonoscopy showed nonbleeding internal hemorrhoids. Normal mucosa in the colon. No polyps EGD showed normal esophagus and gastric mucosal atrophy. No stigmata of bleeding   Interval history-patient is doing well and denies any complaints presently.  Leg swelling waxes and wanes and presently has no significant leg swelling  ECOG PS- 1 Pain scale- 0   Review of systems- Review of Systems  Constitutional: Negative for chills, fever, malaise/fatigue and weight loss.  HENT: Negative for congestion, ear discharge and nosebleeds.   Eyes: Negative for blurred vision.  Respiratory: Negative for cough, hemoptysis, sputum production, shortness of breath and wheezing.   Cardiovascular: Negative for chest pain, palpitations, orthopnea and claudication.  Gastrointestinal: Negative for abdominal pain, blood in stool, constipation, diarrhea, heartburn, melena, nausea and vomiting.  Genitourinary: Negative for dysuria, flank pain, frequency, hematuria and urgency.  Musculoskeletal: Negative for back pain, joint pain and myalgias.  Skin: Negative for rash.  Neurological: Negative for dizziness, tingling, focal weakness, seizures, weakness and headaches.  Endo/Heme/Allergies: Does not bruise/bleed easily.  Psychiatric/Behavioral: Negative for depression and suicidal ideas. The patient does not have insomnia.       No Known Allergies   Past Medical History:  Diagnosis Date  . Anemia   . CMML (chronic myelomonocytic leukemia) ([place])   . Dyspnea   . Hypertension      Past Surgical History:  Procedure Laterality Date  .  COLONOSCOPY N/A 08/28/2019   Procedure: COLONOSCOPY;  Surgeon:  Toledo, Benay Pike, MD;  Location: [facility] ENDOSCOPY;  Service: Gastroenterology;  Laterality: N/A;  . COLONOSCOPY WITH PROPOFOL N/A 05/03/2015   Procedure: COLONOSCOPY WITH PROPOFOL;  Surgeon: [name], MD;  Location: [facility] ENDOSCOPY;  Service: Gastroenterology;  Laterality: N/A;  . ESOPHAGOGASTRODUODENOSCOPY N/A 08/28/2019   Procedure: ESOPHAGOGASTRODUODENOSCOPY (EGD);  Surgeon: Toledo, Benay Pike, MD;  Location: [facility] ENDOSCOPY;  Service: Gastroenterology;  Laterality: N/A;  . ESOPHAGOGASTRODUODENOSCOPY (EGD) WITH PROPOFOL N/A 05/03/2015   Procedure: ESOPHAGOGASTRODUODENOSCOPY (EGD) WITH PROPOFOL;  Surgeon: [name], MD;  Location: [facility] ENDOSCOPY;  Service: Gastroenterology;  Laterality: N/A;  . HERNIA REPAIR     1975    Social History   Socioeconomic History  . Marital status: Divorced    Spouse name: Not on file  . Number of children: 4  . Years of education: Not on file  . Highest education level: Not on file  Occupational History  . Occupation: retired    Comment: [employer]  Tobacco Use  . Smoking status: Never Smoker  . Smokeless tobacco: Never Used  Vaping Use  . Vaping Use: Never used  Substance and Sexual Activity  . Alcohol use: No  . Drug use: No  . Sexual activity: Not on file  Other Topics Concern  . Not on file  Social History Narrative  . Not on file   Social Determinants of Health   Financial Resource Strain:   . Difficulty of Paying Living Expenses: Not on file  Food Insecurity:   . Worried About Charity fundraiser in the Last Year: Not on file  . Ran Out of Food in the Last Year: Not on file  Transportation Needs:   . Lack of Transportation (Medical): Not on file  . Lack of Transportation (Non-Medical): Not on file  Physical Activity:   . Days of Exercise per Week: Not on file  . Minutes of Exercise per Session: Not on file  Stress:   . Feeling of Stress : Not on file  Social Connections:   . Frequency of Communication with Friends and  Family: Not on file  . Frequency of Social Gatherings with Friends and Family: Not on file  . Attends Religious Services: Not on file  . Active Member of Clubs or Organizations: Not on file  . Attends Archivist Meetings: Not on file  . Marital Status: Not on file  Intimate Partner Violence:   . Fear of Current or Ex-Partner: Not on file  . Emotionally Abused: Not on file  . Physically Abused: Not on file  . Sexually Abused: Not on file    Family History  Problem Relation Age of Onset  . Alzheimer's disease Father   . Lung cancer Sister   . Cancer Sister      Current Outpatient Medications:  .  allopurinol (ZYLOPRIM) 300 MG tablet, TAKE 1 TABLET BY MOUTH EVERY DAY, Disp: 30 tablet, Rfl: 3 .  aspirin EC 81 MG tablet, Take 81 mg by mouth daily., Disp: , Rfl:  .  Cyanocobalamin (B-12 PO), Take 3,000 mcg by mouth daily., Disp: , Rfl:  .  hydrochlorothiazide (MICROZIDE) 12.5 MG capsule, Take 1 capsule (12.5 mg total) by mouth daily., Disp: 30 capsule, Rfl: 1 .  KLOR-CON M20 20 MEQ tablet, TAKE 1 TABLET BY MOUTH TWICE A DAY, Disp: 60 tablet, Rfl: 0 .  pantoprazole (PROTONIX) 40 MG tablet, Take 1 tablet (40 mg total) by mouth daily.,  Disp: 90 tablet, Rfl: 1 .  valACYclovir (VALTREX) 500 MG tablet, TAKE 1 TABLET BY MOUTH EVERY DAY, Disp: 90 tablet, Rfl: 1  Physical exam:  Vitals:   01/19/20 1309  BP: 128/83  Pulse: 88  Resp: 16  Temp: (!) 96.9 F (36.1 C)  TempSrc: Tympanic  SpO2: 98%  Weight: (!) 316 lb 9.6 oz (143.6 kg)   Physical Exam Constitutional:      General: He is not in acute distress. Cardiovascular:     Rate and Rhythm: Normal rate and regular rhythm.     Heart sounds: Normal heart sounds.  Pulmonary:     Effort: Pulmonary effort is normal.     Breath sounds: Normal breath sounds.  Abdominal:     General: Bowel sounds are normal.     Palpations: Abdomen is soft.  Skin:    General: Skin is warm and dry.  Neurological:     Mental Status: He is  alert and oriented to person, place, and time.      CMP Latest Ref Rng & Units 01/19/2020  Glucose 70 - 99 mg/dL 98  BUN 8 - 23 mg/dL 13  Creatinine 0.61 - 1.24 mg/dL 0.86  Sodium 135 - 145 mmol/L 134(L)  Potassium 3.5 - 5.1 mmol/L 3.8  Chloride 98 - 111 mmol/L 100  CO2 22 - 32 mmol/L 25  Calcium 8.9 - 10.3 mg/dL 9.2  Total Protein 6.5 - 8.1 g/dL 8.2(H)  Total Bilirubin 0.3 - 1.2 mg/dL 0.7  Alkaline Phos 38 - 126 U/L 72  AST 15 - 41 U/L 14(L)  ALT 0 - 44 U/L 11   CBC Latest Ref Rng & Units 01/19/2020  WBC 4.0 - 10.5 K/uL 9.0  Hemoglobin 13.0 - 17.0 g/dL 12.8(L)  Hematocrit 39 - 52 % 41.2  Platelets 150 - 400 K/uL 246     Assessment and plan- Patient is a 79 y.o. male with CMML 1 here for on treatment assessment prior to next cycle of Vidaza  White count is normal today and hemoglobin is improved to 12.8.  Platelet counts are normal.  He can proceed with Vidaza day 1 to day 5 starting today.  He will need to continue that until progression or toxicity.  Symptomatically he is doing well without any significant side effects.  I will see him back in 4 weeks prior to cycle 18 of Vidaza   Visit Diagnosis 1. Encounter for antineoplastic chemotherapy   2. 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 Randa Evens, MD, MPH [facility] [phone] 01/19/2020 1:09 PM

## 2020-01-19 NOTE — Progress Notes (Signed)
1410- Patient tolerated treatment well. Patient discharged to home at this time.

## 2020-01-19 NOTE — Addendum Note (Signed)
Addended by: Kern Alberta on: 01/19/2020 02:22 PM   Modules accepted: Orders

## 2020-01-20 ENCOUNTER — Other Ambulatory Visit: Payer: Self-pay

## 2020-01-20 ENCOUNTER — Inpatient Hospital Stay: Payer: Medicare Other

## 2020-01-20 VITALS — BP 118/65 | HR 109 | Temp 97.6°F | Resp 18

## 2020-01-20 DIAGNOSIS — C9310 Chronic myelomonocytic leukemia not having achieved remission: Secondary | ICD-10-CM | POA: Diagnosis not present

## 2020-01-20 DIAGNOSIS — Z5111 Encounter for antineoplastic chemotherapy: Secondary | ICD-10-CM | POA: Diagnosis not present

## 2020-01-20 MED ORDER — ONDANSETRON HCL 4 MG PO TABS
8.0000 mg | ORAL_TABLET | Freq: Once | ORAL | Status: AC
  Administered 2020-01-20: 8 mg via ORAL
  Filled 2020-01-20: qty 2

## 2020-01-20 MED ORDER — AZACITIDINE CHEMO SQ INJECTION
50.0000 mg/m2 | Freq: Once | INTRAMUSCULAR | Status: AC
  Administered 2020-01-20: 132.5 mg via SUBCUTANEOUS
  Filled 2020-01-20: qty 5.3

## 2020-01-20 NOTE — Progress Notes (Signed)
Patient tolerated chem injections well, no concerns voiced. Patient discharged. Stable.

## 2020-01-21 ENCOUNTER — Inpatient Hospital Stay: Payer: Medicare Other

## 2020-01-21 VITALS — BP 133/76 | HR 105 | Temp 99.1°F | Resp 20

## 2020-01-21 DIAGNOSIS — C9310 Chronic myelomonocytic leukemia not having achieved remission: Secondary | ICD-10-CM

## 2020-01-21 DIAGNOSIS — Z5111 Encounter for antineoplastic chemotherapy: Secondary | ICD-10-CM | POA: Diagnosis not present

## 2020-01-21 MED ORDER — AZACITIDINE CHEMO SQ INJECTION
50.0000 mg/m2 | Freq: Once | INTRAMUSCULAR | Status: AC
  Administered 2020-01-21: 132.5 mg via SUBCUTANEOUS
  Filled 2020-01-21: qty 5.3

## 2020-01-21 MED ORDER — ONDANSETRON HCL 4 MG PO TABS
8.0000 mg | ORAL_TABLET | Freq: Once | ORAL | Status: AC
  Administered 2020-01-21: 8 mg via ORAL
  Filled 2020-01-21: qty 2

## 2020-01-21 NOTE — Progress Notes (Signed)
Patient tolerated injections well. Discharged home.

## 2020-01-22 ENCOUNTER — Inpatient Hospital Stay: Payer: Medicare Other

## 2020-01-22 VITALS — BP 112/67 | HR 105 | Temp 98.8°F | Resp 20

## 2020-01-22 DIAGNOSIS — C9310 Chronic myelomonocytic leukemia not having achieved remission: Secondary | ICD-10-CM | POA: Diagnosis not present

## 2020-01-22 DIAGNOSIS — Z5111 Encounter for antineoplastic chemotherapy: Secondary | ICD-10-CM | POA: Diagnosis not present

## 2020-01-22 MED ORDER — AZACITIDINE CHEMO SQ INJECTION
50.0000 mg/m2 | Freq: Once | INTRAMUSCULAR | Status: AC
  Administered 2020-01-22: 132.5 mg via SUBCUTANEOUS
  Filled 2020-01-22: qty 5.3

## 2020-01-22 MED ORDER — ONDANSETRON HCL 4 MG PO TABS
8.0000 mg | ORAL_TABLET | Freq: Once | ORAL | Status: AC
  Administered 2020-01-22: 8 mg via ORAL
  Filled 2020-01-22: qty 2

## 2020-01-23 ENCOUNTER — Inpatient Hospital Stay: Payer: Medicare Other

## 2020-01-23 ENCOUNTER — Other Ambulatory Visit: Payer: Self-pay | Admitting: Oncology

## 2020-01-23 VITALS — BP 125/75 | HR 96 | Temp 96.6°F | Resp 20

## 2020-01-23 DIAGNOSIS — C9310 Chronic myelomonocytic leukemia not having achieved remission: Secondary | ICD-10-CM

## 2020-01-23 DIAGNOSIS — Z5111 Encounter for antineoplastic chemotherapy: Secondary | ICD-10-CM | POA: Diagnosis not present

## 2020-01-23 MED ORDER — ONDANSETRON HCL 4 MG PO TABS
8.0000 mg | ORAL_TABLET | Freq: Once | ORAL | Status: AC
  Administered 2020-01-23: 8 mg via ORAL
  Filled 2020-01-23: qty 2

## 2020-01-23 MED ORDER — AZACITIDINE CHEMO SQ INJECTION
50.0000 mg/m2 | Freq: Once | INTRAMUSCULAR | Status: AC
  Administered 2020-01-23: 132.5 mg via SUBCUTANEOUS
  Filled 2020-01-23: qty 5.3

## 2020-01-23 NOTE — Progress Notes (Signed)
1416- Patient tolerated treatment well. Patient discharged to home at this time.

## 2020-01-24 ENCOUNTER — Other Ambulatory Visit: Payer: Self-pay | Admitting: Oncology

## 2020-02-04 ENCOUNTER — Other Ambulatory Visit: Payer: Self-pay | Admitting: Oncology

## 2020-02-11 ENCOUNTER — Other Ambulatory Visit: Payer: Self-pay | Admitting: Oncology

## 2020-02-16 ENCOUNTER — Encounter: Payer: Self-pay | Admitting: Oncology

## 2020-02-16 ENCOUNTER — Inpatient Hospital Stay: Payer: Medicare Other

## 2020-02-16 ENCOUNTER — Inpatient Hospital Stay (HOSPITAL_BASED_OUTPATIENT_CLINIC_OR_DEPARTMENT_OTHER): Payer: Medicare Other | Admitting: Oncology

## 2020-02-16 ENCOUNTER — Inpatient Hospital Stay: Payer: Medicare Other | Attending: Oncology

## 2020-02-16 VITALS — BP 126/77 | HR 92 | Temp 97.9°F | Resp 18 | Wt 321.0 lb

## 2020-02-16 DIAGNOSIS — C9310 Chronic myelomonocytic leukemia not having achieved remission: Secondary | ICD-10-CM

## 2020-02-16 DIAGNOSIS — Z5111 Encounter for antineoplastic chemotherapy: Secondary | ICD-10-CM | POA: Diagnosis not present

## 2020-02-16 LAB — COMPREHENSIVE METABOLIC PANEL
ALT: 9 U/L (ref 0–44)
AST: 12 U/L — ABNORMAL LOW (ref 15–41)
Albumin: 4.1 g/dL (ref 3.5–5.0)
Alkaline Phosphatase: 67 U/L (ref 38–126)
Anion gap: 9 (ref 5–15)
BUN: 10 mg/dL (ref 8–23)
CO2: 27 mmol/L (ref 22–32)
Calcium: 9.1 mg/dL (ref 8.9–10.3)
Chloride: 99 mmol/L (ref 98–111)
Creatinine, Ser: 0.83 mg/dL (ref 0.61–1.24)
GFR, Estimated: >60 mL/min (ref >60)
Glucose, Bld: 103 mg/dL — ABNORMAL HIGH (ref 70–99)
Potassium: 3.9 mmol/L (ref 3.5–5.1)
Sodium: 135 mmol/L (ref 135–145)
Total Bilirubin: 0.7 mg/dL (ref 0.3–1.2)
Total Protein: 7.6 g/dL (ref 6.5–8.1)

## 2020-02-16 LAB — CBC WITH DIFFERENTIAL/PLATELET
Abs Immature Granulocytes: 0.21 10*3/uL — ABNORMAL HIGH (ref 0.00–0.07)
Basophils Absolute: 0.2 10*3/uL — ABNORMAL HIGH (ref 0.0–0.1)
Basophils Relative: 2 %
Eosinophils Absolute: 0.1 10*3/uL (ref 0.0–0.5)
Eosinophils Relative: 1 %
HCT: 38.6 % — ABNORMAL LOW (ref 39.0–52.0)
Hemoglobin: 11.9 g/dL — ABNORMAL LOW (ref 13.0–17.0)
Immature Granulocytes: 2 %
Lymphocytes Relative: 16 %
Lymphs Abs: 1.6 10*3/uL (ref 0.7–4.0)
MCH: 24.7 pg — ABNORMAL LOW (ref 26.0–34.0)
MCHC: 30.8 g/dL (ref 30.0–36.0)
MCV: 80.1 fL (ref 80.0–100.0)
Monocytes Absolute: 0.9 10*3/uL (ref 0.1–1.0)
Monocytes Relative: 9 %
Neutro Abs: 6.8 10*3/uL (ref 1.7–7.7)
Neutrophils Relative %: 70 %
Platelets: 264 10*3/uL (ref 150–400)
RBC: 4.82 MIL/uL (ref 4.22–5.81)
RDW: 25.4 % — ABNORMAL HIGH (ref 11.5–15.5)
Smear Review: ADEQUATE
WBC: 9.7 10*3/uL (ref 4.0–10.5)
nRBC: 0 % (ref 0.0–0.2)

## 2020-02-16 MED ORDER — HYDROCHLOROTHIAZIDE 12.5 MG PO CAPS: 12.5000 mg | ORAL_CAPSULE | Freq: Every day | ORAL | 1 refills | Status: DC

## 2020-02-16 MED ORDER — AZACITIDINE CHEMO SQ INJECTION
50.0000 mg/m2 | Freq: Once | INTRAMUSCULAR | Status: AC
  Administered 2020-02-16: 14:00:00 132.5 mg via SUBCUTANEOUS
  Filled 2020-02-16: qty 5.3

## 2020-02-16 MED ORDER — ONDANSETRON HCL 4 MG PO TABS
8.0000 mg | ORAL_TABLET | Freq: Once | ORAL | Status: AC
  Administered 2020-02-16: 14:00:00 8 mg via ORAL
  Filled 2020-02-16: qty 2

## 2020-02-16 NOTE — Progress Notes (Signed)
Patient tolerated injections well. Discharged home.

## 2020-02-16 NOTE — Progress Notes (Signed)
Hematology/Oncology Consult note [facility]  Telephone:([phone] Fax:([phone]  Patient Care Team: Lavera Guise, MD as PCP - General (Internal Medicine) Sindy Guadeloupe, MD as Consulting Physician (Oncology)   Name of the patient: Samuel Frank  [id number]  1940/05/10   Date of visit: 02/16/20  Diagnosis-CMML 1  Chief complaint/ Reason for visit-on treatment assessment prior to next cycle of Vidaza  Heme/Onc history: Patient is a 79 yr old male with no significant co morbidities other than hypertension. He has recently been having bilateral knee pain and has undergone steroid injection in his knee.Patient was noted to have a high white count on his routine exam. His white count was 19.2 on 07/22/2018 with an H&H of 11.4/35.1 and a platelet count of 470. At that point he was prescribed a course of antibiotics and a repeat blood work on 08/21/2018 showed white count of 42.3, H&H of 7.1/23 with an MCV of 84 and a platelet count of 791 and hence the patient was referred to hematology for further management.  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 In addition the core biopsy showed a small focus of fibrosis containing scattering of mast cells and eosinophils most suggestive of systemic mastocytosis.bone marrow blasts 7%.Flow cytometry showed increased number of monocytic cells representing 16% of all cells with expression for HLA-DR, CD11b, CD13, CD14, CD33, CD38, CD56 and CD56 and CD64 with [facility] CD 117 are CD34.normal cytogenetics, CBL, SRSF2, SH2B3 and TET2  Repeat bone marrow biopsy after 4 cycles of Vidaza showed less pronounced monocytic/granulocytic proliferation and overall better granulopoiesis and no increase in blasts. In addition features of systemic mastocytosis not present.  After 9 cycles of Vidaza patient was noted to have significant anemia and worsening thrombocytosis for which  a repeat bone marrow biopsy was done. Bone marrow did not show any features of progression of CMML. Overall stable findings and no increased blast.He was found to have iron deficiency and received 2 doses of Feraheme.  [facility] and underwent EGD and colonoscopy for iron deficiency anemia.Colonoscopy showed nonbleeding internal hemorrhoids. Normal mucosa in the colon. No polyps EGD showed normal esophagus and gastric mucosal atrophy. No stigmata of bleeding   Interval history-patient reports doing well and denies any complaints at this time.  ECOG PS- 1 Pain scale- 0  Review of systems- Review of Systems  Constitutional: Negative for chills, fever, malaise/fatigue and weight loss.  HENT: Negative for congestion, ear discharge and nosebleeds.   Eyes: Negative for blurred vision.  Respiratory: Negative for cough, hemoptysis, sputum production, shortness of breath and wheezing.   Cardiovascular: Negative for chest pain, palpitations, orthopnea and claudication.  Gastrointestinal: Negative for abdominal pain, blood in stool, constipation, diarrhea, heartburn, melena, nausea and vomiting.  Genitourinary: Negative for dysuria, flank pain, frequency, hematuria and urgency.  Musculoskeletal: Negative for back pain, joint pain and myalgias.  Skin: Negative for rash.  Neurological: Negative for dizziness, tingling, focal weakness, seizures, weakness and headaches.  Endo/Heme/Allergies: Does not bruise/bleed easily.  Psychiatric/Behavioral: Negative for depression and suicidal ideas. The patient does not have insomnia.       No Known Allergies   Past Medical History:  Diagnosis Date  . Anemia   . CMML (chronic myelomonocytic leukemia) ([place])   . Dyspnea   . Hypertension      Past Surgical History:  Procedure Laterality Date  . COLONOSCOPY N/A 08/28/2019   Procedure: COLONOSCOPY;  Surgeon: Alto, Keene,  MD;  Location: [facility] ENDOSCOPY;  Service:  Gastroenterology;  Laterality: N/A;  . COLONOSCOPY WITH PROPOFOL N/A 05/03/2015   Procedure: COLONOSCOPY WITH PROPOFOL;  Surgeon: [name], MD;  Location: [facility] ENDOSCOPY;  Service: Gastroenterology;  Laterality: N/A;  . ESOPHAGOGASTRODUODENOSCOPY N/A 08/28/2019   Procedure: ESOPHAGOGASTRODUODENOSCOPY (EGD);  Surgeon: Toledo, Benay Pike, MD;  Location: [facility] ENDOSCOPY;  Service: Gastroenterology;  Laterality: N/A;  . ESOPHAGOGASTRODUODENOSCOPY (EGD) WITH PROPOFOL N/A 05/03/2015   Procedure: ESOPHAGOGASTRODUODENOSCOPY (EGD) WITH PROPOFOL;  Surgeon: [name], MD;  Location: [facility] ENDOSCOPY;  Service: Gastroenterology;  Laterality: N/A;  . HERNIA REPAIR     1975    Social History   Socioeconomic History  . Marital status: Divorced    Spouse name: Not on file  . Number of children: 4  . Years of education: Not on file  . Highest education level: Not on file  Occupational History  . Occupation: retired    Comment: [employer]  Tobacco Use  . Smoking status: Never Smoker  . Smokeless tobacco: Never Used  Vaping Use  . Vaping Use: Never used  Substance and Sexual Activity  . Alcohol use: No  . Drug use: No  . Sexual activity: Not Currently  Other Topics Concern  . Not on file  Social History Narrative  . Not on file   Social Determinants of Health   Financial Resource Strain: Not on file  Food Insecurity: Not on file  Transportation Needs: Not on file  Physical Activity: Not on file  Stress: Not on file  Social Connections: Not on file  Intimate Partner Violence: Not on file    Family History  Problem Relation Age of Onset  . Alzheimer's disease Father   . Lung cancer Sister   . Cancer Sister      Current Outpatient Medications:  .  allopurinol (ZYLOPRIM) 300 MG tablet, TAKE 1 TABLET BY MOUTH EVERY DAY, Disp: 30 tablet, Rfl: 3 .  aspirin EC 81 MG tablet, Take 81 mg by mouth daily., Disp: , Rfl:  .  Cyanocobalamin (B-12 PO), Take 3,000 mcg by mouth daily.,  Disp: , Rfl:  .  KLOR-CON M20 20 MEQ tablet, TAKE 1 TABLET BY MOUTH TWICE A DAY, Disp: 60 tablet, Rfl: 0 .  pantoprazole (PROTONIX) 40 MG tablet, Take 1 tablet (40 mg total) by mouth daily., Disp: 90 tablet, Rfl: 1 .  valACYclovir (VALTREX) 500 MG tablet, TAKE 1 TABLET BY MOUTH EVERY DAY, Disp: 90 tablet, Rfl: 1 .  hydrochlorothiazide (MICROZIDE) 12.5 MG capsule, Take 1 capsule (12.5 mg total) by mouth daily., Disp: 90 capsule, Rfl: 1  Physical exam:  Vitals:   02/16/20 1309  BP: 126/77  Pulse: 92  Resp: 18  Temp: 97.9 F (36.6 C)  TempSrc: Tympanic  SpO2: 99%  Weight: (!) 321 lb (145.6 kg)   Physical Exam Eyes:     Extraocular Movements: EOM normal.  Cardiovascular:     Rate and Rhythm: Normal rate and regular rhythm.     Heart sounds: Normal heart sounds.  Pulmonary:     Effort: Pulmonary effort is normal.     Breath sounds: Normal breath sounds.  Abdominal:     General: Bowel sounds are normal.     Palpations: Abdomen is soft.  Skin:    General: Skin is warm and dry.  Neurological:     Mental Status: He is alert and oriented to person, place, and time.      CMP Latest Ref Rng & Units  02/16/2020  Glucose 70 - 99 mg/dL 103(H)  BUN 8 - 23 mg/dL 10  Creatinine 0.61 - 1.24 mg/dL 0.83  Sodium 135 - 145 mmol/L 135  Potassium 3.5 - 5.1 mmol/L 3.9  Chloride 98 - 111 mmol/L 99  CO2 22 - 32 mmol/L 27  Calcium 8.9 - 10.3 mg/dL 9.1  Total Protein 6.5 - 8.1 g/dL 7.6  Total Bilirubin 0.3 - 1.2 mg/dL 0.7  Alkaline Phos 38 - 126 U/L 67  AST 15 - 41 U/L 12(L)  ALT 0 - 44 U/L 9   CBC Latest Ref Rng & Units 02/16/2020  WBC 4.0 - 10.5 K/uL 9.7  Hemoglobin 13.0 - 17.0 g/dL 11.9(L)  Hematocrit 39.0 - 52.0 % 38.6(L)  Platelets 150 - 400 K/uL 264      Assessment and plan- Patient is a 79 y.o. male with CMML 1 here for on treatment assessment prior to next cycle of Vidaza  Counts okay to proceed with Vidaza day 1 to day 5 today.  I will see him back in 2 weeks for cycle 19.   Plan is to continue Vidaza until progression or toxicity.  Overall he is tolerating treatment well without any significant side effects.  Patient does have intermittent anemia and most often he has iron deficiency in those times.  I will plan to check ferritin and iron studies with his next visit Visit Diagnosis 1. Encounter for antineoplastic chemotherapy   2. 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 Randa Evens, MD, MPH [facility] at [facility] [phone] 02/16/2020 1:53 PM

## 2020-02-17 ENCOUNTER — Other Ambulatory Visit: Payer: Self-pay

## 2020-02-17 ENCOUNTER — Inpatient Hospital Stay: Payer: Medicare Other

## 2020-02-17 VITALS — BP 111/69 | HR 93 | Temp 99.8°F | Resp 20

## 2020-02-17 DIAGNOSIS — C9310 Chronic myelomonocytic leukemia not having achieved remission: Secondary | ICD-10-CM

## 2020-02-17 DIAGNOSIS — Z5111 Encounter for antineoplastic chemotherapy: Secondary | ICD-10-CM | POA: Diagnosis not present

## 2020-02-17 MED ORDER — AZACITIDINE CHEMO SQ INJECTION
50.0000 mg/m2 | Freq: Once | INTRAMUSCULAR | Status: AC
  Administered 2020-02-17: 13:00:00 132.5 mg via SUBCUTANEOUS
  Filled 2020-02-17: qty 5.3

## 2020-02-17 MED ORDER — ONDANSETRON HCL 4 MG PO TABS
8.0000 mg | ORAL_TABLET | Freq: Once | ORAL | Status: AC
  Administered 2020-02-17: 13:00:00 8 mg via ORAL
  Filled 2020-02-17: qty 2

## 2020-02-17 NOTE — Progress Notes (Signed)
Patient tolerated injections well. Discharged home.

## 2020-02-18 ENCOUNTER — Inpatient Hospital Stay: Payer: Medicare Other

## 2020-02-18 VITALS — BP 129/64 | HR 98 | Temp 96.5°F | Resp 20

## 2020-02-18 DIAGNOSIS — C9310 Chronic myelomonocytic leukemia not having achieved remission: Secondary | ICD-10-CM | POA: Diagnosis not present

## 2020-02-18 DIAGNOSIS — Z5111 Encounter for antineoplastic chemotherapy: Secondary | ICD-10-CM | POA: Diagnosis not present

## 2020-02-18 MED ORDER — ONDANSETRON HCL 4 MG PO TABS
8.0000 mg | ORAL_TABLET | Freq: Once | ORAL | Status: AC
  Administered 2020-02-18: 13:00:00 8 mg via ORAL
  Filled 2020-02-18: qty 2

## 2020-02-18 MED ORDER — AZACITIDINE CHEMO SQ INJECTION
50.0000 mg/m2 | Freq: Once | INTRAMUSCULAR | Status: AC
  Administered 2020-02-18: 14:00:00 132.5 mg via SUBCUTANEOUS
  Filled 2020-02-18: qty 5.3

## 2020-02-18 NOTE — Progress Notes (Signed)
1345- Patient tolerated treatment well. Patient stable and discharged to home at this time.

## 2020-02-19 ENCOUNTER — Inpatient Hospital Stay: Payer: Medicare Other

## 2020-02-19 ENCOUNTER — Other Ambulatory Visit: Payer: Self-pay

## 2020-02-19 VITALS — BP 130/69 | HR 87 | Temp 98.5°F | Resp 16

## 2020-02-19 DIAGNOSIS — Z5111 Encounter for antineoplastic chemotherapy: Secondary | ICD-10-CM | POA: Diagnosis not present

## 2020-02-19 DIAGNOSIS — C9310 Chronic myelomonocytic leukemia not having achieved remission: Secondary | ICD-10-CM

## 2020-02-19 MED ORDER — AZACITIDINE CHEMO SQ INJECTION
50.0000 mg/m2 | Freq: Once | INTRAMUSCULAR | Status: AC
Start: 2020-02-19 — End: 2020-02-19
  Administered 2020-02-19: 14:00:00 132.5 mg via SUBCUTANEOUS
  Filled 2020-02-19: qty 5.3

## 2020-02-19 MED ORDER — ONDANSETRON HCL 4 MG PO TABS
8.0000 mg | ORAL_TABLET | Freq: Once | ORAL | Status: AC
  Administered 2020-02-19: 13:00:00 8 mg via ORAL
  Filled 2020-02-19: qty 2

## 2020-02-19 NOTE — Progress Notes (Signed)
SQ Vidaza well tolerated. Discharged home in stable condition.  

## 2020-02-20 ENCOUNTER — Inpatient Hospital Stay: Payer: Medicare Other

## 2020-02-20 VITALS — BP 119/68 | HR 99 | Temp 98.3°F | Resp 17

## 2020-02-20 DIAGNOSIS — Z5111 Encounter for antineoplastic chemotherapy: Secondary | ICD-10-CM | POA: Diagnosis not present

## 2020-02-20 DIAGNOSIS — C9310 Chronic myelomonocytic leukemia not having achieved remission: Secondary | ICD-10-CM | POA: Diagnosis not present

## 2020-02-20 MED ORDER — ONDANSETRON HCL 4 MG PO TABS
8.0000 mg | ORAL_TABLET | Freq: Once | ORAL | Status: AC
  Administered 2020-02-20: 13:00:00 8 mg via ORAL
  Filled 2020-02-20: qty 2

## 2020-02-20 MED ORDER — AZACITIDINE CHEMO SQ INJECTION
50.0000 mg/m2 | Freq: Once | INTRAMUSCULAR | Status: AC
  Administered 2020-02-20: 13:00:00 132.5 mg via SUBCUTANEOUS
  Filled 2020-02-20: qty 5.3

## 2020-02-20 NOTE — Progress Notes (Signed)
Pt tolerated treatment well. Pt stable at discharge.  

## 2020-02-26 DIAGNOSIS — M170 Bilateral primary osteoarthritis of knee: Secondary | ICD-10-CM | POA: Diagnosis not present

## 2020-02-26 DIAGNOSIS — M1711 Unilateral primary osteoarthritis, right knee: Secondary | ICD-10-CM | POA: Insufficient documentation

## 2020-03-01 ENCOUNTER — Other Ambulatory Visit: Payer: Self-pay | Admitting: Oncology

## 2020-03-01 NOTE — Telephone Encounter (Signed)
  Component Ref Range & Units 2 wk ago 1 mo ago 2 mo ago 3 mo ago 4 mo ago 5 mo ago 6 mo ago  Potassium 3.5 - 5.1 mmol/L 3.9  3.8  3.8  4.0  3.9  4.1  3.7

## 2020-03-05 ENCOUNTER — Other Ambulatory Visit: Payer: Self-pay | Admitting: Oncology

## 2020-03-09 ENCOUNTER — Other Ambulatory Visit: Payer: Self-pay

## 2020-03-09 DIAGNOSIS — K219 Gastro-esophageal reflux disease without esophagitis: Secondary | ICD-10-CM

## 2020-03-09 MED ORDER — PANTOPRAZOLE SODIUM 40 MG PO TBEC: 40.0000 mg | DELAYED_RELEASE_TABLET | Freq: Every day | ORAL | 1 refills | Status: DC

## 2020-03-15 ENCOUNTER — Inpatient Hospital Stay: Payer: Medicare Other

## 2020-03-15 ENCOUNTER — Inpatient Hospital Stay: Payer: Medicare Other | Attending: Oncology | Admitting: Oncology

## 2020-03-15 ENCOUNTER — Encounter: Payer: Self-pay | Admitting: Oncology

## 2020-03-15 VITALS — BP 127/79 | HR 87 | Temp 98.1°F | Resp 16 | Wt 319.0 lb

## 2020-03-15 DIAGNOSIS — C9310 Chronic myelomonocytic leukemia not having achieved remission: Secondary | ICD-10-CM | POA: Insufficient documentation

## 2020-03-15 DIAGNOSIS — Z79899 Other long term (current) drug therapy: Secondary | ICD-10-CM | POA: Insufficient documentation

## 2020-03-15 DIAGNOSIS — Z5111 Encounter for antineoplastic chemotherapy: Secondary | ICD-10-CM | POA: Diagnosis not present

## 2020-03-15 LAB — CBC WITH DIFFERENTIAL/PLATELET
Abs Immature Granulocytes: 0.23 10*3/uL — ABNORMAL HIGH (ref 0.00–0.07)
Basophils Absolute: 0.2 10*3/uL — ABNORMAL HIGH (ref 0.0–0.1)
Basophils Relative: 1 %
Eosinophils Absolute: 0.2 10*3/uL (ref 0.0–0.5)
Eosinophils Relative: 1 %
HCT: 40.9 % (ref 39.0–52.0)
Hemoglobin: 13 g/dL (ref 13.0–17.0)
Immature Granulocytes: 2 %
Lymphocytes Relative: 12 %
Lymphs Abs: 1.8 10*3/uL (ref 0.7–4.0)
MCH: 24.8 pg — ABNORMAL LOW (ref 26.0–34.0)
MCHC: 31.8 g/dL (ref 30.0–36.0)
MCV: 78.1 fL — ABNORMAL LOW (ref 80.0–100.0)
Monocytes Absolute: 0.9 10*3/uL (ref 0.1–1.0)
Monocytes Relative: 6 %
Neutro Abs: 11.3 10*3/uL — ABNORMAL HIGH (ref 1.7–7.7)
Neutrophils Relative %: 78 %
Platelets: 363 10*3/uL (ref 150–400)
RBC: 5.24 MIL/uL (ref 4.22–5.81)
RDW: 23.1 % — ABNORMAL HIGH (ref 11.5–15.5)
Smear Review: ADEQUATE
WBC: 14.6 10*3/uL — ABNORMAL HIGH (ref 4.0–10.5)
nRBC: 0.1 % (ref 0.0–0.2)

## 2020-03-15 LAB — COMPREHENSIVE METABOLIC PANEL
ALT: 8 U/L (ref 0–44)
AST: 11 U/L — ABNORMAL LOW (ref 15–41)
Albumin: 4.2 g/dL (ref 3.5–5.0)
Alkaline Phosphatase: 71 U/L (ref 38–126)
Anion gap: 8 (ref 5–15)
BUN: 14 mg/dL (ref 8–23)
CO2: 27 mmol/L (ref 22–32)
Calcium: 9.2 mg/dL (ref 8.9–10.3)
Chloride: 101 mmol/L (ref 98–111)
Creatinine, Ser: 0.99 mg/dL (ref 0.61–1.24)
GFR, Estimated: >60 mL/min (ref >60)
Glucose, Bld: 103 mg/dL — ABNORMAL HIGH (ref 70–99)
Potassium: 3.8 mmol/L (ref 3.5–5.1)
Sodium: 136 mmol/L (ref 135–145)
Total Bilirubin: 0.7 mg/dL (ref 0.3–1.2)
Total Protein: 8.1 g/dL (ref 6.5–8.1)

## 2020-03-15 LAB — FERRITIN: Ferritin: 12 ng/mL — ABNORMAL LOW (ref 24–336)

## 2020-03-15 MED ORDER — ONDANSETRON HCL 4 MG PO TABS
8.0000 mg | ORAL_TABLET | Freq: Once | ORAL | Status: AC
  Administered 2020-03-15: 8 mg via ORAL
  Filled 2020-03-15: qty 2

## 2020-03-15 MED ORDER — AZACITIDINE CHEMO SQ INJECTION
50.0000 mg/m2 | Freq: Once | INTRAMUSCULAR | Status: AC
  Administered 2020-03-15: 132.5 mg via SUBCUTANEOUS
  Filled 2020-03-15: qty 5.3

## 2020-03-15 NOTE — Progress Notes (Signed)
Hematology/Oncology Consult note [facility]  [phone] [phone]  Patient Care Team: Lavera Guise, MD as PCP - General (Internal Medicine) Sindy Guadeloupe, MD as Consulting Physician (Oncology)   Name of the patient: Samuel Frank  [id number]  08-Jul-1940   Date of visit: 03/15/20  Diagnosis-CMML 1  Chief complaint/ Reason for visit-on treatment assessment prior to next cycle of Vidaza  Heme/Onc history: Patient is a80yrold male with no significant co morbidities other than hypertension. He has recently been having bilateral knee pain and has undergone steroid injection in his knee.Patient was noted to have a high white count on his routine exam. His white count was 19.2 on 07/22/2018 with an H&H of 11.4/35.1 and a platelet count of 470. At that point he was prescribed a course of antibiotics and a repeat blood work on 08/21/2018 showed white count of 42.3, H&H of 7.1/23 with an MCV of 84 and a platelet count of 791 and hence the patient was referred to hematology for further management.  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 In addition the core biopsy showed a small focus of fibrosis containing scattering of mast cells and eosinophils most suggestive of systemic mastocytosis.bone marrow blasts 7%.Flow cytometry showed increased number of monocytic cells representing 16% of all cells with expression for HLA-DR, CD11b, CD13, CD14, CD33, CD38, CD56 and CD56 and CD64 with [facility] CD 117 are CD34.normal cytogenetics, CBL, SRSF2, SH2B3 and TET2  Repeat bone marrow biopsy after 4 cycles of Vidaza showed less pronounced monocytic/granulocytic proliferation and overall better granulopoiesis and no increase in blasts. In addition features of systemic mastocytosis not present.  After 9 cycles of Vidaza patient was noted to have significant anemia and worsening thrombocytosis for which  a repeat bone marrow biopsy was done. Bone marrow did not show any features of progression of CMML. Overall stable findings and no increased blast.He was found to have iron deficiency and received 2 doses of Feraheme.  [facility] and underwent EGD and colonoscopy for iron deficiency anemia.Colonoscopy showed nonbleeding internal hemorrhoids. Normal mucosa in the colon. No polyps EGD showed normal esophagus and gastric mucosal atrophy. No stigmata of bleeding  Interval history-patient is tolerating Vidaza well without any significant side effects.  He also follows up with rheumatology for his knee pain and recently received intra-articular injections with some benefit  ECOG PS- 1 Pain scale- 0 Opioid associated constipation- no  Review of systems- Review of Systems  Constitutional: Negative for chills, fever, malaise/fatigue and weight loss.  HENT: Negative for congestion, ear discharge and nosebleeds.   Eyes: Negative for blurred vision.  Respiratory: Negative for cough, hemoptysis, sputum production, shortness of breath and wheezing.   Cardiovascular: Negative for chest pain, palpitations, orthopnea and claudication.  Gastrointestinal: Negative for abdominal pain, blood in stool, constipation, diarrhea, heartburn, melena, nausea and vomiting.  Genitourinary: Negative for dysuria, flank pain, frequency, hematuria and urgency.  Musculoskeletal: Negative for back pain, joint pain and myalgias.  Skin: Negative for rash.  Neurological: Negative for dizziness, tingling, focal weakness, seizures, weakness and headaches.  Endo/Heme/Allergies: Does not bruise/bleed easily.  Psychiatric/Behavioral: Negative for depression and suicidal ideas. The patient does not have insomnia.       No Known Allergies   Past Medical History:  Diagnosis Date  . Anemia   . CMML (chronic myelomonocytic leukemia) ([place]   . Dyspnea   . Hypertension      Past Surgical  History:   Procedure Laterality Date  . COLONOSCOPY N/A 08/28/2019   Procedure: COLONOSCOPY;  Surgeon: Toledo, Benay Pike, MD;  Location: [facility] ENDOSCOPY;  Service: Gastroenterology;  Laterality: N/A;  . COLONOSCOPY WITH PROPOFOL N/A 05/03/2015   Procedure: COLONOSCOPY WITH PROPOFOL;  Surgeon: [name], MD;  Location: [facility] ENDOSCOPY;  Service: Gastroenterology;  Laterality: N/A;  . ESOPHAGOGASTRODUODENOSCOPY N/A 08/28/2019   Procedure: ESOPHAGOGASTRODUODENOSCOPY (EGD);  Surgeon: Toledo, Benay Pike, MD;  Location: [facility] ENDOSCOPY;  Service: Gastroenterology;  Laterality: N/A;  . ESOPHAGOGASTRODUODENOSCOPY (EGD) WITH PROPOFOL N/A 05/03/2015   Procedure: ESOPHAGOGASTRODUODENOSCOPY (EGD) WITH PROPOFOL;  Surgeon: [name], MD;  Location: [facility] ENDOSCOPY;  Service: Gastroenterology;  Laterality: N/A;  . HERNIA REPAIR     1975    Social History   Socioeconomic History  . Marital status: Divorced    Spouse name: Not on file  . Number of children: 4  . Years of education: Not on file  . Highest education level: Not on file  Occupational History  . Occupation: retired    Comment: [employer]  Tobacco Use  . Smoking status: Never Smoker  . Smokeless tobacco: Never Used  Vaping Use  . Vaping Use: Never used  Substance and Sexual Activity  . Alcohol use: No  . Drug use: No  . Sexual activity: Not Currently  Other Topics Concern  . Not on file  Social History Narrative  . Not on file   Social Determinants of Health   Financial Resource Strain: Not on file  Food Insecurity: Not on file  Transportation Needs: Not on file  Physical Activity: Not on file  Stress: Not on file  Social Connections: Not on file  Intimate Partner Violence: Not on file    Family History  Problem Relation Age of Onset  . Alzheimer's disease Father   . Lung cancer Sister   . Cancer Sister      Current Outpatient Medications:  .  allopurinol (ZYLOPRIM) 300 MG tablet, TAKE 1 TABLET BY MOUTH EVERY DAY,  Disp: 30 tablet, Rfl: 3 .  aspirin EC 81 MG tablet, Take 81 mg by mouth daily., Disp: , Rfl:  .  Cyanocobalamin (B-12 PO), Take 3,000 mcg by mouth daily., Disp: , Rfl:  .  hydrochlorothiazide (MICROZIDE) 12.5 MG capsule, Take 1 capsule (12.5 mg total) by mouth daily., Disp: 90 capsule, Rfl: 1 .  KLOR-CON M20 20 MEQ tablet, TAKE 1 TABLET BY MOUTH TWICE A DAY, Disp: 180 tablet, Rfl: 1 .  pantoprazole (PROTONIX) 40 MG tablet, Take 1 tablet (40 mg total) by mouth daily., Disp: 90 tablet, Rfl: 1 .  valACYclovir (VALTREX) 500 MG tablet, TAKE 1 TABLET BY MOUTH EVERY DAY, Disp: 90 tablet, Rfl: 1  Physical exam:  Vitals:   03/15/20 1349 03/15/20 1459  BP: 127/79   Pulse: 87   Resp: 16   Temp: 98.1 F (36.7 C)   Weight:  (!) 319 lb (144.7 kg)   Physical Exam Constitutional:      General: He is not in acute distress. Eyes:     Extraocular Movements: EOM normal.  Cardiovascular:     Rate and Rhythm: Normal rate and regular rhythm.     Heart sounds: Normal heart sounds.  Pulmonary:     Effort: Pulmonary effort is normal.     Breath sounds: Normal breath sounds.  Abdominal:     General: Bowel sounds are normal.     Palpations: Abdomen is soft.  Skin:    General: Skin  is warm and dry.  Neurological:     Mental Status: He is alert and oriented to person, place, and time.      CMP Latest Ref Rng & Units 03/15/2020  Glucose 70 - 99 mg/dL 103(H)  BUN 8 - 23 mg/dL 14  Creatinine 0.61 - 1.24 mg/dL 0.99  Sodium 135 - 145 mmol/L 136  Potassium 3.5 - 5.1 mmol/L 3.8  Chloride 98 - 111 mmol/L 101  CO2 22 - 32 mmol/L 27  Calcium 8.9 - 10.3 mg/dL 9.2  Total Protein 6.5 - 8.1 g/dL 8.1  Total Bilirubin 0.3 - 1.2 mg/dL 0.7  Alkaline Phos 38 - 126 U/L 71  AST 15 - 41 U/L 11(L)  ALT 0 - 44 U/L 8   CBC Latest Ref Rng & Units 03/15/2020  WBC 4.0 - 10.5 K/uL 14.6(H)  Hemoglobin 13.0 - 17.0 g/dL 13.0  Hematocrit 39.0 - 52.0 % 40.9  Platelets 150 - 400 K/uL 363     Assessment and plan- Patient  is a 80 y.o. male with CMML 1 here for on treatment assessment prior neck cycle of Vidaza  Counts okay to proceed with Vidaza today day 1 today 5.  Counts are remaining stable so far on Vidaza.  He did have advanced bone marrow biopsy in April 2021 which had shown findings of persistent CMML.  I will see him back in 4 weeks time with CBC with differential, CMP for cycle 20.  Iron deficiency anemia: Ferritin levels came back low at 12.  He will receive 1 dose of Feraheme this week and second dose next week   Visit Diagnosis 1. 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 Encounter for antineoplastic chemotherapy      Dr. Randa Evens, MD, MPH [facility] at [facility] [phone] 03/15/2020 4:26 PM

## 2020-03-15 NOTE — Progress Notes (Signed)
Pt received vidaza injection in clinic today. Tolerated well. 

## 2020-03-16 ENCOUNTER — Inpatient Hospital Stay: Payer: Medicare Other

## 2020-03-16 VITALS — BP 127/76 | HR 91 | Temp 97.6°F | Resp 20

## 2020-03-16 DIAGNOSIS — C9310 Chronic myelomonocytic leukemia not having achieved remission: Secondary | ICD-10-CM | POA: Diagnosis not present

## 2020-03-16 DIAGNOSIS — Z79899 Other long term (current) drug therapy: Secondary | ICD-10-CM | POA: Diagnosis not present

## 2020-03-16 DIAGNOSIS — Z5111 Encounter for antineoplastic chemotherapy: Secondary | ICD-10-CM | POA: Diagnosis not present

## 2020-03-16 MED ORDER — AZACITIDINE CHEMO SQ INJECTION
50.0000 mg/m2 | Freq: Once | INTRAMUSCULAR | Status: AC
  Administered 2020-03-16: 132.5 mg via SUBCUTANEOUS
  Filled 2020-03-16: qty 5.3

## 2020-03-16 MED ORDER — ONDANSETRON HCL 4 MG PO TABS
8.0000 mg | ORAL_TABLET | Freq: Once | ORAL | Status: AC
  Administered 2020-03-16: 8 mg via ORAL
  Filled 2020-03-16: qty 2

## 2020-03-16 NOTE — Progress Notes (Signed)
Stable at discharge 

## 2020-03-17 ENCOUNTER — Other Ambulatory Visit: Payer: Self-pay

## 2020-03-17 ENCOUNTER — Inpatient Hospital Stay: Payer: Medicare Other

## 2020-03-17 VITALS — BP 115/66 | HR 101 | Temp 99.0°F | Resp 18

## 2020-03-17 DIAGNOSIS — C9310 Chronic myelomonocytic leukemia not having achieved remission: Secondary | ICD-10-CM | POA: Diagnosis not present

## 2020-03-17 DIAGNOSIS — Z5111 Encounter for antineoplastic chemotherapy: Secondary | ICD-10-CM | POA: Diagnosis not present

## 2020-03-17 DIAGNOSIS — Z79899 Other long term (current) drug therapy: Secondary | ICD-10-CM | POA: Diagnosis not present

## 2020-03-17 DIAGNOSIS — D509 Iron deficiency anemia, unspecified: Secondary | ICD-10-CM

## 2020-03-17 MED ORDER — AZACITIDINE CHEMO SQ INJECTION
50.0000 mg/m2 | Freq: Once | INTRAMUSCULAR | Status: AC
  Administered 2020-03-17: 132.5 mg via SUBCUTANEOUS
  Filled 2020-03-17: qty 5.3

## 2020-03-17 MED ORDER — ONDANSETRON HCL 4 MG PO TABS
8.0000 mg | ORAL_TABLET | Freq: Once | ORAL | Status: AC
  Administered 2020-03-17: 8 mg via ORAL
  Filled 2020-03-17: qty 2

## 2020-03-17 MED ORDER — SODIUM CHLORIDE 0.9 % IV SOLN
510.0000 mg | Freq: Once | INTRAVENOUS | Status: AC
  Administered 2020-03-17: 510 mg via INTRAVENOUS
  Filled 2020-03-17: qty 510

## 2020-03-17 MED ORDER — SODIUM CHLORIDE 0.9 % IV SOLN
Freq: Once | INTRAVENOUS | Status: AC
  Filled 2020-03-17: qty 250

## 2020-03-17 NOTE — Progress Notes (Signed)
Pt tolerated treatment well. No s/s of distress or reaction noted. Pt declines to sty 30 minutes post infusion. Pt and VS stable at discharge.

## 2020-03-18 ENCOUNTER — Inpatient Hospital Stay: Payer: Medicare Other

## 2020-03-18 VITALS — BP 122/76 | HR 97 | Temp 97.3°F | Resp 18

## 2020-03-18 DIAGNOSIS — Z5111 Encounter for antineoplastic chemotherapy: Secondary | ICD-10-CM | POA: Diagnosis not present

## 2020-03-18 DIAGNOSIS — Z79899 Other long term (current) drug therapy: Secondary | ICD-10-CM | POA: Diagnosis not present

## 2020-03-18 DIAGNOSIS — C9310 Chronic myelomonocytic leukemia not having achieved remission: Secondary | ICD-10-CM | POA: Diagnosis not present

## 2020-03-18 MED ORDER — AZACITIDINE CHEMO SQ INJECTION
50.0000 mg/m2 | Freq: Once | INTRAMUSCULAR | Status: AC
  Administered 2020-03-18: 132.5 mg via SUBCUTANEOUS
  Filled 2020-03-18: qty 5.3

## 2020-03-18 MED ORDER — ONDANSETRON HCL 4 MG PO TABS
8.0000 mg | ORAL_TABLET | Freq: Once | ORAL | Status: AC
  Administered 2020-03-18: 8 mg via ORAL
  Filled 2020-03-18: qty 2

## 2020-03-19 ENCOUNTER — Other Ambulatory Visit: Payer: Self-pay

## 2020-03-19 ENCOUNTER — Inpatient Hospital Stay: Payer: Medicare Other

## 2020-03-19 VITALS — BP 124/75 | HR 97 | Temp 98.3°F | Resp 18

## 2020-03-19 DIAGNOSIS — C9310 Chronic myelomonocytic leukemia not having achieved remission: Secondary | ICD-10-CM | POA: Diagnosis not present

## 2020-03-19 DIAGNOSIS — Z5111 Encounter for antineoplastic chemotherapy: Secondary | ICD-10-CM | POA: Diagnosis not present

## 2020-03-19 DIAGNOSIS — Z79899 Other long term (current) drug therapy: Secondary | ICD-10-CM | POA: Diagnosis not present

## 2020-03-19 MED ORDER — ONDANSETRON HCL 4 MG PO TABS
8.0000 mg | ORAL_TABLET | Freq: Once | ORAL | Status: AC
  Administered 2020-03-19: 8 mg via ORAL
  Filled 2020-03-19: qty 2

## 2020-03-19 MED ORDER — AZACITIDINE CHEMO SQ INJECTION
50.0000 mg/m2 | Freq: Once | INTRAMUSCULAR | Status: AC
  Administered 2020-03-19: 132.5 mg via SUBCUTANEOUS
  Filled 2020-03-19: qty 5.3

## 2020-03-19 NOTE — Progress Notes (Signed)
Pt stable at discharge.  

## 2020-03-29 ENCOUNTER — Ambulatory Visit: Payer: Medicare Other | Admitting: Hospice and Palliative Medicine

## 2020-04-01 ENCOUNTER — Ambulatory Visit (INDEPENDENT_AMBULATORY_CARE_PROVIDER_SITE_OTHER): Payer: Medicare Other | Admitting: Hospice and Palliative Medicine

## 2020-04-01 ENCOUNTER — Encounter: Payer: Self-pay | Admitting: Hospice and Palliative Medicine

## 2020-04-01 VITALS — BP 138/80 | HR 94 | Temp 97.6°F | Resp 16 | Ht 74.5 in | Wt 310.6 lb

## 2020-04-01 DIAGNOSIS — I700 Atherosclerosis of aorta: Secondary | ICD-10-CM

## 2020-04-01 DIAGNOSIS — C9310 Chronic myelomonocytic leukemia not having achieved remission: Secondary | ICD-10-CM | POA: Diagnosis not present

## 2020-04-01 DIAGNOSIS — I10 Essential (primary) hypertension: Secondary | ICD-10-CM | POA: Diagnosis not present

## 2020-04-01 NOTE — Progress Notes (Signed)
[facility] [address] [place], [place] [address]  Internal MEDICINE  Office Visit Note  Patient Name: Samuel O Holmes Jr.  05/06/1940  [id number]  Date of Service: 04/01/2020  Chief Complaint  Patient presents with  . Hypertension  . Anemia    HPI Patient is being seen for routine follow-up Followed by oncology for CMML 1 with advanced bone marrow biopsy currently being treated with Vidaza--counts remaining stable Ferritin levels low has receiving IV iron infusions Tolerating treatments well without major side effects, appetite remains stable, not currently complaining of pain Continues to sleep well at night without difficulty Blood pressure routinely checked when receiving treatments, he reports it has been well controlled   Current Medication: Outpatient Encounter Medications as of 04/01/2020  Medication Sig  . allopurinol (ZYLOPRIM) 300 MG tablet TAKE 1 TABLET BY MOUTH EVERY DAY  . aspirin EC 81 MG tablet Take 81 mg by mouth daily.  . Cyanocobalamin (B-12 PO) Take 3,000 mcg by mouth daily.  . hydrochlorothiazide (MICROZIDE) 12.5 MG capsule Take 1 capsule (12.5 mg total) by mouth daily.  . KLOR-CON M20 20 MEQ tablet TAKE 1 TABLET BY MOUTH TWICE A DAY  . pantoprazole (PROTONIX) 40 MG tablet Take 1 tablet (40 mg total) by mouth daily.  . valACYclovir (VALTREX) 500 MG tablet TAKE 1 TABLET BY MOUTH EVERY DAY   No facility-administered encounter medications on file as of 04/01/2020.    Surgical History: Past Surgical History:  Procedure Laterality Date  . COLONOSCOPY N/A 08/28/2019   Procedure: COLONOSCOPY;  Surgeon: Toledo, Teodoro K, MD;  Location: [facility] ENDOSCOPY;  Service: Gastroenterology;  Laterality: N/A;  . COLONOSCOPY WITH PROPOFOL N/A 05/03/2015   Procedure: COLONOSCOPY WITH PROPOFOL;  Surgeon: Paul Y Oh, MD;  Location: [facility] ENDOSCOPY;  Service: Gastroenterology;  Laterality: N/A;  . ESOPHAGOGASTRODUODENOSCOPY N/A 08/28/2019   Procedure:  ESOPHAGOGASTRODUODENOSCOPY (EGD);  Surgeon: Toledo, Teodoro K, MD;  Location: [facility] ENDOSCOPY;  Service: Gastroenterology;  Laterality: N/A;  . ESOPHAGOGASTRODUODENOSCOPY (EGD) WITH PROPOFOL N/A 05/03/2015   Procedure: ESOPHAGOGASTRODUODENOSCOPY (EGD) WITH PROPOFOL;  Surgeon: Paul Y Oh, MD;  Location: [facility] ENDOSCOPY;  Service: Gastroenterology;  Laterality: N/A;  . HERNIA REPAIR     1975    Medical History: Past Medical History:  Diagnosis Date  . Anemia   . CMML (chronic myelomonocytic leukemia) (HCC)   . Dyspnea   . Hypertension     Family History: Family History  Problem Relation Age of Onset  . Alzheimer's disease Father   . Lung cancer Sister   . Cancer Sister     Social History   Socioeconomic History  . Marital status: Divorced    Spouse name: Not on file  . Number of children: 4  . Years of education: Not on file  . Highest education level: Not on file  Occupational History  . Occupation: retired    Comment: [employer]  Tobacco Use  . Smoking status: Never Smoker  . Smokeless tobacco: Never Used  Vaping Use  . Vaping Use: Never used  Substance and Sexual Activity  . Alcohol use: No  . Drug use: No  . Sexual activity: Not Currently  Other Topics Concern  . Not on file  Social History Narrative  . Not on file   Social Determinants of Health   Financial Resource Strain: Not on file  Food Insecurity: Not on file  Transportation Needs: Not on file  Physical Activity: Not on file  Stress: Not on file  Social Connections: Not   on file  Intimate Partner Violence: Not on file      Review of Systems  Constitutional: Negative for chills, fatigue and unexpected weight change.  HENT: Negative for congestion, postnasal drip, rhinorrhea, sneezing and sore throat.   Eyes: Negative for redness.  Respiratory: Negative for cough, chest tightness and shortness of breath.   Cardiovascular: Negative for chest pain, palpitations and leg swelling.   Gastrointestinal: Negative for abdominal pain, constipation, diarrhea, nausea and vomiting.  Genitourinary: Negative for dysuria and frequency.  Musculoskeletal: Negative for arthralgias, back pain, joint swelling and neck pain.  Skin: Negative for rash.  Neurological: Negative for dizziness, tremors, numbness and headaches.  Hematological: Negative for adenopathy. Does not bruise/bleed easily.  Psychiatric/Behavioral: Negative for behavioral problems (Depression), sleep disturbance and suicidal ideas. The patient is not nervous/anxious.     Vital Signs: BP 138/80   Pulse 94   Temp 97.6 F (36.4 C)   Resp 16   Ht 6' 2.5" (1.892 m)   Wt (!) 310 lb 9.6 oz (140.9 kg)   SpO2 98%   BMI 39.35 kg/m    Physical Exam Vitals reviewed.  Constitutional:      Appearance: Normal appearance. He is obese.  Cardiovascular:     Rate and Rhythm: Normal rate and regular rhythm.     Pulses: Normal pulses.     Heart sounds: Normal heart sounds.  Pulmonary:     Effort: Pulmonary effort is normal.     Breath sounds: Normal breath sounds.  Abdominal:     General: Abdomen is flat.     Palpations: Abdomen is soft.  Musculoskeletal:        General: Normal range of motion.     Cervical back: Normal range of motion.  Skin:    General: Skin is warm.  Neurological:     General: No focal deficit present.     Mental Status: He is alert and oriented to person, place, and time. Mental status is at baseline.  Psychiatric:        Mood and Affect: Mood normal.        Behavior: Behavior normal.        Thought Content: Thought content normal.        Judgment: Judgment normal.    Assessment/Plan: 1. Essential hypertension, benign BP and HR remain well controlled on current therapy--continue to monitor  2. 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 Atherosclerosis of aorta  (HCC) Will need to consider LDL-lowering agent due to atherosclerosis Will need updated lipid panel  General Counseling: Bernice verbalizes understanding of the findings of todays visit and agrees with plan of treatment. I have discussed any further diagnostic evaluation that may be needed or ordered today. We also reviewed his medications today. he has been encouraged to call the office with any questions or concerns that should arise related to todays visit.  Time spent: 30 Minutes Time spent includes review of chart, medications, test results and follow-up plan with the patient.  This patient was seen by Taylor Harris AGNP-C in Collaboration with Dr Fozia M Khan as a part of collaborative care agreement     Taylor S. Harris AGNP-C Internal medicine  

## 2020-04-02 ENCOUNTER — Encounter: Payer: Self-pay | Admitting: Hospice and Palliative Medicine

## 2020-04-12 ENCOUNTER — Inpatient Hospital Stay: Payer: Medicare Other | Attending: Oncology

## 2020-04-12 ENCOUNTER — Encounter: Payer: Self-pay | Admitting: Oncology

## 2020-04-12 ENCOUNTER — Inpatient Hospital Stay: Payer: Medicare Other

## 2020-04-12 ENCOUNTER — Inpatient Hospital Stay (HOSPITAL_BASED_OUTPATIENT_CLINIC_OR_DEPARTMENT_OTHER): Payer: Medicare Other | Admitting: Oncology

## 2020-04-12 VITALS — BP 116/70 | HR 92 | Temp 97.8°F | Resp 20 | Wt 311.7 lb

## 2020-04-12 DIAGNOSIS — C9311 Chronic myelomonocytic leukemia, in remission: Secondary | ICD-10-CM | POA: Diagnosis not present

## 2020-04-12 DIAGNOSIS — C9310 Chronic myelomonocytic leukemia not having achieved remission: Secondary | ICD-10-CM

## 2020-04-12 DIAGNOSIS — Z5111 Encounter for antineoplastic chemotherapy: Secondary | ICD-10-CM | POA: Diagnosis not present

## 2020-04-12 LAB — COMPREHENSIVE METABOLIC PANEL
ALT: 10 U/L (ref 0–44)
AST: 15 U/L (ref 15–41)
Albumin: 4 g/dL (ref 3.5–5.0)
Alkaline Phosphatase: 73 U/L (ref 38–126)
Anion gap: 8 (ref 5–15)
BUN: 11 mg/dL (ref 8–23)
CO2: 27 mmol/L (ref 22–32)
Calcium: 9.1 mg/dL (ref 8.9–10.3)
Chloride: 101 mmol/L (ref 98–111)
Creatinine, Ser: 1.07 mg/dL (ref 0.61–1.24)
GFR, Estimated: >60 mL/min (ref >60)
Glucose, Bld: 106 mg/dL — ABNORMAL HIGH (ref 70–99)
Potassium: 4.1 mmol/L (ref 3.5–5.1)
Sodium: 136 mmol/L (ref 135–145)
Total Bilirubin: 0.7 mg/dL (ref 0.3–1.2)
Total Protein: 7.9 g/dL (ref 6.5–8.1)

## 2020-04-12 LAB — CBC WITH DIFFERENTIAL/PLATELET
Abs Immature Granulocytes: 0.2 10*3/uL — ABNORMAL HIGH (ref 0.00–0.07)
Band Neutrophils: 1 %
Basophils Absolute: 0.2 10*3/uL — ABNORMAL HIGH (ref 0.0–0.1)
Basophils Relative: 1 %
Eosinophils Absolute: 0.3 10*3/uL (ref 0.0–0.5)
Eosinophils Relative: 2 %
HCT: 44.3 % (ref 39.0–52.0)
Hemoglobin: 14.1 g/dL (ref 13.0–17.0)
Lymphocytes Relative: 12 %
Lymphs Abs: 1.8 10*3/uL (ref 0.7–4.0)
MCH: 25.5 pg — ABNORMAL LOW (ref 26.0–34.0)
MCHC: 31.8 g/dL (ref 30.0–36.0)
MCV: 80.1 fL (ref 80.0–100.0)
Monocytes Absolute: 1.1 10*3/uL — ABNORMAL HIGH (ref 0.1–1.0)
Monocytes Relative: 7 %
Myelocytes: 1 %
Neutro Abs: 11.6 10*3/uL — ABNORMAL HIGH (ref 1.7–7.7)
Neutrophils Relative %: 76 %
Platelets: 561 10*3/uL — ABNORMAL HIGH (ref 150–400)
RBC: 5.53 MIL/uL (ref 4.22–5.81)
RDW: 25 % — ABNORMAL HIGH (ref 11.5–15.5)
Smear Review: INCREASED
WBC: 15 10*3/uL — ABNORMAL HIGH (ref 4.0–10.5)
nRBC: 0.2 % (ref 0.0–0.2)

## 2020-04-12 MED ORDER — ONDANSETRON HCL 4 MG PO TABS
8.0000 mg | ORAL_TABLET | Freq: Once | ORAL | Status: AC
  Administered 2020-04-12: 8 mg via ORAL
  Filled 2020-04-12: qty 2

## 2020-04-12 MED ORDER — AZACITIDINE CHEMO SQ INJECTION
50.0000 mg/m2 | Freq: Once | INTRAMUSCULAR | Status: AC
  Administered 2020-04-12: 132.5 mg via SUBCUTANEOUS
  Filled 2020-04-12: qty 5.3

## 2020-04-13 ENCOUNTER — Inpatient Hospital Stay: Payer: Medicare Other

## 2020-04-13 VITALS — BP 119/74 | HR 94 | Temp 98.8°F | Resp 16

## 2020-04-13 DIAGNOSIS — C9310 Chronic myelomonocytic leukemia not having achieved remission: Secondary | ICD-10-CM

## 2020-04-13 DIAGNOSIS — C9311 Chronic myelomonocytic leukemia, in remission: Secondary | ICD-10-CM | POA: Diagnosis not present

## 2020-04-13 MED ORDER — ONDANSETRON HCL 4 MG PO TABS
8.0000 mg | ORAL_TABLET | Freq: Once | ORAL | Status: AC
  Administered 2020-04-13: 8 mg via ORAL
  Filled 2020-04-13: qty 2

## 2020-04-13 MED ORDER — AZACITIDINE CHEMO SQ INJECTION
50.0000 mg/m2 | Freq: Once | INTRAMUSCULAR | Status: AC
  Administered 2020-04-13: 132.5 mg via SUBCUTANEOUS
  Filled 2020-04-13: qty 5.3

## 2020-04-13 NOTE — Progress Notes (Signed)
SQ Vidaza well tolerated. Discharged home in stable condition.

## 2020-04-14 ENCOUNTER — Ambulatory Visit: Payer: Medicare Other

## 2020-04-14 ENCOUNTER — Inpatient Hospital Stay: Payer: Medicare Other

## 2020-04-14 VITALS — BP 107/73 | HR 105 | Temp 98.3°F | Resp 16

## 2020-04-14 DIAGNOSIS — C9311 Chronic myelomonocytic leukemia, in remission: Secondary | ICD-10-CM | POA: Diagnosis not present

## 2020-04-14 DIAGNOSIS — D509 Iron deficiency anemia, unspecified: Secondary | ICD-10-CM

## 2020-04-14 DIAGNOSIS — C9310 Chronic myelomonocytic leukemia not having achieved remission: Secondary | ICD-10-CM

## 2020-04-14 MED ORDER — AZACITIDINE CHEMO SQ INJECTION
50.0000 mg/m2 | Freq: Once | INTRAMUSCULAR | Status: AC
  Administered 2020-04-14: 132.5 mg via SUBCUTANEOUS
  Filled 2020-04-14: qty 5.3

## 2020-04-14 MED ORDER — SODIUM CHLORIDE 0.9 % IV SOLN
510.0000 mg | Freq: Once | INTRAVENOUS | Status: AC
  Administered 2020-04-14: 510 mg via INTRAVENOUS
  Filled 2020-04-14: qty 510

## 2020-04-14 MED ORDER — ONDANSETRON HCL 4 MG PO TABS
8.0000 mg | ORAL_TABLET | Freq: Once | ORAL | Status: AC
  Administered 2020-04-14: 8 mg via ORAL
  Filled 2020-04-14: qty 2

## 2020-04-14 MED ORDER — SODIUM CHLORIDE 0.9 % IV SOLN
Freq: Once | INTRAVENOUS | Status: AC
  Filled 2020-04-14: qty 250

## 2020-04-14 NOTE — Progress Notes (Signed)
Tolerated Vidaza SQ and feraheme infusion well. Discharged home in stable condition.

## 2020-04-14 NOTE — Progress Notes (Signed)
Hematology/Oncology Consult note [facility]  [phone] [phone]  Patient Care Team: Lavera Guise, MD as PCP - General (Internal Medicine) Sindy Guadeloupe, MD as Consulting Physician (Oncology)   Name of the patient: Samuel Frank  [id number]  February 18, 1941   Date of visit: 04/14/20  Diagnosis- on treatment assessment prior to next cycle of Vidaza  Chief complaint/ Reason for visit-on treatment assessment prior to next cycle of Vidaza  Heme/Onc history: Patient is a80yrold male with no significant co morbidities other than hypertension. He has recently been having bilateral knee pain and has undergone steroid injection in his knee.Patient was noted to have a high white count on his routine exam. His white count was 19.2 on 07/22/2018 with an H&H of 11.4/35.1 and a platelet count of 470. At that point he was prescribed a course of antibiotics and a repeat blood work on 08/21/2018 showed white count of 42.3, H&H of 7.1/23 with an MCV of 84 and a platelet count of 791 and hence the patient was referred to hematology for further management.  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 In addition the core biopsy showed a small focus of fibrosis containing scattering of mast cells and eosinophils most suggestive of systemic mastocytosis.bone marrow blasts 7%.Flow cytometry showed increased number of monocytic cells representing 16% of all cells with expression for HLA-DR, CD11b, CD13, CD14, CD33, CD38, CD56 and CD56 and CD64 with [facility] CD 117 are CD34.normal cytogenetics, CBL, SRSF2, SH2B3 and TET2  Repeat bone marrow biopsy after 4 cycles of Vidaza showed less pronounced monocytic/granulocytic proliferation and overall better granulopoiesis and no increase in blasts. In addition features of systemic mastocytosis not present.  After 9 cycles of Vidaza patient was noted to have  significant anemia and worsening thrombocytosis for which a repeat bone marrow biopsy was done. Bone marrow did not show any features of progression of CMML. Overall stable findings and no increased blast.He was found to have iron deficiency and received 2 doses of Feraheme.  [facility] and [facility] EGD and colonoscopy for iron deficiency anemia.Colonoscopy showed nonbleeding internal hemorrhoids. Normal mucosa in the colon. No polyps EGD showed normal esophagus and gastric mucosal atrophy. No stigmata of bleeding  Interval history- patient is doing well overall and denies any complaints at this time  ECOG PS- 1 Pain scale- 0  Review of systems- Review of Systems  Constitutional: Negative for chills, fever, malaise/fatigue and weight loss.  HENT: Negative for congestion, ear discharge and nosebleeds.   Eyes: Negative for blurred vision.  Respiratory: Negative for cough, hemoptysis, sputum production, shortness of breath and wheezing.   Cardiovascular: Negative for chest pain, palpitations, orthopnea and claudication.  Gastrointestinal: Negative for abdominal pain, blood in stool, constipation, diarrhea, heartburn, melena, nausea and vomiting.  Genitourinary: Negative for dysuria, flank pain, frequency, hematuria and urgency.  Musculoskeletal: Negative for back pain, joint pain and myalgias.  Skin: Negative for rash.  Neurological: Negative for dizziness, tingling, focal weakness, seizures, weakness and headaches.  Endo/Heme/Allergies: Does not bruise/bleed easily.  Psychiatric/Behavioral: Negative for depression and suicidal ideas. The patient does not have insomnia.       No Known Allergies   Past Medical History:  Diagnosis Date  . Anemia   . [place]   . Dyspnea   . Hypertension      Past Surgical History:  Procedure Laterality Date  . COLONOSCOPY N/A 08/28/2019  Procedure: COLONOSCOPY;  Surgeon: Toledo, Benay Pike, MD;  Location: [facility] ENDOSCOPY;  Service: Gastroenterology;  Laterality: N/A;  . COLONOSCOPY WITH PROPOFOL N/A 05/03/2015   Procedure: COLONOSCOPY WITH PROPOFOL;  Surgeon: [name], MD;  Location: [facility] ENDOSCOPY;  Service: Gastroenterology;  Laterality: N/A;  . ESOPHAGOGASTRODUODENOSCOPY N/A 08/28/2019   Procedure: ESOPHAGOGASTRODUODENOSCOPY (EGD);  Surgeon: Toledo, Benay Pike, MD;  Location: [facility] ENDOSCOPY;  Service: Gastroenterology;  Laterality: N/A;  . ESOPHAGOGASTRODUODENOSCOPY (EGD) WITH PROPOFOL N/A 05/03/2015   Procedure: ESOPHAGOGASTRODUODENOSCOPY (EGD) WITH PROPOFOL;  Surgeon: [name], MD;  Location: [facility] ENDOSCOPY;  Service: Gastroenterology;  Laterality: N/A;  . HERNIA REPAIR     1975    Social History   Socioeconomic History  . Marital status: Divorced    Spouse name: Not on file  . Number of children: 4  . Years of education: Not on file  . Highest education level: Not on file  Occupational History  . Occupation: retired    Comment: [employer]  Tobacco Use  . Smoking status: Never Smoker  . Smokeless tobacco: Never Used  Vaping Use  . Vaping Use: Never used  Substance and Sexual Activity  . Alcohol use: No  . Drug use: No  . Sexual activity: Not Currently  Other Topics Concern  . Not on file  Social History Narrative  . Not on file   Social Determinants of Health   Financial Resource Strain: Not on file  Food Insecurity: Not on file  Transportation Needs: Not on file  Physical Activity: Not on file  Stress: Not on file  Social Connections: Not on file  Intimate Partner Violence: Not on file    Family History  Problem Relation Age of Onset  . Alzheimer's disease Father   . Lung cancer Sister   . Cancer Sister      Current Outpatient Medications:  .  allopurinol (ZYLOPRIM) 300 MG tablet, TAKE 1 TABLET BY MOUTH EVERY DAY, Disp: 30 tablet, Rfl: 3 .  aspirin EC 81 MG tablet, Take 81 mg by mouth daily., Disp: , Rfl:  .  Cyanocobalamin  (B-12 PO), Take 3,000 mcg by mouth daily., Disp: , Rfl:  .  hydrochlorothiazide (MICROZIDE) 12.5 MG capsule, Take 1 capsule (12.5 mg total) by mouth daily., Disp: 90 capsule, Rfl: 1 .  KLOR-CON M20 20 MEQ tablet, TAKE 1 TABLET BY MOUTH TWICE A DAY, Disp: 180 tablet, Rfl: 1 .  pantoprazole (PROTONIX) 40 MG tablet, Take 1 tablet (40 mg total) by mouth daily., Disp: 90 tablet, Rfl: 1 .  valACYclovir (VALTREX) 500 MG tablet, TAKE 1 TABLET BY MOUTH EVERY DAY, Disp: 90 tablet, Rfl: 1  Physical exam:  Vitals:   04/12/20 1350  BP: 116/70  Pulse: 92  Resp: 20  Temp: 97.8 F (36.6 C)  TempSrc: Tympanic  SpO2: 98%  Weight: (!) 311 lb 11.2 oz (141.4 kg)   Physical Exam Constitutional:      General: He is not in acute distress. Eyes:     Extraocular Movements: EOM normal.     Pupils: Pupils are equal, round, and reactive to light.  Cardiovascular:     Rate and Rhythm: Normal rate and regular rhythm.     Heart sounds: Normal heart sounds.  Pulmonary:     Effort: Pulmonary effort is normal.     Breath sounds: Normal breath sounds.  Abdominal:     General: Bowel sounds are normal.     Palpations: Abdomen is soft.  Skin:  General: Skin is warm and dry.  Neurological:     Mental Status: He is alert and oriented to person, place, and time.      CMP Latest Ref Rng & Units 04/12/2020  Glucose 70 - 99 mg/dL 106(H)  BUN 8 - 23 mg/dL 11  Creatinine 0.61 - 1.24 mg/dL 1.07  Sodium 135 - 145 mmol/L 136  Potassium 3.5 - 5.1 mmol/L 4.1  Chloride 98 - 111 mmol/L 101  CO2 22 - 32 mmol/L 27  Calcium 8.9 - 10.3 mg/dL 9.1  Total Protein 6.5 - 8.1 g/dL 7.9  Total Bilirubin 0.3 - 1.2 mg/dL 0.7  Alkaline Phos 38 - 126 U/L 73  AST 15 - 41 U/L 15  ALT 0 - 44 U/L 10   CBC Latest Ref Rng & Units 04/12/2020  WBC 4.0 - 10.5 K/uL 15.0(H)  Hemoglobin 13.0 - 17.0 g/dL 14.1  Hematocrit 39.0 - 52.0 % 44.3  Platelets 150 - 400 K/uL 561(H)     Assessment and plan- 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  He is tolerating treatment well without any significant side effects with stable counts.  I will see him back in 4 weeks with CBC with differential and CMP for cycle 21  He does have intermittent iron deficiency anemia for which she has received Feraheme in the past   Visit Diagnosis 1. Encounter for antineoplastic chemotherapy   2. Chronic myelomonocytic leukemia in remission [facility])      Dr. Randa Evens, MD, MPH [facility] [phone] 04/14/2020 10:30 AM

## 2020-04-15 ENCOUNTER — Inpatient Hospital Stay: Payer: Medicare Other

## 2020-04-15 ENCOUNTER — Other Ambulatory Visit: Payer: Self-pay | Admitting: Oncology

## 2020-04-15 VITALS — BP 114/64 | HR 101 | Temp 99.8°F | Resp 18

## 2020-04-15 DIAGNOSIS — C9311 Chronic myelomonocytic leukemia, in remission: Secondary | ICD-10-CM | POA: Diagnosis not present

## 2020-04-15 DIAGNOSIS — C9310 Chronic myelomonocytic leukemia not having achieved remission: Secondary | ICD-10-CM

## 2020-04-15 MED ORDER — ONDANSETRON HCL 4 MG PO TABS
8.0000 mg | ORAL_TABLET | Freq: Once | ORAL | Status: AC
  Administered 2020-04-15: 8 mg via ORAL
  Filled 2020-04-15: qty 2

## 2020-04-15 MED ORDER — AZACITIDINE CHEMO SQ INJECTION
50.0000 mg/m2 | Freq: Once | INTRAMUSCULAR | Status: AC
  Administered 2020-04-15: 132.5 mg via SUBCUTANEOUS
  Filled 2020-04-15: qty 5.3

## 2020-04-15 NOTE — Progress Notes (Signed)
Patient tolerated injection well. Discharged home.  

## 2020-04-16 ENCOUNTER — Inpatient Hospital Stay: Payer: Medicare Other

## 2020-04-16 VITALS — BP 114/71 | HR 100 | Temp 97.0°F | Resp 18

## 2020-04-16 DIAGNOSIS — C9311 Chronic myelomonocytic leukemia, in remission: Secondary | ICD-10-CM | POA: Diagnosis not present

## 2020-04-16 DIAGNOSIS — C9310 Chronic myelomonocytic leukemia not having achieved remission: Secondary | ICD-10-CM

## 2020-04-16 MED ORDER — ONDANSETRON HCL 4 MG PO TABS
8.0000 mg | ORAL_TABLET | Freq: Once | ORAL | Status: AC
  Administered 2020-04-16: 8 mg via ORAL
  Filled 2020-04-16: qty 2

## 2020-04-16 MED ORDER — AZACITIDINE CHEMO SQ INJECTION
50.0000 mg/m2 | Freq: Once | INTRAMUSCULAR | Status: AC
  Administered 2020-04-16: 132.5 mg via SUBCUTANEOUS
  Filled 2020-04-16: qty 5.3

## 2020-04-16 NOTE — Progress Notes (Signed)
Pt stable at discharge.  

## 2020-05-06 ENCOUNTER — Other Ambulatory Visit: Payer: Self-pay | Admitting: Hospice and Palliative Medicine

## 2020-05-16 ENCOUNTER — Other Ambulatory Visit: Payer: Self-pay | Admitting: *Deleted

## 2020-05-16 DIAGNOSIS — C9310 Chronic myelomonocytic leukemia not having achieved remission: Secondary | ICD-10-CM

## 2020-05-17 ENCOUNTER — Inpatient Hospital Stay: Payer: Medicare Other | Attending: Oncology

## 2020-05-17 ENCOUNTER — Encounter: Payer: Self-pay | Admitting: Oncology

## 2020-05-17 ENCOUNTER — Inpatient Hospital Stay: Payer: Medicare Other

## 2020-05-17 ENCOUNTER — Inpatient Hospital Stay (HOSPITAL_BASED_OUTPATIENT_CLINIC_OR_DEPARTMENT_OTHER): Payer: Medicare Other | Admitting: Oncology

## 2020-05-17 VITALS — BP 120/73 | HR 89 | Temp 98.2°F | Resp 16 | Ht 74.5 in | Wt 307.3 lb

## 2020-05-17 DIAGNOSIS — Z79899 Other long term (current) drug therapy: Secondary | ICD-10-CM | POA: Insufficient documentation

## 2020-05-17 DIAGNOSIS — C9310 Chronic myelomonocytic leukemia not having achieved remission: Secondary | ICD-10-CM | POA: Insufficient documentation

## 2020-05-17 DIAGNOSIS — C9311 Chronic myelomonocytic leukemia, in remission: Secondary | ICD-10-CM | POA: Diagnosis not present

## 2020-05-17 DIAGNOSIS — Z5111 Encounter for antineoplastic chemotherapy: Secondary | ICD-10-CM | POA: Diagnosis not present

## 2020-05-17 LAB — COMPREHENSIVE METABOLIC PANEL
ALT: 10 U/L (ref 0–44)
AST: 13 U/L — ABNORMAL LOW (ref 15–41)
Albumin: 3.8 g/dL (ref 3.5–5.0)
Alkaline Phosphatase: 83 U/L (ref 38–126)
Anion gap: 8 (ref 5–15)
BUN: 8 mg/dL (ref 8–23)
CO2: 27 mmol/L (ref 22–32)
Calcium: 8.9 mg/dL (ref 8.9–10.3)
Chloride: 101 mmol/L (ref 98–111)
Creatinine, Ser: 1.08 mg/dL (ref 0.61–1.24)
GFR, Estimated: >60 mL/min (ref >60)
Glucose, Bld: 103 mg/dL — ABNORMAL HIGH (ref 70–99)
Potassium: 4 mmol/L (ref 3.5–5.1)
Sodium: 136 mmol/L (ref 135–145)
Total Bilirubin: 0.7 mg/dL (ref 0.3–1.2)
Total Protein: 7.6 g/dL (ref 6.5–8.1)

## 2020-05-17 LAB — CBC WITH DIFFERENTIAL/PLATELET
Abs Immature Granulocytes: 0.04 10*3/uL (ref 0.00–0.07)
Basophils Absolute: 0.4 10*3/uL — ABNORMAL HIGH (ref 0.0–0.1)
Basophils Relative: 4 %
Eosinophils Absolute: 0.3 10*3/uL (ref 0.0–0.5)
Eosinophils Relative: 3 %
HCT: 46 % (ref 39.0–52.0)
Hemoglobin: 14.1 g/dL (ref 13.0–17.0)
Immature Granulocytes: 1 %
Lymphocytes Relative: 20 %
Lymphs Abs: 1.7 10*3/uL (ref 0.7–4.0)
MCH: 25.4 pg — ABNORMAL LOW (ref 26.0–34.0)
MCHC: 30.7 g/dL (ref 30.0–36.0)
MCV: 82.9 fL (ref 80.0–100.0)
Monocytes Absolute: 0.7 10*3/uL (ref 0.1–1.0)
Monocytes Relative: 7 %
Neutro Abs: 5.8 10*3/uL (ref 1.7–7.7)
Neutrophils Relative %: 65 %
Platelets: 743 10*3/uL — ABNORMAL HIGH (ref 150–400)
RBC: 5.55 MIL/uL (ref 4.22–5.81)
RDW: 23.7 % — ABNORMAL HIGH (ref 11.5–15.5)
Smear Review: INCREASED
WBC: 8.8 10*3/uL (ref 4.0–10.5)
nRBC: 0.2 % (ref 0.0–0.2)

## 2020-05-17 MED ORDER — AZACITIDINE CHEMO SQ INJECTION
50.0000 mg/m2 | Freq: Once | INTRAMUSCULAR | Status: AC
  Administered 2020-05-17: 132.5 mg via SUBCUTANEOUS
  Filled 2020-05-17: qty 5.3

## 2020-05-17 MED ORDER — ONDANSETRON HCL 4 MG PO TABS
8.0000 mg | ORAL_TABLET | Freq: Once | ORAL | Status: AC
  Administered 2020-05-17: 8 mg via ORAL
  Filled 2020-05-17: qty 2

## 2020-05-17 NOTE — Progress Notes (Signed)
Hematology/Oncology Consult note [facility]  [phone] [phone]  Patient Care Team: Lavera Guise, MD as PCP - General (Internal Medicine) Sindy Guadeloupe, MD as Consulting Physician (Oncology)   Name of the patient: Samuel Frank  [id number]  June 07, 1940   Date of visit: 05/17/20  Diagnosis-CMML 1  Chief complaint/ Reason for visit-on treatment assessment prior to next cycle of Vidaza  Heme/Onc history:  Patient is a80yrold male with no significant co morbidities other than hypertension. He has recently been having bilateral knee pain and has undergone steroid injection in his knee.Patient was noted to have a high white count on his routine exam. His white count was 19.2 on 07/22/2018 with an H&H of 11.4/35.1 and a platelet count of 470. At that point he was prescribed a course of antibiotics and a repeat blood work on 08/21/2018 showed white count of 42.3, H&H of 7.1/23 with an MCV of 84 and a platelet count of 791 and hence the patient was referred to hematology for further management.  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 In addition the core biopsy showed a small focus of fibrosis containing scattering of mast cells and eosinophils most suggestive of systemic mastocytosis.bone marrow blasts 7%.Flow cytometry showed increased number of monocytic cells representing 16% of all cells with expression for HLA-DR, CD11b, CD13, CD14, CD33, CD38, CD56 and CD56 and CD64 with [facility] CD 117 are CD34.normal cytogenetics, CBL, SRSF2, SH2B3 and TET2  Repeat bone marrow biopsy after 4 cycles of Vidaza showed less pronounced monocytic/granulocytic proliferation and overall better granulopoiesis and no increase in blasts. In addition features of systemic mastocytosis not present.  After 9 cycles of Vidaza patient was noted to have significant anemia and worsening thrombocytosis for which  a repeat bone marrow biopsy was done. Bone marrow did not show any features of progression of CMML. Overall stable findings and no increased blast.He was found to have iron deficiency and received 2 doses of Feraheme.  [facility] and underwent EGD and colonoscopy for iron deficiency anemia.Colonoscopy showed nonbleeding internal hemorrhoids. Normal mucosa in the colon. No polyps EGD showed normal esophagus and gastric mucosal atrophy. No stigmata of bleeding   Interval history-patient reports doing well and denies any complaints at this time.  ECOG PS- 1 Pain scale- 0   Review of systems- Review of Systems  Constitutional: Negative for chills, fever, malaise/fatigue and weight loss.  HENT: Negative for congestion, ear discharge and nosebleeds.   Eyes: Negative for blurred vision.  Respiratory: Negative for cough, hemoptysis, sputum production, shortness of breath and wheezing.   Cardiovascular: Negative for chest pain, palpitations, orthopnea and claudication.  Gastrointestinal: Negative for abdominal pain, blood in stool, constipation, diarrhea, heartburn, melena, nausea and vomiting.  Genitourinary: Negative for dysuria, flank pain, frequency, hematuria and urgency.  Musculoskeletal: Negative for back pain, joint pain and myalgias.  Skin: Negative for rash.  Neurological: Negative for dizziness, tingling, focal weakness, seizures, weakness and headaches.  Endo/Heme/Allergies: Does not bruise/bleed easily.  Psychiatric/Behavioral: Negative for depression and suicidal ideas. The patient does not have insomnia.      No Known Allergies   Past Medical History:  Diagnosis Date  . Anemia   . [place]ocytic [place]   . CM[place] (chronic [place]ocytic leukemia) ([place]   . Dyspnea   . Hypertension      Past Surgical History:  Procedure Laterality Date  . COLONOSCOPY N/A 08/28/2019  Procedure: COLONOSCOPY;  Surgeon: Toledo, Benay Pike, MD;   Location: [facility] ENDOSCOPY;  Service: Gastroenterology;  Laterality: N/A;  . COLONOSCOPY WITH PROPOFOL N/A 05/03/2015   Procedure: COLONOSCOPY WITH PROPOFOL;  Surgeon: [name], MD;  Location: [facility] ENDOSCOPY;  Service: Gastroenterology;  Laterality: N/A;  . ESOPHAGOGASTRODUODENOSCOPY N/A 08/28/2019   Procedure: ESOPHAGOGASTRODUODENOSCOPY (EGD);  Surgeon: Toledo, Benay Pike, MD;  Location: [facility] ENDOSCOPY;  Service: Gastroenterology;  Laterality: N/A;  . ESOPHAGOGASTRODUODENOSCOPY (EGD) WITH PROPOFOL N/A 05/03/2015   Procedure: ESOPHAGOGASTRODUODENOSCOPY (EGD) WITH PROPOFOL;  Surgeon: [name], MD;  Location: [facility] ENDOSCOPY;  Service: Gastroenterology;  Laterality: N/A;  . HERNIA REPAIR     1975    Social History   Socioeconomic History  . Marital status: Divorced    Spouse name: Not on file  . Number of children: 4  . Years of education: Not on file  . Highest education level: Not on file  Occupational History  . Occupation: retired    Comment: [employer]  Tobacco Use  . Smoking status: Never Smoker  . Smokeless tobacco: Never Used  Vaping Use  . Vaping Use: Never used  Substance and Sexual Activity  . Alcohol use: No  . Drug use: No  . Sexual activity: Not Currently  Other Topics Concern  . Not on file  Social History Narrative  . Not on file   Social Determinants of Health   Financial Resource Strain: Not on file  Food Insecurity: Not on file  Transportation Needs: Not on file  Physical Activity: Not on file  Stress: Not on file  Social Connections: Not on file  Intimate Partner Violence: Not on file    Family History  Problem Relation Age of Onset  . Alzheimer's disease Father   . Lung cancer Sister   . Cancer Sister      Current Outpatient Medications:  .  allopurinol (ZYLOPRIM) 300 MG tablet, TAKE 1 TABLET BY MOUTH EVERY DAY, Disp: 30 tablet, Rfl: 3 .  aspirin EC 81 MG tablet, Take 81 mg by mouth daily., Disp: , Rfl:  .  hydrochlorothiazide  (MICROZIDE) 12.5 MG capsule, Take 1 capsule (12.5 mg total) by mouth daily., Disp: 90 capsule, Rfl: 1 .  KLOR-CON M20 20 MEQ tablet, TAKE 1 TABLET BY MOUTH TWICE A DAY, Disp: 180 tablet, Rfl: 1 .  pantoprazole (PROTONIX) 40 MG tablet, Take 1 tablet (40 mg total) by mouth daily., Disp: 90 tablet, Rfl: 1 .  valACYclovir (VALTREX) 500 MG tablet, TAKE 1 TABLET BY MOUTH EVERY DAY, Disp: 90 tablet, Rfl: 1  Physical exam:  Vitals:   05/17/20 1306  BP: 120/73  Pulse: 89  Resp: 16  Temp: 98.2 F (36.8 C)  TempSrc: Oral  Weight: (!) 307 lb 4.8 oz (139.4 kg)  Height: 6' 2.5" (1.892 m)   Physical Exam Constitutional:      General: He is not in acute distress. Cardiovascular:     Rate and Rhythm: Normal rate and regular rhythm.     Heart sounds: Normal heart sounds.  Pulmonary:     Effort: Pulmonary effort is normal.     Breath sounds: Normal breath sounds.  Abdominal:     General: Bowel sounds are normal.     Palpations: Abdomen is soft.  Skin:    General: Skin is warm and dry.  Neurological:     Mental Status: He is alert and oriented to person, place, and time.      CMP Latest Ref Rng & Units  05/17/2020  Glucose 70 - 99 mg/dL 103(H)  BUN 8 - 23 mg/dL 8  Creatinine 0.61 - 1.24 mg/dL 1.08  Sodium 135 - 145 mmol/L 136  Potassium 3.5 - 5.1 mmol/L 4.0  Chloride 98 - 111 mmol/L 101  CO2 22 - 32 mmol/L 27  Calcium 8.9 - 10.3 mg/dL 8.9  Total Protein 6.5 - 8.1 g/dL 7.6  Total Bilirubin 0.3 - 1.2 mg/dL 0.7  Alkaline Phos 38 - 126 U/L 83  AST 15 - 41 U/L 13(L)  ALT 0 - 44 U/L 10   CBC Latest Ref Rng & Units 05/17/2020  WBC 4.0 - 10.5 K/uL 8.8  Hemoglobin 13.0 - 17.0 g/dL 14.1  Hematocrit 39.0 - 52.0 % 46.0  Platelets 150 - 400 K/uL 743(H)    Assessment and plan- 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  I will see him back in 4 weeks  for cycle 22.  Patient started Vidaza back in July 2020 and has responded well to treatment without any worsening leukocytosis/monocytosis.  He does have intermittent thrombocytosis which seems to respond to treatment.  Continue to monitor.  Patient will continue to receive treatment until progression or toxicity   Visit Diagnosis 1. Encounter for antineoplastic chemotherapy   2. Chronic myelomonocytic leukemia in remission [facility])      Dr. Randa Evens, MD, MPH [facility] at [facility] [phone] 05/17/2020 1:40 PM

## 2020-05-17 NOTE — Progress Notes (Signed)
Pt doing well, no concerns. Pt wants to get under 300 lbs- he is not snacking at night and eatnig cereal for breakfast each day

## 2020-05-18 ENCOUNTER — Inpatient Hospital Stay: Payer: Medicare Other

## 2020-05-18 VITALS — BP 109/71 | HR 98 | Temp 97.7°F | Resp 18

## 2020-05-18 DIAGNOSIS — Z5111 Encounter for antineoplastic chemotherapy: Secondary | ICD-10-CM | POA: Diagnosis not present

## 2020-05-18 DIAGNOSIS — C9310 Chronic myelomonocytic leukemia not having achieved remission: Secondary | ICD-10-CM

## 2020-05-18 DIAGNOSIS — Z79899 Other long term (current) drug therapy: Secondary | ICD-10-CM | POA: Diagnosis not present

## 2020-05-18 MED ORDER — AZACITIDINE CHEMO SQ INJECTION
50.0000 mg/m2 | Freq: Once | INTRAMUSCULAR | Status: AC
  Administered 2020-05-18: 132.5 mg via SUBCUTANEOUS
  Filled 2020-05-18: qty 5.3

## 2020-05-18 MED ORDER — ONDANSETRON HCL 4 MG PO TABS
8.0000 mg | ORAL_TABLET | Freq: Once | ORAL | Status: AC
  Administered 2020-05-18: 8 mg via ORAL
  Filled 2020-05-18: qty 2

## 2020-05-19 ENCOUNTER — Inpatient Hospital Stay: Payer: Medicare Other

## 2020-05-19 VITALS — BP 122/66 | HR 96 | Temp 96.7°F | Resp 16

## 2020-05-19 DIAGNOSIS — Z5111 Encounter for antineoplastic chemotherapy: Secondary | ICD-10-CM | POA: Diagnosis not present

## 2020-05-19 DIAGNOSIS — C9310 Chronic myelomonocytic leukemia not having achieved remission: Secondary | ICD-10-CM | POA: Diagnosis not present

## 2020-05-19 DIAGNOSIS — Z79899 Other long term (current) drug therapy: Secondary | ICD-10-CM | POA: Diagnosis not present

## 2020-05-19 MED ORDER — ONDANSETRON HCL 4 MG PO TABS
8.0000 mg | ORAL_TABLET | Freq: Once | ORAL | Status: AC
  Administered 2020-05-19: 8 mg via ORAL
  Filled 2020-05-19: qty 2

## 2020-05-19 MED ORDER — AZACITIDINE CHEMO SQ INJECTION
50.0000 mg/m2 | Freq: Once | INTRAMUSCULAR | Status: AC
  Administered 2020-05-19: 132.5 mg via SUBCUTANEOUS
  Filled 2020-05-19: qty 5.3

## 2020-05-20 ENCOUNTER — Other Ambulatory Visit: Payer: Self-pay

## 2020-05-20 ENCOUNTER — Inpatient Hospital Stay: Payer: Medicare Other

## 2020-05-20 VITALS — BP 130/69 | HR 88 | Temp 98.3°F

## 2020-05-20 DIAGNOSIS — Z5111 Encounter for antineoplastic chemotherapy: Secondary | ICD-10-CM | POA: Diagnosis not present

## 2020-05-20 DIAGNOSIS — C9310 Chronic myelomonocytic leukemia not having achieved remission: Secondary | ICD-10-CM

## 2020-05-20 DIAGNOSIS — Z79899 Other long term (current) drug therapy: Secondary | ICD-10-CM | POA: Diagnosis not present

## 2020-05-20 MED ORDER — ONDANSETRON HCL 4 MG PO TABS
8.0000 mg | ORAL_TABLET | Freq: Once | ORAL | Status: AC
  Administered 2020-05-20: 8 mg via ORAL
  Filled 2020-05-20: qty 2

## 2020-05-20 MED ORDER — AZACITIDINE CHEMO SQ INJECTION
50.0000 mg/m2 | Freq: Once | INTRAMUSCULAR | Status: AC
  Administered 2020-05-20: 132.5 mg via SUBCUTANEOUS
  Filled 2020-05-20: qty 5.3

## 2020-05-21 ENCOUNTER — Other Ambulatory Visit: Payer: Self-pay

## 2020-05-21 ENCOUNTER — Inpatient Hospital Stay: Payer: Medicare Other

## 2020-05-21 VITALS — BP 129/75 | HR 94 | Temp 96.6°F | Resp 20

## 2020-05-21 DIAGNOSIS — C9310 Chronic myelomonocytic leukemia not having achieved remission: Secondary | ICD-10-CM | POA: Diagnosis not present

## 2020-05-21 DIAGNOSIS — Z5111 Encounter for antineoplastic chemotherapy: Secondary | ICD-10-CM | POA: Diagnosis not present

## 2020-05-21 DIAGNOSIS — Z79899 Other long term (current) drug therapy: Secondary | ICD-10-CM | POA: Diagnosis not present

## 2020-05-21 MED ORDER — AZACITIDINE CHEMO SQ INJECTION
50.0000 mg/m2 | Freq: Once | INTRAMUSCULAR | Status: AC
  Administered 2020-05-21: 132.5 mg via SUBCUTANEOUS
  Filled 2020-05-21: qty 5.3

## 2020-05-21 MED ORDER — ONDANSETRON HCL 4 MG PO TABS
8.0000 mg | ORAL_TABLET | Freq: Once | ORAL | Status: AC
  Administered 2020-05-21: 8 mg via ORAL
  Filled 2020-05-21: qty 2

## 2020-05-30 ENCOUNTER — Other Ambulatory Visit: Payer: Self-pay | Admitting: Oncology

## 2020-06-07 ENCOUNTER — Telehealth: Payer: Self-pay | Admitting: Internal Medicine

## 2020-06-07 NOTE — Progress Notes (Signed)
  Chronic Care Management   Outreach Note  06/07/2020 Name: Samuel Frank. MRN: [id number] DOB: Apr 24, 1940  Referred by: Lavera Guise, MD Reason for referral : No chief complaint on file.   An unsuccessful telephone outreach was attempted today. The patient was referred to the pharmacist for assistance with care management and care coordination.   Follow Up Plan:   Carley Perdue [employer] Scheduler

## 2020-06-07 NOTE — Progress Notes (Signed)
  Chronic Care Management   Note  06/07/2020 Name: Samuel Frank. MRN: [id number] DOB: Jun 19, 1940  Samuel Frank. is a 80 y.o. year old male who is a primary care patient of Lavera Guise, MD. I reached out to Samuel Frank. by phone today in response to a referral sent by Mr. IOANNIS SCHUH Jr.'s PCP, Lavera Guise, MD.   [name] was given information about Chronic Care Management services today including:  1. CCM service includes personalized support from designated clinical staff supervised by his physician, including individualized plan of care and coordination with other care providers 2. 24/7 contact phone numbers for assistance for urgent and routine care needs. 3. Service will only be billed when office clinical staff spend 20 minutes or more in a month to coordinate care. 4. Only one practitioner may furnish and bill the service in a calendar month. 5. The patient may stop CCM services at any time (effective at the end of the month) by phone call to the office staff.   Patient agreed to services and verbal consent obtained.   Follow up plan:   Carley Perdue [employer] Scheduler

## 2020-06-14 ENCOUNTER — Encounter: Payer: Self-pay | Admitting: Oncology

## 2020-06-14 ENCOUNTER — Inpatient Hospital Stay: Payer: Medicare Other

## 2020-06-14 ENCOUNTER — Inpatient Hospital Stay: Payer: Medicare Other | Attending: Oncology

## 2020-06-14 ENCOUNTER — Inpatient Hospital Stay (HOSPITAL_BASED_OUTPATIENT_CLINIC_OR_DEPARTMENT_OTHER): Payer: Medicare Other | Admitting: Oncology

## 2020-06-14 VITALS — BP 123/67 | HR 91 | Temp 98.0°F | Resp 16 | Ht 74.5 in | Wt 308.0 lb

## 2020-06-14 DIAGNOSIS — C9311 Chronic myelomonocytic leukemia, in remission: Secondary | ICD-10-CM | POA: Diagnosis not present

## 2020-06-14 DIAGNOSIS — C9310 Chronic myelomonocytic leukemia not having achieved remission: Secondary | ICD-10-CM

## 2020-06-14 DIAGNOSIS — Z5111 Encounter for antineoplastic chemotherapy: Secondary | ICD-10-CM

## 2020-06-14 DIAGNOSIS — Z79899 Other long term (current) drug therapy: Secondary | ICD-10-CM | POA: Diagnosis not present

## 2020-06-14 LAB — COMPREHENSIVE METABOLIC PANEL
ALT: 13 U/L (ref 0–44)
AST: 14 U/L — ABNORMAL LOW (ref 15–41)
Albumin: 4 g/dL (ref 3.5–5.0)
Alkaline Phosphatase: 80 U/L (ref 38–126)
Anion gap: 7 (ref 5–15)
BUN: 11 mg/dL (ref 8–23)
CO2: 27 mmol/L (ref 22–32)
Calcium: 8.8 mg/dL — ABNORMAL LOW (ref 8.9–10.3)
Chloride: 102 mmol/L (ref 98–111)
Creatinine, Ser: 0.91 mg/dL (ref 0.61–1.24)
GFR, Estimated: >60 mL/min (ref >60)
Glucose, Bld: 112 mg/dL — ABNORMAL HIGH (ref 70–99)
Potassium: 3.6 mmol/L (ref 3.5–5.1)
Sodium: 136 mmol/L (ref 135–145)
Total Bilirubin: 0.8 mg/dL (ref 0.3–1.2)
Total Protein: 7.5 g/dL (ref 6.5–8.1)

## 2020-06-14 LAB — CBC WITH DIFFERENTIAL/PLATELET
Abs Immature Granulocytes: 0.6 10*3/uL — ABNORMAL HIGH (ref 0.00–0.07)
Basophils Absolute: 0.2 10*3/uL — ABNORMAL HIGH (ref 0.0–0.1)
Basophils Relative: 1 %
Eosinophils Absolute: 0.2 10*3/uL (ref 0.0–0.5)
Eosinophils Relative: 2 %
HCT: 44.1 % (ref 39.0–52.0)
Hemoglobin: 14.1 g/dL (ref 13.0–17.0)
Immature Granulocytes: 4 %
Lymphocytes Relative: 13 %
Lymphs Abs: 2 10*3/uL (ref 0.7–4.0)
MCH: 26.6 pg (ref 26.0–34.0)
MCHC: 32 g/dL (ref 30.0–36.0)
MCV: 83.2 fL (ref 80.0–100.0)
Monocytes Absolute: 1.8 10*3/uL — ABNORMAL HIGH (ref 0.1–1.0)
Monocytes Relative: 12 %
Neutro Abs: 10.6 10*3/uL — ABNORMAL HIGH (ref 1.7–7.7)
Neutrophils Relative %: 68 %
Platelets: 205 10*3/uL (ref 150–400)
RBC: 5.3 MIL/uL (ref 4.22–5.81)
RDW: 23 % — ABNORMAL HIGH (ref 11.5–15.5)
Smear Review: ADEQUATE
WBC: 15.4 10*3/uL — ABNORMAL HIGH (ref 4.0–10.5)
nRBC: 0.3 % — ABNORMAL HIGH (ref 0.0–0.2)

## 2020-06-14 MED ORDER — AZACITIDINE CHEMO SQ INJECTION
50.0000 mg/m2 | Freq: Once | INTRAMUSCULAR | Status: AC
  Administered 2020-06-14: 132.5 mg via SUBCUTANEOUS
  Filled 2020-06-14: qty 5.3

## 2020-06-14 MED ORDER — POTASSIUM CHLORIDE CRYS ER 20 MEQ PO TBCR: 20.0000 meq | EXTENDED_RELEASE_TABLET | Freq: Two times a day (BID) | ORAL | 1 refills | Status: DC

## 2020-06-14 MED ORDER — ONDANSETRON HCL 4 MG PO TABS
8.0000 mg | ORAL_TABLET | Freq: Once | ORAL | Status: AC
  Administered 2020-06-14: 8 mg via ORAL
  Filled 2020-06-14: qty 2

## 2020-06-14 NOTE — Addendum Note (Signed)
Addended by: Luella Cook on: 06/14/2020 05:32 PM   Modules accepted: Orders

## 2020-06-14 NOTE — Progress Notes (Signed)
Pt doing good, no concerns. °

## 2020-06-14 NOTE — Progress Notes (Signed)
Hematology/Oncology Consult note [facility]  [phone] [phone]  Patient Care Team: Lavera Guise, MD as PCP - General (Internal Medicine) Sindy Guadeloupe, MD as Consulting Physician (Oncology) Edythe Clarity, [facility] as Pharmacist (Pharmacist)   Name of the patient: Samuel Frank  [id number]  Sep 20, 1940   Date of visit: 06/14/20  Diagnosis- CMML 1  Chief complaint/ Reason for visit-on treatment assessment prior to next cycle of Vidaza  Heme/Onc history: Patient is a80yrold male with no significant co morbidities other than hypertension. He has recently been having bilateral knee pain and has undergone steroid injection in his knee.Patient was noted to have a high white count on his routine exam. His white count was 19.2 on 07/22/2018 with an H&H of 11.4/35.1 and a platelet count of 470. At that point he was prescribed a course of antibiotics and a repeat blood work on 08/21/2018 showed white count of 42.3, H&H of 7.1/23 with an MCV of 84 and a platelet count of 791 and hence the patient was referred to hematology for further management.  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 In addition the core biopsy showed a small focus of fibrosis containing scattering of mast cells and eosinophils most suggestive of systemic mastocytosis.bone marrow blasts 7%.Flow cytometry showed increased number of monocytic cells representing 16% of all cells with expression for HLA-DR, CD11b, CD13, CD14, CD33, CD38, CD56 and CD56 and CD64 with [facility] CD 117 are CD34.normal cytogenetics, CBL, SRSF2, SH2B3 and TET2  Repeat bone marrow biopsy after 4 cycles of Vidaza showed less pronounced monocytic/granulocytic proliferation and overall better granulopoiesis and no increase in blasts. In addition features of systemic mastocytosis not present.  After 9 cycles of Vidaza patient was noted to have  significant anemia and worsening thrombocytosis for which a repeat bone marrow biopsy was done. Bone marrow did not show any features of progression of CMML. Overall stable findings and no increased blast.He was found to have iron deficiency and received 2 doses of Feraheme.  [facility] EGD and colonoscopy for iron deficiency anemia.Colonoscopy showed nonbleeding internal hemorrhoids. Normal mucosa in the colon. No polyps EGD showed normal esophagus and gastric mucosal atrophy. No stigmata of bleeding   Interval history-patient reports doing well and denies any complaints at this time.  He is active for his age and mows his yard for himself as well as others  ECOG PS- 1 Pain scale- 0   Review of systems- Review of Systems  Constitutional: Negative for chills, fever, malaise/fatigue and weight loss.  HENT: Negative for congestion, ear discharge and nosebleeds.   Eyes: Negative for blurred vision.  Respiratory: Negative for cough, hemoptysis, sputum production, shortness of breath and wheezing.   Cardiovascular: Negative for chest pain, palpitations, orthopnea and claudication.  Gastrointestinal: Negative for abdominal pain, blood in stool, constipation, diarrhea, heartburn, melena, nausea and vomiting.  Genitourinary: Negative for dysuria, flank pain, frequency, hematuria and urgency.  Musculoskeletal: Negative for back pain, joint pain and myalgias.  Skin: Negative for rash.  Neurological: Negative for dizziness, tingling, focal weakness, seizures, weakness and headaches.  Endo/Heme/Allergies: Does not bruise/bleed easily.  Psychiatric/Behavioral: Negative for depression and suicidal ideas. The patient does not have insomnia.        No Known Allergies   Past Medical History:  Diagnosis Date  . Anemia   . CMML (chronic myelomonocytic leukemia) ([facility]   . [facility] ([facility]omonocytic leukemia) ([facility]   .  Dyspnea   . Hypertension      Past  Surgical History:  Procedure Laterality Date  . COLONOSCOPY N/A 08/28/2019   Procedure: COLONOSCOPY;  Surgeon: Toledo, Benay Pike, MD;  Location: [facility] ENDOSCOPY;  Service: Gastroenterology;  Laterality: N/A;  . COLONOSCOPY WITH PROPOFOL N/A 05/03/2015   Procedure: COLONOSCOPY WITH PROPOFOL;  Surgeon: [name], MD;  Location[facility] ENDOSCOPY;  Service: Gastroenterology;  Laterality: N/A;  . ESOPHAGOGASTRODUODENOSCOPY N/A 08/28/2019   Procedure: ESOPHAGOGASTRODUODENOSCOPY (EGD);  Surgeon: Toledo, Benay Pike, MD;  Location: [facility] ENDOSCOPY;  Service: Gastroenterology;  Laterality: N/A;  . ESOPHAGOGASTRODUODENOSCOPY (EGD) WITH PROPOFOL N/A 05/03/2015   Procedure: ESOPHAGOGASTRODUODENOSCOPY (EGD) WITH PROPOFOL;  Surgeon: [name], MD;  Location: [facility] ENDOSCOPY;  Service: Gastroenterology;  Laterality: N/A;  . HERNIA REPAIR     1975    Social History   Socioeconomic History  . Marital status: Divorced    Spouse name: Not on file  . Number of children: 4  . Years of education: Not on file  . Highest education level: Not on file  Occupational History  . Occupation: retired    Comment: [employer]  Tobacco Use  . Smoking status: Never Smoker  . Smokeless tobacco: Never Used  Vaping Use  . Vaping Use: Never used  Substance and Sexual Activity  . Alcohol use: No  . Drug use: No  . Sexual activity: Not Currently  Other Topics Concern  . Not on file  Social History Narrative  . Not on file   Social Determinants of Health   Financial Resource Strain: Not on file  Food Insecurity: Not on file  Transportation Needs: Not on file  Physical Activity: Not on file  Stress: Not on file  Social Connections: Not on file  Intimate Partner Violence: Not on file    Family History  Problem Relation Age of Onset  . Alzheimer's disease Father   . Lung cancer Sister   . Cancer Sister      Current Outpatient Medications:  .  allopurinol (ZYLOPRIM) 300 MG tablet, TAKE 1 TABLET BY  MOUTH EVERY DAY, Disp: 90 tablet, Rfl: 1 .  aspirin EC 81 MG tablet, Take 81 mg by mouth daily., Disp: , Rfl:  .  hydrochlorothiazide (MICROZIDE) 12.5 MG capsule, Take 1 capsule (12.5 mg total) by mouth daily., Disp: 90 capsule, Rfl: 1 .  KLOR-CON M20 20 MEQ tablet, TAKE 1 TABLET BY MOUTH TWICE A DAY, Disp: 180 tablet, Rfl: 1 .  pantoprazole (PROTONIX) 40 MG tablet, Take 1 tablet (40 mg total) by mouth daily., Disp: 90 tablet, Rfl: 1 .  valACYclovir (VALTREX) 500 MG tablet, TAKE 1 TABLET BY MOUTH EVERY DAY, Disp: 90 tablet, Rfl: 1  Physical exam:  Vitals:   06/14/20 1303  BP: 123/67  Pulse: 91  Resp: 16  Temp: 98 F (36.7 C)  TempSrc: Oral  Weight: (!) 308 lb (139.7 kg)  Height: 6' 2.5" (1.892 m)   Physical Exam Constitutional:      General: He is not in acute distress. Cardiovascular:     Rate and Rhythm: Normal rate and regular rhythm.     Heart sounds: Normal heart sounds.  Pulmonary:     Effort: Pulmonary effort is normal.     Breath sounds: Normal breath sounds.  Skin:    General: Skin is warm and dry.  Neurological:     Mental Status: He is alert and oriented to person, place, and time.      CMP Latest Ref  Rng & Units 06/14/2020  Glucose 70 - 99 mg/dL 112(H)  BUN 8 - 23 mg/dL 11  Creatinine 0.61 - 1.24 mg/dL 0.91  Sodium 135 - 145 mmol/L 136  Potassium 3.5 - 5.1 mmol/L 3.6  Chloride 98 - 111 mmol/L 102  CO2 22 - 32 mmol/L 27  Calcium 8.9 - 10.3 mg/dL 8.8(L)  Total Protein 6.5 - 8.1 g/dL 7.5  Total Bilirubin 0.3 - 1.2 mg/dL 0.8  Alkaline Phos 38 - 126 U/L 80  AST 15 - 41 U/L 14(L)  ALT 0 - 44 U/L 13   CBC Latest Ref Rng & Units 06/14/2020  WBC 4.0 - 10.5 K/uL 15.4(H)  Hemoglobin 13.0 - 17.0 g/dL 14.1  Hematocrit 39.0 - 52.0 % 44.1  Platelets 150 - 400 K/uL 205     Assessment and plan- Patient is a 80 y.o. male with CMML 1 here for on treatment assessment prior to next cycle of Vidaza.    Counts okay to proceed with cycle 22 of Vidaza today.  I will see  him back in 4 weeks for cycle 23.  Plan is to continue Vidaza until progression or toxicity.  Hemoglobin is presently stable around 14.  Platelet counts are normal today.  Is tolerating treatment well without any significant side effects.   Visit Diagnosis 1. Encounter for antineoplastic chemotherapy   2. Chronic myelomonocytic leukemia in remission [facility])      Dr. Randa Evens, MD, MPH [facility] at [facility] [phone] 06/14/2020 2:32 PM

## 2020-06-15 ENCOUNTER — Inpatient Hospital Stay: Payer: Medicare Other

## 2020-06-15 VITALS — BP 119/65 | HR 93 | Temp 96.8°F | Resp 20

## 2020-06-15 DIAGNOSIS — Z5111 Encounter for antineoplastic chemotherapy: Secondary | ICD-10-CM | POA: Diagnosis not present

## 2020-06-15 DIAGNOSIS — C9310 Chronic myelomonocytic leukemia not having achieved remission: Secondary | ICD-10-CM

## 2020-06-15 DIAGNOSIS — Z79899 Other long term (current) drug therapy: Secondary | ICD-10-CM | POA: Diagnosis not present

## 2020-06-15 DIAGNOSIS — C9311 Chronic myelomonocytic leukemia, in remission: Secondary | ICD-10-CM | POA: Diagnosis not present

## 2020-06-15 MED ORDER — ONDANSETRON HCL 4 MG PO TABS
8.0000 mg | ORAL_TABLET | Freq: Once | ORAL | Status: AC
  Administered 2020-06-15: 8 mg via ORAL
  Filled 2020-06-15: qty 2

## 2020-06-15 MED ORDER — AZACITIDINE CHEMO SQ INJECTION
50.0000 mg/m2 | Freq: Once | INTRAMUSCULAR | Status: AC
  Administered 2020-06-15: 132.5 mg via SUBCUTANEOUS
  Filled 2020-06-15: qty 5.3

## 2020-06-16 ENCOUNTER — Inpatient Hospital Stay: Payer: Medicare Other

## 2020-06-16 ENCOUNTER — Other Ambulatory Visit: Payer: Self-pay

## 2020-06-16 VITALS — BP 122/62 | HR 82 | Temp 97.3°F

## 2020-06-16 DIAGNOSIS — C9311 Chronic myelomonocytic leukemia, in remission: Secondary | ICD-10-CM | POA: Diagnosis not present

## 2020-06-16 DIAGNOSIS — Z5111 Encounter for antineoplastic chemotherapy: Secondary | ICD-10-CM | POA: Diagnosis not present

## 2020-06-16 DIAGNOSIS — Z79899 Other long term (current) drug therapy: Secondary | ICD-10-CM | POA: Diagnosis not present

## 2020-06-16 DIAGNOSIS — C9310 Chronic myelomonocytic leukemia not having achieved remission: Secondary | ICD-10-CM

## 2020-06-16 MED ORDER — ONDANSETRON HCL 4 MG PO TABS
8.0000 mg | ORAL_TABLET | Freq: Once | ORAL | Status: AC
  Administered 2020-06-16: 8 mg via ORAL
  Filled 2020-06-16: qty 2

## 2020-06-16 MED ORDER — AZACITIDINE CHEMO SQ INJECTION
50.0000 mg/m2 | Freq: Once | INTRAMUSCULAR | Status: AC
  Administered 2020-06-16: 132.5 mg via SUBCUTANEOUS
  Filled 2020-06-16: qty 5.3

## 2020-06-17 ENCOUNTER — Inpatient Hospital Stay: Payer: Medicare Other

## 2020-06-17 VITALS — BP 103/77 | HR 92 | Temp 97.9°F | Resp 16

## 2020-06-17 DIAGNOSIS — C9311 Chronic myelomonocytic leukemia, in remission: Secondary | ICD-10-CM | POA: Diagnosis not present

## 2020-06-17 DIAGNOSIS — C9310 Chronic myelomonocytic leukemia not having achieved remission: Secondary | ICD-10-CM

## 2020-06-17 DIAGNOSIS — Z79899 Other long term (current) drug therapy: Secondary | ICD-10-CM | POA: Diagnosis not present

## 2020-06-17 DIAGNOSIS — Z5111 Encounter for antineoplastic chemotherapy: Secondary | ICD-10-CM | POA: Diagnosis not present

## 2020-06-17 MED ORDER — AZACITIDINE CHEMO SQ INJECTION
50.0000 mg/m2 | Freq: Once | INTRAMUSCULAR | Status: AC
  Administered 2020-06-17: 132.5 mg via SUBCUTANEOUS
  Filled 2020-06-17: qty 5.3

## 2020-06-17 MED ORDER — ONDANSETRON HCL 4 MG PO TABS
8.0000 mg | ORAL_TABLET | Freq: Once | ORAL | Status: AC
  Administered 2020-06-17: 8 mg via ORAL
  Filled 2020-06-17: qty 2

## 2020-06-18 ENCOUNTER — Other Ambulatory Visit: Payer: Self-pay

## 2020-06-18 ENCOUNTER — Inpatient Hospital Stay: Payer: Medicare Other

## 2020-06-18 VITALS — BP 113/68 | HR 94 | Temp 97.8°F | Resp 18

## 2020-06-18 DIAGNOSIS — C9310 Chronic myelomonocytic leukemia not having achieved remission: Secondary | ICD-10-CM

## 2020-06-18 DIAGNOSIS — C9311 Chronic myelomonocytic leukemia, in remission: Secondary | ICD-10-CM | POA: Diagnosis not present

## 2020-06-18 DIAGNOSIS — Z79899 Other long term (current) drug therapy: Secondary | ICD-10-CM | POA: Diagnosis not present

## 2020-06-18 DIAGNOSIS — Z5111 Encounter for antineoplastic chemotherapy: Secondary | ICD-10-CM | POA: Diagnosis not present

## 2020-06-18 MED ORDER — AZACITIDINE CHEMO SQ INJECTION
50.0000 mg/m2 | Freq: Once | INTRAMUSCULAR | Status: AC
  Administered 2020-06-18: 132.5 mg via SUBCUTANEOUS
  Filled 2020-06-18: qty 5.3

## 2020-06-18 MED ORDER — ONDANSETRON HCL 4 MG PO TABS
8.0000 mg | ORAL_TABLET | Freq: Once | ORAL | Status: AC
  Administered 2020-06-18: 8 mg via ORAL
  Filled 2020-06-18: qty 2

## 2020-07-12 ENCOUNTER — Inpatient Hospital Stay: Payer: Medicare Other

## 2020-07-12 ENCOUNTER — Inpatient Hospital Stay: Payer: Medicare Other | Attending: Oncology

## 2020-07-12 ENCOUNTER — Encounter: Payer: Self-pay | Admitting: Oncology

## 2020-07-12 ENCOUNTER — Inpatient Hospital Stay (HOSPITAL_BASED_OUTPATIENT_CLINIC_OR_DEPARTMENT_OTHER): Payer: Medicare Other | Admitting: Oncology

## 2020-07-12 VITALS — BP 112/70 | HR 95 | Temp 97.9°F | Resp 16 | Ht 74.5 in | Wt 307.9 lb

## 2020-07-12 DIAGNOSIS — Z79899 Other long term (current) drug therapy: Secondary | ICD-10-CM | POA: Diagnosis not present

## 2020-07-12 DIAGNOSIS — C9310 Chronic myelomonocytic leukemia not having achieved remission: Secondary | ICD-10-CM

## 2020-07-12 DIAGNOSIS — C9311 Chronic myelomonocytic leukemia, in remission: Secondary | ICD-10-CM | POA: Insufficient documentation

## 2020-07-12 DIAGNOSIS — Z5111 Encounter for antineoplastic chemotherapy: Secondary | ICD-10-CM | POA: Diagnosis not present

## 2020-07-12 LAB — COMPREHENSIVE METABOLIC PANEL
ALT: 10 U/L (ref 0–44)
AST: 13 U/L — ABNORMAL LOW (ref 15–41)
Albumin: 3.7 g/dL (ref 3.5–5.0)
Alkaline Phosphatase: 75 U/L (ref 38–126)
Anion gap: 9 (ref 5–15)
BUN: 10 mg/dL (ref 8–23)
CO2: 27 mmol/L (ref 22–32)
Calcium: 8.9 mg/dL (ref 8.9–10.3)
Chloride: 99 mmol/L (ref 98–111)
Creatinine, Ser: 0.9 mg/dL (ref 0.61–1.24)
GFR, Estimated: >60 mL/min (ref >60)
Glucose, Bld: 131 mg/dL — ABNORMAL HIGH (ref 70–99)
Potassium: 3.8 mmol/L (ref 3.5–5.1)
Sodium: 135 mmol/L (ref 135–145)
Total Bilirubin: 0.8 mg/dL (ref 0.3–1.2)
Total Protein: 7.3 g/dL (ref 6.5–8.1)

## 2020-07-12 LAB — CBC WITH DIFFERENTIAL/PLATELET
Abs Immature Granulocytes: 0.23 10*3/uL — ABNORMAL HIGH (ref 0.00–0.07)
Basophils Absolute: 0.2 10*3/uL — ABNORMAL HIGH (ref 0.0–0.1)
Basophils Relative: 2 %
Eosinophils Absolute: 0.3 10*3/uL (ref 0.0–0.5)
Eosinophils Relative: 2 %
HCT: 45.2 % (ref 39.0–52.0)
Hemoglobin: 14.4 g/dL (ref 13.0–17.0)
Immature Granulocytes: 2 %
Lymphocytes Relative: 11 %
Lymphs Abs: 1.5 10*3/uL (ref 0.7–4.0)
MCH: 26.6 pg (ref 26.0–34.0)
MCHC: 31.9 g/dL (ref 30.0–36.0)
MCV: 83.5 fL (ref 80.0–100.0)
Monocytes Absolute: 0.8 10*3/uL (ref 0.1–1.0)
Monocytes Relative: 6 %
Neutro Abs: 9.9 10*3/uL — ABNORMAL HIGH (ref 1.7–7.7)
Neutrophils Relative %: 77 %
Platelets: 433 10*3/uL — ABNORMAL HIGH (ref 150–400)
RBC: 5.41 MIL/uL (ref 4.22–5.81)
RDW: 20.5 % — ABNORMAL HIGH (ref 11.5–15.5)
Smear Review: ADEQUATE
WBC: 12.9 10*3/uL — ABNORMAL HIGH (ref 4.0–10.5)
nRBC: 0 % (ref 0.0–0.2)

## 2020-07-12 MED ORDER — AZACITIDINE CHEMO SQ INJECTION
50.0000 mg/m2 | Freq: Once | INTRAMUSCULAR | Status: AC
  Administered 2020-07-12: 132.5 mg via SUBCUTANEOUS
  Filled 2020-07-12: qty 5.3

## 2020-07-12 MED ORDER — TRIAMCINOLONE ACETONIDE 0.5 % EX OINT: 1.0000 "application " | TOPICAL_OINTMENT | Freq: Two times a day (BID) | CUTANEOUS | 0 refills | Status: DC

## 2020-07-12 MED ORDER — ONDANSETRON HCL 4 MG PO TABS
8.0000 mg | ORAL_TABLET | Freq: Once | ORAL | Status: AC
Start: 2020-07-12 — End: 2020-07-12
  Administered 2020-07-12: 8 mg via ORAL
  Filled 2020-07-12: qty 2

## 2020-07-12 MED ORDER — TRAZODONE HCL 50 MG PO TABS: 50.0000 mg | ORAL_TABLET | Freq: Every day | ORAL | 1 refills | Status: DC

## 2020-07-12 NOTE — Patient Instructions (Signed)
[facility]  Discharge Instructions: Thank you for choosing [facility] to provide your oncology and hematology care.  If you have a lab appointment with the [facility], please go directly to the [facility] and check in at the registration area.  Wear comfortable clothing and clothing appropriate for easy access to any Portacath or PICC line.   We strive to give you quality time with your provider. You may need to reschedule your appointment if you arrive late (15 or more minutes).  Arriving late affects you and other patients whose appointments are after yours.  Also, if you miss three or more appointments without notifying the office, you may be dismissed from the clinic at the provider's discretion.      For prescription refill requests, have your pharmacy contact our office and allow 72 hours for refills to be completed.    Today you received the following chemotherapy and/or immunotherapy agents Vidaza      To help prevent nausea and vomiting after your treatment, we encourage you to take your nausea medication as directed.  BELOW ARE SYMPTOMS THAT SHOULD BE REPORTED IMMEDIATELY: . *FEVER GREATER THAN 100.4 F (38 C) OR HIGHER . *CHILLS OR SWEATING . *NAUSEA AND VOMITING THAT IS NOT CONTROLLED WITH YOUR NAUSEA MEDICATION . *UNUSUAL SHORTNESS OF BREATH . *UNUSUAL BRUISING OR BLEEDING . *URINARY PROBLEMS (pain or burning when urinating, or frequent urination) . *BOWEL PROBLEMS (unusual diarrhea, constipation, pain near the anus) . TENDERNESS IN MOUTH AND THROAT WITH OR WITHOUT PRESENCE OF ULCERS (sore throat, sores in mouth, or a toothache) . UNUSUAL RASH, SWELLING OR PAIN  . UNUSUAL VAGINAL DISCHARGE OR ITCHING   Items with * indicate a potential emergency and should be followed up as soon as possible or go to the Emergency Department if any problems should occur.  Please show the CHEMOTHERAPY ALERT CARD or IMMUNOTHERAPY ALERT  CARD at check-in to the Emergency Department and triage nurse.  Should you have questions after your visit or need to cancel or reschedule your appointment, please contact [facility]  [phone] and follow the prompts.  Office hours are 8:00 a.m. to 4:30 p.m. Monday - Friday. Please note that voicemails left after 4:00 p.m. may not be returned until the following business day.  We are closed weekends and major holidays. You have access to a nurse at all times for urgent questions. Please call the main number to the clinic [phone] and follow the prompts.  For any non-urgent questions, you may also contact your provider using MyChart. We now offer e-Visits for anyone 55 and older to request care online for non-urgent symptoms. For details visit [email or web address].   Also download the MyChart app! Go to the app store, search "MyChart", open the app, select [facility], and log in with your MyChart username and password.  Due to Covid, a mask is required upon entering the hospital/clinic. If you do not have a mask, one will be given to you upon arrival. For doctor visits, patients may have 1 support person aged 26 or older with them. For treatment visits, patients cannot have anyone with them due to current Covid guidelines and our immunocompromised population. Azacitidine suspension for injection (subcutaneous use) What is this medicine? AZACITIDINE (ay [place]) is a chemotherapy drug. This medicine reduces the growth of cancer cells and can suppress the immune system. It is used for treating myelodysplastic syndrome or some types of leukemia. This  medicine may be used for other purposes; ask your health care provider or pharmacist if you have questions. COMMON BRAND NAME(S): Vidaza What should I tell my health care provider before I take this medicine? They need to know if you have any of these conditions:  kidney disease  liver disease  liver  tumors  an unusual or allergic reaction to azacitidine, mannitol, other medicines, foods, dyes, or preservatives  pregnant or trying to get pregnant  breast-feeding How should I use this medicine? This medicine is for injection under the skin. It is administered in a hospital or clinic by a specially trained health care professional. Talk to your pediatrician regarding the use of this medicine in children. While this drug may be prescribed for selected conditions, precautions do apply. Overdosage: If you think you have taken too much of this medicine contact a poison control center or emergency room at once. NOTE: This medicine is only for you. Do not share this medicine with others. What if I miss a dose? It is important not to miss your dose. Call your doctor or health care professional if you are unable to keep an appointment. What may interact with this medicine? Interactions have not been studied. Give your health care provider a list of all the medicines, herbs, non-prescription drugs, or dietary supplements you use. Also tell them if you smoke, drink alcohol, or use illegal drugs. Some items may interact with your medicine. This list may not describe all possible interactions. Give your health care provider a list of all the medicines, herbs, non-prescription drugs, or dietary supplements you use. Also tell them if you smoke, drink alcohol, or use illegal drugs. Some items may interact with your medicine. What should I watch for while using this medicine? Visit your doctor for checks on your progress. This drug may make you feel generally unwell. This is not uncommon, as chemotherapy can affect healthy cells as well as cancer cells. Report any side effects. Continue your course of treatment even though you feel ill unless your doctor tells you to stop. In some cases, you may be given additional medicines to help with side effects. Follow all directions for their use. Call your doctor or  health care professional for advice if you get a fever, chills or sore throat, or other symptoms of a cold or flu. Do not treat yourself. This drug decreases your body's ability to fight infections. Try to avoid being around people who are sick. This medicine may increase your risk to bruise or bleed. Call your doctor or health care professional if you notice any unusual bleeding. You may need blood work done while you are taking this medicine. Do not become pregnant while taking this medicine and for 6 months after the last dose. Women should inform their doctor if they wish to become pregnant or think they might be pregnant. Men should not father a child while taking this medicine and for 3 months after the last dose. There is a potential for serious side effects to an unborn child. Talk to your health care professional or pharmacist for more information. Do not breast-feed an infant while taking this medicine and for 1 week after the last dose. This medicine may interfere with the ability to have a child. Talk with your doctor or health care professional if you are concerned about your fertility. What side effects may I notice from receiving this medicine? Side effects that you should report to your doctor or health care professional as soon  as possible:  allergic reactions like skin rash, itching or hives, swelling of the face, lips, or tongue  low blood counts - this medicine may decrease the number of white blood cells, red blood cells and platelets. You may be at increased risk for infections and bleeding.  signs of infection - fever or chills, cough, sore throat, pain passing urine  signs of decreased platelets or bleeding - bruising, pinpoint red spots on the skin, black, tarry stools, blood in the urine  signs of decreased red blood cells - unusually weak or tired, fainting spells, lightheadedness  signs and symptoms of kidney injury like trouble passing urine or change in the amount of  urine  signs and symptoms of liver injury like dark yellow or brown urine; general ill feeling or flu-like symptoms; light-colored stools; loss of appetite; nausea; right upper belly pain; unusually weak or tired; yellowing of the eyes or skin Side effects that usually do not require medical attention (report to your doctor or health care professional if they continue or are bothersome):  constipation  diarrhea  nausea, vomiting  pain or redness at the injection site  unusually weak or tired This list may not describe all possible side effects. Call your doctor for medical advice about side effects. You may report side effects to FDA at 1-800-FDA-1088. Where should I keep my medicine? This drug is given in a hospital or clinic and will not be stored at home. NOTE: This sheet is a summary. It may not cover all possible information. If you have questions about this medicine, talk to your doctor, pharmacist, or health care provider.  2021 Elsevier/Gold Standard (2016-03-21 14:37:51)

## 2020-07-12 NOTE — Progress Notes (Signed)
Pt has itching on top of is feet . It starts at area where he puts shoe in to ankle area. He also has a spot on left side that itches and scratches it but does not have rash. He also says that he can go to sleep at 10-11 pm and sleep 1-2 hours and the rest of the night he naps and wakes up throughout the night. He has tried benadryl, melatonin and z quil and nothing has helped.

## 2020-07-12 NOTE — Progress Notes (Signed)
Hematology/Oncology Consult note [facility]  Telephone:([phone] [phone]  Patient Care Team: Lavera Guise, MD as PCP - General (Internal Medicine) Sindy Guadeloupe, MD as Consulting Physician (Oncology) Edythe Clarity, [facility] as Pharmacist (Pharmacist)   Name of the patient: Samuel Frank  [id number]  [birth year]   Date of visit: 07/12/20  Diagnosis- CMML 1  Chief complaint/ Reason for visit-on treatment assessment prior to next cycle of Vidaza  Heme/Onc history: Patient is 80yrold male with no significant co morbidities other than hypertension. He has recently been having bilateral knee pain and has undergone steroid injection in his knee.Patient was noted to have a high white count on his routine exam. His white count was 19.2 on 07/22/2018 with an H&H of 11.4/35.1 and a platelet count of 470. At that point he was prescribed a course of antibiotics and a repeat blood work on 08/21/2018 showed white count of 42.3, H&H of 7.1/23 with an MCV of 84 and a platelet count of 791 and hence the patient was referred to hematology for further management.  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 In addition the core biopsy showed a small focus of fibrosis containing scattering of mast cells and eosinophils most suggestive of systemic mastocytosis.bone marrow blasts 7%.Flow cytometry showed increased number of monocytic cells representing 16% of all cells with expression for HLA-DR, CD11b, CD13, CD14, CD33, CD38, CD56 and CD56 and CD64 with [facility] CD 117 are CD34.normal cytogenetics, CBL, SRSF2, SH2B3 and TET2  Repeat bone marrow biopsy after 4 cycles of Vidaza showed less pronounced monocytic/granulocytic proliferation and overall better granulopoiesis and no increase in blasts. In addition features of systemic mastocytosis not present.  After 9 cycles of Vidaza patient was noted to have  significant anemia and worsening thrombocytosis for which a repeat bone marrow biopsy was done. Bone marrow did not show any features of progression of CMML. Overall stable findings and no increased blast.He was found to have iron deficiency and received 2 doses of Feraheme.  [facility] underwent EGD and colonoscopy for iron deficiency anemia.Colonoscopy showed nonbleeding internal hemorrhoids. Normal mucosa in the colon. No polyps EGD showed normal esophagus and gastric mucosal atrophy. No stigmata of bleeding   Interval history-patient reports trouble sleeping and over-the-counter melatonin has not been helping.  He also reports itching in his bilateral feet which has been intermittent.  Denies any new detergents or lotions.  ECOG PS- 1 Pain scale- 0   Review of systems- Review of Systems  Skin: Positive for itching.  Psychiatric/Behavioral: The patient has insomnia.       No Known Allergies   Past Medical History:  Diagnosis Date  . Anemia   . [place]   . CM[place]ocytic [place]   . Dyspnea   . Hypertension      Past Surgical History:  Procedure Laterality Date  . COLONOSCOPY N/A 08/28/2019   Procedure: COLONOSCOPY;  Surgeon: Toledo, TBenay Pike MD;  Location: [facility] ENDOSCOPY;  Service: Gastroenterology;  Laterality: N/A;  . COLONOSCOPY WITH PROPOFOL N/A 05/03/2015   Procedure: COLONOSCOPY WITH PROPOFOL;  Surgeon: PHulen Luster MD;  [facility];  Service: Gastroenterology;  Laterality: N/A;  . ESOPHAGOGASTRODUODENOSCOPY N/A 08/28/2019   Procedure: ESOPHAGOGASTRODUODENOSCOPY (EGD);  Surgeon: Toledo, TBenay Pike MD;  Location: [facility] ENDOSCOPY;  Service: Gastroenterology;  Laterality: N/A;  . ESOPHAGOGASTRODUODENOSCOPY (EGD) WITH PROPOFOL N/A 05/03/2015   Procedure: ESOPHAGOGASTRODUODENOSCOPY (  EGD) WITH PROPOFOL;  Surgeon: [name], MD;  Location: [facility] ENDOSCOPY;  Service:  Gastroenterology;  Laterality: N/A;  . HERNIA REPAIR     1975    Social History   Socioeconomic History  . Marital status: Divorced    Spouse name: Not on file  . Number of children: 4  . Years of education: Not on file  . Highest education level: Not on file  Occupational History  . Occupation: retired    Comment: [employer]  Tobacco Use  . Smoking status: Never Smoker  . Smokeless tobacco: Never Used  Vaping Use  . Vaping Use: Never used  Substance and Sexual Activity  . Alcohol use: No  . Drug use: No  . Sexual activity: Not Currently  Other Topics Concern  . Not on file  Social History Narrative  . Not on file   Social Determinants of Health   Financial Resource Strain: Not on file  Food Insecurity: Not on file  Transportation Needs: Not on file  Physical Activity: Not on file  Stress: Not on file  Social Connections: Not on file  Intimate Partner Violence: Not on file    Family History  Problem Relation Age of Onset  . Alzheimer's disease Father   . Lung cancer Sister   . Cancer Sister      Current Outpatient Medications:  .  allopurinol (ZYLOPRIM) 300 MG tablet, TAKE 1 TABLET BY MOUTH EVERY DAY, Disp: 90 tablet, Rfl: 1 .  aspirin EC 81 MG tablet, Take 81 mg by mouth daily., Disp: , Rfl:  .  hydrochlorothiazide (MICROZIDE) 12.5 MG capsule, Take 1 capsule (12.5 mg total) by mouth daily., Disp: 90 capsule, Rfl: 1 .  pantoprazole (PROTONIX) 40 MG tablet, Take 1 tablet (40 mg total) by mouth daily., Disp: 90 tablet, Rfl: 1 .  potassium chloride SA (KLOR-CON M20) 20 MEQ tablet, Take 1 tablet (20 mEq total) by mouth 2 (two) times daily., Disp: 180 tablet, Rfl: 1 .  traZODone (DESYREL) 50 MG tablet, Take 1 tablet (50 mg total) by mouth at bedtime., Disp: 30 tablet, Rfl: 1 .  triamcinolone ointment (KENALOG) 0.5 %, Apply 1 application topically 2 (two) times daily. To the affected areas of feet and left side of stomach, Disp: 30 g, Rfl: 0 .   valACYclovir (VALTREX) 500 MG tablet, TAKE 1 TABLET BY MOUTH EVERY DAY, Disp: 90 tablet, Rfl: 1  Physical exam:  Vitals:   07/12/20 1128  BP: 112/70  Pulse: 95  Resp: 16  Temp: 97.9 F (36.6 C)  TempSrc: Oral  Weight: (!) 307 lb 14.4 oz (139.7 kg)  Height: 6' 2.5" (1.892 m)   Physical Exam Constitutional:      General: He is not in acute distress. Cardiovascular:     Rate and Rhythm: Normal rate and regular rhythm.     Heart sounds: Normal heart sounds.  Pulmonary:     Effort: Pulmonary effort is normal.     Breath sounds: Normal breath sounds.  Abdominal:     General: Bowel sounds are normal.     Palpations: Abdomen is soft.  Skin:    General: Skin is warm and dry.     Comments: Superficial excoriating rash noted over bilateral feet with no significant erythema or ulceration  Neurological:     Mental Status: He is alert and oriented to person, place, and time.      CMP Latest Ref Rng & Units 07/12/2020  Glucose 70 - 99 mg/dL  131(H)  BUN 8 - 23 mg/dL 10  Creatinine 0.61 - 1.24 mg/dL 0.90  Sodium 135 - 145 mmol/L 135  Potassium 3.5 - 5.1 mmol/L 3.8  Chloride 98 - 111 mmol/L 99  CO2 22 - 32 mmol/L 27  Calcium 8.9 - 10.3 mg/dL 8.9  Total Protein 6.5 - 8.1 g/dL 7.3  Total Bilirubin 0.3 - 1.2 mg/dL 0.8  Alkaline Phos 38 - 126 U/L 75  AST 15 - 41 U/L 13(L)  ALT 0 - 44 U/L 10   CBC Latest Ref Rng & Units 07/12/2020  WBC 4.0 - 10.5 K/uL 12.9(H)  Hemoglobin 13.0 - 17.0 g/dL 14.4  Hematocrit 39.0 - 52.0 % 45.2  Platelets 150 - 400 K/uL 433(H)      Assessment and plan- Patient is a 80 y.o. male with CMML 1 here for on treatment assessment prior to next cycle of Vidaza  Patient is tolerating Vidaza well without any significant side effects.  Plan is to continue this until progression or toxicity.  Counts okay to proceed with cycle 23 of Vidaza today.  He will be seen by covering MD/NP in 4 weeks and I will see him back in 8 weeks.  He receives Vidaza day 1 to day 5.  He  has intermittent thrombocytosis during [place] with his platelet counts rising up to 700s and eventually normalizes.  Hemoglobin is presently stable.  Suspect eczematous rash in his bilateral feet for which we will give him topical steroids  Insomnia: Trazodone at 50 mg nightly to be prescribed today   Visit Diagnosis 1. Encounter for antineoplastic chemotherapy   2. Chronic myelomonocytic leukemia in remission [facility])      Dr. Randa Evens, MD, MPH [facility] [phone] 07/12/2020 3:58 PM

## 2020-07-13 ENCOUNTER — Inpatient Hospital Stay: Payer: Medicare Other

## 2020-07-13 ENCOUNTER — Other Ambulatory Visit: Payer: Self-pay

## 2020-07-13 VITALS — BP 119/75 | HR 97 | Temp 97.8°F | Resp 19

## 2020-07-13 DIAGNOSIS — C9310 Chronic myelomonocytic leukemia not having achieved remission: Secondary | ICD-10-CM

## 2020-07-13 DIAGNOSIS — Z79899 Other long term (current) drug therapy: Secondary | ICD-10-CM | POA: Diagnosis not present

## 2020-07-13 DIAGNOSIS — C9311 Chronic myelomonocytic leukemia, in remission: Secondary | ICD-10-CM | POA: Diagnosis not present

## 2020-07-13 DIAGNOSIS — Z5111 Encounter for antineoplastic chemotherapy: Secondary | ICD-10-CM | POA: Diagnosis not present

## 2020-07-13 MED ORDER — ONDANSETRON HCL 4 MG PO TABS
8.0000 mg | ORAL_TABLET | Freq: Once | ORAL | Status: AC
Start: 2020-07-13 — End: 2020-07-13
  Administered 2020-07-13: 8 mg via ORAL
  Filled 2020-07-13: qty 2

## 2020-07-13 MED ORDER — AZACITIDINE CHEMO SQ INJECTION
50.0000 mg/m2 | Freq: Once | INTRAMUSCULAR | Status: AC
  Administered 2020-07-13: 132.5 mg via SUBCUTANEOUS
  Filled 2020-07-13: qty 5.3

## 2020-07-14 ENCOUNTER — Telehealth: Payer: Self-pay | Admitting: Pharmacist

## 2020-07-14 ENCOUNTER — Inpatient Hospital Stay: Payer: Medicare Other

## 2020-07-14 VITALS — BP 113/66 | HR 98 | Temp 97.2°F | Resp 16

## 2020-07-14 DIAGNOSIS — C9311 Chronic myelomonocytic leukemia, in remission: Secondary | ICD-10-CM | POA: Diagnosis not present

## 2020-07-14 DIAGNOSIS — Z79899 Other long term (current) drug therapy: Secondary | ICD-10-CM | POA: Diagnosis not present

## 2020-07-14 DIAGNOSIS — Z5111 Encounter for antineoplastic chemotherapy: Secondary | ICD-10-CM | POA: Diagnosis not present

## 2020-07-14 DIAGNOSIS — C9310 Chronic myelomonocytic leukemia not having achieved remission: Secondary | ICD-10-CM

## 2020-07-14 MED ORDER — AZACITIDINE CHEMO SQ INJECTION
50.0000 mg/m2 | Freq: Once | INTRAMUSCULAR | Status: AC
  Administered 2020-07-14: 132.5 mg via SUBCUTANEOUS
  Filled 2020-07-14: qty 5.3

## 2020-07-14 MED ORDER — ONDANSETRON HCL 4 MG PO TABS
8.0000 mg | ORAL_TABLET | Freq: Once | ORAL | Status: AC
  Administered 2020-07-14: 8 mg via ORAL
  Filled 2020-07-14: qty 2

## 2020-07-14 NOTE — Progress Notes (Addendum)
Chronic Care Management Pharmacy Assistant   Name: Samuel Frank.  MRN: [id number] DOB: 1940/12/12  [name]. is an 80 y.o. year old male who presents for his initial CCM visit with the clinical pharmacist.  Reason for Encounter: Chart Prep    Conditions to be addressed/monitored: HTN, GERD, Obesity   Primary concerns for visit include: HTN  Recent office visits:  04/01/20 Luiz Ochoa, NP. For follow-Up. No medication changes.  Recent consult visits:  07/13/20 07/12/20 Oncology Sindy Guadeloupe, MD. For antineoplastic chemotherapy. Per note: Infusion treatment.STARTED Trazdone HCL 50 mg daily at bedtime and Triamcinolone Acetonide 2.5% 1 application Topical 2 times daily, To the affected areas of feet and left side of stomach 06/14/20 Oncology Sindy Guadeloupe, MD. For antineoplastic chemotherapy. CHANGED Potassium Chloride Crys ER to 20 meq 2 times daily. Per note: Infusion treatment.  05/17/20 Oncology Sindy Guadeloupe, MD. For antineoplastic chemotherapy. Per note: Infusion treatment. STOPPED Cyanocobalamin. 04/12/20 Oncology Sindy Guadeloupe, MD. For antineoplastic chemotherapy. Per note: Infusion treatment.  03/15/20 Oncology Sindy Guadeloupe, MD. For Follow-Up. Per note Iron deficiency anemia: Ferritin levels came back low at 12.  He will receive 1 dose of Feraheme this week and second dose next week. 02/26/20 Orthopedic Surgey Reche Dixon, PA and Langston Reusing, MD . For pain in both knees. No medication changes.  02/16/20 Oncology Sindy Guadeloupe, MD. For antineoplastic chemotherapy. Per note: Infusion treatment. No medication changes.  01/19/20 Oncology Sindy Guadeloupe, MD. For antineoplastic chemotherapy. Per note: Infusion treatment. No medication changes.   Hospital visits:  None in the last 6 months.   Medications: Outpatient Encounter Medications as of 07/14/2020  Medication Sig   allopurinol (ZYLOPRIM) 300 MG tablet TAKE 1 TABLET BY MOUTH EVERY DAY   aspirin EC  81 MG tablet Take 81 mg by mouth daily.   hydrochlorothiazide (MICROZIDE) 12.5 MG capsule Take 1 capsule (12.5 mg total) by mouth daily.   pantoprazole (PROTONIX) 40 MG tablet Take 1 tablet (40 mg total) by mouth daily.   potassium chloride SA (KLOR-CON M20) 20 MEQ tablet Take 1 tablet (20 mEq total) by mouth 2 (two) times daily.   traZODone (DESYREL) 50 MG tablet Take 1 tablet (50 mg total) by mouth at bedtime.   triamcinolone ointment (KENALOG) 0.5 % Apply 1 application topically 2 (two) times daily. To the affected areas of feet and left side of stomach   valACYclovir (VALTREX) 500 MG tablet TAKE 1 TABLET BY MOUTH EVERY DAY   No facility-administered encounter medications on file as of 07/14/2020.    Have you seen any other providers since your last visit? Patient stated no.   Any changes in your medications or health? Patient stated no.  Any side effects from any medications? Patient stated no.   Do you have an symptoms or problems not managed by your medications? Patient stated no.   Any concerns about your health right now? Patient stated no.   Has your provider asked that you check blood pressure, blood sugar, or follow special diet at home? Patient stated no.  Do you get any type of exercise on a regular basis? Patient stated he mow yards weekly.   Can you think of a goal you would like to reach for your health? Patient stated no.  Do you have any problems getting your medications? Patient stated no.  Is there anything that you would like to discuss during the appointment? Patient stated no.  Please  bring medications and supplements to appointment, patient reminded of his face to face appointment on 5/17 on 9 am.  Follow-Up:Pharmacist Review   Charlann Lange, [place] Pharmacist Assistant [phone]

## 2020-07-14 NOTE — Patient Instructions (Signed)
C[facility]ER CENTER [place] [facility]  Discharge Instructions: Thank you for choosing [facility] to provide your oncology and hematology care.  If you have a lab appointment with the Cancer Center, please go directly to the Cancer Center and check in at the registration area.  Wear comfortable clothing and clothing appropriate for easy access to any Portacath or PICC line.   We strive to give you quality time with your provider. You may need to reschedule your appointment if you arrive late (15 or more minutes).  Arriving late affects you and other patients whose appointments are after yours.  Also, if you miss three or more appointments without notifying the office, you may be dismissed from the clinic at the provider's discretion.      For prescription refill requests, have your pharmacy contact our office and allow 72 hours for refills to be completed.    Today you received the following chemotherapy and/or immunotherapy agents Vidaza      To help prevent nausea and vomiting after your treatment, we encourage you to take your nausea medication as directed.  BELOW ARE SYMPTOMS THAT SHOULD BE REPORTED IMMEDIATELY: . *FEVER GREATER THAN 100.4 F (38 C) OR HIGHER . *CHILLS OR SWEATING . *NAUSEA AND VOMITING THAT IS NOT CONTROLLED WITH YOUR NAUSEA MEDICATION . *UNUSUAL SHORTNESS OF BREATH . *UNUSUAL BRUISING OR BLEEDING . *URINARY PROBLEMS (pain or burning when urinating, or frequent urination) . *BOWEL PROBLEMS (unusual diarrhea, constipation, pain near the anus) . TENDERNESS IN MOUTH AND THROAT WITH OR WITHOUT PRESENCE OF ULCERS (sore throat, sores in mouth, or a toothache) . UNUSUAL RASH, SWELLING OR PAIN  . UNUSUAL VAGINAL DISCHARGE OR ITCHING   Items with * indicate a potential emergency and should be followed up as soon as possible or go to the Emergency Department if any problems should occur.  Please show the CHEMOTHERAPY ALERT CARD or IMMUNOTHERAPY ALERT  CARD at check-in to the Emergency Department and triage nurse.  Should you have questions after your visit or need to cancel or reschedule your appointment, please contact [facility] C[facility] [place] [facility]  [phone] and follow the prompts.  Office hours are 8:00 a.m. to 4:30 p.m. Monday - Friday. Please note that voicemails left after 4:00 p.m. may not be returned until the following business day.  We are closed weekends and major holidays. You have access to a nurse at all times for urgent questions. Please call the main number to the clinic [phone] and follow the prompts.  For any non-urgent questions, you may also contact your provider using MyChart. We now offer e-Visits for anyone 18 and older to request care online for non-urgent symptoms. For details visit [email or web address].   Also download the MyChart app! Go to the app store, search "MyChart", open the app, select [facility] [place], and log in with your MyChart username and password.  Due to Covid, a mask is required upon entering the hospital/clinic. If you do not have a mask, one will be given to you upon arrival. For doctor visits, patients may have 1 support person aged 18 or older with them. For treatment visits, patients cannot have anyone with them due to current Covid guidelines and our immunocompromised population. Azacitidine suspension for injection (subcutaneous use) What is this medicine? AZACITIDINE (ay za SITE i deen) is a chemotherapy drug. This medicine reduces the growth of cancer cells and can suppress the immune system. It is used for treating myelodysplastic syndrome or some types of leukemia. This   of leukemia. This medicine may be used for other purposes; ask your health care provider or pharmacist if you have questions. COMMON BRAND NAME(S): Vidaza What should I tell my health care provider before I take this medicine? They need to know if you have any of these conditions:  kidney disease  liver  disease  liver tumors  an unusual or allergic reaction to azacitidine, mannitol, other medicines, foods, dyes, or preservatives  pregnant or trying to get pregnant  breast-feeding How should I use this medicine? This medicine is for injection under the skin. It is administered in a hospital or clinic by a specially trained health care professional. Talk to your pediatrician regarding the use of this medicine in children. While this drug may be prescribed for selected conditions, precautions do apply. Overdosage: If you think you have taken too much of this medicine contact a poison control center or emergency room at once. NOTE: This medicine is only for you. Do not share this medicine with others. What if I miss a dose? It is important not to miss your dose. Call your doctor or health care professional if you are unable to keep an appointment. What may interact with this medicine? Interactions have not been studied. Give your health care provider a list of all the medicines, herbs, non-prescription drugs, or dietary supplements you use. Also tell them if you smoke, drink alcohol, or use illegal drugs. Some items may interact with your medicine. This list may not describe all possible interactions. Give your health care provider a list of all the medicines, herbs, non-prescription drugs, or dietary supplements you use. Also tell them if you smoke, drink alcohol, or use illegal drugs. Some items may interact with your medicine. What should I watch for while using this medicine? Visit your doctor for checks on your progress. This drug may make you feel generally unwell. This is not uncommon, as chemotherapy can affect healthy cells as well as cancer cells. Report any side effects. Continue your course of treatment even though you feel ill unless your doctor tells you to stop. In some cases, you may be given additional medicines to help with side effects. Follow all directions for their use. Call  your doctor or health care professional for advice if you get a fever, chills or sore throat, or other symptoms of a cold or flu. Do not treat yourself. This drug decreases your body's ability to fight infections. Try to avoid being around people who are sick. This medicine may increase your risk to bruise or bleed. Call your doctor or health care professional if you notice any unusual bleeding. You may need blood work done while you are taking this medicine. Do not become pregnant while taking this medicine and for 6 months after the last dose. Women should inform their doctor if they wish to become pregnant or think they might be pregnant. Men should not father a child while taking this medicine and for 3 months after the last dose. There is a potential for serious side effects to an unborn child. Talk to your health care professional or pharmacist for more information. Do not breast-feed an infant while taking this medicine and for 1 week after the last dose. This medicine may interfere with the ability to have a child. Talk with your doctor or health care professional if you are concerned about your fertility. What side effects may I notice from receiving this medicine? Side effects that you should report to your doctor or health care  professional as soon as possible:  allergic reactions like skin rash, itching or hives, swelling of the face, lips, or tongue  low blood counts - this medicine may decrease the number of white blood cells, red blood cells and platelets. You may be at increased risk for infections and bleeding.  signs of infection - fever or chills, cough, sore throat, pain passing urine  signs of decreased platelets or bleeding - bruising, pinpoint red spots on the skin, black, tarry stools, blood in the urine  signs of decreased red blood cells - unusually weak or tired, fainting spells, lightheadedness  signs and symptoms of kidney injury like trouble passing urine or change in  the amount of urine  signs and symptoms of liver injury like dark yellow or brown urine; general ill feeling or flu-like symptoms; light-colored stools; loss of appetite; nausea; right upper belly pain; unusually weak or tired; yellowing of the eyes or skin Side effects that usually do not require medical attention (report to your doctor or health care professional if they continue or are bothersome):  constipation  diarrhea  nausea, vomiting  pain or redness at the injection site  unusually weak or tired This list may not describe all possible side effects. Call your doctor for medical advice about side effects. You may report side effects to FDA at 1-800-FDA-1088. Where should I keep my medicine? This drug is given in a hospital or clinic and will not be stored at home. NOTE: This sheet is a summary. It may not cover all possible information. If you have questions about this medicine, talk to your doctor, pharmacist, or health care provider.  2021 Elsevier/Gold Standard (2016-03-21 14:37:51)

## 2020-07-15 ENCOUNTER — Inpatient Hospital Stay: Payer: Medicare Other

## 2020-07-15 ENCOUNTER — Other Ambulatory Visit: Payer: Self-pay

## 2020-07-15 VITALS — BP 112/69 | HR 98 | Temp 97.0°F | Resp 17

## 2020-07-15 DIAGNOSIS — Z5111 Encounter for antineoplastic chemotherapy: Secondary | ICD-10-CM | POA: Diagnosis not present

## 2020-07-15 DIAGNOSIS — C9311 Chronic myelomonocytic leukemia, in remission: Secondary | ICD-10-CM | POA: Diagnosis not present

## 2020-07-15 DIAGNOSIS — Z79899 Other long term (current) drug therapy: Secondary | ICD-10-CM | POA: Diagnosis not present

## 2020-07-15 DIAGNOSIS — C9310 Chronic myelomonocytic leukemia not having achieved remission: Secondary | ICD-10-CM

## 2020-07-15 MED ORDER — AZACITIDINE CHEMO SQ INJECTION
50.0000 mg/m2 | Freq: Once | INTRAMUSCULAR | Status: AC
Start: 2020-07-15 — End: 2020-07-15
  Administered 2020-07-15: 132.5 mg via SUBCUTANEOUS
  Filled 2020-07-15: qty 5.3

## 2020-07-15 MED ORDER — ONDANSETRON HCL 4 MG PO TABS
8.0000 mg | ORAL_TABLET | Freq: Once | ORAL | Status: AC
  Administered 2020-07-15: 8 mg via ORAL
  Filled 2020-07-15: qty 2

## 2020-07-15 NOTE — Patient Instructions (Addendum)
[facility]  Discharge Instructions: Thank you for choosing [facility] to provide your oncology and hematology care.  If you have a lab appointment with the [facility], please go directly to the [facility] and check in at the registration area.  Wear comfortable clothing and clothing appropriate for easy access to any Portacath or PICC line.   We strive to give you quality time with your provider. You may need to reschedule your appointment if you arrive late (15 or more minutes).  Arriving late affects you and other patients whose appointments are after yours.  Also, if you miss three or more appointments without notifying the office, you may be dismissed from the clinic at the provider's discretion.      For prescription refill requests, have your pharmacy contact our office and allow 72 hours for refills to be completed.    Today you received the following chemotherapy and/or immunotherapy agents Vidaza       To help prevent nausea and vomiting after your treatment, we encourage you to take your nausea medication as directed.  BELOW ARE SYMPTOMS THAT SHOULD BE REPORTED IMMEDIATELY: . *FEVER GREATER THAN 100.4 F (38 C) OR HIGHER . *CHILLS OR SWEATING . *NAUSEA AND VOMITING THAT IS NOT CONTROLLED WITH YOUR NAUSEA MEDICATION . *UNUSUAL SHORTNESS OF BREATH . *UNUSUAL BRUISING OR BLEEDING . *URINARY PROBLEMS (pain or burning when urinating, or frequent urination) . *BOWEL PROBLEMS (unusual diarrhea, constipation, pain near the anus) . TENDERNESS IN MOUTH AND THROAT WITH OR WITHOUT PRESENCE OF ULCERS (sore throat, sores in mouth, or a toothache) . UNUSUAL RASH, SWELLING OR PAIN  . UNUSUAL VAGINAL DISCHARGE OR ITCHING   Items with * indicate a potential emergency and should be followed up as soon as possible or go to the Emergency Department if any problems should occur.  Please show the CHEMOTHERAPY ALERT CARD or IMMUNOTHERAPY ALERT  CARD at check-in to the Emergency Department and triage nurse.  Should you have questions after your visit or need to cancel or reschedule your appointment, please contact [facility]  [phone] and follow the prompts.  Office hours are 8:00 a.m. to 4:30 p.m. Monday - Friday. Please note that voicemails left after 4:00 p.m. may not be returned until the following business day.  We are closed weekends and major holidays. You have access to a nurse at all times for urgent questions. Please call the main number to the clinic [phone] and follow the prompts.  For any non-urgent questions, you may also contact your provider using MyChart. We now offer e-Visits for anyone 2 and older to request care online for non-urgent symptoms. For details visit [email or web address].   Also download the MyChart app! Go to the app store, search "MyChart", open the app, select [facility], and log in with your MyChart username and password.  Due to Covid, a mask is required upon entering the hospital/clinic. If you do not have a mask, one will be given to you upon arrival. For doctor visits, patients may have 1 support person aged 52 or older with them. For treatment visits, patients cannot have anyone with them due to current Covid guidelines and our immunocompromised population.

## 2020-07-16 ENCOUNTER — Other Ambulatory Visit: Payer: Self-pay | Admitting: *Deleted

## 2020-07-16 ENCOUNTER — Telehealth: Payer: Self-pay | Admitting: *Deleted

## 2020-07-16 ENCOUNTER — Inpatient Hospital Stay: Payer: Medicare Other

## 2020-07-16 VITALS — BP 146/75 | HR 93 | Temp 97.5°F | Resp 19

## 2020-07-16 DIAGNOSIS — Z79899 Other long term (current) drug therapy: Secondary | ICD-10-CM | POA: Diagnosis not present

## 2020-07-16 DIAGNOSIS — Z5111 Encounter for antineoplastic chemotherapy: Secondary | ICD-10-CM | POA: Diagnosis not present

## 2020-07-16 DIAGNOSIS — C9311 Chronic myelomonocytic leukemia, in remission: Secondary | ICD-10-CM | POA: Diagnosis not present

## 2020-07-16 DIAGNOSIS — C9310 Chronic myelomonocytic leukemia not having achieved remission: Secondary | ICD-10-CM

## 2020-07-16 MED ORDER — AZACITIDINE CHEMO SQ INJECTION
50.0000 mg/m2 | Freq: Once | INTRAMUSCULAR | Status: AC
  Administered 2020-07-16: 132.5 mg via SUBCUTANEOUS
  Filled 2020-07-16: qty 5.3

## 2020-07-16 MED ORDER — ONDANSETRON HCL 4 MG PO TABS
8.0000 mg | ORAL_TABLET | Freq: Once | ORAL | Status: AC
  Administered 2020-07-16: 8 mg via ORAL
  Filled 2020-07-16: qty 2

## 2020-07-16 MED ORDER — FUROSEMIDE 20 MG PO TABS: 20.0000 mg | ORAL_TABLET | Freq: Every day | ORAL | 0 refills | Status: DC

## 2020-07-16 NOTE — Progress Notes (Signed)
Chronic Care Management Pharmacy Note  07/20/2020 Name:  Samuel Frank. MRN:  [id number] DOB:  1940-07-01  Subjective: Samuel Frank. is an 80 y.o. year old male who is a primary patient of Humphrey Rolls, Timoteo Gaul, MD.  The CCM team was consulted for assistance with disease management and care coordination needs.    Engaged with patient by telephone for initial visit in response to provider referral for pharmacy case management and/or care coordination services.   Consent to Services:  The patient was given the following information about Chronic Care Management services today, agreed to services, and gave verbal consent: 1. CCM service includes personalized support from designated clinical staff supervised by the primary care provider, including individualized plan of care and coordination with other care providers 2. 24/7 contact phone numbers for assistance for urgent and routine care needs. 3. Service will only be billed when office clinical staff spend 20 minutes or more in a month to coordinate care. 4. Only one practitioner may furnish and bill the service in a calendar month. 5.The patient may stop CCM services at any time (effective at the end of the month) by phone call to the office staff. 6. The patient will be responsible for cost sharing (co-pay) of up to 20% of the service fee (after annual deductible is met). Patient agreed to services and consent obtained.  Patient Care Team: Lavera Guise, MD as PCP - General (Internal Medicine) Sindy Guadeloupe, MD as Consulting Physician (Oncology) Edythe Clarity, [facility] as Pharmacist (Pharmacist)  Recent office visits: 04/01/20 Luiz Ochoa, NP. For follow-Up. No medication changes.  Recent consult visits: 07/13/20 07/12/20 Oncology Sindy Guadeloupe, MD. For antineoplastic chemotherapy. Per note: Infusion treatment.STARTED Trazdone HCL 50 mg daily at bedtime and Triamcinolone Acetonide 1.5% 1 application Topical 2 times daily, To the affected  areas of feet and left side of stomach 06/14/20 Oncology Sindy Guadeloupe, MD. For antineoplastic chemotherapy. CHANGED Potassium Chloride Crys ER to 20 meq 2 times daily. Per note: Infusion treatment.  05/17/20 Oncology Sindy Guadeloupe, MD. For antineoplastic chemotherapy. Per note: Infusion treatment. STOPPED Cyanocobalamin. 04/12/20 Oncology Sindy Guadeloupe, MD. For antineoplastic chemotherapy. Per note: Infusion treatment.  03/15/20 Oncology Sindy Guadeloupe, MD. For Follow-Up. Per note Iron deficiency anemia: Ferritin levels came back low at 12. He will receive 1 dose of Feraheme this week and second dose next week. 02/26/20 Orthopedic Surgey Reche Dixon, PA and Langston Reusing, MD. For pain in both knees. No medication changes.  02/16/20 Oncology Sindy Guadeloupe, MD. For antineoplastic chemotherapy. Per note: Infusion treatment. No medication changes.  01/19/20 Oncology Sindy Guadeloupe, MD. For antineoplastic chemotherapy. Per note: Infusion treatment. No medication changes.    Hospital visits: None in previous 6 months  Objective:  Lab Results  Component Value Date   CREATININE 0.90 07/12/2020   BUN 10 07/12/2020   GFRNONAA >60 07/12/2020   GFRAA >60 11/24/2019   NA 135 07/12/2020   K 3.8 07/12/2020   CALCIUM 8.9 07/12/2020   CO2 27 07/12/2020   GLUCOSE 131 (H) 07/12/2020    Lab Results  Component Value Date/Time   HGBA1C 6.4 (H) 07/22/2018 08:11 AM    Last diabetic Eye exam: No results found for: HMDIABEYEEXA  Last diabetic Foot exam: No results found for: HMDIABFOOTEX   Lab Results  Component Value Date   CHOL 159 08/05/2019   HDL 31 (L) 08/05/2019   LDLCALC 104 (H) 08/05/2019   TRIG 121  08/05/2019   CHOLHDL 5.1 08/05/2019    Hepatic Function Latest Ref Rng & Units 07/12/2020 06/14/2020 05/17/2020  Total Protein 6.5 - 8.1 g/dL 7.3 7.5 7.6  Albumin 3.5 - 5.0 g/dL 3.7 4.0 3.8  AST 15 - 41 U/L 13(L) 14(L) 13(L)  ALT 0 - 44 U/L '10 13 10  ' Alk Phosphatase 38 - 126 U/L 75 80  83  Total Bilirubin 0.3 - 1.2 mg/dL 0.8 0.8 0.7  Bilirubin, Direct 0.00 - 0.40 mg/dL - - -    Lab Results  Component Value Date/Time   TSH 0.980 08/05/2019 01:55 PM   TSH 0.658 09/03/2018 02:17 PM   TSH 1.420 07/22/2018 08:11 AM   FREET4 0.88 08/05/2019 01:55 PM   FREET4 1.10 07/22/2018 08:11 AM    CBC Latest Ref Rng & Units 07/12/2020 06/14/2020 05/17/2020  WBC 4.0 - 10.5 K/uL 12.9(H) 15.4(H) 8.8  Hemoglobin 13.0 - 17.0 g/dL 14.4 14.1 14.1  Hematocrit 39.0 - 52.0 % 45.2 44.1 46.0  Platelets 150 - 400 K/uL 433(H) 205 743(H)    No results found for: VD25OH  Clinical ASCVD: No  The ASCVD Risk score Mikey Bussing DC Jr., et al., 2013) failed to calculate for the following reasons:   The 2013 ASCVD risk score is only valid for ages 75 to 23    Depression screen PHQ 2/9 04/01/2020 12/01/2019 07/31/2019  Decreased Interest 0 0 0  Down, Depressed, Hopeless 0 0 0  PHQ - 2 Score 0 0 0  Some recent data might be hidden      Social History   Tobacco Use  Smoking Status Never Smoker  Smokeless Tobacco Never Used   BP Readings from Last 3 Encounters:  07/16/20 (!) 146/75  07/15/20 112/69  07/14/20 113/66   Pulse Readings from Last 3 Encounters:  07/16/20 93  07/15/20 98  07/14/20 98   Wt Readings from Last 3 Encounters:  07/12/20 (!) 307 lb 14.4 oz (139.7 kg)  06/14/20 (!) 308 lb (139.7 kg)  05/17/20 (!) 307 lb 4.8 oz (139.4 kg)   BMI Readings from Last 3 Encounters:  07/12/20 39.00 kg/m  06/14/20 39.02 kg/m  05/17/20 38.93 kg/m    Assessment/Interventions: Review of patient past medical history, allergies, medications, health status, including review of consultants reports, laboratory and other test data, was performed as part of comprehensive evaluation and provision of chronic care management services.   SDOH:  (Social Determinants of Health) assessments and interventions performed: Yes   Financial Resource Strain: Low Risk   . Difficulty of Paying Living Expenses: Not  very hard    SDOH Screenings   Alcohol Screen: Not on file  Depression (PHQ2-9): Low Risk   . PHQ-2 Score: 0  Financial Resource Strain: Low Risk   . Difficulty of Paying Living Expenses: Not very hard  Food Insecurity: Not on file  Housing: Not on file  Physical Activity: Not on file  Social Connections: Not on file  Stress: Not on file  Tobacco Use: Low Risk   . Smoking Tobacco Use: Never Smoker  . Smokeless Tobacco Use: Never Used  Transportation Needs: Not on file    [place]  No Known Allergies  Medications Reviewed Today    Reviewed by Edythe Clarity, [facility] (Pharmacist) on 07/20/20 at [phone]  Med List Status: <None>  Medication Order Taking? Sig Documenting Provider Last Dose Status Informant  allopurinol (ZYLOPRIM) 300 MG tablet [id number] Yes TAKE 1 TABLET BY MOUTH EVERY DAY Sindy Guadeloupe, MD Taking Active  aspirin EC 81 MG tablet [id number] Yes Take 81 mg by mouth daily. [provider] Taking Active   furosemide (LASIX) 20 MG tablet [id number] Yes Take 1 tablet (20 mg total) by mouth daily. Sindy Guadeloupe, MD Taking Active   hydrochlorothiazide (MICROZIDE) 12.5 MG capsule [id number]  Take 1 capsule (12.5 mg total) by mouth daily. Sindy Guadeloupe, MD  Expired 05/16/20 2359   pantoprazole (PROTONIX) 40 MG tablet [id number] Yes Take 1 tablet (40 mg total) by mouth daily. Ronnell Freshwater, NP Taking Active   potassium chloride SA (KLOR-CON M20) 20 MEQ tablet [id number] Yes Take 1 tablet (20 mEq total) by mouth 2 (two) times daily. Sindy Guadeloupe, MD Taking Active   traZODone (DESYREL) 50 MG tablet [id number] Yes Take 1 tablet (50 mg total) by mouth at bedtime. Sindy Guadeloupe, MD Taking Active   triamcinolone ointment (KENALOG) 0.5 % [id number] Yes Apply 1 application topically 2 (two) times daily. To the affected areas of feet and left side of stomach Sindy Guadeloupe, MD Taking Active   valACYclovir (VALTREX) 500 MG tablet [id number] Yes TAKE 1 TABLET BY MOUTH EVERY  DAY Sindy Guadeloupe, MD Taking Active           Patient Active Problem List   Diagnosis Date Noted  . Needs flu shot 12/14/2019  . Atherosclerosis of aorta ([place]) 12/01/2019  . Acute upper respiratory infection 05/31/2019  . Iron deficiency anemia 11/25/2018  . 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 Left wrist pain 07/16/2018  . Leukocytosis 03/25/2018  . Acute gouty arthritis 03/25/2018  . Need for vaccination against Streptococcus pneumoniae using pneumococcal conjugate vaccine 13 01/16/2018  . Encounter for general adult medical examination with abnormal findings 07/13/2017  . Dysuria 07/13/2017  . Closed fracture of rib of left side with routine healing 06/24/2017  . Gastroesophageal reflux disease without esophagitis 06/24/2017  . Obesity (BMI 35.0-39.9 without comorbidity) 03/13/2017  . Primary osteoarthritis of right hip 03/13/2017  . Trochanteric bursitis of right hip 03/13/2017  . Other cervical disc degeneration at C5-C6 level 08/31/2016  . Polio 08/31/2016  . Essential hypertension, benign 04/05/2015  . Iron deficiency anemia due to chronic blood loss 04/05/2015    Immunization History  Administered Date(s) Administered  . Fluad Quad(high Dose 65+) 12/30/2018  . Influenza Inj Mdck Quad Pf 12/01/2019  . Influenza, High Dose Seasonal PF 12/04/2016, 11/23/2017  . PFIZER(Purple Top)SARS-COV-2 Vaccination 05/31/2019, 07/01/2019    Conditions to be addressed/monitored:  HTN, GERD, Obesity, Chronic Leukemia  Care Plan : General Pharmacy (Adult)  Updates made by Edythe Clarity, RPH since 07/20/2020 12:00 AM    Problem: HTN, GERD, Obesity, Chronic Leukemia   Priority: High  Onset Date: 07/20/2020    Long-Range Goal: Patient-Specific Goal   Start Date: 07/20/2020  Expected End Date: 01/20/2021  This Visit's Progress: On track  Priority: High  Note:   Current Barriers:  . Bilateral swelling in both feet.  Pharmacist Clinical Goal(s):   [name] Patient will achieve adherence to monitoring guidelines and medication adherence to achieve therapeutic efficacy . maintain control of blood pressure as evidenced by home monitoring  . adhere to prescribed medication regimen as evidenced by fill dates . contact provider office for questions/concerns as evidenced notation of same in electronic health record through collaboration with PharmD and provider.   Interventions: . 1:1 collaboration with Lavera Guise, MD regarding development and update of comprehensive plan of care as evidenced by provider attestation and co-signature .  Inter-disciplinary care team collaboration (see longitudinal plan of care) . Comprehensive medication review performed; medication list updated in electronic medical record  Hypertension (BP goal <140/90) -Controlled -Current treatment: . HCTZ 12.22m daily . Furosemide 22mdaily -Medications previously tried: Valsartan/HCTZ  -Current home readings: "good" no logs present today, reports he checks periodically and it usually runs about what it does in office.  Last office Bps have been excellent  -Current exercise habits: mows yards occasionally, not much outside of that -Denies hypotensive/hypertensive symptoms -Educated on BP goals and benefits of medications for prevention of heart attack, stroke and kidney damage; Importance of home blood pressure monitoring; Symptoms of hypotension and importance of maintaining adequate hydration; -Counseled to monitor BP at home a few time per week, document, and provide log at future appointments -Does have some bilateral swelling in both feet, noticed this about a week ago.  It is not red or painful or warm to the touch. -Was given some Lasix by oncology nurse to take for a short period of time but he has not picked up yet from pharmacy. -Recommended to continue current medication Counseled on elevating feet whenever possible to alleviate swelling. Recommended he [facility]  [facility] Rx from [facility]ix and take a dose in the morning to see if this helps.  Counseled to only take this medication on prn basis.  GERD (Goal: Minimize symptoms) -Controlled -Current treatment  . Pantoprazole 4017maily -Medications previously tried: none noted -Verbalizes correct medication administration -Denies symptoms unless he forgets a dose -Recommended to continue current medication   Leukemia (Goal: Remission) -Controlled -Current treatment  . Chemotherapy  -Medications previously tried: none noted -Patient receiving treatments 5 consecutive days per month -He reports he has never had any symptoms from the cancer or has not had any effect from the chemotherapy up to this point.  -Recommended continue current management strategy   Patient Goals/Self-Care Activities . Patient will:  - take medications as prescribed check blood pressure a few times per week, document, and provide at future appointments Start Lasix on prn basis for swelling.  Follow Up Plan: The care management team will reach out to the patient again over the next 120 days.        Medication Assistance: None required.  Patient affirms current coverage meets needs.  [facility]  [id number] [id number]: [phone]: [phone] pill box? Yes Pt endorses 100% compliance  We discussed: Benefits of medication synchronization, packaging and delivery as well as enhanced pharmacist oversight with [facility]. Patient decided to: Continue current medication management strategy  Care Plan and Follow Up Patient Decision:  Patient agrees to Care Plan and Follow-up.  Plan: The care management team will reach out to the patient again over the next 120 days.  ChriBeverly MilcharmD Clinical Pharmacist [phone]

## 2020-07-16 NOTE — Telephone Encounter (Signed)
Pt wanted me to come see him in infusion and he started having feet and ankle swelling bilateral. He woke up this morning and it did not get better from his elevated over night. Spoke to [employer] and she said to give him lasix 20 mg daily x5. Also to drink fluids and eat banana, backed potato , nuts so his potassium does not drink. Pt take potassium pills now and he will continue it. Pt agreeable to the plan and his med was ent in to pharmacy

## 2020-07-20 ENCOUNTER — Ambulatory Visit: Payer: Medicare Other | Admitting: Pharmacist

## 2020-07-20 ENCOUNTER — Other Ambulatory Visit: Payer: Self-pay

## 2020-07-20 DIAGNOSIS — K219 Gastro-esophageal reflux disease without esophagitis: Secondary | ICD-10-CM

## 2020-07-20 DIAGNOSIS — I10 Essential (primary) hypertension: Secondary | ICD-10-CM

## 2020-07-20 DIAGNOSIS — C9311 Chronic myelomonocytic leukemia, in remission: Secondary | ICD-10-CM

## 2020-07-20 NOTE — Patient Instructions (Addendum)
Visit Information  Goals Addressed            This Visit's Progress   . 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    Following your treatment plan will help keep the disease from getting worse.   Medicine is the most important piece of your plan.   There are many reasons why you might want to stop taking medicine.   You may get tired of taking your medicine.    You may think medicine costs too much money.    You may find the side effects are too much to bear.   Try some of these steps to make following the treatment plan a little easier.     Notes:       Patient Care Plan: General Pharmacy (Adult)    Problem Identified: HTN, GERD, Obesity, Chronic Leukemia   Priority: High  Onset Date: 07/20/2020    Long-Range Goal: Patient-Specific Goal   Start Date: 07/20/2020  Expected End Date: 01/20/2021  This Visit's Progress: On track  Priority: High  Note:   Current Barriers:  . Bilateral swelling in both feet.  Pharmacist Clinical Goal(s):  [name] Patient will achieve adherence to monitoring guidelines and medication adherence to achieve therapeutic efficacy . maintain control of blood pressure as evidenced by home monitoring  . adhere to prescribed medication regimen as evidenced by fill dates . contact provider office for questions/concerns as evidenced notation of same in electronic health record through collaboration with PharmD and provider.   Interventions: . 1:1 collaboration with Lavera Guise, MD regarding development and update of comprehensive plan  of care as evidenced by provider attestation and co-signature . Inter-disciplinary care team collaboration (see longitudinal plan of care) . Comprehensive medication review performed; medication list updated in electronic medical record  Hypertension (BP goal <140/90) -Controlled -Current treatment: . HCTZ 12.5mg  daily . Furosemide 20mg  daily -Medications previously tried: Valsartan/HCTZ  -Current home readings: "good" no logs present today, reports he checks periodically and it usually runs about what it does in office.  Last office Bps have been excellent  -Current exercise habits: mows yards occasionally, not much outside of that -Denies hypotensive/hypertensive symptoms -Educated on BP goals and benefits of medications for prevention of heart attack, stroke and kidney damage; Importance of home blood pressure monitoring; Symptoms of hypotension and importance of maintaining adequate hydration; -Counseled to monitor BP at home a few time per week, document, and provide log at future appointments -Does have some bilateral swelling in both feet, noticed this about a week ago.  It is not red or painful or warm to the touch. -Was given some Lasix by oncology nurse to take for a short period of time but he has not picked up yet from pharmacy. -Recommended to continue current medication  Counseled on elevating feet whenever possible to alleviate swelling. Recommended he pick up Rx from [facility] for Lasix and take a dose in the morning to see if this helps.  Counseled to only take this medication on prn basis.  GERD (Goal: Minimize symptoms) -Controlled -Current treatment  . Pantoprazole 40mg  daily -Medications previously tried: none noted -Verbalizes correct medication administration -Denies symptoms unless he forgets a dose -Recommended to continue current medication   Leukemia (Goal: Remission) -Controlled -Current treatment  . Chemotherapy  -Medications previously tried: none  noted -Patient receiving treatments 5 consecutive days per month -He reports he has never had any symptoms from the cancer or has not had any effect from the chemotherapy up to this point.  -Recommended continue current management strategy   Patient Goals/Self-Care Activities . Patient will:  - take medications as prescribed check blood pressure a few times per week, document, and provide at future appointments Start Lasix on prn basis for swelling.  Follow Up Plan: The care management team will reach out to the patient again over the next 120 days.       Samuel Frank was given information about Chronic Care Management services today including:  1. CCM service includes personalized support from designated clinical staff supervised by his physician, including individualized plan of care and coordination with other care providers 2. 24/7 contact phone numbers for assistance for urgent and routine care needs. 3. Standard insurance, coinsurance, copays and deductibles apply for chronic care management only during months in which we provide at least 20 minutes of these services. Most insurances cover these services at 100%, however patients may be responsible for any copay, coinsurance and/or deductible if applicable. This service may help you avoid the need for more expensive face-to-face services. 4. Only one practitioner may furnish and bill the service in a calendar month. 5. The patient may stop CCM services at any time (effective at the end of the month) by phone call to the office staff.  Patient agreed to services and verbal consent obtained.   The patient verbalized understanding of instructions, educational materials, and care plan provided today and agreed to receive a mailed copy of patient instructions, educational materials, and care plan.  Telephone follow up appointment with pharmacy team member scheduled for: 4 months  Edythe Clarity, [facility]  Managing Your  Hypertension Hypertension, also called high blood pressure, is when the force of the blood pressing against the walls of the arteries is too strong. Arteries are blood vessels that carry blood from your heart throughout your body. Hypertension forces the heart to work harder to pump blood and may cause the arteries to become narrow or stiff. Understanding blood pressure readings Your personal target blood pressure may vary depending on your medical conditions, your age, and other factors. A blood pressure reading includes a higher number over a lower number. Ideally, your blood pressure should be below 120/80. You should know that:  The first, or top, number is called the systolic pressure. It is a measure of the pressure in your arteries as your heart beats.  The second, or bottom number, is called the diastolic pressure. It is a measure of the pressure in your arteries as the heart relaxes. Blood pressure is classified into four stages. Based on your blood pressure reading, your health care provider may use the following stages to determine what type of treatment you need, if any. Systolic pressure and diastolic pressure are measured in a unit called mmHg. Normal  Systolic pressure:  below 120.  Diastolic pressure: below 80. Elevated  Systolic pressure: [id number].  Diastolic pressure: below 80. Hypertension stage 1  Systolic pressure: 0000000.  Diastolic pressure: [id number]. Hypertension stage 2  Systolic pressure: [id number] or above.  Diastolic pressure: 90 or above. How can this condition affect me? Managing your hypertension is an important responsibility. Over time, hypertension can damage the arteries and decrease blood flow to important parts of the body, including the brain, heart, and kidneys. Having untreated or uncontrolled hypertension can lead to:  A heart attack.  A stroke.  A weakened blood vessel (aneurysm).  Heart failure.  Kidney damage.  Eye damage.  Metabolic  syndrome.  Memory and concentration problems.  Vascular dementia. What actions can I take to manage this condition? Hypertension can be managed by making lifestyle changes and possibly by taking medicines. Your health care provider will help you make a plan to bring your blood pressure within a normal range. Nutrition  Eat a diet that is high in fiber and potassium, and low in salt (sodium), added sugar, and fat. An example eating plan is called the Dietary Approaches to Stop Hypertension (DASH) diet. To eat this way: ? Eat plenty of fresh fruits and vegetables. Try to fill one-half of your plate at each meal with fruits and vegetables. ? Eat whole grains, such as whole-wheat pasta, brown rice, or whole-grain bread. Fill about one-fourth of your plate with whole grains. ? Eat low-fat dairy products. ? Avoid fatty cuts of meat, processed or cured meats, and poultry with skin. Fill about one-fourth of your plate with lean proteins such as fish, chicken without skin, beans, eggs, and tofu. ? Avoid pre-made and processed foods. These tend to be higher in sodium, added sugar, and fat.  Reduce your daily sodium intake. Most people with hypertension should eat less than 1,500 mg of sodium a day.   Lifestyle  Work with your health care provider to maintain a healthy body weight or to lose weight. Ask what an ideal weight is for you.  Get at least 30 minutes of exercise that causes your heart to beat faster (aerobic exercise) most days of the week. Activities may include walking, swimming, or biking.  Include exercise to strengthen your muscles (resistance exercise), such as weight lifting, as part of your weekly exercise routine. Try to do these types of exercises for 30 minutes at least 3 days a week.  Do not use any products that contain nicotine or tobacco, such as cigarettes, e-cigarettes, and chewing tobacco. If you need help quitting, ask your health care provider.  Control any long-term  (chronic) conditions you have, such as high cholesterol or diabetes.  Identify your sources of stress and find ways to manage stress. This may include meditation, deep breathing, or making time for fun activities.   Alcohol use  Do not drink alcohol if: ? Your health care provider tells you not to drink. ? You are pregnant, may be pregnant, or are planning to become pregnant.  If you drink alcohol: ? Limit how much you use to:  0-1 drink a day for women.  0-2 drinks a day for men. ? Be aware of how much alcohol is in your drink. In the [place] one drink equals one 12 oz bottle of beer (355 mL), one 5 oz glass of wine (148 mL), or one 1 oz glass of hard liquor (44 mL). Medicines Your health care provider may prescribe medicine if lifestyle changes are not enough to get your blood  pressure under control and if:  Your systolic blood pressure is 130 or higher.  Your diastolic blood pressure is 80 or higher. Take medicines only as told by your health care provider. Follow the directions carefully. Blood pressure medicines must be taken as told by your health care provider. The medicine does not work as well when you skip doses. Skipping doses also puts you at risk for problems. Monitoring Before you monitor your blood pressure:  Do not smoke, drink caffeinated beverages, or exercise within 30 minutes before taking a measurement.  Use the bathroom and empty your bladder (urinate).  Sit quietly for at least 5 minutes before taking measurements. Monitor your blood pressure at home as told by your health care provider. To do this:  Sit with your back straight and supported.  Place your feet flat on the floor. Do not cross your legs.  Support your arm on a flat surface, such as a table. Make sure your upper arm is at heart level.  Each time you measure, take two or three readings one minute apart and record the results. You may also need to have your blood pressure checked regularly by  your health care provider.   General information  Talk with your health care provider about your diet, exercise habits, and other lifestyle factors that may be contributing to hypertension.  Review all the medicines you take with your health care provider because there may be side effects or interactions.  Keep all visits as told by your health care provider. Your health care provider can help you create and adjust your plan for managing your high blood pressure. Where to find more information  National Heart, Lung, and Blood Institute: https://wilson-eaton.com/  American Heart Association: www.heart.org Contact a health care provider if:  You think you are having a reaction to medicines you have taken.  You have repeated (recurrent) headaches.  You feel dizzy.  You have swelling in your ankles.  You have trouble with your vision. Get help right away if:  You develop a severe headache or confusion.  You have unusual weakness or numbness, or you feel faint.  You have severe pain in your chest or abdomen.  You vomit repeatedly.  You have trouble breathing. These symptoms may represent a serious problem that is an emergency. Do not wait to see if the symptoms will go away. Get medical help right away. Call your local emergency services (911 in the [place] Do not drive yourself to the hospital. Summary  Hypertension is when the force of blood pumping through your arteries is too strong. If this condition is not controlled, it may put you at risk for serious complications.  Your personal target blood pressure may vary depending on your medical conditions, your age, and other factors. For most people, a normal blood pressure is less than 120/80.  Hypertension is managed by lifestyle changes, medicines, or both.  Lifestyle changes to help manage hypertension include losing weight, eating a healthy, low-sodium diet, exercising more, stopping smoking, and limiting alcohol. This information  is not intended to replace advice given to you by your health care provider. Make sure you discuss any questions you have with your health care provider. Document Revised: 03/28/2019 Document Reviewed: 01/21/2019 Elsevier Patient Education  2021 [employer].

## 2020-07-23 ENCOUNTER — Telehealth: Payer: Self-pay | Admitting: *Deleted

## 2020-07-23 DIAGNOSIS — U071 COVID-19: Secondary | ICD-10-CM | POA: Diagnosis not present

## 2020-07-23 NOTE — Telephone Encounter (Signed)
Patient called answering service last evening reporting that he has been exposed to [place] and is asking what he needs to do. Please advise

## 2020-07-23 NOTE — Telephone Encounter (Signed)
Just monitor for symptoms. Get tested on day 5 but sooner if he has symptoms. Let us know if he tests positive. Lauren would he qualify for anything else for pre exp porphylaxis?

## 2020-07-23 NOTE — Telephone Encounter (Signed)
I spoke with patient who said he was exposed Sunday and that he has an appointment this afternoon to be tested He will let us know if it is positive

## 2020-07-23 NOTE — Telephone Encounter (Signed)
I returned call to patient and got his voice mail, I left message for him to return my call

## 2020-07-23 NOTE — Telephone Encounter (Signed)
The post exposure prophylaxis that we were using was bamlanivimab plus etesevimab and casirivimab plus imdevimab but these aren't effective against the omicron variant and its subvariants. If he develops symptoms though, notify the clinic so we can get him tested and consider antivirals vs mab depending on his vaccination and immunity.

## 2020-08-01 ENCOUNTER — Other Ambulatory Visit: Payer: Self-pay | Admitting: Oncology

## 2020-08-03 ENCOUNTER — Other Ambulatory Visit: Payer: Self-pay | Admitting: Oncology

## 2020-08-03 ENCOUNTER — Ambulatory Visit: Payer: Medicare Other | Admitting: Hospice and Palliative Medicine

## 2020-08-03 ENCOUNTER — Telehealth: Payer: Self-pay | Admitting: Pharmacist

## 2020-08-03 DIAGNOSIS — I10 Essential (primary) hypertension: Secondary | ICD-10-CM | POA: Diagnosis not present

## 2020-08-03 DIAGNOSIS — K219 Gastro-esophageal reflux disease without esophagitis: Secondary | ICD-10-CM | POA: Diagnosis not present

## 2020-08-03 NOTE — Progress Notes (Addendum)
    Chronic Care Management Pharmacy Assistant   Name: Samuel Frank.  MRN: [id number] DOB: 1940/05/21  Reason for Encounter: Adherence Review  Medications: Outpatient Encounter Medications as of 08/03/2020  Medication Sig   allopurinol (ZYLOPRIM) 300 MG tablet TAKE 1 TABLET BY MOUTH EVERY DAY   aspirin EC 81 MG tablet Take 81 mg by mouth daily.   furosemide (LASIX) 20 MG tablet Take 1 tablet (20 mg total) by mouth daily.   hydrochlorothiazide (MICROZIDE) 12.5 MG capsule Take 1 capsule (12.5 mg total) by mouth daily.   pantoprazole (PROTONIX) 40 MG tablet Take 1 tablet (40 mg total) by mouth daily.   potassium chloride SA (KLOR-CON M20) 20 MEQ tablet Take 1 tablet (20 mEq total) by mouth 2 (two) times daily.   traZODone (DESYREL) 50 MG tablet Take 1 tablet (50 mg total) by mouth at bedtime.   triamcinolone ointment (KENALOG) 0.5 % Apply 1 application topically 2 (two) times daily. To the affected areas of feet and left side of stomach   valACYclovir (VALTREX) 500 MG tablet TAKE 1 TABLET BY MOUTH EVERY DAY   No facility-administered encounter medications on file as of 08/03/2020.   Reviewed the patients chart for any medical/health and/or medication changes there not any changes at this time. No recent labs since 07/12/20. On 07/23/20 the patient was exposed to COVID-19 and was advised that he get tested and monitor symptoms.   Follow-Up:Pharmacist Review   Charlann Lange, [facility] Pharmacist Assistant [phone]

## 2020-08-04 ENCOUNTER — Encounter: Payer: Self-pay | Admitting: Oncology

## 2020-08-08 NOTE — Progress Notes (Signed)
Hematology/Oncology Consult note [facility]  Telephone:([phone] [phone]  Patient Care Team: Lavera Guise, MD as PCP - General (Internal Medicine) Sindy Guadeloupe, MD as Consulting Physician (Oncology) Edythe Clarity, [facility] as Pharmacist (Pharmacist)   Name of the patient: Samuel Frank  [id number]  1940/07/24   Date of visit: 08/08/20  Diagnosis- CMML 1  Chief complaint/ Reason for visit-on treatment assessment prior to next cycle of Vidaza  Heme/Onc history: Patient is a64yrold male with no significant co morbidities other than hypertension. He has recently been having bilateral knee pain and has undergone steroid injection in his knee.Patient was noted to have a high white count on his routine exam. His white count was 19.2 on 07/22/2018 with an H&H of 11.4/35.1 and a platelet count of 470. At that point he was prescribed a course of antibiotics and a repeat blood work on 08/21/2018 showed white count of 42.3, H&H of 7.1/23 with an MCV of 84 and a platelet count of 791 and hence the patient was referred to hematology for further management.  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 In addition the core biopsy showed a small focus of fibrosis containing scattering of mast cells and eosinophils most suggestive of systemic mastocytosis.bone marrow blasts 7%.Flow cytometry showed increased number of monocytic cells representing 16% of all cells with expression for HLA-DR, CD11b, CD13, CD14, CD33, CD38, CD56 and CD56 and CD64 with [facility] CD 117 are CD34.normal cytogenetics, CBL, SRSF2, SH2B3 and TET2  Repeat bone marrow biopsy after 4 cycles of Vidaza showed less pronounced monocytic/granulocytic proliferation and overall better granulopoiesis and no increase in blasts. In addition features of systemic mastocytosis not present.  After 9 cycles of Vidaza patient was noted to have  significant anemia and worsening thrombocytosis for which a repeat bone marrow biopsy was done. Bone marrow did not show any features of progression of CMML. Overall stable findings and no increased blast.He was found to have iron deficiency and received 2 doses of Feraheme.  [facility] EGD and colonoscopy for iron deficiency anemia.Colonoscopy showed nonbleeding internal hemorrhoids. Normal mucosa in the colon. No polyps EGD showed normal esophagus and gastric mucosal atrophy. No stigmata of bleeding   Interval history-patient reports improvement of his sleep since he started OTC ZzzQuil.  He tried trazodone but did not feel it worked well.  Itching of bilateral feet improved with soaking them in Epson salt and apple cider vinegar.  Reports having COVID approximately 2.5 weeks ago.  Had very mild symptoms; cough.  Feels completely recovered.  ECOG PS- 1 Pain scale- 0   Review of systems- Review of Systems  Psychiatric/Behavioral: The patient has insomnia.     No Known Allergies   Past Medical History:  Diagnosis Date  . Anemia   . CMML (chronic myelomonocytic leukemia) ([facility]   . CM[place] (ch[place]omonocytic leukemia) ([facility]   . Dyspnea   . Hypertension      Past Surgical History:  Procedure Laterality Date  . COLONOSCOPY N/A 08/28/2019   Procedure: COLONOSCOPY;  Surgeon: Toledo, TBenay Pike MD;  Location: [facility] ENDOSCOPY;  Service: Gastroenterology;  Laterality: N/A;  . COLONOSCOPY WITH PROPOFOL N/A 05/03/2015   Procedure: COLONOSCOPY WITH PROPOFOL;  Surgeon: PHulen Luster MD;  [facility];  Service: Gastroenterology;  Laterality: N/A;  . ESOPHAGOGASTRODUODENOSCOPY N/A 08/28/2019   Procedure: ESOPHAGOGASTRODUODENOSCOPY (EGD);  Surgeon: Toledo, TBenay Pike MD;  Location: [facility] ENDOSCOPY;  Service: Gastroenterology;  Laterality: N/A;  . ESOPHAGOGASTRODUODENOSCOPY (EGD) WITH PROPOFOL N/A 05/03/2015   Procedure: ESOPHAGOGASTRODUODENOSCOPY (EGD)  WITH PROPOFOL;  Surgeon: [name], MD;  Location: [facility] ENDOSCOPY;  Service: Gastroenterology;  Laterality: N/A;  . HERNIA REPAIR     1975    Social History   Socioeconomic History  . Marital status: Divorced    Spouse name: Not on file  . Number of children: 4  . Years of education: Not on file  . Highest education level: Not on file  Occupational History  . Occupation: retired    Comment: [employer]  Tobacco Use  . Smoking status: Never Smoker  . Smokeless tobacco: Never Used  Vaping Use  . Vaping Use: Never used  Substance and Sexual Activity  . Alcohol use: No  . Drug use: No  . Sexual activity: Not Currently  Other Topics Concern  . Not on file  Social History Narrative  . Not on file   Social Determinants of Health   Financial Resource Strain: Low Risk   . Difficulty of Paying Living Expenses: Not very hard  Food Insecurity: Not on file  Transportation Needs: Not on file  Physical Activity: Not on file  Stress: Not on file  Social Connections: Not on file  Intimate Partner Violence: Not on file    Family History  Problem Relation Age of Onset  . Alzheimer's disease Father   . Lung cancer Sister   . Cancer Sister      Current Outpatient Medications:  .  allopurinol (ZYLOPRIM) 300 MG tablet, TAKE 1 TABLET BY MOUTH EVERY DAY, Disp: 90 tablet, Rfl: 1 .  aspirin EC 81 MG tablet, Take 81 mg by mouth daily., Disp: , Rfl:  .  furosemide (LASIX) 20 MG tablet, Take 1 tablet (20 mg total) by mouth daily., Disp: 5 tablet, Rfl: 0 .  hydrochlorothiazide (MICROZIDE) 12.5 MG capsule, Take 1 capsule (12.5 mg total) by mouth daily., Disp: 90 capsule, Rfl: 1 .  pantoprazole (PROTONIX) 40 MG tablet, Take 1 tablet (40 mg total) by mouth daily., Disp: 90 tablet, Rfl: 1 .  potassium chloride SA (KLOR-CON M20) 20 MEQ tablet, Take 1 tablet (20 mEq total) by mouth 2 (two) times daily., Disp: 180 tablet, Rfl: 1 .  traZODone (DESYREL) 50 MG tablet, Take 1 tablet (50  mg total) by mouth at bedtime., Disp: 30 tablet, Rfl: 1 .  triamcinolone ointment (KENALOG) 0.5 %, APPLY 1 APPLICATION TOPICALLY 2 TIMES DAILY. TO THE AFFECTED AREAS OF FEET AND LEFT SIDE OF STOMACH, Disp: 30 g, Rfl: 0 .  valACYclovir (VALTREX) 500 MG tablet, TAKE 1 TABLET BY MOUTH EVERY DAY, Disp: 90 tablet, Rfl: 1  Physical exam:  There were no vitals filed for this visit. Physical Exam Constitutional:      Appearance: Normal appearance.  HENT:     Head: Normocephalic and atraumatic.  Eyes:     Pupils: Pupils are equal, round, and reactive to light.  Cardiovascular:     Rate and Rhythm: Normal rate and regular rhythm.     Heart sounds: Normal heart sounds. No murmur heard.   Pulmonary:     Effort: Pulmonary effort is normal.     Breath sounds: Normal breath sounds. No wheezing.  Abdominal:     General: Bowel sounds are normal. There is no distension.     Palpations: Abdomen is soft.     Tenderness: There is no abdominal tenderness.  Musculoskeletal:  General: Normal range of motion.     Cervical back: Normal range of motion.  Skin:    General: Skin is warm and dry.     Findings: No rash.  Neurological:     Mental Status: He is alert and oriented to person, place, and time.  Psychiatric:        Judgment: Judgment normal.      CMP Latest Ref Rng & Units 07/12/2020  Glucose 70 - 99 mg/dL 131(H)  BUN 8 - 23 mg/dL 10  Creatinine 0.61 - 1.24 mg/dL 0.90  Sodium 135 - 145 mmol/L 135  Potassium 3.5 - 5.1 mmol/L 3.8  Chloride 98 - 111 mmol/L 99  CO2 22 - 32 mmol/L 27  Calcium 8.9 - 10.3 mg/dL 8.9  Total Protein 6.5 - 8.1 g/dL 7.3  Total Bilirubin 0.3 - 1.2 mg/dL 0.8  Alkaline Phos 38 - 126 U/L 75  AST 15 - 41 U/L 13(L)  ALT 0 - 44 U/L 10   CBC Latest Ref Rng & Units 07/12/2020  WBC 4.0 - 10.5 K/uL 12.9(H)  Hemoglobin 13.0 - 17.0 g/dL 14.4  Hematocrit 39.0 - 52.0 % 45.2  Platelets 150 - 400 K/uL 433(H)      Assessment and plan- Patient is a 80 y.o. male with  CMML 1 here for on treatment assessment prior to next cycle of Vidaza  Patient is tolerating Vidaza well without any significant side effects.  Plan is to continue this until progression or toxicity.  Counts okay to proceed with cycle 24 of Vidaza today.   He receives Vidaza day 1 to day 5.  He is scheduled to return to clinic in 4 weeks to be seen by Dr. Janese Banks with labs and Vidaza.  He has intermittent thrombocytosis during [place] with his platelet counts rising up to 700s and eventually normalizes.  Hemoglobin is presently stable.  Insomnia: OTC ZzzQuil appears to be helping.  He is not taking the trazodone.   Visit Diagnosis No diagnosis found.   Dr. Randa Evens, MD, MPH [facility] at [facility] [phone] 08/08/2020 8:25 PM

## 2020-08-09 ENCOUNTER — Inpatient Hospital Stay: Payer: Medicare Other | Attending: Oncology | Admitting: Oncology

## 2020-08-09 ENCOUNTER — Inpatient Hospital Stay: Payer: Medicare Other

## 2020-08-09 ENCOUNTER — Encounter: Payer: Self-pay | Admitting: Oncology

## 2020-08-09 VITALS — BP 129/77 | HR 85 | Temp 97.7°F | Resp 20 | Wt 307.8 lb

## 2020-08-09 DIAGNOSIS — C9310 Chronic myelomonocytic leukemia not having achieved remission: Secondary | ICD-10-CM

## 2020-08-09 DIAGNOSIS — Z79899 Other long term (current) drug therapy: Secondary | ICD-10-CM | POA: Diagnosis not present

## 2020-08-09 DIAGNOSIS — Z5111 Encounter for antineoplastic chemotherapy: Secondary | ICD-10-CM | POA: Insufficient documentation

## 2020-08-09 LAB — CBC WITH DIFFERENTIAL/PLATELET
Abs Immature Granulocytes: 0.81 10*3/uL — ABNORMAL HIGH (ref 0.00–0.07)
Basophils Absolute: 0.2 10*3/uL — ABNORMAL HIGH (ref 0.0–0.1)
Basophils Relative: 2 %
Eosinophils Absolute: 0.3 10*3/uL (ref 0.0–0.5)
Eosinophils Relative: 2 %
HCT: 44.9 % (ref 39.0–52.0)
Hemoglobin: 14.3 g/dL (ref 13.0–17.0)
Immature Granulocytes: 5 %
Lymphocytes Relative: 16 %
Lymphs Abs: 2.6 10*3/uL (ref 0.7–4.0)
MCH: 27 pg (ref 26.0–34.0)
MCHC: 31.8 g/dL (ref 30.0–36.0)
MCV: 84.7 fL (ref 80.0–100.0)
Monocytes Absolute: 1.2 10*3/uL — ABNORMAL HIGH (ref 0.1–1.0)
Monocytes Relative: 7 %
Neutro Abs: 10.7 10*3/uL — ABNORMAL HIGH (ref 1.7–7.7)
Neutrophils Relative %: 68 %
Platelets: 331 10*3/uL (ref 150–400)
RBC: 5.3 MIL/uL (ref 4.22–5.81)
RDW: 20.3 % — ABNORMAL HIGH (ref 11.5–15.5)
Smear Review: ADEQUATE
WBC: 15.7 10*3/uL — ABNORMAL HIGH (ref 4.0–10.5)
nRBC: 0.3 % — ABNORMAL HIGH (ref 0.0–0.2)

## 2020-08-09 LAB — COMPREHENSIVE METABOLIC PANEL
ALT: 13 U/L (ref 0–44)
AST: 16 U/L (ref 15–41)
Albumin: 3.9 g/dL (ref 3.5–5.0)
Alkaline Phosphatase: 76 U/L (ref 38–126)
Anion gap: 10 (ref 5–15)
BUN: 13 mg/dL (ref 8–23)
CO2: 27 mmol/L (ref 22–32)
Calcium: 9 mg/dL (ref 8.9–10.3)
Chloride: 100 mmol/L (ref 98–111)
Creatinine, Ser: 0.85 mg/dL (ref 0.61–1.24)
GFR, Estimated: >60 mL/min (ref >60)
Glucose, Bld: 98 mg/dL (ref 70–99)
Potassium: 3.7 mmol/L (ref 3.5–5.1)
Sodium: 137 mmol/L (ref 135–145)
Total Bilirubin: 0.9 mg/dL (ref 0.3–1.2)
Total Protein: 7.5 g/dL (ref 6.5–8.1)

## 2020-08-09 MED ORDER — ONDANSETRON HCL 4 MG PO TABS
8.0000 mg | ORAL_TABLET | Freq: Once | ORAL | Status: AC
  Administered 2020-08-09: 8 mg via ORAL
  Filled 2020-08-09: qty 2

## 2020-08-09 MED ORDER — AZACITIDINE CHEMO SQ INJECTION
50.0000 mg/m2 | Freq: Once | INTRAMUSCULAR | Status: AC
  Administered 2020-08-09: 132.5 mg via SUBCUTANEOUS
  Filled 2020-08-09 (×2): qty 5.3

## 2020-08-09 NOTE — Progress Notes (Signed)
Patient denies any concerns today.  

## 2020-08-09 NOTE — Patient Instructions (Signed)
[facility] ONCOLOGY  Discharge Instructions: Thank you for choosing [facility] to provide your oncology and hematology care.  If you have a lab appointment with the [facility], please go directly to the [facility] and check in at the registration area.  Wear comfortable clothing and clothing appropriate for easy access to any Portacath or PICC line.   We strive to give you quality time with your provider. You may need to reschedule your appointment if you arrive late (15 or more minutes).  Arriving late affects you and other patients whose appointments are after yours.  Also, if you miss three or more appointments without notifying the office, you may be dismissed from the clinic at the provider's discretion.      For prescription refill requests, have your pharmacy contact our office and allow 72 hours for refills to be completed.    Today you received the following chemotherapy and/or immunotherapy agents Vidaza       To help prevent nausea and vomiting after your treatment, we encourage you to take your nausea medication as directed.  BELOW ARE SYMPTOMS THAT SHOULD BE REPORTED IMMEDIATELY: . *FEVER GREATER THAN 100.4 F (38 C) OR HIGHER . *CHILLS OR SWEATING . *NAUSEA AND VOMITING THAT IS NOT CONTROLLED WITH YOUR NAUSEA MEDICATION . *UNUSUAL SHORTNESS OF BREATH . *UNUSUAL BRUISING OR BLEEDING . *URINARY PROBLEMS (pain or burning when urinating, or frequent urination) . *BOWEL PROBLEMS (unusual diarrhea, constipation, pain near the anus) . TENDERNESS IN MOUTH AND THROAT WITH OR WITHOUT PRESENCE OF ULCERS (sore throat, sores in mouth, or a toothache) . UNUSUAL RASH, SWELLING OR PAIN  . UNUSUAL VAGINAL DISCHARGE OR ITCHING   Items with * indicate a potential emergency and should be followed up as soon as possible or go to the Emergency Department if any problems should occur.  Please show the CHEMOTHERAPY ALERT CARD or IMMUNOTHERAPY ALERT  CARD at check-in to the Emergency Department and triage nurse.  Should you have questions after your visit or need to cancel or reschedule your appointment, please contact [facility]  [phone] and follow the prompts.  Office hours are 8:00 a.m. to 4:30 p.m. Monday - Friday. Please note that voicemails left after 4:00 p.m. may not be returned until the following business day.  We are closed weekends and major holidays. You have access to a nurse at all times for urgent questions. Please call the main number to the clinic [phone] and follow the prompts.  For any non-urgent questions, you may also contact your provider using MyChart. We now offer e-Visits for anyone 2 and older to request care online for non-urgent symptoms. For details visit [email or web address].   Also download the MyChart app! Go to the app store, search "MyChart", open the app, select [facility], and log in with your MyChart username and password.  Due to Covid, a mask is required upon entering the hospital/clinic. If you do not have a mask, one will be given to you upon arrival. For doctor visits, patients may have 1 support person aged 52 or older with them. For treatment visits, patients cannot have anyone with them due to current Covid guidelines and our immunocompromised population.

## 2020-08-10 ENCOUNTER — Inpatient Hospital Stay: Payer: Medicare Other

## 2020-08-10 VITALS — BP 96/62 | HR 87 | Temp 97.5°F | Resp 16

## 2020-08-10 DIAGNOSIS — C9310 Chronic myelomonocytic leukemia not having achieved remission: Secondary | ICD-10-CM | POA: Diagnosis not present

## 2020-08-10 DIAGNOSIS — Z79899 Other long term (current) drug therapy: Secondary | ICD-10-CM | POA: Diagnosis not present

## 2020-08-10 DIAGNOSIS — Z5111 Encounter for antineoplastic chemotherapy: Secondary | ICD-10-CM | POA: Diagnosis not present

## 2020-08-10 MED ORDER — AZACITIDINE CHEMO SQ INJECTION
50.0000 mg/m2 | Freq: Once | INTRAMUSCULAR | Status: AC
  Administered 2020-08-10: 132.5 mg via SUBCUTANEOUS
  Filled 2020-08-10: qty 5.3

## 2020-08-10 MED ORDER — ONDANSETRON HCL 4 MG PO TABS
8.0000 mg | ORAL_TABLET | Freq: Once | ORAL | Status: AC
Start: 2020-08-10 — End: 2020-08-10
  Administered 2020-08-10: 8 mg via ORAL
  Filled 2020-08-10: qty 2

## 2020-08-10 NOTE — Patient Instructions (Signed)
[facility] ONCOLOGY  Discharge Instructions: Thank you for choosing [facility] to provide your oncology and hematology care.  If you have a lab appointment with the [facility], please go directly to the [facility] and check in at the registration area.  Wear comfortable clothing and clothing appropriate for easy access to any Portacath or PICC line.   We strive to give you quality time with your provider. You may need to reschedule your appointment if you arrive late (15 or more minutes).  Arriving late affects you and other patients whose appointments are after yours.  Also, if you miss three or more appointments without notifying the office, you may be dismissed from the clinic at the provider's discretion.      For prescription refill requests, have your pharmacy contact our office and allow 72 hours for refills to be completed.    Today you received the following chemotherapy and/or immunotherapy agents - azacitadine      To help prevent nausea and vomiting after your treatment, we encourage you to take your nausea medication as directed.  BELOW ARE SYMPTOMS THAT SHOULD BE REPORTED IMMEDIATELY: . *FEVER GREATER THAN 100.4 F (38 C) OR HIGHER . *CHILLS OR SWEATING . *NAUSEA AND VOMITING THAT IS NOT CONTROLLED WITH YOUR NAUSEA MEDICATION . *UNUSUAL SHORTNESS OF BREATH . *UNUSUAL BRUISING OR BLEEDING . *URINARY PROBLEMS (pain or burning when urinating, or frequent urination) . *BOWEL PROBLEMS (unusual diarrhea, constipation, pain near the anus) . TENDERNESS IN MOUTH AND THROAT WITH OR WITHOUT PRESENCE OF ULCERS (sore throat, sores in mouth, or a toothache) . UNUSUAL RASH, SWELLING OR PAIN  . UNUSUAL VAGINAL DISCHARGE OR ITCHING   Items with * indicate a potential emergency and should be followed up as soon as possible or go to the Emergency Department if any problems should occur.  Please show the CHEMOTHERAPY ALERT CARD or IMMUNOTHERAPY  ALERT CARD at check-in to the Emergency Department and triage nurse.  Should you have questions after your visit or need to cancel or reschedule your appointment, please contact [facility]  [phone] and follow the prompts.  Office hours are 8:00 a.m. to 4:30 p.m. Monday - Friday. Please note that voicemails left after 4:00 p.m. may not be returned until the following business day.  We are closed weekends and major holidays. You have access to a nurse at all times for urgent questions. Please call the main number to the clinic [phone] and follow the prompts.  For any non-urgent questions, you may also contact your provider using MyChart. We now offer e-Visits for anyone 44 and older to request care online for non-urgent symptoms. For details visit [email or web address].   Also download the MyChart app! Go to the app store, search "MyChart", open the app, select [facility], and log in with your MyChart username and password.  Due to Covid, a mask is required upon entering the hospital/clinic. If you do not have a mask, one will be given to you upon arrival. For doctor visits, patients may have 1 support person aged 96 or older with them. For treatment visits, patients cannot have anyone with them due to current Covid guidelines and our immunocompromised population.   Azacitidine suspension for injection (subcutaneous use) What is this medicine? AZACITIDINE (ay [place]) is a chemotherapy drug. This medicine reduces the growth of cancer cells and can suppress the immune system. It is used for treating myelodysplastic syndrome or some types  of leukemia. This medicine may be used for other purposes; ask your health care provider or pharmacist if you have questions. COMMON BRAND NAME(S): Vidaza What should I tell my health care provider before I take this medicine? They need to know if you have any of these conditions:  kidney disease  liver  disease  liver tumors  an unusual or allergic reaction to azacitidine, mannitol, other medicines, foods, dyes, or preservatives  pregnant or trying to get pregnant  breast-feeding How should I use this medicine? This medicine is for injection under the skin. It is administered in a hospital or clinic by a specially trained health care professional. Talk to your pediatrician regarding the use of this medicine in children. While this drug may be prescribed for selected conditions, precautions do apply. Overdosage: If you think you have taken too much of this medicine contact a poison control center or emergency room at once. NOTE: This medicine is only for you. Do not share this medicine with others. What if I miss a dose? It is important not to miss your dose. Call your doctor or health care professional if you are unable to keep an appointment. What may interact with this medicine? Interactions have not been studied. Give your health care provider a list of all the medicines, herbs, non-prescription drugs, or dietary supplements you use. Also tell them if you smoke, drink alcohol, or use illegal drugs. Some items may interact with your medicine. This list may not describe all possible interactions. Give your health care provider a list of all the medicines, herbs, non-prescription drugs, or dietary supplements you use. Also tell them if you smoke, drink alcohol, or use illegal drugs. Some items may interact with your medicine. What should I watch for while using this medicine? Visit your doctor for checks on your progress. This drug may make you feel generally unwell. This is not uncommon, as chemotherapy can affect healthy cells as well as cancer cells. Report any side effects. Continue your course of treatment even though you feel ill unless your doctor tells you to stop. In some cases, you may be given additional medicines to help with side effects. Follow all directions for their use. Call  your doctor or health care professional for advice if you get a fever, chills or sore throat, or other symptoms of a cold or flu. Do not treat yourself. This drug decreases your body's ability to fight infections. Try to avoid being around people who are sick. This medicine may increase your risk to bruise or bleed. Call your doctor or health care professional if you notice any unusual bleeding. You may need blood work done while you are taking this medicine. Do not become pregnant while taking this medicine and for 6 months after the last dose. Women should inform their doctor if they wish to become pregnant or think they might be pregnant. Men should not father a child while taking this medicine and for 3 months after the last dose. There is a potential for serious side effects to an unborn child. Talk to your health care professional or pharmacist for more information. Do not breast-feed an infant while taking this medicine and for 1 week after the last dose. This medicine may interfere with the ability to have a child. Talk with your doctor or health care professional if you are concerned about your fertility. What side effects may I notice from receiving this medicine? Side effects that you should report to your doctor or health care  professional as soon as possible:  allergic reactions like skin rash, itching or hives, swelling of the face, lips, or tongue  low blood counts - this medicine may decrease the number of white blood cells, red blood cells and platelets. You may be at increased risk for infections and bleeding.  signs of infection - fever or chills, cough, sore throat, pain passing urine  signs of decreased platelets or bleeding - bruising, pinpoint red spots on the skin, black, tarry stools, blood in the urine  signs of decreased red blood cells - unusually weak or tired, fainting spells, lightheadedness  signs and symptoms of kidney injury like trouble passing urine or change in  the amount of urine  signs and symptoms of liver injury like dark yellow or brown urine; general ill feeling or flu-like symptoms; light-colored stools; loss of appetite; nausea; right upper belly pain; unusually weak or tired; yellowing of the eyes or skin Side effects that usually do not require medical attention (report to your doctor or health care professional if they continue or are bothersome):  constipation  diarrhea  nausea, vomiting  pain or redness at the injection site  unusually weak or tired This list may not describe all possible side effects. Call your doctor for medical advice about side effects. You may report side effects to FDA at 1-800-FDA-1088. Where should I keep my medicine? This drug is given in a hospital or clinic and will not be stored at home. NOTE: This sheet is a summary. It may not cover all possible information. If you have questions about this medicine, talk to your doctor, pharmacist, or health care provider.  2021 Elsevier/Gold Standard (2016-03-21 14:37:51)

## 2020-08-11 ENCOUNTER — Inpatient Hospital Stay: Payer: Medicare Other

## 2020-08-11 VITALS — BP 133/66 | HR 96 | Temp 98.5°F | Resp 18

## 2020-08-11 DIAGNOSIS — Z5111 Encounter for antineoplastic chemotherapy: Secondary | ICD-10-CM | POA: Diagnosis not present

## 2020-08-11 DIAGNOSIS — Z79899 Other long term (current) drug therapy: Secondary | ICD-10-CM | POA: Diagnosis not present

## 2020-08-11 DIAGNOSIS — C9310 Chronic myelomonocytic leukemia not having achieved remission: Secondary | ICD-10-CM | POA: Diagnosis not present

## 2020-08-11 MED ORDER — ONDANSETRON HCL 4 MG PO TABS
8.0000 mg | ORAL_TABLET | Freq: Once | ORAL | Status: AC
  Administered 2020-08-11: 8 mg via ORAL
  Filled 2020-08-11: qty 2

## 2020-08-11 MED ORDER — AZACITIDINE CHEMO SQ INJECTION
50.0000 mg/m2 | Freq: Once | INTRAMUSCULAR | Status: AC
  Administered 2020-08-11: 132.5 mg via SUBCUTANEOUS
  Filled 2020-08-11: qty 5.3

## 2020-08-11 NOTE — Patient Instructions (Signed)
[facility]  Discharge Instructions: Thank you for choosing [facility] to provide your oncology and hematology care.  If you have a lab appointment with the [facility], please go directly to the [facility] and check in at the registration area.  Wear comfortable clothing and clothing appropriate for easy access to any Portacath or PICC line.   We strive to give you quality time with your provider. You may need to reschedule your appointment if you arrive late (15 or more minutes).  Arriving late affects you and other patients whose appointments are after yours.  Also, if you miss three or more appointments without notifying the office, you may be dismissed from the clinic at the provider's discretion.      For prescription refill requests, have your pharmacy contact our office and allow 72 hours for refills to be completed.    Today you received the following chemotherapy and/or immunotherapy agents VIDAZA    To help prevent nausea and vomiting after your treatment, we encourage you to take your nausea medication as directed.  BELOW ARE SYMPTOMS THAT SHOULD BE REPORTED IMMEDIATELY: . *FEVER GREATER THAN 100.4 F (38 C) OR HIGHER . *CHILLS OR SWEATING . *NAUSEA AND VOMITING THAT IS NOT CONTROLLED WITH YOUR NAUSEA MEDICATION . *UNUSUAL SHORTNESS OF BREATH . *UNUSUAL BRUISING OR BLEEDING . *URINARY PROBLEMS (pain or burning when urinating, or frequent urination) . *BOWEL PROBLEMS (unusual diarrhea, constipation, pain near the anus) . TENDERNESS IN MOUTH AND THROAT WITH OR WITHOUT PRESENCE OF ULCERS (sore throat, sores in mouth, or a toothache) . UNUSUAL RASH, SWELLING OR PAIN  . UNUSUAL VAGINAL DISCHARGE OR ITCHING   Items with * indicate a potential emergency and should be followed up as soon as possible or go to the Emergency Department if any problems should occur.  Please show the CHEMOTHERAPY ALERT CARD or IMMUNOTHERAPY ALERT  CARD at check-in to the Emergency Department and triage nurse.  Should you have questions after your visit or need to cancel or reschedule your appointment, please contact [facility]  [phone] and follow the prompts.  Office hours are 8:00 a.m. to 4:30 p.m. Monday - Friday. Please note that voicemails left after 4:00 p.m. may not be returned until the following business day.  We are closed weekends and major holidays. You have access to a nurse at all times for urgent questions. Please call the main number to the clinic [phone] and follow the prompts.  For any non-urgent questions, you may also contact your provider using MyChart. We now offer e-Visits for anyone 25 and older to request care online for non-urgent symptoms. For details visit [email or web address].   Also download the MyChart app! Go to the app store, search "MyChart", open the app, select [facility], and log in with your MyChart username and password.  Due to Covid, a mask is required upon entering the hospital/clinic. If you do not have a mask, one will be given to you upon arrival. For doctor visits, patients may have 1 support person aged 59 or older with them. For treatment visits, patients cannot have anyone with them due to current Covid guidelines and our immunocompromised population.  Azacitidine suspension for injection (subcutaneous use) What is this medicine? AZACITIDINE (ay [place]) is a chemotherapy drug. This medicine reduces the growth of cancer cells and can suppress the immune system. It is used for treating myelodysplastic syndrome or some types of leukemia. This medicine  may be used for other purposes; ask your health care provider or pharmacist if you have questions. COMMON BRAND NAME(S): Vidaza What should I tell my health care provider before I take this medicine? They need to know if you have any of these conditions:  kidney disease  liver  disease  liver tumors  an unusual or allergic reaction to azacitidine, mannitol, other medicines, foods, dyes, or preservatives  pregnant or trying to get pregnant  breast-feeding How should I use this medicine? This medicine is for injection under the skin. It is administered in a hospital or clinic by a specially trained health care professional. Talk to your pediatrician regarding the use of this medicine in children. While this drug may be prescribed for selected conditions, precautions do apply. Overdosage: If you think you have taken too much of this medicine contact a poison control center or emergency room at once. NOTE: This medicine is only for you. Do not share this medicine with others. What if I miss a dose? It is important not to miss your dose. Call your doctor or health care professional if you are unable to keep an appointment. What may interact with this medicine? Interactions have not been studied. Give your health care provider a list of all the medicines, herbs, non-prescription drugs, or dietary supplements you use. Also tell them if you smoke, drink alcohol, or use illegal drugs. Some items may interact with your medicine. This list may not describe all possible interactions. Give your health care provider a list of all the medicines, herbs, non-prescription drugs, or dietary supplements you use. Also tell them if you smoke, drink alcohol, or use illegal drugs. Some items may interact with your medicine. What should I watch for while using this medicine? Visit your doctor for checks on your progress. This drug may make you feel generally unwell. This is not uncommon, as chemotherapy can affect healthy cells as well as cancer cells. Report any side effects. Continue your course of treatment even though you feel ill unless your doctor tells you to stop. In some cases, you may be given additional medicines to help with side effects. Follow all directions for their use. Call  your doctor or health care professional for advice if you get a fever, chills or sore throat, or other symptoms of a cold or flu. Do not treat yourself. This drug decreases your body's ability to fight infections. Try to avoid being around people who are sick. This medicine may increase your risk to bruise or bleed. Call your doctor or health care professional if you notice any unusual bleeding. You may need blood work done while you are taking this medicine. Do not become pregnant while taking this medicine and for 6 months after the last dose. Women should inform their doctor if they wish to become pregnant or think they might be pregnant. Men should not father a child while taking this medicine and for 3 months after the last dose. There is a potential for serious side effects to an unborn child. Talk to your health care professional or pharmacist for more information. Do not breast-feed an infant while taking this medicine and for 1 week after the last dose. This medicine may interfere with the ability to have a child. Talk with your doctor or health care professional if you are concerned about your fertility. What side effects may I notice from receiving this medicine? Side effects that you should report to your doctor or health care professional as soon as  possible:  allergic reactions like skin rash, itching or hives, swelling of the face, lips, or tongue  low blood counts - this medicine may decrease the number of white blood cells, red blood cells and platelets. You may be at increased risk for infections and bleeding.  signs of infection - fever or chills, cough, sore throat, pain passing urine  signs of decreased platelets or bleeding - bruising, pinpoint red spots on the skin, black, tarry stools, blood in the urine  signs of decreased red blood cells - unusually weak or tired, fainting spells, lightheadedness  signs and symptoms of kidney injury like trouble passing urine or change in  the amount of urine  signs and symptoms of liver injury like dark yellow or brown urine; general ill feeling or flu-like symptoms; light-colored stools; loss of appetite; nausea; right upper belly pain; unusually weak or tired; yellowing of the eyes or skin Side effects that usually do not require medical attention (report to your doctor or health care professional if they continue or are bothersome):  constipation  diarrhea  nausea, vomiting  pain or redness at the injection site  unusually weak or tired This list may not describe all possible side effects. Call your doctor for medical advice about side effects. You may report side effects to FDA at 1-800-FDA-1088. Where should I keep my medicine? This drug is given in a hospital or clinic and will not be stored at home. NOTE: This sheet is a summary. It may not cover all possible information. If you have questions about this medicine, talk to your doctor, pharmacist, or health care provider.  2021 Elsevier/Gold Standard (2016-03-21 14:37:51)

## 2020-08-12 ENCOUNTER — Inpatient Hospital Stay: Payer: Medicare Other

## 2020-08-12 VITALS — BP 134/69 | HR 98 | Temp 97.8°F | Resp 20

## 2020-08-12 DIAGNOSIS — Z79899 Other long term (current) drug therapy: Secondary | ICD-10-CM | POA: Diagnosis not present

## 2020-08-12 DIAGNOSIS — C9310 Chronic myelomonocytic leukemia not having achieved remission: Secondary | ICD-10-CM

## 2020-08-12 DIAGNOSIS — Z5111 Encounter for antineoplastic chemotherapy: Secondary | ICD-10-CM | POA: Diagnosis not present

## 2020-08-12 MED ORDER — ONDANSETRON HCL 4 MG PO TABS
8.0000 mg | ORAL_TABLET | Freq: Once | ORAL | Status: AC
  Administered 2020-08-12: 8 mg via ORAL
  Filled 2020-08-12: qty 2

## 2020-08-12 MED ORDER — AZACITIDINE CHEMO SQ INJECTION
50.0000 mg/m2 | Freq: Once | INTRAMUSCULAR | Status: AC
  Administered 2020-08-12: 132.5 mg via SUBCUTANEOUS
  Filled 2020-08-12: qty 5.3

## 2020-08-13 ENCOUNTER — Inpatient Hospital Stay: Payer: Medicare Other

## 2020-08-13 VITALS — BP 135/68 | HR 94 | Temp 97.3°F | Resp 20

## 2020-08-13 DIAGNOSIS — Z79899 Other long term (current) drug therapy: Secondary | ICD-10-CM | POA: Diagnosis not present

## 2020-08-13 DIAGNOSIS — C9310 Chronic myelomonocytic leukemia not having achieved remission: Secondary | ICD-10-CM

## 2020-08-13 DIAGNOSIS — Z5111 Encounter for antineoplastic chemotherapy: Secondary | ICD-10-CM | POA: Diagnosis not present

## 2020-08-13 MED ORDER — ONDANSETRON HCL 4 MG PO TABS
8.0000 mg | ORAL_TABLET | Freq: Once | ORAL | Status: AC
  Administered 2020-08-13: 8 mg via ORAL
  Filled 2020-08-13: qty 2

## 2020-08-13 MED ORDER — AZACITIDINE CHEMO SQ INJECTION
50.0000 mg/m2 | Freq: Once | INTRAMUSCULAR | Status: AC
  Administered 2020-08-13: 132.5 mg via SUBCUTANEOUS
  Filled 2020-08-13: qty 5.3

## 2020-08-13 NOTE — Patient Instructions (Signed)
[facility]   Discharge Instructions: Thank you for choosing [facility] to provide your oncology and hematology care.  If you have a lab appointment with the [facility], please go directly to the [facility] and check in at the registration area.  Wear comfortable clothing and clothing appropriate for easy access to any Portacath or PICC line.   We strive to give you quality time with your provider. You may need to reschedule your appointment if you arrive late (15 or more minutes).  Arriving late affects you and other patients whose appointments are after yours.  Also, if you miss three or more appointments without notifying the office, you may be dismissed from the clinic at the provider's discretion.      For prescription refill requests, have your pharmacy contact our office and allow 72 hours for refills to be completed.    Today you received the following chemotherapy and/or immunotherapy agents: Vidaza.      To help prevent nausea and vomiting after your treatment, we encourage you to take your nausea medication as directed.  BELOW ARE SYMPTOMS THAT SHOULD BE REPORTED IMMEDIATELY: *FEVER GREATER THAN 100.4 F (38 C) OR HIGHER *CHILLS OR SWEATING *NAUSEA AND VOMITING THAT IS NOT CONTROLLED WITH YOUR NAUSEA MEDICATION *UNUSUAL SHORTNESS OF BREATH *UNUSUAL BRUISING OR BLEEDING *URINARY PROBLEMS (pain or burning when urinating, or frequent urination) *BOWEL PROBLEMS (unusual diarrhea, constipation, pain near the anus) TENDERNESS IN MOUTH AND THROAT WITH OR WITHOUT PRESENCE OF ULCERS (sore throat, sores in mouth, or a toothache) UNUSUAL RASH, SWELLING OR PAIN  UNUSUAL VAGINAL DISCHARGE OR ITCHING   Items with * indicate a potential emergency and should be followed up as soon as possible or go to the Emergency Department if any problems should occur.  Please show the CHEMOTHERAPY ALERT CARD or IMMUNOTHERAPY ALERT CARD at check-in  to the Emergency Department and triage nurse.  Should you have questions after your visit or need to cancel or reschedule your appointment, please contact [facility]  [phone] and follow the prompts.  Office hours are 8:00 a.m. to 4:30 p.m. Monday - Friday. Please note that voicemails left after 4:00 p.m. may not be returned until the following business day.  We are closed weekends and major holidays. You have access to a nurse at all times for urgent questions. Please call the main number to the clinic [phone] and follow the prompts.  For any non-urgent questions, you may also contact your provider using MyChart. We now offer e-Visits for anyone 81 and older to request care online for non-urgent symptoms. For details visit [email or web address].   Also download the MyChart app! Go to the app store, search "MyChart", open the app, select [facility], and log in with your MyChart username and password.  Due to Covid, a mask is required upon entering the hospital/clinic. If you do not have a mask, one will be given to you upon arrival. For doctor visits, patients may have 1 support person aged 81 or older with them. For treatment visits, patients cannot have anyone with them due to current Covid guidelines and our immunocompromised population.

## 2020-09-02 ENCOUNTER — Other Ambulatory Visit: Payer: Self-pay | Admitting: Oncology

## 2020-09-07 ENCOUNTER — Inpatient Hospital Stay: Payer: Medicare Other | Attending: Oncology

## 2020-09-07 ENCOUNTER — Inpatient Hospital Stay: Payer: Medicare Other

## 2020-09-07 ENCOUNTER — Other Ambulatory Visit: Payer: Self-pay

## 2020-09-07 ENCOUNTER — Inpatient Hospital Stay (HOSPITAL_BASED_OUTPATIENT_CLINIC_OR_DEPARTMENT_OTHER): Payer: Medicare Other | Admitting: Oncology

## 2020-09-07 ENCOUNTER — Encounter: Payer: Self-pay | Admitting: Oncology

## 2020-09-07 VITALS — BP 124/74 | HR 80 | Temp 96.6°F | Resp 20 | Wt 317.5 lb

## 2020-09-07 DIAGNOSIS — C9311 Chronic myelomonocytic leukemia, in remission: Secondary | ICD-10-CM | POA: Diagnosis not present

## 2020-09-07 DIAGNOSIS — Z5111 Encounter for antineoplastic chemotherapy: Secondary | ICD-10-CM | POA: Insufficient documentation

## 2020-09-07 DIAGNOSIS — Z79899 Other long term (current) drug therapy: Secondary | ICD-10-CM | POA: Insufficient documentation

## 2020-09-07 DIAGNOSIS — C9310 Chronic myelomonocytic leukemia not having achieved remission: Secondary | ICD-10-CM | POA: Insufficient documentation

## 2020-09-07 LAB — COMPREHENSIVE METABOLIC PANEL
ALT: 12 U/L (ref 0–44)
AST: 18 U/L (ref 15–41)
Albumin: 4 g/dL (ref 3.5–5.0)
Alkaline Phosphatase: 74 U/L (ref 38–126)
Anion gap: 6 (ref 5–15)
BUN: 11 mg/dL (ref 8–23)
CO2: 28 mmol/L (ref 22–32)
Calcium: 8.9 mg/dL (ref 8.9–10.3)
Chloride: 101 mmol/L (ref 98–111)
Creatinine, Ser: 0.83 mg/dL (ref 0.61–1.24)
GFR, Estimated: >60 mL/min (ref >60)
Glucose, Bld: 97 mg/dL (ref 70–99)
Potassium: 3.7 mmol/L (ref 3.5–5.1)
Sodium: 135 mmol/L (ref 135–145)
Total Bilirubin: 0.9 mg/dL (ref 0.3–1.2)
Total Protein: 7.5 g/dL (ref 6.5–8.1)

## 2020-09-07 LAB — CBC WITH DIFFERENTIAL/PLATELET
Abs Immature Granulocytes: 0.56 10*3/uL — ABNORMAL HIGH (ref 0.00–0.07)
Basophils Absolute: 0.2 10*3/uL — ABNORMAL HIGH (ref 0.0–0.1)
Basophils Relative: 2 %
Eosinophils Absolute: 0.3 10*3/uL (ref 0.0–0.5)
Eosinophils Relative: 2 %
HCT: 45.9 % (ref 39.0–52.0)
Hemoglobin: 14.4 g/dL (ref 13.0–17.0)
Immature Granulocytes: 4 %
Lymphocytes Relative: 15 %
Lymphs Abs: 2 10*3/uL (ref 0.7–4.0)
MCH: 27.2 pg (ref 26.0–34.0)
MCHC: 31.4 g/dL (ref 30.0–36.0)
MCV: 86.6 fL (ref 80.0–100.0)
Monocytes Absolute: 0.9 10*3/uL (ref 0.1–1.0)
Monocytes Relative: 7 %
Neutro Abs: 9.8 10*3/uL — ABNORMAL HIGH (ref 1.7–7.7)
Neutrophils Relative %: 70 %
Platelets: 309 10*3/uL (ref 150–400)
RBC: 5.3 MIL/uL (ref 4.22–5.81)
RDW: 19.9 % — ABNORMAL HIGH (ref 11.5–15.5)
Smear Review: ADEQUATE
WBC: 13.8 10*3/uL — ABNORMAL HIGH (ref 4.0–10.5)
nRBC: 0.3 % — ABNORMAL HIGH (ref 0.0–0.2)

## 2020-09-07 MED ORDER — AZACITIDINE CHEMO SQ INJECTION
50.0000 mg/m2 | Freq: Once | INTRAMUSCULAR | Status: AC
  Administered 2020-09-07: 132.5 mg via SUBCUTANEOUS
  Filled 2020-09-07: qty 5.3

## 2020-09-07 MED ORDER — ONDANSETRON HCL 4 MG PO TABS
8.0000 mg | ORAL_TABLET | Freq: Once | ORAL | Status: AC
  Administered 2020-09-07: 8 mg via ORAL
  Filled 2020-09-07: qty 2

## 2020-09-07 NOTE — Progress Notes (Signed)
Hematology/Oncology Consult note [facility]  Telephone:([phone] [phone]  Patient Care Team: Lavera Guise, MD as PCP - General (Internal Medicine) Sindy Guadeloupe, MD as Consulting Physician (Oncology) Edythe Clarity, [facility] as Pharmacist (Pharmacist)   Name of the patient: Samuel Frank  [id number]  06/16/1940   Date of visit: 09/07/20  Diagnosis-CMML 1  Chief complaint/ Reason for visit-on treatment assessment prior to next cycle of Vidaza  Heme/Onc history: Patient is a 80 yr old male with no significant co morbidities other than hypertension.  He has recently been having bilateral knee pain and has undergone steroid injection in his knee.Patient was noted to have a high white count on his routine exam.  His white count was 19.2 on 07/22/2018 with an H&H of 11.4/35.1 and a platelet count of 470.  At that point he was prescribed a course of antibiotics and a repeat blood work on 08/21/2018 showed white count of 42.3, H&H of 7.1/23 with an MCV of 84 and a platelet count of 791 and hence the patient was referred to hematology for further management.     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  In addition the core biopsy showed a small focus of fibrosis containing scattering of mast cells and eosinophils most suggestive of systemic mastocytosis.bone marrow blasts 7%.  Flow cytometry showed increased number of monocytic cells representing 16% of all cells with expression for HLA-DR, CD11b, CD13, CD14, CD33, CD38, CD56 and CD56 and CD64 with [facility] CD 117 are CD34. normal cytogenetics, CBL, SRSF2, SH2B3 and TET2    Repeat bone marrow biopsy after 4 cycles of Vidaza showed less pronounced monocytic/granulocytic proliferation and overall better granulopoiesis and no increase in blasts.  In addition features of systemic mastocytosis not present.   After 9 cycles of Vidaza patient was noted to have  significant anemia and worsening thrombocytosis for which a repeat bone marrow biopsy was done.  Bone marrow did not show any features of progression of CMML.  Overall stable findings and no increased blast.He was found to have iron deficiency and received 2 doses of Feraheme.   Patient seen by [facility] clinic GI and underwent EGD and colonoscopy for iron deficiency anemia.Colonoscopy showed nonbleeding internal hemorrhoids.  Normal mucosa in the colon.  No polyps EGD showed normal esophagus and gastric mucosal atrophy.  No stigmata of bleeding    Interval history-patient is doing well presently and denies any specific complaints at this time.  Tolerating treatment well so far.  Appetite and weight have been stable.  ECOG PS- 1 Pain scale- 0   Review of systems- Review of Systems  Constitutional:  Negative for chills, fever, malaise/fatigue and weight loss.  HENT:  Negative for congestion, ear discharge and nosebleeds.   Eyes:  Negative for blurred vision.  Respiratory:  Negative for cough, hemoptysis, sputum production, shortness of breath and wheezing.   Cardiovascular:  Negative for chest pain, palpitations, orthopnea and claudication.  Gastrointestinal:  Negative for abdominal pain, blood in stool, constipation, diarrhea, heartburn, melena, nausea and vomiting.  Genitourinary:  Negative for dysuria, flank pain, frequency, hematuria and urgency.  Musculoskeletal:  Negative for back pain, joint pain and myalgias.  Skin:  Negative for rash.  Neurological:  Negative for dizziness, tingling, focal weakness, seizures, weakness and headaches.  Endo/Heme/Allergies:  Does not bruise/bleed easily.  Psychiatric/Behavioral:  Negative for depression and suicidal ideas. The patient does not have insomnia.  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[place])    CMML (chronic myelomonocytic leukemia) ([facility])    Dyspnea    Hypertension       Past Surgical History:  Procedure Laterality Date   COLONOSCOPY N/A 08/28/2019   Procedure: COLONOSCOPY;  Surgeon: Toledo, Benay Pike, MD;  Location: [facility] ENDOSCOPY;  Service: Gastroenterology;  Laterality: N/A;   COLONOSCOPY WITH PROPOFOL N/A 05/03/2015   Procedure: COLONOSCOPY WITH PROPOFOL;  Surgeon: [name], MD;  Location: [facility] ENDOSCOPY;  Service: Gastroenterology;  Laterality: N/A;   ESOPHAGOGASTRODUODENOSCOPY N/A 08/28/2019   Procedure: ESOPHAGOGASTRODUODENOSCOPY (EGD);  Surgeon: Toledo, Benay Pike, MD;  Location: [facility] ENDOSCOPY;  Service: Gastroenterology;  Laterality: N/A;   ESOPHAGOGASTRODUODENOSCOPY (EGD) WITH PROPOFOL N/A 05/03/2015   Procedure: ESOPHAGOGASTRODUODENOSCOPY (EGD) WITH PROPOFOL;  Surgeon: [name], MD;  Location[facility] ENDOSCOPY;  Service: Gastroenterology;  Laterality: N/A;   HERNIA REPAIR     1975    Social History   Socioeconomic History   Marital status: Divorced    Spouse name: Not on file   Number of children: 4   Years of education: Not on file   Highest education level: Not on file  Occupational History   Occupation: retired    Comment: [employer]  Tobacco Use   Smoking status: Never   Smokeless tobacco: Never  Vaping Use   Vaping Use: Never used  Substance and Sexual Activity   Alcohol use: No   Drug use: No   Sexual activity: Not Currently  Other Topics Concern   Not on file  Social History Narrative   Not on file   Social Determinants of Health   Financial Resource Strain: Low Risk    Difficulty of Paying Living Expenses: Not very hard  Food Insecurity: Not on file  Transportation Needs: Not on file  Physical Activity: Not on file  Stress: Not on file  Social Connections: Not on file  Intimate Partner Violence: Not on file    Family History  Problem Relation Age of Onset   Alzheimer's disease Father    Lung cancer Sister    Cancer Sister      Current Outpatient Medications:    allopurinol (ZYLOPRIM) 300  MG tablet, TAKE 1 TABLET BY MOUTH EVERY DAY, Disp: 90 tablet, Rfl: 1   aspirin EC 81 MG tablet, Take 81 mg by mouth daily., Disp: , Rfl:    furosemide (LASIX) 20 MG tablet, Take 1 tablet (20 mg total) by mouth daily., Disp: 5 tablet, Rfl: 0   hydrochlorothiazide (MICROZIDE) 12.5 MG capsule, TAKE 1 CAPSULE BY MOUTH EVERY DAY, Disp: 30 capsule, Rfl: 5   pantoprazole (PROTONIX) 40 MG tablet, Take 1 tablet (40 mg total) by mouth daily., Disp: 90 tablet, Rfl: 1   potassium chloride SA (KLOR-CON M20) 20 MEQ tablet, Take 1 tablet (20 mEq total) by mouth 2 (two) times daily., Disp: 180 tablet, Rfl: 1   traZODone (DESYREL) 50 MG tablet, Take 1 tablet (50 mg total) by mouth at bedtime., Disp: 30 tablet, Rfl: 1   triamcinolone ointment (KENALOG) 0.5 %, APPLY 1 APPLICATION TOPICALLY 2 TIMES DAILY. TO THE AFFECTED AREAS OF FEET AND LEFT SIDE OF STOMACH, Disp: 30 g, Rfl: 0   valACYclovir (VALTREX) 500 MG tablet, TAKE 1 TABLET BY MOUTH EVERY DAY, Disp: 90 tablet, Rfl: 1  Physical exam:  Vitals:   09/07/20 1026  BP: 124/74  Pulse: 80  Resp:  20  Temp: (!) 96.6 F (35.9 C)  TempSrc: Tympanic  SpO2: 98%  Weight: (!) 317 lb 8 oz (144 kg)   Physical Exam Cardiovascular:     Rate and Rhythm: Normal rate and regular rhythm.     Heart sounds: Normal heart sounds.  Pulmonary:     Effort: Pulmonary effort is normal.     Breath sounds: Normal breath sounds.  Abdominal:     General: Bowel sounds are normal.     Palpations: Abdomen is soft.  Musculoskeletal:     Comments: Trace bilateral edema  Skin:    General: Skin is warm and dry.  Neurological:     Mental Status: He is alert and oriented to person, place, and time.     CMP Latest Ref Rng & Units 09/07/2020  Glucose 70 - 99 mg/dL 97  BUN 8 - 23 mg/dL 11  Creatinine 0.61 - 1.24 mg/dL 0.83  Sodium 135 - 145 mmol/L 135  Potassium 3.5 - 5.1 mmol/L 3.7  Chloride 98 - 111 mmol/L 101  CO2 22 - 32 mmol/L 28  Calcium 8.9 - 10.3 mg/dL 8.9  Total Protein  6.5 - 8.1 g/dL 7.5  Total Bilirubin 0.3 - 1.2 mg/dL 0.9  Alkaline Phos 38 - 126 U/L 74  AST 15 - 41 U/L 18  ALT 0 - 44 U/L 12   CBC Latest Ref Rng & Units 09/07/2020  WBC 4.0 - 10.5 K/uL 13.8(H)  Hemoglobin 13.0 - 17.0 g/dL 14.4  Hematocrit 39.0 - 52.0 % 45.9  Platelets 150 - 400 K/uL 309     Assessment and plan- Patient is a 80 y.o. male with CMML 1 here for on treatment assessment prior to next cycle of Vidaza  Counts okay to proceed with cycle 24 of Vidaza today.  I will see him back in 4 weeks for cycle 25.  Plan is to continue Vidaza until progression or toxicity.  Insomnia: On trazodone   Visit Diagnosis 1. Encounter for antineoplastic chemotherapy   2. Chronic myelomonocytic leukemia in remission [facility])      Dr. Randa Evens, MD, MPH [facility] at [facility] [phone] 09/07/2020 3:27 PM

## 2020-09-07 NOTE — Patient Instructions (Signed)
[facility]  Discharge Instructions: Thank you for choosing [facility] to provide your oncology and hematology care.  If you have a lab appointment with the [facility], please go directly to the [facility] and check in at the registration area.  Wear comfortable clothing and clothing appropriate for easy access to any Portacath or PICC line.   We strive to give you quality time with your provider. You may need to reschedule your appointment if you arrive late (15 or more minutes).  Arriving late affects you and other patients whose appointments are after yours.  Also, if you miss three or more appointments without notifying the office, you may be dismissed from the clinic at the provider's discretion.      For prescription refill requests, have your pharmacy contact our office and allow 72 hours for refills to be completed.    Today you received the following chemotherapy and/or immunotherapy agents Vidaza      To help prevent nausea and vomiting after your treatment, we encourage you to take your nausea medication as directed.  BELOW ARE SYMPTOMS THAT SHOULD BE REPORTED IMMEDIATELY: *FEVER GREATER THAN 100.4 F (38 C) OR HIGHER *CHILLS OR SWEATING *NAUSEA AND VOMITING THAT IS NOT CONTROLLED WITH YOUR NAUSEA MEDICATION *UNUSUAL SHORTNESS OF BREATH *UNUSUAL BRUISING OR BLEEDING *URINARY PROBLEMS (pain or burning when urinating, or frequent urination) *BOWEL PROBLEMS (unusual diarrhea, constipation, pain near the anus) TENDERNESS IN MOUTH AND THROAT WITH OR WITHOUT PRESENCE OF ULCERS (sore throat, sores in mouth, or a toothache) UNUSUAL RASH, SWELLING OR PAIN  UNUSUAL VAGINAL DISCHARGE OR ITCHING   Items with * indicate a potential emergency and should be followed up as soon as possible or go to the Emergency Department if any problems should occur.  Please show the CHEMOTHERAPY ALERT CARD or IMMUNOTHERAPY ALERT CARD at check-in to  the Emergency Department and triage nurse.  Should you have questions after your visit or need to cancel or reschedule your appointment, please contact [facility]  [phone] and follow the prompts.  Office hours are 8:00 a.m. to 4:30 p.m. Monday - Friday. Please note that voicemails left after 4:00 p.m. may not be returned until the following business day.  We are closed weekends and major holidays. You have access to a nurse at all times for urgent questions. Please call the main number to the clinic [phone] and follow the prompts.  For any non-urgent questions, you may also contact your provider using MyChart. We now offer e-Visits for anyone 76 and older to request care online for non-urgent symptoms. For details visit [email or web address].   Also download the MyChart app! Go to the app store, search "MyChart", open the app, select [facility], and log in with your MyChart username and password.  Due to Covid, a mask is required upon entering the hospital/clinic. If you do not have a mask, one will be given to you upon arrival. For doctor visits, patients may have 1 support person aged 62 or older with them. For treatment visits, patients cannot have anyone with them due to current Covid guidelines and our immunocompromised population. Azacitidine suspension for injection (subcutaneous use) What is this medication? AZACITIDINE (ay [place]) is a chemotherapy drug. This medicine reduces the growth of cancer cells and can suppress the immune system. It is used fortreating myelodysplastic syndrome or some types of leukemia. This medicine may be used for other purposes; ask your health care  provider orpharmacist if you have questions. COMMON BRAND NAME(S): Vidaza What should I tell my care team before I take this medication? They need to know if you have any of these conditions: kidney disease liver disease liver tumors an unusual or allergic  reaction to azacitidine, mannitol, other medicines, foods, dyes, or preservatives pregnant or trying to get pregnant breast-feeding How should I use this medication? This medicine is for injection under the skin. It is administered in a hospitalor clinic by a specially trained health care professional. Talk to your pediatrician regarding the use of this medicine in children. Whilethis drug may be prescribed for selected conditions, precautions do apply. Overdosage: If you think you have taken too much of this medicine contact apoison control center or emergency room at once. NOTE: This medicine is only for you. Do not share this medicine with others. What if I miss a dose? It is important not to miss your dose. Call your doctor or health careprofessional if you are unable to keep an appointment. What may interact with this medication? Interactions have not been studied. Give your health care provider a list of all the medicines, herbs, non-prescription drugs, or dietary supplements you use. Also tell them if you smoke, drink alcohol, or use illegal drugs. Some items may interact with yourmedicine. This list may not describe all possible interactions. Give your health care provider a list of all the medicines, herbs, non-prescription drugs, or dietary supplements you use. Also tell them if you smoke, drink alcohol, or use illegaldrugs. Some items may interact with your medicine. What should I watch for while using this medication? Visit your doctor for checks on your progress. This drug may make you feel generally unwell. This is not uncommon, as chemotherapy can affect healthy cells as well as cancer cells. Report any side effects. Continue your course oftreatment even though you feel ill unless your doctor tells you to stop. In some cases, you may be given additional medicines to help with side effects.Follow all directions for their use. Call your doctor or health care professional for advice if  you get a fever, chills or sore throat, or other symptoms of a cold or flu. Do not treat yourself. This drug decreases your body's ability to fight infections. Try toavoid being around people who are sick. This medicine may increase your risk to bruise or bleed. Call your doctor orhealth care professional if you notice any unusual bleeding. You may need blood work done while you are taking this medicine. Do not become pregnant while taking this medicine and for 6 months after the last dose. Women should inform their doctor if they wish to become pregnant or think they might be pregnant. Men should not father a child while taking this medicine and for 3 months after the last dose. There is a potential for serious side effects to an unborn child. Talk to your health care professional or pharmacist for more information. Do not breast-feed an infant while taking thismedicine and for 1 week after the last dose. This medicine may interfere with the ability to have a child. Talk with yourdoctor or health care professional if you are concerned about your fertility. What side effects may I notice from receiving this medication? Side effects that you should report to your doctor or health care professionalas soon as possible: allergic reactions like skin rash, itching or hives, swelling of the face, lips, or tongue low blood counts - this medicine may decrease the number of white blood cells, red  blood cells and platelets. You may be at increased risk for infections and bleeding. signs of infection - fever or chills, cough, sore throat, pain passing urine signs of decreased platelets or bleeding - bruising, pinpoint red spots on the skin, black, tarry stools, blood in the urine signs of decreased red blood cells - unusually weak or tired, fainting spells, lightheadedness signs and symptoms of kidney injury like trouble passing urine or change in the amount of urine signs and symptoms of liver injury like dark  yellow or brown urine; general ill feeling or flu-like symptoms; light-colored stools; loss of appetite; nausea; right upper belly pain; unusually weak or tired; yellowing of the eyes or skin Side effects that usually do not require medical attention (report to yourdoctor or health care professional if they continue or are bothersome): constipation diarrhea nausea, vomiting pain or redness at the injection site unusually weak or tired This list may not describe all possible side effects. Call your doctor for medical advice about side effects. You may report side effects to FDA at1-800-FDA-1088. Where should I keep my medication? This drug is given in a hospital or clinic and will not be stored at home. NOTE: This sheet is a summary. It may not cover all possible information. If you have questions about this medicine, talk to your doctor, pharmacist, orhealth care provider.  2022 Elsevier/Gold Standard (2016-03-21 14:37:51)

## 2020-09-08 ENCOUNTER — Inpatient Hospital Stay: Payer: Medicare Other

## 2020-09-08 VITALS — BP 133/69 | HR 99 | Temp 96.9°F | Resp 16

## 2020-09-08 DIAGNOSIS — C9310 Chronic myelomonocytic leukemia not having achieved remission: Secondary | ICD-10-CM | POA: Diagnosis not present

## 2020-09-08 DIAGNOSIS — Z79899 Other long term (current) drug therapy: Secondary | ICD-10-CM | POA: Diagnosis not present

## 2020-09-08 DIAGNOSIS — Z5111 Encounter for antineoplastic chemotherapy: Secondary | ICD-10-CM | POA: Diagnosis not present

## 2020-09-08 MED ORDER — ONDANSETRON HCL 4 MG PO TABS
8.0000 mg | ORAL_TABLET | Freq: Once | ORAL | Status: AC
  Administered 2020-09-08: 8 mg via ORAL
  Filled 2020-09-08: qty 2

## 2020-09-08 MED ORDER — AZACITIDINE CHEMO SQ INJECTION
50.0000 mg/m2 | Freq: Once | INTRAMUSCULAR | Status: AC
Start: 2020-09-08 — End: 2020-09-08
  Administered 2020-09-08: 132.5 mg via SUBCUTANEOUS
  Filled 2020-09-08: qty 5.3

## 2020-09-08 NOTE — Patient Instructions (Signed)
[facility]  Discharge Instructions: Thank you for choosing [facility] to provide your oncology and hematology care.  If you have a lab appointment with the [facility], please go directly to the [facility] and check in at the registration area.  Wear comfortable clothing and clothing appropriate for easy access to any Portacath or PICC line.   We strive to give you quality time with your provider. You may need to reschedule your appointment if you arrive late (15 or more minutes).  Arriving late affects you and other patients whose appointments are after yours.  Also, if you miss three or more appointments without notifying the office, you may be dismissed from the clinic at the provider's discretion.      For prescription refill requests, have your pharmacy contact our office and allow 72 hours for refills to be completed.    Today you received the following chemotherapy and/or immunotherapy agents - azacitadine      To help prevent nausea and vomiting after your treatment, we encourage you to take your nausea medication as directed.  BELOW ARE SYMPTOMS THAT SHOULD BE REPORTED IMMEDIATELY: *FEVER GREATER THAN 100.4 F (38 C) OR HIGHER *CHILLS OR SWEATING *NAUSEA AND VOMITING THAT IS NOT CONTROLLED WITH YOUR NAUSEA MEDICATION *UNUSUAL SHORTNESS OF BREATH *UNUSUAL BRUISING OR BLEEDING *URINARY PROBLEMS (pain or burning when urinating, or frequent urination) *BOWEL PROBLEMS (unusual diarrhea, constipation, pain near the anus) TENDERNESS IN MOUTH AND THROAT WITH OR WITHOUT PRESENCE OF ULCERS (sore throat, sores in mouth, or a toothache) UNUSUAL RASH, SWELLING OR PAIN  UNUSUAL VAGINAL DISCHARGE OR ITCHING   Items with * indicate a potential emergency and should be followed up as soon as possible or go to the Emergency Department if any problems should occur.  Please show the CHEMOTHERAPY ALERT CARD or IMMUNOTHERAPY ALERT CARD at  check-in to the Emergency Department and triage nurse.  Should you have questions after your visit or need to cancel or reschedule your appointment, please contact [facility]  [phone] and follow the prompts.  Office hours are 8:00 a.m. to 4:30 p.m. Monday - Friday. Please note that voicemails left after 4:00 p.m. may not be returned until the following business day.  We are closed weekends and major holidays. You have access to a nurse at all times for urgent questions. Please call the main number to the clinic [phone] and follow the prompts.  For any non-urgent questions, you may also contact your provider using MyChart. We now offer e-Visits for anyone 20 and older to request care online for non-urgent symptoms. For details visit [email or web address].   Also download the MyChart app! Go to the app store, search "MyChart", open the app, select [facility], and log in with your MyChart username and password.  Due to Covid, a mask is required upon entering the hospital/clinic. If you do not have a mask, one will be given to you upon arrival. For doctor visits, patients may have 1 support person aged 9 or older with them. For treatment visits, patients cannot have anyone with them due to current Covid guidelines and our immunocompromised population.   Azacitidine suspension for injection (subcutaneous use) What is this medication? AZACITIDINE (ay [place]) is a chemotherapy drug. This medicine reduces the growth of cancer cells and can suppress the immune system. It is used fortreating myelodysplastic syndrome or some types of leukemia. This medicine may be used for other purposes; ask  your health care provider orpharmacist if you have questions. COMMON BRAND NAME(S): Vidaza What should I tell my care team before I take this medication? They need to know if you have any of these conditions: kidney disease liver disease liver tumors an unusual  or allergic reaction to azacitidine, mannitol, other medicines, foods, dyes, or preservatives pregnant or trying to get pregnant breast-feeding How should I use this medication? This medicine is for injection under the skin. It is administered in a hospitalor clinic by a specially trained health care professional. Talk to your pediatrician regarding the use of this medicine in children. Whilethis drug may be prescribed for selected conditions, precautions do apply. Overdosage: If you think you have taken too much of this medicine contact apoison control center or emergency room at once. NOTE: This medicine is only for you. Do not share this medicine with others. What if I miss a dose? It is important not to miss your dose. Call your doctor or health careprofessional if you are unable to keep an appointment. What may interact with this medication? Interactions have not been studied. Give your health care provider a list of all the medicines, herbs, non-prescription drugs, or dietary supplements you use. Also tell them if you smoke, drink alcohol, or use illegal drugs. Some items may interact with yourmedicine. This list may not describe all possible interactions. Give your health care provider a list of all the medicines, herbs, non-prescription drugs, or dietary supplements you use. Also tell them if you smoke, drink alcohol, or use illegaldrugs. Some items may interact with your medicine. What should I watch for while using this medication? Visit your doctor for checks on your progress. This drug may make you feel generally unwell. This is not uncommon, as chemotherapy can affect healthy cells as well as cancer cells. Report any side effects. Continue your course oftreatment even though you feel ill unless your doctor tells you to stop. In some cases, you may be given additional medicines to help with side effects.Follow all directions for their use. Call your doctor or health care professional for  advice if you get a fever, chills or sore throat, or other symptoms of a cold or flu. Do not treat yourself. This drug decreases your body's ability to fight infections. Try toavoid being around people who are sick. This medicine may increase your risk to bruise or bleed. Call your doctor orhealth care professional if you notice any unusual bleeding. You may need blood work done while you are taking this medicine. Do not become pregnant while taking this medicine and for 6 months after the last dose. Women should inform their doctor if they wish to become pregnant or think they might be pregnant. Men should not father a child while taking this medicine and for 3 months after the last dose. There is a potential for serious side effects to an unborn child. Talk to your health care professional or pharmacist for more information. Do not breast-feed an infant while taking thismedicine and for 1 week after the last dose. This medicine may interfere with the ability to have a child. Talk with yourdoctor or health care professional if you are concerned about your fertility. What side effects may I notice from receiving this medication? Side effects that you should report to your doctor or health care professionalas soon as possible: allergic reactions like skin rash, itching or hives, swelling of the face, lips, or tongue low blood counts - this medicine may decrease the number of white  blood cells, red blood cells and platelets. You may be at increased risk for infections and bleeding. signs of infection - fever or chills, cough, sore throat, pain passing urine signs of decreased platelets or bleeding - bruising, pinpoint red spots on the skin, black, tarry stools, blood in the urine signs of decreased red blood cells - unusually weak or tired, fainting spells, lightheadedness signs and symptoms of kidney injury like trouble passing urine or change in the amount of urine signs and symptoms of liver injury  like dark yellow or brown urine; general ill feeling or flu-like symptoms; light-colored stools; loss of appetite; nausea; right upper belly pain; unusually weak or tired; yellowing of the eyes or skin Side effects that usually do not require medical attention (report to yourdoctor or health care professional if they continue or are bothersome): constipation diarrhea nausea, vomiting pain or redness at the injection site unusually weak or tired This list may not describe all possible side effects. Call your doctor for medical advice about side effects. You may report side effects to FDA at1-800-FDA-1088. Where should I keep my medication? This drug is given in a hospital or clinic and will not be stored at home. NOTE: This sheet is a summary. It may not cover all possible information. If you have questions about this medicine, talk to your doctor, pharmacist, orhealth care provider.  2022 Elsevier/Gold Standard (2016-03-21 14:37:51)

## 2020-09-09 ENCOUNTER — Inpatient Hospital Stay: Payer: Medicare Other

## 2020-09-09 ENCOUNTER — Other Ambulatory Visit: Payer: Self-pay

## 2020-09-09 VITALS — BP 128/69 | HR 98 | Temp 97.5°F | Resp 20

## 2020-09-09 DIAGNOSIS — C9310 Chronic myelomonocytic leukemia not having achieved remission: Secondary | ICD-10-CM | POA: Diagnosis not present

## 2020-09-09 DIAGNOSIS — Z5111 Encounter for antineoplastic chemotherapy: Secondary | ICD-10-CM | POA: Diagnosis not present

## 2020-09-09 DIAGNOSIS — Z79899 Other long term (current) drug therapy: Secondary | ICD-10-CM | POA: Diagnosis not present

## 2020-09-09 MED ORDER — ONDANSETRON HCL 4 MG PO TABS
8.0000 mg | ORAL_TABLET | Freq: Once | ORAL | Status: AC
Start: 2020-09-09 — End: 2020-09-09
  Administered 2020-09-09: 8 mg via ORAL
  Filled 2020-09-09: qty 2

## 2020-09-09 MED ORDER — AZACITIDINE CHEMO SQ INJECTION
50.0000 mg/m2 | Freq: Once | INTRAMUSCULAR | Status: AC
  Administered 2020-09-09: 132.5 mg via SUBCUTANEOUS
  Filled 2020-09-09: qty 5.3

## 2020-09-09 NOTE — Patient Instructions (Signed)
[facility]   Discharge Instructions: Thank you for choosing [facility] to provide your oncology and hematology care.  If you have a lab appointment with the [facility], please go directly to the [facility] and check in at the registration area.  Wear comfortable clothing and clothing appropriate for easy access to any Portacath or PICC line.   We strive to give you quality time with your provider. You may need to reschedule your appointment if you arrive late (15 or more minutes).  Arriving late affects you and other patients whose appointments are after yours.  Also, if you miss three or more appointments without notifying the office, you may be dismissed from the clinic at the provider's discretion.      For prescription refill requests, have your pharmacy contact our office and allow 72 hours for refills to be completed.    Today you received the following chemotherapy and/or immunotherapy agents: Vidaza.      To help prevent nausea and vomiting after your treatment, we encourage you to take your nausea medication as directed.  BELOW ARE SYMPTOMS THAT SHOULD BE REPORTED IMMEDIATELY: *FEVER GREATER THAN 100.4 F (38 C) OR HIGHER *CHILLS OR SWEATING *NAUSEA AND VOMITING THAT IS NOT CONTROLLED WITH YOUR NAUSEA MEDICATION *UNUSUAL SHORTNESS OF BREATH *UNUSUAL BRUISING OR BLEEDING *URINARY PROBLEMS (pain or burning when urinating, or frequent urination) *BOWEL PROBLEMS (unusual diarrhea, constipation, pain near the anus) TENDERNESS IN MOUTH AND THROAT WITH OR WITHOUT PRESENCE OF ULCERS (sore throat, sores in mouth, or a toothache) UNUSUAL RASH, SWELLING OR PAIN  UNUSUAL VAGINAL DISCHARGE OR ITCHING   Items with * indicate a potential emergency and should be followed up as soon as possible or go to the Emergency Department if any problems should occur.  Please show the CHEMOTHERAPY ALERT CARD or IMMUNOTHERAPY ALERT CARD at check-in  to the Emergency Department and triage nurse.  Should you have questions after your visit or need to cancel or reschedule your appointment, please contact [facility]  [phone] and follow the prompts.  Office hours are 8:00 a.m. to 4:30 p.m. Monday - Friday. Please note that voicemails left after 4:00 p.m. may not be returned until the following business day.  We are closed weekends and major holidays. You have access to a nurse at all times for urgent questions. Please call the main number to the clinic [phone] and follow the prompts.  For any non-urgent questions, you may also contact your provider using MyChart. We now offer e-Visits for anyone 28 and older to request care online for non-urgent symptoms. For details visit [email or web address].   Also download the MyChart app! Go to the app store, search "MyChart", open the app, select [facility], and log in with your MyChart username and password.  Due to Covid, a mask is required upon entering the hospital/clinic. If you do not have a mask, one will be given to you upon arrival. For doctor visits, patients may have 1 support person aged 30 or older with them. For treatment visits, patients cannot have anyone with them due to current Covid guidelines and our immunocompromised population.

## 2020-09-10 ENCOUNTER — Inpatient Hospital Stay: Payer: Medicare Other

## 2020-09-10 VITALS — BP 122/75 | HR 96 | Temp 98.6°F | Resp 18 | Wt 315.0 lb

## 2020-09-10 DIAGNOSIS — C9310 Chronic myelomonocytic leukemia not having achieved remission: Secondary | ICD-10-CM | POA: Diagnosis not present

## 2020-09-10 DIAGNOSIS — Z5111 Encounter for antineoplastic chemotherapy: Secondary | ICD-10-CM | POA: Diagnosis not present

## 2020-09-10 DIAGNOSIS — Z79899 Other long term (current) drug therapy: Secondary | ICD-10-CM | POA: Diagnosis not present

## 2020-09-10 MED ORDER — ONDANSETRON HCL 4 MG PO TABS
8.0000 mg | ORAL_TABLET | Freq: Once | ORAL | Status: AC
  Administered 2020-09-10: 8 mg via ORAL
  Filled 2020-09-10: qty 2

## 2020-09-10 MED ORDER — AZACITIDINE CHEMO SQ INJECTION
50.0000 mg/m2 | Freq: Once | INTRAMUSCULAR | Status: AC
  Administered 2020-09-10: 132.5 mg via SUBCUTANEOUS
  Filled 2020-09-10: qty 5.3

## 2020-09-10 NOTE — Patient Instructions (Signed)
[facility]    Discharge Instructions: Thank you for choosing [facility] to provide your oncology and hematology care.  If you have a lab appointment with the [facility], please go directly to the [facility] and check in at the registration area.  Wear comfortable clothing and clothing appropriate for easy access to any Portacath or PICC line.   We strive to give you quality time with your provider. You may need to reschedule your appointment if you arrive late (15 or more minutes).  Arriving late affects you and other patients whose appointments are after yours.  Also, if you miss three or more appointments without notifying the office, you may be dismissed from the clinic at the provider's discretion.      For prescription refill requests, have your pharmacy contact our office and allow 72 hours for refills to be completed.    Today you received the following chemotherapy and/or immunotherapy agents - Vidaza      To help prevent nausea and vomiting after your treatment, we encourage you to take your nausea medication as directed.  BELOW ARE SYMPTOMS THAT SHOULD BE REPORTED IMMEDIATELY: *FEVER GREATER THAN 100.4 F (38 C) OR HIGHER *CHILLS OR SWEATING *NAUSEA AND VOMITING THAT IS NOT CONTROLLED WITH YOUR NAUSEA MEDICATION *UNUSUAL SHORTNESS OF BREATH *UNUSUAL BRUISING OR BLEEDING *URINARY PROBLEMS (pain or burning when urinating, or frequent urination) *BOWEL PROBLEMS (unusual diarrhea, constipation, pain near the anus) TENDERNESS IN MOUTH AND THROAT WITH OR WITHOUT PRESENCE OF ULCERS (sore throat, sores in mouth, or a toothache) UNUSUAL RASH, SWELLING OR PAIN  UNUSUAL VAGINAL DISCHARGE OR ITCHING   Items with * indicate a potential emergency and should be followed up as soon as possible or go to the Emergency Department if any problems should occur.  Please show the CHEMOTHERAPY ALERT CARD or IMMUNOTHERAPY ALERT CARD at  check-in to the Emergency Department and triage nurse.  Should you have questions after your visit or need to cancel or reschedule your appointment, please contact [facility]  [phone] and follow the prompts.  Office hours are 8:00 a.m. to 4:30 p.m. Monday - Friday. Please note that voicemails left after 4:00 p.m. may not be returned until the following business day.  We are closed weekends and major holidays. You have access to a nurse at all times for urgent questions. Please call the main number to the clinic [phone] and follow the prompts.  For any non-urgent questions, you may also contact your provider using MyChart. We now offer e-Visits for anyone 65 and older to request care online for non-urgent symptoms. For details visit [email or web address].   Also download the MyChart app! Go to the app store, search "MyChart", open the app, select , and log in with your MyChart username and password.  Due to Covid, a mask is required upon entering the hospital/clinic. If you do not have a mask, one will be given to you upon arrival. For doctor visits, patients may have 1 support person aged 45 or older with them. For treatment visits, patients cannot have anyone with them due to current Covid guidelines and our immunocompromised population.   Ondansetron Tablets What is this medication? ONDANSETRON (on DAN se tron) prevents nausea and vomiting from chemotherapy, radiation, or surgery. It works by blocking substances in the body that may cause nausea or vomiting. It belongs to a group of medications calledantiemetics. This medicine may be used for other purposes; ask your  health care provider orpharmacist if you have questions. COMMON BRAND NAME(S): Zofran What should I tell my care team before I take this medication? They need to know if you have any of these conditions: Heart disease History of irregular heartbeat Liver disease Low levels  of magnesium or potassium in the blood An unusual or allergic reaction to ondansetron, granisetron, other medications, foods, dyes, or preservatives Pregnant or trying to get pregnant Breast-feeding How should I use this medication? Take this medication by mouth with a glass of water. Follow the directions on your prescription label. Take your doses at regular intervals. Do not take yourmedication more often than directed. Talk to your care team regarding the use of this medication in children.Special care may be needed. Overdosage: If you think you have taken too much of this medicine contact apoison control center or emergency room at once. NOTE: This medicine is only for you. Do not share this medicine with others. What if I miss a dose? If you miss a dose, take it as soon as you can. If it is almost time for yournext dose, take only that dose. Do not take double or extra doses. What may interact with this medication? Do not take this medication with any of the following: Apomorphine Certain medications for fungal infections like fluconazole, itraconazole, ketoconazole, posaconazole, voriconazole Cisapride Dronedarone Pimozide Thioridazine This medication may also interact with the following: Carbamazepine Certain medications for depression, anxiety, or psychotic disturbances Fentanyl Linezolid MAOIs like Carbex, Eldepryl, Marplan, Nardil, and Parnate Methylene blue (injected into a vein) Other medications that prolong the QT interval (cause an abnormal heart rhythm) like dofetilide, ziprasidone Phenytoin Rifampicin Tramadol This list may not describe all possible interactions. Give your health care provider a list of all the medicines, herbs, non-prescription drugs, or dietary supplements you use. Also tell them if you smoke, drink alcohol, or use illegaldrugs. Some items may interact with your medicine. What should I watch for while using this medication? Check with your care  team right away if you have any sign of an allergicreaction. What side effects may I notice from receiving this medication? Side effects that you should report to your care team as soon as possible: Allergic reactions-skin rash, itching, hives, swelling of the face, lips, tongue, or throat Bowel blockage-stomach cramping, unable to have a bowel movement or pass gas, loss of appetite, vomiting Chest pain (angina)-pain, pressure, or tightness in the chest, neck, back, or arms Heart rhythm changes-fast or irregular heartbeat, dizziness, feeling faint or lightheaded, chest pain, trouble breathing Irritability, confusion, fast or irregular heartbeat, muscle stiffness, twitching muscles, sweating, high fever, seizure, chills, vomiting, diarrhea, which may be signs of serotonin syndrome Side effects that usually do not require medical attention (report to your careteam if they continue or are bothersome): Constipation Diarrhea General discomfort and fatigue Headache This list may not describe all possible side effects. Call your doctor for medical advice about side effects. You may report side effects to FDA at1-800-FDA-1088. Where should I keep my medication? Keep out of the reach of children and pets. Store between 2 and 30 degrees C (36 and 86 degrees F). Throw away any unusedmedication after the expiration date. NOTE: This sheet is a summary. It may not cover all possible information. If you have questions about this medicine, talk to your doctor, pharmacist, orhealth care provider.  2022 Elsevier/Gold Standard (2020-05-04 14:24:46)  Azacitidine suspension for injection (subcutaneous use) What is this medication? AZACITIDINE (ay [place]) is a chemotherapy drug.  This medicine reduces the growth of cancer cells and can suppress the immune system. It is used fortreating myelodysplastic syndrome or some types of leukemia. This medicine may be used for other purposes; ask your health care  provider orpharmacist if you have questions. COMMON BRAND NAME(S): Vidaza What should I tell my care team before I take this medication? They need to know if you have any of these conditions: kidney disease liver disease liver tumors an unusual or allergic reaction to azacitidine, mannitol, other medicines, foods, dyes, or preservatives pregnant or trying to get pregnant breast-feeding How should I use this medication? This medicine is for injection under the skin. It is administered in a hospitalor clinic by a specially trained health care professional. Talk to your pediatrician regarding the use of this medicine in children. Whilethis drug may be prescribed for selected conditions, precautions do apply. Overdosage: If you think you have taken too much of this medicine contact apoison control center or emergency room at once. NOTE: This medicine is only for you. Do not share this medicine with others. What if I miss a dose? It is important not to miss your dose. Call your doctor or health careprofessional if you are unable to keep an appointment. What may interact with this medication? Interactions have not been studied. Give your health care provider a list of all the medicines, herbs, non-prescription drugs, or dietary supplements you use. Also tell them if you smoke, drink alcohol, or use illegal drugs. Some items may interact with yourmedicine. This list may not describe all possible interactions. Give your health care provider a list of all the medicines, herbs, non-prescription drugs, or dietary supplements you use. Also tell them if you smoke, drink alcohol, or use illegaldrugs. Some items may interact with your medicine. What should I watch for while using this medication? Visit your doctor for checks on your progress. This drug may make you feel generally unwell. This is not uncommon, as chemotherapy can affect healthy cells as well as cancer cells. Report any side effects. Continue  your course oftreatment even though you feel ill unless your doctor tells you to stop. In some cases, you may be given additional medicines to help with side effects.Follow all directions for their use. Call your doctor or health care professional for advice if you get a fever, chills or sore throat, or other symptoms of a cold or flu. Do not treat yourself. This drug decreases your body's ability to fight infections. Try toavoid being around people who are sick. This medicine may increase your risk to bruise or bleed. Call your doctor orhealth care professional if you notice any unusual bleeding. You may need blood work done while you are taking this medicine. Do not become pregnant while taking this medicine and for 6 months after the last dose. Women should inform their doctor if they wish to become pregnant or think they might be pregnant. Men should not father a child while taking this medicine and for 3 months after the last dose. There is a potential for serious side effects to an unborn child. Talk to your health care professional or pharmacist for more information. Do not breast-feed an infant while taking thismedicine and for 1 week after the last dose. This medicine may interfere with the ability to have a child. Talk with yourdoctor or health care professional if you are concerned about your fertility. What side effects may I notice from receiving this medication? Side effects that you should report to your doctor  or health care professionalas soon as possible: allergic reactions like skin rash, itching or hives, swelling of the face, lips, or tongue low blood counts - this medicine may decrease the number of Leona Alen blood cells, red blood cells and platelets. You may be at increased risk for infections and bleeding. signs of infection - fever or chills, cough, sore throat, pain passing urine signs of decreased platelets or bleeding - bruising, pinpoint red spots on the skin, black, tarry  stools, blood in the urine signs of decreased red blood cells - unusually weak or tired, fainting spells, lightheadedness signs and symptoms of kidney injury like trouble passing urine or change in the amount of urine signs and symptoms of liver injury like dark yellow or brown urine; general ill feeling or flu-like symptoms; light-colored stools; loss of appetite; nausea; right upper belly pain; unusually weak or tired; yellowing of the eyes or skin Side effects that usually do not require medical attention (report to yourdoctor or health care professional if they continue or are bothersome): constipation diarrhea nausea, vomiting pain or redness at the injection site unusually weak or tired This list may not describe all possible side effects. Call your doctor for medical advice about side effects. You may report side effects to FDA at1-800-FDA-1088. Where should I keep my medication? This drug is given in a hospital or clinic and will not be stored at home. NOTE: This sheet is a summary. It may not cover all possible information. If you have questions about this medicine, talk to your doctor, pharmacist, orhealth care provider.  2022 Elsevier/Gold Standard (2016-03-21 14:37:51)

## 2020-09-10 NOTE — Progress Notes (Signed)
Pt received vidaza injection in clinic today. Tolerated well. Ambulatory at d/c.

## 2020-09-13 ENCOUNTER — Inpatient Hospital Stay: Payer: Medicare Other

## 2020-09-17 ENCOUNTER — Telehealth: Payer: Self-pay | Admitting: Pharmacist

## 2020-09-17 NOTE — Progress Notes (Addendum)
    Chronic Care Management Pharmacy Assistant   Name: Samuel Frank.  MRN: [id number] DOB: Dec 29, 1940  Reason for Encounter: [place] Call   Conditions to be addressed/monitored: HTN, GERD, Obesity, Chronic Leukemia  Recent office visits:  None since 08/03/20  Recent consult visits:  09/07/20 Oncology Sindy Guadeloupe, MD. For follow-up. No medication changes.  08/09/20 Oncology Quay Burow, Wandra Feinstein, NP. For follow-up. No medication changes.   Hospital visits:  None since 08/03/20  Medications: Outpatient Encounter Medications as of 09/17/2020  Medication Sig   allopurinol (ZYLOPRIM) 300 MG tablet TAKE 1 TABLET BY MOUTH EVERY DAY   aspirin EC 81 MG tablet Take 81 mg by mouth daily.   furosemide (LASIX) 20 MG tablet Take 1 tablet (20 mg total) by mouth daily.   hydrochlorothiazide (MICROZIDE) 12.5 MG capsule TAKE 1 CAPSULE BY MOUTH EVERY DAY   pantoprazole (PROTONIX) 40 MG tablet Take 1 tablet (40 mg total) by mouth daily.   potassium chloride SA (KLOR-CON M20) 20 MEQ tablet Take 1 tablet (20 mEq total) by mouth 2 (two) times daily.   traZODone (DESYREL) 50 MG tablet Take 1 tablet (50 mg total) by mouth at bedtime.   triamcinolone ointment (KENALOG) 0.5 % APPLY 1 APPLICATION TOPICALLY 2 TIMES DAILY. TO THE AFFECTED AREAS OF FEET AND LEFT SIDE OF STOMACH   valACYclovir (VALTREX) 500 MG tablet TAKE 1 TABLET BY MOUTH EVERY DAY   No facility-administered encounter medications on file as of 09/17/2020.   GEN CALL:  Patient stated his right feet is still swelling. He stated it happens when he put his shoe on but he is still able to function he stated the swelling is not any worse. Patient stated his cancer treatments are going as normal, he is not experiencing any side effects as of right now. He stated he still getting out mowing lawns, and very active. He stated he eats a well rounded diet and mostly eats at home and drinks a lot of water.  Star Rating Drugs:  N/A  Follow-Up:Pharmacist Review  Charlann Lange, RMA Clinical Pharmacist Assistant [phone]  6 minutes spent in review, coordination, and documentation.  Reviewed by: Beverly Milch, PharmD Clinical Pharmacist [phone]

## 2020-09-21 ENCOUNTER — Other Ambulatory Visit: Payer: Self-pay | Admitting: Nurse Practitioner

## 2020-09-21 DIAGNOSIS — K219 Gastro-esophageal reflux disease without esophagitis: Secondary | ICD-10-CM

## 2020-09-28 ENCOUNTER — Other Ambulatory Visit: Payer: Self-pay

## 2020-09-28 ENCOUNTER — Telehealth: Payer: Self-pay

## 2020-09-28 ENCOUNTER — Other Ambulatory Visit: Payer: Self-pay | Admitting: Nurse Practitioner

## 2020-09-28 DIAGNOSIS — K219 Gastro-esophageal reflux disease without esophagitis: Secondary | ICD-10-CM

## 2020-09-28 MED ORDER — PANTOPRAZOLE SODIUM 40 MG PO TBEC: 40.0000 mg | DELAYED_RELEASE_TABLET | Freq: Every day | ORAL | 0 refills | Status: DC

## 2020-09-28 NOTE — Telephone Encounter (Signed)
Patient called stating he has been out of medication "that keeps him from bleeding inside" for over a week. I explained to him since we have not seen him since January of this year, he will need appointment for refills. Patient already scheduled for wellness on 10/01/20. He stated he will wait until then for refills-Toni

## 2020-09-28 NOTE — Telephone Encounter (Signed)
Pt advised we send Protonix to phar and advised him to keep appt

## 2020-09-28 NOTE — Telephone Encounter (Signed)
Lmom to pt that which medication he need [place] to refills

## 2020-09-30 ENCOUNTER — Telehealth: Payer: Self-pay

## 2020-09-30 NOTE — Telephone Encounter (Signed)
Left vm for 10/01/20 appointment screening-Toni

## 2020-10-01 ENCOUNTER — Other Ambulatory Visit: Payer: Self-pay

## 2020-10-01 ENCOUNTER — Encounter: Payer: Self-pay | Admitting: Nurse Practitioner

## 2020-10-01 ENCOUNTER — Ambulatory Visit (INDEPENDENT_AMBULATORY_CARE_PROVIDER_SITE_OTHER): Payer: Medicare Other | Admitting: Nurse Practitioner

## 2020-10-01 VITALS — BP 140/78 | HR 95 | Temp 97.0°F | Resp 16 | Ht 74.5 in | Wt 316.2 lb

## 2020-10-01 DIAGNOSIS — R7303 Prediabetes: Secondary | ICD-10-CM

## 2020-10-01 DIAGNOSIS — Z0001 Encounter for general adult medical examination with abnormal findings: Secondary | ICD-10-CM | POA: Diagnosis not present

## 2020-10-01 DIAGNOSIS — I700 Atherosclerosis of aorta: Secondary | ICD-10-CM

## 2020-10-01 DIAGNOSIS — C9310 Chronic myelomonocytic leukemia not having achieved remission: Secondary | ICD-10-CM

## 2020-10-01 DIAGNOSIS — I10 Essential (primary) hypertension: Secondary | ICD-10-CM

## 2020-10-01 DIAGNOSIS — E669 Obesity, unspecified: Secondary | ICD-10-CM

## 2020-10-01 DIAGNOSIS — D509 Iron deficiency anemia, unspecified: Secondary | ICD-10-CM

## 2020-10-01 DIAGNOSIS — R300 Dysuria: Secondary | ICD-10-CM | POA: Diagnosis not present

## 2020-10-01 DIAGNOSIS — Z6841 Body Mass Index (BMI) 40.0 and over, adult: Secondary | ICD-10-CM

## 2020-10-01 DIAGNOSIS — C9311 Chronic myelomonocytic leukemia, in remission: Secondary | ICD-10-CM

## 2020-10-01 DIAGNOSIS — Z23 Encounter for immunization: Secondary | ICD-10-CM | POA: Diagnosis not present

## 2020-10-01 LAB — POCT GLYCOSYLATED HEMOGLOBIN (HGB A1C): Hemoglobin A1C: 5.6 % (ref 4.0–5.6)

## 2020-10-01 NOTE — Progress Notes (Signed)
[facility] [place], [place] [address]  Internal MEDICINE  Office Visit Note  Patient Name: Samuel Frank [address]  [id number]  PV:[id number]  Date of Service: 10/01/2020  Chief Complaint  Patient presents with   Medicare Wellness    refills   Anemia   Hypertension    HPI Kami presents for an annual well visit and physical exam. he has a history of anemia, leukemia, hypertension, GERD, and a hernia repair. He has frequent visits to the oncology clinic for chemotherapy infusions for leukemia. He has not achieved remission yet but he mentioned that he may be in remission soon. He is retired and used to work at [employer], [employer] and a country club. He denies any pain at this time. He has 3 daughters who are concerned about him. He does not need any medication refills today. He denies any other questions or concerns today as well. He gets monthly labs done as ordered by Dr. Janese Banks. He has agreed to get some other labs for his annual visit today that are not routinely ordered by Dr. Janese Banks.  He is not due for any routine screenings. He is due for a couple of recommended vaccines but he is currently on intravenous chemotherapy so that will be a discussion he will need to have with Dr. Janese Banks on whether he should get the vaccines or not and if he should get them, when should he get them.     Current Medication: Outpatient Encounter Medications as of 10/01/2020  Medication Sig   allopurinol (ZYLOPRIM) 300 MG tablet TAKE 1 TABLET BY MOUTH EVERY DAY   aspirin EC 81 MG tablet Take 81 mg by mouth daily.   furosemide (LASIX) 20 MG tablet Take 1 tablet (20 mg total) by mouth daily.   hydrochlorothiazide (MICROZIDE) 12.5 MG capsule TAKE 1 CAPSULE BY MOUTH EVERY DAY   pantoprazole (PROTONIX) 40 MG tablet Take 1 tablet (40 mg total) by mouth daily.   potassium chloride SA (KLOR-CON M20) 20 MEQ tablet Take 1 tablet (20 mEq total) by mouth 2 (two) times daily.   Tdap (BOOSTRIX) 5-2.5-18.5  LF-MCG/0.5 injection Inject into the muscle once.   traZODone (DESYREL) 50 MG tablet Take 1 tablet (50 mg total) by mouth at bedtime.   triamcinolone ointment (KENALOG) 0.5 % APPLY 1 APPLICATION TOPICALLY 2 TIMES DAILY. TO THE AFFECTED AREAS OF FEET AND LEFT SIDE OF STOMACH   valACYclovir (VALTREX) 500 MG tablet TAKE 1 TABLET BY MOUTH EVERY DAY   Zoster Vaccine Adjuvanted [facility]) injection Inject 0.5 mLs into the muscle once.   No facility-administered encounter medications on file as of 10/01/2020.    Surgical History: Past Surgical History:  Procedure Laterality Date   COLONOSCOPY N/A 08/28/2019   Procedure: COLONOSCOPY;  Surgeon: Toledo, Benay Pike, MD;  Location: [facility] ENDOSCOPY;  Service: Gastroenterology;  Laterality: N/A;   COLONOSCOPY WITH PROPOFOL N/A 05/03/2015   Procedure: COLONOSCOPY WITH PROPOFOL;  Surgeon: [name], MD;  Location: [facility] ENDOSCOPY;  Service: Gastroenterology;  Laterality: N/A;   ESOPHAGOGASTRODUODENOSCOPY N/A 08/28/2019   Procedure: ESOPHAGOGASTRODUODENOSCOPY (EGD);  Surgeon: Toledo, Benay Pike, MD;  Location: [facility] ENDOSCOPY;  Service: Gastroenterology;  Laterality: N/A;   ESOPHAGOGASTRODUODENOSCOPY (EGD) WITH PROPOFOL N/A 05/03/2015   Procedure: ESOPHAGOGASTRODUODENOSCOPY (EGD) WITH PROPOFOL;  Surgeon: [name], MD;  Location: [facility] ENDOSCOPY;  Service: Gastroenterology;  Laterality: N/A;   HERNIA REPAIR     1975    Medical History: Past Medical History:  Diagnosis Date   Anemia  CMML (chronic myelomonocytic leukemia) (HCC)    CMML (chronic myelomonocytic leukemia) (HCC)    Dyspnea    Hypertension     Family History: Family History  Problem Relation Age of Onset   Alzheimer's disease Father    Lung cancer Sister    Cancer Sister     Social History   Socioeconomic History   Marital status: Divorced    Spouse name: Not on file   Number of children: 4   Years of education: Not on file   Highest education level: Not on file  Occupational  History   Occupation: retired    Comment: [employer]  Tobacco Use   Smoking status: Never   Smokeless tobacco: Never  Vaping Use   Vaping Use: Never used  Substance and Sexual Activity   Alcohol use: No   Drug use: No   Sexual activity: Not Currently  Other Topics Concern   Not on file  Social History Narrative   Not on file   Social Determinants of Health   Financial Resource Strain: Low Risk    Difficulty of Paying Living Expenses: Not very hard  Food Insecurity: Not on file  Transportation Needs: Not on file  Physical Activity: Not on file  Stress: Not on file  Social Connections: Not on file  Intimate Partner Violence: Not on file      Review of Systems  Constitutional:  Negative for activity change, appetite change, chills, fatigue, fever and unexpected weight change.  HENT: Negative.  Negative for congestion, ear pain, rhinorrhea, sore throat and trouble swallowing.   Eyes: Negative.   Respiratory: Negative.  Negative for cough, chest tightness, shortness of breath and wheezing.   Cardiovascular: Negative.  Negative for chest pain.  Gastrointestinal: Negative.  Negative for abdominal pain, blood in stool, constipation, diarrhea, nausea and vomiting.  Endocrine: Negative.   Genitourinary: Negative.  Negative for difficulty urinating, dysuria, frequency, hematuria and urgency.  Musculoskeletal: Negative.  Negative for arthralgias, back pain, joint swelling, myalgias and neck pain.  Skin: Negative.  Negative for rash and wound.  Allergic/Immunologic: Negative.  Negative for immunocompromised state.  Neurological: Negative.  Negative for dizziness, seizures, numbness and headaches.  Hematological: Negative.   Psychiatric/Behavioral: Negative.  Negative for behavioral problems, self-injury and suicidal ideas. The patient is not nervous/anxious.    Vital Signs: BP 140/78 Comment: 146/76  Pulse 95   Temp (!) 97 F (36.1 C)   Resp 16   Ht 6' 2.5"  (1.892 m)   Wt (!) 316 lb 3.2 oz (143.4 kg)   SpO2 98%   BMI 40.05 kg/m    Physical Exam Vitals reviewed.  Constitutional:      General: He is not in acute distress.    Appearance: Normal appearance. He is well-developed and well-groomed. He is morbidly obese. He is not ill-appearing.  HENT:     Head: Normocephalic and atraumatic.     Right Ear: Tympanic membrane, ear canal and external ear normal.     Left Ear: Tympanic membrane, ear canal and external ear normal.     Nose: Nose normal.     Mouth/Throat:     Mouth: Mucous membranes are moist.     Pharynx: Oropharynx is clear.  Eyes:     Extraocular Movements: Extraocular movements intact.     Conjunctiva/sclera: Conjunctivae normal.     Pupils: Pupils are equal, round, and reactive to light.  Neck:     Vascular: No carotid bruit.  Cardiovascular:  Rate and Rhythm: Normal rate and regular rhythm.     Pulses: Normal pulses.     Heart sounds: Normal heart sounds. No murmur heard. Pulmonary:     Effort: Pulmonary effort is normal. No respiratory distress.     Breath sounds: Normal breath sounds. No wheezing.  Abdominal:     General: Bowel sounds are normal. There is no distension.     Palpations: Abdomen is soft. There is no mass.     Tenderness: There is no abdominal tenderness. There is no guarding or rebound.     Hernia: No hernia is present.  Musculoskeletal:        General: Normal range of motion.     Right lower leg: Edema present.     Left lower leg: Edema present.  Lymphadenopathy:     Cervical: No cervical adenopathy.  Skin:    General: Skin is warm and dry.     Capillary Refill: Capillary refill takes less than 2 seconds.  Neurological:     Mental Status: He is alert and oriented to person, place, and time.  Psychiatric:        Mood and Affect: Mood normal.        Behavior: Behavior normal. Behavior is cooperative.        Thought Content: Thought content normal.        Judgment: Judgment normal.      Assessment/Plan: 1. Encounter for general adult medical examination with abnormal findings Age-appropriate preventive screenings discussed, annual physical exam completed. Routine labs for health maintenance ordered, except for CBC and CMP since those are drawn monthly per Dr. Janese Banks.    2. 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 Janese Banks. Patient reports that Dr. Janese Banks told him if his numbers stayed the same next month, he may be able to take a break from chemo for a while.   3. Atherosclerosis of aorta ([place]) He has not had his cholesterol levels checked for a significant period of time, lab ordered. Will discuss obtaining a carotid ultrasound when he is taking a break from chemo.  - Lipid Profile  4. Pre-diabetes A1C was 6.4 when checked 2 years ago. The patient has not been on any medications for elevated glucose levels and his A1C has not been routinely monitored since it was checked 2 years ago. A1C rechecked today and it was 5.6. Will continue to monitor with in-office testing or lab-collected blood draw. Labs ordered to check his thyroid levels as well.  - POCT HgB A1C - TSH + free T4  5. Primary hypertension Romar is taking furosemide and hydrochlorothiazide, blood pressure is stable at this time.   6. Iron deficiency anemia, unspecified iron deficiency anemia type Patient has had anemia in the past with iron deficiency, labs ordered to assess for vitamin deficiency as well.  - B12 and Folate Panel - Vitamin D (25 hydroxy)  7. Morbid obesity with body mass index (BMI) of 40.0 to 44.9 in adult [facility]) Patient is aware of his elevated weight and BMI. He understands the benefits of weight loss. Currently, with chemotherapy, he is often at the oncology clinic for infusions and chemotherapy side effects can make him feel tired and fatigue but he reports that he tries to be active when he can and he  eats as health as he can as well.   8. Dysuria Routine urinalysis done. - UA/M w/rflx Culture, Routine - Microscopic Examination  9. Need for vaccination  Shingles vaccine and tetanus booster are due but patient is receiving chemotherapy managed by Dr. Janese Banks. He will discuss with Dr. Janese Banks and let her team determine if and when he can receive recommended vaccinations.     General Counseling: aurelio soderman understanding of the findings of todays visit and agrees with plan of treatment. I have discussed any further diagnostic evaluation that may be needed or ordered today. We also reviewed his medications today. he has been encouraged to call the office with any questions or concerns that should arise related to todays visit.    Orders Placed This Encounter  Procedures   Microscopic Examination   UA/M w/rflx Culture, Routine   Lipid Profile   B12 and Folate Panel   Vitamin D (25 hydroxy)   TSH + free T4   POCT HgB A1C    No orders of the defined types were placed in this encounter.   Return in about 1 year (around 10/01/2021) for CPE, [place] PCP.   Total time spent:30 Minutes Time spent includes review of chart, medications, test results, and follow up plan with the patient.   [place] Controlled Substance Database was reviewed by me.  This patient was seen by Jonetta Osgood, FNP-C in collaboration with Dr. Clayborn Bigness as a part of collaborative care agreement.  Aithana Kushner R. Valetta Fuller, MSN, FNP-C Internal medicine

## 2020-10-02 LAB — UA/M W/RFLX CULTURE, ROUTINE
Bilirubin, UA: NEGATIVE
Glucose, UA: NEGATIVE
Ketones, UA: NEGATIVE
Leukocytes,UA: NEGATIVE
Nitrite, UA: NEGATIVE
RBC, UA: NEGATIVE
Specific Gravity, UA: 1.023 (ref 1.005–1.030)
Urobilinogen, Ur: 0.2 mg/dL (ref 0.2–1.0)
pH, UA: 5.5 (ref 5.0–7.5)

## 2020-10-02 LAB — MICROSCOPIC EXAMINATION
Bacteria, UA: NONE SEEN
Casts: NONE SEEN /lpf
Epithelial Cells (non renal): NONE SEEN /hpf (ref 0–10)

## 2020-10-03 ENCOUNTER — Encounter: Payer: Self-pay | Admitting: Nurse Practitioner

## 2020-10-03 DIAGNOSIS — R7303 Prediabetes: Secondary | ICD-10-CM | POA: Insufficient documentation

## 2020-10-03 DIAGNOSIS — I10 Essential (primary) hypertension: Secondary | ICD-10-CM | POA: Diagnosis not present

## 2020-10-03 DIAGNOSIS — K219 Gastro-esophageal reflux disease without esophagitis: Secondary | ICD-10-CM | POA: Diagnosis not present

## 2020-10-04 ENCOUNTER — Encounter: Payer: Self-pay | Admitting: Oncology

## 2020-10-04 ENCOUNTER — Inpatient Hospital Stay (HOSPITAL_BASED_OUTPATIENT_CLINIC_OR_DEPARTMENT_OTHER): Payer: Medicare Other | Admitting: Oncology

## 2020-10-04 ENCOUNTER — Inpatient Hospital Stay: Payer: Medicare Other

## 2020-10-04 ENCOUNTER — Inpatient Hospital Stay: Payer: Medicare Other | Attending: Oncology

## 2020-10-04 ENCOUNTER — Other Ambulatory Visit: Payer: Self-pay

## 2020-10-04 VITALS — BP 127/69 | HR 85 | Temp 97.4°F | Resp 20 | Wt 321.6 lb

## 2020-10-04 DIAGNOSIS — C9311 Chronic myelomonocytic leukemia, in remission: Secondary | ICD-10-CM | POA: Diagnosis not present

## 2020-10-04 DIAGNOSIS — C9310 Chronic myelomonocytic leukemia not having achieved remission: Secondary | ICD-10-CM

## 2020-10-04 DIAGNOSIS — Z5111 Encounter for antineoplastic chemotherapy: Secondary | ICD-10-CM | POA: Insufficient documentation

## 2020-10-04 DIAGNOSIS — Z79899 Other long term (current) drug therapy: Secondary | ICD-10-CM | POA: Insufficient documentation

## 2020-10-04 LAB — COMPREHENSIVE METABOLIC PANEL
ALT: 15 U/L (ref 0–44)
AST: 16 U/L (ref 15–41)
Albumin: 4 g/dL (ref 3.5–5.0)
Alkaline Phosphatase: 74 U/L (ref 38–126)
Anion gap: 7 (ref 5–15)
BUN: 12 mg/dL (ref 8–23)
CO2: 27 mmol/L (ref 22–32)
Calcium: 8.4 mg/dL — ABNORMAL LOW (ref 8.9–10.3)
Chloride: 103 mmol/L (ref 98–111)
Creatinine, Ser: 0.81 mg/dL (ref 0.61–1.24)
GFR, Estimated: >60 mL/min (ref >60)
Glucose, Bld: 100 mg/dL — ABNORMAL HIGH (ref 70–99)
Potassium: 3.7 mmol/L (ref 3.5–5.1)
Sodium: 137 mmol/L (ref 135–145)
Total Bilirubin: 0.6 mg/dL (ref 0.3–1.2)
Total Protein: 7.2 g/dL (ref 6.5–8.1)

## 2020-10-04 LAB — CBC WITH DIFFERENTIAL/PLATELET
Abs Immature Granulocytes: 1.87 10*3/uL — ABNORMAL HIGH (ref 0.00–0.07)
Basophils Absolute: 0.3 10*3/uL — ABNORMAL HIGH (ref 0.0–0.1)
Basophils Relative: 2 %
Eosinophils Absolute: 0.3 10*3/uL (ref 0.0–0.5)
Eosinophils Relative: 2 %
HCT: 45.8 % (ref 39.0–52.0)
Hemoglobin: 14.5 g/dL (ref 13.0–17.0)
Immature Granulocytes: 10 %
Lymphocytes Relative: 11 %
Lymphs Abs: 2 10*3/uL (ref 0.7–4.0)
MCH: 27.8 pg (ref 26.0–34.0)
MCHC: 31.7 g/dL (ref 30.0–36.0)
MCV: 87.7 fL (ref 80.0–100.0)
Monocytes Absolute: 1.6 10*3/uL — ABNORMAL HIGH (ref 0.1–1.0)
Monocytes Relative: 9 %
Neutro Abs: 12.9 10*3/uL — ABNORMAL HIGH (ref 1.7–7.7)
Neutrophils Relative %: 66 %
Platelets: 270 10*3/uL (ref 150–400)
RBC: 5.22 MIL/uL (ref 4.22–5.81)
RDW: 19.4 % — ABNORMAL HIGH (ref 11.5–15.5)
Smear Review: ADEQUATE
WBC: 19 10*3/uL — ABNORMAL HIGH (ref 4.0–10.5)
nRBC: 0.2 % (ref 0.0–0.2)

## 2020-10-04 MED ORDER — HYDROCHLOROTHIAZIDE 12.5 MG PO CAPS: ORAL_CAPSULE | ORAL | 5 refills | Status: DC

## 2020-10-04 MED ORDER — ONDANSETRON HCL 4 MG PO TABS
8.0000 mg | ORAL_TABLET | Freq: Once | ORAL | Status: AC
  Administered 2020-10-04: 8 mg via ORAL
  Filled 2020-10-04: qty 2

## 2020-10-04 MED ORDER — AZACITIDINE CHEMO SQ INJECTION
50.0000 mg/m2 | Freq: Once | INTRAMUSCULAR | Status: AC
  Administered 2020-10-04: 132.5 mg via SUBCUTANEOUS
  Filled 2020-10-04: qty 5.3

## 2020-10-04 NOTE — Progress Notes (Signed)
Hematology/Oncology Consult note [facility]  Telephone:([phone] [phone]  Patient Care Team: Lavera Guise, MD as PCP - General (Internal Medicine) Sindy Guadeloupe, MD as Consulting Physician (Oncology) Edythe Clarity, [facility] as Pharmacist (Pharmacist)   Name of the patient: Samuel Frank  [id number]  1940-11-27   Date of visit: 10/04/20  Diagnosis-CMML 1  Chief complaint/ Reason for visit-on treatment assessment prior to next cycle of Vidaza  Heme/Onc history: Patient is a 80 yr old male with no significant co morbidities other than hypertension.  He has recently been having bilateral knee pain and has undergone steroid injection in his knee.Patient was noted to have a high white count on his routine exam.  His white count was 19.2 on 07/22/2018 with an H&H of 11.4/35.1 and a platelet count of 470.  At that point he was prescribed a course of antibiotics and a repeat blood work on 08/21/2018 showed white count of 42.3, H&H of 7.1/23 with an MCV of 84 and a platelet count of 791 and hence the patient was referred to hematology for further management.     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  In addition the core biopsy showed a small focus of fibrosis containing scattering of mast cells and eosinophils most suggestive of systemic mastocytosis.bone marrow blasts 7%.  Flow cytometry showed increased number of monocytic cells representing 16% of all cells with expression for HLA-DR, CD11b, CD13, CD14, CD33, CD38, CD56 and CD56 and CD64 with [facility] CD 117 are CD34. normal cytogenetics, CBL, SRSF2, SH2B3 and TET2    Repeat bone marrow biopsy after 4 cycles of Vidaza showed less pronounced monocytic/granulocytic proliferation and overall better granulopoiesis and no increase in blasts.  In addition features of systemic mastocytosis not present.   After 9 cycles of Vidaza patient was noted to have  significant anemia and worsening thrombocytosis for which a repeat bone marrow biopsy was done.  Bone marrow did not show any features of progression of CMML.  Overall stable findings and no increased blast.He was found to have iron deficiency and received 2 doses of Feraheme.   Patient seen by [facility] clinic GI and underwent EGD and colonoscopy for iron deficiency anemia.Colonoscopy showed nonbleeding internal hemorrhoids.  Normal mucosa in the colon.  No polyps EGD showed normal esophagus and gastric mucosal atrophy.  No stigmata of bleeding     Interval history-patient is doing well and denies any specific complaints at this time.  He has waxing and waning bilateral leg swelling which is chronic.  ECOG PS- 1 Pain scale- 0  Review of systems- Review of Systems  Constitutional:  Negative for chills, fever, malaise/fatigue and weight loss.  HENT:  Negative for congestion, ear discharge and nosebleeds.   Eyes:  Negative for blurred vision.  Respiratory:  Negative for cough, hemoptysis, sputum production, shortness of breath and wheezing.   Cardiovascular:  Negative for chest pain, palpitations, orthopnea and claudication.  Gastrointestinal:  Negative for abdominal pain, blood in stool, constipation, diarrhea, heartburn, melena, nausea and vomiting.  Genitourinary:  Negative for dysuria, flank pain, frequency, hematuria and urgency.  Musculoskeletal:  Negative for back pain, joint pain and myalgias.  Skin:  Negative for rash.  Neurological:  Negative for dizziness, tingling, focal weakness, seizures, weakness and headaches.  Endo/Heme/Allergies:  Does not bruise/bleed easily.  Psychiatric/Behavioral:  Negative for depression and suicidal ideas. The patient does not have insomnia.      No  Known Allergies   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[place])    CMML (chronic myelomonocytic leukemia) ([place])    Dyspnea    Hypertension      Past  Surgical History:  Procedure Laterality Date   COLONOSCOPY N/A 08/28/2019   Procedure: COLONOSCOPY;  Surgeon: Toledo, Benay Pike, MD;  Location: [facility] ENDOSCOPY;  Service: Gastroenterology;  Laterality: N/A;   COLONOSCOPY WITH PROPOFOL N/A 05/03/2015   Procedure: COLONOSCOPY WITH PROPOFOL;  Surgeon: [name], MD;  Location: [facility] ENDOSCOPY;  Service: Gastroenterology;  Laterality: N/A;   ESOPHAGOGASTRODUODENOSCOPY N/A 08/28/2019   Procedure: ESOPHAGOGASTRODUODENOSCOPY (EGD);  Surgeon: Toledo, Benay Pike, MD;  Location: [facility] ENDOSCOPY;  Service: Gastroenterology;  Laterality: N/A;   ESOPHAGOGASTRODUODENOSCOPY (EGD) WITH PROPOFOL N/A 05/03/2015   Procedure: ESOPHAGOGASTRODUODENOSCOPY (EGD) WITH PROPOFOL;  Surgeon: [name], MD;  Location: [facility] ENDOSCOPY;  Service: Gastroenterology;  Laterality: N/A;   HERNIA REPAIR     1975    Social History   Socioeconomic History   Marital status: Divorced    Spouse name: Not on file   Number of children: 4   Years of education: Not on file   Highest education level: Not on file  Occupational History   Occupation: retired    Comment: [employer]  Tobacco Use   Smoking status: Never   Smokeless tobacco: Never  Vaping Use   Vaping Use: Never used  Substance and Sexual Activity   Alcohol use: No   Drug use: No   Sexual activity: Not Currently  Other Topics Concern   Not on file  Social History Narrative   Not on file   Social Determinants of Health   Financial Resource Strain: Low Risk    Difficulty of Paying Living Expenses: Not very hard  Food Insecurity: Not on file  Transportation Needs: Not on file  Physical Activity: Not on file  Stress: Not on file  Social Connections: Not on file  Intimate Partner Violence: Not on file    Family History  Problem Relation Age of Onset   Alzheimer's disease Father    Lung cancer Sister    Cancer Sister      Current Outpatient Medications:    allopurinol (ZYLOPRIM) 300 MG tablet,  TAKE 1 TABLET BY MOUTH EVERY DAY, Disp: 90 tablet, Rfl: 1   aspirin EC 81 MG tablet, Take 81 mg by mouth daily., Disp: , Rfl:    furosemide (LASIX) 20 MG tablet, Take 1 tablet (20 mg total) by mouth daily., Disp: 5 tablet, Rfl: 0   pantoprazole (PROTONIX) 40 MG tablet, Take 1 tablet (40 mg total) by mouth daily., Disp: 90 tablet, Rfl: 0   potassium chloride SA (KLOR-CON M20) 20 MEQ tablet, Take 1 tablet (20 mEq total) by mouth 2 (two) times daily., Disp: 180 tablet, Rfl: 1   Tdap (BOOSTRIX) 5-2.5-18.5 LF-MCG/0.5 injection, Inject into the muscle once., Disp: , Rfl:    traZODone (DESYREL) 50 MG tablet, Take 1 tablet (50 mg total) by mouth at bedtime., Disp: 30 tablet, Rfl: 1   triamcinolone ointment (KENALOG) 0.5 %, APPLY 1 APPLICATION TOPICALLY 2 TIMES DAILY. TO THE AFFECTED AREAS OF FEET AND LEFT SIDE OF STOMACH, Disp: 30 g, Rfl: 0   valACYclovir (VALTREX) 500 MG tablet, TAKE 1 TABLET BY MOUTH EVERY DAY, Disp: 90 tablet, Rfl: 1   Zoster Vaccine Adjuvanted (SHINGRIX) injection, Inject 0.5 mLs into the muscle once., Disp: , Rfl:    hydrochlorothiazide (  MICROZIDE) 12.5 MG capsule, TAKE 1 CAPSULE BY MOUTH EVERY DAY, Disp: 30 capsule, Rfl: 5  Physical exam:  Vitals:   10/04/20 1021  BP: 127/69  Pulse: 85  Resp: 20  Temp: (!) 97.4 F (36.3 C)  TempSrc: Tympanic  SpO2: 100%  Weight: (!) 321 lb 9.6 oz (145.9 kg)   Physical Exam Constitutional:      General: He is not in acute distress. Cardiovascular:     Rate and Rhythm: Normal rate and regular rhythm.     Heart sounds: Normal heart sounds.  Pulmonary:     Effort: Pulmonary effort is normal.     Breath sounds: Normal breath sounds.  Abdominal:     General: Bowel sounds are normal.     Palpations: Abdomen is soft.  Musculoskeletal:     Right lower leg: Edema present.     Left lower leg: Edema present.     Comments: Right greater than left edema  Skin:    General: Skin is warm and dry.  Neurological:     Mental Status: He is alert  and oriented to person, place, and time.     CMP Latest Ref Rng & Units 10/04/2020  Glucose 70 - 99 mg/dL 100(H)  BUN 8 - 23 mg/dL 12  Creatinine 0.61 - 1.24 mg/dL 0.81  Sodium 135 - 145 mmol/L 137  Potassium 3.5 - 5.1 mmol/L 3.7  Chloride 98 - 111 mmol/L 103  CO2 22 - 32 mmol/L 27  Calcium 8.9 - 10.3 mg/dL 8.4(L)  Total Protein 6.5 - 8.1 g/dL 7.2  Total Bilirubin 0.3 - 1.2 mg/dL 0.6  Alkaline Phos 38 - 126 U/L 74  AST 15 - 41 U/L 16  ALT 0 - 44 U/L 15   CBC Latest Ref Rng & Units 10/04/2020  WBC 4.0 - 10.5 K/uL 19.0(H)  Hemoglobin 13.0 - 17.0 g/dL 14.5  Hematocrit 39.0 - 52.0 % 45.8  Platelets 150 - 400 K/uL 270     Assessment and plan- Patient is a 80 y.o. male with history of CMML 1 here for on treatment assessment prior to next cycle of Vidaza  Counts okay to proceed with cycle 25 of Vidaza today.  He is tolerating treatment well and hemoglobin and platelet counts continue to be normal.  White count is elevated at 19 today as compared to his baseline And looking back at his recent trends it has been between 12-19 in the last 3 months.  If there is a continued rising trend I will repeat a bone marrow biopsy at that time.  I will tentatively see him back in 4 weeks for cycle 26.  Plan is to continue [place] until progression or toxicity   Visit Diagnosis 1. Encounter for antineoplastic chemotherapy   2. Chronic myelomonocytic leukemia in remission [facility])      Dr. Randa Evens, MD, MPH [facility] at [facility] [phone] 10/04/2020 1:03 PM

## 2020-10-04 NOTE — Patient Instructions (Signed)
[place] ONCOLOGY  Discharge Instructions: Thank you for choosing [facility] to provide your oncology and hematology care.  If you have a lab appointment with the [facility], please go directly to the [facility] and check in at the registration area.  Wear comfortable clothing and clothing appropriate for easy access to any Portacath or PICC line.   We strive to give you quality time with your provider. You may need to reschedule your appointment if you arrive late (15 or more minutes).  Arriving late affects you and other patients whose appointments are after yours.  Also, if you miss three or more appointments without notifying the office, you may be dismissed from the clinic at the provider's discretion.      For prescription refill requests, have your pharmacy contact our office and allow 72 hours for refills to be completed.    Today you received the following chemotherapy and/or immunotherapy agents  Vidaza   To help prevent nausea and vomiting after your treatment, we encourage you to take your nausea medication as directed.  BELOW ARE SYMPTOMS THAT SHOULD BE REPORTED IMMEDIATELY: *FEVER GREATER THAN 100.4 F (38 C) OR HIGHER *CHILLS OR SWEATING *NAUSEA AND VOMITING THAT IS NOT CONTROLLED WITH YOUR NAUSEA MEDICATION *UNUSUAL SHORTNESS OF BREATH *UNUSUAL BRUISING OR BLEEDING *URINARY PROBLEMS (pain or burning when urinating, or frequent urination) *BOWEL PROBLEMS (unusual diarrhea, constipation, pain near the anus) TENDERNESS IN MOUTH AND THROAT WITH OR WITHOUT PRESENCE OF ULCERS (sore throat, sores in mouth, or a toothache) UNUSUAL RASH, SWELLING OR PAIN  UNUSUAL VAGINAL DISCHARGE OR ITCHING   Items with * indicate a potential emergency and should be followed up as soon as possible or go to the Emergency Department if any problems should occur.  Please show the CHEMOTHERAPY ALERT CARD or IMMUNOTHERAPY ALERT CARD at check-in to the  Emergency Department and triage nurse.  Should you have questions after your visit or need to cancel or reschedule your appointment, please contact [facility]  [phone] and follow the prompts.  Office hours are 8:00 a.m. to 4:30 p.m. Monday - Friday. Please note that voicemails left after 4:00 p.m. may not be returned until the following business day.  We are closed weekends and major holidays. You have access to a nurse at all times for urgent questions. Please call the main number to the clinic [phone] and follow the prompts.  For any non-urgent questions, you may also contact your provider using MyChart. We now offer e-Visits for anyone 73 and older to request care online for non-urgent symptoms. For details visit [email or web address].   Also download the MyChart app! Go to the app store, search "MyChart", open the app, select [facility], and log in with your MyChart username and password.  Due to Covid, a mask is required upon entering the hospital/clinic. If you do not have a mask, one will be given to you upon arrival. For doctor visits, patients may have 1 support person aged 29 or older with them. For treatment visits, patients cannot have anyone with them due to current Covid guidelines and our immunocompromised population.

## 2020-10-05 ENCOUNTER — Inpatient Hospital Stay: Payer: Medicare Other

## 2020-10-05 VITALS — BP 115/62 | HR 84 | Temp 96.6°F | Resp 16

## 2020-10-05 DIAGNOSIS — Z5111 Encounter for antineoplastic chemotherapy: Secondary | ICD-10-CM | POA: Diagnosis not present

## 2020-10-05 DIAGNOSIS — C9311 Chronic myelomonocytic leukemia, in remission: Secondary | ICD-10-CM | POA: Diagnosis not present

## 2020-10-05 DIAGNOSIS — C9310 Chronic myelomonocytic leukemia not having achieved remission: Secondary | ICD-10-CM

## 2020-10-05 DIAGNOSIS — Z79899 Other long term (current) drug therapy: Secondary | ICD-10-CM | POA: Diagnosis not present

## 2020-10-05 MED ORDER — AZACITIDINE CHEMO SQ INJECTION
50.0000 mg/m2 | Freq: Once | INTRAMUSCULAR | Status: AC
  Administered 2020-10-05: 132.5 mg via SUBCUTANEOUS
  Filled 2020-10-05: qty 5.3

## 2020-10-05 MED ORDER — ONDANSETRON HCL 4 MG PO TABS
8.0000 mg | ORAL_TABLET | Freq: Once | ORAL | Status: AC
  Administered 2020-10-05: 8 mg via ORAL
  Filled 2020-10-05: qty 2

## 2020-10-05 NOTE — Patient Instructions (Signed)
[facility]COLOG[facility]  Discharge Instructions: Thank you for choosing [facility] to provide your oncology and hematology care.  If you have a lab appointment with the [facility], please go directly to the [facility] and check in at the registration area.  Wear comfortable clothing and clothing appropriate for easy access to any Portacath or PICC line.   We strive to give you quality time with your provider. You may need to reschedule your appointment if you arrive late (15 or more minutes).  Arriving late affects you and other patients whose appointments are after yours.  Also, if you miss three or more appointments without notifying the office, you may be dismissed from the clinic at the provider's discretion.      For prescription refill requests, have your pharmacy contact our office and allow 72 hours for refills to be completed.    Today you received the following chemotherapy and/or immunotherapy agents : Vidaza   To help prevent nausea and vomiting after your treatment, we encourage you to take your nausea medication as directed.  BELOW ARE SYMPTOMS THAT SHOULD BE REPORTED IMMEDIATELY: *FEVER GREATER THAN 100.4 F (38 C) OR HIGHER *CHILLS OR SWEATING *NAUSEA AND VOMITING THAT IS NOT CONTROLLED WITH YOUR NAUSEA MEDICATION *UNUSUAL SHORTNESS OF BREATH *UNUSUAL BRUISING OR BLEEDING *URINARY PROBLEMS (pain or burning when urinating, or frequent urination) *BOWEL PROBLEMS (unusual diarrhea, constipation, pain near the anus) TENDERNESS IN MOUTH AND THROAT WITH OR WITHOUT PRESENCE OF ULCERS (sore throat, sores in mouth, or a toothache) UNUSUAL RASH, SWELLING OR PAIN  UNUSUAL VAGINAL DISCHARGE OR ITCHING   Items with * indicate a potential emergency and should be followed up as soon as possible or go to the Emergency Department if any problems should occur.  Please show the CHEMOTHERAPY ALERT CARD or IMMUNOTHERAPY ALERT CARD at check-in to  the Emergency Department and triage nurse.  Should you have questions after your visit or need to cancel or reschedule your appointment, please contact [facility]  [phone] and follow the prompts.  Office hours are 8:00 a.m. to 4:30 p.m. Monday - Friday. Please note that voicemails left after 4:00 p.m. may not be returned until the following business day.  We are closed weekends and major holidays. You have access to a nurse at all times for urgent questions. Please call the main number to the clinic [phone] and follow the prompts.  For any non-urgent questions, you may also contact your provider using MyChart. We now offer e-Visits for anyone 85 and older to request care online for non-urgent symptoms. For details visit [email or web address].   Also download the MyChart app! Go to the app store, search "MyChart", open the app, select [facility], and log in with your MyChart username and password.  Due to Covid, a mask is required upon entering the hospital/clinic. If you do not have a mask, one will be given to you upon arrival. For doctor visits, patients may have 1 support person aged 71 or older with them. For treatment visits, patients cannot have anyone with them due to current Covid guidelines and our immunocompromised population.

## 2020-10-06 ENCOUNTER — Inpatient Hospital Stay: Payer: Medicare Other

## 2020-10-06 ENCOUNTER — Other Ambulatory Visit: Payer: Self-pay

## 2020-10-06 ENCOUNTER — Other Ambulatory Visit: Payer: Self-pay | Admitting: Oncology

## 2020-10-06 VITALS — BP 124/75 | HR 91 | Temp 98.0°F | Resp 20

## 2020-10-06 DIAGNOSIS — C9311 Chronic myelomonocytic leukemia, in remission: Secondary | ICD-10-CM | POA: Diagnosis not present

## 2020-10-06 DIAGNOSIS — Z79899 Other long term (current) drug therapy: Secondary | ICD-10-CM | POA: Diagnosis not present

## 2020-10-06 DIAGNOSIS — C9310 Chronic myelomonocytic leukemia not having achieved remission: Secondary | ICD-10-CM

## 2020-10-06 DIAGNOSIS — Z5111 Encounter for antineoplastic chemotherapy: Secondary | ICD-10-CM | POA: Diagnosis not present

## 2020-10-06 MED ORDER — ONDANSETRON HCL 4 MG PO TABS
8.0000 mg | ORAL_TABLET | Freq: Once | ORAL | Status: AC
  Administered 2020-10-06: 8 mg via ORAL
  Filled 2020-10-06: qty 2

## 2020-10-06 MED ORDER — HYDROCHLOROTHIAZIDE 12.5 MG PO CAPS: ORAL_CAPSULE | ORAL | 5 refills | Status: DC

## 2020-10-06 MED ORDER — AZACITIDINE CHEMO SQ INJECTION
50.0000 mg/m2 | Freq: Once | INTRAMUSCULAR | Status: AC
  Administered 2020-10-06: 132.5 mg via SUBCUTANEOUS
  Filled 2020-10-06: qty 5.3

## 2020-10-06 NOTE — Patient Instructions (Addendum)
[facility]COLOG[facility]  Discharge Instructions: Thank you for choosing [facility] to provide your oncology and hematology care.  If you have a lab appointment with the [facility], please go directly to the [facility] and check in at the registration area.  Wear comfortable clothing and clothing appropriate for easy access to any Portacath or PICC line.   We strive to give you quality time with your provider. You may need to reschedule your appointment if you arrive late (15 or more minutes).  Arriving late affects you and other patients whose appointments are after yours.  Also, if you miss three or more appointments without notifying the office, you may be dismissed from the clinic at the provider's discretion.      For prescription refill requests, have your pharmacy contact our office and allow 72 hours for refills to be completed.    Today you received the following chemotherapy and/or immunotherapy agents vidaza   To help prevent nausea and vomiting after your treatment, we encourage you to take your nausea medication as directed.  BELOW ARE SYMPTOMS THAT SHOULD BE REPORTED IMMEDIATELY: *FEVER GREATER THAN 100.4 F (38 C) OR HIGHER *CHILLS OR SWEATING *NAUSEA AND VOMITING THAT IS NOT CONTROLLED WITH YOUR NAUSEA MEDICATION *UNUSUAL SHORTNESS OF BREATH *UNUSUAL BRUISING OR BLEEDING *URINARY PROBLEMS (pain or burning when urinating, or frequent urination) *BOWEL PROBLEMS (unusual diarrhea, constipation, pain near the anus) TENDERNESS IN MOUTH AND THROAT WITH OR WITHOUT PRESENCE OF ULCERS (sore throat, sores in mouth, or a toothache) UNUSUAL RASH, SWELLING OR PAIN  UNUSUAL VAGINAL DISCHARGE OR ITCHING   Items with * indicate a potential emergency and should be followed up as soon as possible or go to the Emergency Department if any problems should occur.  Please show the CHEMOTHERAPY ALERT CARD or IMMUNOTHERAPY ALERT CARD at check-in to the  Emergency Department and triage nurse.  Should you have questions after your visit or need to cancel or reschedule your appointment, please contact [facility]  [phone] and follow the prompts.  Office hours are 8:00 a.m. to 4:30 p.m. Monday - Friday. Please note that voicemails left after 4:00 p.m. may not be returned until the following business day.  We are closed weekends and major holidays. You have access to a nurse at all times for urgent questions. Please call the main number to the clinic [phone] and follow the prompts.  For any non-urgent questions, you may also contact your provider using MyChart. We now offer e-Visits for anyone 77 and older to request care online for non-urgent symptoms. For details visit [email or web address].   Also download the MyChart app! Go to the app store, search "MyChart", open the app, select [facility], and log in with your MyChart username and password.  Due to Covid, a mask is required upon entering the hospital/clinic. If you do not have a mask, one will be given to you upon arrival. For doctor visits, patients may have 1 support person aged 54 or older with them. For treatment visits, patients cannot have anyone with them due to current Covid guidelines and our immunocompromised population.

## 2020-10-07 ENCOUNTER — Inpatient Hospital Stay: Payer: Medicare Other

## 2020-10-07 VITALS — BP 135/68 | HR 96 | Temp 96.2°F | Resp 18

## 2020-10-07 DIAGNOSIS — Z5111 Encounter for antineoplastic chemotherapy: Secondary | ICD-10-CM | POA: Diagnosis not present

## 2020-10-07 DIAGNOSIS — C9310 Chronic myelomonocytic leukemia not having achieved remission: Secondary | ICD-10-CM

## 2020-10-07 DIAGNOSIS — Z79899 Other long term (current) drug therapy: Secondary | ICD-10-CM | POA: Diagnosis not present

## 2020-10-07 DIAGNOSIS — C9311 Chronic myelomonocytic leukemia, in remission: Secondary | ICD-10-CM | POA: Diagnosis not present

## 2020-10-07 MED ORDER — ONDANSETRON HCL 4 MG PO TABS
8.0000 mg | ORAL_TABLET | Freq: Once | ORAL | Status: AC
  Administered 2020-10-07: 8 mg via ORAL
  Filled 2020-10-07: qty 2

## 2020-10-07 MED ORDER — AZACITIDINE CHEMO SQ INJECTION
50.0000 mg/m2 | Freq: Once | INTRAMUSCULAR | Status: AC
  Administered 2020-10-07: 132.5 mg via SUBCUTANEOUS
  Filled 2020-10-07: qty 5.3

## 2020-10-07 NOTE — Patient Instructions (Signed)
[facility]  Discharge Instructions: Thank you for choosing [facility] to provide your oncology and hematology care.  If you have a lab appointment with the [facility], please go directly to the [facility] and check in at the registration area.  Wear comfortable clothing and clothing appropriate for easy access to any Portacath or PICC line.   We strive to give you quality time with your provider. You may need to reschedule your appointment if you arrive late (15 or more minutes).  Arriving late affects you and other patients whose appointments are after yours.  Also, if you miss three or more appointments without notifying the office, you may be dismissed from the clinic at the provider's discretion.      For prescription refill requests, have your pharmacy contact our office and allow 72 hours for refills to be completed.    Today you received the following chemotherapy and/or immunotherapy agents Vidaza      To help prevent nausea and vomiting after your treatment, we encourage you to take your nausea medication as directed.  BELOW ARE SYMPTOMS THAT SHOULD BE REPORTED IMMEDIATELY: *FEVER GREATER THAN 100.4 F (38 C) OR HIGHER *CHILLS OR SWEATING *NAUSEA AND VOMITING THAT IS NOT CONTROLLED WITH YOUR NAUSEA MEDICATION *UNUSUAL SHORTNESS OF BREATH *UNUSUAL BRUISING OR BLEEDING *URINARY PROBLEMS (pain or burning when urinating, or frequent urination) *BOWEL PROBLEMS (unusual diarrhea, constipation, pain near the anus) TENDERNESS IN MOUTH AND THROAT WITH OR WITHOUT PRESENCE OF ULCERS (sore throat, sores in mouth, or a toothache) UNUSUAL RASH, SWELLING OR PAIN  UNUSUAL VAGINAL DISCHARGE OR ITCHING   Items with * indicate a potential emergency and should be followed up as soon as possible or go to the Emergency Department if any problems should occur.  Please show the CHEMOTHERAPY ALERT CARD or IMMUNOTHERAPY ALERT CARD at check-in to  the Emergency Department and triage nurse.  Should you have questions after your visit or need to cancel or reschedule your appointment, please contact [facility]  [phone] and follow the prompts.  Office hours are 8:00 a.m. to 4:30 p.m. Monday - Friday. Please note that voicemails left after 4:00 p.m. may not be returned until the following business day.  We are closed weekends and major holidays. You have access to a nurse at all times for urgent questions. Please call the main number to the clinic [phone] and follow the prompts.  For any non-urgent questions, you may also contact your provider using MyChart. We now offer e-Visits for anyone 3 and older to request care online for non-urgent symptoms. For details visit [email or web address].   Also download the MyChart app! Go to the app store, search "MyChart", open the app, select [facility], and log in with your MyChart username and password.  Due to Covid, a mask is required upon entering the hospital/clinic. If you do not have a mask, one will be given to you upon arrival. For doctor visits, patients may have 1 support person aged 64 or older with them. For treatment visits, patients cannot have anyone with them due to current Covid guidelines and our immunocompromised population.

## 2020-10-08 ENCOUNTER — Inpatient Hospital Stay: Payer: Medicare Other

## 2020-10-08 VITALS — BP 115/68 | HR 88 | Temp 96.9°F | Resp 16

## 2020-10-08 DIAGNOSIS — C9310 Chronic myelomonocytic leukemia not having achieved remission: Secondary | ICD-10-CM

## 2020-10-08 DIAGNOSIS — Z79899 Other long term (current) drug therapy: Secondary | ICD-10-CM | POA: Diagnosis not present

## 2020-10-08 DIAGNOSIS — Z5111 Encounter for antineoplastic chemotherapy: Secondary | ICD-10-CM | POA: Diagnosis not present

## 2020-10-08 DIAGNOSIS — C9311 Chronic myelomonocytic leukemia, in remission: Secondary | ICD-10-CM | POA: Diagnosis not present

## 2020-10-08 MED ORDER — AZACITIDINE CHEMO SQ INJECTION
50.0000 mg/m2 | Freq: Once | INTRAMUSCULAR | Status: AC
  Administered 2020-10-08: 132.5 mg via SUBCUTANEOUS
  Filled 2020-10-08: qty 5.3

## 2020-10-08 MED ORDER — ONDANSETRON HCL 4 MG PO TABS
8.0000 mg | ORAL_TABLET | Freq: Once | ORAL | Status: AC
  Administered 2020-10-08: 8 mg via ORAL
  Filled 2020-10-08: qty 2

## 2020-10-08 NOTE — Patient Instructions (Signed)
[facility]COL[facility]  Discharge Instructions: Thank you for choosing [facility] to provide your oncology and hematology care.  If you have a lab appointment with the [facility], please go directly to the [facility] and check in at the registration area.  Wear comfortable clothing and clothing appropriate for easy access to any Portacath or PICC line.   We strive to give you quality time with your provider. You may need to reschedule your appointment if you arrive late (15 or more minutes).  Arriving late affects you and other patients whose appointments are after yours.  Also, if you miss three or more appointments without notifying the office, you may be dismissed from the clinic at the provider's discretion.      For prescription refill requests, have your pharmacy contact our office and allow 72 hours for refills to be completed.    Today you received the following chemotherapy and/or immunotherapy agents - Vidaza      To help prevent nausea and vomiting after your treatment, we encourage you to take your nausea medication as directed.  BELOW ARE SYMPTOMS THAT SHOULD BE REPORTED IMMEDIATELY: *FEVER GREATER THAN 100.4 F (38 C) OR HIGHER *CHILLS OR SWEATING *NAUSEA AND VOMITING THAT IS NOT CONTROLLED WITH YOUR NAUSEA MEDICATION *UNUSUAL SHORTNESS OF BREATH *UNUSUAL BRUISING OR BLEEDING *URINARY PROBLEMS (pain or burning when urinating, or frequent urination) *BOWEL PROBLEMS (unusual diarrhea, constipation, pain near the anus) TENDERNESS IN MOUTH AND THROAT WITH OR WITHOUT PRESENCE OF ULCERS (sore throat, sores in mouth, or a toothache) UNUSUAL RASH, SWELLING OR PAIN  UNUSUAL VAGINAL DISCHARGE OR ITCHING   Items with * indicate a potential emergency and should be followed up as soon as possible or go to the Emergency Department if any problems should occur.  Please show the CHEMOTHERAPY ALERT CARD or IMMUNOTHERAPY ALERT CARD at check-in to  the Emergency Department and triage nurse.  Should you have questions after your visit or need to cancel or reschedule your appointment, please contact [facility]  [phone] and follow the prompts.  Office hours are 8:00 a.m. to 4:30 p.m. Monday - Friday. Please note that voicemails left after 4:00 p.m. may not be returned until the following business day.  We are closed weekends and major holidays. You have access to a nurse at all times for urgent questions. Please call the main number to the clinic [phone] and follow the prompts.  For any non-urgent questions, you may also contact your provider using MyChart. We now offer e-Visits for anyone 29 and older to request care online for non-urgent symptoms. For details visit [email or web address].   Also download the MyChart app! Go to the app store, search "MyChart", open the app, select [facility], and log in with your MyChart username and password.  Due to Covid, a mask is required upon entering the hospital/clinic. If you do not have a mask, one will be given to you upon arrival. For doctor visits, patients may have 1 support person aged 55 or older with them. For treatment visits, patients cannot have anyone with them due to current Covid guidelines and our immunocompromised population.   Azacitidine suspension for injection (subcutaneous use) What is this medication? AZACITIDINE (ay [place]) is a chemotherapy drug. This medicine reduces the growth of cancer cells and can suppress the immune system. It is used fortreating myelodysplastic syndrome or some types of leukemia. This medicine may be used for other purposes; ask  your health care provider orpharmacist if you have questions. COMMON BRAND NAME(S): Vidaza What should I tell my care team before I take this medication? They need to know if you have any of these conditions: kidney disease liver disease liver tumors an unusual or allergic  reaction to azacitidine, mannitol, other medicines, foods, dyes, or preservatives pregnant or trying to get pregnant breast-feeding How should I use this medication? This medicine is for injection under the skin. It is administered in a hospitalor clinic by a specially trained health care professional. Talk to your pediatrician regarding the use of this medicine in children. Whilethis drug may be prescribed for selected conditions, precautions do apply. Overdosage: If you think you have taken too much of this medicine contact apoison control center or emergency room at once. NOTE: This medicine is only for you. Do not share this medicine with others. What if I miss a dose? It is important not to miss your dose. Call your doctor or health careprofessional if you are unable to keep an appointment. What may interact with this medication? Interactions have not been studied. Give your health care provider a list of all the medicines, herbs, non-prescription drugs, or dietary supplements you use. Also tell them if you smoke, drink alcohol, or use illegal drugs. Some items may interact with yourmedicine. This list may not describe all possible interactions. Give your health care provider a list of all the medicines, herbs, non-prescription drugs, or dietary supplements you use. Also tell them if you smoke, drink alcohol, or use illegaldrugs. Some items may interact with your medicine. What should I watch for while using this medication? Visit your doctor for checks on your progress. This drug may make you feel generally unwell. This is not uncommon, as chemotherapy can affect healthy cells as well as cancer cells. Report any side effects. Continue your course oftreatment even though you feel ill unless your doctor tells you to stop. In some cases, you may be given additional medicines to help with side effects.Follow all directions for their use. Call your doctor or health care professional for advice if  you get a fever, chills or sore throat, or other symptoms of a cold or flu. Do not treat yourself. This drug decreases your body's ability to fight infections. Try toavoid being around people who are sick. This medicine may increase your risk to bruise or bleed. Call your doctor orhealth care professional if you notice any unusual bleeding. You may need blood work done while you are taking this medicine. Do not become pregnant while taking this medicine and for 6 months after the last dose. Women should inform their doctor if they wish to become pregnant or think they might be pregnant. Men should not father a child while taking this medicine and for 3 months after the last dose. There is a potential for serious side effects to an unborn child. Talk to your health care professional or pharmacist for more information. Do not breast-feed an infant while taking thismedicine and for 1 week after the last dose. This medicine may interfere with the ability to have a child. Talk with yourdoctor or health care professional if you are concerned about your fertility. What side effects may I notice from receiving this medication? Side effects that you should report to your doctor or health care professionalas soon as possible: allergic reactions like skin rash, itching or hives, swelling of the face, lips, or tongue low blood counts - this medicine may decrease the number of white  blood cells, red blood cells and platelets. You may be at increased risk for infections and bleeding. signs of infection - fever or chills, cough, sore throat, pain passing urine signs of decreased platelets or bleeding - bruising, pinpoint red spots on the skin, black, tarry stools, blood in the urine signs of decreased red blood cells - unusually weak or tired, fainting spells, lightheadedness signs and symptoms of kidney injury like trouble passing urine or change in the amount of urine signs and symptoms of liver injury like dark  yellow or brown urine; general ill feeling or flu-like symptoms; light-colored stools; loss of appetite; nausea; right upper belly pain; unusually weak or tired; yellowing of the eyes or skin Side effects that usually do not require medical attention (report to yourdoctor or health care professional if they continue or are bothersome): constipation diarrhea nausea, vomiting pain or redness at the injection site unusually weak or tired This list may not describe all possible side effects. Call your doctor for medical advice about side effects. You may report side effects to FDA at1-800-FDA-1088. Where should I keep my medication? This drug is given in a hospital or clinic and will not be stored at home. NOTE: This sheet is a summary. It may not cover all possible information. If you have questions about this medicine, talk to your doctor, pharmacist, orhealth care provider.  2022 Elsevier/Gold Standard (2016-03-21 14:37:51)

## 2020-10-17 ENCOUNTER — Other Ambulatory Visit: Payer: Self-pay | Admitting: Oncology

## 2020-11-01 ENCOUNTER — Inpatient Hospital Stay: Payer: Medicare Other

## 2020-11-01 ENCOUNTER — Encounter: Payer: Self-pay | Admitting: Oncology

## 2020-11-01 ENCOUNTER — Other Ambulatory Visit: Payer: Self-pay

## 2020-11-01 ENCOUNTER — Telehealth: Payer: Self-pay | Admitting: *Deleted

## 2020-11-01 ENCOUNTER — Inpatient Hospital Stay (HOSPITAL_BASED_OUTPATIENT_CLINIC_OR_DEPARTMENT_OTHER): Payer: Medicare Other | Admitting: Oncology

## 2020-11-01 VITALS — BP 115/67 | HR 88 | Temp 97.9°F | Resp 17 | Wt 317.0 lb

## 2020-11-01 DIAGNOSIS — C9311 Chronic myelomonocytic leukemia, in remission: Secondary | ICD-10-CM | POA: Diagnosis not present

## 2020-11-01 DIAGNOSIS — Z5111 Encounter for antineoplastic chemotherapy: Secondary | ICD-10-CM | POA: Diagnosis not present

## 2020-11-01 DIAGNOSIS — C9310 Chronic myelomonocytic leukemia not having achieved remission: Secondary | ICD-10-CM | POA: Diagnosis not present

## 2020-11-01 DIAGNOSIS — R2241 Localized swelling, mass and lump, right lower limb: Secondary | ICD-10-CM | POA: Diagnosis not present

## 2020-11-01 DIAGNOSIS — Z79899 Other long term (current) drug therapy: Secondary | ICD-10-CM | POA: Diagnosis not present

## 2020-11-01 LAB — CBC WITH DIFFERENTIAL/PLATELET
Abs Immature Granulocytes: 0.5 10*3/uL — ABNORMAL HIGH (ref 0.00–0.07)
Basophils Absolute: 0.2 10*3/uL — ABNORMAL HIGH (ref 0.0–0.1)
Basophils Relative: 2 %
Eosinophils Absolute: 0.4 10*3/uL (ref 0.0–0.5)
Eosinophils Relative: 3 %
HCT: 46.4 % (ref 39.0–52.0)
Hemoglobin: 14.8 g/dL (ref 13.0–17.0)
Immature Granulocytes: 3 %
Lymphocytes Relative: 14 %
Lymphs Abs: 2.1 10*3/uL (ref 0.7–4.0)
MCH: 27.5 pg (ref 26.0–34.0)
MCHC: 31.9 g/dL (ref 30.0–36.0)
MCV: 86.1 fL (ref 80.0–100.0)
Monocytes Absolute: 1.4 10*3/uL — ABNORMAL HIGH (ref 0.1–1.0)
Monocytes Relative: 10 %
Neutro Abs: 10.1 10*3/uL — ABNORMAL HIGH (ref 1.7–7.7)
Neutrophils Relative %: 68 %
Platelets: 455 10*3/uL — ABNORMAL HIGH (ref 150–400)
RBC: 5.39 MIL/uL (ref 4.22–5.81)
RDW: 19.1 % — ABNORMAL HIGH (ref 11.5–15.5)
Smear Review: INCREASED
WBC: 14.9 10*3/uL — ABNORMAL HIGH (ref 4.0–10.5)
nRBC: 0.1 % (ref 0.0–0.2)

## 2020-11-01 LAB — COMPREHENSIVE METABOLIC PANEL
ALT: 14 U/L (ref 0–44)
AST: 16 U/L (ref 15–41)
Albumin: 4.2 g/dL (ref 3.5–5.0)
Alkaline Phosphatase: 81 U/L (ref 38–126)
Anion gap: 8 (ref 5–15)
BUN: 11 mg/dL (ref 8–23)
CO2: 27 mmol/L (ref 22–32)
Calcium: 8.8 mg/dL — ABNORMAL LOW (ref 8.9–10.3)
Chloride: 100 mmol/L (ref 98–111)
Creatinine, Ser: 0.77 mg/dL (ref 0.61–1.24)
GFR, Estimated: >60 mL/min (ref >60)
Glucose, Bld: 104 mg/dL — ABNORMAL HIGH (ref 70–99)
Potassium: 3.9 mmol/L (ref 3.5–5.1)
Sodium: 135 mmol/L (ref 135–145)
Total Bilirubin: 0.6 mg/dL (ref 0.3–1.2)
Total Protein: 7.7 g/dL (ref 6.5–8.1)

## 2020-11-01 MED ORDER — TRAZODONE HCL 50 MG PO TABS: 50.0000 mg | ORAL_TABLET | Freq: Every day | ORAL | 1 refills | Status: DC

## 2020-11-01 MED ORDER — AZACITIDINE CHEMO SQ INJECTION
50.0000 mg/m2 | Freq: Once | INTRAMUSCULAR | Status: AC
  Administered 2020-11-01: 132.5 mg via SUBCUTANEOUS
  Filled 2020-11-01: qty 5.3

## 2020-11-01 MED ORDER — ONDANSETRON HCL 4 MG PO TABS
8.0000 mg | ORAL_TABLET | Freq: Once | ORAL | Status: AC
  Administered 2020-11-01: 8 mg via ORAL
  Filled 2020-11-01: qty 2

## 2020-11-01 NOTE — Patient Instructions (Signed)
[facility]COL[facility]   Discharge Instructions: Thank you for choosing [facility] to provide your oncology and hematology care.  If you have a lab appointment with the [facility], please go directly to the [facility] and check in at the registration area.  We strive to give you quality time with your provider. You may need to reschedule your appointment if you arrive late (15 or more minutes).  Arriving late affects you and other patients whose appointments are after yours.  Also, if you miss three or more appointments without notifying the office, you may be dismissed from the clinic at the provider's discretion.      For prescription refill requests, have your pharmacy contact our office and allow 72 hours for refills to be completed.    Today you received the following chemotherapy and/or immunotherapy agents: Vidaza.      To help prevent nausea and vomiting after your treatment, we encourage you to take your nausea medication as directed.  BELOW ARE SYMPTOMS THAT SHOULD BE REPORTED IMMEDIATELY: *FEVER GREATER THAN 100.4 F (38 C) OR HIGHER *CHILLS OR SWEATING *NAUSEA AND VOMITING THAT IS NOT CONTROLLED WITH YOUR NAUSEA MEDICATION *UNUSUAL SHORTNESS OF BREATH *UNUSUAL BRUISING OR BLEEDING *URINARY PROBLEMS (pain or burning when urinating, or frequent urination) *BOWEL PROBLEMS (unusual diarrhea, constipation, pain near the anus) TENDERNESS IN MOUTH AND THROAT WITH OR WITHOUT PRESENCE OF ULCERS (sore throat, sores in mouth, or a toothache) UNUSUAL RASH, SWELLING OR PAIN  UNUSUAL VAGINAL DISCHARGE OR ITCHING   Items with * indicate a potential emergency and should be followed up as soon as possible or go to the Emergency Department if any problems should occur.  Please show the CHEMOTHERAPY ALERT CARD or IMMUNOTHERAPY ALERT CARD at check-in to the Emergency Department and triage nurse.  Should you have questions after your visit or need to  cancel or reschedule your appointment, please contact [facility]  [phone] and follow the prompts.  Office hours are 8:00 a.m. to 4:30 p.m. Monday - Friday. Please note that voicemails left after 4:00 p.m. may not be returned until the following business day.  We are closed weekends and major holidays. You have access to a nurse at all times for urgent questions. Please call the main number to the clinic [phone] and follow the prompts.  For any non-urgent questions, you may also contact your provider using MyChart. We now offer e-Visits for anyone 57 and older to request care online for non-urgent symptoms. For details visit [email or web address].   Also download the MyChart app! Go to the app store, search "MyChart", open the app, select [facility], and log in with your MyChart username and password.  Due to Covid, a mask is required upon entering the hospital/clinic. If you do not have a mask, one will be given to you upon arrival. For doctor visits, patients may have 1 support person aged 49 or older with them. For treatment visits, patients cannot have anyone with them due to current Covid guidelines and our immunocompromised population.

## 2020-11-01 NOTE — Progress Notes (Signed)
No new concerns today at oncologist appointment  

## 2020-11-01 NOTE — Telephone Encounter (Signed)
Called pt and left message about [facility] and they are no openings this week in [place] but there is one in [place] at [facility]. It is [address] same [address] of [place].  Appt 9/1 arrive 10:45 and it will take  45 min. He called later in the day and got instructions from [facility] and he will go there

## 2020-11-01 NOTE — Progress Notes (Signed)
Hematology/Oncology Consult note [facility]  Telephone:([phone] [phone]  Patient Care Team: Lavera Guise, MD as PCP - General (Internal Medicine) Sindy Guadeloupe, MD as Consulting Physician (Oncology) Edythe Clarity, [facility] as Pharmacist (Pharmacist)   Name of the patient: Samuel Frank  [id number]  October 02, 1940   Date of visit: 11/01/20  Diagnosis-CMML 1  Chief complaint/ Reason for visit-on treatment assessment prior to next cycle of Vidaza  Heme/Onc history: Patient is a 80 yr old male with no significant co morbidities other than hypertension.  He has recently been having bilateral knee pain and has undergone steroid injection in his knee.Patient was noted to have a high white count on his routine exam.  His white count was 19.2 on 07/22/2018 with an H&H of 11.4/35.1 and a platelet count of 470.  At that point he was prescribed a course of antibiotics and a repeat blood work on 08/21/2018 showed white count of 42.3, H&H of 7.1/23 with an MCV of 84 and a platelet count of 791 and hence the patient was referred to hematology for further management.     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  In addition the core biopsy showed a small focus of fibrosis containing scattering of mast cells and eosinophils most suggestive of systemic mastocytosis.bone marrow blasts 7%.  Flow cytometry showed increased number of monocytic cells representing 16% of all cells with expression for HLA-DR, CD11b, CD13, CD14, CD33, CD38, CD56 and CD56 and CD64 with [facility] CD 117 are CD34. normal cytogenetics, CBL, SRSF2, SH2B3 and TET2    Repeat bone marrow biopsy after 4 cycles of Vidaza showed less pronounced monocytic/granulocytic proliferation and overall better granulopoiesis and no increase in blasts.  In addition features of systemic mastocytosis not present.   After 9 cycles of Vidaza patient was noted to have  significant anemia and worsening thrombocytosis for which a repeat bone marrow biopsy was done.  Bone marrow did not show any features of progression of CMML.  Overall stable findings and no increased blast.He was found to have iron deficiency and received 2 doses of Feraheme.   Patient seen by [facility] clinic GI and underwent EGD and colonoscopy for iron deficiency anemia.Colonoscopy showed nonbleeding internal hemorrhoids.  Normal mucosa in the colon.  No polyps EGD showed normal esophagus and gastric mucosal atrophy.  No stigmata of bleeding    Interval history-patient reports doing well overall and denies any specific complaints at this time.He has had right lower extremity swelling in the past as well and appears more pronounced today.  Denies any shortness of breath  ECOG PS- 1 Pain scale- 0   Review of systems- Review of Systems  Constitutional:  Negative for chills, fever, malaise/fatigue and weight loss.  HENT:  Negative for congestion, ear discharge and nosebleeds.   Eyes:  Negative for blurred vision.  Respiratory:  Negative for cough, hemoptysis, sputum production, shortness of breath and wheezing.   Cardiovascular:  Positive for leg swelling. Negative for chest pain, palpitations, orthopnea and claudication.  Gastrointestinal:  Negative for abdominal pain, blood in stool, constipation, diarrhea, heartburn, melena, nausea and vomiting.  Genitourinary:  Negative for dysuria, flank pain, frequency, hematuria and urgency.  Musculoskeletal:  Negative for back pain, joint pain and myalgias.  Skin:  Negative for rash.  Neurological:  Negative for dizziness, tingling, focal weakness, seizures, weakness and headaches.  Endo/Heme/Allergies:  Does not bruise/bleed easily.  Psychiatric/Behavioral:  Negative for depression  and suicidal ideas. The patient does not have insomnia.      No Known Allergies   Past Medical History:  Diagnosis Date   Anemia    CMML (chronic myelomonocytic leukemia)  ([place])    CMML (chronic myelomonocytic leukemia) ([place])    Dyspnea    Hypertension      Past Surgical History:  Procedure Laterality Date   COLONOSCOPY N/A 08/28/2019   Procedure: COLONOSCOPY;  Surgeon: Toledo, Benay Pike, MD;  Location: [facility] ENDOSCOPY;  Service: Gastroenterology;  Laterality: N/A;   COLONOSCOPY WITH PROPOFOL N/A 05/03/2015   Procedure: COLONOSCOPY WITH PROPOFOL;  Surgeon: [name], MD;  Location: [facility] ENDOSCOPY;  Service: Gastroenterology;  Laterality: N/A;   ESOPHAGOGASTRODUODENOSCOPY N/A 08/28/2019   Procedure: ESOPHAGOGASTRODUODENOSCOPY (EGD);  Surgeon: Toledo, Benay Pike, MD;  Location: [facility] ENDOSCOPY;  Service: Gastroenterology;  Laterality: N/A;   ESOPHAGOGASTRODUODENOSCOPY (EGD) WITH PROPOFOL N/A 05/03/2015   Procedure: ESOPHAGOGASTRODUODENOSCOPY (EGD) WITH PROPOFOL;  Surgeon: [name], MD;  Location: [facility] ENDOSCOPY;  Service: Gastroenterology;  Laterality: N/A;   HERNIA REPAIR     1975    Social History   Socioeconomic History   Marital status: Divorced    Spouse name: Not on file   Number of children: 4   Years of education: Not on file   Highest education level: Not on file  Occupational History   Occupation: retired    Comment: [employer]  Tobacco Use   Smoking status: Never   Smokeless tobacco: Never  Vaping Use   Vaping Use: Never used  Substance and Sexual Activity   Alcohol use: No   Drug use: No   Sexual activity: Not Currently  Other Topics Concern   Not on file  Social History Narrative   Not on file   Social Determinants of Health   Financial Resource Strain: Low Risk    Difficulty of Paying Living Expenses: Not very hard  Food Insecurity: Not on file  Transportation Needs: Not on file  Physical Activity: Not on file  Stress: Not on file  Social Connections: Not on file  Intimate Partner Violence: Not on file    Family History  Problem Relation Age of Onset   Alzheimer's disease Father    Lung cancer Sister     Cancer Sister      Current Outpatient Medications:    allopurinol (ZYLOPRIM) 300 MG tablet, TAKE 1 TABLET BY MOUTH EVERY DAY, Disp: 90 tablet, Rfl: 1   aspirin EC 81 MG tablet, Take 81 mg by mouth daily., Disp: , Rfl:    furosemide (LASIX) 20 MG tablet, Take 1 tablet (20 mg total) by mouth daily., Disp: 5 tablet, Rfl: 0   hydrochlorothiazide (MICROZIDE) 12.5 MG capsule, TAKE 1 CAPSULE BY MOUTH EVERY DAY, Disp: 30 capsule, Rfl: 5   pantoprazole (PROTONIX) 40 MG tablet, Take 1 tablet (40 mg total) by mouth daily., Disp: 90 tablet, Rfl: 0   potassium chloride SA (KLOR-CON M20) 20 MEQ tablet, Take 1 tablet (20 mEq total) by mouth 2 (two) times daily., Disp: 180 tablet, Rfl: 1   Tdap (BOOSTRIX) 5-2.5-18.5 LF-MCG/0.5 injection, Inject into the muscle once., Disp: , Rfl:    triamcinolone ointment (KENALOG) 0.5 %, APPLY 1 APPLICATION TOPICALLY 2 TIMES DAILY. TO THE AFFECTED AREAS OF FEET AND LEFT SIDE OF STOMACH, Disp: 30 g, Rfl: 0   valACYclovir (VALTREX) 500 MG tablet, TAKE 1 TABLET BY MOUTH EVERY DAY, Disp: 90 tablet, Rfl: 1   Zoster Vaccine Adjuvanted (SHINGRIX) injection, Inject 0.5  mLs into the muscle once., Disp: , Rfl:    traZODone (DESYREL) 50 MG tablet, Take 1 tablet (50 mg total) by mouth at bedtime., Disp: 30 tablet, Rfl: 1  Physical exam:  Vitals:   11/01/20 0951  BP: 115/67  Pulse: 88  Resp: 17  Temp: 97.9 F (36.6 C)  TempSrc: Tympanic  SpO2: 100%  Weight: (!) 317 lb (143.8 kg)   Physical Exam Cardiovascular:     Rate and Rhythm: Normal rate and regular rhythm.     Heart sounds: Normal heart sounds.  Pulmonary:     Effort: Pulmonary effort is normal.     Breath sounds: Normal breath sounds.  Abdominal:     General: Bowel sounds are normal.     Palpations: Abdomen is soft.  Musculoskeletal:     Comments: Bilateral leg edema right greater than left  Skin:    General: Skin is warm and dry.  Neurological:     Mental Status: He is alert and oriented to person, place, and  time.     CMP Latest Ref Rng & Units 11/01/2020  Glucose 70 - 99 mg/dL 104(H)  BUN 8 - 23 mg/dL 11  Creatinine 0.61 - 1.24 mg/dL 0.77  Sodium 135 - 145 mmol/L 135  Potassium 3.5 - 5.1 mmol/L 3.9  Chloride 98 - 111 mmol/L 100  CO2 22 - 32 mmol/L 27  Calcium 8.9 - 10.3 mg/dL 8.8(L)  Total Protein 6.5 - 8.1 g/dL 7.7  Total Bilirubin 0.3 - 1.2 mg/dL 0.6  Alkaline Phos 38 - 126 U/L 81  AST 15 - 41 U/L 16  ALT 0 - 44 U/L 14   CBC Latest Ref Rng & Units 11/01/2020  WBC 4.0 - 10.5 K/uL 14.9(H)  Hemoglobin 13.0 - 17.0 g/dL 14.8  Hematocrit 39.0 - 52.0 % 46.4  Platelets 150 - 400 K/uL 455(H)     Assessment and plan- Patient is a 80 y.o. male with history of CMML 1 here for on treatment assessment prior to next cycle of Vidaza  Counts okay to proceed with Vidaza today day 1 to day 5 and I will see him back in 4 weeks for cycle 27.  Plan is to continue Vidaza until progression or toxicity  Right lower extremity edema: He has had this in the past and he has waxing and waning bilateral lower extremity edema right greater than left.  Doppler in April 2021 did not show any evidence of DVT.  Will repeat one again at this time   Visit Diagnosis 1. 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 Encounter for antineoplastic chemotherapy   3. Localized swelling of right lower extremity      Dr. Randa Evens, MD, MPH [facility] at [facility] [phone] 11/01/2020 1:09 PM

## 2020-11-02 ENCOUNTER — Encounter: Payer: Self-pay | Admitting: Oncology

## 2020-11-02 ENCOUNTER — Inpatient Hospital Stay: Payer: Medicare Other

## 2020-11-02 VITALS — BP 113/65 | HR 92 | Temp 96.0°F | Resp 17

## 2020-11-02 DIAGNOSIS — Z79899 Other long term (current) drug therapy: Secondary | ICD-10-CM | POA: Diagnosis not present

## 2020-11-02 DIAGNOSIS — C9311 Chronic myelomonocytic leukemia, in remission: Secondary | ICD-10-CM | POA: Diagnosis not present

## 2020-11-02 DIAGNOSIS — C9310 Chronic myelomonocytic leukemia not having achieved remission: Secondary | ICD-10-CM

## 2020-11-02 DIAGNOSIS — Z5111 Encounter for antineoplastic chemotherapy: Secondary | ICD-10-CM | POA: Diagnosis not present

## 2020-11-02 MED ORDER — AZACITIDINE CHEMO SQ INJECTION
50.0000 mg/m2 | Freq: Once | INTRAMUSCULAR | Status: AC
  Administered 2020-11-02: 132.5 mg via SUBCUTANEOUS
  Filled 2020-11-02: qty 5.3

## 2020-11-02 MED ORDER — ONDANSETRON HCL 4 MG PO TABS
8.0000 mg | ORAL_TABLET | Freq: Once | ORAL | Status: AC
  Administered 2020-11-02: 8 mg via ORAL
  Filled 2020-11-02: qty 2

## 2020-11-02 NOTE — Patient Instructions (Signed)
[facility] ONCOLOGY  Discharge Instructions: Thank you for choosing [facility] to provide your oncology and hematology care.  If you have a lab appointment with the [facility], please go directly to the [facility] and check in at the registration area.  Wear comfortable clothing and clothing appropriate for easy access to any Portacath or PICC line.   We strive to give you quality time with your provider. You may need to reschedule your appointment if you arrive late (15 or more minutes).  Arriving late affects you and other patients whose appointments are after yours.  Also, if you miss three or more appointments without notifying the office, you may be dismissed from the clinic at the provider's discretion.      For prescription refill requests, have your pharmacy contact our office and allow 72 hours for refills to be completed.    Today you received the following chemotherapy and/or immunotherapy agents Vidaza       To help prevent nausea and vomiting after your treatment, we encourage you to take your nausea medication as directed.  BELOW ARE SYMPTOMS THAT SHOULD BE REPORTED IMMEDIATELY: *FEVER GREATER THAN 100.4 F (38 C) OR HIGHER *CHILLS OR SWEATING *NAUSEA AND VOMITING THAT IS NOT CONTROLLED WITH YOUR NAUSEA MEDICATION *UNUSUAL SHORTNESS OF BREATH *UNUSUAL BRUISING OR BLEEDING *URINARY PROBLEMS (pain or burning when urinating, or frequent urination) *BOWEL PROBLEMS (unusual diarrhea, constipation, pain near the anus) TENDERNESS IN MOUTH AND THROAT WITH OR WITHOUT PRESENCE OF ULCERS (sore throat, sores in mouth, or a toothache) UNUSUAL RASH, SWELLING OR PAIN  UNUSUAL VAGINAL DISCHARGE OR ITCHING   Items with * indicate a potential emergency and should be followed up as soon as possible or go to the Emergency Department if any problems should occur.  Please show the CHEMOTHERAPY ALERT CARD or IMMUNOTHERAPY ALERT CARD at check-in to  the Emergency Department and triage nurse.  Should you have questions after your visit or need to cancel or reschedule your appointment, please contact [facility]  [phone] and follow the prompts.  Office hours are 8:00 a.m. to 4:30 p.m. Monday - Friday. Please note that voicemails left after 4:00 p.m. may not be returned until the following business day.  We are closed weekends and major holidays. You have access to a nurse at all times for urgent questions. Please call the main number to the clinic [phone] and follow the prompts.  For any non-urgent questions, you may also contact your provider using MyChart. We now offer e-Visits for anyone 68 and older to request care online for non-urgent symptoms. For details visit [email or web address].   Also download the MyChart app! Go to the app store, search "MyChart", open the app, select [facility], and log in with your MyChart username and password.  Due to Covid, a mask is required upon entering the hospital/clinic. If you do not have a mask, one will be given to you upon arrival. For doctor visits, patients may have 1 support person aged 72 or older with them. For treatment visits, patients cannot have anyone with them due to current Covid guidelines and our immunocompromised population.

## 2020-11-03 ENCOUNTER — Inpatient Hospital Stay: Payer: Medicare Other

## 2020-11-03 VITALS — BP 131/77 | HR 101 | Temp 97.3°F | Resp 16

## 2020-11-03 DIAGNOSIS — Z5111 Encounter for antineoplastic chemotherapy: Secondary | ICD-10-CM | POA: Diagnosis not present

## 2020-11-03 DIAGNOSIS — C9310 Chronic myelomonocytic leukemia not having achieved remission: Secondary | ICD-10-CM

## 2020-11-03 DIAGNOSIS — Z79899 Other long term (current) drug therapy: Secondary | ICD-10-CM | POA: Diagnosis not present

## 2020-11-03 DIAGNOSIS — C9311 Chronic myelomonocytic leukemia, in remission: Secondary | ICD-10-CM | POA: Diagnosis not present

## 2020-11-03 MED ORDER — ONDANSETRON HCL 4 MG PO TABS
8.0000 mg | ORAL_TABLET | Freq: Once | ORAL | Status: AC
  Administered 2020-11-03: 8 mg via ORAL
  Filled 2020-11-03: qty 2

## 2020-11-03 MED ORDER — AZACITIDINE CHEMO SQ INJECTION
50.0000 mg/m2 | Freq: Once | INTRAMUSCULAR | Status: AC
  Administered 2020-11-03: 132.5 mg via SUBCUTANEOUS
  Filled 2020-11-03: qty 5.3

## 2020-11-03 NOTE — Patient Instructions (Signed)
[facility] ONCOLOGY  Discharge Instructions: Thank you for choosing [facility] to provide your oncology and hematology care.  If you have a lab appointment with the [facility], please go directly to the [facility] and check in at the registration area.  Wear comfortable clothing and clothing appropriate for easy access to any Portacath or PICC line.   We strive to give you quality time with your provider. You may need to reschedule your appointment if you arrive late (15 or more minutes).  Arriving late affects you and other patients whose appointments are after yours.  Also, if you miss three or more appointments without notifying the office, you may be dismissed from the clinic at the provider's discretion.      For prescription refill requests, have your pharmacy contact our office and allow 72 hours for refills to be completed.    Today you received the following chemotherapy and/or immunotherapy agents vidaza   To help prevent nausea and vomiting after your treatment, we encourage you to take your nausea medication as directed.  BELOW ARE SYMPTOMS THAT SHOULD BE REPORTED IMMEDIATELY: *FEVER GREATER THAN 100.4 F (38 C) OR HIGHER *CHILLS OR SWEATING *NAUSEA AND VOMITING THAT IS NOT CONTROLLED WITH YOUR NAUSEA MEDICATION *UNUSUAL SHORTNESS OF BREATH *UNUSUAL BRUISING OR BLEEDING *URINARY PROBLEMS (pain or burning when urinating, or frequent urination) *BOWEL PROBLEMS (unusual diarrhea, constipation, pain near the anus) TENDERNESS IN MOUTH AND THROAT WITH OR WITHOUT PRESENCE OF ULCERS (sore throat, sores in mouth, or a toothache) UNUSUAL RASH, SWELLING OR PAIN  UNUSUAL VAGINAL DISCHARGE OR ITCHING   Items with * indicate a potential emergency and should be followed up as soon as possible or go to the Emergency Department if any problems should occur.  Please show the CHEMOTHERAPY ALERT CARD or IMMUNOTHERAPY ALERT CARD at check-in to the  Emergency Department and triage nurse.  Should you have questions after your visit or need to cancel or reschedule your appointment, please contact [facility]  [phone] and follow the prompts.  Office hours are 8:00 a.m. to 4:30 p.m. Monday - Friday. Please note that voicemails left after 4:00 p.m. may not be returned until the following business day.  We are closed weekends and major holidays. You have access to a nurse at all times for urgent questions. Please call the main number to the clinic [phone] and follow the prompts.  For any non-urgent questions, you may also contact your provider using MyChart. We now offer e-Visits for anyone 25 and older to request care online for non-urgent symptoms. For details visit [email or web address].   Also download the MyChart app! Go to the app store, search "MyChart", open the app, select [facility], and log in with your MyChart username and password.  Due to Covid, a mask is required upon entering the hospital/clinic. If you do not have a mask, one will be given to you upon arrival. For doctor visits, patients may have 1 support person aged 44 or older with them. For treatment visits, patients cannot have anyone with them due to current Covid guidelines and our immunocompromised population.

## 2020-11-04 ENCOUNTER — Ambulatory Visit
Admission: RE | Admit: 2020-11-04 | Discharge: 2020-11-04 | Disposition: A | Discharge status: Home / Self Care | Payer: Medicare Other | Source: Ambulatory Visit | Attending: Oncology | Admitting: Oncology

## 2020-11-04 ENCOUNTER — Other Ambulatory Visit: Payer: Self-pay

## 2020-11-04 ENCOUNTER — Inpatient Hospital Stay: Payer: Medicare Other | Attending: Oncology

## 2020-11-04 VITALS — BP 132/68 | HR 98 | Temp 97.4°F | Resp 18

## 2020-11-04 DIAGNOSIS — C9310 Chronic myelomonocytic leukemia not having achieved remission: Secondary | ICD-10-CM | POA: Insufficient documentation

## 2020-11-04 DIAGNOSIS — Z79899 Other long term (current) drug therapy: Secondary | ICD-10-CM | POA: Insufficient documentation

## 2020-11-04 DIAGNOSIS — Z5111 Encounter for antineoplastic chemotherapy: Secondary | ICD-10-CM | POA: Insufficient documentation

## 2020-11-04 DIAGNOSIS — C9311 Chronic myelomonocytic leukemia, in remission: Secondary | ICD-10-CM | POA: Diagnosis not present

## 2020-11-04 DIAGNOSIS — R600 Localized edema: Secondary | ICD-10-CM | POA: Diagnosis not present

## 2020-11-04 MED ORDER — AZACITIDINE CHEMO SQ INJECTION
50.0000 mg/m2 | Freq: Once | INTRAMUSCULAR | Status: AC
  Administered 2020-11-04: 132.5 mg via SUBCUTANEOUS
  Filled 2020-11-04: qty 5.3

## 2020-11-04 MED ORDER — ONDANSETRON HCL 4 MG PO TABS
8.0000 mg | ORAL_TABLET | Freq: Once | ORAL | Status: AC
  Administered 2020-11-04: 8 mg via ORAL
  Filled 2020-11-04: qty 2

## 2020-11-04 NOTE — Patient Instructions (Signed)
[facility]  Discharge Instructions: Thank you for choosing [facility] to provide your oncology and hematology care.  If you have a lab appointment with the [facility], please go directly to the [facility] and check in at the registration area.  Wear comfortable clothing and clothing appropriate for easy access to any Portacath or PICC line.   We strive to give you quality time with your provider. You may need to reschedule your appointment if you arrive late (15 or more minutes).  Arriving late affects you and other patients whose appointments are after yours.  Also, if you miss three or more appointments without notifying the office, you may be dismissed from the clinic at the provider's discretion.      For prescription refill requests, have your pharmacy contact our office and allow 72 hours for refills to be completed.    Today you received the following chemotherapy and/or immunotherapy agents Vidaza      To help prevent nausea and vomiting after your treatment, we encourage you to take your nausea medication as directed.  BELOW ARE SYMPTOMS THAT SHOULD BE REPORTED IMMEDIATELY: *FEVER GREATER THAN 100.4 F (38 C) OR HIGHER *CHILLS OR SWEATING *NAUSEA AND VOMITING THAT IS NOT CONTROLLED WITH YOUR NAUSEA MEDICATION *UNUSUAL SHORTNESS OF BREATH *UNUSUAL BRUISING OR BLEEDING *URINARY PROBLEMS (pain or burning when urinating, or frequent urination) *BOWEL PROBLEMS (unusual diarrhea, constipation, pain near the anus) TENDERNESS IN MOUTH AND THROAT WITH OR WITHOUT PRESENCE OF ULCERS (sore throat, sores in mouth, or a toothache) UNUSUAL RASH, SWELLING OR PAIN  UNUSUAL VAGINAL DISCHARGE OR ITCHING   Items with * indicate a potential emergency and should be followed up as soon as possible or go to the Emergency Department if any problems should occur.  Please show the CHEMOTHERAPY ALERT CARD or IMMUNOTHERAPY ALERT CARD at check-in to  the Emergency Department and triage nurse.  Should you have questions after your visit or need to cancel or reschedule your appointment, please contact [facility]  [phone] and follow the prompts.  Office hours are 8:00 a.m. to 4:30 p.m. Monday - Friday. Please note that voicemails left after 4:00 p.m. may not be returned until the following business day.  We are closed weekends and major holidays. You have access to a nurse at all times for urgent questions. Please call the main number to the clinic [phone] and follow the prompts.  For any non-urgent questions, you may also contact your provider using MyChart. We now offer e-Visits for anyone 3 and older to request care online for non-urgent symptoms. For details visit [email or web address].   Also download the MyChart app! Go to the app store, search "MyChart", open the app, select [facility], and log in with your MyChart username and password.  Due to Covid, a mask is required upon entering the hospital/clinic. If you do not have a mask, one will be given to you upon arrival. For doctor visits, patients may have 1 support person aged 64 or older with them. For treatment visits, patients cannot have anyone with them due to current Covid guidelines and our immunocompromised population.

## 2020-11-05 ENCOUNTER — Inpatient Hospital Stay: Payer: Medicare Other

## 2020-11-05 VITALS — BP 138/69 | HR 90 | Temp 96.8°F | Resp 16

## 2020-11-05 DIAGNOSIS — C9310 Chronic myelomonocytic leukemia not having achieved remission: Secondary | ICD-10-CM

## 2020-11-05 DIAGNOSIS — Z5111 Encounter for antineoplastic chemotherapy: Secondary | ICD-10-CM | POA: Diagnosis not present

## 2020-11-05 DIAGNOSIS — Z79899 Other long term (current) drug therapy: Secondary | ICD-10-CM | POA: Diagnosis not present

## 2020-11-05 DIAGNOSIS — C9311 Chronic myelomonocytic leukemia, in remission: Secondary | ICD-10-CM | POA: Diagnosis not present

## 2020-11-05 MED ORDER — AZACITIDINE CHEMO SQ INJECTION
50.0000 mg/m2 | Freq: Once | INTRAMUSCULAR | Status: AC
  Administered 2020-11-05: 132.5 mg via SUBCUTANEOUS
  Filled 2020-11-05: qty 5.3

## 2020-11-05 MED ORDER — ONDANSETRON HCL 4 MG PO TABS
8.0000 mg | ORAL_TABLET | Freq: Once | ORAL | Status: AC
  Administered 2020-11-05: 8 mg via ORAL
  Filled 2020-11-05: qty 2

## 2020-11-22 DIAGNOSIS — D509 Iron deficiency anemia, unspecified: Secondary | ICD-10-CM | POA: Diagnosis not present

## 2020-11-22 DIAGNOSIS — I700 Atherosclerosis of aorta: Secondary | ICD-10-CM | POA: Diagnosis not present

## 2020-11-22 DIAGNOSIS — D520 Dietary folate deficiency anemia: Secondary | ICD-10-CM | POA: Diagnosis not present

## 2020-11-22 DIAGNOSIS — E559 Vitamin D deficiency, unspecified: Secondary | ICD-10-CM | POA: Diagnosis not present

## 2020-11-22 DIAGNOSIS — R7303 Prediabetes: Secondary | ICD-10-CM | POA: Diagnosis not present

## 2020-11-22 DIAGNOSIS — D510 Vitamin B12 deficiency anemia due to intrinsic factor deficiency: Secondary | ICD-10-CM | POA: Diagnosis not present

## 2020-11-22 DIAGNOSIS — D513 Other dietary vitamin B12 deficiency anemia: Secondary | ICD-10-CM | POA: Diagnosis not present

## 2020-11-23 ENCOUNTER — Telehealth: Payer: Self-pay

## 2020-11-23 LAB — LIPID PANEL
Chol/HDL Ratio: 4.8 ratio (ref 0.0–5.0)
Cholesterol, Total: 157 mg/dL (ref 100–199)
HDL: 33 mg/dL — ABNORMAL LOW (ref >39)
LDL Chol Calc (NIH): 101 mg/dL — ABNORMAL HIGH (ref 0–99)
Triglycerides: 129 mg/dL (ref 0–149)
VLDL Cholesterol Cal: 23 mg/dL (ref 5–40)

## 2020-11-23 LAB — B12 AND FOLATE PANEL
Folate: 8 ng/mL (ref >3.0)
Vitamin B-12: 806 pg/mL (ref 232–1245)

## 2020-11-23 LAB — TSH+FREE T4
Free T4: 1.18 ng/dL (ref 0.82–1.77)
TSH: 0.892 u[IU]/mL (ref 0.450–4.500)

## 2020-11-23 LAB — VITAMIN D 25 HYDROXY (VIT D DEFICIENCY, FRACTURES): Vit D, 25-Hydroxy: 18.5 ng/mL — ABNORMAL LOW (ref 30.0–100.0)

## 2020-11-25 NOTE — Patient Instructions (Addendum)
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   Following your treatment plan will help keep the disease from getting worse.  Medicine is the most important piece of your plan.  There are many reasons why you might want to stop taking medicine.  You may get tired of taking your medicine.   You may think medicine costs too much money.   You may find the side effects are too much to bear.  Try some of these steps to make following the treatment plan a little easier.     Notes:        Patient Care Plan: General Pharmacy (Adult)     Problem Identified: HTN, GERD, Obesity, Chronic Leukemia   Priority: High  Onset Date: 07/20/2020     Long-Range Goal: Patient-Specific Goal   Start Date: 07/20/2020  Expected End Date: 01/20/2021  Recent Progress: On track  Priority: High  Note:   Current Barriers:  Bilateral swelling in both feet.  Pharmacist Clinical Goal(s):  Patient will achieve adherence to monitoring guidelines and medication adherence to achieve therapeutic efficacy maintain control of blood pressure as evidenced by home monitoring  adhere to prescribed medication regimen as evidenced by fill dates contact provider office for questions/concerns as evidenced notation of same in electronic health record through collaboration with PharmD and provider.   Interventions: 1:1 collaboration with Lavera Guise, MD regarding development and update of comprehensive plan of care as  evidenced by provider attestation and co-signature Inter-disciplinary care team collaboration (see longitudinal plan of care) Comprehensive medication review performed; medication list updated in electronic medical record  Hypertension (BP goal <140/90) -Controlled -Current treatment: HCTZ 12.5mg  daily Furosemide 20mg  daily -Medications previously tried: Valsartan/HCTZ  -Current home readings: "good" no logs present today, reports he checks periodically and it usually runs about what it does in office.  Last office Bps have been excellent -Current exercise habits: mows yards occasionally, not much outside of that -Denies hypotensive/hypertensive symptoms -Educated on BP goals and benefits of medications for prevention of heart attack, stroke and kidney damage; Importance of home blood pressure monitoring; Symptoms of hypotension and importance of maintaining adequate hydration; -Counseled to monitor BP at home a few time per week, document, and provide log at future appointments -Does have some bilateral swelling in both feet, noticed this about a week ago.  It is not red or painful or warm to the touch. -Was given some Lasix by oncology nurse to take for a short period of time but he has not picked up yet from pharmacy. -Recommended to continue current medication Counseled on elevating feet whenever possible to alleviate swelling. Recommended he pick  up Rx from [facility] for Lasix and take a dose in the morning to see if this helps.  Counseled to only take this medication on prn basis.  Update 11/26/20 Taking is lasix now, swelling has improved although he still has it sometimes. No logs for BP but reports it has been good. [place] of lower extremity did not show any evidence of DVT, discussed these results with patient.  No changes continue meds for now.  GERD (Goal: Minimize symptoms) -Controlled -Current treatment  Pantoprazole 40mg  daily -Medications previously tried: none  noted -Verbalizes correct medication administration -Denies symptoms unless he forgets a dose -Recommended to continue current medication  Update 11/26/20 GERD symptoms remain controlled No changes needed at this time Continue meds  Leukemia (Goal: Remission) -Controlled -Current treatment  Chemotherapy  -Medications previously tried: none noted -Patient receiving treatments 5 consecutive days per month -He reports he has never had any symptoms from the cancer or has not had any effect from the chemotherapy up to this point.  -Recommended continue current management strategy  Patient Goals/Self-Care Activities Patient will:  - take medications as prescribed check blood pressure a few times per week, document, and provide at future appointments Start Lasix on prn basis for swelling.  Follow Up Plan: The care management team will reach out to the patient again over the next 120 days.           Patient verbalizes understanding of instructions provided today and agrees to view in [facility].  Telephone follow up appointment with pharmacy team member scheduled for:  Edythe Clarity, [place]

## 2020-11-25 NOTE — Progress Notes (Signed)
Chronic Care Management Pharmacy Note  11/26/2020 Name:  Samuel Frank. MRN:  [id number] DOB:  06/01/1940  Subjective: Samuel Deery. is an 80 y.o. year old male who is a primary patient of Humphrey Rolls, Timoteo Gaul, MD.  The CCM team was consulted for assistance with disease management and care coordination needs.    Engaged with patient by telephone for initial visit in response to provider referral for pharmacy case management and/or care coordination services.   Consent to Services:  The patient was given the following information about Chronic Care Management services today, agreed to services, and gave verbal consent: 1. CCM service includes personalized support from designated clinical staff supervised by the primary care provider, including individualized plan of care and coordination with other care providers 2. 24/7 contact phone numbers for assistance for urgent and routine care needs. 3. Service will only be billed when office clinical staff spend 20 minutes or more in a month to coordinate care. 4. Only one practitioner may furnish and bill the service in a calendar month. 5.The patient may stop CCM services at any time (effective at the end of the month) by phone call to the office staff. 6. The patient will be responsible for cost sharing (co-pay) of up to 20% of the service fee (after annual deductible is met). Patient agreed to services and consent obtained.  Patient Care Team: Lavera Guise, MD as PCP - General (Internal Medicine) Sindy Guadeloupe, MD as Consulting Physician (Oncology) Edythe Clarity, [facility] as Pharmacist (Pharmacist)  Recent office visits: 04/01/20 Luiz Ochoa, NP. For follow-Up. No medication changes.  Recent consult visits: 11/04/20 US Venous Lower right leg - no evidence of DVT noted at this time 07/12/20 Oncology Sindy Guadeloupe, MD. For antineoplastic chemotherapy. Per note: Infusion treatment.STARTED Trazdone HCL 50 mg daily at bedtime and Triamcinolone  Acetonide 5.6% 1 application Topical 2 times daily, To the affected areas of feet and left side of stomach 06/14/20 Oncology Sindy Guadeloupe, MD. For antineoplastic chemotherapy. CHANGED Potassium Chloride Crys ER to 20 meq 2 times daily. Per note: Infusion treatment.  05/17/20 Oncology Sindy Guadeloupe, MD. For antineoplastic chemotherapy. Per note: Infusion treatment. STOPPED Cyanocobalamin. 04/12/20 Oncology Sindy Guadeloupe, MD. For antineoplastic chemotherapy. Per note: Infusion treatment.  03/15/20 Oncology Sindy Guadeloupe, MD. For Follow-Up. Per note Iron deficiency anemia: Ferritin levels came back low at 12.  He will receive 1 dose of Feraheme this week and second dose next week. 02/26/20 Orthopedic Surgey Reche Dixon, PA and Langston Reusing, MD . For pain in both knees. No medication changes.  02/16/20 Oncology Sindy Guadeloupe, MD. For antineoplastic chemotherapy. Per note: Infusion treatment. No medication changes.  01/19/20 Oncology Sindy Guadeloupe, MD. For antineoplastic chemotherapy. Per note: Infusion treatment. No medication changes.     Hospital visits: None in previous 6 months  Objective:  Lab Results  Component Value Date   CREATININE 0.77 11/01/2020   BUN 11 11/01/2020   GFRNONAA >60 11/01/2020   GFRAA >60 11/24/2019   NA 135 11/01/2020   K 3.9 11/01/2020   CALCIUM 8.8 (L) 11/01/2020   CO2 27 11/01/2020   GLUCOSE 104 (H) 11/01/2020    Lab Results  Component Value Date/Time   HGBA1C 5.6 10/01/2020 10:21 AM   HGBA1C 6.4 (H) 07/22/2018 08:11 AM    Last diabetic Eye exam: No results found for: HMDIABEYEEXA  Last diabetic Foot exam: No results found for: HMDIABFOOTEX   Lab Results  Component Value Date   CHOL 157 11/22/2020   HDL 33 (L) 11/22/2020   LDLCALC 101 (H) 11/22/2020   TRIG 129 11/22/2020   CHOLHDL 4.8 11/22/2020    Hepatic Function Latest Ref Rng & Units 11/01/2020 10/04/2020 09/07/2020  Total Protein 6.5 - 8.1 g/dL 7.7 7.2 7.5  Albumin 3.5 - 5.0 g/dL 4.2  4.0 4.0  AST 15 - 41 U/L '16 16 18  ' ALT 0 - 44 U/L '14 15 12  ' Alk Phosphatase 38 - 126 U/L 81 74 74  Total Bilirubin 0.3 - 1.2 mg/dL 0.6 0.6 0.9  Bilirubin, Direct 0.00 - 0.40 mg/dL - - -    Lab Results  Component Value Date/Time   TSH 0.892 11/22/2020 10:47 AM   TSH 0.980 08/05/2019 01:55 PM   TSH 0.658 09/03/2018 02:17 PM   TSH 1.420 07/22/2018 08:11 AM   FREET4 1.18 11/22/2020 10:47 AM   FREET4 0.88 08/05/2019 01:55 PM    CBC Latest Ref Rng & Units 11/01/2020 10/04/2020 09/07/2020  WBC 4.0 - 10.5 K/uL 14.9(H) 19.0(H) 13.8(H)  Hemoglobin 13.0 - 17.0 g/dL 14.8 14.5 14.4  Hematocrit 39.0 - 52.0 % 46.4 45.8 45.9  Platelets 150 - 400 K/uL 455(H) 270 309    Lab Results  Component Value Date/Time   VD25OH 18.5 (L) 11/22/2020 10:47 AM    Clinical ASCVD: No  The ASCVD Risk score (Arnett DK, et al., 2019) failed to calculate for the following reasons:   The 2019 ASCVD risk score is only valid for ages 51 to 76    Depression screen PHQ 2/9 10/01/2020 04/01/2020 12/01/2019  Decreased Interest 0 0 0  Down, Depressed, Hopeless 0 0 0  PHQ - 2 Score 0 0 0  Some recent data might be hidden      Social History   Tobacco Use  Smoking Status Never  Smokeless Tobacco Never   BP Readings from Last 3 Encounters:  11/05/20 138/69  11/04/20 132/68  11/03/20 131/77   Pulse Readings from Last 3 Encounters:  11/05/20 90  11/04/20 98  11/03/20 (!) 101   Wt Readings from Last 3 Encounters:  11/01/20 (!) 317 lb (143.8 kg)  10/04/20 (!) 321 lb 9.6 oz (145.9 kg)  10/01/20 (!) 316 lb 3.2 oz (143.4 kg)   BMI Readings from Last 3 Encounters:  11/01/20 40.16 kg/m  10/04/20 40.74 kg/m  10/01/20 40.05 kg/m    Assessment/Interventions: Review of patient past medical history, allergies, medications, health status, including review of consultants reports, laboratory and other test data, was performed as part of comprehensive evaluation and provision of chronic care management services.    SDOH:  (Social Determinants of Health) assessments and interventions performed: Yes   Financial Resource Strain: Low Risk    Difficulty of Paying Living Expenses: Not very hard    SDOH Screenings   Alcohol Screen: Not on file  Depression (PHQ2-9): Low Risk    PHQ-2 Score: 0  Financial Resource Strain: Low Risk    Difficulty of Paying Living Expenses: Not very hard  Food Insecurity: Not on file  Housing: Not on file  Physical Activity: Not on file  Social Connections: Not on file  Stress: Not on file  Tobacco Use: Low Risk    Smoking Tobacco Use: Never   Smokeless Tobacco Use: Never  Transportation Needs: Not on file    [place]  No Known Allergies  Medications Reviewed Today     Reviewed by Edythe Clarity, [facility] (Pharmacist) on 11/26/20 at  1101  Med List Status: <None>   Medication Order Taking? Sig Documenting Provider Last Dose Status Informant  allopurinol (ZYLOPRIM) 300 MG tablet [id number] Yes TAKE 1 TABLET BY MOUTH EVERY Samuel Sindy Guadeloupe, MD Taking Active   aspirin EC 81 MG tablet [id number] Yes Take 81 mg by mouth daily. [provider] Taking Active   furosemide (LASIX) 20 MG tablet [id number] Yes Take 1 tablet (20 mg total) by mouth daily. Sindy Guadeloupe, MD Taking Active   hydrochlorothiazide (MICROZIDE) 12.5 MG capsule [id number] Yes TAKE 1 CAPSULE BY MOUTH EVERY Samuel Sindy Guadeloupe, MD Taking Active   pantoprazole (PROTONIX) 40 MG tablet [id number] Yes Take 1 tablet (40 mg total) by mouth daily. Lavera Guise, MD Taking Active   potassium chloride SA (KLOR-CON M20) 20 MEQ tablet [id number] Yes Take 1 tablet (20 mEq total) by mouth 2 (two) times daily. Sindy Guadeloupe, MD Taking Active   Tdap Durwin Reges) 5-2.5-18.5 LF-MCG/0.5 injection [id number] Yes Inject into the muscle once. [provider] Taking Active   traZODone (DESYREL) 50 MG tablet [id number] Yes Take 1 tablet (50 mg total) by mouth at bedtime. Sindy Guadeloupe, MD Taking Active    triamcinolone ointment (KENALOG) 0.5 % [id number] Yes APPLY 1 APPLICATION TOPICALLY 2 TIMES DAILY. TO THE AFFECTED AREAS OF FEET AND LEFT SIDE OF STOMACH Jacquelin Hawking, NP Taking Active   valACYclovir (VALTREX) 500 MG tablet [id number] Yes TAKE 1 TABLET BY MOUTH EVERY Samuel Sindy Guadeloupe, MD Taking Active   Zoster Vaccine Adjuvanted [facility]) injection [id number] Yes Inject 0.5 mLs into the muscle once. 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[place]) 09/13/2018   Left wrist pain 07/16/2018   Leukocytosis 03/25/2018   Acute gouty arthritis 03/25/2018   Need for vaccination against Streptococcus pneumoniae using pneumococcal conjugate vaccine 13 01/16/2018   Encounter for general adult medical examination with abnormal findings 07/13/2017   Dysuria 07/13/2017   Closed fracture of rib of left side with routine healing 06/24/2017   Gastroesophageal reflux disease without esophagitis 06/24/2017   Morbid obesity with BMI of 40.0-44.9, adult ([place]) 03/13/2017   Primary osteoarthritis of right hip 03/13/2017   Trochanteric bursitis of right hip 03/13/2017   Other cervical disc degeneration at C5-C6 level 08/31/2016   Polio 08/31/2016   Essential hypertension, benign 04/05/2015   Iron deficiency anemia due to chronic blood loss 04/05/2015    Immunization History  Administered Date(s) Administered   Fluad Quad(high Dose 65+) 12/30/2018   Influenza Inj Mdck Quad Pf 12/01/2019   Influenza, High Dose Seasonal PF 12/04/2016, 11/23/2017   PFIZER(Purple Top)SARS-COV-2 Vaccination 05/31/2019, 07/01/2019     Conditions to be addressed/monitored:  HTN, GERD, Obesity, Chronic Leukemia  Care Plan : General Pharmacy (Adult)  Updates made by Edythe Clarity, RPH since 11/26/2020 12:00 AM     Problem: HTN, GERD, Obesity, Chronic Leukemia   Priority: High  Onset Date: 07/20/2020     Long-Range Goal: Patient-Specific Goal   Start Date: 07/20/2020  Expected End Date: 01/20/2021  Recent Progress: On track  Priority: High  Note:  Current Barriers:  Bilateral swelling in both feet.  Pharmacist Clinical Goal(s):  Patient will achieve adherence to monitoring guidelines and medication adherence to achieve therapeutic efficacy maintain control of blood pressure as evidenced by home monitoring  adhere to prescribed medication regimen as evidenced by fill dates contact provider office for questions/concerns as evidenced notation of same in electronic health record through collaboration with PharmD and provider.   Interventions: 1:1 collaboration with Lavera Guise, MD regarding development and update of comprehensive plan of care as evidenced by provider attestation and co-signature Inter-disciplinary care team collaboration (see longitudinal plan of care) Comprehensive medication review performed; medication list updated in electronic medical record  Hypertension (BP goal <140/90) -Controlled -Current treatment: HCTZ 12.95m daily Furosemide 259mdaily -Medications previously tried: Valsartan/HCTZ  -Current home readings: "good" no logs present today, reports he checks periodically and it usually runs about what it does in office.  Last office Bps have been excellent -Current exercise habits: mows yards occasionally, not much outside of that -Denies hypotensive/hypertensive symptoms -Educated on BP goals and benefits of medications for prevention of heart attack, stroke and kidney damage; Importance of home blood pressure monitoring; Symptoms of hypotension and importance of maintaining  adequate hydration; -Counseled to monitor BP at home a few time per week, document, and provide log at future appointments -Does have some bilateral swelling in both feet, noticed this about a week ago.  It is not red or painful or warm to the touch. -Was given some Lasix by oncology nurse to take for a short period of time but he has not picked up yet from pharmacy. -Recommended to continue current medication Counseled on elevating feet whenever possible to alleviate swelling. Recommended he [facility] Rx from [facility] for [facility]ix and take a dose in the morning to see if this helps.  Counseled to only take this medication on prn basis.  Update 11/26/20 Taking is lasix now, swelling has improved although he still has it sometimes. No logs for BP but reports it has been good. [place] lower extremity did not show any evidence of DVT, discussed these results with patient.  No changes continue meds for now.  GERD (Goal: Minimize symptoms) -Controlled -Current treatment  Pantoprazole 4037maily -Medications previously tried: none noted -Verbalizes correct medication administration -Denies symptoms unless he forgets a dose -Recommended to continue current medication  Update 11/26/20 GERD symptoms remain controlled No changes needed at this time Continue meds  Leukemia (Goal: Remission) -Controlled -Current treatment  Chemotherapy  -Medications previously tried: none noted -Patient receiving treatments 5 consecutive days per month -He reports he has never had any symptoms from the cancer or has not had any effect from the chemotherapy up to this point.  -Recommended continue current management strategy  Patient Goals/Self-Care Activities Patient will:  - take medications as prescribed check blood pressure a few times per week, document, and provide at future appointments Start Lasix on prn basis for swelling.  Follow Up Plan: The care management team will reach out to the patient again over  the next 120 days.            Medication Assistance: None required.  Patient affirms current coverage meets needs.  Patient's [facility] pharmacy is:  [id number] [place] [id number]: [phone]: [phone] pill box? Yes Pt endorses 100% compliance  We discussed: Benefits of medication synchronization, packaging and delivery as well as enhanced  pharmacist oversight with [employer]. Patient decided to: Continue current medication management strategy  Care Plan and Follow Up Patient Decision:  Patient agrees to Care Plan and Follow-up.  Plan: The care management team will reach out to the patient again over the next 120 days.  Beverly Milch, PharmD Clinical Pharmacist [facility] [phone]

## 2020-11-26 ENCOUNTER — Ambulatory Visit: Payer: Medicare Other | Admitting: Pharmacist

## 2020-11-26 DIAGNOSIS — I10 Essential (primary) hypertension: Secondary | ICD-10-CM

## 2020-11-26 DIAGNOSIS — K219 Gastro-esophageal reflux disease without esophagitis: Secondary | ICD-10-CM

## 2020-11-29 ENCOUNTER — Encounter: Payer: Self-pay | Admitting: Oncology

## 2020-11-29 ENCOUNTER — Inpatient Hospital Stay: Payer: Medicare Other

## 2020-11-29 ENCOUNTER — Inpatient Hospital Stay (HOSPITAL_BASED_OUTPATIENT_CLINIC_OR_DEPARTMENT_OTHER): Payer: Medicare Other | Admitting: Oncology

## 2020-11-29 VITALS — BP 126/75 | HR 83 | Temp 97.8°F | Resp 17 | Wt 320.0 lb

## 2020-11-29 DIAGNOSIS — C9310 Chronic myelomonocytic leukemia not having achieved remission: Secondary | ICD-10-CM

## 2020-11-29 DIAGNOSIS — C9311 Chronic myelomonocytic leukemia, in remission: Secondary | ICD-10-CM | POA: Diagnosis not present

## 2020-11-29 DIAGNOSIS — Z79899 Other long term (current) drug therapy: Secondary | ICD-10-CM | POA: Diagnosis not present

## 2020-11-29 DIAGNOSIS — Z5111 Encounter for antineoplastic chemotherapy: Secondary | ICD-10-CM

## 2020-11-29 LAB — CBC WITH DIFFERENTIAL/PLATELET
Abs Immature Granulocytes: 0.83 10*3/uL — ABNORMAL HIGH (ref 0.00–0.07)
Basophils Absolute: 0.2 10*3/uL — ABNORMAL HIGH (ref 0.0–0.1)
Basophils Relative: 1 %
Eosinophils Absolute: 0.4 10*3/uL (ref 0.0–0.5)
Eosinophils Relative: 2 %
HCT: 47.3 % (ref 39.0–52.0)
Hemoglobin: 15 g/dL (ref 13.0–17.0)
Immature Granulocytes: 5 %
Lymphocytes Relative: 13 %
Lymphs Abs: 2.1 10*3/uL (ref 0.7–4.0)
MCH: 27.4 pg (ref 26.0–34.0)
MCHC: 31.7 g/dL (ref 30.0–36.0)
MCV: 86.5 fL (ref 80.0–100.0)
Monocytes Absolute: 1.9 10*3/uL — ABNORMAL HIGH (ref 0.1–1.0)
Monocytes Relative: 12 %
Neutro Abs: 10.6 10*3/uL — ABNORMAL HIGH (ref 1.7–7.7)
Neutrophils Relative %: 67 %
Platelets: 344 10*3/uL (ref 150–400)
RBC: 5.47 MIL/uL (ref 4.22–5.81)
RDW: 18.5 % — ABNORMAL HIGH (ref 11.5–15.5)
Smear Review: ADEQUATE
WBC: 16 10*3/uL — ABNORMAL HIGH (ref 4.0–10.5)
nRBC: 0.2 % (ref 0.0–0.2)

## 2020-11-29 LAB — COMPREHENSIVE METABOLIC PANEL
ALT: 11 U/L (ref 0–44)
AST: 14 U/L — ABNORMAL LOW (ref 15–41)
Albumin: 4.1 g/dL (ref 3.5–5.0)
Alkaline Phosphatase: 78 U/L (ref 38–126)
Anion gap: 6 (ref 5–15)
BUN: 12 mg/dL (ref 8–23)
CO2: 29 mmol/L (ref 22–32)
Calcium: 8.9 mg/dL (ref 8.9–10.3)
Chloride: 101 mmol/L (ref 98–111)
Creatinine, Ser: 0.77 mg/dL (ref 0.61–1.24)
GFR, Estimated: >60 mL/min (ref >60)
Glucose, Bld: 103 mg/dL — ABNORMAL HIGH (ref 70–99)
Potassium: 3.8 mmol/L (ref 3.5–5.1)
Sodium: 136 mmol/L (ref 135–145)
Total Bilirubin: 0.8 mg/dL (ref 0.3–1.2)
Total Protein: 7.8 g/dL (ref 6.5–8.1)

## 2020-11-29 MED ORDER — AZACITIDINE CHEMO SQ INJECTION
50.0000 mg/m2 | Freq: Once | INTRAMUSCULAR | Status: AC
  Administered 2020-11-29: 132.5 mg via SUBCUTANEOUS
  Filled 2020-11-29: qty 5.3

## 2020-11-29 MED ORDER — ONDANSETRON HCL 4 MG PO TABS
8.0000 mg | ORAL_TABLET | Freq: Once | ORAL | Status: AC
  Administered 2020-11-29: 8 mg via ORAL
  Filled 2020-11-29: qty 2

## 2020-11-29 NOTE — Progress Notes (Signed)
Patient here for oncology follow-up appointment, expresses no complaints or concerns at this time.    

## 2020-11-29 NOTE — Progress Notes (Signed)
Hematology/Oncology Consult note [facility]  Telephone:([phone] [phone]  Patient Care Team: Lavera Guise, MD as PCP - General (Internal Medicine) Sindy Guadeloupe, MD as Consulting Physician (Oncology) Edythe Clarity, [facility] as Pharmacist (Pharmacist)   Name of the patient: Samuel Frank  [id number]  May 02, 1940   Date of visit: 11/29/20  Diagnosis-CMML 1  Chief complaint/ Reason for visit-on treatment assessment prior to next cycle of Vidaza  Heme/Onc history: Patient is a 80 yr old male with no significant co morbidities other than hypertension.  He has recently been having bilateral knee pain and has undergone steroid injection in his knee.Patient was noted to have a high white count on his routine exam.  His white count was 19.2 on 07/22/2018 with an H&H of 11.4/35.1 and a platelet count of 470.  At that point he was prescribed a course of antibiotics and a repeat blood work on 08/21/2018 showed white count of 42.3, H&H of 7.1/23 with an MCV of 84 and a platelet count of 791 and hence the patient was referred to hematology for further management.     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  In addition the core biopsy showed a small focus of fibrosis containing scattering of mast cells and eosinophils most suggestive of systemic mastocytosis.bone marrow blasts 7%.  Flow cytometry showed increased number of monocytic cells representing 16% of all cells with expression for HLA-DR, CD11b, CD13, CD14, CD33, CD38, CD56 and CD56 and CD64 with [facility] CD 117 are CD34. normal cytogenetics, CBL, SRSF2, SH2B3 and TET2    Repeat bone marrow biopsy after 4 cycles of Vidaza showed less pronounced monocytic/granulocytic proliferation and overall better granulopoiesis and no increase in blasts.  In addition features of systemic mastocytosis not present.   After 9 cycles of Vidaza patient was noted to have  significant anemia and worsening thrombocytosis for which a repeat bone marrow biopsy was done.  Bone marrow did not show any features of progression of CMML.  Overall stable findings and no increased blast.He was found to have iron deficiency and received 2 doses of Feraheme.   Patient seen by [facility] clinic GI and underwent EGD and colonoscopy for iron deficiency anemia.Colonoscopy showed nonbleeding internal hemorrhoids.  Normal mucosa in the colon.  No polyps EGD showed normal esophagus and gastric mucosal atrophy.  No stigmata of bleeding  Interval history-he has on and off leg swelling Especially in his right leg.  He is on Lasix as well as hydrochlorothiazide.  Denies any new complaints at this time.  He is concerned about his weight gain and he is back to 320 pounds as compared to 307 pounds when he was about 6 months prior.  ECOG PS- 1 Pain scale- 0   Review of systems- Review of Systems  Constitutional:  Negative for chills, fever, malaise/fatigue and weight loss.  HENT:  Negative for congestion, ear discharge and nosebleeds.   Eyes:  Negative for blurred vision.  Respiratory:  Negative for cough, hemoptysis, sputum production, shortness of breath and wheezing.   Cardiovascular:  Positive for leg swelling. Negative for chest pain, palpitations, orthopnea and claudication.  Gastrointestinal:  Negative for abdominal pain, blood in stool, constipation, diarrhea, heartburn, melena, nausea and vomiting.  Genitourinary:  Negative for dysuria, flank pain, frequency, hematuria and urgency.  Musculoskeletal:  Negative for back pain, joint pain and myalgias.  Skin:  Negative for rash.  Neurological:  Negative for dizziness, tingling,  focal weakness, seizures, weakness and headaches.  Endo/Heme/Allergies:  Does not bruise/bleed easily.  Psychiatric/Behavioral:  Negative for depression and suicidal ideas. The patient does not have insomnia.       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[place])    Dyspnea    Hypertension      Past Surgical History:  Procedure Laterality Date   COLONOSCOPY N/A 08/28/2019   Procedure: COLONOSCOPY;  Surgeon: Toledo, Benay Pike, MD;  Location: [facility] ENDOSCOPY;  Service: Gastroenterology;  Laterality: N/A;   COLONOSCOPY WITH PROPOFOL N/A 05/03/2015   Procedure: COLONOSCOPY WITH PROPOFOL;  Surgeon: [name], MD;  Location: [facility] ENDOSCOPY;  Service: Gastroenterology;  Laterality: N/A;   ESOPHAGOGASTRODUODENOSCOPY N/A 08/28/2019   Procedure: ESOPHAGOGASTRODUODENOSCOPY (EGD);  Surgeon: Toledo, Benay Pike, MD;  Location: [facility] ENDOSCOPY;  Service: Gastroenterology;  Laterality: N/A;   ESOPHAGOGASTRODUODENOSCOPY (EGD) WITH PROPOFOL N/A 05/03/2015   Procedure: ESOPHAGOGASTRODUODENOSCOPY (EGD) WITH PROPOFOL;  Surgeon: [name], MD;  Location: [facility] ENDOSCOPY;  Service: Gastroenterology;  Laterality: N/A;   HERNIA REPAIR     1975    Social History   Socioeconomic History   Marital status: Divorced    Spouse name: Not on file   Number of children: 4   Years of education: Not on file   Highest education level: Not on file  Occupational History   Occupation: retired    Comment: [employer]  Tobacco Use   Smoking status: Never   Smokeless tobacco: Never  Vaping Use   Vaping Use: Never used  Substance and Sexual Activity   Alcohol use: No   Drug use: No   Sexual activity: Not Currently  Other Topics Concern   Not on file  Social History Narrative   Not on file   Social Determinants of Health   Financial Resource Strain: Low Risk    Difficulty of Paying Living Expenses: Not very hard  Food Insecurity: Not on file  Transportation Needs: Not on file  Physical Activity: Not on file  Stress: Not on file  Social Connections: Not on file  Intimate Partner Violence: Not on file    Family History  Problem Relation  Age of Onset   Alzheimer's disease Father    Lung cancer Sister    Cancer Sister      Current Outpatient Medications:    allopurinol (ZYLOPRIM) 300 MG tablet, TAKE 1 TABLET BY MOUTH EVERY DAY, Disp: 90 tablet, Rfl: 1   aspirin EC 81 MG tablet, Take 81 mg by mouth daily., Disp: , Rfl:    furosemide (LASIX) 20 MG tablet, Take 1 tablet (20 mg total) by mouth daily., Disp: 5 tablet, Rfl: 0   hydrochlorothiazide (MICROZIDE) 12.5 MG capsule, TAKE 1 CAPSULE BY MOUTH EVERY DAY, Disp: 30 capsule, Rfl: 5   pantoprazole (PROTONIX) 40 MG tablet, Take 1 tablet (40 mg total) by mouth daily., Disp: 90 tablet, Rfl: 0   potassium chloride SA (KLOR-CON M20) 20 MEQ tablet, Take 1 tablet (20 mEq total) by mouth 2 (two) times daily., Disp: 180 tablet, Rfl: 1   Tdap (BOOSTRIX) 5-2.5-18.5 LF-MCG/0.5 injection, Inject into the muscle once., Disp: , Rfl:    traZODone (DESYREL) 50 MG tablet, Take 1 tablet (50 mg total) by mouth at bedtime., Disp: 30 tablet, Rfl: 1   triamcinolone ointment (KENALOG) 0.5 %, APPLY 1 APPLICATION TOPICALLY 2 TIMES DAILY. TO THE  AFFECTED AREAS OF FEET AND LEFT SIDE OF STOMACH, Disp: 30 g, Rfl: 0   valACYclovir (VALTREX) 500 MG tablet, TAKE 1 TABLET BY MOUTH EVERY DAY, Disp: 90 tablet, Rfl: 1   Zoster Vaccine Adjuvanted (SHINGRIX) injection, Inject 0.5 mLs into the muscle once., Disp: , Rfl:   Physical exam:  Vitals:   11/29/20 0902  BP: 126/75  Pulse: 83  Resp: 17  Temp: 97.8 F (36.6 C)  TempSrc: Tympanic  SpO2: 99%  Weight: (!) 320 lb (145.2 kg)   Physical Exam Cardiovascular:     Rate and Rhythm: Normal rate and regular rhythm.     Heart sounds: Normal heart sounds.  Pulmonary:     Effort: Pulmonary effort is normal.     Breath sounds: Normal breath sounds.  Abdominal:     General: Bowel sounds are normal.     Palpations: Abdomen is soft.  Musculoskeletal:     Comments: Bilateral +1 edema  Skin:    General: Skin is warm and dry.  Neurological:     Mental Status: He  is alert and oriented to person, place, and time.     CMP Latest Ref Rng & Units 11/01/2020  Glucose 70 - 99 mg/dL 104(H)  BUN 8 - 23 mg/dL 11  Creatinine 0.61 - 1.24 mg/dL 0.77  Sodium 135 - 145 mmol/L 135  Potassium 3.5 - 5.1 mmol/L 3.9  Chloride 98 - 111 mmol/L 100  CO2 22 - 32 mmol/L 27  Calcium 8.9 - 10.3 mg/dL 8.8(L)  Total Protein 6.5 - 8.1 g/dL 7.7  Total Bilirubin 0.3 - 1.2 mg/dL 0.6  Alkaline Phos 38 - 126 U/L 81  AST 15 - 41 U/L 16  ALT 0 - 44 U/L 14   CBC Latest Ref Rng & Units 11/01/2020  WBC 4.0 - 10.5 K/uL 14.9(H)  Hemoglobin 13.0 - 17.0 g/dL 14.8  Hematocrit 39.0 - 52.0 % 46.4  Platelets 150 - 400 K/uL 455(H)    No images are attached to the encounter.  US Venous Img Lower Unilateral Right  Result Date: 11/04/2020 CLINICAL DATA:  Right lower extremity edema for the past 3 months. Evaluate for DVT. EXAM: RIGHT LOWER EXTREMITY VENOUS DOPPLER ULTRASOUND TECHNIQUE: Gray-scale sonography with graded compression, as well as color Doppler and duplex ultrasound were performed to evaluate the lower extremity deep venous systems from the level of the common femoral vein and including the common femoral, femoral, profunda femoral, popliteal and calf veins including the posterior tibial, peroneal and gastrocnemius veins when visible. The superficial great saphenous vein was also interrogated. Spectral Doppler was utilized to evaluate flow at rest and with distal augmentation maneuvers in the common femoral, femoral and popliteal veins. COMPARISON:  None. FINDINGS: Contralateral Common Femoral Vein: Respiratory phasicity is normal and symmetric with the symptomatic side. No evidence of thrombus. Normal compressibility. Common Femoral Vein: No evidence of thrombus. Normal compressibility, respiratory phasicity and response to augmentation. Saphenofemoral Junction: No evidence of thrombus. Normal compressibility and flow on color Doppler imaging. Profunda Femoral Vein: No evidence of  thrombus. Normal compressibility and flow on color Doppler imaging. Femoral Vein: No evidence of thrombus. Normal compressibility, respiratory phasicity and response to augmentation. Popliteal Vein: No evidence of thrombus. Normal compressibility, respiratory phasicity and response to augmentation. Calf Veins: No evidence of thrombus. Normal compressibility and flow on color Doppler imaging. Superficial Great Saphenous Vein: No evidence of thrombus. Normal compressibility. Venous Reflux:  None. Other Findings: There is a minimal amount of subcutaneous edema at the level  the right calf (image 39). IMPRESSION: No evidence of DVT within the right lower extremity. Electronically Signed   By: Sandi Mariscal M.D.   On: 11/04/2020 12:49     Assessment and plan- Patient is a 80 y.o. male with history of CMML 1 here for on treatment assessment prior to next cycle of Vidaza  Patient has stable mild leukocytosis/monocytosis.  Hemoglobin is normal and platelet counts fluctuate but overall stable.  Plan is to continue Vidaza until progression or toxicity.Last bone marrow biopsy was done in April 2021 which showed no evidence of disease progression but persistent findings of CMML.  He will proceed with Vidaza day 1 to day 5 starting today and I will see him back in 4 weeks for next cycle  Leg swelling: Chronic.  Currently on Lasix as well as hydrochlorothiazide.  Continue to monitor   Visit Diagnosis 1. Encounter for antineoplastic chemotherapy   2. 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 Randa Evens, MD, MPH [facility] [phone] 11/29/2020 7:54 AM

## 2020-11-29 NOTE — Patient Instructions (Signed)
[facility] ONCOLOGY  Discharge Instructions: Thank you for choosing [facility] to provide your oncology and hematology care.  If you have a lab appointment with the [facility], please go directly to the [facility] and check in at the registration area.  Wear comfortable clothing and clothing appropriate for easy access to any Portacath or PICC line.   We strive to give you quality time with your provider. You may need to reschedule your appointment if you arrive late (15 or more minutes).  Arriving late affects you and other patients whose appointments are after yours.  Also, if you miss three or more appointments without notifying the office, you may be dismissed from the clinic at the provider's discretion.      For prescription refill requests, have your pharmacy contact our office and allow 72 hours for refills to be completed.    Today you received the following chemotherapy and/or immunotherapy agents VIDAZA      To help prevent nausea and vomiting after your treatment, we encourage you to take your nausea medication as directed.  BELOW ARE SYMPTOMS THAT SHOULD BE REPORTED IMMEDIATELY: *FEVER GREATER THAN 100.4 F (38 C) OR HIGHER *CHILLS OR SWEATING *NAUSEA AND VOMITING THAT IS NOT CONTROLLED WITH YOUR NAUSEA MEDICATION *UNUSUAL SHORTNESS OF BREATH *UNUSUAL BRUISING OR BLEEDING *URINARY PROBLEMS (pain or burning when urinating, or frequent urination) *BOWEL PROBLEMS (unusual diarrhea, constipation, pain near the anus) TENDERNESS IN MOUTH AND THROAT WITH OR WITHOUT PRESENCE OF ULCERS (sore throat, sores in mouth, or a toothache) UNUSUAL RASH, SWELLING OR PAIN  UNUSUAL VAGINAL DISCHARGE OR ITCHING   Items with * indicate a potential emergency and should be followed up as soon as possible or go to the Emergency Department if any problems should occur.  Please show the CHEMOTHERAPY ALERT CARD or IMMUNOTHERAPY ALERT CARD at check-in to  the Emergency Department and triage nurse.  Should you have questions after your visit or need to cancel or reschedule your appointment, please contact [facility]  [phone] and follow the prompts.  Office hours are 8:00 a.m. to 4:30 p.m. Monday - Friday. Please note that voicemails left after 4:00 p.m. may not be returned until the following business day.  We are closed weekends and major holidays. You have access to a nurse at all times for urgent questions. Please call the main number to the clinic [phone] and follow the prompts.  For any non-urgent questions, you may also contact your provider using MyChart. We now offer e-Visits for anyone 39 and older to request care online for non-urgent symptoms. For details visit [email or web address].   Also download the MyChart app! Go to the app store, search "MyChart", open the app, select [facility], and log in with your MyChart username and password.  Due to Covid, a mask is required upon entering the hospital/clinic. If you do not have a mask, one will be given to you upon arrival. For doctor visits, patients may have 1 support person aged 78 or older with them. For treatment visits, patients cannot have anyone with them due to current Covid guidelines and our immunocompromised population.   Azacitidine suspension for injection (subcutaneous use) What is this medication? AZACITIDINE (ay Lucan) is a chemotherapy drug. This medicine reduces the growth of cancer cells and can suppress the immune system. It is used for treating myelodysplastic syndrome or some types of leukemia. This medicine may be used for other purposes; ask  your health care provider or pharmacist if you have questions. COMMON BRAND NAME(S): Vidaza What should I tell my care team before I take this medication? They need to know if you have any of these conditions: kidney disease liver disease liver tumors an unusual or  allergic reaction to azacitidine, mannitol, other medicines, foods, dyes, or preservatives pregnant or trying to get pregnant breast-feeding How should I use this medication? This medicine is for injection under the skin. It is administered in a hospital or clinic by a specially trained health care professional. Talk to your pediatrician regarding the use of this medicine in children. While this drug may be prescribed for selected conditions, precautions do apply. Overdosage: If you think you have taken too much of this medicine contact a poison control center or emergency room at once. NOTE: This medicine is only for you. Do not share this medicine with others. What if I miss a dose? It is important not to miss your dose. Call your doctor or health care professional if you are unable to keep an appointment. What may interact with this medication? Interactions have not been studied. Give your health care provider a list of all the medicines, herbs, non-prescription drugs, or dietary supplements you use. Also tell them if you smoke, drink alcohol, or use illegal drugs. Some items may interact with your medicine. This list may not describe all possible interactions. Give your health care provider a list of all the medicines, herbs, non-prescription drugs, or dietary supplements you use. Also tell them if you smoke, drink alcohol, or use illegal drugs. Some items may interact with your medicine. What should I watch for while using this medication? Visit your doctor for checks on your progress. This drug may make you feel generally unwell. This is not uncommon, as chemotherapy can affect healthy cells as well as cancer cells. Report any side effects. Continue your course of treatment even though you feel ill unless your doctor tells you to stop. In some cases, you may be given additional medicines to help with side effects. Follow all directions for their use. Call your doctor or health care  professional for advice if you get a fever, chills or sore throat, or other symptoms of a cold or flu. Do not treat yourself. This drug decreases your body's ability to fight infections. Try to avoid being around people who are sick. This medicine may increase your risk to bruise or bleed. Call your doctor or health care professional if you notice any unusual bleeding. You may need blood work done while you are taking this medicine. Do not become pregnant while taking this medicine and for 6 months after the last dose. Women should inform their doctor if they wish to become pregnant or think they might be pregnant. Men should not father a child while taking this medicine and for 3 months after the last dose. There is a potential for serious side effects to an unborn child. Talk to your health care professional or pharmacist for more information. Do not breast-feed an infant while taking this medicine and for 1 week after the last dose. This medicine may interfere with the ability to have a child. Talk with your doctor or health care professional if you are concerned about your fertility. What side effects may I notice from receiving this medication? Side effects that you should report to your doctor or health care professional as soon as possible: allergic reactions like skin rash, itching or hives, swelling of the face, lips,  or tongue low blood counts - this medicine may decrease the number of white blood cells, red blood cells and platelets. You may be at increased risk for infections and bleeding. signs of infection - fever or chills, cough, sore throat, pain passing urine signs of decreased platelets or bleeding - bruising, pinpoint red spots on the skin, black, tarry stools, blood in the urine signs of decreased red blood cells - unusually weak or tired, fainting spells, lightheadedness signs and symptoms of kidney injury like trouble passing urine or change in the amount of urine signs and  symptoms of liver injury like dark yellow or brown urine; general ill feeling or flu-like symptoms; light-colored stools; loss of appetite; nausea; right upper belly pain; unusually weak or tired; yellowing of the eyes or skin Side effects that usually do not require medical attention (report to your doctor or health care professional if they continue or are bothersome): constipation diarrhea nausea, vomiting pain or redness at the injection site unusually weak or tired This list may not describe all possible side effects. Call your doctor for medical advice about side effects. You may report side effects to FDA at 1-800-FDA-1088. Where should I keep my medication? This drug is given in a hospital or clinic and will not be stored at home. NOTE: This sheet is a summary. It may not cover all possible information. If you have questions about this medicine, talk to your doctor, pharmacist, or health care provider.  2022 Elsevier/Gold Standard (2016-03-21 14:37:51)

## 2020-11-30 ENCOUNTER — Inpatient Hospital Stay: Payer: Medicare Other

## 2020-11-30 VITALS — BP 118/73 | HR 82 | Temp 96.9°F | Resp 18

## 2020-11-30 DIAGNOSIS — C9310 Chronic myelomonocytic leukemia not having achieved remission: Secondary | ICD-10-CM

## 2020-11-30 DIAGNOSIS — Z79899 Other long term (current) drug therapy: Secondary | ICD-10-CM | POA: Diagnosis not present

## 2020-11-30 DIAGNOSIS — C9311 Chronic myelomonocytic leukemia, in remission: Secondary | ICD-10-CM | POA: Diagnosis not present

## 2020-11-30 DIAGNOSIS — Z5111 Encounter for antineoplastic chemotherapy: Secondary | ICD-10-CM | POA: Diagnosis not present

## 2020-11-30 MED ORDER — ONDANSETRON HCL 4 MG PO TABS
8.0000 mg | ORAL_TABLET | Freq: Once | ORAL | Status: AC
  Administered 2020-11-30: 8 mg via ORAL
  Filled 2020-11-30: qty 2

## 2020-11-30 MED ORDER — AZACITIDINE CHEMO SQ INJECTION
50.0000 mg/m2 | Freq: Once | INTRAMUSCULAR | Status: AC
  Administered 2020-11-30: 132.5 mg via SUBCUTANEOUS
  Filled 2020-11-30: qty 5.3

## 2020-12-01 ENCOUNTER — Inpatient Hospital Stay: Payer: Medicare Other

## 2020-12-01 VITALS — BP 136/68 | HR 99 | Temp 96.6°F | Resp 20

## 2020-12-01 DIAGNOSIS — Z79899 Other long term (current) drug therapy: Secondary | ICD-10-CM | POA: Diagnosis not present

## 2020-12-01 DIAGNOSIS — Z5111 Encounter for antineoplastic chemotherapy: Secondary | ICD-10-CM | POA: Diagnosis not present

## 2020-12-01 DIAGNOSIS — C9311 Chronic myelomonocytic leukemia, in remission: Secondary | ICD-10-CM | POA: Diagnosis not present

## 2020-12-01 DIAGNOSIS — C9310 Chronic myelomonocytic leukemia not having achieved remission: Secondary | ICD-10-CM

## 2020-12-01 MED ORDER — AZACITIDINE CHEMO SQ INJECTION
50.0000 mg/m2 | Freq: Once | INTRAMUSCULAR | Status: AC
  Administered 2020-12-01: 132.5 mg via SUBCUTANEOUS
  Filled 2020-12-01: qty 5.3

## 2020-12-01 MED ORDER — ONDANSETRON HCL 4 MG PO TABS
8.0000 mg | ORAL_TABLET | Freq: Once | ORAL | Status: AC
  Administered 2020-12-01: 8 mg via ORAL
  Filled 2020-12-01: qty 2

## 2020-12-01 NOTE — Patient Instructions (Signed)
[facility]  Discharge Instructions: Thank you for choosing [facility] to provide your oncology and hematology care.  If you have a lab appointment with the Cancer Center, please go directly to the Cancer Center and check in at the registration area.  Wear comfortable clothing and clothing appropriate for easy access to any Portacath or PICC line.   We strive to give you quality time with your provider. You may need to reschedule your appointment if you arrive late (15 or more minutes).  Arriving late affects you and other patients whose appointments are after yours.  Also, if you miss three or more appointments without notifying the office, you may be dismissed from the clinic at the provider's discretion.      For prescription refill requests, have your pharmacy contact our office and allow 72 hours for refills to be completed.      To help prevent nausea and vomiting after your treatment, we encourage you to take your nausea medication as directed.  BELOW ARE SYMPTOMS THAT SHOULD BE REPORTED IMMEDIATELY: *FEVER GREATER THAN 100.4 F (38 C) OR HIGHER *CHILLS OR SWEATING *NAUSEA AND VOMITING THAT IS NOT CONTROLLED WITH YOUR NAUSEA MEDICATION *UNUSUAL SHORTNESS OF BREATH *UNUSUAL BRUISING OR BLEEDING *URINARY PROBLEMS (pain or burning when urinating, or frequent urination) *BOWEL PROBLEMS (unusual diarrhea, constipation, pain near the anus) TENDERNESS IN MOUTH AND THROAT WITH OR WITHOUT PRESENCE OF ULCERS (sore throat, sores in mouth, or a toothache) UNUSUAL RASH, SWELLING OR PAIN  UNUSUAL VAGINAL DISCHARGE OR ITCHING   Items with * indicate a potential emergency and should be followed up as soon as possible or go to the Emergency Department if any problems should occur.  Please show the CHEMOTHERAPY ALERT CARD or IMMUNOTHERAPY ALERT CARD at check-in to the Emergency Department and triage nurse.  Should you have questions after your  visit or need to cancel or reschedule your appointment, please contact [facility]  [phone] and follow the prompts.  Office hours are 8:00 a.m. to 4:30 p.m. Monday - Friday. Please note that voicemails left after 4:00 p.m. may not be returned until the following business day.  We are closed weekends and major holidays. You have access to a nurse at all times for urgent questions. Please call the main number to the clinic [phone] and follow the prompts.  For any non-urgent questions, you may also contact your provider using MyChart. We now offer e-Visits for anyone 18 and older to request care online for non-urgent symptoms. For details visit [email or web address].   Also download the MyChart app! Go to the app store, search "MyChart", open the app, select [facility], and log in with your MyChart username and password.  Due to Covid, a mask is required upon entering the hospital/clinic. If you do not have a mask, one will be given to you upon arrival. For doctor visits, patients may have 1 support person aged 18 or older with them. For treatment visits, patients cannot have anyone with them due to current Covid guidelines and our immunocompromised population.  

## 2020-12-02 ENCOUNTER — Inpatient Hospital Stay: Payer: Medicare Other

## 2020-12-02 ENCOUNTER — Other Ambulatory Visit: Payer: Self-pay | Admitting: *Deleted

## 2020-12-02 VITALS — BP 117/64 | HR 99 | Temp 98.4°F | Resp 20

## 2020-12-02 DIAGNOSIS — C9310 Chronic myelomonocytic leukemia not having achieved remission: Secondary | ICD-10-CM

## 2020-12-02 DIAGNOSIS — Z5111 Encounter for antineoplastic chemotherapy: Secondary | ICD-10-CM | POA: Diagnosis not present

## 2020-12-02 DIAGNOSIS — C9311 Chronic myelomonocytic leukemia, in remission: Secondary | ICD-10-CM | POA: Diagnosis not present

## 2020-12-02 DIAGNOSIS — Z79899 Other long term (current) drug therapy: Secondary | ICD-10-CM | POA: Diagnosis not present

## 2020-12-02 MED ORDER — ONDANSETRON HCL 4 MG PO TABS
8.0000 mg | ORAL_TABLET | Freq: Once | ORAL | Status: AC
  Administered 2020-12-02: 8 mg via ORAL
  Filled 2020-12-02: qty 2

## 2020-12-02 MED ORDER — ALLOPURINOL 300 MG PO TABS: 300.0000 mg | ORAL_TABLET | Freq: Every day | ORAL | 2 refills | Status: AC

## 2020-12-02 MED ORDER — AZACITIDINE CHEMO SQ INJECTION
50.0000 mg/m2 | Freq: Once | INTRAMUSCULAR | Status: AC
  Administered 2020-12-02: 132.5 mg via SUBCUTANEOUS
  Filled 2020-12-02: qty 5.3

## 2020-12-02 NOTE — Patient Instructions (Signed)
[facility]  Discharge Instructions: Thank you for choosing [facility] to provide your oncology and hematology care.  If you have a lab appointment with the [facility], please go directly to the [facility] and check in at the registration area.  Wear comfortable clothing and clothing appropriate for easy access to any Portacath or PICC line.   We strive to give you quality time with your provider. You may need to reschedule your appointment if you arrive late (15 or more minutes).  Arriving late affects you and other patients whose appointments are after yours.  Also, if you miss three or more appointments without notifying the office, you may be dismissed from the clinic at the provider's discretion.      For prescription refill requests, have your pharmacy contact our office and allow 72 hours for refills to be completed.    Today you received the following chemotherapy and/or immunotherapy agents: Vidaza      To help prevent nausea and vomiting after your treatment, we encourage you to take your nausea medication as directed.  BELOW ARE SYMPTOMS THAT SHOULD BE REPORTED IMMEDIATELY: *FEVER GREATER THAN 100.4 F (38 C) OR HIGHER *CHILLS OR SWEATING *NAUSEA AND VOMITING THAT IS NOT CONTROLLED WITH YOUR NAUSEA MEDICATION *UNUSUAL SHORTNESS OF BREATH *UNUSUAL BRUISING OR BLEEDING *URINARY PROBLEMS (pain or burning when urinating, or frequent urination) *BOWEL PROBLEMS (unusual diarrhea, constipation, pain near the anus) TENDERNESS IN MOUTH AND THROAT WITH OR WITHOUT PRESENCE OF ULCERS (sore throat, sores in mouth, or a toothache) UNUSUAL RASH, SWELLING OR PAIN  UNUSUAL VAGINAL DISCHARGE OR ITCHING   Items with * indicate a potential emergency and should be followed up as soon as possible or go to the Emergency Department if any problems should occur.  Please show the CHEMOTHERAPY ALERT CARD or IMMUNOTHERAPY ALERT CARD at check-in to  the Emergency Department and triage nurse.  Should you have questions after your visit or need to cancel or reschedule your appointment, please contact [facility]  [phone] and follow the prompts.  Office hours are 8:00 a.m. to 4:30 p.m. Monday - Friday. Please note that voicemails left after 4:00 p.m. may not be returned until the following business day.  We are closed weekends and major holidays. You have access to a nurse at all times for urgent questions. Please call the main number to the clinic [phone] and follow the prompts.  For any non-urgent questions, you may also contact your provider using MyChart. We now offer e-Visits for anyone 16 and older to request care online for non-urgent symptoms. For details visit [email or web address].   Also download the MyChart app! Go to the app store, search "MyChart", open the app, select [facility], and log in with your MyChart username and password.  Due to Covid, a mask is required upon entering the hospital/clinic. If you do not have a mask, one will be given to you upon arrival. For doctor visits, patients may have 1 support person aged 59 or older with them. For treatment visits, patients cannot have anyone with them due to current Covid guidelines and our immunocompromised population. Azacitidine suspension for injection (subcutaneous use) What is this medication? AZACITIDINE ([place]) is a chemotherapy drug. This medicine reduces the growth of cancer cells and can suppress the immune system. It is used for treating myelodysplastic syndrome or some types of leukemia. This medicine may be used for other purposes; ask your health  care provider or pharmacist if you have questions. COMMON BRAND NAME(S): Vidaza What should I tell my care team before I take this medication? They need to know if you have any of these conditions: kidney disease liver disease liver tumors an unusual or allergic  reaction to azacitidine, mannitol, other medicines, foods, dyes, or preservatives pregnant or trying to get pregnant breast-feeding How should I use this medication? This medicine is for injection under the skin. It is administered in a hospital or clinic by a specially trained health care professional. Talk to your pediatrician regarding the use of this medicine in children. While this drug may be prescribed for selected conditions, precautions do apply. Overdosage: If you think you have taken too much of this medicine contact a poison control center or emergency room at once. NOTE: This medicine is only for you. Do not share this medicine with others. What if I miss a dose? It is important not to miss your dose. Call your doctor or health care professional if you are unable to keep an appointment. What may interact with this medication? Interactions have not been studied. Give your health care provider a list of all the medicines, herbs, non-prescription drugs, or dietary supplements you use. Also tell them if you smoke, drink alcohol, or use illegal drugs. Some items may interact with your medicine. This list may not describe all possible interactions. Give your health care provider a list of all the medicines, herbs, non-prescription drugs, or dietary supplements you use. Also tell them if you smoke, drink alcohol, or use illegal drugs. Some items may interact with your medicine. What should I watch for while using this medication? Visit your doctor for checks on your progress. This drug may make you feel generally unwell. This is not uncommon, as chemotherapy can affect healthy cells as well as cancer cells. Report any side effects. Continue your course of treatment even though you feel ill unless your doctor tells you to stop. In some cases, you may be given additional medicines to help with side effects. Follow all directions for their use. Call your doctor or health care professional for  advice if you get a fever, chills or sore throat, or other symptoms of a cold or flu. Do not treat yourself. This drug decreases your body's ability to fight infections. Try to avoid being around people who are sick. This medicine may increase your risk to bruise or bleed. Call your doctor or health care professional if you notice any unusual bleeding. You may need blood work done while you are taking this medicine. Do not become pregnant while taking this medicine and for 6 months after the last dose. Women should inform their doctor if they wish to become pregnant or think they might be pregnant. Men should not father a child while taking this medicine and for 3 months after the last dose. There is a potential for serious side effects to an unborn child. Talk to your health care professional or pharmacist for more information. Do not breast-feed an infant while taking this medicine and for 1 week after the last dose. This medicine may interfere with the ability to have a child. Talk with your doctor or health care professional if you are concerned about your fertility. What side effects may I notice from receiving this medication? Side effects that you should report to your doctor or health care professional as soon as possible: allergic reactions like skin rash, itching or hives, swelling of the face, lips, or tongue  low blood counts - this medicine may decrease the number of white blood cells, red blood cells and platelets. You may be at increased risk for infections and bleeding. signs of infection - fever or chills, cough, sore throat, pain passing urine signs of decreased platelets or bleeding - bruising, pinpoint red spots on the skin, black, tarry stools, blood in the urine signs of decreased red blood cells - unusually weak or tired, fainting spells, lightheadedness signs and symptoms of kidney injury like trouble passing urine or change in the amount of urine signs and symptoms of liver  injury like dark yellow or brown urine; general ill feeling or flu-like symptoms; light-colored stools; loss of appetite; nausea; right upper belly pain; unusually weak or tired; yellowing of the eyes or skin Side effects that usually do not require medical attention (report to your doctor or health care professional if they continue or are bothersome): constipation diarrhea nausea, vomiting pain or redness at the injection site unusually weak or tired This list may not describe all possible side effects. Call your doctor for medical advice about side effects. You may report side effects to FDA at 1-800-FDA-1088. Where should I keep my medication? This drug is given in a hospital or clinic and will not be stored at home. NOTE: This sheet is a summary. It may not cover all possible information. If you have questions about this medicine, talk to your doctor, pharmacist, or health care provider.  2022 Elsevier/Gold Standard (2016-03-21 14:37:51)

## 2020-12-03 ENCOUNTER — Inpatient Hospital Stay: Payer: Medicare Other

## 2020-12-03 VITALS — BP 126/84 | HR 87 | Temp 98.0°F | Resp 18

## 2020-12-03 DIAGNOSIS — Z79899 Other long term (current) drug therapy: Secondary | ICD-10-CM | POA: Diagnosis not present

## 2020-12-03 DIAGNOSIS — C9311 Chronic myelomonocytic leukemia, in remission: Secondary | ICD-10-CM | POA: Diagnosis not present

## 2020-12-03 DIAGNOSIS — K219 Gastro-esophageal reflux disease without esophagitis: Secondary | ICD-10-CM | POA: Diagnosis not present

## 2020-12-03 DIAGNOSIS — Z5111 Encounter for antineoplastic chemotherapy: Secondary | ICD-10-CM | POA: Diagnosis not present

## 2020-12-03 DIAGNOSIS — I10 Essential (primary) hypertension: Secondary | ICD-10-CM | POA: Diagnosis not present

## 2020-12-03 DIAGNOSIS — C9310 Chronic myelomonocytic leukemia not having achieved remission: Secondary | ICD-10-CM

## 2020-12-03 MED ORDER — ONDANSETRON HCL 4 MG PO TABS
8.0000 mg | ORAL_TABLET | Freq: Once | ORAL | Status: AC
  Administered 2020-12-03: 8 mg via ORAL
  Filled 2020-12-03: qty 2

## 2020-12-03 MED ORDER — AZACITIDINE CHEMO SQ INJECTION
50.0000 mg/m2 | Freq: Once | INTRAMUSCULAR | Status: AC
  Administered 2020-12-03: 132.5 mg via SUBCUTANEOUS
  Filled 2020-12-03: qty 5.3

## 2020-12-03 NOTE — Patient Instructions (Signed)
[facility]  Discharge Instructions: Thank you for choosing [facility] to provide your oncology and hematology care.  If you have a lab appointment with the [facility], please go directly to the [facility] and check in at the registration area.  Wear comfortable clothing and clothing appropriate for easy access to any Portacath or PICC line.   We strive to give you quality time with your provider. You may need to reschedule your appointment if you arrive late (15 or more minutes).  Arriving late affects you and other patients whose appointments are after yours.  Also, if you miss three or more appointments without notifying the office, you may be dismissed from the clinic at the provider's discretion.      For prescription refill requests, have your pharmacy contact our office and allow 72 hours for refills to be completed.    Today you received the following chemotherapy and/or immunotherapy agents Vidaza       To help prevent nausea and vomiting after your treatment, we encourage you to take your nausea medication as directed.  BELOW ARE SYMPTOMS THAT SHOULD BE REPORTED IMMEDIATELY: *FEVER GREATER THAN 100.4 F (38 C) OR HIGHER *CHILLS OR SWEATING *NAUSEA AND VOMITING THAT IS NOT CONTROLLED WITH YOUR NAUSEA MEDICATION *UNUSUAL SHORTNESS OF BREATH *UNUSUAL BRUISING OR BLEEDING *URINARY PROBLEMS (pain or burning when urinating, or frequent urination) *BOWEL PROBLEMS (unusual diarrhea, constipation, pain near the anus) TENDERNESS IN MOUTH AND THROAT WITH OR WITHOUT PRESENCE OF ULCERS (sore throat, sores in mouth, or a toothache) UNUSUAL RASH, SWELLING OR PAIN  UNUSUAL VAGINAL DISCHARGE OR ITCHING   Items with * indicate a potential emergency and should be followed up as soon as possible or go to the Emergency Department if any problems should occur.  Please show the CHEMOTHERAPY ALERT CARD or IMMUNOTHERAPY ALERT CARD at check-in to  the Emergency Department and triage nurse.  Should you have questions after your visit or need to cancel or reschedule your appointment, please contact [facility]  [phone] and follow the prompts.  Office hours are 8:00 a.m. to 4:30 p.m. Monday - Friday. Please note that voicemails left after 4:00 p.m. may not be returned until the following business day.  We are closed weekends and major holidays. You have access to a nurse at all times for urgent questions. Please call the main number to the clinic [phone] and follow the prompts.  For any non-urgent questions, you may also contact your provider using MyChart. We now offer e-Visits for anyone 108 and older to request care online for non-urgent symptoms. For details visit [email or web address].   Also download the MyChart app! Go to the app store, search "MyChart", open the app, select [facility], and log in with your MyChart username and password.  Due to Covid, a mask is required upon entering the hospital/clinic. If you do not have a mask, one will be given to you upon arrival. For doctor visits, patients may have 1 support person aged 62 or older with them. For treatment visits, patients cannot have anyone with them due to current Covid guidelines and our immunocompromised population.

## 2020-12-07 ENCOUNTER — Other Ambulatory Visit: Payer: Self-pay | Admitting: *Deleted

## 2020-12-08 ENCOUNTER — Encounter: Payer: Self-pay | Admitting: Oncology

## 2020-12-08 ENCOUNTER — Other Ambulatory Visit: Payer: Self-pay | Admitting: *Deleted

## 2020-12-08 MED ORDER — POTASSIUM CHLORIDE CRYS ER 20 MEQ PO TBCR: 20.0000 meq | EXTENDED_RELEASE_TABLET | Freq: Two times a day (BID) | ORAL | 1 refills | Status: AC

## 2020-12-08 MED ORDER — TRAZODONE HCL 50 MG PO TABS: 50.0000 mg | ORAL_TABLET | Freq: Every day | ORAL | 1 refills | Status: DC

## 2020-12-23 ENCOUNTER — Ambulatory Visit (INDEPENDENT_AMBULATORY_CARE_PROVIDER_SITE_OTHER): Payer: Medicare Other

## 2020-12-23 ENCOUNTER — Other Ambulatory Visit: Payer: Self-pay

## 2020-12-23 DIAGNOSIS — Z23 Encounter for immunization: Secondary | ICD-10-CM | POA: Diagnosis not present

## 2020-12-27 ENCOUNTER — Inpatient Hospital Stay: Payer: Medicare Other

## 2020-12-27 ENCOUNTER — Other Ambulatory Visit: Payer: Self-pay

## 2020-12-27 VITALS — BP 117/73 | HR 88 | Temp 96.2°F | Resp 18 | Wt 316.2 lb

## 2020-12-27 DIAGNOSIS — C9310 Chronic myelomonocytic leukemia not having achieved remission: Secondary | ICD-10-CM

## 2020-12-27 DIAGNOSIS — Z79899 Other long term (current) drug therapy: Secondary | ICD-10-CM | POA: Diagnosis not present

## 2020-12-27 DIAGNOSIS — C9311 Chronic myelomonocytic leukemia, in remission: Secondary | ICD-10-CM | POA: Insufficient documentation

## 2020-12-27 DIAGNOSIS — Z5111 Encounter for antineoplastic chemotherapy: Secondary | ICD-10-CM | POA: Insufficient documentation

## 2020-12-27 LAB — COMPREHENSIVE METABOLIC PANEL
ALT: 13 U/L (ref 0–44)
AST: 17 U/L (ref 15–41)
Albumin: 4.1 g/dL (ref 3.5–5.0)
Alkaline Phosphatase: 76 U/L (ref 38–126)
Anion gap: 7 (ref 5–15)
BUN: 9 mg/dL (ref 8–23)
CO2: 26 mmol/L (ref 22–32)
Calcium: 8.7 mg/dL — ABNORMAL LOW (ref 8.9–10.3)
Chloride: 100 mmol/L (ref 98–111)
Creatinine, Ser: 0.79 mg/dL (ref 0.61–1.24)
GFR, Estimated: >60 mL/min (ref >60)
Glucose, Bld: 107 mg/dL — ABNORMAL HIGH (ref 70–99)
Potassium: 3.4 mmol/L — ABNORMAL LOW (ref 3.5–5.1)
Sodium: 133 mmol/L — ABNORMAL LOW (ref 135–145)
Total Bilirubin: 0.7 mg/dL (ref 0.3–1.2)
Total Protein: 7.9 g/dL (ref 6.5–8.1)

## 2020-12-27 LAB — CBC WITH DIFFERENTIAL/PLATELET
Abs Immature Granulocytes: 0.37 10*3/uL — ABNORMAL HIGH (ref 0.00–0.07)
Basophils Absolute: 0.2 10*3/uL — ABNORMAL HIGH (ref 0.0–0.1)
Basophils Relative: 1 %
Eosinophils Absolute: 0.5 10*3/uL (ref 0.0–0.5)
Eosinophils Relative: 3 %
HCT: 44.8 % (ref 39.0–52.0)
Hemoglobin: 14.5 g/dL (ref 13.0–17.0)
Immature Granulocytes: 3 %
Lymphocytes Relative: 17 %
Lymphs Abs: 2.4 10*3/uL (ref 0.7–4.0)
MCH: 27.9 pg (ref 26.0–34.0)
MCHC: 32.4 g/dL (ref 30.0–36.0)
MCV: 86.2 fL (ref 80.0–100.0)
Monocytes Absolute: 1.2 10*3/uL — ABNORMAL HIGH (ref 0.1–1.0)
Monocytes Relative: 8 %
Neutro Abs: 9.7 10*3/uL — ABNORMAL HIGH (ref 1.7–7.7)
Neutrophils Relative %: 68 %
Platelets: 472 10*3/uL — ABNORMAL HIGH (ref 150–400)
RBC: 5.2 MIL/uL (ref 4.22–5.81)
RDW: 17.9 % — ABNORMAL HIGH (ref 11.5–15.5)
Smear Review: INCREASED
WBC: 14.3 10*3/uL — ABNORMAL HIGH (ref 4.0–10.5)
nRBC: 0.1 % (ref 0.0–0.2)

## 2020-12-27 MED ORDER — ONDANSETRON HCL 4 MG PO TABS
8.0000 mg | ORAL_TABLET | Freq: Once | ORAL | Status: AC
  Administered 2020-12-27: 8 mg via ORAL
  Filled 2020-12-27: qty 2

## 2020-12-27 MED ORDER — AZACITIDINE CHEMO SQ INJECTION
50.0000 mg/m2 | Freq: Once | INTRAMUSCULAR | Status: AC
  Administered 2020-12-27: 132.5 mg via SUBCUTANEOUS
  Filled 2020-12-27: qty 5.3

## 2020-12-28 ENCOUNTER — Other Ambulatory Visit: Payer: Self-pay

## 2020-12-28 ENCOUNTER — Inpatient Hospital Stay: Payer: Medicare Other | Attending: Oncology | Admitting: Oncology

## 2020-12-28 ENCOUNTER — Inpatient Hospital Stay: Payer: Medicare Other

## 2020-12-28 VITALS — BP 116/57 | HR 84 | Temp 97.8°F | Resp 16 | Ht 74.5 in | Wt 319.0 lb

## 2020-12-28 DIAGNOSIS — C9310 Chronic myelomonocytic leukemia not having achieved remission: Secondary | ICD-10-CM

## 2020-12-28 DIAGNOSIS — Z5111 Encounter for antineoplastic chemotherapy: Secondary | ICD-10-CM

## 2020-12-28 DIAGNOSIS — C9311 Chronic myelomonocytic leukemia, in remission: Secondary | ICD-10-CM | POA: Diagnosis not present

## 2020-12-28 DIAGNOSIS — Z79899 Other long term (current) drug therapy: Secondary | ICD-10-CM | POA: Diagnosis not present

## 2020-12-28 MED ORDER — FUROSEMIDE 20 MG PO TABS: 20.0000 mg | ORAL_TABLET | Freq: Two times a day (BID) | ORAL | 0 refills | Status: DC

## 2020-12-28 MED ORDER — ONDANSETRON HCL 4 MG PO TABS
8.0000 mg | ORAL_TABLET | Freq: Once | ORAL | Status: AC
  Administered 2020-12-28: 8 mg via ORAL
  Filled 2020-12-28: qty 2

## 2020-12-28 MED ORDER — AZACITIDINE CHEMO SQ INJECTION
50.0000 mg/m2 | Freq: Once | INTRAMUSCULAR | Status: AC
  Administered 2020-12-28: 132.5 mg via SUBCUTANEOUS
  Filled 2020-12-28: qty 5.3

## 2020-12-28 NOTE — Progress Notes (Signed)
Hematology/Oncology Consult note [facility]  [phone] [phone]  Patient Care Team: Lavera Guise, MD as PCP - General (Internal Medicine) Sindy Guadeloupe, MD as Consulting Physician (Oncology) Edythe Clarity, [facility] as Pharmacist (Pharmacist)   Name of the patient: Samuel Frank  [id number]  10-19-1940   Date of visit: 12/28/20  Diagnosis-CMML 1  Chief complaint/ Reason for visit-on treatment assessment prior to next cycle of Vidaza  Heme/Onc history: Patient is a 80 yr old male with no significant co morbidities other than hypertension.  He has recently been having bilateral knee pain and has undergone steroid injection in his knee.Patient was noted to have a high white count on his routine exam.  His white count was 19.2 on 07/22/2018 with an H&H of 11.4/35.1 and a platelet count of 470.  At that point he was prescribed a course of antibiotics and a repeat blood work on 08/21/2018 showed white count of 42.3, H&H of 7.1/23 with an MCV of 84 and a platelet count of 791 and hence the patient was referred to hematology for further management.     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  In addition the core biopsy showed a small focus of fibrosis containing scattering of mast cells and eosinophils most suggestive of systemic mastocytosis.bone marrow blasts 7%.  Flow cytometry showed increased number of monocytic cells representing 16% of all cells with expression for HLA-DR, CD11b, CD13, CD14, CD33, CD38, CD56 and CD56 and CD64 with [facility] CD 117 are CD34. normal cytogenetics, CBL, SRSF2, SH2B3 and TET2    Repeat bone marrow biopsy after 4 cycles of Vidaza showed less pronounced monocytic/granulocytic proliferation and overall better granulopoiesis and no increase in blasts.  In addition features of systemic mastocytosis not present.   After 9 cycles of Vidaza patient was noted to have  significant anemia and worsening thrombocytosis for which a repeat bone marrow biopsy was done.  Bone marrow did not show any features of progression of CMML.  Overall stable findings and no increased blast.He was found to have iron deficiency and received 2 doses of Feraheme.   Patient seen by [facility] and underwent EGD and colonoscopy for iron deficiency anemia.Colonoscopy showed nonbleeding internal hemorrhoids.  Normal mucosa in the colon.  No polyps EGD showed normal esophagus and gastric mucosal atrophy.  No stigmata of bleeding  Interval history-currently reports bilateral lower extremity swelling which has been worse in the last couple of weeks.  He has had waxing and waning swelling in the past as well.  Denies other complaints at this time  ECOG PS- 1 Pain scale- 0   Review of systems- Review of Systems  Constitutional:  Negative for chills, fever, malaise/fatigue and weight loss.  HENT:  Negative for congestion, ear discharge and nosebleeds.   Eyes:  Negative for blurred vision.  Respiratory:  Negative for cough, hemoptysis, sputum production, shortness of breath and wheezing.   Cardiovascular:  Negative for chest pain, palpitations, orthopnea and claudication.  Gastrointestinal:  Negative for abdominal pain, blood in stool, constipation, diarrhea, heartburn, melena, nausea and vomiting.  Genitourinary:  Negative for dysuria, flank pain, frequency, hematuria and urgency.  Musculoskeletal:  Negative for back pain, joint pain and myalgias.  Skin:  Negative for rash.  Neurological:  Negative for dizziness, tingling, focal weakness, seizures, weakness and headaches.  Endo/Heme/Allergies:  Does not bruise/bleed easily.  Psychiatric/Behavioral:  Negative for depression and suicidal ideas. The patient  does not have insomnia.      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[facility])    CMML (chronic myelomonocytic  leukemia) ([place])    Dyspnea    Hypertension      Past Surgical History:  Procedure Laterality Date   COLONOSCOPY N/A 08/28/2019   Procedure: COLONOSCOPY;  Surgeon: Toledo, Benay Pike, MD;  Location: [facility] ENDOSCOPY;  Service: Gastroenterology;  Laterality: N/A;   COLONOSCOPY WITH PROPOFOL N/A 05/03/2015   Procedure: COLONOSCOPY WITH PROPOFOL;  Surgeon: [name], MD;  Location: [facility] ENDOSCOPY;  Service: Gastroenterology;  Laterality: N/A;   ESOPHAGOGASTRODUODENOSCOPY N/A 08/28/2019   Procedure: ESOPHAGOGASTRODUODENOSCOPY (EGD);  Surgeon: Toledo, Benay Pike, MD;  Location: [facility] ENDOSCOPY;  Service: Gastroenterology;  Laterality: N/A;   ESOPHAGOGASTRODUODENOSCOPY (EGD) WITH PROPOFOL N/A 05/03/2015   Procedure: ESOPHAGOGASTRODUODENOSCOPY (EGD) WITH PROPOFOL;  Surgeon: [name], MD;  Location: [facility] ENDOSCOPY;  Service: Gastroenterology;  Laterality: N/A;   HERNIA REPAIR     1975    Social History   Socioeconomic History   Marital status: Divorced    Spouse name: Not on file   Number of children: 4   Years of education: Not on file   Highest education level: Not on file  Occupational History   Occupation: retired    Comment: [employer]  Tobacco Use   Smoking status: Never   Smokeless tobacco: Never  Vaping Use   Vaping Use: Never used  Substance and Sexual Activity   Alcohol use: No   Drug use: No   Sexual activity: Not Currently  Other Topics Concern   Not on file  Social History Narrative   Not on file   Social Determinants of Health   Financial Resource Strain: Low Risk    Difficulty of Paying Living Expenses: Not very hard  Food Insecurity: Not on file  Transportation Needs: Not on file  Physical Activity: Not on file  Stress: Not on file  Social Connections: Not on file  Intimate Partner Violence: Not on file    Family History  Problem Relation Age of Onset   Alzheimer's disease Father    Lung cancer Sister    Cancer Sister      Current  Outpatient Medications:    allopurinol (ZYLOPRIM) 300 MG tablet, Take 1 tablet (300 mg total) by mouth daily., Disp: 90 tablet, Rfl: 2   aspirin EC 81 MG tablet, Take 81 mg by mouth daily., Disp: , Rfl:    furosemide (LASIX) 20 MG tablet, Take 1 tablet (20 mg total) by mouth daily., Disp: 5 tablet, Rfl: 0   hydrochlorothiazide (MICROZIDE) 12.5 MG capsule, TAKE 1 CAPSULE BY MOUTH EVERY DAY, Disp: 30 capsule, Rfl: 5   pantoprazole (PROTONIX) 40 MG tablet, Take 1 tablet (40 mg total) by mouth daily., Disp: 90 tablet, Rfl: 0   potassium chloride SA (KLOR-CON M20) 20 MEQ tablet, Take 1 tablet (20 mEq total) by mouth 2 (two) times daily., Disp: 180 tablet, Rfl: 1   traZODone (DESYREL) 50 MG tablet, Take 1 tablet (50 mg total) by mouth at bedtime., Disp: 30 tablet, Rfl: 1   triamcinolone ointment (KENALOG) 0.5 %, APPLY 1 APPLICATION TOPICALLY 2 TIMES DAILY. TO THE AFFECTED AREAS OF FEET AND LEFT SIDE OF STOMACH, Disp: 30 g, Rfl: 0   valACYclovir (VALTREX) 500 MG tablet, TAKE 1 TABLET BY MOUTH EVERY DAY, Disp: 90 tablet, Rfl: 1   Tdap (BOOSTRIX) 5-2.5-18.5 LF-MCG/0.5  injection, Inject into the muscle once. (Patient not taking: Reported on 12/28/2020), Disp: , Rfl:    Zoster Vaccine Adjuvanted ([place]) injection, Inject 0.5 mLs into the muscle once. (Patient not taking: Reported on 12/28/2020), Disp: , Rfl:   Physical exam:  Vitals:   12/28/20 0953  BP: (!) 116/57  Pulse: 84  Resp: 16  Temp: 97.8 F (36.6 C)  TempSrc: Oral  Weight: (!) 319 lb (144.7 kg)  Height: 6' 2.5" (1.892 m)   Physical Exam Cardiovascular:     Rate and Rhythm: Normal rate and regular rhythm.     Heart sounds: Normal heart sounds.  Pulmonary:     Effort: Pulmonary effort is normal.     Breath sounds: Normal breath sounds.  Musculoskeletal:     Right lower leg: Edema present.     Left lower leg: Edema present.  Skin:    General: Skin is warm and dry.  Neurological:     Mental Status: He is alert and oriented to  person, place, and time.     CMP Latest Ref Rng & Units 12/27/2020  Glucose 70 - 99 mg/dL 107(H)  BUN 8 - 23 mg/dL 9  Creatinine 0.61 - 1.24 mg/dL 0.79  Sodium 135 - 145 mmol/L 133(L)  Potassium 3.5 - 5.1 mmol/L 3.4(L)  Chloride 98 - 111 mmol/L 100  CO2 22 - 32 mmol/L 26  Calcium 8.9 - 10.3 mg/dL 8.7(L)  Total Protein 6.5 - 8.1 g/dL 7.9  Total Bilirubin 0.3 - 1.2 mg/dL 0.7  Alkaline Phos 38 - 126 U/L 76  AST 15 - 41 U/L 17  ALT 0 - 44 U/L 13   CBC Latest Ref Rng & Units 12/27/2020  WBC 4.0 - 10.5 K/uL 14.3(H)  Hemoglobin 13.0 - 17.0 g/dL 14.5  Hematocrit 39.0 - 52.0 % 44.8  Platelets 150 - 400 K/uL 472(H)     Assessment and plan- Patient is a 80 y.o. male with CMML 1 here for on treatment assessment prior to next cycle of Vidaza  Counts okay to proceed with next cycle of Vidaza today which she will be receiving throughout the week.  4 weeks from today Thanksgiving weekend and therefore I will see him back in 5 weeks for cycle 30 of Vidaza.  Overall CML has responded well to [place].  Hemoglobin is currently normal.  He has mild leukocytosis and thrombocytosis with essentially stable  Bilateral lower extremity edema: I will asked him to increase his Lasix to twice daily dosing for the next 1 week.   Visit Diagnosis 1. Encounter for antineoplastic chemotherapy   2. 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 Randa Evens, MD, MPH [facility] at [facility] [phone] 12/28/2020 1:15 PM

## 2020-12-28 NOTE — Patient Instructions (Signed)
[facility]  Discharge Instructions: Thank you for choosing [facility] to provide your oncology and hematology care.  If you have a lab appointment with the [facility], please go directly to the [facility] and check in at the registration area.  Wear comfortable clothing and clothing appropriate for easy access to any Portacath or PICC line.   We strive to give you quality time with your provider. You may need to reschedule your appointment if you arrive late (15 or more minutes).  Arriving late affects you and other patients whose appointments are after yours.  Also, if you miss three or more appointments without notifying the office, you may be dismissed from the clinic at the provider's discretion.      For prescription refill requests, have your pharmacy contact our office and allow 72 hours for refills to be completed.    Today you received the following chemotherapy and/or immunotherapy agents Vidaza    To help prevent nausea and vomiting after your treatment, we encourage you to take your nausea medication as directed.  BELOW ARE SYMPTOMS THAT SHOULD BE REPORTED IMMEDIATELY: *FEVER GREATER THAN 100.4 F (38 C) OR HIGHER *CHILLS OR SWEATING *NAUSEA AND VOMITING THAT IS NOT CONTROLLED WITH YOUR NAUSEA MEDICATION *UNUSUAL SHORTNESS OF BREATH *UNUSUAL BRUISING OR BLEEDING *URINARY PROBLEMS (pain or burning when urinating, or frequent urination) *BOWEL PROBLEMS (unusual diarrhea, constipation, pain near the anus) TENDERNESS IN MOUTH AND THROAT WITH OR WITHOUT PRESENCE OF ULCERS (sore throat, sores in mouth, or a toothache) UNUSUAL RASH, SWELLING OR PAIN  UNUSUAL VAGINAL DISCHARGE OR ITCHING   Items with * indicate a potential emergency and should be followed up as soon as possible or go to the Emergency Department if any problems should occur.  Please show the CHEMOTHERAPY ALERT CARD or IMMUNOTHERAPY ALERT CARD at check-in to the  Emergency Department and triage nurse.  Should you have questions after your visit or need to cancel or reschedule your appointment, please contact [facility]  [phone] and follow the prompts.  Office hours are 8:00 a.m. to 4:30 p.m. Monday - Friday. Please note that voicemails left after 4:00 p.m. may not be returned until the following business day.  We are closed weekends and major holidays. You have access to a nurse at all times for urgent questions. Please call the main number to the clinic [phone] and follow the prompts.  For any non-urgent questions, you may also contact your provider using MyChart. We now offer e-Visits for anyone 23 and older to request care online for non-urgent symptoms. For details visit [email or web address].   Also download the MyChart app! Go to the app store, search "MyChart", open the app, select [facility], and log in with your MyChart username and password.  Due to Covid, a mask is required upon entering the hospital/clinic. If you do not have a mask, one will be given to you upon arrival. For doctor visits, patients may have 1 support person aged 50 or older with them. For treatment visits, patients cannot have anyone with them due to current Covid guidelines and our immunocompromised population.

## 2020-12-28 NOTE — Progress Notes (Signed)
Pt states his leg edema has gotten worse. He is on lasix and HCTZ.

## 2020-12-29 ENCOUNTER — Other Ambulatory Visit: Payer: Self-pay

## 2020-12-29 ENCOUNTER — Inpatient Hospital Stay: Payer: Medicare Other

## 2020-12-29 VITALS — BP 120/64 | HR 64 | Temp 97.0°F | Resp 19

## 2020-12-29 DIAGNOSIS — C9311 Chronic myelomonocytic leukemia, in remission: Secondary | ICD-10-CM | POA: Diagnosis not present

## 2020-12-29 DIAGNOSIS — C9310 Chronic myelomonocytic leukemia not having achieved remission: Secondary | ICD-10-CM

## 2020-12-29 DIAGNOSIS — Z5111 Encounter for antineoplastic chemotherapy: Secondary | ICD-10-CM | POA: Diagnosis not present

## 2020-12-29 DIAGNOSIS — Z79899 Other long term (current) drug therapy: Secondary | ICD-10-CM | POA: Diagnosis not present

## 2020-12-29 MED ORDER — ONDANSETRON HCL 4 MG PO TABS
8.0000 mg | ORAL_TABLET | Freq: Once | ORAL | Status: AC
  Administered 2020-12-29: 8 mg via ORAL
  Filled 2020-12-29: qty 2

## 2020-12-29 MED ORDER — AZACITIDINE CHEMO SQ INJECTION
50.0000 mg/m2 | Freq: Once | INTRAMUSCULAR | Status: AC
  Administered 2020-12-29: 132.5 mg via SUBCUTANEOUS
  Filled 2020-12-29: qty 5.3

## 2020-12-30 ENCOUNTER — Inpatient Hospital Stay: Payer: Medicare Other

## 2020-12-30 VITALS — BP 120/76 | HR 68 | Temp 97.3°F | Resp 18

## 2020-12-30 DIAGNOSIS — C9310 Chronic myelomonocytic leukemia not having achieved remission: Secondary | ICD-10-CM

## 2020-12-30 DIAGNOSIS — Z5111 Encounter for antineoplastic chemotherapy: Secondary | ICD-10-CM | POA: Diagnosis not present

## 2020-12-30 DIAGNOSIS — C9311 Chronic myelomonocytic leukemia, in remission: Secondary | ICD-10-CM | POA: Diagnosis not present

## 2020-12-30 DIAGNOSIS — Z79899 Other long term (current) drug therapy: Secondary | ICD-10-CM | POA: Diagnosis not present

## 2020-12-30 MED ORDER — ONDANSETRON HCL 4 MG PO TABS
8.0000 mg | ORAL_TABLET | Freq: Once | ORAL | Status: AC
  Administered 2020-12-30: 8 mg via ORAL
  Filled 2020-12-30: qty 2

## 2020-12-30 MED ORDER — AZACITIDINE CHEMO SQ INJECTION
50.0000 mg/m2 | Freq: Once | INTRAMUSCULAR | Status: AC
  Administered 2020-12-30: 132.5 mg via SUBCUTANEOUS
  Filled 2020-12-30: qty 5.3

## 2020-12-31 ENCOUNTER — Other Ambulatory Visit: Payer: Self-pay

## 2020-12-31 ENCOUNTER — Inpatient Hospital Stay: Payer: Medicare Other

## 2020-12-31 VITALS — BP 122/68 | HR 93 | Temp 98.1°F

## 2020-12-31 DIAGNOSIS — Z79899 Other long term (current) drug therapy: Secondary | ICD-10-CM | POA: Diagnosis not present

## 2020-12-31 DIAGNOSIS — C9311 Chronic myelomonocytic leukemia, in remission: Secondary | ICD-10-CM | POA: Diagnosis not present

## 2020-12-31 DIAGNOSIS — Z5111 Encounter for antineoplastic chemotherapy: Secondary | ICD-10-CM | POA: Diagnosis not present

## 2020-12-31 DIAGNOSIS — C9310 Chronic myelomonocytic leukemia not having achieved remission: Secondary | ICD-10-CM

## 2020-12-31 MED ORDER — AZACITIDINE CHEMO SQ INJECTION
50.0000 mg/m2 | Freq: Once | INTRAMUSCULAR | Status: AC
  Administered 2020-12-31: 132.5 mg via SUBCUTANEOUS
  Filled 2020-12-31: qty 5.3

## 2020-12-31 MED ORDER — ONDANSETRON HCL 4 MG PO TABS
8.0000 mg | ORAL_TABLET | Freq: Once | ORAL | Status: AC
  Administered 2020-12-31: 8 mg via ORAL
  Filled 2020-12-31: qty 2

## 2020-12-31 NOTE — Patient Instructions (Signed)
[facility]  Discharge Instructions: Thank you for choosing [facility] to provide your oncology and hematology care.  If you have a lab appointment with the [facility], please go directly to the [facility] and check in at the registration area.  Wear comfortable clothing and clothing appropriate for easy access to any Portacath or PICC line.   We strive to give you quality time with your provider. You may need to reschedule your appointment if you arrive late (15 or more minutes).  Arriving late affects you and other patients whose appointments are after yours.  Also, if you miss three or more appointments without notifying the office, you may be dismissed from the clinic at the provider's discretion.      For prescription refill requests, have your pharmacy contact our office and allow 72 hours for refills to be completed.    Today you received the following chemotherapy and/or immunotherapy agents: Vidaza      To help prevent nausea and vomiting after your treatment, we encourage you to take your nausea medication as directed.  BELOW ARE SYMPTOMS THAT SHOULD BE REPORTED IMMEDIATELY: *FEVER GREATER THAN 100.4 F (38 C) OR HIGHER *CHILLS OR SWEATING *NAUSEA AND VOMITING THAT IS NOT CONTROLLED WITH YOUR NAUSEA MEDICATION *UNUSUAL SHORTNESS OF BREATH *UNUSUAL BRUISING OR BLEEDING *URINARY PROBLEMS (pain or burning when urinating, or frequent urination) *BOWEL PROBLEMS (unusual diarrhea, constipation, pain near the anus) TENDERNESS IN MOUTH AND THROAT WITH OR WITHOUT PRESENCE OF ULCERS (sore throat, sores in mouth, or a toothache) UNUSUAL RASH, SWELLING OR PAIN  UNUSUAL VAGINAL DISCHARGE OR ITCHING   Items with * indicate a potential emergency and should be followed up as soon as possible or go to the Emergency Department if any problems should occur.  Please show the CHEMOTHERAPY ALERT CARD or IMMUNOTHERAPY ALERT CARD at check-in to  the Emergency Department and triage nurse.  Should you have questions after your visit or need to cancel or reschedule your appointment, please contact [facility]  [phone] and follow the prompts.  Office hours are 8:00 a.m. to 4:30 p.m. Monday - Friday. Please note that voicemails left after 4:00 p.m. may not be returned until the following business day.  We are closed weekends and major holidays. You have access to a nurse at all times for urgent questions. Please call the main number to the clinic [phone] and follow the prompts.  For any non-urgent questions, you may also contact your provider using MyChart. We now offer e-Visits for anyone 70 and older to request care online for non-urgent symptoms. For details visit [email or web address].   Also download the MyChart app! Go to the app store, search "MyChart", open the app, select [facility], and log in with your MyChart username and password.  Due to Covid, a mask is required upon entering the hospital/clinic. If you do not have a mask, one will be given to you upon arrival. For doctor visits, patients may have 1 support person aged 63 or older with them. For treatment visits, patients cannot have anyone with them due to current Covid guidelines and our immunocompromised population. Azacitidine suspension for injection (subcutaneous use) What is this medication? AZACITIDINE (ay Bonifay) is a chemotherapy drug. This medicine reduces the growth of cancer cells and can suppress the immune system. It is used for treating myelodysplastic syndrome or some types of leukemia. This medicine may be used for other purposes; ask your health  care provider or pharmacist if you have questions. COMMON BRAND NAME(S): Vidaza What should I tell my care team before I take this medication? They need to know if you have any of these conditions: kidney disease liver disease liver tumors an unusual or allergic  reaction to azacitidine, mannitol, other medicines, foods, dyes, or preservatives pregnant or trying to get pregnant breast-feeding How should I use this medication? This medicine is for injection under the skin. It is administered in a hospital or clinic by a specially trained health care professional. Talk to your pediatrician regarding the use of this medicine in children. While this drug may be prescribed for selected conditions, precautions do apply. Overdosage: If you think you have taken too much of this medicine contact a poison control center or emergency room at once. NOTE: This medicine is only for you. Do not share this medicine with others. What if I miss a dose? It is important not to miss your dose. Call your doctor or health care professional if you are unable to keep an appointment. What may interact with this medication? Interactions have not been studied. Give your health care provider a list of all the medicines, herbs, non-prescription drugs, or dietary supplements you use. Also tell them if you smoke, drink alcohol, or use illegal drugs. Some items may interact with your medicine. This list may not describe all possible interactions. Give your health care provider a list of all the medicines, herbs, non-prescription drugs, or dietary supplements you use. Also tell them if you smoke, drink alcohol, or use illegal drugs. Some items may interact with your medicine. What should I watch for while using this medication? Visit your doctor for checks on your progress. This drug may make you feel generally unwell. This is not uncommon, as chemotherapy can affect healthy cells as well as cancer cells. Report any side effects. Continue your course of treatment even though you feel ill unless your doctor tells you to stop. In some cases, you may be given additional medicines to help with side effects. Follow all directions for their use. Call your doctor or health care professional for  advice if you get a fever, chills or sore throat, or other symptoms of a cold or flu. Do not treat yourself. This drug decreases your body's ability to fight infections. Try to avoid being around people who are sick. This medicine may increase your risk to bruise or bleed. Call your doctor or health care professional if you notice any unusual bleeding. You may need blood work done while you are taking this medicine. Do not become pregnant while taking this medicine and for 6 months after the last dose. Women should inform their doctor if they wish to become pregnant or think they might be pregnant. Men should not father a child while taking this medicine and for 3 months after the last dose. There is a potential for serious side effects to an unborn child. Talk to your health care professional or pharmacist for more information. Do not breast-feed an infant while taking this medicine and for 1 week after the last dose. This medicine may interfere with the ability to have a child. Talk with your doctor or health care professional if you are concerned about your fertility. What side effects may I notice from receiving this medication? Side effects that you should report to your doctor or health care professional as soon as possible: allergic reactions like skin rash, itching or hives, swelling of the face, lips, or tongue  low blood counts - this medicine may decrease the number of white blood cells, red blood cells and platelets. You may be at increased risk for infections and bleeding. signs of infection - fever or chills, cough, sore throat, pain passing urine signs of decreased platelets or bleeding - bruising, pinpoint red spots on the skin, black, tarry stools, blood in the urine signs of decreased red blood cells - unusually weak or tired, fainting spells, lightheadedness signs and symptoms of kidney injury like trouble passing urine or change in the amount of urine signs and symptoms of liver  injury like dark yellow or brown urine; general ill feeling or flu-like symptoms; light-colored stools; loss of appetite; nausea; right upper belly pain; unusually weak or tired; yellowing of the eyes or skin Side effects that usually do not require medical attention (report to your doctor or health care professional if they continue or are bothersome): constipation diarrhea nausea, vomiting pain or redness at the injection site unusually weak or tired This list may not describe all possible side effects. Call your doctor for medical advice about side effects. You may report side effects to FDA at 1-800-FDA-1088. Where should I keep my medication? This drug is given in a hospital or clinic and will not be stored at home. NOTE: This sheet is a summary. It may not cover all possible information. If you have questions about this medicine, talk to your doctor, pharmacist, or health care provider.  2022 [employer] (2016-03-21 14:37:51)

## 2021-01-03 DIAGNOSIS — G8929 Other chronic pain: Secondary | ICD-10-CM | POA: Diagnosis not present

## 2021-01-03 DIAGNOSIS — M5136 Other intervertebral disc degeneration, lumbar region: Secondary | ICD-10-CM | POA: Diagnosis not present

## 2021-01-03 DIAGNOSIS — I700 Atherosclerosis of aorta: Secondary | ICD-10-CM | POA: Diagnosis not present

## 2021-01-03 DIAGNOSIS — M5412 Radiculopathy, cervical region: Secondary | ICD-10-CM | POA: Diagnosis not present

## 2021-01-03 DIAGNOSIS — M25511 Pain in right shoulder: Secondary | ICD-10-CM | POA: Diagnosis not present

## 2021-01-03 DIAGNOSIS — M5441 Lumbago with sciatica, right side: Secondary | ICD-10-CM | POA: Diagnosis not present

## 2021-01-05 ENCOUNTER — Other Ambulatory Visit: Payer: Self-pay

## 2021-01-05 DIAGNOSIS — K219 Gastro-esophageal reflux disease without esophagitis: Secondary | ICD-10-CM

## 2021-01-05 MED ORDER — PANTOPRAZOLE SODIUM 40 MG PO TBEC: 40.0000 mg | DELAYED_RELEASE_TABLET | Freq: Every day | ORAL | 0 refills | Status: DC

## 2021-01-24 ENCOUNTER — Telehealth: Payer: Self-pay | Admitting: Oncology

## 2021-01-24 DIAGNOSIS — M79675 Pain in left toe(s): Secondary | ICD-10-CM | POA: Diagnosis not present

## 2021-01-24 DIAGNOSIS — B351 Tinea unguium: Secondary | ICD-10-CM | POA: Diagnosis not present

## 2021-01-24 DIAGNOSIS — I83029 Varicose veins of left lower extremity with ulcer of unspecified site: Secondary | ICD-10-CM | POA: Diagnosis not present

## 2021-01-24 DIAGNOSIS — I87303 Chronic venous hypertension (idiopathic) without complications of bilateral lower extremity: Secondary | ICD-10-CM | POA: Diagnosis not present

## 2021-01-24 DIAGNOSIS — C9310 Chronic myelomonocytic leukemia not having achieved remission: Secondary | ICD-10-CM | POA: Diagnosis not present

## 2021-01-24 DIAGNOSIS — M79674 Pain in right toe(s): Secondary | ICD-10-CM | POA: Diagnosis not present

## 2021-01-24 DIAGNOSIS — L97929 Non-pressure chronic ulcer of unspecified part of left lower leg with unspecified severity: Secondary | ICD-10-CM | POA: Diagnosis not present

## 2021-01-24 NOTE — Telephone Encounter (Signed)
Pt called and wants to see if he can get a morning appt. Call back number is [phone]

## 2021-01-24 NOTE — Telephone Encounter (Signed)
I called the pt and got his voicemail and left message that with so many chemo treatments being long, we can't make the appts for your infusion because of how short it is has to stay in afternoon

## 2021-01-29 ENCOUNTER — Encounter
Admit: 2021-01-29 | Discharge: 2021-02-03 | Disposition: A | Discharge status: Home / Self Care | Payer: MEDICARE | Source: Other Acute Inpatient Hospital | Attending: Registered Nurse

## 2021-01-29 ENCOUNTER — Ambulatory Visit
Admit: 2021-01-29 | Discharge: 2021-02-03 | Disposition: A | Discharge status: Home / Self Care | Payer: MEDICARE | Source: Other Acute Inpatient Hospital

## 2021-01-29 ENCOUNTER — Ambulatory Visit: Admit: 2021-01-29 | Discharge: 2021-02-03 | Payer: MEDICARE

## 2021-01-29 ENCOUNTER — Encounter: Admit: 2021-01-29 | Discharge: 2021-02-03 | Payer: MEDICARE | Attending: Hematology

## 2021-01-29 ENCOUNTER — Encounter
Admit: 2021-01-29 | Discharge: 2021-02-03 | Disposition: A | Discharge status: Home / Self Care | Payer: MEDICARE | Source: Other Acute Inpatient Hospital | Attending: Hematology

## 2021-01-29 ENCOUNTER — Emergency Department: Payer: Medicare Other

## 2021-01-29 ENCOUNTER — Inpatient Hospital Stay
Admission: EM | Admit: 2021-01-29 | Discharge: 2021-01-29 | DRG: 811 | Disposition: A | Discharge status: Other Acute Inpatient Hospital | Payer: Medicare Other | Attending: Internal Medicine | Admitting: Internal Medicine

## 2021-01-29 DIAGNOSIS — K5900 Constipation, unspecified: Secondary | ICD-10-CM | POA: Diagnosis present

## 2021-01-29 DIAGNOSIS — C9310 Chronic myelomonocytic leukemia not having achieved remission: Secondary | ICD-10-CM | POA: Diagnosis present

## 2021-01-29 DIAGNOSIS — I2510 Atherosclerotic heart disease of native coronary artery without angina pectoris: Secondary | ICD-10-CM | POA: Diagnosis not present

## 2021-01-29 DIAGNOSIS — Z801 Family history of malignant neoplasm of trachea, bronchus and lung: Secondary | ICD-10-CM | POA: Diagnosis not present

## 2021-01-29 DIAGNOSIS — C9200 Acute myeloblastic leukemia, not having achieved remission: Secondary | ICD-10-CM | POA: Diagnosis not present

## 2021-01-29 DIAGNOSIS — R6889 Other general symptoms and signs: Secondary | ICD-10-CM | POA: Diagnosis not present

## 2021-01-29 DIAGNOSIS — N179 Acute kidney failure, unspecified: Secondary | ICD-10-CM | POA: Diagnosis not present

## 2021-01-29 DIAGNOSIS — I890 Lymphedema, not elsewhere classified: Secondary | ICD-10-CM | POA: Diagnosis not present

## 2021-01-29 DIAGNOSIS — I10 Essential (primary) hypertension: Secondary | ICD-10-CM | POA: Diagnosis present

## 2021-01-29 DIAGNOSIS — D84821 Immunodeficiency due to drugs: Secondary | ICD-10-CM | POA: Diagnosis not present

## 2021-01-29 DIAGNOSIS — K5520 Angiodysplasia of colon without hemorrhage: Secondary | ICD-10-CM | POA: Diagnosis not present

## 2021-01-29 DIAGNOSIS — K449 Diaphragmatic hernia without obstruction or gangrene: Secondary | ICD-10-CM | POA: Diagnosis present

## 2021-01-29 DIAGNOSIS — R739 Hyperglycemia, unspecified: Secondary | ICD-10-CM | POA: Diagnosis not present

## 2021-01-29 DIAGNOSIS — Z03821 Encounter for observation for suspected ingested foreign body ruled out: Secondary | ICD-10-CM | POA: Diagnosis not present

## 2021-01-29 DIAGNOSIS — M109 Gout, unspecified: Secondary | ICD-10-CM | POA: Diagnosis present

## 2021-01-29 DIAGNOSIS — K921 Melena: Secondary | ICD-10-CM | POA: Diagnosis present

## 2021-01-29 DIAGNOSIS — R7303 Prediabetes: Secondary | ICD-10-CM | POA: Diagnosis present

## 2021-01-29 DIAGNOSIS — K31819 Angiodysplasia of stomach and duodenum without bleeding: Secondary | ICD-10-CM | POA: Diagnosis not present

## 2021-01-29 DIAGNOSIS — R000 Tachycardia, unspecified: Secondary | ICD-10-CM | POA: Diagnosis not present

## 2021-01-29 DIAGNOSIS — I499 Cardiac arrhythmia, unspecified: Secondary | ICD-10-CM | POA: Diagnosis not present

## 2021-01-29 DIAGNOSIS — R0902 Hypoxemia: Secondary | ICD-10-CM | POA: Diagnosis present

## 2021-01-29 DIAGNOSIS — K3189 Other diseases of stomach and duodenum: Secondary | ICD-10-CM | POA: Diagnosis not present

## 2021-01-29 DIAGNOSIS — D509 Iron deficiency anemia, unspecified: Secondary | ICD-10-CM | POA: Diagnosis not present

## 2021-01-29 DIAGNOSIS — C9502 Acute leukemia of unspecified cell type, in relapse: Secondary | ICD-10-CM | POA: Diagnosis not present

## 2021-01-29 DIAGNOSIS — D72829 Elevated white blood cell count, unspecified: Secondary | ICD-10-CM | POA: Diagnosis not present

## 2021-01-29 DIAGNOSIS — R569 Unspecified convulsions: Secondary | ICD-10-CM | POA: Diagnosis present

## 2021-01-29 DIAGNOSIS — Z7982 Long term (current) use of aspirin: Secondary | ICD-10-CM

## 2021-01-29 DIAGNOSIS — K648 Other hemorrhoids: Secondary | ICD-10-CM | POA: Diagnosis not present

## 2021-01-29 DIAGNOSIS — I479 Paroxysmal tachycardia, unspecified: Secondary | ICD-10-CM | POA: Diagnosis not present

## 2021-01-29 DIAGNOSIS — Z20822 Contact with and (suspected) exposure to covid-19: Secondary | ICD-10-CM | POA: Diagnosis present

## 2021-01-29 DIAGNOSIS — Z79899 Other long term (current) drug therapy: Secondary | ICD-10-CM | POA: Diagnosis not present

## 2021-01-29 DIAGNOSIS — K31811 Angiodysplasia of stomach and duodenum with bleeding: Secondary | ICD-10-CM | POA: Diagnosis not present

## 2021-01-29 DIAGNOSIS — Z8711 Personal history of peptic ulcer disease: Secondary | ICD-10-CM | POA: Diagnosis not present

## 2021-01-29 DIAGNOSIS — R578 Other shock: Secondary | ICD-10-CM | POA: Diagnosis not present

## 2021-01-29 DIAGNOSIS — Z792 Long term (current) use of antibiotics: Secondary | ICD-10-CM | POA: Diagnosis not present

## 2021-01-29 DIAGNOSIS — Z5111 Encounter for antineoplastic chemotherapy: Secondary | ICD-10-CM | POA: Diagnosis not present

## 2021-01-29 DIAGNOSIS — D500 Iron deficiency anemia secondary to blood loss (chronic): Secondary | ICD-10-CM | POA: Diagnosis not present

## 2021-01-29 DIAGNOSIS — D62 Acute posthemorrhagic anemia: Secondary | ICD-10-CM | POA: Diagnosis not present

## 2021-01-29 DIAGNOSIS — K922 Gastrointestinal hemorrhage, unspecified: Secondary | ICD-10-CM | POA: Diagnosis not present

## 2021-01-29 DIAGNOSIS — R55 Syncope and collapse: Secondary | ICD-10-CM | POA: Diagnosis present

## 2021-01-29 DIAGNOSIS — Z9889 Other specified postprocedural states: Secondary | ICD-10-CM | POA: Diagnosis not present

## 2021-01-29 DIAGNOSIS — K625 Hemorrhage of anus and rectum: Secondary | ICD-10-CM | POA: Diagnosis not present

## 2021-01-29 DIAGNOSIS — Z8719 Personal history of other diseases of the digestive system: Secondary | ICD-10-CM | POA: Diagnosis not present

## 2021-01-29 DIAGNOSIS — R319 Hematuria, unspecified: Secondary | ICD-10-CM | POA: Diagnosis not present

## 2021-01-29 DIAGNOSIS — E861 Hypovolemia: Secondary | ICD-10-CM | POA: Diagnosis not present

## 2021-01-29 DIAGNOSIS — Z743 Need for continuous supervision: Secondary | ICD-10-CM | POA: Diagnosis not present

## 2021-01-29 DIAGNOSIS — Z856 Personal history of leukemia: Secondary | ICD-10-CM | POA: Diagnosis not present

## 2021-01-29 DIAGNOSIS — K5730 Diverticulosis of large intestine without perforation or abscess without bleeding: Secondary | ICD-10-CM | POA: Diagnosis not present

## 2021-01-29 DIAGNOSIS — I959 Hypotension, unspecified: Secondary | ICD-10-CM | POA: Diagnosis not present

## 2021-01-29 LAB — PROTIME-INR
INR: 1.3 — ABNORMAL HIGH (ref 0.8–1.2)
Prothrombin Time: 16 seconds — ABNORMAL HIGH (ref 11.4–15.2)

## 2021-01-29 LAB — BASIC METABOLIC PANEL
Anion gap: 12 (ref 5–15)
BUN: 13 mg/dL (ref 8–23)
CO2: 19 mmol/L — ABNORMAL LOW (ref 22–32)
Calcium: 8.1 mg/dL — ABNORMAL LOW (ref 8.9–10.3)
Chloride: 106 mmol/L (ref 98–111)
Creatinine, Ser: 1.52 mg/dL — ABNORMAL HIGH (ref 0.61–1.24)
GFR, Estimated: 46 mL/min — ABNORMAL LOW (ref >60)
Glucose, Bld: 254 mg/dL — ABNORMAL HIGH (ref 70–99)
Potassium: 3.4 mmol/L — ABNORMAL LOW (ref 3.5–5.1)
Sodium: 137 mmol/L (ref 135–145)

## 2021-01-29 LAB — RESP PANEL BY RT-PCR (FLU A&B, COVID) ARPGX2
Influenza A by PCR: NEGATIVE
Influenza B by PCR: NEGATIVE
SARS Coronavirus 2 by RT PCR: NEGATIVE

## 2021-01-29 LAB — CBC WITH DIFFERENTIAL/PLATELET
Abs Immature Granulocytes: 25.9 10*3/uL — ABNORMAL HIGH (ref 0.00–0.07)
Band Neutrophils: 2 %
Basophils Absolute: 0 10*3/uL (ref 0.0–0.1)
Basophils Relative: 0 %
Blasts: 10 %
Eosinophils Absolute: 1.4 10*3/uL — ABNORMAL HIGH (ref 0.0–0.5)
Eosinophils Relative: 1 %
HCT: 18.2 % — ABNORMAL LOW (ref 39.0–52.0)
Hemoglobin: 5.4 g/dL — ABNORMAL LOW (ref 13.0–17.0)
Lymphocytes Relative: 8 %
Lymphs Abs: 10.9 10*3/uL — ABNORMAL HIGH (ref 0.7–4.0)
MCH: 26 pg (ref 26.0–34.0)
MCHC: 29.7 g/dL — ABNORMAL LOW (ref 30.0–36.0)
MCV: 87.5 fL (ref 80.0–100.0)
Metamyelocytes Relative: 9 %
Monocytes Absolute: 2.7 10*3/uL — ABNORMAL HIGH (ref 0.1–1.0)
Monocytes Relative: 2 %
Myelocytes: 8 %
Neutro Abs: 81.8 10*3/uL — ABNORMAL HIGH (ref 1.7–7.7)
Neutrophils Relative %: 58 %
Platelets: 313 10*3/uL (ref 150–400)
Promyelocytes Relative: 2 %
RBC: 2.08 MIL/uL — ABNORMAL LOW (ref 4.22–5.81)
RDW: 19.7 % — ABNORMAL HIGH (ref 11.5–15.5)
Smear Review: INCREASED
WBC: 136.4 10*3/uL (ref 4.0–10.5)
nRBC: 1.4 % — ABNORMAL HIGH (ref 0.0–0.2)
nRBC: 4 /100 WBC — ABNORMAL HIGH

## 2021-01-29 LAB — ABO/RH: ABO/RH(D): B POS

## 2021-01-29 LAB — APTT: aPTT: 36 seconds (ref 24–36)

## 2021-01-29 LAB — LACTIC ACID, PLASMA
Lactic Acid, Venous: 7.1 mmol/L (ref 0.5–1.9)
Lactic Acid, Venous: 3 mmol/L (ref 0.5–1.9)

## 2021-01-29 LAB — CBG MONITORING, ED: Glucose-Capillary: 148 mg/dL — ABNORMAL HIGH (ref 70–99)

## 2021-01-29 MED ORDER — SODIUM CHLORIDE 0.9 % IV SOLN: Freq: Once | INTRAVENOUS | Status: AC

## 2021-01-29 MED ORDER — HYDROXYUREA 500 MG PO CAPS
1000.0000 mg | ORAL_CAPSULE | Freq: Every day | ORAL | Status: DC
  Filled 2021-01-29: qty 2

## 2021-01-29 MED ORDER — ALLOPURINOL 300 MG PO TABS
300.0000 mg | ORAL_TABLET | Freq: Every day | ORAL | Status: DC
  Filled 2021-01-29: qty 1

## 2021-01-29 MED ORDER — LACTATED RINGERS IV BOLUS
1000.0000 mL | Freq: Once | INTRAVENOUS | Status: AC
  Administered 2021-01-29: 1000 mL via INTRAVENOUS

## 2021-01-29 MED ORDER — SODIUM CHLORIDE 0.9% IV SOLUTION
Freq: Once | INTRAVENOUS | Status: DC
  Filled 2021-01-29: qty 250

## 2021-01-29 MED ORDER — LORAZEPAM 2 MG/ML IJ SOLN: 2.0000 mg | Freq: Once | INTRAMUSCULAR | Status: DC

## 2021-01-29 MED ORDER — LORAZEPAM 2 MG/ML IJ SOLN
INTRAMUSCULAR | Status: AC
  Filled 2021-01-29: qty 1

## 2021-01-29 MED ORDER — IOHEXOL 350 MG/ML SOLN
75.0000 mL | Freq: Once | INTRAVENOUS | Status: AC | PRN
  Administered 2021-01-29: 75 mL via INTRAVENOUS

## 2021-01-29 NOTE — Consult Note (Signed)
Hematology/Oncology Consult note [facility] [phone] [phone]  Patient Care Team: Lavera Guise, MD as PCP - General (Internal Medicine) Sindy Guadeloupe, MD as Consulting Physician (Oncology) Edythe Clarity, [facility] as Pharmacist (Pharmacist)   Name of the patient: Samuel Frank  [id number]  02/02/1941    Reason for consult: hyperleucocytosis   Requesting physician: Dr. Cheri Fowler  Date of visit: 01/29/2021    History of presenting illness- Patient is a 80 year old male with a past medical history significant for CMML and hypertension.  He has been on outpatient Vidaza since July 2020.  Last bone marrow biopsy was done in April 2021 which showed some residual CMML but otherwise patient was tolerating treatment well with stable counts.  He has been getting Vidaza 5 days a week every month CBC with differential on 12/27/2020 was white count of 14.3, H&H of 14.5/44.8 with a platelet count of 472.  Patient's white cell count has been between 14-16 since January 2022.  In the past he has had some intermittent iron deficiency anemia when his hemoglobin drops down to the sevens and has required IV iron in the past.  He was seen by GI and underwent colonoscopy which showed hemorrhoids but no other significant findings.  He presented to [facility] ER with symptoms of fatigue and bright red blood per rectum.  CBC on 01/29/2021 showed white count of 136, H&H of 5.4/18.2 with a platelet count of 313.  Oncology consulted for further management.  Patient did receive PRBC transfusion 2 units prior to me seeing him and felt overall better after blood transfusion.  He does feel fatigued presently but denies other complaints at this time.  Hemodynamically has been stable.  ECOG PS- 1  Pain scale- 0   Review of systems- Review of Systems  Constitutional:  Positive for malaise/fatigue. Negative for chills, fever and weight loss.  HENT:  Negative for congestion, ear  discharge and nosebleeds.   Eyes:  Negative for blurred vision.  Respiratory:  Negative for cough, hemoptysis, sputum production, shortness of breath and wheezing.   Cardiovascular:  Negative for chest pain, palpitations, orthopnea and claudication.  Gastrointestinal:  Positive for blood in stool. Negative for abdominal pain, constipation, diarrhea, heartburn, melena, nausea and vomiting.  Genitourinary:  Negative for dysuria, flank pain, frequency, hematuria and urgency.  Musculoskeletal:  Negative for back pain, joint pain and myalgias.  Skin:  Negative for rash.  Neurological:  Negative for dizziness, tingling, focal weakness, seizures, weakness and headaches.  Endo/Heme/Allergies:  Does not bruise/bleed easily.  Psychiatric/Behavioral:  Negative for depression and suicidal ideas. The patient does not have insomnia.    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   Left wrist pain 07/16/2018   Leukocytosis 03/25/2018   Acute gouty arthritis 03/25/2018   Need for vaccination against Streptococcus pneumoniae using pneumococcal conjugate vaccine 13 01/16/2018   Encounter for general adult medical examination with abnormal findings 07/13/2017   Dysuria 07/13/2017   Closed fracture of rib of left side with routine healing 06/24/2017   Gastroesophageal reflux disease without esophagitis 06/24/2017   Morbid obesity with  BMI of 40.0-44.9, adult ([place]) 03/13/2017   Primary osteoarthritis of right hip 03/13/2017   Trochanteric bursitis of right hip 03/13/2017   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 08/31/2016   Polio 08/31/2016   Essential hypertension, benign 04/05/2015   Iron deficiency anemia due to chronic blood loss 04/05/2015     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HCC)    CMML (chronic myelomonocytic leukemia) ([place])    Dyspnea    Hypertension      Past Surgical History:  Procedure Laterality Date   COLONOSCOPY N/A 08/28/2019   Procedure: COLONOSCOPY;  Surgeon: Toledo, Benay Pike, MD;  Location: [facility] ENDOSCOPY;  Service: Gastroenterology;  Laterality: N/A;   COLONOSCOPY WITH PROPOFOL N/A 05/03/2015   Procedure: COLONOSCOPY WITH PROPOFOL;  Surgeon: [name], MD;  Location: [facility] ENDOSCOPY;  Service: Gastroenterology;  Laterality: N/A;   ESOPHAGOGASTRODUODENOSCOPY N/A 08/28/2019   Procedure: ESOPHAGOGASTRODUODENOSCOPY (EGD);  Surgeon: Toledo, Benay Pike, MD;  Location: [facility] ENDOSCOPY;  Service: Gastroenterology;  Laterality: N/A;   ESOPHAGOGASTRODUODENOSCOPY (EGD) WITH PROPOFOL N/A 05/03/2015   Procedure: ESOPHAGOGASTRODUODENOSCOPY (EGD) WITH PROPOFOL;  Surgeon: [name], MD;  [facility] ENDOSCOPY;  Service: Gastroenterology;  Laterality: N/A;   HERNIA REPAIR     1975    Social History   Socioeconomic History   Marital status: Divorced    Spouse name: Not on file   Number of children: 4   Years of education: Not on file   Highest education level: Not on file  Occupational History   Occupation: retired    Comment: [employer] [place] [employer]  Tobacco Use   Smoking status: Never   Smokeless tobacco: Never  Vaping Use   Vaping Use: Never used  Substance and Sexual Activity   Alcohol use: No   Drug use: No   Sexual activity: Not Currently  Other Topics Concern   Not on file  Social History Narrative   Not on file   Social Determinants of Health   Financial Resource Strain: Low Risk    Difficulty of Paying Living Expenses: Not very hard  Food Insecurity: Not on file   Transportation Needs: Not on file  Physical Activity: Not on file  Stress: Not on file  Social Connections: Not on file  Intimate Partner Violence: Not on file     Family History  Problem Relation Age of Onset   Alzheimer's disease Father    Lung cancer Sister    Cancer Sister      Current Facility-Administered Medications:    0.9 %  sodium chloride infusion (Manually program via Guardrails IV Fluids), , Intravenous, Once, Alfred Levins, [place], MD, Held at 01/29/21 0635   0.9 %  sodium chloride infusion (Manually program via Guardrails IV Fluids), , Intravenous, Once, Bradler, Vista Lawman, MD   0.9 %  sodium chloride infusion, , Intravenous, Once, Sindy Guadeloupe, MD   allopurinol (ZYLOPRIM) tablet 300 mg, 300 mg, Oral, Daily, Sindy Guadeloupe, MD   hydroxyurea (HYDREA) capsule 1,000 mg, 1,000 mg, Oral, Daily, Sindy Guadeloupe, MD   LORazepam (ATIVAN) injection 2 mg, 2 mg, Intravenous, Once, Bradler, Vista Lawman, MD  Current Outpatient Medications:    allopurinol (ZYLOPRIM) 300 MG tablet, Take 1 tablet (300 mg total) by mouth daily., Disp: 90 tablet, Rfl: 2   aspirin EC 81 MG tablet, Take 81 mg by mouth daily., Disp: , Rfl:    hydrochlorothiazide (MICROZIDE) 12.5 MG capsule, TAKE 1 CAPSULE BY  MOUTH EVERY DAY, Disp: 30 capsule, Rfl: 5   pantoprazole (PROTONIX) 40 MG tablet, Take 1 tablet (40 mg total) by mouth daily., Disp: 90 tablet, Rfl: 0   potassium chloride SA (KLOR-CON M20) 20 MEQ tablet, Take 1 tablet (20 mEq total) by mouth 2 (two) times daily., Disp: 180 tablet, Rfl: 1   traZODone (DESYREL) 50 MG tablet, Take 1 tablet (50 mg total) by mouth at bedtime., Disp: 30 tablet, Rfl: 1   valACYclovir (VALTREX) 500 MG tablet, TAKE 1 TABLET BY MOUTH EVERY DAY, Disp: 90 tablet, Rfl: 1   furosemide (LASIX) 20 MG tablet, Take 1 tablet (20 mg total) by mouth 2 (two) times daily. Take 62m twice a day for one week. (Patient not taking: Reported on 01/29/2021), Disp: 14 tablet, Rfl: 0   triamcinolone  ointment (KENALOG) 0.5 %, APPLY 1 APPLICATION TOPICALLY 2 TIMES DAILY. TO THE AFFECTED AREAS OF FEET AND LEFT SIDE OF STOMACH (Patient not taking: Reported on 01/29/2021), Disp: 30 g, Rfl: 0   Physical exam:  Vitals:   01/29/21 1145 01/29/21 1200 01/29/21 1220 01/29/21 1230  BP:   (!) 104/57 (!) 105/50  Pulse: 96 100 (!) 101 99  Resp: 12 (!) 21 (!) 22 (!) 21  Temp:      TempSrc:      SpO2: 98% 98% 99% 97%  Weight:      Height:       Physical Exam Constitutional:      General: He is not in acute distress. Cardiovascular:     Rate and Rhythm: Normal rate and regular rhythm.     Heart sounds: Normal heart sounds.  Pulmonary:     Effort: Pulmonary effort is normal.     Breath sounds: Normal breath sounds.  Abdominal:     General: Bowel sounds are normal.     Palpations: Abdomen is soft.     Comments: No palpable splenomegaly  Lymphadenopathy:     Comments: No palpable cervical supraclavicular or inguinal adenopathy  Skin:    General: Skin is warm and dry.  Neurological:     Mental Status: He is alert and oriented to person, place, and time.       CMP Latest Ref Rng & Units 01/29/2021  Glucose 70 - 99 mg/dL 254(H)  BUN 8 - 23 mg/dL 13  Creatinine 0.61 - 1.24 mg/dL 1.52(H)  Sodium 135 - 145 mmol/L 137  Potassium 3.5 - 5.1 mmol/L 3.4(L)  Chloride 98 - 111 mmol/L 106  CO2 22 - 32 mmol/L 19(L)  Calcium 8.9 - 10.3 mg/dL 8.1(L)  Total Protein 6.5 - 8.1 g/dL -  Total Bilirubin 0.3 - 1.2 mg/dL -  Alkaline Phos 38 - 126 U/L -  AST 15 - 41 U/L -  ALT 0 - 44 U/L -   CBC Latest Ref Rng & Units 01/29/2021  WBC 4.0 - 10.5 K/uL 136.4(HH)  Hemoglobin 13.0 - 17.0 g/dL 5.4(L)  Hematocrit 39.0 - 52.0 % 18.2(L)  Platelets 150 - 400 K/uL 313    '@IMAGES' @  DG Chest Portable 1 View  Result Date: 01/29/2021 CLINICAL DATA:  80year old male with history of hypoxia. Seizure and hypotension. EXAM: PORTABLE CHEST 1 VIEW COMPARISON:  Chest x-ray 05/06/2017. FINDINGS: Lung volumes are  normal. No consolidative airspace disease. No pleural effusions. No pneumothorax. No pulmonary nodule or mass noted. Pulmonary vasculature and the cardiomediastinal silhouette are within normal limits. Atherosclerotic calcifications in the thoracic aorta. IMPRESSION: 1.  No radiographic evidence of acute cardiopulmonary disease. 2. Aortic  atherosclerosis. Electronically Signed   By: Vinnie Langton M.D.   On: 01/29/2021 06:49   CT Angio Abd/Pel W and/or Wo Contrast  Result Date: 01/29/2021 CLINICAL DATA:  GI bleed EXAM: CTA ABDOMEN AND PELVIS WITHOUT AND WITH CONTRAST TECHNIQUE: Multidetector CT imaging of the abdomen and pelvis was performed using the standard protocol during bolus administration of intravenous contrast. Multiplanar reconstructed images and MIPs were obtained and reviewed to evaluate the vascular anatomy. CONTRAST:  58m OMNIPAQUE IOHEXOL 350 MG/ML SOLN COMPARISON:  None. FINDINGS: VASCULAR Scattered coronary calcifications. Aorta: Minimal calcified atheromatous plaque. No aneurysm, dissection, or stenosis. Celiac: Patent without evidence of aneurysm, dissection, vasculitis or significant stenosis. SMA: Patent without evidence of aneurysm, dissection, vasculitis or significant stenosis. Renals: Single right, widely patent.  Single left, widely patent. IMA: Patent without evidence of aneurysm, dissection, vasculitis or significant stenosis. Inflow: Minimal scattered calcified plaque. No aneurysm, dissection, or stenosis. Proximal Outflow: Mildly atheromatous, patent. Veins: Patent hepatic veins, portal vein, SM V, splenic vein, bilateral renal veins, iliac venous system and IVC. No venous pathology identified. Review of the MIP images confirms the above findings. NON-VASCULAR Lower chest: No pleural or pericardial effusion. Linear scarring or subsegmental atelectasis medially at both lung bases. Hepatobiliary: No focal liver abnormality is seen. No gallstones, gallbladder wall thickening, or  biliary dilatation. Pancreas: Unremarkable. No pancreatic ductal dilatation or surrounding inflammatory changes. Spleen: Normal in size without focal abnormality. Adrenals/Urinary Tract: No adrenal nodule. Small bilateral renal lesions probably cysts, incompletely characterized. No urolithiasis or hydronephrosis. Urinary bladder incompletely distended. Stomach/Bowel: Small hiatal hernia. The stomach is physiologically distended, unremarkable. The small bowel is nondilated. Normal appendix. The colon is nondilated, unremarkable. No active extravasation localized. Lymphatic: No abdominal or pelvic adenopathy. Reproductive: Prostate is unremarkable. Other: No ascites.  Right pelvic phleboliths.  No free air. Musculoskeletal: Spondylitic changes throughout the lower thoracic and lumbar spine. Mild inferior endplate deformity of L2 without acute features. No focal bone lesion. IMPRESSION: 1. No evidence of active extravasation. 2. Coronary and aortic Atherosclerosis (ICD10-170.0). 3. Small hiatal hernia. Electronically Signed   By: DLucrezia EuropeM.D.   On: 01/29/2021 10:40    Assessment and plan- Patient is a 80y.o. male with history of CMML on Vidaza since July 2020 with stable counts now presenting with acute lower GI bleed and found to have white cell count of 136  I have reviewed the patient's peripheral smear personally and it was reviewed by pathologist Dr. JRonnald Rampas well.  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  Hemoglobin is down to 5 but it is unclear how much of that is secondary to GI bleed versus involvement of leukemia.  Platelet counts are normal.  Discussed with the patient and family that overall prognosis of transformed CMML to acute leukemia especially after progression on Vidaza is poor.  We have the following options moving forward:  [facility] get [facility]  in-house flow and bone marrow biopsy ASAP and start treatment relatively soon. Attempt treatment for his acute leukemia here and I could give him venetoclax plus Vidaza but I would need results from peripheral blood flow cytometry and/or bone marrow biopsy results which would not be available for the next 3 to 4 days.  Although patient does not have any overt symptoms of hyper leukocytosis-if his white cell count worsens further and there is a potential need for pheresis  we do not have the ability to do it here.  I will start him on Hydrea 1 g daily at this time along with allopurinol 300 mg daily.  He also has evidence of AKI with a creatinine that is elevated to 1.5 and I will start him on IV fluids in the meanwhile. Forego any aggressive intervention and proceed with best supportive care/hospice  Patient had understands all his options.  Although he is 14, he is overall fit and has tolerated vidaza treatment very well.  He understands that his overall prognosis is poor but would like to attempt treatment for his potential acute leukemia at this time.  He would like to proceed with option 1 and potentially get transferred to a tertiary center.  I have spoken to Dr. Leory Plowman from ER about this and he is arranging for a transfer potentially to [facility].  I also got in touch with Dr. Amalia Hailey who is willing to accept the patient to leukemia service if there is a bed available at [facility].Patient remains hemodynamically stable at this time.  CODE STATUS and treatment preferences discussed with the patient and he would like to be DNR/DNI.  I have updated the chart accordingly.  With regards to GI bleed-if his hemoglobin remained stable-given his hyperleukocytosis it would be reasonable to hold off on any invasive procedures such as colonoscopy.  GI at [place] will be seeing him today.   Visit Diagnosis 1. Gastrointestinal hemorrhage, unspecified gastrointestinal hemorrhage type   2. Hemorrhagic shock ([place])   3.  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 Randa Evens, MD, MPH [facility] [phone] 01/29/2021  1:00 PM

## 2021-01-29 NOTE — ED Notes (Signed)
Verbal Consent given by patient for Emergent Blood Transfusion with RN Merleen Nicely

## 2021-01-29 NOTE — ED Provider Notes (Signed)
Emergency department handoff note  Care of this patient was signed out to me at the end of the previous provider shift.  All pertinent patient information was conveyed and all questions were answered.  Patient pending lab work and CT results.  Patient has significantly anemic with hemoglobin of 5.4 and will continue with the ordered packed red blood cell transfusions for him as blood pressure did improve after 2 units of emergency release blood.  CT did not show any evidence of active extravasation from the GI tract.  Given these findings, patient will require admission to the internal medicine service for further evaluation and management.  CRITICAL CARE Performed by: Naaman Plummer   Total critical care time: 33 minutes  Critical care time was exclusive of separately billable procedures and treating other patients.  Critical care was necessary to treat or prevent imminent or life-threatening deterioration.  Critical care was time spent personally by me on the following activities: development of treatment plan with patient and/or surrogate as well as nursing, discussions with consultants, evaluation of patient's response to treatment, examination of patient, obtaining history from patient or surrogate, ordering and performing treatments and interventions, ordering and review of laboratory studies, ordering and review of radiographic studies, pulse oximetry and re-evaluation of patient's condition.    Naaman Plummer, MD 01/29/21 [phone]

## 2021-01-29 NOTE — Consult Note (Signed)
Samuel Frank , MD [address] [place], [place], [place], [address] [address], [place], [place], [place], [address] Phone: [phone]  Fax: [phone]  Consultation  Referring Provider:     No ref. provider found Primary Care Physician:  Lavera Guise, MD Primary Gastroenterologist:  Dr. Alice Reichert         Reason for Consultation:     GI bleed  Date of Admission:  01/29/2021 Date of Consultation:  01/29/2021         HPI:   Samuel Frank. is a 80 y.o. male who is a patient of Dr. Alice Reichert and last seen in the office in June 2021 for iron deficiency anemia.  Has had an upper endoscopy as well as colonoscopy in June 2021 that showed no gross abnormalities except internal hemorrhoids and the plan was to perform a capsule study of the small bowel.  I do not see any results of the capsule study.  Biopsies of stomach showed moderate chronic gastritis.  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  He had 2 units of PRBCs.  CT angiogram was performed in the ER that showed no evidence of active bleeding but a small hiatal hernia.  He has not had a repeat CBC after transfusion.  From the hematology point of view he is being transferred to [facility] as this concern for blasts and possible transformation to an acute leukemia.  He states that he has not had any further bowel movement since yesterday.  No further bleeding.  Prior to which he states he suffered from constipation and on and off for over a few months she has had bright red blood per rectum after bowel movement.  Denies any hematemesis no abdominal pains.  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[place])    Dyspnea    Hypertension     Past Surgical History:  Procedure Laterality Date   COLONOSCOPY N/A  08/28/2019   Procedure: COLONOSCOPY;  Surgeon: Toledo, Benay Pike, MD;  Location: [facility] ENDOSCOPY;  Service: Gastroenterology;  Laterality: N/A;   COLONOSCOPY WITH PROPOFOL N/A 05/03/2015   Procedure: COLONOSCOPY WITH PROPOFOL;  Surgeon: [name], MD;  Location: [facility] ENDOSCOPY;  Service: Gastroenterology;  Laterality: N/A;   ESOPHAGOGASTRODUODENOSCOPY N/A 08/28/2019   Procedure: ESOPHAGOGASTRODUODENOSCOPY (EGD);  Surgeon: Toledo, Benay Pike, MD;  Location: [facility] ENDOSCOPY;  Service: Gastroenterology;  Laterality: N/A;   ESOPHAGOGASTRODUODENOSCOPY (EGD) WITH PROPOFOL N/A 05/03/2015   Procedure: ESOPHAGOGASTRODUODENOSCOPY (EGD) WITH PROPOFOL;  Surgeon: [name], MD;  Location[facility] ENDOSCOPY;  Service: Gastroenterology;  Laterality: N/A;   HERNIA REPAIR     1975    Prior to Admission medications   Medication Sig Start Date End Date Taking? Authorizing Provider  allopurinol (ZYLOPRIM) 300 MG tablet Take 1 tablet (300 mg total) by mouth daily. 12/02/20   Sindy Guadeloupe, MD  aspirin EC 81 MG tablet Take 81 mg by mouth daily.    [provider]  furosemide (LASIX) 20 MG tablet Take 1 tablet (20 mg total) by mouth 2 (two) times daily. Take 20mg  twice a day for one week. 12/28/20   Sindy Guadeloupe, MD  hydrochlorothiazide (MICROZIDE) 12.5 MG capsule TAKE 1 CAPSULE BY MOUTH EVERY DAY 10/06/20  Sindy Guadeloupe, MD  pantoprazole (PROTONIX) 40 MG tablet Take 1 tablet (40 mg total) by mouth daily. 01/05/21   Jonetta Osgood, NP  potassium chloride SA (KLOR-CON M20) 20 MEQ tablet Take 1 tablet (20 mEq total) by mouth 2 (two) times daily. 12/08/20   Sindy Guadeloupe, MD  traZODone (DESYREL) 50 MG tablet Take 1 tablet (50 mg total) by mouth at bedtime. 12/08/20   Sindy Guadeloupe, MD  triamcinolone ointment (KENALOG) 0.5 % APPLY 1 APPLICATION TOPICALLY 2 TIMES DAILY. TO THE AFFECTED AREAS OF FEET AND LEFT SIDE OF STOMACH 08/04/20   Jacquelin Hawking, NP  valACYclovir (VALTREX) 500 MG tablet TAKE 1 TABLET BY MOUTH  EVERY DAY 10/17/20   Sindy Guadeloupe, MD    Family History  Problem Relation Age of Onset   Alzheimer's disease Father    Lung cancer Sister    Cancer Sister      Social History   Tobacco Use   Smoking status: Never   Smokeless tobacco: Never  Vaping Use   Vaping Use: Never used  Substance Use Topics   Alcohol use: No   Drug use: No    Allergies as of 01/29/2021   (No Known Allergies)    Review of Systems:    All systems reviewed and negative except where noted in HPI.   Physical Exam:  Vital signs in last 24 hours: Temp:  [97.9 F (36.6 C)] 97.9 F (36.6 C) (11/26 0610) Pulse Rate:  [97-115] 99 (11/26 0945) Resp:  [12-27] 17 (11/26 0945) BP: (74-112)/(44-68) 107/68 (11/26 0945) SpO2:  [95 %-100 %] 100 % (11/26 0945) Weight:  [143.3 kg] 143.3 kg (11/26 0612)   General:   Pleasant, cooperative in NAD Head:  Normocephalic and atraumatic. Eyes:   No icterus.   Conjunctiva pink. PERRLA. Ears:  Normal auditory acuity. Neck:  Supple; no masses or thyroidomegaly Lungs: Respirations even and unlabored. Lungs clear to auscultation bilaterally.   No wheezes, crackles, or rhonchi.  Heart:  Regular rate and rhythm;  Without murmur, clicks, rubs or gallops Abdomen:  Soft, nondistended, nontender. Normal bowel sounds. No appreciable masses or hepatomegaly.  No rebound or guarding.  Neurologic:  Alert and oriented x3;  grossly normal neurologically. Psych:  Alert and cooperative. Normal affect.  LAB RESULTS: Recent Labs    01/29/21 0618  WBC 136.4*  HGB 5.4*  HCT 18.2*  PLT 313   BMET Recent Labs    01/29/21 0610  NA 137  K 3.4*  CL 106  CO2 19*  GLUCOSE 254*  BUN 13  CREATININE 1.52*  CALCIUM 8.1*   LFT No results for input(s): PROT, ALBUMIN, AST, ALT, ALKPHOS, BILITOT, BILIDIR, IBILI in the last 72 hours. PT/INR Recent Labs    01/29/21 0610  LABPROT 16.0*  INR 1.3*    STUDIES: DG Chest Portable 1 View  Result Date: 01/29/2021 CLINICAL DATA:   80 year old male with history of hypoxia. Seizure and hypotension. EXAM: PORTABLE CHEST 1 VIEW COMPARISON:  Chest x-ray 05/06/2017. FINDINGS: Lung volumes are normal. No consolidative airspace disease. No pleural effusions. No pneumothorax. No pulmonary nodule or mass noted. Pulmonary vasculature and the cardiomediastinal silhouette are within normal limits. Atherosclerotic calcifications in the thoracic aorta. IMPRESSION: 1.  No radiographic evidence of acute cardiopulmonary disease. 2. Aortic atherosclerosis. Electronically Signed   By: Vinnie Langton M.D.   On: 01/29/2021 06:49   CT Angio Abd/Pel W and/or Wo Contrast  Result Date: 01/29/2021 CLINICAL DATA:  GI bleed EXAM: CTA  ABDOMEN AND PELVIS WITHOUT AND WITH CONTRAST TECHNIQUE: Multidetector CT imaging of the abdomen and pelvis was performed using the standard protocol during bolus administration of intravenous contrast. Multiplanar reconstructed images and MIPs were obtained and reviewed to evaluate the vascular anatomy. CONTRAST:  12mL OMNIPAQUE IOHEXOL 350 MG/ML SOLN COMPARISON:  None. FINDINGS: VASCULAR Scattered coronary calcifications. Aorta: Minimal calcified atheromatous plaque. No aneurysm, dissection, or stenosis. Celiac: Patent without evidence of aneurysm, dissection, vasculitis or significant stenosis. SMA: Patent without evidence of aneurysm, dissection, vasculitis or significant stenosis. Renals: Single right, widely patent.  Single left, widely patent. IMA: Patent without evidence of aneurysm, dissection, vasculitis or significant stenosis. Inflow: Minimal scattered calcified plaque. No aneurysm, dissection, or stenosis. Proximal Outflow: Mildly atheromatous, patent. Veins: Patent hepatic veins, portal vein, SM V, splenic vein, bilateral renal veins, iliac venous system and IVC. No venous pathology identified. Review of the MIP images confirms the above findings. NON-VASCULAR Lower chest: No pleural or pericardial effusion. Linear  scarring or subsegmental atelectasis medially at both lung bases. Hepatobiliary: No focal liver abnormality is seen. No gallstones, gallbladder wall thickening, or biliary dilatation. Pancreas: Unremarkable. No pancreatic ductal dilatation or surrounding inflammatory changes. Spleen: Normal in size without focal abnormality. Adrenals/Urinary Tract: No adrenal nodule. Small bilateral renal lesions probably cysts, incompletely characterized. No urolithiasis or hydronephrosis. Urinary bladder incompletely distended. Stomach/Bowel: Small hiatal hernia. The stomach is physiologically distended, unremarkable. The small bowel is nondilated. Normal appendix. The colon is nondilated, unremarkable. No active extravasation localized. Lymphatic: No abdominal or pelvic adenopathy. Reproductive: Prostate is unremarkable. Other: No ascites.  Right pelvic phleboliths.  No free air. Musculoskeletal: Spondylitic changes throughout the lower thoracic and lumbar spine. Mild inferior endplate deformity of L2 without acute features. No focal bone lesion. IMPRESSION: 1. No evidence of active extravasation. 2. Coronary and aortic Atherosclerosis (ICD10-170.0). 3. Small hiatal hernia. Electronically Signed   By: Lucrezia Europe M.D.   On: 01/29/2021 10:40      Impression / Plan:   Samuel Frank. is a 80 y.o. y/o male with a history of CMML presented emergency room with rectal bleeding.  CT angiogram showed no active bleeding.  Incidentally on his labs are found to have a white cell count of 136, hematology evaluated the peripheral smear and there is concern for blast cell transformation for an acute leukemia.  Plan is for transfer to [facility].  He is at high risk for any endoscopy procedure due to elevated white cell count which can cause thrombosis/stroke.  From the GI point of view suggest to maintain on IV PPI.  Monitor CBC and transfuse as needed.  If has concern for active bleeding consider CT angiogram or tagged RBC scan and if  intervention is needed proceed by the vascular route.  His history of bleeding associated with passage of hard stools are suggestive of hemorrhoidal bleed.  Thank you for involving me in the care of this patient.      LOS: 0 days   Samuel Bellows, MD  01/29/2021, 11:08 AM

## 2021-01-29 NOTE — ED Notes (Signed)
Face sheet faxed and images powershared

## 2021-01-29 NOTE — ED Notes (Signed)
Pt stated he needed to use bathroom to have a BM- pt was moving around and stated he felt nauseous- grabbed a vomit bag for pt and pt began to vomit and eyes rolled back and he began to have seizure like activity- suctioned vomit to maintain airway- pt only had activity for 30 seconds before it stopped- Dr Cheri Fowler was notified

## 2021-01-29 NOTE — ED Provider Notes (Signed)
[facility] Emergency Department Provider Note  ____________________________________________  Time seen: Approximately 6:35 AM  I have reviewed the triage vital signs and the nursing notes.   HISTORY  Chief Complaint Seizures   HPI Samuel Frank. is a 80 y.o. 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  According to the patient he started feeling very weak this evening.  When EMS arrived to the house patient had an episode of LOC lasting about 20 seconds for which she was noticed to have some jerking movements.  There has been no postictal phase, no urinary or bowel incontinence.  No prior history of seizures.  Patient reports that over the last 3 to 4 days he has had 4-5 large episodes of bloody diarrhea.  Has had some cramping diffuse abdominal pain associated with it.  No nausea or vomiting.  Patient is not on blood thinners.  He denies chest pain, headache, shortness of breath, cough, fever.   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[place])    Dyspnea    Hypertension     Patient Active Problem List   Diagnosis Date Noted   Pre-diabetes 10/03/2020   Primary osteoarthritis of right knee 02/26/2020   Needs flu shot 12/14/2019   Atherosclerosis of aorta ([place]) 12/01/2019   Hematochezia 08/21/2019   Acute upper respiratory infection 05/31/2019   Iron deficiency anemia 11/25/2018   Chronic myelomonocytic leukemia not having achieved remission ([place]) 09/13/2018   Left wrist pain 07/16/2018   Leukocytosis 03/25/2018   Acute gouty arthritis 03/25/2018   Need for vaccination against Streptococcus pneumoniae using pneumococcal conjugate vaccine 13 01/16/2018   Encounter for general adult medical  examination with abnormal findings 07/13/2017   Dysuria 07/13/2017   Closed fracture of rib of left side with routine healing 06/24/2017   Gastroesophageal reflux disease without esophagitis 06/24/2017   Morbid obesity with BMI of 40.0-44.9, adult ([place]) 03/13/2017   Primary osteoarthritis of right hip 03/13/2017   Trochanteric bursitis of right hip 03/13/2017   Other cervical disc degeneration at C5-C6 level 08/31/2016   Polio 08/31/2016   Essential hypertension, benign 04/05/2015   Iron deficiency anemia due to chronic blood loss 04/05/2015    Past Surgical History:  Procedure Laterality Date   COLONOSCOPY N/A 08/28/2019   Procedure: COLONOSCOPY;  Surgeon: Toledo, Benay Pike, MD;  Location: [facility] ENDOSCOPY;  Service: Gastroenterology;  Laterality: N/A;   COLONOSCOPY WITH PROPOFOL N/A 05/03/2015   Procedure: COLONOSCOPY WITH PROPOFOL;  Surgeon: [name], MD;  Location: [facility] ENDOSCOPY;  Service: Gastroenterology;  Laterality: N/A;   ESOPHAGOGASTRODUODENOSCOPY N/A 08/28/2019   Procedure: ESOPHAGOGASTRODUODENOSCOPY (EGD);  Surgeon: Toledo, Benay Pike, MD;  Location: [facility] ENDOSCOPY;  Service: Gastroenterology;  Laterality: N/A;   ESOPHAGOGASTRODUODENOSCOPY (EGD) WITH PROPOFOL N/A 05/03/2015   Procedure: ESOPHAGOGASTRODUODENOSCOPY (EGD) WITH PROPOFOL;  Surgeon: [name], MD;  Location: [facility] ENDOSCOPY;  Service: Gastroenterology;  Laterality: N/A;   HERNIA REPAIR     1975    Prior to Admission medications   Medication Sig Start Date End Date Taking? Authorizing Provider  allopurinol (ZYLOPRIM) 300 MG tablet Take 1 tablet (300 mg total) by mouth daily. 12/02/20   Sindy Guadeloupe, MD  aspirin EC 81 MG tablet Take 81 mg by mouth daily.    [provider]  furosemide (LASIX) 20 MG tablet Take 1 tablet (20 mg total) by mouth 2 (two) times daily. Take 20mg  twice a day for one week. 12/28/20   Sindy Guadeloupe, MD  hydrochlorothiazide (MICROZIDE) 12.5 MG capsule TAKE 1 CAPSULE BY MOUTH EVERY DAY  10/06/20   Sindy Guadeloupe, MD  pantoprazole (PROTONIX) 40 MG tablet Take 1 tablet (40 mg total) by mouth daily. 01/05/21   Jonetta Osgood, NP  potassium chloride SA (KLOR-CON M20) 20 MEQ tablet Take 1 tablet (20 mEq total) by mouth 2 (two) times daily. 12/08/20   Sindy Guadeloupe, MD  Tdap Durwin Reges) 5-2.5-18.5 LF-MCG/0.5 injection Inject into the muscle once. Patient not taking: Reported on 12/28/2020    [provider]  traZODone (DESYREL) 50 MG tablet Take 1 tablet (50 mg total) by mouth at bedtime. 12/08/20   Sindy Guadeloupe, MD  triamcinolone ointment (KENALOG) 0.5 % APPLY 1 APPLICATION TOPICALLY 2 TIMES DAILY. TO THE AFFECTED AREAS OF FEET AND LEFT SIDE OF STOMACH 08/04/20   Jacquelin Hawking, NP  valACYclovir (VALTREX) 500 MG tablet TAKE 1 TABLET BY MOUTH EVERY DAY 10/17/20   Sindy Guadeloupe, MD  Zoster Vaccine Adjuvanted [facility]) injection Inject 0.5 mLs into the muscle once. Patient not taking: Reported on 12/28/2020    [provider]    Allergies Patient has no known allergies.  Family History  Problem Relation Age of Onset   Alzheimer's disease Father    Lung cancer Sister    Cancer Sister     Social History Social History   Tobacco Use   Smoking status: Never   Smokeless tobacco: Never  Vaping Use   Vaping Use: Never used  Substance Use Topics   Alcohol use: No   Drug use: No    Review of Systems  Constitutional: Negative for fever. + LOC and generalized weakness Eyes: Negative for visual changes. ENT: Negative for sore throat. Neck: No neck pain  Cardiovascular: Negative for chest pain. Respiratory: Negative for shortness of breath. Gastrointestinal: Negative for abdominal pain, vomiting. + bloody diarrhea. Genitourinary: Negative for dysuria. Musculoskeletal: Negative for back pain. Skin: Negative for rash. Neurological: Negative for headaches, weakness or numbness. Psych: No SI or  HI  ____________________________________________   PHYSICAL EXAM:  VITAL SIGNS: ED Triage Vitals  Enc Vitals Group     BP 01/29/21 0610 (!) 74/44     Pulse Rate 01/29/21 0610 (!) 115     Resp 01/29/21 0610 18     Temp 01/29/21 0610 (S) 97.9 F (36.6 C)     Temp Source 01/29/21 0610 (S) Rectal     SpO2 01/29/21 0610 98 %     Weight 01/29/21 0612 (!) 316 lb (143.3 kg)     Height 01/29/21 0612 6\' 2"  (1.88 m)     Head Circumference --      Peak Flow --      Pain Score 01/29/21 0612 0     Pain Loc --      Pain Edu? --      Excl. in [place]? --     Constitutional: Alert and oriented, extremely pale HEENT:      Head: Normocephalic and atraumatic.         Eyes: Conjunctivae are normal. Sclera is non-icteric.       Mouth/Throat: Mucous membranes are dry.       Neck: Supple with no signs of meningismus. Cardiovascular: Tachycardic with regular rhythm Respiratory: Normal respiratory effort.  Hypoxic with sats in the  mid 70s, lungs are clear to auscultation Gastrointestinal: Soft, non tender, and non distended with positive bowel sounds. No rebound or guarding. Genitourinary: Rectal exam showing gross blood Musculoskeletal:  No edema, cyanosis, or erythema of extremities. Neurologic: Normal speech and language. Face is symmetric. Moving all extremities. No gross focal neurologic deficits are appreciated. Skin: Skin is warm, dry and intact. No rash noted. Psychiatric: Mood and affect are normal. Speech and behavior are normal.  ____________________________________________   LABS (all labs ordered are listed, but only abnormal results are displayed)  Labs Reviewed  LACTIC ACID, PLASMA - Abnormal; Notable for the following components:      Result Value   Lactic Acid, Venous 7.1 (*)    All other components within normal limits  CBC WITH DIFFERENTIAL/PLATELET - Abnormal; Notable for the following components:   WBC 136.4 (*)    RBC 2.08 (*)    Hemoglobin 5.4 (*)    HCT 18.2 (*)     MCHC 29.7 (*)    RDW 19.7 (*)    nRBC 1.4 (*)    All other components within normal limits  CBG MONITORING, ED - Abnormal; Notable for the following components:   Glucose-Capillary 148 (*)    All other components within normal limits  RESP PANEL BY RT-PCR (FLU A&B, COVID) ARPGX2  BASIC METABOLIC PANEL  LACTIC ACID, PLASMA  PROTIME-INR  APTT  CBG MONITORING, ED  TYPE AND SCREEN  PREPARE RBC (CROSSMATCH)   ____________________________________________  EKG  ED ECG REPORT I, Rudene Re, the attending physician, personally viewed and interpreted this ECG.  Sinus tachycardia at the rate of 115, occasional PVCs, no ST elevations or depressions. ____________________________________________  RADIOLOGY  I have personally reviewed the images performed during this visit and I agree with the Radiologist's read.   Interpretation by Radiologist:  DG Chest Portable 1 View  Result Date: 01/29/2021 CLINICAL DATA:  80 year old male with history of hypoxia. Seizure and hypotension. EXAM: PORTABLE CHEST 1 VIEW COMPARISON:  Chest x-ray 05/06/2017. FINDINGS: Lung volumes are normal. No consolidative airspace disease. No pleural effusions. No pneumothorax. No pulmonary nodule or mass noted. Pulmonary vasculature and the cardiomediastinal silhouette are within normal limits. Atherosclerotic calcifications in the thoracic aorta. IMPRESSION: 1.  No radiographic evidence of acute cardiopulmonary disease. 2. Aortic atherosclerosis. Electronically Signed   By: Vinnie Langton M.D.   On: 01/29/2021 06:49     ____________________________________________   PROCEDURES  Procedure(s) performed:yes .1-3 Lead EKG Interpretation Performed by: Rudene Re, MD Authorized by: Rudene Re, MD     Interpretation: non-specific     ECG rate assessment: tachycardic     Rhythm: sinus tachycardia     Ectopy: none     Conduction: normal     Critical Care performed: yes  CRITICAL  CARE Performed by: Rudene Re  ?  Total critical care time: 30 min  Critical care time was exclusive of separately billable procedures and treating other patients.  Critical care was necessary to treat or prevent imminent or life-threatening deterioration.  Critical care was time spent personally by me on the following activities: development of treatment plan with patient and/or surrogate as well as nursing, discussions with consultants, evaluation of patient's response to treatment, examination of patient, obtaining history from patient or surrogate, ordering and performing treatments and interventions, ordering and review of laboratory studies, ordering and review of radiographic studies, pulse oximetry and re-evaluation of patient's condition.   ____________________________________________   INITIAL IMPRESSION / ASSESSMENT AND PLAN / ED COURSE  80 y.o. 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  Patient arrives to the emergency department extremely hypotensive with systolics in the 12W, tachycardic with a pulse in the 110s.  3 to 4 days of bloody diarrhea with a rectal exam showing gross blood.  Also hypoxic with clear lungs.  Patient looks extremely pale on exam.  Presentation concerning for hemorrhagic shock in the setting of the GI bleed.  Review of epic shows the patient had an episode of hematochezia back in 2021 for each she had a colonoscopy and an endoscopy.  I reviewed both of them which showed only internal hemorrhoids.  He is not on any blood thinners.  We will start with emergent release blood 1 unit, a liter bolus.  Patient was placed on nasal cannula at 2 L with improvement of his sats.  We will get a portable chest x-ray.  Blood work is pending.  Anticipate admission to the hospitalist service.  Patient was placed  on telemetry for close monitoring of cardiorespiratory status  _________________________ 7:14 AM on 01/29/2021 ----------------------------------------- BP and HR improving after 1U pRBCs and bolus.  Hemoglobin 5.4.  Awaiting more blood from the blood bank for continuous transfusion.  WBC of 136.  Patient's previous blood work from a month ago showed a white count of 14.3.  Lactic of 7.1.  We will continue resuscitation.  Remainder of the blood work is pending.  Anticipate admission and consultation with oncology.  Care transferred to Dr. Cheri Fowler      _____________________________________________ Please note:  Patient was evaluated in Emergency Department today for the symptoms described in the history of present illness. Patient was evaluated in the context of the global COVID-19 pandemic, which necessitated consideration that the patient might be at risk for infection with the SARS-CoV-2 virus that causes COVID-19. Institutional protocols and algorithms that pertain to the evaluation of patients at risk for COVID-19 are in a state of rapid change based on information released by regulatory bodies including the CDC and federal and state organizations. These policies and algorithms were followed during the patient's care in the ED.  Some ED evaluations and interventions may be delayed as a result of limited staffing during the pandemic.   [place] Controlled Substance Database was reviewed by me. ____________________________________________   FINAL CLINICAL IMPRESSION(S) / ED DIAGNOSES   Final diagnoses:  Gastrointestinal hemorrhage, unspecified gastrointestinal hemorrhage type  Hemorrhagic shock ([place])      NEW MEDICATIONS STARTED DURING THIS VISIT:  ED Discharge Orders     None        Note:  This document was prepared using Dragon voice recognition software and may include unintentional dictation errors.    Rudene Re, MD 01/29/21 [phone]

## 2021-01-29 NOTE — ED Notes (Signed)
Pt taken off O2 to see how sats would do- sats stay above 95% on RA

## 2021-01-29 NOTE — ED Triage Notes (Signed)
From home by EMS for seizure and hypotension; A/O x 4; skin cool/ pale for ethnicity; respirations even non-labored

## 2021-01-29 NOTE — Progress Notes (Signed)
Called to admit patient who presents to the emergency room for evaluation of acute blood loss anemia with hemorrhagic shock.  Patient responded to IV fluid hydration and has been transfused 2 units of packed RBC. Patient has a history of CML and is noted to have a white count of 136.4. 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 Oncologist, Dr Janese Banks discussed findings with patient who has requested transfer to [facility] for further evaluation and treatment. Dr Cheri Fowler, ER physician has been informed and will make arrangements for patient to be transferred to [facility].

## 2021-01-31 ENCOUNTER — Inpatient Hospital Stay: Payer: Medicare Other

## 2021-01-31 ENCOUNTER — Inpatient Hospital Stay: Payer: Medicare Other | Admitting: Oncology

## 2021-01-31 LAB — COMP PANEL: LEUKEMIA/LYMPHOMA

## 2021-01-31 LAB — PATHOLOGIST SMEAR REVIEW

## 2021-02-01 ENCOUNTER — Inpatient Hospital Stay: Payer: Medicare Other

## 2021-02-01 LAB — TYPE AND SCREEN
ABO/RH(D): B POS
Antibody Screen: NEGATIVE
Unit division: 0
Unit division: 0
Unit division: 0
Unit division: 0
Unit division: 0
Unit division: 0

## 2021-02-01 LAB — BPAM RBC
Blood Product Expiration Date: 202212242359
Blood Product Expiration Date: 202212242359
Blood Product Expiration Date: 202212242359
Blood Product Expiration Date: 202212242359
Blood Product Expiration Date: 202301012359
Blood Product Expiration Date: 202301022359
ISSUE DATE / TIME: 202211260629
ISSUE DATE / TIME: 202211260629
ISSUE DATE / TIME: 202211290522
Unit Type and Rh: 5100
Unit Type and Rh: 5100
Unit Type and Rh: 7300
Unit Type and Rh: 7300
Unit Type and Rh: 7300
Unit Type and Rh: 7300

## 2021-02-01 LAB — PREPARE RBC (CROSSMATCH)

## 2021-02-02 ENCOUNTER — Inpatient Hospital Stay: Payer: Medicare Other

## 2021-02-02 DIAGNOSIS — C9310 Chronic myelomonocytic leukemia not having achieved remission: Principal | ICD-10-CM

## 2021-02-03 ENCOUNTER — Telehealth: Payer: Self-pay | Admitting: *Deleted

## 2021-02-03 ENCOUNTER — Inpatient Hospital Stay: Payer: Medicare Other

## 2021-02-03 ENCOUNTER — Telehealth: Payer: Self-pay | Admitting: Oncology

## 2021-02-03 MED ORDER — HYDROXYUREA 500 MG CAPSULE
ORAL_CAPSULE | Freq: Two times a day (BID) | ORAL | 0 refills | 14 days | Status: CP
Start: 2021-02-03 — End: 2021-02-17
  Filled 2021-02-03: qty 28, 14d supply, fill #0

## 2021-02-03 MED ORDER — PANTOPRAZOLE 40 MG TABLET,DELAYED RELEASE
ORAL_TABLET | Freq: Two times a day (BID) | ORAL | 0 refills | 30.00000 days | Status: CP
Start: 2021-02-03 — End: 2021-03-05

## 2021-02-03 NOTE — Telephone Encounter (Signed)
Samuel Frank [phone]) called and left vm on triage. Her father has been in/out of hospital and ER. and  this family member needs fmla to transport patient back and forth to the hospital. I reviewed pt's chart and didn't find a DPR on file to speak to this family member.  Looks like patient was transferred to [facility] on 01/29/21 per Dr. Francine Graven and Dr. Janese Banks notes. 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  I called family member back at 0959 am- left detailed vm that call was being returned. Advised family to have FMLA completed by [facility] oncology at this time since pt is under the care of [facility]. Provided a phone # to reach our clinic for any other questions or concerns. I will also send a mychart msg to the pt/family.

## 2021-02-03 NOTE — Telephone Encounter (Signed)
Not sure if patient has acute leukemia yet. We can support family for FMLA after he is discharged

## 2021-02-03 NOTE — Telephone Encounter (Signed)
Tia from [facility] called to have lab work for Monday 12-5. Please give her a return call at [phone]

## 2021-02-04 ENCOUNTER — Other Ambulatory Visit: Payer: Self-pay

## 2021-02-04 ENCOUNTER — Inpatient Hospital Stay: Payer: Medicare Other

## 2021-02-04 ENCOUNTER — Inpatient Hospital Stay: Payer: Medicare Other | Attending: Nurse Practitioner

## 2021-02-04 DIAGNOSIS — C9200 Acute myeloblastic leukemia, not having achieved remission: Principal | ICD-10-CM

## 2021-02-04 DIAGNOSIS — C9310 Chronic myelomonocytic leukemia not having achieved remission: Secondary | ICD-10-CM | POA: Diagnosis not present

## 2021-02-04 LAB — CBC WITH DIFFERENTIAL/PLATELET
Abs Immature Granulocytes: 3.71 10*3/uL — ABNORMAL HIGH (ref 0.00–0.07)
Basophils Absolute: 0.4 10*3/uL — ABNORMAL HIGH (ref 0.0–0.1)
Basophils Relative: 1 %
Eosinophils Absolute: 0.5 10*3/uL (ref 0.0–0.5)
Eosinophils Relative: 2 %
HCT: 27.4 % — ABNORMAL LOW (ref 39.0–52.0)
Hemoglobin: 8.6 g/dL — ABNORMAL LOW (ref 13.0–17.0)
Immature Granulocytes: 11 %
Lymphocytes Relative: 10 %
Lymphs Abs: 3.3 10*3/uL (ref 0.7–4.0)
MCH: 27.1 pg (ref 26.0–34.0)
MCHC: 31.4 g/dL (ref 30.0–36.0)
MCV: 86.4 fL (ref 80.0–100.0)
Monocytes Absolute: 3.7 10*3/uL — ABNORMAL HIGH (ref 0.1–1.0)
Monocytes Relative: 11 %
Neutro Abs: 21.9 10*3/uL — ABNORMAL HIGH (ref 1.7–7.7)
Neutrophils Relative %: 65 %
Platelets: 182 10*3/uL (ref 150–400)
RBC: 3.17 MIL/uL — ABNORMAL LOW (ref 4.22–5.81)
RDW: 18.6 % — ABNORMAL HIGH (ref 11.5–15.5)
Smear Review: NORMAL
WBC: 33.4 10*3/uL — ABNORMAL HIGH (ref 4.0–10.5)
nRBC: 0.5 % — ABNORMAL HIGH (ref 0.0–0.2)

## 2021-02-04 LAB — BASIC METABOLIC PANEL
Anion gap: 9 (ref 5–15)
BUN: 9 mg/dL (ref 8–23)
CO2: 27 mmol/L (ref 22–32)
Calcium: 8.3 mg/dL — ABNORMAL LOW (ref 8.9–10.3)
Chloride: 101 mmol/L (ref 98–111)
Creatinine, Ser: 1.43 mg/dL — ABNORMAL HIGH (ref 0.61–1.24)
GFR, Estimated: 49 mL/min — ABNORMAL LOW (ref >60)
Glucose, Bld: 114 mg/dL — ABNORMAL HIGH (ref 70–99)
Potassium: 3.3 mmol/L — ABNORMAL LOW (ref 3.5–5.1)
Sodium: 137 mmol/L (ref 135–145)

## 2021-02-04 MED ORDER — ALLOPURINOL 300 MG TABLET
ORAL_TABLET | Freq: Every day | ORAL | 2 refills | 30 days | Status: CP
Start: 2021-02-04 — End: ?

## 2021-02-04 MED ORDER — POSACONAZOLE 100 MG TABLET,DELAYED RELEASE
ORAL_TABLET | Freq: Every day | ORAL | 2 refills | 30 days | Status: CP
Start: 2021-02-04 — End: ?
  Filled 2021-02-10: qty 90, 30d supply, fill #0

## 2021-02-04 MED ORDER — VENETOCLAX 100 MG TABLET
ORAL_TABLET | Freq: Every day | ORAL | 0 refills | 30 days | Status: CP
Start: 2021-02-04 — End: ?
  Filled 2021-02-10: qty 120, 30d supply, fill #0

## 2021-02-07 ENCOUNTER — Ambulatory Visit: Admit: 2021-02-07 | Discharge: 2021-02-08 | Payer: MEDICARE

## 2021-02-07 ENCOUNTER — Encounter
Admit: 2021-02-07 | Discharge: 2021-02-08 | Payer: MEDICARE | Attending: Hematology & Oncology | Primary: Hematology & Oncology

## 2021-02-07 ENCOUNTER — Other Ambulatory Visit: Admit: 2021-02-07 | Discharge: 2021-02-08 | Payer: MEDICARE

## 2021-02-07 ENCOUNTER — Inpatient Hospital Stay: Payer: Medicare Other

## 2021-02-07 DIAGNOSIS — C9200 Acute myeloblastic leukemia, not having achieved remission: Principal | ICD-10-CM

## 2021-02-07 DIAGNOSIS — C9310 Chronic myelomonocytic leukemia not having achieved remission: Principal | ICD-10-CM

## 2021-02-07 MED ORDER — HYDROXYUREA 500 MG CAPSULE
ORAL_CAPSULE | Freq: Two times a day (BID) | ORAL | 0 refills | 4.00000 days | Status: CP
Start: 2021-02-07 — End: 2021-02-07

## 2021-02-07 NOTE — Telephone Encounter (Signed)
Also I told Odella that vonda will need to send her flma to [facility] with Dr. Janene Madeira

## 2021-02-07 NOTE — Telephone Encounter (Signed)
I had called daughter Doree Albee and let her know that Dr. Janese Banks has been speaking to Dr. Janene Madeira and the fact that she is not going to be in the country for about 1 month in Dec. She feels that he can get treatment at [facility] and when she comes back she can speak to [place] and see how Mr. Fill is doing and then make a plan to come back to [place] and she will tell her DAd and he will be good with the plan. Also we will add Jodie Echevaria another daughter per Doree Albee asking her Dad and he said yes. The 2 daughters will be helping him get back and forth for visits.

## 2021-02-07 NOTE — Unmapped (Signed)
It was a pleasure taking care of you!    Diagnosis: GI bleed without known cause (possibly hemorrhoids), Acute Leukemia     Course complications:  -  none    Chemotherapy during admission? yes (  - Type of chemotherapy: Hydrea  - Post chemotherapy nausea regimen: none  - Pegfilgrastim (Neulasta, Ziextenzo, or equivalent) injection not given   - Labs with transfusion support 2 times weekly    New/Important Medications:  - Hydrea, Protonix twice a day     Follow up Appointments:   - discussed personally with patient, with Dr Vertell Limber within week of discharge as well as labs/infusions twice weekly set up in following week    --------------  Outpatient follow up:   [ ]  2 view CXR to eval mediastinal LAD on 11/26 portable CXR   [ ]  iron repletion - 2.2 g def calc     Gary Baird is a 80 y.o. 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      Hemorrhagic shock   Vasovagal syncope   Hematochezia  Melena  Hemorrhoids   Presented with frequent, bright red bowel movements, syncope on daily aspirin. Also had 9 point drop in Hb over 1 month, increased DOE last several days. 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 CT AP angiogram performed before transfer negative for bleed as well as prior colonoscopies (last one ~2.5 yr prior, last endoscopy several years ago). Has never had a pill endoscopy. Last red bowel movement (evidence of bleeding) afternoon of 11/28. Colonoscopy/endoscopy/capsule endoscopy 11/30 did not show any acute bleed. Did show known diffuse atrophic gastric mucosa, hemorrhoids in lower colon and capsule endoscopy was pending final review, but prelim showed no bleeding of small bowel. AXR confirmed no retention of camera. Discharged on BID oral PPI. GI Luminal was consulted throughout hospitalization.     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 Abs neutrophils 82, abs lymphs 11, abs eos 1.4 and abs mono 2.7. While transformation of CMML to acute leukemia is possible, leukemoid reaction 2/2 stress of GI bleed may also be reason for profound neutrophilia. Marrow may have been predisposed to hyperactivity w/ his baseline of active CMML (currently receiving monthly Azacytidine injections since 09/2018). Imagine it is more likely that neutrophilia/leukocytosis was precipitated by bleed as the reverse is not logical, as his plt are normal in amount, with normal coags. WBC has trended down nicely with decreased frequency of bloody bowel movements and Hydrea. Latter was started prior to his transfer, decreased to 500 bid and then discontinued 11/29. TLS, coags were normal on admission and after Hydrea. Smear showed <10% blasts peripherally. Will require iron repletion outpatient (ferritin 17 on adm but anemia likely 2/2 blood loss/IDA), 2.2 g iron deficit but has received 3 uRBC thus far. 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 Will follow up with Dr Vertell Limber re: treatment plan week of 12/12. Discharged on below meds:   - cont Valtrex ppx   - continue hydrea 500mg  bid     Bicytopenia and immunosuppression  due to leukemia   - valtrex prophylaxis     AKI (baseline ~0.8 26yrs ago), resolved   AKI most likely prerenal due to hypovolemia, less likely TLS, AIN, or postrenal but will trend labs and evaluate as picture evolves. U/A on admission showed hematuria, with repeat without this. Cr improving.      Lower extremity edema  Relatively new over last several weeks. BNP normal on admission.  - compression socks, elev/activity as able    --     When to Call Your [facility] Cancer Care Team:   Monday- Friday from 8:00 am - 5:00 pm: Call [phone] or Toll free [phone].  Ask to speak to the Triage Nurse  On Nights, Weekends and Holidays: Call [phone]. Ask the operator to page the Oncology Fellow on Call     RED ZONE:  Take action now!  You need to be seen right away. Call 911 or go to your nearest hospital for help.   - Symptoms are at a severe level of discomfort    - Bleeding that will not stop  - Chest Pain    - Hard to breathe    - Fall or passing out    - New Seizure    - Thoughts of hurting yourself or others     YELLOW ZONE: Take action today  This is NOT an all-inclusive list. Pleae call with any new or worsening symptoms.   Call your doctor, nurse or other healthcare provider at [phone]  You can be seen by a provider the same day through our Same Day Acute Care for Patients with Cancer program.   - Symptoms are new or worsening; You are not within your goal range for:    - Pain          - Swelling (leg, arm, abdomen, face, neck)    - Shortness of breath        - Skin rash or skin changes    - Bleeding (nose, urine, stool, wound)    - Wound issues (redness, drainage, re-opened)    - Feeling sick to your stomach and throwing up    - Confusion    - Mouth sores/pain in your mouth or throat     - Vision changes   - Hard stool or very loose stools (increase in ostomy   - Fever >100.4 F, chills     Output)        - Worsening cough with mucus that is green, yellow or bloody   - No urine for 12 hours      - Pain or burning when going to the bathroom    - Feeding tube or other catheter/tube issue     - Home infusion pump issue - call [phone]   - Redness or pain at previous IV or port/catheter site    - Depressed or anxiety     GREEN ZONE: You are in control   Your symptoms are under control. Continue to take your medicine as ordered. Keep all visits to the doctor.   - No increase or worsening symptoms   - Able to take your medicine   - Able to drink and eat     For your safety and best care, please DO NOT use MyChart messages to report red or yellow symptoms.   MyChart messages are only checked during weekday normal business hours and you should receive a   Response within 2 business days.   Please use MyChart only for  the follows:   - Non-urgent medication refills, scheduling requests or general questions.           Patient Education:     - Wash your hands routinely with soap and water  - Take your temperature when you have chills or are not feeling well  - Use a soft toothbrush  - Avoid constipation or straining with bowel movements. This may mean you occasionally need to take over-the-counter stool softeners or laxatives.   - Avoid people who have colds or the flu, or are not feeling well.  - Wear a mask when visiting crowded places.  - Maintain a well-balanced diet and eat healthy foods  - Speak with your doctor before having any dental work done  - Do only as much activity as you can tolerate    Other instructions:  - Don't use dental floss if your platelet count is below 50,000. Your doctor or nurse should tell you if this is the case.  - Use any mouthwashes given to you as directed.  - If you can't tolerate regular brushing, use an oral swab (bristle-less) toothbrush, or use salt and baking soda to clean your mouth. Mix 1 teaspoon of salt and 1 teaspoon of baking soda into an 8-ounce glass of warm water. Swish and spit.  - Watch your mouth and tongue for white patches. This is a sign of fungal infection, a common side effect of chemotherapy. Be sure to tell your doctor about these patches. Medication/mouthwashes can be prescribed to help you fight the fungal infection.      COVID-19 is a new challenge, but [facility] and the [facility] is dedicated to providing you and your loved ones with the best possible cancer care and support in the safest way possible during this time. We made two videos about the ways we are working to keep you safe, such as offering the option to visit your care team over the phone or through a video, as well as support services offered for our patients and their caregivers. If you have any questions about your cancer care, please call your care team.     Video #1: Keeping [facility] Cancer Care patients safe during the COVID-19 crisis  [email or web address]     Video #2: Support for cancer patients and their caregivers during the COVID-19 pandemic  [email or web address]     Video #2: Support for cancer patients and their caregivers during the COVID-19 pandemic  [email or web address].[email or web address]

## 2021-02-08 NOTE — Unmapped (Signed)
Physician Discharge Summary [facility]  4 ONC [facility]  [address]  [place] [address]  Dept: [phone]  Loc: [phone]     Identifying Information:   Kyriakos Babler  05-14-1940  [id number]    Primary Care Physician: [facility]     Referring Physician: Carrington Clamp     Code Status: Full Code    Admit Date: 01/29/2021    Discharge Date: 02/07/2021     Discharge To: Home    Discharge Service: [facility] - Hematology Res Floor Team (MEDE)     Discharge Attending Physician: No att. providers found    Discharge Diagnoses:  Principal Problem:    Hemorrhagic shock (CMS-HCC)  Active Problems:    Vasovagal syncope    Hypertension    Coronary artery disease without angina pectoris    AKI (acute kidney injury) (CMS-HCC)    Hematochezia    Melena    Hemorrhoid    Anemia  Resolved Problems:    * No resolved hospital problems. *      Outpatient Provider Follow Up Issues:   Follow-up Plan after discharge:  1. Issues related to hospitalization: none  2. Follow-up appointment for lab draws: arranged and discussed with Mr Michels by me personally   3. Follow-up appointment with [facility] Oncology: Dr Vertell Limber 12/7  4. Oncology specific plans going forward: TBD    Patient's primary oncologist and/or nurse navigator: Dr Jari Sportsman handoff via Epic message or direct conversation?: Yes.    Supportive Care Recommendations:  We recommend based on the patient???s underlying diagnosis and treatment history the following supportive care:    1. 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 Blood product support:  Leukoreduced blood products are required.  Irradiated blood products are preferred, but in case of urgent transfusion needs non-irradiated blood products may be used:     -  No special recommendations.  Follow internal standard practices.    Based on the patient's disease status and intensity of therapy, complete blood count with differential should be evaluated 2 times per week and used to guide transfusion support    3. Hematopoietic growth factor support: none    Hospital Course:   Outpatient follow up:   [ ]  2 view CXR to eval mediastinal LAD on 11/26 portable CXR   [ ]  iron repletion - 2.2 g def calc     Alyaan Budzynski is a 80 y.o. 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      Hemorrhagic shock   Vasovagal syncope   Hematochezia  Melena  Hemorrhoids   Presented with frequent, bright red bowel movements, syncope on daily aspirin. Also had 9 point drop in Hb over 1 month, increased DOE last several days. 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 CT AP angiogram performed before transfer negative for bleed as well as prior colonoscopies (last one ~  2.5 yr prior, last endoscopy several years ago). Has never had a pill endoscopy. Last red bowel movement (evidence of bleeding) afternoon of 11/28. Colonoscopy/endoscopy/capsule endoscopy 11/30 did not show any acute bleed. Did show known diffuse atrophic gastric mucosa, hemorrhoids in lower colon and capsule endoscopy was pending final review, but prelim showed no bleeding of small bowel. AXR confirmed no retention of camera. Discharged on BID oral PPI. GI Luminal was consulted throughout hospitalization.     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 Abs neutrophils 82, abs lymphs 11, abs eos 1.4 and abs mono 2.7. While transformation of CMML to acute leukemia is possible, leukemoid reaction 2/2 stress of GI bleed may also be reason for profound neutrophilia. Marrow may have been predisposed to hyperactivity w/ his baseline of active CMML (currently receiving monthly Azacytidine injections since 09/2018). Imagine it is more likely that neutrophilia/leukocytosis was precipitated by bleed as the reverse is not logical, as his plt are normal in amount, with normal coags. WBC has trended down nicely with decreased frequency of bloody bowel movements and Hydrea. Latter was started prior to his transfer, decreased to 500 bid and then discontinued 11/29. TLS, coags were normal on admission and after Hydrea. Smear showed <10% blasts peripherally. Will require iron repletion outpatient (ferritin 17 on adm but anemia likely 2/2 blood loss/IDA), 2.2 g iron deficit but has received 3 uRBC thus far. 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 Will follow up with Dr Vertell Limber re: treatment plan week of 12/12. Discharged on below meds:   - cont Valtrex ppx   - continue hydrea 500mg  bid     Bicytopenia and immunosuppression due to leukemia   - valtrex prophylaxis     AKI (baseline ~0.8 4yrs ago), resolved   AKI most likely prerenal due to hypovolemia, less likely TLS, AIN, or postrenal but will trend labs and evaluate as picture evolves. U/A on admission showed hematuria, with repeat without this. Cr improving.      Lower extremity edema  Relatively new over last several weeks. BNP normal on admission.  - compression socks, elev/activity as able      Procedures:  EGD, colonoscopy and capsule endoscopy as well as BMBx  No admission procedures for hospital encounter.  ______________________________________________________________________  Discharge Medications:     Your Medication List      STOP taking these medications aspirin 81 MG tablet  Commonly known as: ECOTRIN     hydroCHLOROthiazide 12.5 mg capsule  Commonly known as: MICROZIDE        CHANGE how you take these medications    pantoprazole 40 MG tablet  Commonly known as: PROTONIX  Take 1 tablet (40 mg total) by mouth Two (2) times a day.  What changed: when to take this        CONTINUE taking these medications    potassium chloride 20 MEQ CR tablet  Commonly known as: KLOR-CON  Take 20 mEq by mouth Two (2) times a day.     traZODone 50 MG tablet  Commonly known as: DESYREL  Take 50 mg by mouth daily.     valACYclovir 500 MG tablet  Commonly known as: VALTREX  Take 500 mg by mouth daily.            Allergies:  Patient has no known allergies.  ______________________________________________________________________  Pending Test Results (if blank, then none):  Pending Labs     Order Current Status  DNA Extract and Hold In process    RNA Extract and Hold In process    Cytogenetics AP Order Preliminary result          Most Recent Labs:  All lab results last 24 hours -   Recent Results (from the past 24 hour(s))   Type and Screen    Collection Time: 02/07/21 10:44 AM   Result Value Ref Range    Antibody Screen NEG     Blood Type B POS    CBC w/ Differential    Collection Time: 02/07/21 10:44 AM   Result Value Ref Range    WBC 32.0 (H) 3.6 - 11.2 10*9/L    RBC 3.26 (L) 4.26 - 5.60 10*12/L    HGB 8.4 (L) 12.9 - 16.5 g/dL    HCT 98.1 (L) 19.1 - 48.0 %    MCV 81.0 77.6 - 95.7 fL    MCH 25.8 (L) 25.9 - 32.4 pg    MCHC 31.8 (L) 32.0 - 36.0 g/dL    RDW 47.8 (H) 29.5 - 15.2 %    MPV 7.9 6.8 - 10.7 fL    Platelet 182 150 - 450 10*9/L    Neutrophils % 80.9 %    Lymphocytes % 7.5 %    Monocytes % 9.1 %    Eosinophils % 1.6 %    Basophils % 0.9 %    Absolute Neutrophils 25.9 (H) 1.8 - 7.8 10*9/L    Absolute Lymphocytes 2.4 1.1 - 3.6 10*9/L    Absolute Monocytes 2.9 (H) 0.3 - 0.8 10*9/L    Absolute Eosinophils 0.5 0.0 - 0.5 10*9/L    Absolute Basophils 0.3 (H) 0.0 - 0.1 10*9/L Anisocytosis Moderate (A) Not Present    Hypochromasia Moderate (A) Not Present   Morphology Review    Collection Time: 02/07/21 10:44 AM   Result Value Ref Range    Smear Review Comments See Comment (A) Undefined    Basophilic Stippling Present (A) Not Present    Hypersegmented Neutrophils Present (A) Not Present       Relevant Studies/Radiology (if blank, then none):  No results found.  ______________________________________________________________________  Discharge Instructions:                     Appointments which have been scheduled for you    Feb 09, 2021  8:15 AM  (Arrive by 7:45 AM)  LAB ONLY [place] with ADULT ONC LAB  [facility] ADULT ONCOLOGY LAB DRAW STATION [place] [facility] REGION) [address]  [place] [address]  [phone]      Feb 09, 2021  9:00 AM  (Arrive by 8:30 AM)  NEW HEM MALIG ONC  with Avie Arenas, MD  [facility] HEMATOLOGY ONCOLOGY 2ND FLR CANCER HOSP [facility] REGION) [address]  [place] [address]  [phone]      Feb 09, 2021 10:00 AM  (Arrive by 9:30 AM)  BLOOD TRANSFUSION - 2 UNITS with [facility] CHAIR 13  [place] ONCOLOGY INFUSION [place] [facility] REGION) [address]  [place] [address]  [phone]      Additional instructions:    You have a lab visit at Dr. Assunta Gambles office on 02/07/2021 at 2:15PM.  If you need to reschedule this visit, please give their office a call.    [facility]  [address]  [place], [place] [address]   [phone]  ______________________________________________________________________  Discharge Day Services:  BP 117/63  - Pulse 105  - Temp 36.8 ??C (98.2 ??F)  - Resp 18  - Ht 188 cm (6' 2)  - Wt (!) 138.3 kg (305 lb)  - SpO2 98%  - BMI 39.16 kg/m??   Pt seen on the day of discharge and determined appropriate for discharge.    Condition at Discharge: good    Length of Discharge: I spent greater than 30 mins in the discharge of this patient.    I saw and evaluated the patient, participating in the key portions of the service on the day of discharge.?? I reviewed the resident???s note and agree with the discharge plans and disposition. I personally spent <30 minutes in discharge planning services. Gae Bon, MD

## 2021-02-09 ENCOUNTER — Other Ambulatory Visit: Admit: 2021-02-09 | Discharge: 2021-02-10 | Payer: MEDICARE

## 2021-02-09 ENCOUNTER — Ambulatory Visit: Admit: 2021-02-09 | Discharge: 2021-02-10 | Payer: MEDICARE

## 2021-02-09 ENCOUNTER — Ambulatory Visit
Admit: 2021-02-09 | Discharge: 2021-02-10 | Payer: MEDICARE | Attending: Hematology & Oncology | Primary: Hematology & Oncology

## 2021-02-09 DIAGNOSIS — C9310 Chronic myelomonocytic leukemia not having achieved remission: Principal | ICD-10-CM

## 2021-02-09 DIAGNOSIS — C9200 Acute myeloblastic leukemia, not having achieved remission: Principal | ICD-10-CM

## 2021-02-09 MED ORDER — HYDROXYUREA 500 MG CAPSULE
ORAL_CAPSULE | Freq: Two times a day (BID) | ORAL | 0 refills | 7 days | Status: CP
Start: 2021-02-09 — End: 2021-02-16
  Filled 2021-02-09: qty 56, 7d supply, fill #0

## 2021-02-09 NOTE — Unmapped (Signed)
PIV obtained and blood specimen's sent to core lab for processing. Shavaun Osterloh,RN

## 2021-02-10 NOTE — Unmapped (Signed)
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  I am speaking to the patient's family member, daughter Gary Baird.    Was a Nurse, learning disability used for this call? No    Verified patient's date of birth / HIPAA.    Specialty medication(s) to be sent: Hematology/Oncology: Noxafil and venetoclax    Non-specialty medications/supplies to be sent: none    Medications not needed at this time: none     Noxafil (posaconazole)    Medication & Administration     Dosage:   ??? Take 3 tablets (300 mg total) by mouth once daily.    Administration:   ??? Take with food  ??? Swallow the pills whole, do not break, crush, or chew    Adherence/Missed dose instructions:  ??? Take a missed dose as soon as you think about it with food  ??? If it is close to your next dose, skip the missed dose and go back to your normal time.  ??? Do not take 2 doses at the same time or extra doses.  ??? Report any missed doses to coordinator    Goals of Therapy   ??? To prevent or treat fungal infections    Side Effects and Monitoring Parameters   ??? Common side effects  ??? Headache  ??? Feeling tired or weak  ??? Stomach pain, upset stomach, vomiting  ??? Diarrhea or constipation  ??? Cough  ??? Loss of appetite    ??? The following side effects should be reported to the provider:  ??? Allergic reaction (rash, hives, swelling, blistering or peeling of skin, shortness of breath)  ??? Infection (fever, chills, sore throat, ear/sinus pain, cough, sputum change, urinary pain, mouth sores, non-healing wounds)  ??? Any unexplained bleeding, abnormal vaginal bleeding, nosebleed  ??? Electrolyte problems (mood changes, confusion, weakness, abnormal heartbeat, seizures)  ??? Swelling in arms or legs  ??? Very tired or weak  ??? Liver issues (yellowing of skin or eyes, dark urine, light stools, severe stomach pain)  ??? Fast or abnormal heartbeat or passing out    ??? Monitoring parameters  ??? Renal and hepatic function  ??? CBC  ??? Adequate hydration  ??? For invasive aspergillosis    Contraindications, Warnings, & Precautions   ??? Do not take if you are allergic to ingredients in this medication  ??? Be cautious with electrolyte imbalances, cardiac histories, and hepatic or renal dysfunction- speak with provider  ??? Speak with provider immediately if you are pregnant, plan to become pregnant, or are breastfeeding      Drug/Food Interactions   ??? Medication list reviewed in Epic. The patient was instructed to inform the care team before taking any new medications or supplements. Inhibitors of the Proton Pump (PPIs and PCABs) may decrease the serum concentration of Posaconazole - Significance of this interaction appears to be more noteable with oral suspension then delayed release tablets .  2. Posaconazole may increase the serum concentration of Venetoclax- venetoclax will be dose reduced once posa initiiated.  3. Posa - CYP3A4 Inhibitors (Strong) may increase the serum concentration of TraZODone - Consider change in therapy or use of a lower dose of trazodone. .     Storage, Handling Precautions, & Disposal   ??? Store at room temperature  ??? Keep away from children and pets    Current Medications (including OTC/herbals), Comorbidities and Allergies  Current Outpatient Medications   Medication Sig Dispense Refill   ??? allopurinol (ZYLOPRIM) 300 MG tablet Take 1 tablet (300 mg total) by mouth daily. 30 tablet 2   ??? hydroxyurea (HYDREA) 500 mg capsule Take 4 capsules (2,000 mg total) by mouth Two (2) times a day for 7 days. 56 capsule 0   ??? pantoprazole (PROTONIX) 40 MG tablet Take 1 tablet (40 mg total) by mouth Two (2) times a day. 60 tablet 0   ??? posaconazole (NOXAFIL) 100 mg delayed released tablet Take 3 tablets (300 mg total) by mouth daily. 90 tablet 2   ??? traZODone (DESYREL) 50 MG tablet Take 50 mg by mouth daily.     ??? valACYclovir (VALTREX) 500 MG tablet Take 500 mg by mouth daily.     ??? venetoclax (VENCLEXTA) 100 mg tablet Take 4 tablets (400 mg total) by mouth once daily. Take with a meal and water. Do not chew, crush, or break tablets. 120 tablet 0     No current facility-administered medications for this visit.       No Known Allergies    Patient Active Problem List   Diagnosis   ??? CMML (chronic myelomonocytic leukemia) (CMS-HCC)   ??? Hemorrhagic shock (CMS-HCC)   ??? Vasovagal syncope   ??? Hypertension   ??? Coronary artery disease without angina pectoris   ??? AKI (acute kidney injury) (CMS-HCC)   ??? Hematochezia   ??? Melena   ??? Hemorrhoid   ??? Anemia   ??? 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    Appropriateness of Therapy     Acute infections noted within Epic:  No active infections  Patient reported infection: None    Is medication and dose appropriate based on diagnosis and infection status? Yes    Prescription has been clinically reviewed: Yes      Baseline Quality of Life Assessment      How many days over the past month did your AML  keep you from your normal activities? For example, brushing your teeth or getting up in the morning. 0    Financial Information     Medication Assistance provided: Prior Authorization    Anticipated copay of $0 / 30 days reviewed with patient. Verified delivery address.    Delivery Information     Scheduled delivery date: 02/11/21    Expected start date: TBD after initiating Venetoclax    Medication will be delivered via Next Day Courier to the prescription address in [facility].  This shipment will not require a signature.      Explained the services we provide at [facility] and that each month we would call to set up refills.  Stressed importance of returning phone calls so that we could ensure they receive their medications in time each month.  Informed patient that we should be setting up refills 7-10 days prior to when they will run out of medication.  A pharmacist will reach out to perform a clinical assessment periodically.  Informed patient that a welcome packet, containing information about our pharmacy and other support services, a Notice of Privacy Practices, and a drug information handout will be sent.      The patient or caregiver noted above participated in the development of this care plan and knows that they can request review of or adjustments to the care plan at any time.      Patient  or caregiver verbalized understanding of the above information as well as how to contact the pharmacy at [phone] option 4 with any questions/concerns.  The pharmacy is open Monday through Friday 8:30am-4:30pm.  A pharmacist is available 24/7 via pager to answer any clinical questions they may have.    Patient Specific Needs     - Does the patient have any physical, cognitive, or cultural barriers? No    - Does the patient have adequate living arrangements? (i.e. the ability to store and take their medication appropriately) Yes    - Did you identify any home environmental safety or security hazards? No    - Patient prefers to have medications discussed with  Daughter      - Is the patient or caregiver able to read and understand education materials at a high school level or above? Yes    - Patient's primary language is  English     - Is the patient high risk? Yes, patient is taking oral chemotherapy. Appropriateness of therapy as been assessed    SOCIAL DETERMINANTS OF HEALTH     At the [facility], we have learned that life circumstances - like trouble affording food, housing, utilities, or transportation can affect the health of many of our patients.   That is why we wanted to ask: are you currently experiencing any life circumstances that are negatively impacting your health and/or quality of life? Patient declined to answer    Social Determinants of Health     Food Insecurity: No Food Insecurity   ??? Worried About Programme researcher, broadcasting/film/video in the Last Year: Never true   ??? Ran Out of Food in the Last Year: Never true   Tobacco Use: Low Risk    ??? Smoking Tobacco Use: Never   ??? Smokeless Tobacco Use: Never   ??? Passive Exposure: Not on file   Transportation Needs: No Transportation Needs   ??? Lack of Transportation (Medical): No   ??? Lack of Transportation (Non-Medical): No   Alcohol Use: Not on file   Housing/Utilities: Low Risk    ??? Within the past 12 months, have you ever stayed: outside, in a car, in a tent, in an overnight shelter, or temporarily in someone else's home (i.e. couch-surfing)?: No   ??? Are you worried about losing your housing?: No   ??? Within the past 12 months, have you been unable to get utilities (heat, electricity) when it was really needed?: No   Substance Use: Not on file   Financial Resource Strain: Low Risk    ??? Difficulty of Paying Living Expenses: Not very hard   Physical Activity: Not on file   Health Literacy: Not on file   Stress: Not on file   Intimate Partner Violence: Not on file   Depression: Not on file   Social Connections: Not on file       Would you be willing to receive help with any of the needs that you have identified today? Not applicable       Kenny Rea A [facility] Specialty Pharmacist

## 2021-02-10 NOTE — Unmapped (Signed)
[facility] Specialty Medication Onboarding    Specialty Medication: Posaconazole 100mg   Prior Authorization: Approved   Financial Assistance: No - copay  <$25  Final Copay/Day Supply: $0 / 30    Insurance Restrictions: None     Notes to Pharmacist:     The triage team has completed the benefits investigation and has determined that the patient is able to fill this medication at [facility]. Please contact the patient to complete the onboarding or follow up with the prescribing physician as needed.    [facility] Specialty Medication Onboarding    Specialty Medication: Venclexta 100mg   Prior Authorization: Approved   Financial Assistance: No - copay  <$25  Final Copay/Day Supply: $9.85 / 30    Insurance Restrictions: None     Notes to Pharmacist:     The triage team has completed the benefits investigation and has determined that the patient is able to fill this medication at [facility]. Please contact the patient to complete the onboarding or follow up with the prescribing physician as needed.

## 2021-02-10 NOTE — Unmapped (Signed)
[facility]   Patient Onboarding/Medication Counseling    [name] is a 80 y.o. 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  I am speaking to the patient's family member, daughter [name].    Was a Nurse, learning disability used for this call? No    Verified patient's date of birth / HIPAA.    Specialty medication(s) to be sent: Hematology/Oncology: Noxafil and Venclexta    Non-specialty medications/supplies to be sent: none    Medications not needed at this time: none     Venclexta (Venetoclax)    Medication & Administration     Dosage: AML: Day 1: Take 100 mg by mouth once daily. Day 2: Take 200 mg by mouth once daily. Day 3 and thereafter: Take 400 mg by mouth once daily.    Administration:   ??? Take with a meal.  ??? Swallow whole with a glass of water. Do not break, crush or chew.    ??? Drink plenty of water when taking this drug unless told to drink less water by your doctor. Drink 6 to 8 glasses (about 56 ounces total) of water each day. Start doing this 2 days before your first dose.   ??? Do not stop taking unless instructed to do so by your doctor.    ??? If you throw up after taking a dose, do not repeat the dose. Take your next dose at your normal time.  Adherence/Missed dose instructions: Take a missed dose as soon as you think about.  If it has been more than 8 hours since the missed dose, skip the missed dose and go back to your normal time. Do not take 2 doses at the same time or extra doses.  Goals of Therapy     ??? To prevent disease progression    Side Effects & Monitoring Parameters     Commonly reported side effects  ??? Abdominal pain   ??? Common cold symptoms  ??? Fatigue, loss of strength and energy  ??? Constipation  ??? Muscle pain, joint pain, back pain  ??? Mouth irritation, mouth sores  ??? Restlessness, difficulty seeping    The following side effects should be reported to the provider:  ??? Signs of infection (fever >100.4, chills, mouth sores, sputum production)  ??? Signs of bleeding (vomiting or coughing up blood, blood that looks like coffee grounds, blood in the urine or black, red tarry stools, bruising that gets bigger without reason, any persistent or severe bleeding, impaired wound healing)  ??? Signs of low blood sugar (dizziness, headache, fatigue, feeling weak, shaking, fast heartbeat, confusion, increased hunger or sweating)  ??? Signs of high blood sugar (confusion, fatigue increased thirst, increased hunger, passing a lot of urine, flushing, fast breathing or breath that smells like fruit)  ??? Signs of fluid and electrolyte problems (mood changes, confusion, muscle pain or weakness, abnormal/fast heartbeat, severe dizziness, passing out)  ??? Signs of tumor lysis syndrome (fast heartbeat or abnormal heartbeat; any passing out; unable to pass urine; muscle weakness or cramps; nausea, vomiting, diarrhea or lack of appetite; or feeling sluggish)  ??? Severe headache, dizziness, passing out  ??? Vision changes  ??? Severe loss of energy and strength  ??? Dark urine, urine discoloration, yellow eyes or skin  ??? Severe nausea, vomiting, inability to eat  ??? Severe diarrhea   ??? Shortness of breath  ??? Swelling  ??? Bruising  ??? Pale skin  ??? Signs  of anaphylaxis (wheezing, chest tightness, swelling of face, lips, tongue or throat)    Monitoring Parameters:   ??? CBC with differential (throughout treatment)  ??? Blood chemistries (potassium, uric acid, phosphorus, calcium, and creatinine)  ??? Pregnancy test (prior to treatment in females of reproductive potential)  ??? Assess tumor burden, including radiographic evaluation (eg, CT scan) for tumor lysis syndrome (TLS) risk evaluation;   ??? Monitor for signs/symptoms of infection.   ??? Monitor adherence.    Contraindications, Warnings, & Precautions   Contraindications:  ??? Concomitant use with strong CYP3A inhibitors at initiation and during ramp-up phase in patients with chronic lymphocytic leukemia/small lymphocytic lymphoma due to the potential for increased risk of tumor lysis syndrome.   Warnings & Precautions:   ??? Bone marrow suppression: Neutropenia, thrombocytopenia, and anemia may occur. Neutropenic fever has been reported both with monotherapy and combination therapy.   ??? Infection: Serious and fatal infections, including pneumonia and sepsis, have occurred.   ??? Tumor lysis syndrome: Tumor lysis syndrome including fatalities and renal failure requiring dialysis has occurred in patients with high tumor burden when treated with venetoclax.   ??? Hepatic impairment: Dosage adjustment recommended in patients with severe hepatic impairment. Monitor closely for toxicities.  ??? Multiple myeloma: In patients with relapsed or refractory multiple myeloma, an increase in mortality was noted when venetoclax was added to bortezomib and dexamethasone. Venetoclax is not approved for the treatment of multiple myeloma.  ??? Renal impairment: Patients with decreased renal function (CrCl <80 mL/minute) are at increased risk for TLS and may require more intensive TLS prophylaxis and monitoring during treatment initiation and dose escalation.  ??? Immunizations: Live vaccinations should not be administered prior to, during, or after venetoclax treatment until B-cell recovery occurs. Vaccines may be less effective.  ??? Reproductive Considerations: Females of reproductive potential should have a pregnancy test prior to therapy and use effective contraception during treatment and for at least 30 days after the final dose.  Based on the mechanism of action and data from animal reproduction studies, venetoclax is expected to cause fetal harm if administered during pregnancy.  ??? Breastfeeding considerations: It is not known if venetoclax is present in breast milk. Due to the potential for serious adverse reactions in the breastfed infant, breastfeeding is not recommended by the manufacturer.  Drug/Food Interactions     ??? Medication list reviewed in Epic. The patient was instructed to inform the care team before taking any new medications or supplements. Posaconazole may increase the serum concentration of Venetoclax. - venetoclax will be dose reduced one posaconazle is initiated.   ??? Avoid live vaccines. (venetoclax may diminish the therapeutic effects of live vaccines and enhance the toxic/adverse effects of live vaccines.  ??? Avoid grapefruit and grapefruit juice. (may increase serum concentrations of venetoclax)  ??? Avoid Seville oranges and star fruit. (May increase serum concentrations of venetoclax)    Storage, Handling Precautions, & Disposal   ??? Store at room temperature in the original container (do not use a pillbox or store with other medications).   ??? Caregivers helping administer medication should wear gloves and wash hands immediately after.    ??? Keep the lid tightly closed. Keep out of the reach of children and pets.  ??? Do not flush down a toilet or pour down a drain unless instructed to do so.  Check with your local police department or fire station about drug take-back programs in your area.      Current Medications (including OTC/herbals), Comorbidities and Allergies  Current Outpatient Medications   Medication Sig Dispense Refill   ??? allopurinol (ZYLOPRIM) 300 MG tablet Take 1 tablet (300 mg total) by mouth daily. 30 tablet 2   ??? hydroxyurea (HYDREA) 500 mg capsule Take 4 capsules (2,000 mg total) by mouth Two (2) times a day for 7 days. 56 capsule 0   ??? pantoprazole (PROTONIX) 40 MG tablet Take 1 tablet (40 mg total) by mouth Two (2) times a day. 60 tablet 0   ??? posaconazole (NOXAFIL) 100 mg delayed released tablet Take 3 tablets (300 mg total) by mouth daily. 90 tablet 2   ??? traZODone (DESYREL) 50 MG tablet Take 50 mg by mouth daily.     ??? valACYclovir (VALTREX) 500 MG tablet Take 500 mg by mouth daily.     ??? venetoclax (VENCLEXTA) 100 mg tablet Take 4 tablets (400 mg total) by mouth once daily. Take with a meal and water. Do not chew, crush, or break tablets. 120 tablet 0     No current facility-administered medications for this visit.       No Known Allergies    Patient Active Problem List   Diagnosis   ??? CMML (chronic myelomonocytic leukemia) (CMS-HCC)   ??? Hemorrhagic shock (CMS-HCC)   ??? Vasovagal syncope   ??? Hypertension   ??? Coronary artery disease without angina pectoris   ??? AKI (acute kidney injury) (CMS-HCC)   ??? Hematochezia   ??? Melena   ??? Hemorrhoid   ??? Anemia   ??? 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    Appropriateness of Therapy     Acute infections noted within Epic:  No active infections  Patient reported infection: None    Is medication and dose appropriate based on diagnosis and infection status? Yes    Prescription has been clinically reviewed: Yes      Baseline Quality of Life Assessment      How many days over the past month did your Acute myeloid leukemia   keep you from your normal activities? For example, brushing your teeth or getting up in the morning. 0    Financial Information     Medication Assistance provided: Prior Authorization    Anticipated copay of $9.85 / 30 days  reviewed with patient. Verified delivery address.    Delivery Information     Scheduled delivery date: 02/11/21    Expected start date: 02/14/21    Medication will be delivered via Next Day Courier to the prescription address in [facility].  This shipment will not require a signature.      Explained the services we provide at [facility] and that each month we would call to set up refills.  Stressed importance of returning phone calls so that we could ensure they receive their medications in time each month.  Informed patient that we should be setting up refills 7-10 days prior to when they will run out of medication.  A pharmacist will reach out to perform a clinical assessment periodically.  Informed patient that a welcome packet, containing information about our pharmacy and other support services, a Notice of Privacy Practices, and a drug information handout will be sent.      The patient or caregiver noted above participated in the development of this care plan and knows that they can request review of or adjustments to the care plan at any time.  Patient or caregiver verbalized understanding of the above information as well as how to contact the pharmacy at [phone] option 4 with any questions/concerns.  The pharmacy is open Monday through Friday 8:30am-4:30pm.  A pharmacist is available 24/7 via pager to answer any clinical questions they may have.    Patient Specific Needs     - Does the patient have any physical, cognitive, or cultural barriers? No    - Does the patient have adequate living arrangements? (i.e. the ability to store and take their medication appropriately) Yes    - Did you identify any home environmental safety or security hazards? No    - Patient prefers to have medications discussed with  daughter     - Is the patient or caregiver able to read and understand education materials at a high school level or above? Yes    - Patient's primary language is  English     - Is the patient high risk? Yes, patient is taking oral chemotherapy. Appropriateness of therapy as been assessed    SOCIAL DETERMINANTS OF HEALTH     At the [facility], we have learned that life circumstances - like trouble affording food, housing, utilities, or transportation can affect the health of many of our patients.   That is why we wanted to ask: are you currently experiencing any life circumstances that are negatively impacting your health and/or quality of life? Patient declined to answer    Social Determinants of Health     Food Insecurity: No Food Insecurity   ??? Worried About Programme researcher, broadcasting/film/video in the Last Year: Never true   ??? Ran Out of Food in the Last Year: Never true   Tobacco Use: Low Risk    ??? Smoking Tobacco Use: Never   ??? Smokeless Tobacco Use: Never   ??? Passive Exposure: Not on file   Transportation Needs: No Transportation Needs   ??? Lack of Transportation (Medical): No   ??? Lack of Transportation (Non-Medical): No   Alcohol Use: Not on file   Housing/Utilities: Low Risk    ??? Within the past 12 months, have you ever stayed: outside, in a car, in a tent, in an overnight shelter, or temporarily in someone else's home (i.e. couch-surfing)?: No   ??? Are you worried about losing your housing?: No   ??? Within the past 12 months, have you been unable to get utilities (heat, electricity) when it was really needed?: No   Substance Use: Not on file   Financial Resource Strain: Low Risk    ??? Difficulty of Paying Living Expenses: Not very hard   Physical Activity: Not on file   Health Literacy: Not on file   Stress: Not on file   Intimate Partner Violence: Not on file   Depression: Not on file   Social Connections: Not on file       Would you be willing to receive help with any of the needs that you have identified today? Not applicable       Ena Demary A [facility] Specialty Pharmacist

## 2021-02-11 ENCOUNTER — Other Ambulatory Visit: Admit: 2021-02-11 | Discharge: 2021-02-12 | Payer: MEDICARE

## 2021-02-11 ENCOUNTER — Encounter: Admit: 2021-02-11 | Discharge: 2021-02-12 | Payer: MEDICARE

## 2021-02-11 ENCOUNTER — Ambulatory Visit: Admit: 2021-02-11 | Discharge: 2021-02-12 | Payer: MEDICARE

## 2021-02-11 DIAGNOSIS — C9310 Chronic myelomonocytic leukemia not having achieved remission: Principal | ICD-10-CM

## 2021-02-11 DIAGNOSIS — C9200 Acute myeloblastic leukemia, not having achieved remission: Principal | ICD-10-CM

## 2021-02-11 LAB — CBC W/ AUTO DIFF
BASOPHILS ABSOLUTE COUNT: 0.1 10*9/L (ref 0.0–0.1)
BASOPHILS RELATIVE PERCENT: 1.1 %
EOSINOPHILS ABSOLUTE COUNT: 0.1 10*9/L (ref 0.0–0.5)
EOSINOPHILS RELATIVE PERCENT: 2 %
HEMATOCRIT: 26.8 % — ABNORMAL LOW (ref 39.0–48.0)
HEMOGLOBIN: 8.7 g/dL — ABNORMAL LOW (ref 12.9–16.5)
LYMPHOCYTES ABSOLUTE COUNT: 0.8 10*9/L — ABNORMAL LOW (ref 1.1–3.6)
LYMPHOCYTES RELATIVE PERCENT: 14.4 %
MEAN CORPUSCULAR HEMOGLOBIN CONC: 32.5 g/dL (ref 32.0–36.0)
MEAN CORPUSCULAR HEMOGLOBIN: 26.4 pg (ref 25.9–32.4)
MEAN CORPUSCULAR VOLUME: 81.1 fL (ref 77.6–95.7)
MEAN PLATELET VOLUME: 8.4 fL (ref 6.8–10.7)
MONOCYTES ABSOLUTE COUNT: 0.1 10*9/L — ABNORMAL LOW (ref 0.3–0.8)
MONOCYTES RELATIVE PERCENT: 1.2 %
NEUTROPHILS ABSOLUTE COUNT: 4.6 10*9/L (ref 1.8–7.8)
NEUTROPHILS RELATIVE PERCENT: 81.3 %
PLATELET COUNT: 183 10*9/L (ref 150–450)
RED BLOOD CELL COUNT: 3.31 10*12/L — ABNORMAL LOW (ref 4.26–5.60)
RED CELL DISTRIBUTION WIDTH: 18.6 % — ABNORMAL HIGH (ref 12.2–15.2)
WBC ADJUSTED: 5.6 10*9/L (ref 3.6–11.2)

## 2021-02-11 LAB — COMPREHENSIVE METABOLIC PANEL
ALBUMIN: 3.2 g/dL — ABNORMAL LOW (ref 3.4–5.0)
ALKALINE PHOSPHATASE: 76 U/L (ref 46–116)
ALT (SGPT): 7 U/L — ABNORMAL LOW (ref 10–49)
ANION GAP: 9 mmol/L (ref 5–14)
AST (SGOT): 12 U/L (ref <=34)
BILIRUBIN TOTAL: 0.5 mg/dL (ref 0.3–1.2)
BLOOD UREA NITROGEN: 9 mg/dL (ref 9–23)
BUN / CREAT RATIO: 10
CALCIUM: 8.6 mg/dL — ABNORMAL LOW (ref 8.7–10.4)
CHLORIDE: 104 mmol/L (ref 98–107)
CO2: 26 mmol/L (ref 20.0–31.0)
CREATININE: 0.88 mg/dL
EGFR CKD-EPI (2021) MALE: 86 mL/min/{1.73_m2} (ref >=60)
GLUCOSE RANDOM: 120 mg/dL (ref 70–179)
POTASSIUM: 3.7 mmol/L (ref 3.4–4.8)
PROTEIN TOTAL: 6.6 g/dL (ref 5.7–8.2)
SODIUM: 139 mmol/L (ref 135–145)

## 2021-02-11 NOTE — Unmapped (Unsigned)
PT in lab for PIV placement, 22 gauge placed in left forearm. Blood return brisk, saline locked. Placed by Raven RN

## 2021-02-11 NOTE — Unmapped (Signed)
Pt received in NAD. PIV accessed by triage nurse and positive blood return noted prior to initiation of treatment. Per transfusion plan, no transfusion indicated. PIV flushed and deacessed. Patient discharged ambulatory in NAD.

## 2021-02-11 NOTE — Unmapped (Signed)
[employer] Navigation Program Pre-Treatment Assessment:    Summary: Oncology Patient Navigator (OPN) completed Pre-Treatment Assessment to introduce central navigation services and identify immediate non-medical barriers/needs. Call received by pt's dtr.    Readiness for Treatment: Patient understands treatment plan and knows how to contact medical personnel   MyChart user: Yes   Transportation issues: Yes - pt's dtr acknowledges the costs that accrues from gas/parking; OPN applied for assistance through [employer] gas card program  Positive support system: Yes  Need for Financial Assistance: Yes; after pt's dtr shared pt's monthly income, OPN provided information on funds available through [employer]. Per dtr's request, OPN applied and was approved for funding through the Patient Aid ($100), Urgent Need ($500), and Co-Pay Assistance Program ($10,000).    Risk factors: Age > or = 65, 2 or more co-morbidities  and Black/African American  Navigation Assessment Score: Pre-Treatment Assessment completed. Active Treatment Assessment will occur in the coming weeks.    OPN provided contact information for patient to utilize as needed. Following initiation of treatment, OPN will follow and complete an Active Treatment Assessment, support, and resourcing.

## 2021-02-14 ENCOUNTER — Ambulatory Visit: Admit: 2021-02-14 | Discharge: 2021-02-15 | Payer: MEDICARE

## 2021-02-14 ENCOUNTER — Other Ambulatory Visit: Admit: 2021-02-14 | Discharge: 2021-02-15 | Payer: MEDICARE

## 2021-02-14 ENCOUNTER — Inpatient Hospital Stay: Payer: Medicare Other

## 2021-02-14 ENCOUNTER — Inpatient Hospital Stay: Payer: Medicare Other | Admitting: Nurse Practitioner

## 2021-02-14 DIAGNOSIS — C9200 Acute myeloblastic leukemia, not having achieved remission: Principal | ICD-10-CM

## 2021-02-14 DIAGNOSIS — Z5111 Encounter for antineoplastic chemotherapy: Secondary | ICD-10-CM | POA: Diagnosis not present

## 2021-02-14 LAB — CBC W/ AUTO DIFF
BASOPHILS ABSOLUTE COUNT: 0 10*9/L (ref 0.0–0.1)
BASOPHILS RELATIVE PERCENT: 1.7 %
EOSINOPHILS ABSOLUTE COUNT: 0 10*9/L (ref 0.0–0.5)
EOSINOPHILS RELATIVE PERCENT: 2.7 %
HEMATOCRIT: 25.9 % — ABNORMAL LOW (ref 39.0–48.0)
HEMOGLOBIN: 8.5 g/dL — ABNORMAL LOW (ref 12.9–16.5)
LYMPHOCYTES ABSOLUTE COUNT: 0.4 10*9/L — ABNORMAL LOW (ref 1.1–3.6)
LYMPHOCYTES RELATIVE PERCENT: 31.4 %
MEAN CORPUSCULAR HEMOGLOBIN CONC: 32.8 g/dL (ref 32.0–36.0)
MEAN CORPUSCULAR HEMOGLOBIN: 27.6 pg (ref 25.9–32.4)
MEAN CORPUSCULAR VOLUME: 84 fL (ref 77.6–95.7)
MEAN PLATELET VOLUME: 7.8 fL (ref 6.8–10.7)
MONOCYTES ABSOLUTE COUNT: 0.1 10*9/L — ABNORMAL LOW (ref 0.3–0.8)
MONOCYTES RELATIVE PERCENT: 5 %
NEUTROPHILS ABSOLUTE COUNT: 0.8 10*9/L — ABNORMAL LOW (ref 1.8–7.8)
NEUTROPHILS RELATIVE PERCENT: 59.2 %
PLATELET COUNT: 218 10*9/L (ref 150–450)
RED BLOOD CELL COUNT: 3.09 10*12/L — ABNORMAL LOW (ref 4.26–5.60)
RED CELL DISTRIBUTION WIDTH: 19.2 % — ABNORMAL HIGH (ref 12.2–15.2)
WBC ADJUSTED: 1.4 10*9/L — ABNORMAL LOW (ref 3.6–11.2)

## 2021-02-14 LAB — COMPREHENSIVE METABOLIC PANEL
ALBUMIN: 3 g/dL — ABNORMAL LOW (ref 3.4–5.0)
ALKALINE PHOSPHATASE: 74 U/L (ref 46–116)
ALT (SGPT): <7 U/L — ABNORMAL LOW (ref 10–49)
ANION GAP: 7 mmol/L (ref 5–14)
AST (SGOT): 14 U/L (ref <=34)
BILIRUBIN TOTAL: 0.4 mg/dL (ref 0.3–1.2)
BLOOD UREA NITROGEN: 10 mg/dL (ref 9–23)
BUN / CREAT RATIO: 12
CALCIUM: 8.6 mg/dL — ABNORMAL LOW (ref 8.7–10.4)
CHLORIDE: 105 mmol/L (ref 98–107)
CO2: 29 mmol/L (ref 20.0–31.0)
CREATININE: 0.85 mg/dL
EGFR CKD-EPI (2021) MALE: 87 mL/min/{1.73_m2} (ref >=60)
GLUCOSE RANDOM: 98 mg/dL (ref 70–179)
POTASSIUM: 3.8 mmol/L (ref 3.4–4.8)
PROTEIN TOTAL: 6.5 g/dL (ref 5.7–8.2)
SODIUM: 141 mmol/L (ref 135–145)

## 2021-02-14 LAB — URIC ACID: URIC ACID: 6 mg/dL

## 2021-02-14 LAB — MAGNESIUM: MAGNESIUM: 1.7 mg/dL (ref 1.6–2.6)

## 2021-02-14 LAB — LACTATE DEHYDROGENASE: LACTATE DEHYDROGENASE: 244 U/L (ref 120–246)

## 2021-02-14 LAB — PHOSPHORUS: PHOSPHORUS: 2.7 mg/dL (ref 2.4–5.1)

## 2021-02-14 MED ORDER — PROCHLORPERAZINE MALEATE 10 MG TABLET
ORAL_TABLET | Freq: Four times a day (QID) | ORAL | 2 refills | 8 days | Status: CP | PRN
Start: 2021-02-14 — End: ?

## 2021-02-14 MED ORDER — LEVOFLOXACIN 500 MG TABLET
ORAL_TABLET | Freq: Every day | ORAL | 3 refills | 30 days | Status: CP
Start: 2021-02-14 — End: 2022-02-14

## 2021-02-14 MED ADMIN — ondansetron (ZOFRAN) tablet 8 mg: 8 mg | ORAL | @ 18:00:00 | Stop: 2021-02-14

## 2021-02-14 MED ADMIN — azaCITIDine (VIDAZA) syringe: 75 mg/m2 | SUBCUTANEOUS | @ 19:00:00 | Stop: 2021-02-14

## 2021-02-14 MED ADMIN — sodium chloride 0.9% (NS) bolus 500 mL: 500 mL | INTRAVENOUS | @ 18:00:00 | Stop: 2021-02-14

## 2021-02-14 NOTE — Unmapped (Signed)
Leukemia Clinic Follow Up    Patient Name: Gary Baird  Patient Age: 80 y.o.  Encounter Date: 02/14/2021    Primary Care Provider:  [facility]    Referring Physician:  [facility]    Reason for visit:  Follow up for AML    Assessment:  Mr.  Larrell Rapozo is a  80 y.o. 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 He was on monthly azacitidine for two years (since 09/2018) for management of his CMML, last given 12/30/20. He developed new GI bleeding and worsening leukocytosis and was admitted to [facility] for management on 01/29/21. Inpatient BMbx identified AML transformation with 53% blasts, normal cytogenetics, and mutations in ASXL1, SRSF2, and TET2.  He was started on hydrea for cytoreduction. He presents to clinic today to initiate treatment on aza/ven.     I have reviewed the treatment regimen and schedule of visits for this first cycle.  He will be receiving azacitine 75mg /m2 x 7 days + venetoclax 400mg  x 28 days.  Given the complexity of this regimen he will be seen in clinic on a weekly basis for cycle 1.  We will also require twice weekly lab checks for this first cycle.  I have reviewed the side effects of this treatment to include, but not limited to, fatigue, n/v/d, loss of appetite, constipation, and myelosuppression.  I plan to have him stop Hydrea at this time.  His ANC is 0.8 today, but anticipate that it will continue to drop with the addition of aza/ven.  Will proceed with starting ppx levaquin 500mg  daily. He should continue with allopurinol and valtrex.  Will hold on starting posaconazole until day 8, patient does have drug in hand.  No transfusions required today.  Will plan to see him in clinic in 1 week.         Plan:  - proceed with cycle 1 azacitidine 75mg /m2 x 7 days + venetoclax 400mg  x 28 days with dose ramp up  - stop Hydrea  - continue allopurinol and valtrex  - start levofloxacin 500mg  daily  - 2x/week lab monitoring  - weekly clinic visits for cycle 1  - BMbx 1/3      Dr. Vertell Limber was available    Arna Medici, AGNP-BC  Leukemia Research Nurse Practitioner  Hematology/Oncology Division  [facility]  02/14/2021     I personally spent 50 minutes face-to-face and non-face-to-face in the care of this patient, which includes all pre, intra, and post visit time on the date of service.  All documented time was specific to the E/M visit and does not include any procedures that may have been performed.    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)    Presentation: Incidentally found to have elevated WBC prior to procedure    Labs- WBC = 25.2  Hb = 8.2 g/dL  Platelets = 161  09% monocytes    Bone marrow biopsy- 09/04/2018  Hypercellular marrow- 70-90% cellular, 7% blasts by manual aspirate differential- monocytic appearance, granulocytic dysplasia, dysplasia of megakaryocytes    Final Diagnosis   Date Value Ref Range Status   09/04/2018   Final    (Outside Case #:  [id number], dated 09/04/2018)  Bone marrow,  aspiration and biopsy  -  Hypercellular bone marrow (90%) involved by chronic myelomonocytic leukemia-1 (9% blasts by manual aspirate differential) (see Comment)  -  Unifocal paratrabecular  cluster of atypical CD17-positive cells (see Comment)  -  By outside report, cytogenetic analysis reveals a normal karyotype.  -  By outside report, next generation sequencing for myeloid disorders reveals pathogenic or likely pathogenic variants in CBL (p.C384Y), SRSF2 (p.P95H), TET2 (Z.[id number]*40), and SH2B3 (J.[id number]*82).    Peripheral blood, smear review  -  Leukocytosis with absolute monocytosis  -  Normocytic anemia  -  Thrombocytosis    This electronic signature is attestation that the pathologist personally reviewed the submitted material(s) and the final diagnosis reflects that evaluation.         Flow cytometry: No myeloblasts detectable, 16% monocytes- HLA-DR, CD11b, CD13, CD14, CD33, CD38, CD56, CD64 and without CD34 expression     Cytogenetics = Normal, 46,XY    Mutational Panel:   CBL C384Y- 19.0% VAF  SH2B3 S256Qfs- 58.8% VAF  SRSF2 P95H- 47.1% VAF  TET2 R1440- 86% VAF    Treatment: Azacitidine 75mg /m2 daily for 5 days monthly (09/30/18--12/31/20)        2. Diagnosis: Secondary AML     Developed hematochezia, fatigue, blurry vision, DOE at end of Nov 2022.  WBC 136, Hb 5.4, Pl 313, peripheral blasts 8%      Diagnosis   Date Value Ref Range Status   01/31/2021   Final    Bone marrow, right iliac, aspiration and biopsy  -  Hypercellular bone marrow (90%) consistent with involvement by acute myeloid leukemia (53% blasts by manual aspirate differential)  -  See linked reports for associated Ancillary Studies.      This electronic signature is attestation that the pathologist personally reviewed the submitted material(s) and the final diagnosis reflects that evaluation.          RESULTS   Date Value Ref Range Status   01/31/2021   Preliminary    Normal Karyotype: 46,XY[10]    Normal FISH:  An interphase FISH assay shows no evidence of a rearrangement involving the KMT2A (MLL) gene region in the 200 nuclei scored.          Myeloid Panel:   Variants of Known/Likely Clinical Significance:   Gene Coding Predicted Protein Variant allele fraction   ASXL1 c.2158del p.(Asp720Thrfs*5) 42.7 %   SRSF2 c.284C>A p.(Pro95His) 45.5 %   TET2 c.4317dupA p.(Arg1440Thrfs*38) 90.4 %        CMML (chronic myelomonocytic leukemia) (CMS-HCC)   09/19/2018 Initial Diagnosis    CMML (chronic myelomonocytic leukemia) (CMS-HCC)     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)   02/04/2021 Initial Diagnosis    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)     02/14/2021 -  Chemotherapy    OP AML AZACITIDINE + VENETOCLAX  azacitidine 75 mg/m2 SQ on days 1-7, venetoclax ramp up Week 1 (dose dependent), then azacitidine 75 mg/m2 SQ on Days 1-7 every 28 days      Chemotherapy    OP AML AZACITIDINE + VENETOCLAX      azacitidine 75 mg/m2 SQ on days 1-7, venetoclax ramp up Week 1 (dose dependent), then azacitidine 75 mg/m2 SQ on Days 1-7 every 28 days         INTERVAL HISTORY:  Since last seen Mr. Marcussen reports that he has been feeling pretty good.  Overall he feels like his energy is better since he was discharged.  His appetite is good.  He denies any n/v/d.  No new cough or sob.  No new fevers/chills.     Otherwise, he denies new constitutional symptoms such as anorexia, weight  loss, fatigue, night sweats or unexplained fevers.  Furthermore, he denies unexplained bleeding or bruising, recurrent or unexplained intercurrent infections, dyspnea on exertion, lightheadedness, palpitations or chest pain.  There have been no new or unexplained pains or self-identified masses, swelling or enlarged lymph nodes.    PAST MEDICAL HISTORY:  Past Medical History:   Diagnosis Date   ??? Cancer (CMS-HCC)    ??? Hypertension        MEDICATIONS:  Current Outpatient Medications   Medication Sig Dispense Refill   ??? allopurinol (ZYLOPRIM) 300 MG tablet Take 1 tablet (300 mg total) by mouth daily. 30 tablet 2   ??? hydroxyurea (HYDREA) 500 mg capsule Take 4 capsules (2,000 mg total) by mouth Two (2) times a day for 7 days. 56 capsule 0   ??? pantoprazole (PROTONIX) 40 MG tablet Take 1 tablet (40 mg total) by mouth Two (2) times a day. 60 tablet 0   ??? posaconazole (NOXAFIL) 100 mg delayed released tablet Take 3 tablets (300 mg total) by mouth once daily. 90 tablet 2   ??? traZODone (DESYREL) 50 MG tablet Take 50 mg by mouth daily.     ??? valACYclovir (VALTREX) 500 MG tablet Take 500 mg by mouth daily.     ??? venetoclax (VENCLEXTA) 100 mg tablet Take 4 tablets (400 mg total) by mouth once daily. Take with a meal and water. Do not chew, crush, or break tablets. 120 tablet 0     No current facility-administered medications for this visit.     ALLERGIES:  No Known Allergies    SOCIAL HISTORY:  Social History     Social History Narrative   ??? Not on file          FAMILY HISTORY:  Family History   Problem Relation Age of Onset   ??? Alzheimer's disease Father    ??? Melanoma Neg Hx    ??? Basal cell carcinoma Neg Hx    ??? Squamous cell carcinoma Neg Hx        REVIEW OF SYSTEMS:  All other systems reviewed were negative    ECOG: 1    PHYSICAL EXAMINATION:  Vitals - 1 value per visit 02/14/2021   BP 117/64   Pulse 93   Temp 98.1   Resp 18   Weight (lb) 313 lbs   Weight (kg) 141.976 kg     Physical Exam:  General: Resting, in no apparent distress  HEENT:  PERRL. No scleral icterus or conjunctival injection.   Heart:  RRR.  S1, S2.  No murmurs, gallops or rubs. No edema noted  Lungs:  Breathing is unlabored, and patient is speaking full sentences with ease.  CTAB. No rales, ronchi or crackles.    Abdomen:  No distention or pain on palpation.  Bowel sounds are present. No palpable masses.  Skin:  No rashes, petechiae or purpura. Grossly intact.  Musculoskeletal: Range of motion about the shoulder, elbow, hips and knees is grossly normal.  Strength equal bilaterally.   Psychiatric:  Range of affect is appropriate.    Neurologic:  Alert and oriented x 4.  Steady gait.    LABORATORY DATA:  Lab on 02/14/2021   Component Date Value Ref Range Status   ??? Sodium 02/14/2021 141  135 - 145 mmol/L Final   ??? Potassium 02/14/2021 3.8  3.4 - 4.8 mmol/L Final   ??? Chloride 02/14/2021 105  98 - 107 mmol/L Final   ??? CO2 02/14/2021 29.0  20.0 - 31.0 mmol/L Final   ???  Anion Gap 02/14/2021 7  5 - 14 mmol/L Final   ??? BUN 02/14/2021 10  9 - 23 mg/dL Final   ??? Creatinine 02/14/2021 0.85  0.60 - 1.10 mg/dL Final   ??? BUN/Creatinine Ratio 02/14/2021 12   Final   ??? eGFR CKD-EPI (2021) Male 02/14/2021 87  >=60 mL/min/1.27m2 Final    eGFR calculated with CKD-EPI 2021 equation in accordance with [employer] and [employer] of Nephrology Task Force recommendations.   ??? Glucose 02/14/2021 98  70 - 179 mg/dL Final   ??? Calcium 19/14/7829 8.6 (L)  8.7 - 10.4 mg/dL Final   ??? Albumin 56/21/3086 3.0 (L)  3.4 - 5.0 g/dL Final   ??? Total Protein 02/14/2021 6.5  5.7 - 8.2 g/dL Final   ??? Total Bilirubin 02/14/2021 0.4  0.3 - 1.2 mg/dL Final   ??? AST 57/84/6962 14  <=34 U/L Final   ??? ALT 02/14/2021 <7 (L)  10 - 49 U/L Final   ??? Alkaline Phosphatase 02/14/2021 74  46 - 116 U/L Final   ??? Magnesium 02/14/2021 1.7  1.6 - 2.6 mg/dL Final   ??? Phosphorus 02/14/2021 2.7  2.4 - 5.1 mg/dL Final   ??? LDH 02/14/2021 244  120 - 246 U/L Final   ??? Uric Acid 02/14/2021 6.0  3.7 - 9.2 mg/dL Final   ??? WBC 95/28/4132 1.4 (L)  3.6 - 11.2 10*9/L Final   ??? RBC 02/14/2021 3.09 (L)  4.26 - 5.60 10*12/L Final   ??? HGB 02/14/2021 8.5 (L)  12.9 - 16.5 g/dL Final   ??? HCT 44/03/270 25.9 (L)  39.0 - 48.0 % Final   ??? MCV 02/14/2021 84.0  77.6 - 95.7 fL Final   ??? MCH 02/14/2021 27.6  25.9 - 32.4 pg Final   ??? MCHC 02/14/2021 32.8  32.0 - 36.0 g/dL Final   ??? RDW 53/66/4403 19.2 (H)  12.2 - 15.2 % Final   ??? MPV 02/14/2021 7.8  6.8 - 10.7 fL Final   ??? Platelet 02/14/2021 218  150 - 450 10*9/L Final   ??? Neutrophils % 02/14/2021 59.2  % Final   ??? Lymphocytes % 02/14/2021 31.4  % Final   ??? Monocytes % 02/14/2021 5.0  % Final   ??? Eosinophils % 02/14/2021 2.7  % Final   ??? Basophils % 02/14/2021 1.7  % Final   ??? Absolute Neutrophils 02/14/2021 0.8 (L)  1.8 - 7.8 10*9/L Final   ??? Absolute Lymphocytes 02/14/2021 0.4 (L)  1.1 - 3.6 10*9/L Final   ??? Absolute Monocytes 02/14/2021 0.1 (L)  0.3 - 0.8 10*9/L Final   ??? Absolute Eosinophils 02/14/2021 0.0  0.0 - 0.5 10*9/L Final   ??? Absolute Basophils 02/14/2021 0.0  0.0 - 0.1 10*9/L Final   ??? Anisocytosis 02/14/2021 Moderate (A)  Not Present Final

## 2021-02-14 NOTE — Unmapped (Signed)
Pt to clinic for treatment.  C1D1 Aza administered to RLQ, pt tolerated well.  NS bolus over x2 hrs.  PIV removed.  Pt left via wheelchair with daughter.

## 2021-02-14 NOTE — Unmapped (Signed)
PIV obtained and blood specimen's sent to core lab for processing. Antoinetta Berrones,RN

## 2021-02-14 NOTE — Unmapped (Signed)
Great speaking with you regarding your new chemotherapy regimen! Here's a summary of what we discussed:    You will be receiving 2 types of chemotherapy as part of this regimen. One type is an injection, called azacitidine. You will receive this as a shot for 7 straight days in our infusion center. They will give you some anti-nausea medication each day prior to your injection, called Zofran. This can sometimes cause constipation, so feel free to add Miralax to your regimen if needed.    The other chemotherapy called venetoclax is in a tablet form that you will take at home. I pasted a handout below. You will take this everyday at home WITH FOOD. In order to prevent something called tumor lysis syndrome, we will do this in a ramp-up as follows:  Day 1 = 1 tablet  Day 2 = 2 tablets  Day 3 and beyond = 4 tablets    We may start a preventative antifungal in the future. This is often something called posaconazole (Noxafil). This INTERACTS with your venetoclax, so we will ask you to dose reduce the venetoclax to just 1 tablet daily when the antifungal medication is started. If you receive the posaconazole in the mail, please do NOT start it until we discuss this dose change. More to come    Continue other infection prevention medications including levofloxacin and valtrex once daily.     Continue allopurinol daily and try to stay hydrated (aim for drinking 6-8 glasses of water per day). We will also give you some IV fluids in the infusion center several days of the first week of chemo. Allopurinol and hydrating helps to prevent tumor lysis syndrome.     You will get calls from our team or have visits in clinic throughout your first cycle to check in. You will also need labs at least twice weekly after the first week so we can monitor for transfusion needs. Keep an eye on MyChart and your visit summaries regarding appointments.     Venetoclax (Venclexta??) Chemotherapy    This chemotherapy regimen is used to treat acute myeloid leukemia. Venetoclax is an oral tablet that will be taken by mouth every day. You will start this medication following review of labs in clinic on Day 1. You will start on the same day you begin azacitidine injections. Your cancer care team will let you know when to change the dose of the medication.     Drug Name How is it given? On what day?   Venetoclax (Venclexta??)   100 mg (1 x 100 mg tablet) by mouth daily with a meal   Day 1      200 mg (2 x 100 mg tablet) by mouth daily with a meal   Day 2      400 mg (4 x 100 mg tablets) by mouth daily with a meal   Day 3 and beyond        HOW THE TREATMENT IS GIVEN  Venetoclax will be started in the outpatient clinic of the [facility]. It is important to continue taking the medication every day with a meal and a full glass of water, drink at least 6 glasses of water per day for the first 5 weeks, and let your cancer care team know immediately if you experience anything out of the ordinary.     While in the infusion clinic your cancer care team will provide you with fluids to make sure you stay hydrated. Before you start chemotherapy, you  will also be given a medication to prevent a side effect of the chemotherapy called tumor lysis syndrome (TLS). TLS is when the cancer cells die and the contents of the cancer cells spill out into the blood stream, which can make you sick. The medication to prevent TLS is called allopurinol (Zyloprim??). You should take a daily dose of allopurinol by mouth every day, starting at least 3 days before you start venetoclax.     Chemotherapy can interact with other drugs, vitamins, or herbal products.  Let your physician or pharmacist know your current medication list and any new medications you may start during therapy, so that they may check for potential drug interactions.      SIDE EFFECTS             Not everyone who gets the same chemotherapy responds the same way.  Some people have very few side effects while others may experience more.  If you do experience side effects, it important to tell your health care provider.  The most common side effects are listed below.  Side effects you may experience during or immediately after starting Venetoclax:     Nausea, upset stomach, and vomiting: You may be given medications to take at home to prevent and treat nausea.  The medications may include one or more of the following:  Prochloroperazine (Compazine??)  Ondansetron (Zofran??)    Tumor lysis syndrome (TLS): When your cancer cells die, the contents of your cancer cells spill into your blood stream. This can cause the following blood chemistry changes which can make you sick:    Increase in potassium levels (hyperkalemia): You may experience weakness, fatigue, or abnormal heart rhythms. Let your cancer care team know immediately if you experience these symptoms.     Increase in phosphate levels (hyperphosphatemia): You may experience spasms, cramping, or numbness. Let your cancer care team know immediately if you experience these symptoms.     Increase in uric acid levels (hyperuricemia): You may have problems with urination which could include painful urination or developing blood in the urine. You may also develop pain in the stomach or lower back area. Let your cancer care team know immediately if you experience these symptoms.     Decrease in calcium levels (hypocalcemia): You may experience spasms, cramping, or numbness. Let your cancer care team know immediately if you experience these symptoms.       Side effects you may experience several days to weeks after starting Venetoclax:    Increased risk of infection: White blood cells, produced by your bone marrow, are responsible for fighting infection.  This chemotherapy may decrease the number of these cells causing you to be more at risk for developing an infection. To reduce your risk for infection, wash your hands often with soap and water and avoid large groups of people or people who are sick or have colds.        Make sure you have an oral thermometer at home and see your doctor or go to the closest emergency room right away if:  Your temperature equals or goes above 100.5??F (38??C)   You suddenly feel unwell (even with a normal temperature)    Do not take over-the-counter fever-reducing medications such as acetaminophen (Tylenol??), ibuprofen (Advil??) or aspirin unless instructed by your doctor.    Decreased red blood cells (anemia):  Your bone marrow also produces red blood cells that carry oxygen throughout your body.  This chemotherapy can reduce the number of red blood  cells which can make you feel tired or weak.  Let your doctor know if you have shortness of breath, dizziness, or difficulty breathing as this may be a sign that your red blood cells are low.  If the count becomes too low, you may be given a blood transfusion.  Increased bruising or bleeding:  In addition to red and white blood cells, your bone marrow also produces cells called platelets.  Platelets help your blood to clot.  Chemotherapy can reduce the production of platelets, putting you at higher risk for bleeding.  To decrease this risk, use an electric razor for shaving, a soft bristle tooth brush, avoid playing contact sports, and avoid taking aspirin, ibuprofen (Advil??), or naproxen (Aleve??) without talking to your cancer care team first.   Feeling tired, run down:  This is a very common side effect during chemotherapy and may be worse after you have had several cycles of treatment.  Light exercise and staying well hydrated (with water or drinks like diluted Gatorade or Pedialyte) may help to improve energy, appetite, and quality of life.  You may also need to take short naps throughout the day.  Keep your health professionals informed about how you are feeling, especially if you feel that you are not able to do what you enjoy doing the most.      Reproductive Issues:    Pregnancy:  Chemotherapy can have harmful and potentially deadly effects on unborn children. Therefore you should use appropriate birth control methods while you are receiving chemotherapy and for several months after.  Your doctor can recommend appropriate birth control methods, though barrier methods, such as condoms, are preferred.    This education material is to supplement and support, but not replace, the conversation you have with your oncologist or cancer care team.

## 2021-02-14 NOTE — Unmapped (Addendum)
Today we are going to start your first cycle of treatment.     You will be here for the next 7 days getting injections, azacitidine  You will start the oral medication, venetoclax as well.     I want you to STOP the hydroxyurea  I would like for you to start Levofloxacin 500mg  daily  I want  you to continue with allopurinol and valacyclovir    Lab on 02/14/2021   Component Date Value Ref Range Status    Sodium 02/14/2021 141  135 - 145 mmol/L Final    Potassium 02/14/2021 3.8  3.4 - 4.8 mmol/L Final    Chloride 02/14/2021 105  98 - 107 mmol/L Final    CO2 02/14/2021 29.0  20.0 - 31.0 mmol/L Final    Anion Gap 02/14/2021 7  5 - 14 mmol/L Final    BUN 02/14/2021 10  9 - 23 mg/dL Final    Creatinine 16/12/9602 0.85  0.60 - 1.10 mg/dL Final    BUN/Creatinine Ratio 02/14/2021 12   Final    eGFR CKD-EPI (2021) Male 02/14/2021 87  >=60 mL/min/1.50m2 Final    eGFR calculated with CKD-EPI 2021 equation in accordance with [employer] and [employer] of Nephrology Task Force recommendations.    Glucose 02/14/2021 98  70 - 179 mg/dL Final    Calcium 54/11/8117 8.6 (L)  8.7 - 10.4 mg/dL Final    Albumin 14/78/2956 3.0 (L)  3.4 - 5.0 g/dL Final    Total Protein 02/14/2021 6.5  5.7 - 8.2 g/dL Final    Total Bilirubin 02/14/2021 0.4  0.3 - 1.2 mg/dL Final    AST 21/30/8657 14  <=34 U/L Final    ALT 02/14/2021 <7 (L)  10 - 49 U/L Final    Alkaline Phosphatase 02/14/2021 74  46 - 116 U/L Final    Magnesium 02/14/2021 1.7  1.6 - 2.6 mg/dL Final    Phosphorus 84/69/6295 2.7  2.4 - 5.1 mg/dL Final    LDH 28/41/3244 244  120 - 246 U/L Final    Uric Acid 02/14/2021 6.0  3.7 - 9.2 mg/dL Final    WBC 03/08/7251 1.4 (L)  3.6 - 11.2 10*9/L Final    RBC 02/14/2021 3.09 (L)  4.26 - 5.60 10*12/L Final    HGB 02/14/2021 8.5 (L)  12.9 - 16.5 g/dL Final    HCT 66/44/0347 25.9 (L)  39.0 - 48.0 % Final    MCV 02/14/2021 84.0  77.6 - 95.7 fL Final    MCH 02/14/2021 27.6  25.9 - 32.4 pg Final    MCHC 02/14/2021 32.8  32.0 - 36.0 g/dL Final    RDW 42/59/5638 19.2 (H)  12.2 - 15.2 % Final    MPV 02/14/2021 7.8  6.8 - 10.7 fL Final    Platelet 02/14/2021 218  150 - 450 10*9/L Final    Neutrophils % 02/14/2021 59.2  % Final    Lymphocytes % 02/14/2021 31.4  % Final    Monocytes % 02/14/2021 5.0  % Final    Eosinophils % 02/14/2021 2.7  % Final    Basophils % 02/14/2021 1.7  % Final    Absolute Neutrophils 02/14/2021 0.8 (L)  1.8 - 7.8 10*9/L Final    Absolute Lymphocytes 02/14/2021 0.4 (L)  1.1 - 3.6 10*9/L Final    Absolute Monocytes 02/14/2021 0.1 (L)  0.3 - 0.8 10*9/L Final    Absolute Eosinophils 02/14/2021 0.0  0.0 - 0.5 10*9/L Final    Absolute Basophils 02/14/2021  0.0  0.0 - 0.1 10*9/L Final    Anisocytosis 02/14/2021 Moderate (A)  Not Present Final

## 2021-02-14 NOTE — Unmapped (Signed)
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 He reports feeling overall well, and states his energy and appetite are slowly improving. He denies any symptoms of infection (fevers at home, cough, or SOB), and has not noted any signs of internal or external bleeding. He received both posaconazole and venetoclax in the mail, and has appropriately held both until our visit today. Has been on allopurinol and valacyclovir since the start of his CMML therapy, and has continued to take both once daily. He previously received azacitidine for treatment of his CMML, which he tolerated well. No additional complaints at this time, though he does state that he has not been sleeping well at night. Per his medication list, has trazodone PRN available to him, but he states this is ineffective. Over the last week, he's been using ZzzQuil (diphenhydramine) each night to help him sleep. We reviewed all education for??azacitidine and??venetoclax, ramp-up/dosing, administration, adverse effects, TLS prophylaxis, infection. prophylaxis, and plan outlined below.??    On labs: WBC 1.4, Hgb 8.5, PLT 218, ANC 0.8. CMP WNL.   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)    Presentation: Incidentally found to have elevated WBC prior to procedure    Labs- WBC = 25.2  Hb = 8.2 g/dL  Platelets = 161  09% monocytes    Bone marrow biopsy- 09/04/2018  Hypercellular marrow- 70-90% cellular, 7% blasts by manual aspirate differential- monocytic appearance, granulocytic dysplasia, dysplasia of megakaryocytes    Final Diagnosis   Date Value Ref Range Status   09/04/2018   Final    (Outside Case #:  [id number], dated 09/04/2018)  Bone marrow,  aspiration and biopsy  -  Hypercellular bone marrow (90%) involved by chronic myelomonocytic leukemia-1 (9% blasts by manual aspirate differential) (see Comment)  -  Unifocal paratrabecular cluster of atypical CD17-positive cells (see Comment)  -  By outside report, cytogenetic analysis reveals a normal karyotype.  -  By outside report, next generation sequencing for myeloid disorders reveals pathogenic or likely pathogenic variants in CBL (p.C384Y), SRSF2 (p.P95H), TET2 (J.Y7829FAO*13), and SH2B3 (Y.Q657QIO*96).    Peripheral blood, smear review  -  Leukocytosis with absolute monocytosis  -  Normocytic anemia  -  Thrombocytosis    This electronic signature is attestation that the pathologist personally reviewed the submitted material(s) and the final diagnosis reflects that evaluation.         Flow cytometry: No myeloblasts detectable, 16% monocytes- HLA-DR, CD11b, CD13, CD14, CD33, CD38, CD56, CD64 and without CD34 expression     Cytogenetics = Normal, 46,XY    Mutational Panel:   CBL C384Y- 19.0% VAF  SH2B3 S256Qfs- 58.8% VAF  SRSF2 P95H- 47.1% VAF  TET2 R1440- 86% VAF    Treatment: Azacitidine 75mg /m2 daily for 5 days monthly (09/30/18--12/31/20)        2. Diagnosis: Secondary AML     Developed hematochezia, fatigue, blurry vision,  DOE at end of Nov 2022.  WBC 136, Hb 5.4, Pl 313, peripheral blasts 8%      Diagnosis   Date Value Ref Range Status   01/31/2021   Final    Bone marrow, right iliac, aspiration and biopsy  -  Hypercellular bone marrow (90%) consistent with involvement by acute myeloid leukemia (53% blasts by manual aspirate differential)  -  See linked reports for associated Ancillary Studies.      This electronic signature is attestation that the pathologist personally reviewed the submitted material(s) and the final diagnosis reflects that evaluation.          RESULTS   Date Value Ref Range Status   01/31/2021   Preliminary    Normal Karyotype: 46,XY[10]    Normal FISH:  An interphase FISH assay shows no evidence of a rearrangement involving the KMT2A (MLL) gene region in the 200 nuclei scored.          Myeloid Panel:   Variants of Known/Likely Clinical Significance:   Gene Coding Predicted Protein Variant allele fraction   ASXL1 c.2158del p.(Asp720Thrfs*5) 42.7 %   SRSF2 c.284C>A p.(Pro95His) 45.5 %   TET2 c.4317dupA p.(Arg1440Thrfs*38) 90.4 %        CMML (chronic myelomonocytic leukemia) (CMS-HCC)   09/19/2018 Initial Diagnosis    CMML (chronic myelomonocytic leukemia) (CMS-HCC)     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)   02/04/2021 Initial Diagnosis    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)     02/14/2021 -  Chemotherapy    OP AML AZACITIDINE + VENETOCLAX  azacitidine 75 mg/m2 SQ on days 1-7, venetoclax ramp up Week 1 (dose dependent), then azacitidine 75 mg/m2 SQ on Days 1-7 every 28 days      Chemotherapy    OP AML AZACITIDINE + VENETOCLAX      azacitidine 75 mg/m2 SQ on days 1-7, venetoclax ramp up Week 1 (dose dependent), then azacitidine 75 mg/m2 SQ on Days 1-7 every 28 days       Weight and Vitals:  Wt Readings from Last 3 Encounters:   02/09/21 (!) 138.4 kg (305 lb 3.2 oz)   02/02/21 (!) 138.3 kg (305 lb)   09/26/18 (!) 138 kg (304 lb 3.2 oz)     Temp Readings from Last 3 Encounters:   02/11/21 36.8 ??C (98.2 ??F) (Oral)   02/09/21 36.8 ??C (98.2 ??F) (Temporal)   02/09/21 37.1 ??C (98.8 ??F) (Oral)     BP Readings from Last 3 Encounters:   02/11/21 142/64   02/09/21 134/66   02/09/21 140/63     Pulse Readings from Last 3 Encounters:   02/11/21 97   02/09/21 96   02/09/21 101     Pertinent Labs:  No visits with results within 1 Day(s) from this visit.   Latest known visit with results is:   Lab on 02/11/2021   Component Date Value Ref Range Status   ??? Blood Type 02/11/2021 B POS   Final   ??? Antibody Screen 02/11/2021 NEG   Final   ??? Sodium 02/11/2021 139  135 - 145 mmol/L Final   ??? Potassium 02/11/2021 3.7  3.4 - 4.8 mmol/L Final   ??? Chloride 02/11/2021 104  98 - 107 mmol/L Final   ??? CO2 02/11/2021 26.0  20.0 - 31.0 mmol/L Final   ??? Anion Gap 02/11/2021 9  5 - 14 mmol/L Final   ??? BUN 02/11/2021 9  9 - 23 mg/dL Final   ???  Creatinine 02/11/2021 0.88  0.60 - 1.10 mg/dL Final   ??? BUN/Creatinine Ratio 02/11/2021 10   Final   ??? eGFR CKD-EPI (2021) Male 02/11/2021 86  >=60 mL/min/1.91m2 Final    eGFR calculated with CKD-EPI 2021 equation in accordance with [employer] and [employer] of Nephrology Task Force recommendations.   ??? Glucose 02/11/2021 120  70 - 179 mg/dL Final   ??? Calcium 60/12/9321 8.6 (L)  8.7 - 10.4 mg/dL Final   ??? Albumin 55/73/2202 3.2 (L)  3.4 - 5.0 g/dL Final   ??? Total Protein 02/11/2021 6.6  5.7 - 8.2 g/dL Final   ??? Total Bilirubin 02/11/2021 0.5  0.3 - 1.2 mg/dL Final   ??? AST 54/27/0623 12  <=34 U/L Final   ??? ALT 02/11/2021 7 (L)  10 - 49 U/L Final   ??? Alkaline Phosphatase 02/11/2021 76  46 - 116 U/L Final   ??? WBC 02/11/2021 5.6  3.6 - 11.2 10*9/L Final   ??? RBC 02/11/2021 3.31 (L)  4.26 - 5.60 10*12/L Final   ??? HGB 02/11/2021 8.7 (L)  12.9 - 16.5 g/dL Final   ??? HCT 76/28/3151 26.8 (L)  39.0 - 48.0 % Final   ??? MCV 02/11/2021 81.1  77.6 - 95.7 fL Final   ??? MCH 02/11/2021 26.4  25.9 - 32.4 pg Final   ??? MCHC 02/11/2021 32.5  32.0 - 36.0 g/dL Final   ??? RDW 76/16/0737 18.6 (H)  12.2 - 15.2 % Final   ??? MPV 02/11/2021 8.4  6.8 - 10.7 fL Final   ??? Platelet 02/11/2021 183  150 - 450 10*9/L Final   ??? Neutrophils % 02/11/2021 81.3  % Final   ??? Lymphocytes % 02/11/2021 14.4  % Final   ??? Monocytes % 02/11/2021 1.2  % Final   ??? Eosinophils % 02/11/2021 2.0  % Final   ??? Basophils % 02/11/2021 1.1  % Final   ??? Absolute Neutrophils 02/11/2021 4.6  1.8 - 7.8 10*9/L Final   ??? Absolute Lymphocytes 02/11/2021 0.8 (L)  1.1 - 3.6 10*9/L Final   ??? Absolute Monocytes 02/11/2021 0.1 (L)  0.3 - 0.8 10*9/L Final   ??? Absolute Eosinophils 02/11/2021 0.1  0.0 - 0.5 10*9/L Final   ??? Absolute Basophils 02/11/2021 0.1  0.0 - 0.1 10*9/L Final   ??? Anisocytosis 02/11/2021 Slight (A)  Not Present Final   ??? Hypochromasia 02/11/2021 Slight (A)  Not Present Final   ??? Spec Gravity/POC 02/11/2021 1.015  1.003 - 1.030 Final   ??? PH/POC 02/11/2021 6.0  5.0 - 9.0 Final   ??? Leuk Esterase/POC 02/11/2021 Negative  Negative Final   ??? Nitrite/POC 02/11/2021 Negative  Negative Final   ??? Protein/POC 02/11/2021 1+ (A)  Negative Final   ??? UA Glucose/POC 02/11/2021 Negative  Negative Final   ??? Ketones, POC 02/11/2021 Negative  Negative Final   ??? Bilirubin/POC 02/11/2021 Negative  Negative Final   ??? Blood/POC 02/11/2021 Trace-lysed (A)  Negative Final   ??? Urobilinogen/POC 02/11/2021 0.2  0.2 - 1.0 mg/dL Final       Allergies: No Known Allergies    Drug Interactions: None identified at this time; avoid CYP3A4 and Pgp inhibitors/inducers with venetoclax. Of note, maximum dose of venetoclax with posaconazole is 100 mg/day.     Current Medications:  Current Outpatient Medications   Medication Sig Dispense Refill   ??? allopurinol (ZYLOPRIM) 300 MG tablet Take 1 tablet (300 mg total) by mouth daily. 30 tablet 2   ??? hydroxyurea (HYDREA)  500 mg capsule Take 4 capsules (2,000 mg total) by mouth Two (2) times a day for 7 days. 56 capsule 0   ??? pantoprazole (PROTONIX) 40 MG tablet Take 1 tablet (40 mg total) by mouth Two (2) times a day. 60 tablet 0   ??? posaconazole (NOXAFIL) 100 mg delayed released tablet Take 3 tablets (300 mg total) by mouth once daily. 90 tablet 2   ??? traZODone (DESYREL) 50 MG tablet Take 50 mg by mouth daily.     ??? valACYclovir (VALTREX) 500 MG tablet Take 500 mg by mouth daily.     ??? venetoclax (VENCLEXTA) 100 mg tablet Take 4 tablets (400 mg total) by mouth once daily. Take with a meal and water. Do not chew, crush, or break tablets. 120 tablet 0     No current facility-administered medications for this visit.     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 Overall, he is doing well, and has previously receive azacitidine for treatment of his CMML. His biggest complaint is insomnia, for which he's been using nightly ZzzQuil with good effect over the last week. In reviewing his medication list, appears he also has trazodone 50 mg at bedtime PRN available, but he reports this is ineffective. Given Beer's list criteria and possible development of tolerance, will ask him to stop this and instead increase trazodone dose.     Plan:     1) AML  -Start azacitidine + venetoclax C1D1 today  -Stop hydroxyurea  -Venetoclax ramp-up: 100 mg day 1, 200 mg day 2, 400 mg day 3 onwards  -Twice weekly labs/transfusion   -Marrow at end of C1 on 1/3  ??  2) TLS prophylaxis  -Continue allopurinol 300 mg PO daily + PO hydration  ??  3) Infection prophylaxis  -Start levofloxacin 500 mg PO daily (althought ANC > 0.5, anticipate drop over course of next week with initiation of chemotherapy); Rx sent to local [facility]  -Continue valacyclovir 500 mg PO daily  -Will plan for posaconazole 300 mg PO daily no sooner than day 8 if ANC<0.5 (and dose adjust venetoclax to 100 mg daily once started); patient has received drug from [facility] and is aware to HOLD until instructed to start.   ??  4) Supportive care   -Start prochlorperazine 10 mg every 6 hours prn nausea; Rx sent to local [facility]  -Increase trazodone to 100 mg PO at bedtime PRN sleep; asked patient to stop nightly ZzzQuil (Beer's list item and  tolerance development)    F/u:  Future Appointments   Date Time Provider Department Center   02/14/2021  8:45 AM ADULT ONC LAB [facility]   02/14/2021 10:00 AM ONCHEM LEUKEMIA PHARMACIST [facility]   02/14/2021 11:00 AM [employer], AGNP [facility]   02/14/2021 12:30 PM ONCINF CHAIR 18 [facility]   02/15/2021  9:00 AM ADULT ONC LAB [facility]   02/15/2021 10:00 AM ONCINF CHAIR 03 [facility]   02/16/2021  9:30 AM ADULT ONC LAB [facility]   02/16/2021 10:30 AM ONCINF CHAIR 50 [facility]   02/17/2021  9:45 AM ADULT ONC LAB [facility]   02/17/2021 10:30 AM ONCINF CHAIR 28 [facility]   02/18/2021 12:00 PM ONCINF CHAIR 22 [facility]   02/19/2021 12:00 PM ONCINF CHAIR 01 [facility]  02/20/2021 11:30 AM ONCINF CHAIR 06 [facility]   02/21/2021 11:30 AM ADULT ONC LAB [facility]   02/21/2021  1:00 PM Angela Dawn Nichols, AGNP [facility]   02/21/2021  1:00 PM ONCHEM LEUKEMIA PHARMACIST [facility]   02/21/2021  2:00 PM ONCINF CHAIR 30 [facility]   02/24/2021 11:30 AM ADULT ONC LAB [facility]   02/24/2021 12:30 PM ONCINF CHAIR 47 [facility]   02/28/2021  9:30 AM ONCINF CHAIR 30 [facility]   03/03/2021  8:00 AM ADULT ONC LAB [facility]   03/03/2021  9:00 AM ONCHEM LEUKEMIA PHARMACIST [facility]   03/03/2021 10:00 AM ONCINF CHAIR 33 [facility]   03/08/2021 11:00 AM ADULT ONC LAB [facility]   03/08/2021 12:00 PM [employer], AGNP [facility]   03/08/2021  1:00 PM ONCINF CHAIR 31 [facility]   03/10/2021 11:30 AM ADULT ONC LAB [facility]   03/10/2021 12:30 PM ONCINF CHAIR 16 [facility]     I spent 40 minutes with Gary Baird in direct patient care.    Sandre Kitty, PharmD  PGY-2 Oncology Pharmacy Resident       Manfred Arch, PharmD, BCOP, CPP  Pager: [phone]

## 2021-02-15 ENCOUNTER — Ambulatory Visit: Admit: 2021-02-15 | Discharge: 2021-02-16 | Payer: MEDICARE

## 2021-02-15 ENCOUNTER — Other Ambulatory Visit: Admit: 2021-02-15 | Discharge: 2021-02-16 | Payer: MEDICARE

## 2021-02-15 ENCOUNTER — Inpatient Hospital Stay: Payer: Medicare Other

## 2021-02-15 DIAGNOSIS — C9200 Acute myeloblastic leukemia, not having achieved remission: Principal | ICD-10-CM

## 2021-02-15 LAB — COMPREHENSIVE METABOLIC PANEL
ALBUMIN: 3.1 g/dL — ABNORMAL LOW (ref 3.4–5.0)
ALKALINE PHOSPHATASE: 75 U/L (ref 46–116)
ALT (SGPT): <7 U/L — ABNORMAL LOW (ref 10–49)
ANION GAP: 7 mmol/L (ref 5–14)
AST (SGOT): 11 U/L (ref <=34)
BILIRUBIN TOTAL: 0.5 mg/dL (ref 0.3–1.2)
BLOOD UREA NITROGEN: 8 mg/dL — ABNORMAL LOW (ref 9–23)
BUN / CREAT RATIO: 10
CALCIUM: 8.7 mg/dL (ref 8.7–10.4)
CHLORIDE: 105 mmol/L (ref 98–107)
CO2: 28 mmol/L (ref 20.0–31.0)
CREATININE: 0.79 mg/dL
EGFR CKD-EPI (2021) MALE: 89 mL/min/{1.73_m2} (ref >=60)
GLUCOSE RANDOM: 132 mg/dL (ref 70–179)
POTASSIUM: 3.9 mmol/L (ref 3.4–4.8)
PROTEIN TOTAL: 6.6 g/dL (ref 5.7–8.2)
SODIUM: 140 mmol/L (ref 135–145)

## 2021-02-15 LAB — CBC W/ AUTO DIFF
BASOPHILS ABSOLUTE COUNT: 0 10*9/L (ref 0.0–0.1)
BASOPHILS RELATIVE PERCENT: 1 %
EOSINOPHILS ABSOLUTE COUNT: 0 10*9/L (ref 0.0–0.5)
EOSINOPHILS RELATIVE PERCENT: 1.8 %
HEMATOCRIT: 24.7 % — ABNORMAL LOW (ref 39.0–48.0)
HEMOGLOBIN: 8.1 g/dL — ABNORMAL LOW (ref 12.9–16.5)
LYMPHOCYTES ABSOLUTE COUNT: 0.3 10*9/L — ABNORMAL LOW (ref 1.1–3.6)
LYMPHOCYTES RELATIVE PERCENT: 20.9 %
MEAN CORPUSCULAR HEMOGLOBIN CONC: 32.8 g/dL (ref 32.0–36.0)
MEAN CORPUSCULAR HEMOGLOBIN: 26.9 pg (ref 25.9–32.4)
MEAN CORPUSCULAR VOLUME: 82 fL (ref 77.6–95.7)
MEAN PLATELET VOLUME: 7.4 fL (ref 6.8–10.7)
MONOCYTES ABSOLUTE COUNT: 0 10*9/L — ABNORMAL LOW (ref 0.3–0.8)
MONOCYTES RELATIVE PERCENT: 2.7 %
NEUTROPHILS ABSOLUTE COUNT: 1.1 10*9/L — ABNORMAL LOW (ref 1.8–7.8)
NEUTROPHILS RELATIVE PERCENT: 73.6 %
PLATELET COUNT: 210 10*9/L (ref 150–450)
RED BLOOD CELL COUNT: 3.01 10*12/L — ABNORMAL LOW (ref 4.26–5.60)
RED CELL DISTRIBUTION WIDTH: 19 % — ABNORMAL HIGH (ref 12.2–15.2)
WBC ADJUSTED: 1.5 10*9/L — ABNORMAL LOW (ref 3.6–11.2)

## 2021-02-15 LAB — URIC ACID: URIC ACID: 4.8 mg/dL

## 2021-02-15 LAB — LACTATE DEHYDROGENASE: LACTATE DEHYDROGENASE: 238 U/L (ref 120–246)

## 2021-02-15 LAB — MAGNESIUM: MAGNESIUM: 1.7 mg/dL (ref 1.6–2.6)

## 2021-02-15 LAB — PHOSPHORUS: PHOSPHORUS: 2.7 mg/dL (ref 2.4–5.1)

## 2021-02-15 MED ADMIN — azaCITIDine (VIDAZA) syringe: 75 mg/m2 | SUBCUTANEOUS | @ 17:00:00 | Stop: 2021-02-15

## 2021-02-15 MED ADMIN — ondansetron (ZOFRAN) tablet 8 mg: 8 mg | ORAL | @ 16:00:00 | Stop: 2021-02-15

## 2021-02-15 MED ADMIN — sodium chloride 0.9% (NS) bolus 500 mL: 500 mL | INTRAVENOUS | @ 16:00:00 | Stop: 2021-02-15

## 2021-02-15 NOTE — Unmapped (Signed)
1030: Pt here for scheduled injection.     1250: Pt tolerated treatment/injection/hydration W/O difficulty. PIV removed per protocol.   Pt left infusion center ambulatory. NAD, no questions nor complaints voiced at D/C. Pt aware of follow up.

## 2021-02-15 NOTE — Unmapped (Signed)
PIV obtained and blood specimen's sent to core lab for processing. Omkar Stratmann,RN

## 2021-02-16 ENCOUNTER — Encounter: Admit: 2021-02-16 | Discharge: 2021-02-17 | Payer: MEDICARE

## 2021-02-16 ENCOUNTER — Ambulatory Visit: Admit: 2021-02-16 | Discharge: 2021-02-17 | Payer: MEDICARE

## 2021-02-16 ENCOUNTER — Other Ambulatory Visit: Admit: 2021-02-16 | Discharge: 2021-02-17 | Payer: MEDICARE

## 2021-02-16 ENCOUNTER — Inpatient Hospital Stay: Payer: Medicare Other

## 2021-02-16 DIAGNOSIS — C9200 Acute myeloblastic leukemia, not having achieved remission: Principal | ICD-10-CM

## 2021-02-16 DIAGNOSIS — C9310 Chronic myelomonocytic leukemia not having achieved remission: Secondary | ICD-10-CM | POA: Diagnosis not present

## 2021-02-16 LAB — CBC W/ AUTO DIFF
BASOPHILS ABSOLUTE COUNT: 0 10*9/L (ref 0.0–0.1)
BASOPHILS RELATIVE PERCENT: 0.7 %
EOSINOPHILS ABSOLUTE COUNT: 0 10*9/L (ref 0.0–0.5)
EOSINOPHILS RELATIVE PERCENT: 2 %
HEMATOCRIT: 22.7 % — ABNORMAL LOW (ref 39.0–48.0)
HEMOGLOBIN: 7.4 g/dL — ABNORMAL LOW (ref 12.9–16.5)
LYMPHOCYTES ABSOLUTE COUNT: 0.3 10*9/L — ABNORMAL LOW (ref 1.1–3.6)
LYMPHOCYTES RELATIVE PERCENT: 24.2 %
MEAN CORPUSCULAR HEMOGLOBIN CONC: 32.5 g/dL (ref 32.0–36.0)
MEAN CORPUSCULAR HEMOGLOBIN: 26.6 pg (ref 25.9–32.4)
MEAN CORPUSCULAR VOLUME: 81.7 fL (ref 77.6–95.7)
MEAN PLATELET VOLUME: 7.3 fL (ref 6.8–10.7)
MONOCYTES ABSOLUTE COUNT: 0.1 10*9/L — ABNORMAL LOW (ref 0.3–0.8)
MONOCYTES RELATIVE PERCENT: 3.9 %
NEUTROPHILS ABSOLUTE COUNT: 0.9 10*9/L — ABNORMAL LOW (ref 1.8–7.8)
NEUTROPHILS RELATIVE PERCENT: 69.2 %
PLATELET COUNT: 185 10*9/L (ref 150–450)
RED BLOOD CELL COUNT: 2.78 10*12/L — ABNORMAL LOW (ref 4.26–5.60)
RED CELL DISTRIBUTION WIDTH: 19.2 % — ABNORMAL HIGH (ref 12.2–15.2)
WBC ADJUSTED: 1.3 10*9/L — ABNORMAL LOW (ref 3.6–11.2)

## 2021-02-16 LAB — COMPREHENSIVE METABOLIC PANEL
ALBUMIN: 3 g/dL — ABNORMAL LOW (ref 3.4–5.0)
ALKALINE PHOSPHATASE: 69 U/L (ref 46–116)
ALT (SGPT): <7 U/L — ABNORMAL LOW (ref 10–49)
ANION GAP: 9 mmol/L (ref 5–14)
AST (SGOT): 9 U/L (ref <=34)
BILIRUBIN TOTAL: 0.6 mg/dL (ref 0.3–1.2)
BLOOD UREA NITROGEN: 9 mg/dL (ref 9–23)
BUN / CREAT RATIO: 10
CALCIUM: 8.8 mg/dL (ref 8.7–10.4)
CHLORIDE: 101 mmol/L (ref 98–107)
CO2: 30 mmol/L (ref 20.0–31.0)
CREATININE: 0.86 mg/dL
EGFR CKD-EPI (2021) MALE: 87 mL/min/{1.73_m2} (ref >=60)
GLUCOSE RANDOM: 107 mg/dL (ref 70–179)
POTASSIUM: 3.8 mmol/L (ref 3.4–4.8)
PROTEIN TOTAL: 6.4 g/dL (ref 5.7–8.2)
SODIUM: 140 mmol/L (ref 135–145)

## 2021-02-16 LAB — URIC ACID: URIC ACID: 4.4 mg/dL

## 2021-02-16 LAB — MAGNESIUM: MAGNESIUM: 1.7 mg/dL (ref 1.6–2.6)

## 2021-02-16 LAB — LACTATE DEHYDROGENASE: LACTATE DEHYDROGENASE: 182 U/L (ref 120–246)

## 2021-02-16 LAB — PHOSPHORUS: PHOSPHORUS: 2.9 mg/dL (ref 2.4–5.1)

## 2021-02-16 MED ADMIN — azaCITIDine (VIDAZA) syringe: 75 mg/m2 | SUBCUTANEOUS | @ 16:00:00 | Stop: 2021-02-16

## 2021-02-16 MED ADMIN — sodium chloride 0.9% (NS) bolus 500 mL: 500 mL | INTRAVENOUS | @ 16:00:00 | Stop: 2021-02-16

## 2021-02-16 MED ADMIN — ondansetron (ZOFRAN) tablet 8 mg: 8 mg | ORAL | @ 16:00:00 | Stop: 2021-02-16

## 2021-02-16 NOTE — Unmapped (Signed)
Date:02/16/2021  Start Time: 0931  End Time: 1009  Setting: Outpatient, Lobby of 3rd floor   Cycle #: 1  Cycle Day: 3    Overall Assessment:     Gary Baird lives with his daughter, he has two other daughters who are incredibly supportive and all manage various parts of his care. 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 Since his hospitalization at the end of November and the start of his many hospital appointments he has not been able to complete his work as well as he would like and has not been outdoors as much. He has a cane that he typically does not use but has to begin using after recent hospitalization for a few days. He presented to clinic today in a transport wheelchair. He drives, manages his own medication, is independent with ADL, does some light cooking, and does his own yard work. His daughter can help with any other household task as needed. He identifies his faith and his family as being the most important thing to him. He enjoys spending time with his friends and going to church every Sunday. He denies falls. He wears glasses for reading (glasses present during session). He denies any changes in cognition or any new neuropathy. 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 He is unable to grip with this hand and has residual RUE weakness.        OT Assessments   PASS-Performance Assessment of Self-Care Skills   Task 1  Washing Hands    PERFORMANCE RATING SCORE    Independence 3    Safety 3    Adequacy (Process & Quality) 3    PASS Washing Hands Task Assessment:    Pt completed sit>stand from wheelchair and was able to walk a short distance to the sink to complete hand washing task. Returned to wheelchair after task.      Task 2  Medication Management    PERFORMANCE RATING SCORE    Independence 1.6    Safety 3    Adequacy (Process & Quality) 1 PASS Medication Management Task Assessment:  Pt with difficulty opening medication bottles, at baseline he has decreased functional use of R hand 2/2 polio when he was 5. Relies on his L hand.       DIMA   Dominant Hand  Left    Trial 1 Lbs.    Trial 2 Lbs.    Trial 3 Lbs.    Average Lbs.    Non-Dominant Hand     Trial 1 30Lbs.    Trial 2 25Lbs.    Trial 3 31Lbs.    Average  25.3Lbs.    DIMA Assessment:   Pt with poor active grip of R hand. Unable to grip dynamometer so assessment was not completed or scored for R hand.        Clock Drawing             Severity Rating    Clock Drawing Assessment:   Pt scored a 10/13 on the clock draw. Indicating a mild impairment. Hand lengths were the same, the clock read 10:50?       AM-PAC: Daily Activity Form   Adaptive equipment used during assessment     Item Score   Putting on & taking off regular lower body  clothing 4   Bathing (including washing, rinsing, drying) 4   Toileting (includes using the toilet, bedpan, or urinal) 4   Putting on & taking off regular upper body clothing 4   Taking care of personal grooming such as brushing teeth 4   Eating meals 4   Raw Score 24   T-Scale Score 57.54   CMS 0-100% Score 0%   AMPAC Assessment:           Clock Drawing Assessment:          Mo[employer] Cognitive Assessment Version 8.1 English    ? high school education      Item Score   Visuospatial/executive  __/5   Naming __/3   Memory No points    Attention __/6   Language __/3   Abstraction __/2   Delayed Recall  __/5   Orientation  __/6   Total __/30   MoCA Assessment:  [place] not completed.               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 Since slight weakness with his hospitalization and now frequent appointments he has not been able to engage in this. He is hopeful that he will get the chance next week. He is still able to attend church every Sunday and maintain his own ADL routine.           Modified COPM    Client goals Performance (1-10) Satisfaction (1-10)   *Subjective*     *Subjective*     *Subjective*     COPM Assessment:          Modified COPM    Client goals Performance (1-10) Satisfaction (1-10)   Playing with grandchildren 1x/week     Cooking dinner 3x/week     Take a bath 2x/week     COPM Assessment:

## 2021-02-16 NOTE — Unmapped (Signed)
PIV placed.  Labs drawn & sent for analysis. To next appt.  Care provided by Elizabeth Higgins RN.

## 2021-02-16 NOTE — Unmapped (Signed)
PT in clinic today for PRBC infusion. Labs drawn this a.m. PIV placed in lab this a.m., blood return brisk. Blood consent up to date. PT alert and oriented X 4. Ambulatory. PT was also given AZA injections by Henriette Combs while in clinic. PT received IV fluids over 2 hours. PIV was removed with tip intact at the end of infusions.   Declined AVS print out

## 2021-02-17 ENCOUNTER — Other Ambulatory Visit: Admit: 2021-02-17 | Discharge: 2021-02-18 | Payer: MEDICARE

## 2021-02-17 ENCOUNTER — Ambulatory Visit: Admit: 2021-02-17 | Discharge: 2021-02-18 | Payer: MEDICARE

## 2021-02-17 ENCOUNTER — Inpatient Hospital Stay: Payer: Medicare Other

## 2021-02-17 DIAGNOSIS — C9200 Acute myeloblastic leukemia, not having achieved remission: Principal | ICD-10-CM

## 2021-02-17 DIAGNOSIS — Z5111 Encounter for antineoplastic chemotherapy: Secondary | ICD-10-CM | POA: Diagnosis not present

## 2021-02-17 LAB — COMPREHENSIVE METABOLIC PANEL
ALBUMIN: 2.9 g/dL — ABNORMAL LOW (ref 3.4–5.0)
ALKALINE PHOSPHATASE: 69 U/L (ref 46–116)
ALT (SGPT): <7 U/L — ABNORMAL LOW (ref 10–49)
ANION GAP: 4 mmol/L — ABNORMAL LOW (ref 5–14)
AST (SGOT): 9 U/L (ref <=34)
BILIRUBIN TOTAL: 0.5 mg/dL (ref 0.3–1.2)
BLOOD UREA NITROGEN: 10 mg/dL (ref 9–23)
BUN / CREAT RATIO: 11
CALCIUM: 8.9 mg/dL (ref 8.7–10.4)
CHLORIDE: 102 mmol/L (ref 98–107)
CO2: 31 mmol/L (ref 20.0–31.0)
CREATININE: 0.89 mg/dL
EGFR CKD-EPI (2021) MALE: 86 mL/min/{1.73_m2} (ref >=60)
GLUCOSE RANDOM: 109 mg/dL (ref 70–179)
POTASSIUM: 3.6 mmol/L (ref 3.4–4.8)
PROTEIN TOTAL: 6.4 g/dL (ref 5.7–8.2)
SODIUM: 137 mmol/L (ref 135–145)

## 2021-02-17 LAB — CBC W/ AUTO DIFF
BASOPHILS ABSOLUTE COUNT: 0 10*9/L (ref 0.0–0.1)
BASOPHILS RELATIVE PERCENT: 1.2 %
EOSINOPHILS ABSOLUTE COUNT: 0 10*9/L (ref 0.0–0.5)
EOSINOPHILS RELATIVE PERCENT: 2.1 %
HEMATOCRIT: 24.2 % — ABNORMAL LOW (ref 39.0–48.0)
HEMOGLOBIN: 7.9 g/dL — ABNORMAL LOW (ref 12.9–16.5)
LYMPHOCYTES ABSOLUTE COUNT: 0.4 10*9/L — ABNORMAL LOW (ref 1.1–3.6)
LYMPHOCYTES RELATIVE PERCENT: 27.2 %
MEAN CORPUSCULAR HEMOGLOBIN CONC: 32.8 g/dL (ref 32.0–36.0)
MEAN CORPUSCULAR HEMOGLOBIN: 26.8 pg (ref 25.9–32.4)
MEAN CORPUSCULAR VOLUME: 81.8 fL (ref 77.6–95.7)
MEAN PLATELET VOLUME: 6.9 fL (ref 6.8–10.7)
MONOCYTES ABSOLUTE COUNT: 0.1 10*9/L — ABNORMAL LOW (ref 0.3–0.8)
MONOCYTES RELATIVE PERCENT: 4 %
NEUTROPHILS ABSOLUTE COUNT: 0.9 10*9/L — ABNORMAL LOW (ref 1.8–7.8)
NEUTROPHILS RELATIVE PERCENT: 65.5 %
PLATELET COUNT: 147 10*9/L — ABNORMAL LOW (ref 150–450)
RED BLOOD CELL COUNT: 2.96 10*12/L — ABNORMAL LOW (ref 4.26–5.60)
RED CELL DISTRIBUTION WIDTH: 18.6 % — ABNORMAL HIGH (ref 12.2–15.2)
WBC ADJUSTED: 1.4 10*9/L — ABNORMAL LOW (ref 3.6–11.2)

## 2021-02-17 LAB — PHOSPHORUS: PHOSPHORUS: 2.6 mg/dL (ref 2.4–5.1)

## 2021-02-17 LAB — MAGNESIUM: MAGNESIUM: 1.7 mg/dL (ref 1.6–2.6)

## 2021-02-17 LAB — LACTATE DEHYDROGENASE: LACTATE DEHYDROGENASE: 162 U/L (ref 120–246)

## 2021-02-17 LAB — URIC ACID: URIC ACID: 3.8 mg/dL

## 2021-02-17 MED ADMIN — ondansetron (ZOFRAN) tablet 8 mg: 8 mg | ORAL | @ 20:00:00 | Stop: 2021-02-17

## 2021-02-17 MED ADMIN — azaCITIDine (VIDAZA) syringe: 75 mg/m2 | SUBCUTANEOUS | @ 20:00:00 | Stop: 2021-02-17

## 2021-02-17 MED ADMIN — sodium chloride 0.9% (NS) bolus 500 mL: 500 mL | INTRAVENOUS | @ 18:00:00 | Stop: 2021-02-17

## 2021-02-17 NOTE — Unmapped (Unsigned)
PACT Study Physical Therapy Assessments    PACT Study (IRB [id number])  Date: 02/17/2021   Start Time: 1000   End Time: 1044  Setting: Outpatient clinic, exam room   Cycle #: 1  Cycle Day: 4    Patient Subjective & Discussion: ***  [name] presented to our initial assessment with his Daughter, [name]. He his typically active and independent but has not been able to work since before Thanksgiving of this year due to multiple appointments and fatigue. He lives with his Wife and one of his Daughters. He has a great support system from his family (including 4 Daughters) who are assisting with caring for his wife and with transportation. 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 He continues to be independent, no use of DME or falls but his Daughter reported that at times her Sister has to remind him to take breaks and not push himself too hard.        PT Assessments     AMPAC Short Form: 5 Click                Completed: Yes                                                                          Assistive device used during assessment  None       Item Score    Difficulty turning over in bed? 4   Difficulty sitting down/standing up from chair with arms? 4   Difficulty moving from supine to sitting on the edge of the bed? 4   Help moving to and from bed from wheelchair? 4   Help currently needed to walk? 4   Raw Score 20/20   T-Scale Score 56.83   CMS 0-100% Score 0%   AMPAC Assessment:          Timed Up & Go (TUG)       Completed: Yes                                                               Assistive device used during assessment None      Time  13.4 seconds   TUG Assessment:   Two trials performed, first trial time: 17.3 seconds. He required use of both hands to stand from the chair.       Berg Balance Scale    Completed: Yes       Item Score   Sitting unsupported 4   Sitting to standing  3   Standing to sitting 3   Pivot transfers 3   Standing unsupported 4   Standing with eyes closed 4   Standing with feet together 4   Tandem standing  3   Turning to look behind  2   Retrieving object from floor 4   Turning 360?  4   Placing alternating feet on stool 2   Reaching forward with outstretched arms 3   Standing on one  foot 1   Total Score 44/56   Berg Balance Scale Assessment:    A score of 44/56 indicates a low risk for falls.        6 Minute Walk Test     Completed: Yes      Assistive device used during assessment None    Distance  73.2 meters   Assessment: [name] reported significant fatigue after ambulating 2 minutes and requested a seated rest break. He reported 7/10 on RPE. Further ambulation deferred due to level of fatigue.     30 Second Sit to Stand    Completed: Yes      Repetitions  4 reps   30 Second Sit to Stand Assessment: [name] was unable to stand without using his hands from the recliner. He also needed both hands to help control his descent into the chair. He had moderate difficulty performing each stand.     PT Goals:     [name] will ambulate 15 minutes with RPE >3/10 in 6 weeks.

## 2021-02-17 NOTE — Unmapped (Signed)
Pt. Arrived to chair 40 for C1D4 Aza injection and IVF. Labs were within parameter for tx today. Pt. hbg 7.9, pt. Had 1 unit of PRBCs transfused yesterday. Pt is not asymptomatic, ok to not transfuse today per APP Toley. Will be back tomorrow for lab check. Pt. Given IVF and Aza injection and tolerated tx well. PIV was flushed, +BR and left in place per patient request for appointment tomorrow. Patient was discharged ambulatory, AVS declined.

## 2021-02-18 ENCOUNTER — Ambulatory Visit: Admit: 2021-02-18 | Discharge: 2021-02-19 | Payer: MEDICARE

## 2021-02-18 ENCOUNTER — Inpatient Hospital Stay: Payer: Medicare Other

## 2021-02-18 DIAGNOSIS — C9200 Acute myeloblastic leukemia, not having achieved remission: Secondary | ICD-10-CM | POA: Diagnosis not present

## 2021-02-18 DIAGNOSIS — Z5111 Encounter for antineoplastic chemotherapy: Secondary | ICD-10-CM | POA: Diagnosis not present

## 2021-02-18 MED ADMIN — ondansetron (ZOFRAN) tablet 8 mg: 8 mg | ORAL | @ 18:00:00 | Stop: 2021-02-18

## 2021-02-18 MED ADMIN — azaCITIDine (VIDAZA) syringe: 75 mg/m2 | SUBCUTANEOUS | @ 18:00:00 | Stop: 2021-02-18

## 2021-02-18 NOTE — Unmapped (Signed)
SubQ injection given w/o complication. Educated patient and daughter on weekend infusion workflow, both voiced understanding. AVS declined. Patient discharged to home, NAD.

## 2021-02-18 NOTE — Unmapped (Signed)
No visits with results within 1 Day(s) from this visit.   Latest known visit with results is:   Hospital Outpatient Visit on 02/17/2021   Component Date Value Ref Range Status    Crossmatch 02/18/2021 Compatible   Final    Unit Blood Type 02/18/2021 B Neg   Final    ISBT Number 02/18/2021 1700   Final    Unit # 02/18/2021 [id number]   Final    Status 02/18/2021 Released to Avail   Final    Spec Expiration 02/18/2021 [id number]   Final    Product ID 02/18/2021 Red Blood Cells   Final    PRODUCT CODE 02/18/2021 [id number]   Final    Crossmatch 02/18/2021 Compatible   Final    Unit Blood Type 02/18/2021 B Pos   Final    ISBT Number 02/18/2021 7300   Final    Unit # 02/18/2021 [id number]   Final    Status 02/18/2021 Released to Avail   Final    Spec Expiration 02/18/2021 [id number]   Final    Product ID 02/18/2021 Red Blood Cells   Final    PRODUCT CODE 02/18/2021 [id number]   Final

## 2021-02-19 ENCOUNTER — Ambulatory Visit: Admit: 2021-02-19 | Discharge: 2021-02-20 | Payer: MEDICARE

## 2021-02-19 DIAGNOSIS — C9200 Acute myeloblastic leukemia, not having achieved remission: Secondary | ICD-10-CM | POA: Diagnosis not present

## 2021-02-19 MED ADMIN — ondansetron (ZOFRAN) tablet 8 mg: 8 mg | ORAL | @ 18:00:00 | Stop: 2021-02-19

## 2021-02-19 MED ADMIN — azaCITIDine (VIDAZA) syringe: 75 mg/m2 | SUBCUTANEOUS | @ 19:00:00 | Stop: 2021-02-19

## 2021-02-19 NOTE — Unmapped (Signed)
No visits with results within 1 Day(s) from this visit.   Latest known visit with results is:   Hospital Outpatient Visit on 02/17/2021   Component Date Value Ref Range Status    Crossmatch 02/18/2021 Compatible   Final    Unit Blood Type 02/18/2021 B Neg   Final    ISBT Number 02/18/2021 1700   Final    Unit # 02/18/2021 [id number]   Final    Status 02/18/2021 Released to Avail   Final    Spec Expiration 02/18/2021 [id number]   Final    Product ID 02/18/2021 Red Blood Cells   Final    PRODUCT CODE 02/18/2021 [id number]   Final    Crossmatch 02/18/2021 Compatible   Final    Unit Blood Type 02/18/2021 B Pos   Final    ISBT Number 02/18/2021 7300   Final    Unit # 02/18/2021 [id number]   Final    Status 02/18/2021 Released to Avail   Final    Spec Expiration 02/18/2021 [id number]   Final    Product ID 02/18/2021 Red Blood Cells   Final    PRODUCT CODE 02/18/2021 [id number]   Final

## 2021-02-19 NOTE — Unmapped (Signed)
Patient arrived to chair 24. Subcutaneous shot given as ordered pre meds was given. Patient tolerated shot with no issue. AVS on my chart. Patient left floor in no acute distress.

## 2021-02-20 ENCOUNTER — Encounter: Admit: 2021-02-20 | Discharge: 2021-02-21 | Payer: MEDICARE

## 2021-02-20 ENCOUNTER — Ambulatory Visit: Admit: 2021-02-20 | Discharge: 2021-02-21 | Payer: MEDICARE

## 2021-02-20 DIAGNOSIS — C9200 Acute myeloblastic leukemia, not having achieved remission: Secondary | ICD-10-CM | POA: Diagnosis not present

## 2021-02-20 DIAGNOSIS — C9310 Chronic myelomonocytic leukemia not having achieved remission: Secondary | ICD-10-CM | POA: Diagnosis not present

## 2021-02-20 DIAGNOSIS — Z5111 Encounter for antineoplastic chemotherapy: Secondary | ICD-10-CM | POA: Diagnosis not present

## 2021-02-20 LAB — MAGNESIUM: MAGNESIUM: 1.7 mg/dL (ref 1.6–2.6)

## 2021-02-20 LAB — COMPREHENSIVE METABOLIC PANEL
ALBUMIN: 3.1 g/dL — ABNORMAL LOW (ref 3.4–5.0)
ALKALINE PHOSPHATASE: 83 U/L (ref 46–116)
ALT (SGPT): <7 U/L — ABNORMAL LOW (ref 10–49)
ANION GAP: 8 mmol/L (ref 5–14)
AST (SGOT): 9 U/L (ref <=34)
BILIRUBIN TOTAL: 0.5 mg/dL (ref 0.3–1.2)
BLOOD UREA NITROGEN: 10 mg/dL (ref 9–23)
BUN / CREAT RATIO: 10
CALCIUM: 9.1 mg/dL (ref 8.7–10.4)
CHLORIDE: 98 mmol/L (ref 98–107)
CO2: 32 mmol/L — ABNORMAL HIGH (ref 20.0–31.0)
CREATININE: 0.96 mg/dL
EGFR CKD-EPI (2021) MALE: 79 mL/min/{1.73_m2} (ref >=60)
GLUCOSE RANDOM: 108 mg/dL (ref 70–179)
POTASSIUM: 3.3 mmol/L — ABNORMAL LOW (ref 3.4–4.8)
PROTEIN TOTAL: 6.6 g/dL (ref 5.7–8.2)
SODIUM: 138 mmol/L (ref 135–145)

## 2021-02-20 LAB — CBC W/ AUTO DIFF
BASOPHILS ABSOLUTE COUNT: 0 10*9/L (ref 0.0–0.1)
BASOPHILS RELATIVE PERCENT: 0.5 %
EOSINOPHILS ABSOLUTE COUNT: 0 10*9/L (ref 0.0–0.5)
EOSINOPHILS RELATIVE PERCENT: 1.8 %
HEMATOCRIT: 21.4 % — ABNORMAL LOW (ref 39.0–48.0)
HEMOGLOBIN: 7 g/dL — ABNORMAL LOW (ref 12.9–16.5)
LYMPHOCYTES ABSOLUTE COUNT: 0.5 10*9/L — ABNORMAL LOW (ref 1.1–3.6)
LYMPHOCYTES RELATIVE PERCENT: 25.6 %
MEAN CORPUSCULAR HEMOGLOBIN CONC: 32.7 g/dL (ref 32.0–36.0)
MEAN CORPUSCULAR HEMOGLOBIN: 26.8 pg (ref 25.9–32.4)
MEAN CORPUSCULAR VOLUME: 81.9 fL (ref 77.6–95.7)
MEAN PLATELET VOLUME: 7.2 fL (ref 6.8–10.7)
MONOCYTES ABSOLUTE COUNT: 0.1 10*9/L — ABNORMAL LOW (ref 0.3–0.8)
MONOCYTES RELATIVE PERCENT: 6.7 %
NEUTROPHILS ABSOLUTE COUNT: 1.2 10*9/L — ABNORMAL LOW (ref 1.8–7.8)
NEUTROPHILS RELATIVE PERCENT: 65.4 %
PLATELET COUNT: 86 10*9/L — ABNORMAL LOW (ref 150–450)
RED BLOOD CELL COUNT: 2.62 10*12/L — ABNORMAL LOW (ref 4.26–5.60)
RED CELL DISTRIBUTION WIDTH: 19.6 % — ABNORMAL HIGH (ref 12.2–15.2)
WBC ADJUSTED: 1.8 10*9/L — ABNORMAL LOW (ref 3.6–11.2)

## 2021-02-20 LAB — LACTATE DEHYDROGENASE: LACTATE DEHYDROGENASE: 152 U/L (ref 120–246)

## 2021-02-20 LAB — PHOSPHORUS: PHOSPHORUS: 3 mg/dL (ref 2.4–5.1)

## 2021-02-20 LAB — URIC ACID: URIC ACID: 3.6 mg/dL — ABNORMAL LOW

## 2021-02-20 MED ADMIN — ondansetron (ZOFRAN) tablet 8 mg: 8 mg | ORAL | @ 17:00:00 | Stop: 2021-02-20

## 2021-02-20 MED ADMIN — azaCITIDine (VIDAZA) syringe: 75 mg/m2 | SUBCUTANEOUS | @ 18:00:00 | Stop: 2021-02-20

## 2021-02-20 MED ADMIN — sodium chloride (NS) 0.9 % infusion: INTRAVENOUS | @ 19:00:00

## 2021-02-20 NOTE — Unmapped (Signed)
Hospital Outpatient Visit on 02/20/2021   Component Date Value Ref Range Status    Sodium 02/20/2021 138  135 - 145 mmol/L Final    Potassium 02/20/2021 3.3 (L)  3.4 - 4.8 mmol/L Final    Chloride 02/20/2021 98  98 - 107 mmol/L Final    CO2 02/20/2021 32.0 (H)  20.0 - 31.0 mmol/L Final    Anion Gap 02/20/2021 8  5 - 14 mmol/L Final    BUN 02/20/2021 10  9 - 23 mg/dL Final    Creatinine 29/56/2130 0.96  0.60 - 1.10 mg/dL Final    BUN/Creatinine Ratio 02/20/2021 10   Final    eGFR CKD-EPI (2021) Male 02/20/2021 79  >=60 mL/min/1.35m2 Final    eGFR calculated with CKD-EPI 2021 equation in accordance with [employer] and [employer] of Nephrology Task Force recommendations.    Glucose 02/20/2021 108  70 - 179 mg/dL Final    Calcium 86/57/8469 9.1  8.7 - 10.4 mg/dL Final    Albumin 62/95/2841 3.1 (L)  3.4 - 5.0 g/dL Final    Total Protein 02/20/2021 6.6  5.7 - 8.2 g/dL Final    Total Bilirubin 02/20/2021 0.5  0.3 - 1.2 mg/dL Final    AST 32/44/0102 9  <=34 U/L Final    ALT 02/20/2021 <7 (L)  10 - 49 U/L Final    Alkaline Phosphatase 02/20/2021 83  46 - 116 U/L Final    Magnesium 02/20/2021 1.7  1.6 - 2.6 mg/dL Final    Phosphorus 72/53/6644 3.0  2.4 - 5.1 mg/dL Final    LDH 03/47/4259 152  120 - 246 U/L Final    Uric Acid 02/20/2021 3.6 (L)  3.7 - 9.2 mg/dL Final    WBC 56/38/7564 1.8 (L)  3.6 - 11.2 10*9/L Final    RBC 02/20/2021 2.62 (L)  4.26 - 5.60 10*12/L Final    HGB 02/20/2021 7.0 (L)  12.9 - 16.5 g/dL Final    HCT 33/29/5188 21.4 (L)  39.0 - 48.0 % Final    MCV 02/20/2021 81.9  77.6 - 95.7 fL Final    MCH 02/20/2021 26.8  25.9 - 32.4 pg Final    MCHC 02/20/2021 32.7  32.0 - 36.0 g/dL Final    RDW 41/66/0630 19.6 (H)  12.2 - 15.2 % Final    MPV 02/20/2021 7.2  6.8 - 10.7 fL Final    Platelet 02/20/2021 86 (L)  150 - 450 10*9/L Final    Neutrophils % 02/20/2021 65.4  % Final    Lymphocytes % 02/20/2021 25.6  % Final    Monocytes % 02/20/2021 6.7  % Final    Eosinophils % 02/20/2021 1.8  % Final    Basophils % 02/20/2021 0.5  % Final    Absolute Neutrophils 02/20/2021 1.2 (L)  1.8 - 7.8 10*9/L Final    Absolute Lymphocytes 02/20/2021 0.5 (L)  1.1 - 3.6 10*9/L Final    Absolute Monocytes 02/20/2021 0.1 (L)  0.3 - 0.8 10*9/L Final    Absolute Eosinophils 02/20/2021 0.0  0.0 - 0.5 10*9/L Final    Absolute Basophils 02/20/2021 0.0  0.0 - 0.1 10*9/L Final    Anisocytosis 02/20/2021 Moderate (A)  Not Present Final    Hypochromasia 02/20/2021 Slight (A)  Not Present Final    Antibody Screen 02/20/2021 NEG   Final    Blood Type 02/20/2021 B POS   Final    Crossmatch 02/20/2021 Compatible   Final    Unit Blood Type 02/20/2021 B Pos  Final    ISBT Number 02/20/2021 7300   Final    Unit # 02/20/2021 [id number]   Final    Status 02/20/2021 Ready   Final    Spec Expiration 02/20/2021 [id number]   Final    Product ID 02/20/2021 Red Blood Cells   Final    PRODUCT CODE 02/20/2021 [id number]   Final    Crossmatch 02/20/2021 Compatible   Final    Unit Blood Type 02/20/2021 B Pos   Final    ISBT Number 02/20/2021 7300   Final    Unit # 02/20/2021 [id number]   Final    Status 02/20/2021 Ready   Final    Spec Expiration 02/20/2021 [id number]   Final    Product ID 02/20/2021 Red Blood Cells   Final    PRODUCT CODE 02/20/2021 [id number]   Final

## 2021-02-21 ENCOUNTER — Ambulatory Visit: Admit: 2021-02-21 | Discharge: 2021-02-21 | Payer: MEDICARE

## 2021-02-21 ENCOUNTER — Other Ambulatory Visit: Admit: 2021-02-21 | Discharge: 2021-02-21 | Payer: MEDICARE

## 2021-02-21 DIAGNOSIS — C9200 Acute myeloblastic leukemia, not having achieved remission: Principal | ICD-10-CM

## 2021-02-21 LAB — CBC W/ AUTO DIFF
BASOPHILS ABSOLUTE COUNT: 0 10*9/L (ref 0.0–0.1)
BASOPHILS RELATIVE PERCENT: 0.5 %
EOSINOPHILS ABSOLUTE COUNT: 0 10*9/L (ref 0.0–0.5)
EOSINOPHILS RELATIVE PERCENT: 1.6 %
HEMATOCRIT: 28.6 % — ABNORMAL LOW (ref 39.0–48.0)
HEMOGLOBIN: 9.5 g/dL — ABNORMAL LOW (ref 12.9–16.5)
LYMPHOCYTES ABSOLUTE COUNT: 0.4 10*9/L — ABNORMAL LOW (ref 1.1–3.6)
LYMPHOCYTES RELATIVE PERCENT: 20.2 %
MEAN CORPUSCULAR HEMOGLOBIN CONC: 33.3 g/dL (ref 32.0–36.0)
MEAN CORPUSCULAR HEMOGLOBIN: 27.8 pg (ref 25.9–32.4)
MEAN CORPUSCULAR VOLUME: 83.5 fL (ref 77.6–95.7)
MEAN PLATELET VOLUME: 7.2 fL (ref 6.8–10.7)
MONOCYTES ABSOLUTE COUNT: 0.2 10*9/L — ABNORMAL LOW (ref 0.3–0.8)
MONOCYTES RELATIVE PERCENT: 9.4 %
NEUTROPHILS ABSOLUTE COUNT: 1.4 10*9/L — ABNORMAL LOW (ref 1.8–7.8)
NEUTROPHILS RELATIVE PERCENT: 68.3 %
PLATELET COUNT: 65 10*9/L — ABNORMAL LOW (ref 150–450)
RED BLOOD CELL COUNT: 3.42 10*12/L — ABNORMAL LOW (ref 4.26–5.60)
RED CELL DISTRIBUTION WIDTH: 18.1 % — ABNORMAL HIGH (ref 12.2–15.2)
WBC ADJUSTED: 2 10*9/L — ABNORMAL LOW (ref 3.6–11.2)

## 2021-02-21 LAB — COMPREHENSIVE METABOLIC PANEL
ALBUMIN: 3.2 g/dL — ABNORMAL LOW (ref 3.4–5.0)
ALKALINE PHOSPHATASE: 91 U/L (ref 46–116)
ALT (SGPT): <7 U/L — ABNORMAL LOW (ref 10–49)
ANION GAP: 9 mmol/L (ref 5–14)
AST (SGOT): 11 U/L (ref <=34)
BILIRUBIN TOTAL: 0.8 mg/dL (ref 0.3–1.2)
BLOOD UREA NITROGEN: 11 mg/dL (ref 9–23)
BUN / CREAT RATIO: 12
CALCIUM: 9 mg/dL (ref 8.7–10.4)
CHLORIDE: 97 mmol/L — ABNORMAL LOW (ref 98–107)
CO2: 32 mmol/L — ABNORMAL HIGH (ref 20.0–31.0)
CREATININE: 0.93 mg/dL
EGFR CKD-EPI (2021) MALE: 82 mL/min/{1.73_m2} (ref >=60)
GLUCOSE RANDOM: 109 mg/dL (ref 70–179)
POTASSIUM: 3.6 mmol/L (ref 3.4–4.8)
PROTEIN TOTAL: 6.7 g/dL (ref 5.7–8.2)
SODIUM: 138 mmol/L (ref 135–145)

## 2021-02-21 NOTE — Unmapped (Signed)
PIV obtained and blood specimen's sent to core lab for processing. Troi Bechtold,RN

## 2021-02-21 NOTE — Unmapped (Signed)
It was great to meet you today! You are doing well!   -CONTINUE venetoclax (Venclexta) 4 tablets (400 mg total) by mouth daily after breakfast. This is the chemo pill.   -CONTINUE levofloxacin (Levaquin) 1 tablet (500 mg total) by mouth daily. This is the preventative antibiotic.   -CONTINUE valacyclovir (Valtrex) 1 tablet (500 mg total) by mouth daily. This is the preventative antiviral.   -CONTINUE allopurinol 1 tablet (300 mg) by mouth daily.   -DO NOT START posaconazole (Noxafil) 3 tablets (300 mg total daily) antifungal medicine yet. We will tell you when to start this.

## 2021-02-21 NOTE — Unmapped (Signed)
Pt tolerated chemo injections without difficulty today.   Hgb  7.0 today.  Pt received 2u PRBCs without complications.  AVS given.  Pt was stable at discharge via wheelchair by Belenda Cruise, RN to lobby to meet daughter.

## 2021-02-21 NOTE — Unmapped (Addendum)
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 Deshotel reports he feels well today. Has been taking allopurinol daily and staying hydrated. Ramped up venetoclax as instructed and now taking venetoclax 4 tablets once daily after breakfast. He does have posaconazole but his daughter set this aside separately so he has not been taking it and knows not to do so until instructed. Some bruising with azacitidine injections but no bleeding. No SOB or dizziness. Appetite good. No mouth sores. No nausea/vomiting, no constipation/diarrhea, no new pain, no rash or skin changes.     On labs: WBC 2.0, Hgb 9.5, PLT 65, ANC 1.4. CMP WNL.   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)    Presentation: Incidentally found to have elevated WBC prior to procedure    Labs- WBC = 25.2  Hb = 8.2 g/dL  Platelets = 478  29% monocytes    Bone marrow biopsy- 09/04/2018  Hypercellular marrow- 70-90% cellular, 7% blasts by manual aspirate differential- monocytic appearance, granulocytic dysplasia, dysplasia of megakaryocytes    Final Diagnosis   Date Value Ref Range Status   09/04/2018   Final    (Outside Case #:  [id number], dated 09/04/2018)  Bone marrow,  aspiration and biopsy  -  Hypercellular bone marrow (90%) involved by chronic myelomonocytic leukemia-1 (9% blasts by manual aspirate differential) (see Comment)  -  Unifocal paratrabecular cluster of atypical CD17-positive cells (see Comment)  -  By outside report, cytogenetic analysis reveals a normal karyotype.  -  By outside report, next generation sequencing for myeloid disorders reveals pathogenic or likely pathogenic variants in CBL (p.C384Y), SRSF2 (p.P95H), TET2 ([id number]*41), and SH2B3 (L.[id number]*72).    Peripheral blood, smear review  -  Leukocytosis with absolute monocytosis  -  Normocytic anemia  -  Thrombocytosis    This electronic signature is attestation that the pathologist personally reviewed the submitted material(s) and the final diagnosis reflects that evaluation.         Flow cytometry: No myeloblasts detectable, 16% monocytes- HLA-DR, CD11b, CD13, CD14, CD33, CD38, CD56, CD64 and without CD34 expression     Cytogenetics = Normal, 46,XY    Mutational Panel:   CBL C384Y- 19.0% VAF  SH2B3 S256Qfs- 58.8% VAF  SRSF2 P95H- 47.1% VAF  TET2 R1440- 86% VAF    Treatment: Azacitidine 75mg /m2 daily for 5 days monthly (09/30/18--12/31/20)        2. Diagnosis: Secondary AML     Developed hematochezia, fatigue, blurry vision, DOE at end of Nov 2022.  WBC 136, Hb 5.4, Pl 313, peripheral blasts 8%      Diagnosis   Date Value Ref Range Status   01/31/2021   Final    Bone marrow, right iliac, aspiration and biopsy  -  Hypercellular bone marrow (90%) consistent with involvement by acute myeloid leukemia (53% blasts by manual aspirate differential)  -  See linked reports for associated Ancillary Studies.      This electronic signature is attestation  that the pathologist personally reviewed the submitted material(s) and the final diagnosis reflects that evaluation.          RESULTS   Date Value Ref Range Status   01/31/2021   Preliminary    Normal Karyotype: 46,XY[10]    Normal FISH:  An interphase FISH assay shows no evidence of a rearrangement involving the KMT2A (MLL) gene region in the 200 nuclei scored.          Myeloid Panel:   Variants of Known/Likely Clinical Significance:   Gene Coding Predicted Protein Variant allele fraction   ASXL1 c.2158del p.(Asp720Thrfs*5) 42.7 %   SRSF2 c.284C>A p.(Pro95His) 45.5 %   TET2 c.4317dupA p.(Arg1440Thrfs*38) 90.4 %        CMML (chronic myelomonocytic leukemia) (CMS-HCC)   09/19/2018 Initial Diagnosis    CMML (chronic myelomonocytic leukemia) (CMS-HCC)     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)   02/04/2021 Initial Diagnosis    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)     02/14/2021 -  Chemotherapy    OP AML AZACITIDINE + VENETOCLAX  azacitidine 75 mg/m2 SQ on days 1-7, venetoclax ramp up Week 1 (dose dependent), then azacitidine 75 mg/m2 SQ on Days 1-7 every 28 days      Chemotherapy    OP AML AZACITIDINE + VENETOCLAX      azacitidine 75 mg/m2 SQ on days 1-7, venetoclax ramp up Week 1 (dose dependent), then azacitidine 75 mg/m2 SQ on Days 1-7 every 28 days       Weight and Vitals:  Wt Readings from Last 3 Encounters:   02/21/21 (!) 142.9 kg (315 lb)   02/17/21 (!) 142.9 kg (315 lb 0.6 oz)   02/15/21 (!) 142.9 kg (315 lb 0.6 oz)     Temp Readings from Last 3 Encounters:   02/21/21 36.3 ??C (97.4 ??F) (Temporal)   02/20/21 36.8 ??C (98.2 ??F) (Oral)   02/18/21 36.9 ??C (98.4 ??F) (Oral)     BP Readings from Last 3 Encounters:   02/21/21 111/59   02/20/21 130/63   02/18/21 114/56     Pulse Readings from Last 3 Encounters:   02/21/21 96   02/20/21 94   02/18/21 101     Pertinent Labs:  Lab on 02/21/2021   Component Date Value Ref Range Status   ??? WBC 02/21/2021 2.0 (L)  3.6 - 11.2 10*9/L Final   ??? RBC 02/21/2021 3.42 (L)  4.26 - 5.60 10*12/L Final   ??? HGB 02/21/2021 9.5 (L)  12.9 - 16.5 g/dL Final   ??? HCT 16/12/9602 28.6 (L)  39.0 - 48.0 % Final   ??? MCV 02/21/2021 83.5  77.6 - 95.7 fL Final   ??? MCH 02/21/2021 27.8  25.9 - 32.4 pg Final   ??? MCHC 02/21/2021 33.3  32.0 - 36.0 g/dL Final   ??? RDW 54/11/8117 18.1 (H)  12.2 - 15.2 % Final   ??? MPV 02/21/2021 7.2  6.8 - 10.7 fL Final   ??? Platelet 02/21/2021 65 (L)  150 - 450 10*9/L Final   ??? Neutrophils % 02/21/2021 68.3  % Final   ??? Lymphocytes % 02/21/2021 20.2  % Final   ??? Monocytes % 02/21/2021 9.4  % Final   ??? Eosinophils % 02/21/2021 1.6  % Final   ??? Basophils % 02/21/2021 0.5  % Final   ??? Absolute Neutrophils 02/21/2021 1.4 (L)  1.8 - 7.8 10*9/L Final   ??? Absolute Lymphocytes 02/21/2021 0.4 (L)  1.1 - 3.6  10*9/L Final   ??? Absolute Monocytes 02/21/2021 0.2 (L)  0.3 - 0.8 10*9/L Final   ??? Absolute Eosinophils 02/21/2021 0.0  0.0 - 0.5 10*9/L Final   ??? Absolute Basophils 02/21/2021 0.0  0.0 - 0.1 10*9/L Final   ??? Anisocytosis 02/21/2021 Slight (A)  Not Present Final       Allergies: No Known Allergies    Drug Interactions: None identified at this time; avoid CYP3A4 and Pgp inhibitors/inducers with venetoclax. Of note, maximum dose of venetoclax with posaconazole is 100 mg/day.     Current Medications:  Current Outpatient Medications   Medication Sig Dispense Refill   ??? allopurinol (ZYLOPRIM) 300 MG tablet Take 1 tablet (300 mg total) by mouth daily. 30 tablet 2   ??? hydroCHLOROthiazide (HYDRODIURIL) 12.5 MG tablet Take 12.5 mg by mouth daily.     ??? levoFLOXacin (LEVAQUIN) 500 MG tablet Take 1 tablet (500 mg total) by mouth daily. 30 tablet 3   ??? pantoprazole (PROTONIX) 40 MG tablet Take 1 tablet (40 mg total) by mouth Two (2) times a day. 60 tablet 0   ??? posaconazole (NOXAFIL) 100 mg delayed released tablet Take 3 tablets (300 mg total) by mouth once daily. 90 tablet 2   ??? prochlorperazine (COMPAZINE) 10 MG tablet Take 1 tablet (10 mg total) by mouth every six (6) hours as needed for nausea. 30 tablet 2   ??? valACYclovir (VALTREX) 500 MG tablet Take 500 mg by mouth daily.     ??? venetoclax (VENCLEXTA) 100 mg tablet Take 4 tablets (400 mg total) by mouth once daily. Take with a meal and water. Do not chew, crush, or break tablets. 120 tablet 0   ??? traZODone (DESYREL) 50 MG tablet Take 100 mg by mouth nightly as needed.       No current facility-administered medications for this visit.     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 Mr. Ballantine is feeling well, taking all medications appropriately, and no signs of TLS. ANC remains above 0.5 so will continue venetoclax 400 mg daily dose and hold off on starting posaconazole. No transfusion required today.     Plan:     1) AML  -Continue azacitidine + venetoclax C1D8 today, with venetoclax 400 mg daily (reduce to 100 mg daily if/when posaconazole initiated)  -Twice weekly labs/transfusion   -Marrow at end of C1 on 1/3, followed by Dr. Vertell Limber on 1/5  ??  2) TLS prophylaxis  -Continue allopurinol 300 mg PO daily + PO hydration  ??  3) Infection prophylaxis  -Continue levofloxacin 500 mg PO daily (although ANC > 0.5, anticipate drop over course of next week)  -Continue valacyclovir 500 mg PO daily  -Will plan for posaconazole 300 mg PO daily if/when ANC<0.5 (and dose adjust venetoclax to 100 mg daily once started); patient has received drug from [facility] and is aware to HOLD until instructed to start.   ??  4) Supportive care   -Continue prochlorperazine 10 mg every 6 hours prn nausea  -Continue trazodone to 100 mg PO at bedtime PRN sleep (stoped nightly ZzzQuil as instructed)    F/u:  Future Appointments   Date Time Provider Department Center   02/21/2021 10:30 AM Virgil Benedict, AGNP [facility]   02/21/2021 11:30 AM ONCINF CHAIR 30 [facility]   02/24/2021 11:30 AM ADULT ONC LAB [facility]  02/24/2021 12:30 PM ONCINF CHAIR 47 [facility]   02/28/2021  9:30 AM ONCINF CHAIR 30 [facility]   03/03/2021  8:00 AM ADULT ONC LAB [facility]   03/03/2021  9:00 AM ONCHEM LEUKEMIA PHARMACIST [facility]   03/03/2021 10:00 AM ONCINF CHAIR 33 [facility]   03/08/2021 11:00 AM ADULT ONC LAB [facility]   03/08/2021 12:00 PM [employer], AGNP [facility]   03/08/2021  1:00 PM ONCINF CHAIR 31 [facility]   03/10/2021 11:30 AM ADULT ONC LAB [facility] 03/10/2021 12:30 PM ONCINF CHAIR 16 [facility]     I spent 30 minutes with Mr.Grainger Jr in direct patient care.    Ronnald Collum, PharmD, BCOP, CPP  Hematology/Oncology Clinical Pharmacist  Pager [phone]

## 2021-02-21 NOTE — Unmapped (Signed)
Leukemia Clinic Follow Up    Patient Name: Gary Baird  Patient Age: 80 y.o.  Encounter Date: 02/21/2021    Primary Care Provider:  [facility]    Referring Physician:  [facility]    Reason for visit:  Follow up for AML    Assessment:  Mr.  Gary Baird is a  80 y.o. 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 He was on monthly azacitidine for two years (since 09/2018) for management of his CMML, last given 12/30/20. He developed new GI bleeding and worsening leukocytosis and was admitted to [facility] for management on 01/29/21. Inpatient BMbx identified AML transformation with 53% blasts, normal cytogenetics, and mutations in ASXL1, SRSF2, and TET2.  He was started on hydrea for cytoreduction. He began treatment on aza/ven on 02/14/2021.  He presents to clinic today for day 8 cycle 1 follow up.     [name] is tolerating treatment on aza/ven well without complications or toxicities.  He has not had significant count reduction at this time.  Will hold on starting posaconazole.  He has this at home, but we have verified with his daughters that he has not started this.  For now he will continue with full dose venetoclax.  He should continue with levaquin, allopurinol and valtrex.  No blood product support needed today.  Continue with twice weekly lab monitoring.  RTC in 1 week for follow up.       Plan:  - continue venetoclax 400mg  daily  - continue levaquin, allopurinol and valtrex  - 2x/week lab monitoring  - weekly clinic visits for cycle 1  - BMbx 1/3  - RTC 1/5 for follow up with Dr. Vertell Baird      Dr. Vertell Baird was available    Arna Medici, AGNP-BC  Leukemia Research Nurse Practitioner  Hematology/Oncology Division  [facility]  02/21/2021     I personally spent 40 minutes face-to-face and non-face-to-face in the care of this patient, which includes all pre, intra, and post visit time on the date of service.    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)    Presentation: Incidentally found to have elevated WBC prior to procedure    Labs- WBC = 25.2  Hb = 8.2 g/dL  Platelets = 161  09% monocytes    Bone marrow biopsy- 09/04/2018  Hypercellular marrow- 70-90% cellular, 7% blasts by manual aspirate differential- monocytic appearance, granulocytic dysplasia, dysplasia of megakaryocytes    Final Diagnosis   Date Value Ref Range Status   09/04/2018   Final    (Outside Case #:  [id number], dated 09/04/2018)  Bone marrow,  aspiration and biopsy  -  Hypercellular bone marrow (90%) involved by chronic myelomonocytic leukemia-1 (9% blasts by manual aspirate differential) (see Comment)  -  Unifocal paratrabecular cluster of atypical CD17-positive cells (see Comment)  -  By outside report, cytogenetic analysis reveals a normal karyotype.  -  By outside report, next generation sequencing for myeloid disorders reveals pathogenic or likely pathogenic variants in CBL (p.C384Y), SRSF2 (p.P95H), TET2 (J.Y7829FAO*13), and SH2B3 (Y.Q657QIO*96).    Peripheral blood, smear review  -  Leukocytosis with absolute monocytosis  -  Normocytic anemia  -  Thrombocytosis    This electronic signature is attestation that the pathologist personally reviewed the submitted material(s) and the final diagnosis reflects that evaluation.         Flow  cytometry: No myeloblasts detectable, 16% monocytes- HLA-DR, CD11b, CD13, CD14, CD33, CD38, CD56, CD64 and without CD34 expression     Cytogenetics = Normal, 46,XY    Mutational Panel:   CBL C384Y- 19.0% VAF  SH2B3 S256Qfs- 58.8% VAF  SRSF2 P95H- 47.1% VAF  TET2 R1440- 86% VAF    Treatment: Azacitidine 75mg /m2 daily for 5 days monthly (09/30/18--12/31/20)        2. Diagnosis: Secondary AML     Developed hematochezia, fatigue, blurry vision, DOE at end of Nov 2022.  WBC 136, Hb 5.4, Pl 313, peripheral blasts 8%      Diagnosis   Date Value Ref Range Status   01/31/2021   Final    Bone marrow, right iliac, aspiration and biopsy  -  Hypercellular bone marrow (90%) consistent with involvement by acute myeloid leukemia (53% blasts by manual aspirate differential)  -  See linked reports for associated Ancillary Studies.      This electronic signature is attestation that the pathologist personally reviewed the submitted material(s) and the final diagnosis reflects that evaluation.          RESULTS   Date Value Ref Range Status   01/31/2021   Preliminary    Normal Karyotype: 46,XY[10]    Normal FISH:  An interphase FISH assay shows no evidence of a rearrangement involving the KMT2A (MLL) gene region in the 200 nuclei scored.          Myeloid Panel:   Variants of Known/Likely Clinical Significance:   Gene Coding Predicted Protein Variant allele fraction   ASXL1 c.2158del p.(Asp720Thrfs*5) 42.7 %   SRSF2 c.284C>A p.(Pro95His) 45.5 %   TET2 c.4317dupA p.(Arg1440Thrfs*38) 90.4 %     Cycle 1: 02/14/2021  Azacitidine 75mg /m2 x 7 days  Venetoclax 400mg  x 28 days     CMML (chronic myelomonocytic leukemia) (CMS-HCC)   09/19/2018 Initial Diagnosis    CMML (chronic myelomonocytic leukemia) (CMS-HCC)     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)   02/04/2021 Initial Diagnosis    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)     02/14/2021 -  Chemotherapy    OP AML AZACITIDINE + VENETOCLAX  azacitidine 75 mg/m2 SQ on days 1-7, venetoclax ramp up Week 1 (dose dependent), then azacitidine 75 mg/m2 SQ on Days 1-7 every 28 days      Chemotherapy    OP AML AZACITIDINE + VENETOCLAX      azacitidine 75 mg/m2 SQ on days 1-7, venetoclax ramp up Week 1 (dose dependent), then azacitidine 75 mg/m2 SQ on Days 1-7 every 28 days         INTERVAL HISTORY:  Since last seen Gary Baird reports that he continues to feel well.  He denies any n/v/d/c.  His appetite is good.   No new cough or sob.  No new fevers/chills. No new complaints today.     Otherwise, he denies new constitutional symptoms such as anorexia, weight loss, fatigue, night sweats or unexplained fevers.  Furthermore, he denies unexplained bleeding or bruising, recurrent or unexplained intercurrent infections, dyspnea on exertion, lightheadedness, palpitations or chest pain.  There have been no new or unexplained pains or self-identified masses, swelling or enlarged lymph nodes.    PAST MEDICAL HISTORY:  Past Medical History:   Diagnosis Date   ??? Cancer (CMS-HCC)    ??? Hypertension        MEDICATIONS:  Current Outpatient Medications   Medication Sig Dispense Refill   ??? allopurinol (ZYLOPRIM) 300 MG tablet  Take 1 tablet (300 mg total) by mouth daily. 30 tablet 2   ??? hydroCHLOROthiazide (HYDRODIURIL) 12.5 MG tablet Take 12.5 mg by mouth daily.     ??? melatonin 10 mg cap Take 10 mg by mouth nightly.     ??? prochlorperazine (COMPAZINE) 10 MG tablet Take 1 tablet (10 mg total) by mouth every six (6) hours as needed for nausea. 30 tablet 2   ??? valACYclovir (VALTREX) 500 MG tablet Take 500 mg by mouth daily.     ??? venetoclax (VENCLEXTA) 100 mg tablet Take 4 tablets (400 mg total) by mouth once daily. Take with a meal and water. Do not chew, crush, or break tablets. 120 tablet 0     No current facility-administered medications for this visit.     ALLERGIES:  No Known Allergies    SOCIAL HISTORY:  Social History     Social History Narrative   ??? Not on file          FAMILY HISTORY:  Family History   Problem Relation Age of Onset   ??? Alzheimer's disease Father    ??? Melanoma Neg Hx    ??? Basal cell carcinoma Neg Hx    ??? Squamous cell carcinoma Neg Hx        REVIEW OF SYSTEMS:  All other systems reviewed were negative    ECOG: 1    PHYSICAL EXAMINATION:  Vitals - 1 value per visit 02/21/2021   BP 111/59   Pulse 96   Temp 97.4   Resp 16   Weight (lb) 315 lbs   Weight (kg) 142.883 kg     Physical Exam:  General: Resting, in no apparent distress  HEENT:  PERRL. No scleral icterus or conjunctival injection.   Heart:  RRR.  S1, S2.  No murmurs, gallops or rubs. No edema noted  Lungs:  Breathing is unlabored, and patient is speaking full sentences with ease.  CTAB. No rales, ronchi or crackles.    Abdomen:  No distention or pain on palpation.  Bowel sounds are present. No palpable masses.  Skin:  No rashes, petechiae or purpura. Grossly intact.  Musculoskeletal: Range of motion about the shoulder, elbow, hips and knees is grossly normal.  Strength equal bilaterally.   Psychiatric:  Range of affect is appropriate.    Neurologic:  Alert and oriented x 4.  Steady gait.    LABORATORY DATA:  Lab on 02/21/2021   Component Date Value Ref Range Status   ??? Sodium 02/21/2021 138  135 - 145 mmol/L Final   ??? Potassium 02/21/2021 3.6  3.4 - 4.8 mmol/L Final   ??? Chloride 02/21/2021 97 (L)  98 - 107 mmol/L Final   ??? CO2 02/21/2021 32.0 (H)  20.0 - 31.0 mmol/L Final   ??? Anion Gap 02/21/2021 9  5 - 14 mmol/L Final   ??? BUN 02/21/2021 11  9 - 23 mg/dL Final   ??? Creatinine 02/21/2021 0.93  0.60 - 1.10 mg/dL Final   ??? BUN/Creatinine Ratio 02/21/2021 12   Final   ??? eGFR CKD-EPI (2021) Male 02/21/2021 82  >=60 mL/min/1.40m2 Final    eGFR calculated with CKD-EPI 2021 equation in accordance with [employer] and [employer] of Nephrology Task Force recommendations.   ??? Glucose 02/21/2021 109  70 - 179 mg/dL Final   ??? Calcium 16/12/9602 9.0  8.7 - 10.4 mg/dL Final   ??? Albumin 54/11/8117 3.2 (L)  3.4 - 5.0 g/dL Final   ??? Total Protein 02/21/2021  6.7  5.7 - 8.2 g/dL Final   ??? Total Bilirubin 02/21/2021 0.8  0.3 - 1.2 mg/dL Final   ??? AST 16/12/9602 11  <=34 U/L Final   ??? ALT 02/21/2021 <7 (L)  10 - 49 U/L Final   ??? Alkaline Phosphatase 02/21/2021 91  46 - 116 U/L Final   ??? WBC 02/21/2021 2.0 (L)  3.6 - 11.2 10*9/L Final   ??? RBC 02/21/2021 3.42 (L)  4.26 - 5.60 10*12/L Final   ??? HGB 02/21/2021 9.5 (L)  12.9 - 16.5 g/dL Final   ??? HCT 54/11/8117 28.6 (L)  39.0 - 48.0 % Final   ??? MCV 02/21/2021 83.5  77.6 - 95.7 fL Final   ??? MCH 02/21/2021 27.8  25.9 - 32.4 pg Final   ??? MCHC 02/21/2021 33.3  32.0 - 36.0 g/dL Final   ??? RDW 14/78/2956 18.1 (H)  12.2 - 15.2 % Final   ??? MPV 02/21/2021 7.2  6.8 - 10.7 fL Final   ??? Platelet 02/21/2021 65 (L)  150 - 450 10*9/L Final   ??? Neutrophils % 02/21/2021 68.3  % Final   ??? Lymphocytes % 02/21/2021 20.2  % Final   ??? Monocytes % 02/21/2021 9.4  % Final   ??? Eosinophils % 02/21/2021 1.6  % Final   ??? Basophils % 02/21/2021 0.5  % Final   ??? Absolute Neutrophils 02/21/2021 1.4 (L)  1.8 - 7.8 10*9/L Final   ??? Absolute Lymphocytes 02/21/2021 0.4 (L)  1.1 - 3.6 10*9/L Final   ??? Absolute Monocytes 02/21/2021 0.2 (L)  0.3 - 0.8 10*9/L Final   ??? Absolute Eosinophils 02/21/2021 0.0  0.0 - 0.5 10*9/L Final   ??? Absolute Basophils 02/21/2021 0.0  0.0 - 0.1 10*9/L Final   ??? Anisocytosis 02/21/2021 Slight (A)  Not Present Final

## 2021-02-24 ENCOUNTER — Ambulatory Visit: Admit: 2021-02-24 | Discharge: 2021-02-25 | Payer: MEDICARE

## 2021-02-24 ENCOUNTER — Encounter: Admit: 2021-02-24 | Discharge: 2021-02-25 | Payer: MEDICARE

## 2021-02-24 ENCOUNTER — Other Ambulatory Visit: Admit: 2021-02-24 | Discharge: 2021-02-25 | Payer: MEDICARE

## 2021-02-24 ENCOUNTER — Telehealth: Payer: Self-pay | Admitting: *Deleted

## 2021-02-24 DIAGNOSIS — C9310 Chronic myelomonocytic leukemia not having achieved remission: Principal | ICD-10-CM

## 2021-02-24 DIAGNOSIS — C9200 Acute myeloblastic leukemia, not having achieved remission: Principal | ICD-10-CM

## 2021-02-24 LAB — COMPREHENSIVE METABOLIC PANEL
ALBUMIN: 3.2 g/dL — ABNORMAL LOW (ref 3.4–5.0)
ALKALINE PHOSPHATASE: 110 U/L (ref 46–116)
ALT (SGPT): <7 U/L — ABNORMAL LOW (ref 10–49)
ANION GAP: 7 mmol/L (ref 5–14)
AST (SGOT): 10 U/L (ref <=34)
BILIRUBIN TOTAL: 0.6 mg/dL (ref 0.3–1.2)
BLOOD UREA NITROGEN: 8 mg/dL — ABNORMAL LOW (ref 9–23)
BUN / CREAT RATIO: 9
CALCIUM: 9.1 mg/dL (ref 8.7–10.4)
CHLORIDE: 100 mmol/L (ref 98–107)
CO2: 32 mmol/L — ABNORMAL HIGH (ref 20.0–31.0)
CREATININE: 0.85 mg/dL
EGFR CKD-EPI (2021) MALE: 87 mL/min/{1.73_m2} (ref >=60)
GLUCOSE RANDOM: 100 mg/dL (ref 70–179)
POTASSIUM: 3.4 mmol/L (ref 3.4–4.8)
PROTEIN TOTAL: 6.9 g/dL (ref 5.7–8.2)
SODIUM: 139 mmol/L (ref 135–145)

## 2021-02-24 LAB — CBC W/ AUTO DIFF
BASOPHILS ABSOLUTE COUNT: 0 10*9/L (ref 0.0–0.1)
BASOPHILS RELATIVE PERCENT: 0.6 %
EOSINOPHILS ABSOLUTE COUNT: 0 10*9/L (ref 0.0–0.5)
EOSINOPHILS RELATIVE PERCENT: 1 %
HEMATOCRIT: 30 % — ABNORMAL LOW (ref 39.0–48.0)
HEMOGLOBIN: 10 g/dL — ABNORMAL LOW (ref 12.9–16.5)
LYMPHOCYTES ABSOLUTE COUNT: 0.6 10*9/L — ABNORMAL LOW (ref 1.1–3.6)
LYMPHOCYTES RELATIVE PERCENT: 23.4 %
MEAN CORPUSCULAR HEMOGLOBIN CONC: 33.2 g/dL (ref 32.0–36.0)
MEAN CORPUSCULAR HEMOGLOBIN: 27.9 pg (ref 25.9–32.4)
MEAN CORPUSCULAR VOLUME: 84.1 fL (ref 77.6–95.7)
MEAN PLATELET VOLUME: 7.2 fL (ref 6.8–10.7)
MONOCYTES ABSOLUTE COUNT: 0.3 10*9/L (ref 0.3–0.8)
MONOCYTES RELATIVE PERCENT: 9.4 %
NEUTROPHILS ABSOLUTE COUNT: 1.8 10*9/L (ref 1.8–7.8)
NEUTROPHILS RELATIVE PERCENT: 65.6 %
PLATELET COUNT: 27 10*9/L — ABNORMAL LOW (ref 150–450)
RED BLOOD CELL COUNT: 3.57 10*12/L — ABNORMAL LOW (ref 4.26–5.60)
RED CELL DISTRIBUTION WIDTH: 18.8 % — ABNORMAL HIGH (ref 12.2–15.2)
WBC ADJUSTED: 2.7 10*9/L — ABNORMAL LOW (ref 3.6–11.2)

## 2021-02-24 NOTE — Telephone Encounter (Signed)
Pieter Partridge- in scheduling, brought me the patient's daughter's fmla form to complete. I will hold onto this form in the "pending file". Per Dr. Elroy Channel note- patient is receiving this treatments at [facility] at this time. Dr. Janese Banks will speak to Dr. Janene Madeira when she returns to determine follow-up at our cancer center. Please update me as soon as you know more and then I will complete the papers for the daughter. Thanks, Nira Conn, RN

## 2021-02-24 NOTE — Unmapped (Signed)
Lab on 02/24/2021   Component Date Value Ref Range Status    Sodium 02/24/2021 139  135 - 145 mmol/L Final    Potassium 02/24/2021 3.4  3.4 - 4.8 mmol/L Final    Chloride 02/24/2021 100  98 - 107 mmol/L Final    CO2 02/24/2021 32.0 (H)  20.0 - 31.0 mmol/L Final    Anion Gap 02/24/2021 7  5 - 14 mmol/L Final    BUN 02/24/2021 8 (L)  9 - 23 mg/dL Final    Creatinine 16/12/9602 0.85  0.60 - 1.10 mg/dL Final    BUN/Creatinine Ratio 02/24/2021 9   Final    eGFR CKD-EPI (2021) Male 02/24/2021 87  >=60 mL/min/1.50m2 Final    eGFR calculated with CKD-EPI 2021 equation in accordance with [employer] and [employer] of Nephrology Task Force recommendations.    Glucose 02/24/2021 100  70 - 179 mg/dL Final    Calcium 54/11/8117 9.1  8.7 - 10.4 mg/dL Final    Albumin 14/78/2956 3.2 (L)  3.4 - 5.0 g/dL Final    Total Protein 02/24/2021 6.9  5.7 - 8.2 g/dL Final    Total Bilirubin 02/24/2021 0.6  0.3 - 1.2 mg/dL Final    AST 21/30/8657 10  <=34 U/L Final    ALT 02/24/2021 <7 (L)  10 - 49 U/L Final    Alkaline Phosphatase 02/24/2021 110  46 - 116 U/L Final    Results Verified by Slide Scan 02/24/2021 Slide Reviewed   Final    WBC 02/24/2021 2.7 (L)  3.6 - 11.2 10*9/L Final    RBC 02/24/2021 3.57 (L)  4.26 - 5.60 10*12/L Final    HGB 02/24/2021 10.0 (L)  12.9 - 16.5 g/dL Final    HCT 84/69/6295 30.0 (L)  39.0 - 48.0 % Final    MCV 02/24/2021 84.1  77.6 - 95.7 fL Final    MCH 02/24/2021 27.9  25.9 - 32.4 pg Final    MCHC 02/24/2021 33.2  32.0 - 36.0 g/dL Final    RDW 28/41/3244 18.8 (H)  12.2 - 15.2 % Final    MPV 02/24/2021 7.2  6.8 - 10.7 fL Final    Platelet 02/24/2021 27 (L)  150 - 450 10*9/L Final    Neutrophils % 02/24/2021 65.6  % Final    Lymphocytes % 02/24/2021 23.4  % Final    Monocytes % 02/24/2021 9.4  % Final    Eosinophils % 02/24/2021 1.0  % Final    Basophils % 02/24/2021 0.6  % Final    Absolute Neutrophils 02/24/2021 1.8  1.8 - 7.8 10*9/L Final    Absolute Lymphocytes 02/24/2021 0.6 (L)  1.1 - 3.6 10*9/L Final    Absolute Monocytes 02/24/2021 0.3  0.3 - 0.8 10*9/L Final    Absolute Eosinophils 02/24/2021 0.0  0.0 - 0.5 10*9/L Final    Absolute Basophils 02/24/2021 0.0  0.0 - 0.1 10*9/L Final    Anisocytosis 02/24/2021 Slight (A)  Not Present Final

## 2021-02-24 NOTE — Unmapped (Unsigned)
PIV placed.  Labs drawn & sent for analysis. To next appt.  Care provided by Daniel Maxwell RN.

## 2021-02-24 NOTE — Unmapped (Signed)
Patient's lab results are above parameters for transfusion today. His PIV was removed and he was discharged.

## 2021-02-28 ENCOUNTER — Encounter: Admit: 2021-02-28 | Discharge: 2021-03-01 | Payer: MEDICARE

## 2021-02-28 ENCOUNTER — Ambulatory Visit: Admit: 2021-02-28 | Discharge: 2021-03-01 | Payer: MEDICARE

## 2021-02-28 DIAGNOSIS — C9310 Chronic myelomonocytic leukemia not having achieved remission: Secondary | ICD-10-CM | POA: Diagnosis not present

## 2021-02-28 DIAGNOSIS — C9200 Acute myeloblastic leukemia, not having achieved remission: Secondary | ICD-10-CM | POA: Diagnosis not present

## 2021-02-28 DIAGNOSIS — E876 Hypokalemia: Secondary | ICD-10-CM | POA: Diagnosis not present

## 2021-02-28 LAB — COMPREHENSIVE METABOLIC PANEL
ALBUMIN: 3.2 g/dL — ABNORMAL LOW (ref 3.4–5.0)
ALKALINE PHOSPHATASE: 120 U/L — ABNORMAL HIGH (ref 46–116)
ALT (SGPT): <7 U/L — ABNORMAL LOW (ref 10–49)
ANION GAP: 9 mmol/L (ref 5–14)
AST (SGOT): 10 U/L (ref <=34)
BILIRUBIN TOTAL: 0.6 mg/dL (ref 0.3–1.2)
BLOOD UREA NITROGEN: <5 mg/dL — ABNORMAL LOW (ref 9–23)
CALCIUM: 8.7 mg/dL (ref 8.7–10.4)
CHLORIDE: 102 mmol/L (ref 98–107)
CO2: 28 mmol/L (ref 20.0–31.0)
CREATININE: 0.78 mg/dL
EGFR CKD-EPI (2021) MALE: 90 mL/min/{1.73_m2} (ref >=60)
GLUCOSE RANDOM: 125 mg/dL (ref 70–179)
POTASSIUM: 3.1 mmol/L — ABNORMAL LOW (ref 3.4–4.8)
PROTEIN TOTAL: 6.8 g/dL (ref 5.7–8.2)
SODIUM: 139 mmol/L (ref 135–145)

## 2021-02-28 LAB — CBC W/ AUTO DIFF
BASOPHILS ABSOLUTE COUNT: 0 10*9/L (ref 0.0–0.1)
BASOPHILS RELATIVE PERCENT: 0.5 %
EOSINOPHILS ABSOLUTE COUNT: 0 10*9/L (ref 0.0–0.5)
EOSINOPHILS RELATIVE PERCENT: 1.6 %
HEMATOCRIT: 30 % — ABNORMAL LOW (ref 39.0–48.0)
HEMOGLOBIN: 10 g/dL — ABNORMAL LOW (ref 12.9–16.5)
LYMPHOCYTES ABSOLUTE COUNT: 0.5 10*9/L — ABNORMAL LOW (ref 1.1–3.6)
LYMPHOCYTES RELATIVE PERCENT: 19.1 %
MEAN CORPUSCULAR HEMOGLOBIN CONC: 33.3 g/dL (ref 32.0–36.0)
MEAN CORPUSCULAR HEMOGLOBIN: 27.9 pg (ref 25.9–32.4)
MEAN CORPUSCULAR VOLUME: 83.9 fL (ref 77.6–95.7)
MEAN PLATELET VOLUME: 8.1 fL (ref 6.8–10.7)
MONOCYTES ABSOLUTE COUNT: 0.3 10*9/L (ref 0.3–0.8)
MONOCYTES RELATIVE PERCENT: 8.8 %
NEUTROPHILS ABSOLUTE COUNT: 2 10*9/L (ref 1.8–7.8)
NEUTROPHILS RELATIVE PERCENT: 70 %
PLATELET COUNT: 28 10*9/L — ABNORMAL LOW (ref 150–450)
RED BLOOD CELL COUNT: 3.57 10*12/L — ABNORMAL LOW (ref 4.26–5.60)
RED CELL DISTRIBUTION WIDTH: 19.1 % — ABNORMAL HIGH (ref 12.2–15.2)
WBC ADJUSTED: 2.9 10*9/L — ABNORMAL LOW (ref 3.6–11.2)

## 2021-02-28 MED ADMIN — potassium chloride CR tablet 40 mEq: 40 meq | ORAL | @ 16:00:00 | Stop: 2021-02-28

## 2021-02-28 NOTE — Unmapped (Signed)
Per ordered parameters, patient did not need transfusion today. Oral potassium given per orders and patient discharged to home. AVS declined by patient.

## 2021-02-28 NOTE — Unmapped (Signed)
[facility] Note:    Paged re: K [email or web address]    Order placed for potassium chloride PO x 1. Will check labs again on Thursday 12/29 for need for potential home supplementation.       Lavonda Jumbo, AGNP-BC  [facility]

## 2021-02-28 NOTE — Unmapped (Signed)
Hospital Outpatient Visit on 02/28/2021   Component Date Value Ref Range Status    Antibody Screen 02/28/2021 NEG   Final    Blood Type 02/28/2021 B POS   Final    WBC 02/28/2021 2.9 (L)  3.6 - 11.2 10*9/L Final    RBC 02/28/2021 3.57 (L)  4.26 - 5.60 10*12/L Final    HGB 02/28/2021 10.0 (L)  12.9 - 16.5 g/dL Final    HCT 16/12/9602 30.0 (L)  39.0 - 48.0 % Final    MCV 02/28/2021 83.9  77.6 - 95.7 fL Final    MCH 02/28/2021 27.9  25.9 - 32.4 pg Final    MCHC 02/28/2021 33.3  32.0 - 36.0 g/dL Final    RDW 54/11/8117 19.1 (H)  12.2 - 15.2 % Final    MPV 02/28/2021 8.1  6.8 - 10.7 fL Final    Platelet 02/28/2021 28 (L)  150 - 450 10*9/L Final    Neutrophils % 02/28/2021 70.0  % Final    Lymphocytes % 02/28/2021 19.1  % Final    Monocytes % 02/28/2021 8.8  % Final    Eosinophils % 02/28/2021 1.6  % Final    Basophils % 02/28/2021 0.5  % Final    Absolute Neutrophils 02/28/2021 2.0  1.8 - 7.8 10*9/L Final    Absolute Lymphocytes 02/28/2021 0.5 (L)  1.1 - 3.6 10*9/L Final    Absolute Monocytes 02/28/2021 0.3  0.3 - 0.8 10*9/L Final    Absolute Eosinophils 02/28/2021 0.0  0.0 - 0.5 10*9/L Final    Absolute Basophils 02/28/2021 0.0  0.0 - 0.1 10*9/L Final    Anisocytosis 02/28/2021 Moderate (A)  Not Present Final    Sodium 02/28/2021 139  135 - 145 mmol/L Final    Potassium 02/28/2021 3.1 (L)  3.4 - 4.8 mmol/L Final    Chloride 02/28/2021 102  98 - 107 mmol/L Final    CO2 02/28/2021 28.0  20.0 - 31.0 mmol/L Final    Anion Gap 02/28/2021 9  5 - 14 mmol/L Final    BUN 02/28/2021 <5 (L)  9 - 23 mg/dL Final    Creatinine 14/78/2956 0.78  0.60 - 1.10 mg/dL Final    eGFR CKD-EPI (2021) Male 02/28/2021 90  >=60 mL/min/1.70m2 Final    eGFR calculated with CKD-EPI 2021 equation in accordance with [employer] and [employer] of Nephrology Task Force recommendations.    Glucose 02/28/2021 125  70 - 179 mg/dL Final    Calcium 21/30/8657 8.7  8.7 - 10.4 mg/dL Final    Albumin 84/69/6295 3.2 (L)  3.4 - 5.0 g/dL Final    Total Protein 02/28/2021 6.8  5.7 - 8.2 g/dL Final    Total Bilirubin 02/28/2021 0.6  0.3 - 1.2 mg/dL Final    AST 28/41/3244 10  <=34 U/L Final    ALT 02/28/2021 <7 (L)  10 - 49 U/L Final    Alkaline Phosphatase 02/28/2021 120 (H)  46 - 116 U/L Final

## 2021-03-03 ENCOUNTER — Ambulatory Visit: Admit: 2021-03-03 | Discharge: 2021-03-03 | Payer: MEDICARE

## 2021-03-03 ENCOUNTER — Other Ambulatory Visit: Admit: 2021-03-03 | Discharge: 2021-03-03 | Payer: MEDICARE

## 2021-03-03 DIAGNOSIS — C9200 Acute myeloblastic leukemia, not having achieved remission: Principal | ICD-10-CM

## 2021-03-03 LAB — CBC W/ AUTO DIFF
BASOPHILS ABSOLUTE COUNT: 0 10*9/L (ref 0.0–0.1)
BASOPHILS RELATIVE PERCENT: 0.6 %
EOSINOPHILS ABSOLUTE COUNT: 0.1 10*9/L (ref 0.0–0.5)
EOSINOPHILS RELATIVE PERCENT: 2.2 %
HEMATOCRIT: 30.1 % — ABNORMAL LOW (ref 39.0–48.0)
HEMOGLOBIN: 9.9 g/dL — ABNORMAL LOW (ref 12.9–16.5)
LYMPHOCYTES ABSOLUTE COUNT: 0.5 10*9/L — ABNORMAL LOW (ref 1.1–3.6)
LYMPHOCYTES RELATIVE PERCENT: 19 %
MEAN CORPUSCULAR HEMOGLOBIN CONC: 32.9 g/dL (ref 32.0–36.0)
MEAN CORPUSCULAR HEMOGLOBIN: 27.3 pg (ref 25.9–32.4)
MEAN CORPUSCULAR VOLUME: 83 fL (ref 77.6–95.7)
MEAN PLATELET VOLUME: 7.9 fL (ref 6.8–10.7)
MONOCYTES ABSOLUTE COUNT: 0.5 10*9/L (ref 0.3–0.8)
MONOCYTES RELATIVE PERCENT: 16.6 %
NEUTROPHILS ABSOLUTE COUNT: 1.7 10*9/L — ABNORMAL LOW (ref 1.8–7.8)
NEUTROPHILS RELATIVE PERCENT: 61.6 %
PLATELET COUNT: 200 10*9/L (ref 150–450)
RED BLOOD CELL COUNT: 3.63 10*12/L — ABNORMAL LOW (ref 4.26–5.60)
RED CELL DISTRIBUTION WIDTH: 20.9 % — ABNORMAL HIGH (ref 12.2–15.2)
WBC ADJUSTED: 2.8 10*9/L — ABNORMAL LOW (ref 3.6–11.2)

## 2021-03-03 LAB — COMPREHENSIVE METABOLIC PANEL
ALBUMIN: 3.2 g/dL — ABNORMAL LOW (ref 3.4–5.0)
ALKALINE PHOSPHATASE: 118 U/L — ABNORMAL HIGH (ref 46–116)
ALT (SGPT): <7 U/L — ABNORMAL LOW (ref 10–49)
ANION GAP: 8 mmol/L (ref 5–14)
AST (SGOT): 12 U/L (ref <=34)
BILIRUBIN TOTAL: 0.6 mg/dL (ref 0.3–1.2)
BLOOD UREA NITROGEN: 8 mg/dL — ABNORMAL LOW (ref 9–23)
BUN / CREAT RATIO: 10
CALCIUM: 8.7 mg/dL (ref 8.7–10.4)
CHLORIDE: 103 mmol/L (ref 98–107)
CO2: 29 mmol/L (ref 20.0–31.0)
CREATININE: 0.81 mg/dL
EGFR CKD-EPI (2021) MALE: 89 mL/min/{1.73_m2} (ref >=60)
GLUCOSE RANDOM: 113 mg/dL (ref 70–179)
POTASSIUM: 3.3 mmol/L — ABNORMAL LOW (ref 3.4–4.8)
PROTEIN TOTAL: 6.6 g/dL (ref 5.7–8.2)
SODIUM: 140 mmol/L (ref 135–145)

## 2021-03-03 NOTE — Unmapped (Signed)
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 reports he feels well today. Has been taking allopurinol daily and staying hydrated. Continues venetoclax 400 mg daily with breakfast without issue. He does have posaconazole but his daughter set this aside separately so he has not been taking it and knows not to do so until instructed. Denies fever, bleeding, nausea, diarrhea, pain, rashes. No SOB or dizziness. Appetite good. No mouth sores. Constipation from earlier in the cycle has resolved. Does struggle with insomnia, increasing trazodone to 100 mg did not help. Started melatonin but took 30 mg at bedtime. ZzzQuil has worked for him in the past.    On labs: WBC 2.8, Hgb 9.9, PLT 200, ANC 1.7. 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)    Presentation: Incidentally found to have elevated WBC prior to procedure    Labs- WBC = 25.2  Hb = 8.2 g/dL  Platelets = 161  09% monocytes    Bone marrow biopsy- 09/04/2018  Hypercellular marrow- 70-90% cellular, 7% blasts by manual aspirate differential- monocytic appearance, granulocytic dysplasia, dysplasia of megakaryocytes    Final Diagnosis   Date Value Ref Range Status   09/04/2018   Final    (Outside Case #:  [id number], dated 09/04/2018)  Bone marrow,  aspiration and biopsy  -  Hypercellular bone marrow (90%) involved by chronic myelomonocytic leukemia-1 (9% blasts by manual aspirate differential) (see Comment)  -  Unifocal paratrabecular cluster of atypical CD17-positive cells (see Comment)  -  By outside report, cytogenetic analysis reveals a normal karyotype.  -  By outside report, next generation sequencing for myeloid disorders reveals pathogenic or likely pathogenic variants in CBL (p.C384Y), SRSF2 (p.P95H), TET2 (J.Y7829FAO*13), and SH2B3 (Y.Q657QIO*96).    Peripheral blood, smear review  -  Leukocytosis with absolute monocytosis  -  Normocytic anemia  -  Thrombocytosis    This electronic signature is attestation that the pathologist personally reviewed the submitted material(s) and the final diagnosis reflects that evaluation.         Flow cytometry: No myeloblasts detectable, 16% monocytes- HLA-DR, CD11b, CD13, CD14, CD33, CD38, CD56, CD64 and without CD34 expression     Cytogenetics = Normal, 46,XY    Mutational Panel:   CBL C384Y- 19.0% VAF  SH2B3 S256Qfs- 58.8% VAF  SRSF2 P95H- 47.1% VAF  TET2 R1440- 86% VAF    Treatment: Azacitidine 75mg /m2 daily for 5 days monthly (09/30/18--12/31/20)        2. Diagnosis: Secondary AML     Developed hematochezia, fatigue, blurry vision, DOE at end of Nov 2022.  WBC 136, Hb 5.4, Pl 313, peripheral blasts 8%      Diagnosis   Date Value Ref Range Status   01/31/2021   Final    Bone marrow, right iliac, aspiration and biopsy  -  Hypercellular bone marrow (90%) consistent with involvement by acute myeloid leukemia (53% blasts  by manual aspirate differential)  -  See linked reports for associated Ancillary Studies.      This electronic signature is attestation that the pathologist personally reviewed the submitted material(s) and the final diagnosis reflects that evaluation.          RESULTS   Date Value Ref Range Status   01/31/2021   Preliminary    Normal Karyotype: 46,XY[10]    Normal FISH:  An interphase FISH assay shows no evidence of a rearrangement involving the KMT2A (MLL) gene region in the 200 nuclei scored.          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)     02/14/2021 -  Chemotherapy    OP AML AZACITIDINE + VENETOCLAX  azacitidine 75 mg/m2 SQ on days 1-7, venetoclax ramp up Week 1 (dose dependent), then azacitidine 75 mg/m2 SQ on Days 1-7 every 28 days      Chemotherapy    OP AML AZACITIDINE + VENETOCLAX      azacitidine 75 mg/m2 SQ on days 1-7, venetoclax ramp up Week 1 (dose dependent), then azacitidine 75 mg/m2 SQ on Days 1-7 every 28 days       Weight and Vitals:  Wt Readings from Last 3 Encounters:   03/03/21 (!) 140 kg (308 lb 9.6 oz)   02/21/21 (!) 142.9 kg (315 lb)   02/17/21 (!) 142.9 kg (315 lb 0.6 oz)     Temp Readings from Last 3 Encounters:   03/03/21 36.9 ??C (98.4 ??F) (Oral)   02/28/21 36.5 ??C (97.7 ??F) (Oral)   02/24/21 37.2 ??C (99 ??F) (Oral)     BP Readings from Last 3 Encounters:   03/03/21 113/58   02/28/21 110/64   02/24/21 135/60     Pulse Readings from Last 3 Encounters:   03/03/21 95   02/28/21 98   02/24/21 95     Pertinent Labs:  Lab on 03/03/2021   Component Date Value Ref Range Status   ??? Sodium 03/03/2021 140  135 - 145 mmol/L Final   ??? Potassium 03/03/2021 3.3 (L)  3.4 - 4.8 mmol/L Final   ??? Chloride 03/03/2021 103  98 - 107 mmol/L Final   ??? CO2 03/03/2021 29.0  20.0 - 31.0 mmol/L Final   ??? Anion Gap 03/03/2021 8  5 - 14 mmol/L Final   ??? BUN 03/03/2021 8 (L)  9 - 23 mg/dL Final   ??? Creatinine 03/03/2021 0.81  0.60 - 1.10 mg/dL Final   ??? BUN/Creatinine Ratio 03/03/2021 10   Final   ??? eGFR CKD-EPI (2021) Male 03/03/2021 89  >=60 mL/min/1.87m2 Final    eGFR calculated with CKD-EPI 2021 equation in accordance with [employer] and [employer] of Nephrology Task Force recommendations.   ??? Glucose 03/03/2021 113  70 - 179 mg/dL Final   ??? Calcium 47/82/9562 8.7  8.7 - 10.4 mg/dL Final   ??? Albumin 13/10/6576 3.2 (L)  3.4 - 5.0 g/dL Final   ??? Total Protein 03/03/2021 6.6  5.7 - 8.2 g/dL Final   ??? Total Bilirubin 03/03/2021 0.6  0.3 - 1.2 mg/dL Final   ??? AST 16/12/9602 12  <=34 U/L Final   ??? ALT 03/03/2021 <7 (L)  10 - 49 U/L Final   ??? Alkaline Phosphatase 03/03/2021 118 (H)  46 - 116 U/L Final   ??? WBC 03/03/2021 2.8 (L)  3.6 - 11.2 10*9/L Final   ??? RBC 03/03/2021 3.63 (L)  4.26 - 5.60 10*12/L Final   ??? HGB 03/03/2021 9.9 (L)  12.9 - 16.5 g/dL Final   ??? HCT 54/11/8117 30.1 (L)  39.0 - 48.0 % Final   ??? MCV 03/03/2021 83.0  77.6 - 95.7 fL Final   ??? MCH 03/03/2021 27.3  25.9 - 32.4 pg Final   ??? MCHC 03/03/2021 32.9  32.0 - 36.0 g/dL Final   ??? RDW 14/78/2956 20.9 (H)  12.2 - 15.2 % Final   ??? MPV 03/03/2021 7.9  6.8 - 10.7 fL Final   ??? Platelet 03/03/2021 200  150 - 450 10*9/L Final   ??? Neutrophils % 03/03/2021 61.6  % Final   ??? Lymphocytes % 03/03/2021 19.0  % Final   ??? Monocytes % 03/03/2021 16.6  % Final   ??? Eosinophils % 03/03/2021 2.2  % Final   ??? Basophils % 03/03/2021 0.6  % Final   ??? Absolute Neutrophils 03/03/2021 1.7 (L)  1.8 - 7.8 10*9/L Final   ??? Absolute Lymphocytes 03/03/2021 0.5 (L)  1.1 - 3.6 10*9/L Final   ??? Absolute Monocytes 03/03/2021 0.5  0.3 - 0.8 10*9/L Final   ??? Absolute Eosinophils 03/03/2021 0.1  0.0 - 0.5 10*9/L Final   ??? Absolute Basophils 03/03/2021 0.0  0.0 - 0.1 10*9/L Final   ??? Anisocytosis 03/03/2021 Moderate (A)  Not Present Final       Allergies: No Known Allergies    Drug Interactions: None identified at this time; avoid CYP3A4 and Pgp inhibitors/inducers with venetoclax. Of note, maximum dose of venetoclax with posaconazole is 100 mg/day.     Current Medications:  Current Outpatient Medications   Medication Sig Dispense Refill   ??? allopurinol (ZYLOPRIM) 300 MG tablet Take 1 tablet (300 mg total) by mouth daily. 30 tablet 2   ??? hydroCHLOROthiazide (HYDRODIURIL) 12.5 MG tablet Take 12.5 mg by mouth daily.     ??? melatonin 10 mg cap Take 10 mg by mouth nightly.     ??? pantoprazole (PROTONIX) 40 MG tablet Take 1 tablet (40 mg total) by mouth Two (2) times a day. (Patient taking differently: Take 40 mg by mouth daily.) 60 tablet 0   ??? prochlorperazine (COMPAZINE) 10 MG tablet Take 1 tablet (10 mg total) by mouth every six (6) hours as needed for nausea. 30 tablet 2   ??? valACYclovir (VALTREX) 500 MG tablet Take 500 mg by mouth daily.     ??? venetoclax (VENCLEXTA) 100 mg tablet Take 4 tablets (400 mg total) by mouth once daily. Take with a meal and water. Do not chew, crush, or break tablets. 120 tablet 0     No current facility-administered medications for this visit.     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 Mr. Borchardt is feeling well.  ANC remains above 0.5 so will continue venetoclax 400 mg daily dose and  hold off on starting posaconazole. No transfusion required today.     Plan:     1) AML  -Continue azacitidine + venetoclax C1D18 today, with venetoclax 400 mg daily  -Twice weekly labs/transfusion   -Marrow at end of C1 on 1/3, followed by Dr. Vertell Limber on 1/5  ??  2) TLS prophylaxis  -Continue allopurinol 300 mg PO daily + PO hydration  ??  3) Infection prophylaxis  -STOP levofloxacin, since ANC has been stable >0.5 he does not need tis  -Continue valacyclovir 500 mg PO daily  -If during C1 or C2 (if not in CR) can consider posaconazole 300 mg PO daily if/when ANC<0.5 (and dose adjust venetoclax to 100 mg daily once started); patient [facility] drug [facility] and is aware to HOLD until instructed to start. I removed from list, it is likely he won't need this cycle but I asked him to hang onto it  ??  4) Supportive care   -Continue prochlorperazine 10 mg every 6 hours prn nausea  -Removed trazodone from list as this is not working. He is trying melatonin but I asked him to reduce dose to 10 mg instead of 30 mg. ZzzQuil helps in past, but asked that he use this prn infrequently and not nightly.    F/u:  Future Appointments   Date Time Provider Department Center   03/08/2021 11:00 AM ADULT ONC LAB [facility]   03/08/2021 12:00 PM Virgil Benedict, AGNP [id number]   03/08/2021  1:00 PM ONCINF CHAIR 31 [id number]   03/10/2021 12:30 PM ADULT ONC LAB [facility]   03/10/2021  1:30 PM Avie Arenas, MD [id number]   03/10/2021  2:00 PM ONCINF CHAIR 24 [id number]     I spent 25 minutes with Mr.Markwardt Jr in direct patient care.      Manfred Arch, PharmD, BCOP, CPP  Pager: [phone]

## 2021-03-03 NOTE — Unmapped (Signed)
Continue to take venetoclax 400 mg (4 tablets) once daily with food.    We do NOT need to start posaconazole at this time, but hang onto it just in case.    You can STOP levofloxacin (antibiotic)    Let's try to add potassium rich foods into diet, like a banana daily, to see if we can increase potassium that way    For the fluid on your legs, you are on a water pill (hydrochlorothiazide). Also try to elevate feet when lounging, walk a bit to help mobilize the fluid, and minimize salty foods.    Lab on 03/03/2021   Component Date Value Ref Range Status    Sodium 03/03/2021 140  135 - 145 mmol/L Final    Potassium 03/03/2021 3.3 (L)  3.4 - 4.8 mmol/L Final    Chloride 03/03/2021 103  98 - 107 mmol/L Final    CO2 03/03/2021 29.0  20.0 - 31.0 mmol/L Final    Anion Gap 03/03/2021 8  5 - 14 mmol/L Final    BUN 03/03/2021 8 (L)  9 - 23 mg/dL Final    Creatinine 16/12/9602 0.81  0.60 - 1.10 mg/dL Final    BUN/Creatinine Ratio 03/03/2021 10   Final    eGFR CKD-EPI (2021) Male 03/03/2021 89  >=60 mL/min/1.28m2 Final    eGFR calculated with CKD-EPI 2021 equation in accordance with [employer] and [employer] of Nephrology Task Force recommendations.    Glucose 03/03/2021 113  70 - 179 mg/dL Final    Calcium 54/11/8117 8.7  8.7 - 10.4 mg/dL Final    Albumin 14/78/2956 3.2 (L)  3.4 - 5.0 g/dL Final    Total Protein 03/03/2021 6.6  5.7 - 8.2 g/dL Final    Total Bilirubin 03/03/2021 0.6  0.3 - 1.2 mg/dL Final    AST 21/30/8657 12  <=34 U/L Final    ALT 03/03/2021 <7 (L)  10 - 49 U/L Final    Alkaline Phosphatase 03/03/2021 118 (H)  46 - 116 U/L Final    WBC 03/03/2021 2.8 (L)  3.6 - 11.2 10*9/L Final    RBC 03/03/2021 3.63 (L)  4.26 - 5.60 10*12/L Final    HGB 03/03/2021 9.9 (L)  12.9 - 16.5 g/dL Final    HCT 84/69/6295 30.1 (L)  39.0 - 48.0 % Final    MCV 03/03/2021 83.0  77.6 - 95.7 fL Final    MCH 03/03/2021 27.3  25.9 - 32.4 pg Final    MCHC 03/03/2021 32.9  32.0 - 36.0 g/dL Final    RDW 28/41/3244 20.9 (H) 12.2 - 15.2 % Final    MPV 03/03/2021 7.9  6.8 - 10.7 fL Final    Platelet 03/03/2021 200  150 - 450 10*9/L Final    Neutrophils % 03/03/2021 61.6  % Final    Lymphocytes % 03/03/2021 19.0  % Final    Monocytes % 03/03/2021 16.6  % Final    Eosinophils % 03/03/2021 2.2  % Final    Basophils % 03/03/2021 0.6  % Final    Absolute Neutrophils 03/03/2021 1.7 (L)  1.8 - 7.8 10*9/L Final    Absolute Lymphocytes 03/03/2021 0.5 (L)  1.1 - 3.6 10*9/L Final    Absolute Monocytes 03/03/2021 0.5  0.3 - 0.8 10*9/L Final    Absolute Eosinophils 03/03/2021 0.1  0.0 - 0.5 10*9/L Final    Absolute Basophils 03/03/2021 0.0  0.0 - 0.1 10*9/L Final    Anisocytosis 03/03/2021 Moderate (A)  Not Present Final

## 2021-03-04 ENCOUNTER — Encounter: Payer: Self-pay | Admitting: *Deleted

## 2021-03-04 DIAGNOSIS — C9200 Acute myeloblastic leukemia, not having achieved remission: Principal | ICD-10-CM

## 2021-03-04 MED ORDER — VENETOCLAX 100 MG TABLET
ORAL_TABLET | Freq: Every day | ORAL | 0 refills | 30 days | Status: CP
Start: 2021-03-04 — End: ?

## 2021-03-04 NOTE — Unmapped (Signed)
[facility] Clinical Assessment & Refill Coordination Note    Gary Baird, DOB: 1940/09/04  Phone: [phone] (home)     All above HIPAA information was verified with patient's family member, daughter Gary Baird.     Was a Nurse, learning disability used for this call? No    Specialty Medication(s):   Hematology/Oncology: Venclexta     Current Outpatient Medications   Medication Sig Dispense Refill   ??? allopurinol (ZYLOPRIM) 300 MG tablet Take 1 tablet (300 mg total) by mouth daily. 30 tablet 2   ??? hydroCHLOROthiazide (HYDRODIURIL) 12.5 MG tablet Take 12.5 mg by mouth daily.     ??? melatonin 10 mg cap Take 10 mg by mouth nightly.     ??? pantoprazole (PROTONIX) 40 MG tablet Take 1 tablet (40 mg total) by mouth Two (2) times a day. (Patient taking differently: Take 40 mg by mouth daily.) 60 tablet 0   ??? prochlorperazine (COMPAZINE) 10 MG tablet Take 1 tablet (10 mg total) by mouth every six (6) hours as needed for nausea. 30 tablet 2   ??? valACYclovir (VALTREX) 500 MG tablet Take 500 mg by mouth daily.     ??? venetoclax (VENCLEXTA) 100 mg tablet Take 4 tablets (400 mg total) by mouth once daily. Take with a meal and water. Do not chew, crush, or break tablets. 120 tablet 0     No current facility-administered medications for this visit.        Changes to medications: Gary Baird reports no changes at this time.    No Known Allergies    Changes to allergies: No    SPECIALTY MEDICATION ADHERENCE     Venetoclax 100 mg: 11-12 days of medicine on hand     Medication Adherence    Patient reported X missed doses in the last month: 0  Specialty Medication: Venetoclax 100 mg - 4 tabs (400 mg) once daily (has not initiated posa at this time)  Patient is on additional specialty medications: No  Informant: child/children  Confirmed plan for next specialty medication refill: delivery by pharmacy  Refills needed for supportive medications: not needed          Specialty medication(s) dose(s) confirmed: Regimen is correct and unchanged.     Are there any concerns with adherence? No    Adherence counseling provided? Not needed    CLINICAL MANAGEMENT AND INTERVENTION      Clinical Benefit Assessment:    Do you feel the medicine is effective or helping your condition? Yes    Clinical Benefit counseling provided? Not needed    Adverse Effects Assessment:    Are you experiencing any side effects? Yes, patient reports experiencing some insomnia. Side effect counseling provided: NA - he spoke with CPP on 03/03/21    Are you experiencing difficulty administering your medicine? No    Quality of Life Assessment:    Quality of Life    Rheumatology  Oncology  2. On a scale of 1-10, how would you rate your ability to manage side effects associated with your specialty medication? (1=no issues, 10 = unable to take medication due to side effects): 2  Dermatology  Cystic Fibrosis          How many days over the past month did your Acute myeloid leukemia   keep you from your normal activities? For example, brushing your teeth or getting up in the morning. 0    Have you discussed this with your provider? Not needed  Acute Infection Status:    Acute infections noted within Epic:  No active infections  Patient reported infection: None    Therapy Appropriateness:    Is therapy appropriate and patient progressing towards therapeutic goals? Yes, therapy is appropriate and should be continued    DISEASE/MEDICATION-SPECIFIC INFORMATION      N/A    PATIENT SPECIFIC NEEDS     - Does the patient have any physical, cognitive, or cultural barriers? No    - Is the patient high risk? Yes, patient is taking oral chemotherapy. Appropriateness of therapy as been assessed    - Does the patient require a Care Management Plan? No     SOCIAL DETERMINANTS OF HEALTH     At the [facility], we have learned that life circumstances - like trouble affording food, housing, utilities, or transportation can affect the health of many of our patients.   That is why we wanted to ask: are you currently experiencing any life circumstances that are negatively impacting your health and/or quality of life? Patient declined to answer    Social Determinants of Health     Food Insecurity: No Food Insecurity   ??? Worried About Programme researcher, broadcasting/film/video in the Last Year: Never true   ??? Ran Out of Food in the Last Year: Never true   Tobacco Use: Low Risk    ??? Smoking Tobacco Use: Never   ??? Smokeless Tobacco Use: Never   ??? Passive Exposure: Not on file   Transportation Needs: No Transportation Needs   ??? Lack of Transportation (Medical): No   ??? Lack of Transportation (Non-Medical): No   Alcohol Use: Not on file   Housing/Utilities: Low Risk    ??? Within the past 12 months, have you ever stayed: outside, in a car, in a tent, in an overnight shelter, or temporarily in someone else's home (i.e. couch-surfing)?: No   ??? Are you worried about losing your housing?: No   ??? Within the past 12 months, have you been unable to get utilities (heat, electricity) when it was really needed?: No   Substance Use: Not on file   Financial Resource Strain: Medium Risk   ??? Difficulty of Paying Living Expenses: Somewhat hard   Physical Activity: Not on file   Health Literacy: Not on file   Stress: Not on file   Intimate Partner Violence: Not on file   Depression: Not on file   Social Connections: Not on file       Would you be willing to receive help with any of the needs that you have identified today? Not applicable       SHIPPING     Specialty Medication(s) to be Shipped:   Hematology/Oncology: Venclexta    Other medication(s) to be shipped: No additional medications requested for fill at this time     Changes to insurance: No    Delivery Scheduled: Yes, Expected medication delivery date: 03/11/21.  However, Rx request for refills was sent to the provider as there are none remaining.     Medication will be delivered via Next Day Courier to the confirmed prescription address in [facility].    The patient will receive a drug information handout for each medication shipped and additional FDA Medication Guides as required.  Verified that patient has previously received a Conservation officer, historic buildings and a Surveyor, mining.    The patient or caregiver noted above participated in the development of this care plan and knows that they can  request review of or adjustments to the care plan at any time.      All of the patient's questions and concerns have been addressed.    Breck Coons [facility] Specialty Pharmacist

## 2021-03-05 ENCOUNTER — Other Ambulatory Visit: Payer: Self-pay | Admitting: Oncology

## 2021-03-08 ENCOUNTER — Ambulatory Visit: Admit: 2021-03-08 | Discharge: 2021-03-09 | Payer: MEDICARE

## 2021-03-08 ENCOUNTER — Encounter: Admit: 2021-03-08 | Discharge: 2021-03-09 | Payer: MEDICARE

## 2021-03-08 ENCOUNTER — Other Ambulatory Visit: Admit: 2021-03-08 | Discharge: 2021-03-09 | Payer: MEDICARE

## 2021-03-08 DIAGNOSIS — C9310 Chronic myelomonocytic leukemia not having achieved remission: Principal | ICD-10-CM

## 2021-03-08 DIAGNOSIS — C9200 Acute myeloblastic leukemia, not having achieved remission: Principal | ICD-10-CM

## 2021-03-08 DIAGNOSIS — K922 Gastrointestinal hemorrhage, unspecified: Secondary | ICD-10-CM | POA: Diagnosis not present

## 2021-03-08 DIAGNOSIS — Z856 Personal history of leukemia: Secondary | ICD-10-CM | POA: Diagnosis not present

## 2021-03-08 LAB — CBC W/ AUTO DIFF
BASOPHILS ABSOLUTE COUNT: 0.1 10*9/L (ref 0.0–0.1)
BASOPHILS ABSOLUTE COUNT: 0 10*9/L (ref 0.0–0.1)
BASOPHILS RELATIVE PERCENT: 2.9 %
BASOPHILS RELATIVE PERCENT: 2.5 %
EOSINOPHILS ABSOLUTE COUNT: 0.1 10*9/L (ref 0.0–0.5)
EOSINOPHILS ABSOLUTE COUNT: 0.1 10*9/L (ref 0.0–0.5)
EOSINOPHILS RELATIVE PERCENT: 4.5 %
EOSINOPHILS RELATIVE PERCENT: 3 %
HEMATOCRIT: 31.8 % — ABNORMAL LOW (ref 39.0–48.0)
HEMATOCRIT: 31.9 % — ABNORMAL LOW (ref 39.0–48.0)
HEMOGLOBIN: 9.9 g/dL — ABNORMAL LOW (ref 12.9–16.5)
HEMOGLOBIN: 10.4 g/dL — ABNORMAL LOW (ref 12.9–16.5)
LYMPHOCYTES ABSOLUTE COUNT: 0.5 10*9/L — ABNORMAL LOW (ref 1.1–3.6)
LYMPHOCYTES ABSOLUTE COUNT: 0.6 10*9/L — ABNORMAL LOW (ref 1.1–3.6)
LYMPHOCYTES RELATIVE PERCENT: 26.8 %
LYMPHOCYTES RELATIVE PERCENT: 34.1 %
MEAN CORPUSCULAR HEMOGLOBIN CONC: 31.2 g/dL — ABNORMAL LOW (ref 32.0–36.0)
MEAN CORPUSCULAR HEMOGLOBIN CONC: 32.7 g/dL (ref 32.0–36.0)
MEAN CORPUSCULAR HEMOGLOBIN: 24.4 pg — ABNORMAL LOW (ref 25.9–32.4)
MEAN CORPUSCULAR HEMOGLOBIN: 25.6 pg — ABNORMAL LOW (ref 25.9–32.4)
MEAN CORPUSCULAR VOLUME: 78.1 fL (ref 77.6–95.7)
MEAN CORPUSCULAR VOLUME: 78.3 fL (ref 77.6–95.7)
MEAN PLATELET VOLUME: 7.3 fL (ref 6.8–10.7)
MEAN PLATELET VOLUME: 7.1 fL (ref 6.8–10.7)
MONOCYTES ABSOLUTE COUNT: 0.2 10*9/L — ABNORMAL LOW (ref 0.3–0.8)
MONOCYTES ABSOLUTE COUNT: 0.2 10*9/L — ABNORMAL LOW (ref 0.3–0.8)
MONOCYTES RELATIVE PERCENT: 11.6 %
MONOCYTES RELATIVE PERCENT: 9.7 %
NEUTROPHILS ABSOLUTE COUNT: 1 10*9/L — ABNORMAL LOW (ref 1.8–7.8)
NEUTROPHILS ABSOLUTE COUNT: 0.9 10*9/L — ABNORMAL LOW (ref 1.8–7.8)
NEUTROPHILS RELATIVE PERCENT: 54.2 %
NEUTROPHILS RELATIVE PERCENT: 50.7 %
PLATELET COUNT: 2292 10*9/L (ref 150–450)
PLATELET COUNT: 2164 10*9/L (ref 150–450)
RED BLOOD CELL COUNT: 4.07 10*12/L — ABNORMAL LOW (ref 4.26–5.60)
RED BLOOD CELL COUNT: 4.08 10*12/L — ABNORMAL LOW (ref 4.26–5.60)
RED CELL DISTRIBUTION WIDTH: 25.1 % — ABNORMAL HIGH (ref 12.2–15.2)
RED CELL DISTRIBUTION WIDTH: 24.2 % — ABNORMAL HIGH (ref 12.2–15.2)
WBC ADJUSTED: 1.8 10*9/L — ABNORMAL LOW (ref 3.6–11.2)
WBC ADJUSTED: 1.7 10*9/L — ABNORMAL LOW (ref 3.6–11.2)

## 2021-03-08 LAB — COMPREHENSIVE METABOLIC PANEL
ALBUMIN: 3.3 g/dL — ABNORMAL LOW (ref 3.4–5.0)
ALKALINE PHOSPHATASE: 113 U/L (ref 46–116)
ALT (SGPT): <7 U/L — ABNORMAL LOW (ref 10–49)
ANION GAP: 10 mmol/L (ref 5–14)
AST (SGOT): 13 U/L (ref <=34)
BILIRUBIN TOTAL: 0.6 mg/dL (ref 0.3–1.2)
BLOOD UREA NITROGEN: 7 mg/dL — ABNORMAL LOW (ref 9–23)
BUN / CREAT RATIO: 10
CALCIUM: 8.7 mg/dL (ref 8.7–10.4)
CHLORIDE: 99 mmol/L (ref 98–107)
CO2: 28 mmol/L (ref 20.0–31.0)
CREATININE: 0.73 mg/dL
EGFR CKD-EPI (2021) MALE: >90 mL/min/{1.73_m2} (ref >=60)
GLUCOSE RANDOM: 105 mg/dL (ref 70–179)
POTASSIUM: 3.3 mmol/L — ABNORMAL LOW (ref 3.4–4.8)
PROTEIN TOTAL: 7 g/dL (ref 5.7–8.2)
SODIUM: 137 mmol/L (ref 135–145)

## 2021-03-08 LAB — IRON PANEL
IRON SATURATION: 5 % — ABNORMAL LOW (ref 20–55)
IRON: 17 ug/dL — ABNORMAL LOW
TOTAL IRON BINDING CAPACITY: 312 ug/dL (ref 250–425)

## 2021-03-08 LAB — SLIDE REVIEW

## 2021-03-08 LAB — FERRITIN: FERRITIN: 17.4 ng/mL

## 2021-03-08 LAB — FACTOR VIII (VWF PANEL): FACTOR VIII ACTIVITY: 149 % (ref 56–186)

## 2021-03-08 MED ADMIN — LORazepam (ATIVAN) injection 1.5 mg: 1.5 mg | INTRAVENOUS | @ 18:00:00 | Stop: 2021-03-08

## 2021-03-08 NOTE — Unmapped (Signed)
Consent for procedure witnessed, time out completed.   Labs collected per orders, sent for analysis.   Premeds administered per orders. Line flushed. PIV remains in place till future plan of care decided.

## 2021-03-08 NOTE — Unmapped (Signed)
Please leave dressing in place and keep it dry for 24 hrs before removing. You can resume normal activities tomorrow, but take things easy today.     You may take tylenol as needed for discomfort at the area where the biopsy was taken.   After receiving Ativan, please do not drive today.     If you have questions between 8am to 5 pm Monday through Friday please call [phone] and speak to the operator.      For emergencies, evenings or weekends, please call [phone] and ask for oncology fellow on call.     Reasons to call emergency line may include:   Fever of 100.5 or greater   Nausea and/or vomiting not relieved with nausea medicine   Diarrhea or constipation   Severe pain not relieved with usual pain regimen       Hospital Outpatient Visit on 03/08/2021   Component Date Value Ref Range Status    WBC 03/08/2021 1.7 (L)  3.6 - 11.2 10*9/L Final    RBC 03/08/2021 4.08 (L)  4.26 - 5.60 10*12/L Final    HGB 03/08/2021 10.4 (L)  12.9 - 16.5 g/dL Final    HCT 29/56/2130 31.9 (L)  39.0 - 48.0 % Final    MCV 03/08/2021 78.3  77.6 - 95.7 fL Final    MCH 03/08/2021 25.6 (L)  25.9 - 32.4 pg Final    MCHC 03/08/2021 32.7  32.0 - 36.0 g/dL Final    RDW 86/57/8469 24.2 (H)  12.2 - 15.2 % Final    MPV 03/08/2021 7.1  6.8 - 10.7 fL Final    Platelet 03/08/2021 2,164 (HH)  150 - 450 10*9/L Final    Neutrophils % 03/08/2021 50.7  % Final    Lymphocytes % 03/08/2021 34.1  % Final    Monocytes % 03/08/2021 9.7  % Final    Eosinophils % 03/08/2021 3.0  % Final    Basophils % 03/08/2021 2.5  % Final    Absolute Neutrophils 03/08/2021 0.9 (L)  1.8 - 7.8 10*9/L Final    Absolute Lymphocytes 03/08/2021 0.6 (L)  1.1 - 3.6 10*9/L Final    Absolute Monocytes 03/08/2021 0.2 (L)  0.3 - 0.8 10*9/L Final    Absolute Eosinophils 03/08/2021 0.1  0.0 - 0.5 10*9/L Final    Absolute Basophils 03/08/2021 0.0  0.0 - 0.1 10*9/L Final    Microcytosis 03/08/2021 Slight (A)  Not Present Final    Anisocytosis 03/08/2021 Marked (A)  Not Present Final Hypochromasia 03/08/2021 Moderate (A)  Not Present Final   Lab on 03/08/2021   Component Date Value Ref Range Status    Sodium 03/08/2021 137  135 - 145 mmol/L Final    Potassium 03/08/2021 3.3 (L)  3.4 - 4.8 mmol/L Final    Chloride 03/08/2021 99  98 - 107 mmol/L Final    CO2 03/08/2021 28.0  20.0 - 31.0 mmol/L Final    Anion Gap 03/08/2021 10  5 - 14 mmol/L Final    BUN 03/08/2021 7 (L)  9 - 23 mg/dL Final    Creatinine 62/95/2841 0.73  0.60 - 1.10 mg/dL Final    BUN/Creatinine Ratio 03/08/2021 10   Final    eGFR CKD-EPI (2021) Male 03/08/2021 >90  >=60 mL/min/1.19m2 Final    eGFR calculated with CKD-EPI 2021 equation in accordance with [employer] and [employer] of Nephrology Task Force recommendations.    Glucose 03/08/2021 105  70 - 179 mg/dL Final  Calcium 03/08/2021 8.7  8.7 - 10.4 mg/dL Final    Albumin 29/56/2130 3.3 (L)  3.4 - 5.0 g/dL Final    Total Protein 03/08/2021 7.0  5.7 - 8.2 g/dL Final    Total Bilirubin 03/08/2021 0.6  0.3 - 1.2 mg/dL Final    AST 86/57/8469 13  <=34 U/L Final    ALT 03/08/2021 <7 (L)  10 - 49 U/L Final    Alkaline Phosphatase 03/08/2021 113  46 - 116 U/L Final    WBC 03/08/2021 1.8 (L)  3.6 - 11.2 10*9/L Final    RBC 03/08/2021 4.07 (L)  4.26 - 5.60 10*12/L Final    HGB 03/08/2021 9.9 (L)  12.9 - 16.5 g/dL Final    HCT 62/95/2841 31.8 (L)  39.0 - 48.0 % Final    MCV 03/08/2021 78.1  77.6 - 95.7 fL Final    MCH 03/08/2021 24.4 (L)  25.9 - 32.4 pg Final    MCHC 03/08/2021 31.2 (L)  32.0 - 36.0 g/dL Final    RDW 32/44/0102 25.1 (H)  12.2 - 15.2 % Final    MPV 03/08/2021 7.3  6.8 - 10.7 fL Final    Platelet 03/08/2021 2,292 (HH)  150 - 450 10*9/L Final    Confirmed by smear review    Neutrophils % 03/08/2021 54.2  % Final    Lymphocytes % 03/08/2021 26.8  % Final    Monocytes % 03/08/2021 11.6  % Final    Eosinophils % 03/08/2021 4.5  % Final    Basophils % 03/08/2021 2.9  % Final    Absolute Neutrophils 03/08/2021 1.0 (L)  1.8 - 7.8 10*9/L Final Absolute Lymphocytes 03/08/2021 0.5 (L)  1.1 - 3.6 10*9/L Final    Absolute Monocytes 03/08/2021 0.2 (L)  0.3 - 0.8 10*9/L Final    Absolute Eosinophils 03/08/2021 0.1  0.0 - 0.5 10*9/L Final    Absolute Basophils 03/08/2021 0.1  0.0 - 0.1 10*9/L Final    Microcytosis 03/08/2021 Slight (A)  Not Present Final    Anisocytosis 03/08/2021 Marked (A)  Not Present Final    Hypochromasia 03/08/2021 Marked (A)  Not Present Final    Antibody Screen 03/08/2021 NEG   Final    Blood Type 03/08/2021 B POS   Final    Smear Review Comments 03/08/2021 See Comment (A)  Undefined Final    [id number]  Dimorphic population.      Basophilic Stippling 03/08/2021 Present (A)  Not Present Final

## 2021-03-08 NOTE — Unmapped (Signed)
Hematology/Oncology Outpatient Call    Caller: [facility]  Primary oncologist: Vedh Ptacek is a 81 y.o. 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 He was on monthly azacitidine for two years (since 09/2018) for management of his CMML, last given 12/30/20. He developed new GI bleeding and worsening leukocytosis and was admitted to [facility] for management on 01/29/21. Inpatient BMbx identified AML transformation with 53% blasts, normal cytogenetics, and mutations in ASXL1, SRSF2, and TET2.  He was started on hydrea for cytoreduction. He began treatment on aza/ven on 02/14/2021.    Reason for Call:   Critical lab value, platelets of 2292.    Assessment/Plan:   This is an acute change in platelets.  Discussed with Dr. Bronson Curb nurse navigator Vick Frees, who will notify Dr. Vertell Limber and APP Arna Medici.      Fellow taking call:  Sherin Quarry  March 08, 2021 12:18 PM

## 2021-03-08 NOTE — Unmapped (Addendum)
81 y.o. 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 He was on monthly azacitidine for two years (since 09/2018) for management of his CMML, last given 12/30/20. He developed new GI bleeding and worsening leukocytosis and was admitted to [facility] for management on 01/29/21. Inpatient BMbx identified AML transformation per above and he was started on hydrea 2000 mg bid.     In the ED:  Afebrile  ANC 1.0 -> 0.9  PLT 2292>2164  Labs K 3.3, Cr:         Thrombocytosis:   PLT 2292        CMML-1  Secondary AML  Dr. Vertell Limber  Last visit 12/7

## 2021-03-08 NOTE — Unmapped (Unsigned)
PT in lab for PIV placement, 22 gauge placed in left forearm by [facility] LPN, labs obtained and sent for analysis.  Blood return brisk, saline locked.

## 2021-03-08 NOTE — Unmapped (Signed)
Hematology/Oncology Brief Admission Communication Note    Gary Baird is a 81 y.o. 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 He was on monthly azacitidine for two years (since 09/2018) for management of his CMML, last given 12/30/20. He developed new GI bleeding and worsening leukocytosis and was admitted to [facility] for management on 01/29/21. Inpatient BMbx identified AML transformation with 53% blasts, normal cytogenetics, and mutations in ASXL1, SRSF2, and TET2.  He was started on hydrea for cytoreduction. He began treatment on aza/ven on 02/14/2021.  Presents to clinic today for bone marrow biopsy     Reason for admission: He is being admitted today due to thrombocytosis with plt >2000    Plan for infusion center:   If the patient has an active treatment plan, chemotherapy to be administered: no  Infusions or supportive care ordered to be started in infusion: none  Additional testing/orders to be carried out prior to admission: Von Willebrand panel pending    The preliminary plan and goals of admission are: pheresis if needed.  Will need to determine disease status from pending marrow.       The primary outpatient oncologist for this patient is Tessa Lerner, MD and Oliver Hum, AGNP  The nurse navigator for this patient is Danice Goltz, RN    ADDENDUM:  After further discussion with Dr. Vertell Limber we have decided not to admit.  Iron studies and ferritin pending.  He will plan to follow up in clinic in 2 days as previously scheduled.

## 2021-03-08 NOTE — Unmapped (Signed)
Date of Service: 03/08/2021      Patient Active Problem List   Diagnosis   ??? CMML (chronic myelomonocytic leukemia) (CMS-HCC)   ??? Hemorrhagic shock (CMS-HCC)   ??? Vasovagal syncope   ??? Hypertension   ??? Coronary artery disease without angina pectoris   ??? AKI (acute kidney injury) (CMS-HCC)   ??? Hematochezia   ??? Melena   ??? Hemorrhoid   ??? Anemia   ??? 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)  C92.00 Hematopathology Order     Hematopathology Order     Cytogenetics Cancer/FISH NON-BLOOD     Cytogenetics Cancer/FISH NON-BLOOD     AML MRD, Flow, Bone Marrow (Sendout)     AML MRD, Flow, Bone Marrow (Sendout)     DNA Extract and Hold     DNA Extract and Hold     LORazepam (ATIVAN) injection 1.5 mg     lidocaine (PF) (XYLOCAINE-MPF) 20 mg/mL (2 %) injection 10 mL     CBC w/ Differential     von Willebrand Panel          Premedication: Ativan 1.5mg  IV  Driver confirmed: yes    Ordering Provider: Tessa Lerner, MD    Clinician(s) Performing Procedure: Arna Medici, AGNP        Bone Marrow Aspirate and Biopsy, right side    The procedure risks and alternatives of the procedure were explained to the patient.  The patient verbalized understanding and signed informed consent. After a time-out in which his patient identifiers were checked by 2 providers, the patient was laid in prone position on the table.   The  posterior superior iliac spine and iliac crest were cleaned, prepped and draped in the usual sterile fashion.     Anesthetic agent used: 2% plain lidocaine.      Utilizing a Ranfac needle, a bone marrow aspiration and biopsy was performed.  Specimen was sent for routine histopathologic stains and sectioning, flow cytometry, cytogenetics and molecular analysis.     A pressure dressing was applied to the biopsy site.  Patient tolerated the procedure well.  Hemostasis was confirmed upon discharge.     The patient was given verbal instructions for wound care, such as to keep the biopsy site dry, covered for 24 hours, and to call your physician for a temperature > 100.5.  Tylenol may be taken for discomfort.    Specimens Collected:  EDTA x 2   Heparin x 2  Core biopsy x 1

## 2021-03-08 NOTE — Unmapped (Signed)
Labs collected per orders, sent for analysis.   Line flushed; PIV discontinued, catheter tip intact, pressure dressing applied.   Pt is discharged from clinic in NAD, via wheelchair, down to the lobby, to be picked up by his daughter.

## 2021-03-09 NOTE — Unmapped (Signed)
AOC Triage Note     Patient: Gary Baird     Reason for call:  return call    Time call returned: 1715         Goal for this communication:  t daughter confused as to why it was decided to send the pt home after they were told he would be admitted over night for observation.     Triage Recommendations: RN contacted NP, Oliver Hum and explained to [facility] that after further test results and team collaboration, it was decided that the pt was safe to go home with planned follow up in 2 days as already scheduled.  Odella states understanding and appreciation for the information and return call.

## 2021-03-09 NOTE — Unmapped (Signed)
Hi,     Victorino Dike contacted the [employer] requesting to speak with the care team of Gary Baird to discuss:    Patient's daughter has questions concerning patient's discharge. She was under the impression that patient would be under overnight observation..    Please contact Odella at [phone].         Thank you,   Durward Fortes  [facility]   [phone]

## 2021-03-10 ENCOUNTER — Ambulatory Visit
Admit: 2021-03-10 | Discharge: 2021-03-10 | Payer: MEDICARE | Attending: Hematology & Oncology | Primary: Hematology & Oncology

## 2021-03-10 ENCOUNTER — Ambulatory Visit: Admit: 2021-03-10 | Discharge: 2021-03-10 | Payer: MEDICARE

## 2021-03-10 DIAGNOSIS — C9200 Acute myeloblastic leukemia, not having achieved remission: Principal | ICD-10-CM

## 2021-03-10 DIAGNOSIS — C9201 Acute myeloblastic leukemia, in remission: Secondary | ICD-10-CM | POA: Diagnosis not present

## 2021-03-10 LAB — VON WILLEBRAND FACTOR PANEL
VON WILLEBRAND AG: 168 %{normal} — ABNORMAL HIGH (ref 47–157)
VON WILLEBRAND FACTOR ACTIVITY: 119 %{normal} (ref 50–168)

## 2021-03-10 LAB — CBC W/ AUTO DIFF
BASOPHILS ABSOLUTE COUNT: 0.1 10*9/L (ref 0.0–0.1)
BASOPHILS RELATIVE PERCENT: 5 %
EOSINOPHILS ABSOLUTE COUNT: 0 10*9/L (ref 0.0–0.5)
EOSINOPHILS RELATIVE PERCENT: 4 %
HEMATOCRIT: 32 % — ABNORMAL LOW (ref 39.0–48.0)
HEMOGLOBIN: 10.1 g/dL — ABNORMAL LOW (ref 12.9–16.5)
LYMPHOCYTES ABSOLUTE COUNT: 0.5 10*9/L — ABNORMAL LOW (ref 1.1–3.6)
LYMPHOCYTES RELATIVE PERCENT: 37.4 %
MEAN CORPUSCULAR HEMOGLOBIN CONC: 31.4 g/dL — ABNORMAL LOW (ref 32.0–36.0)
MEAN CORPUSCULAR HEMOGLOBIN: 24.8 pg — ABNORMAL LOW (ref 25.9–32.4)
MEAN CORPUSCULAR VOLUME: 79 fL (ref 77.6–95.7)
MEAN PLATELET VOLUME: 7.2 fL (ref 6.8–10.7)
MONOCYTES ABSOLUTE COUNT: 0.1 10*9/L — ABNORMAL LOW (ref 0.3–0.8)
MONOCYTES RELATIVE PERCENT: 5.8 %
NEUTROPHILS ABSOLUTE COUNT: 0.6 10*9/L — ABNORMAL LOW (ref 1.8–7.8)
NEUTROPHILS RELATIVE PERCENT: 47.8 %
PLATELET COUNT: 2395 10*9/L (ref 150–450)
RED BLOOD CELL COUNT: 4.06 10*12/L — ABNORMAL LOW (ref 4.26–5.60)
RED CELL DISTRIBUTION WIDTH: 25.1 % — ABNORMAL HIGH (ref 12.2–15.2)
WBC ADJUSTED: 1.2 10*9/L — ABNORMAL LOW (ref 3.6–11.2)

## 2021-03-10 LAB — COMPREHENSIVE METABOLIC PANEL
ALBUMIN: 3.3 g/dL — ABNORMAL LOW (ref 3.4–5.0)
ALKALINE PHOSPHATASE: 109 U/L (ref 46–116)
ALT (SGPT): <7 U/L — ABNORMAL LOW (ref 10–49)
ANION GAP: 8 mmol/L (ref 5–14)
AST (SGOT): 12 U/L (ref <=34)
BILIRUBIN TOTAL: 0.5 mg/dL (ref 0.3–1.2)
BLOOD UREA NITROGEN: 7 mg/dL — ABNORMAL LOW (ref 9–23)
BUN / CREAT RATIO: 9
CALCIUM: 8.9 mg/dL (ref 8.7–10.4)
CHLORIDE: 100 mmol/L (ref 98–107)
CO2: 30 mmol/L (ref 20.0–31.0)
CREATININE: 0.8 mg/dL
EGFR CKD-EPI (2021) MALE: 89 mL/min/{1.73_m2} (ref >=60)
GLUCOSE RANDOM: 102 mg/dL (ref 70–179)
POTASSIUM: 3.2 mmol/L — ABNORMAL LOW (ref 3.4–4.8)
PROTEIN TOTAL: 7 g/dL (ref 5.7–8.2)
SODIUM: 138 mmol/L (ref 135–145)

## 2021-03-10 LAB — SLIDE REVIEW

## 2021-03-10 MED ORDER — FERROUS SULFATE 325 MG (65 MG IRON) TABLET,DELAYED RELEASE
ORAL_TABLET | Freq: Every day | ORAL | 3 refills | 60 days | Status: CP
Start: 2021-03-10 — End: 2021-03-10

## 2021-03-10 MED ORDER — FERROUS SULFATE 325 MG (65 MG IRON) TABLET
ORAL_TABLET | Freq: Every day | ORAL | 3 refills | 30 days | Status: CP
Start: 2021-03-10 — End: 2022-03-10

## 2021-03-10 MED ORDER — VENETOCLAX 100 MG TABLET
ORAL_TABLET | Freq: Every day | ORAL | 5 refills | 21.00000 days | Status: CP
Start: 2021-03-10 — End: 2021-03-10
  Filled 2021-03-11: qty 84, 28d supply, fill #0

## 2021-03-10 NOTE — Unmapped (Signed)
[facility] Follow-up  ??  Patient Name: Gary Baird  Patient Age:81 y.o.  Encounter Date: 03/10/2021    ??Primary Care Provider: Lyndon Code, MD  ??  Referring Physician: [facility]  ??  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 He was on monthly azacitidine for two years (since 09/2018) for management of his CMML, last given 12/30/20. He developed new GI bleeding and worsening leukocytosis and was admitted to [facility] for management on 01/29/21. BMbx identified progression to AML with 53% blasts, normal cytogenetics, and mutations in ASXL1, SRSF2, and TET2.  He was started on hydrea for cytoreduction. He began treatment on Aza/Ven on 02/14/2021. He is s/p cycle 1 Aza/Ven with BM Bx revealing morphologic remission. He presents to clinic for follow up appointment.    Gary Baird is doing well today. He tolerated cycle 1 of Aza/Ven very well without complications or toxicities. He denies any GI upset and reports feeling better than he did prior to treatment. We reviewed his recent bone marrow biopsy results, which showed <1% blasts, revealing CR. I congratulated him on this milestone! Given his good response to treatment, he will proceed with cycle 2 azacitidine 60m2/mg x 7 days and venetoclax 400mg  PO x 21 days on 03/14/21 at [facility] if ANC >=1,000. I would recommend that Gary Baird temporarily stop Venetoclax until Monday, 01/09. If ANC <1,000-> then he will require a 1-week delay for his next cycle of treatment.     We can plan to coordinate future treatment cycles to be done locally with [name] at [facility]. We reviewed his labs today, which continue to show thrombocytosis with platelet >2,000,000. I suspect this is a reactive thrombocytosis. Iron studies revealed iron deficiency anemia which may be contributing so will add Ferrous Sulfate supplementation. He will need labs once weekly and we will schedule these at [facility] until they can be scheduled locally. He will return to clinic in 1 month for APP visit and consideration of cycle 3.     COVID ppx: 3x COVID mRNA vaccine doses.    Plan and Recommendations:  1) Proceed with cycle 2 azacitidine 88m2/mg x 7 days and venetoclax 400mg  PO x 21 days on 03/14/21 at [facility] if ANC >1,000 otherwise will delay x 1 week  2) Stop Venetoclax today and restart on 03/14/21 for cycle 2- Venetoclax 400 mg x 21 days  3) Once weekly labs/transfusion. Currently scheduled at [facility].  4) Start ferrous sulfate 325mg  PO once daily.  5) RTC 04/07/21 for APP visit.  ______________________________________________________________________  ??  Documentation assistance was provided by Tracey Harries, Scribe, on March 09, 2021 at 1:30 PM for Jimmye Norman, MD    Documentation assistance provided by Medical Scribe, Tracey Harries who was present during the entirety of the visit. I reviewed the note below and validated all of the information provided to ensure accuracy and completeness.     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)    Presentation: Incidentally found to have elevated WBC prior to procedure    Labs- WBC = 25.2  Hb = 8.2 g/dL  Platelets = 161  09% monocytes    Bone marrow biopsy-  09/04/2018  Hypercellular marrow- 70-90% cellular, 7% blasts by manual aspirate differential- monocytic appearance, granulocytic dysplasia, dysplasia of megakaryocytes    Final Diagnosis   Date Value Ref Range Status   09/04/2018   Final    (Outside Case #:  [id number], dated 09/04/2018)  Bone marrow,  aspiration and biopsy  -  Hypercellular bone marrow (90%) involved by chronic myelomonocytic leukemia-1 (9% blasts by manual aspirate differential) (see Comment)  -  Unifocal paratrabecular cluster of atypical CD17-positive cells (see Comment)  -  By outside report, cytogenetic analysis reveals a normal karyotype.  -  By outside report, next generation sequencing for myeloid disorders reveals pathogenic or likely pathogenic variants in CBL (p.C384Y), SRSF2 (p.P95H), TET2 (N.[id number]*57), and SH2B3 (Q.[id number]*28).    Peripheral blood, smear review  -  Leukocytosis with absolute monocytosis  -  Normocytic anemia  -  Thrombocytosis    This electronic signature is attestation that the pathologist personally reviewed the submitted material(s) and the final diagnosis reflects that evaluation.         Flow cytometry: No myeloblasts detectable, 16% monocytes- HLA-DR, CD11b, CD13, CD14, CD33, CD38, CD56, CD64 and without CD34 expression     Cytogenetics = Normal, 46,XY    Mutational Panel:   CBL C384Y- 19.0% VAF  SH2B3 S256Qfs- 58.8% VAF  SRSF2 P95H- 47.1% VAF  TET2 R1440- 86% VAF    Treatment: Azacitidine 75mg /m2 daily for 5 days monthly (09/30/18--12/31/20)        2. Diagnosis: Secondary AML     Developed hematochezia, fatigue, blurry vision, DOE at end of Nov 2022.  WBC 136, Hb 5.4, Pl 313, peripheral blasts 8%      Diagnosis   Date Value Ref Range Status   01/31/2021   Final    Bone marrow, right iliac, aspiration and biopsy  -  Hypercellular bone marrow (90%) consistent with involvement by acute myeloid leukemia (53% blasts by manual aspirate differential)  -  See linked reports for associated Ancillary Studies.      This electronic signature is attestation that the pathologist personally reviewed the submitted material(s) and the final diagnosis reflects that evaluation.          RESULTS   Date Value Ref Range Status   01/31/2021   Preliminary    Normal Karyotype: 46,XY[10]    Normal FISH:  An interphase FISH assay shows no evidence of a rearrangement involving the KMT2A (MLL) gene region in the 200 nuclei scored.            Myeloid Panel:   Variants of Known/Likely Clinical Significance:   Gene Coding Predicted Protein Variant allele fraction   ASXL1 c.2158del p.(Asp720Thrfs*5) 42.7 %   SRSF2 c.284C>A p.(Pro95His) 45.5 %   TET2 c.4317dupA p.(Arg1440Thrfs*38) 90.4 %     Cycle 1: 02/14/2021  Azacitidine 75mg /m2 x 7 days  Venetoclax 400mg  x 28 days    03/08/21 Bone marrow, right iliac, aspiration and biopsy  -  Limited bone marrow sampling with increased megakaryocytes and less than 1% blasts on touch prep differential count (see Comment)   -  See linked reports for associated Ancillary Studies.    03/14/21: Cycle 2 Aza/Ven tentative start date.     CMML (chronic myelomonocytic leukemia) (CMS-HCC)   09/19/2018 Initial Diagnosis    CMML (chronic myelomonocytic leukemia) (CMS-HCC)     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)   02/04/2021 Initial Diagnosis    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)     02/14/2021 -  Chemotherapy    OP AML AZACITIDINE + VENETOCLAX  azacitidine 75 mg/m2 SQ on days 1-7, venetoclax ramp up Week 1 (dose dependent), then azacitidine 75 mg/m2 SQ on Days 1-7 every 28 days      Chemotherapy    OP AML AZACITIDINE + VENETOCLAX      azacitidine 75 mg/m2 SQ on days 1-7, venetoclax ramp up Week 1 (dose dependent), then azacitidine 75 mg/m2 SQ on Days 1-7 every 28 days       Interval History:  Mr. Guarisco presents today accompanied by his wife and is doing well. Since last seen here, he completed cycle 1 of Aza/Ven and tolerated it well. He denies any n/v/d. He reports feeling better than he did prior to treatment. He endorses swelling in his lower extremities intermittently, which is present today on exam. Otherwise, no new complaints.    Otherwise, he denies new constitutional symptoms such as anorexia, weight loss, night sweats or unexplained fevers.  Furthermore, he denies symptoms of marrow failure: unexplained bleeding or bruising, recurrent or unexplained intercurrent infections, dyspnea on exertion, lightheadedness, palpitations or chest pain.  There have been no new or unexplained pains or self-identified masses, swelling or enlarged lymph nodes.  ??  Past Medical, Surgical and Family History were reviewed and pertinent updates were made in the Electronic Medical Record  ??  Review of Systems:  Other than as reported above in the interim history, the balance of a full 12-system review was performed and unremarkable.  ??  ECOG Performance Status: 1    Weight and Vitals:  Wt Readings from Last 3 Encounters:   03/03/21 (!) 140 kg (308 lb 9.6 oz)   02/21/21 (!) 142.9 kg (315 lb)   02/17/21 (!) 142.9 kg (315 lb 0.6 oz)     Temp Readings from Last 3 Encounters:   03/08/21 36.8 ??C (98.2 ??F) (Oral)   03/03/21 36.9 ??C (98.4 ??F) (Oral)   02/28/21 36.5 ??C (97.7 ??F) (Oral)     BP Readings from Last 3 Encounters:   03/08/21 126/60   03/03/21 113/58   02/28/21 110/64     Pulse Readings from Last 3 Encounters:   03/08/21 94   03/03/21 95   02/28/21 98     Pertinent Labs:  Appointment on 03/10/2021   Component Date Value Ref Range Status   ??? Sodium 03/10/2021 138  135 - 145 mmol/L Final   ??? Potassium 03/10/2021 3.2 (L)  3.4 - 4.8 mmol/L Final   ??? Chloride 03/10/2021 100  98 - 107 mmol/L Final   ??? CO2 03/10/2021 30.0  20.0 - 31.0 mmol/L Final   ??? Anion Gap 03/10/2021 8  5 - 14 mmol/L Final   ??? BUN 03/10/2021 7 (L)  9 - 23 mg/dL Final   ??? Creatinine 03/10/2021 0.80  0.60 - 1.10 mg/dL Final   ??? BUN/Creatinine Ratio 03/10/2021 9   Final   ??? eGFR CKD-EPI (2021) Male 03/10/2021 89  >=60 mL/min/1.36m2 Final    eGFR calculated with CKD-EPI 2021 equation in accordance with [employer] and [employer] of Nephrology Task Force recommendations.   ??? Glucose 03/10/2021 102  70 - 179 mg/dL Final   ??? Calcium 29/56/2130 8.9  8.7 - 10.4 mg/dL Final   ??? Albumin 86/57/8469 3.3 (L)  3.4 - 5.0 g/dL Final   ??? Total Protein 03/10/2021 7.0  5.7 - 8.2 g/dL Final   ??? Total Bilirubin 03/10/2021 0.5  0.3 - 1.2 mg/dL Final   ??? AST 62/95/2841  12  <=34 U/L Final   ??? ALT 03/10/2021 <7 (L)  10 - 49 U/L Final   ??? Alkaline Phosphatase 03/10/2021 109  46 - 116 U/L Final ??? WBC 03/10/2021 1.2 (L)  3.6 - 11.2 10*9/L Final   ??? RBC 03/10/2021 4.06 (L)  4.26 - 5.60 10*12/L Final   ??? HGB 03/10/2021 10.1 (L)  12.9 - 16.5 g/dL Final   ??? HCT 09/81/1914 32.0 (L)  39.0 - 48.0 % Final   ??? MCV 03/10/2021 79.0  77.6 - 95.7 fL Final   ??? MCH 03/10/2021 24.8 (L)  25.9 - 32.4 pg Final   ??? MCHC 03/10/2021 31.4 (L)  32.0 - 36.0 g/dL Final   ??? RDW 78/29/5621 25.1 (H)  12.2 - 15.2 % Final   ??? MPV 03/10/2021 7.2  6.8 - 10.7 fL Final   ??? Platelet 03/10/2021 2,395 (HH)  150 - 450 10*9/L Final   ??? Neutrophils % 03/10/2021 47.8  % Final   ??? Lymphocytes % 03/10/2021 37.4  % Final   ??? Monocytes % 03/10/2021 5.8  % Final   ??? Eosinophils % 03/10/2021 4.0  % Final   ??? Basophils % 03/10/2021 5.0  % Final   ??? Absolute Neutrophils 03/10/2021 0.6 (L)  1.8 - 7.8 10*9/L Final   ??? Absolute Lymphocytes 03/10/2021 0.5 (L)  1.1 - 3.6 10*9/L Final   ??? Absolute Monocytes 03/10/2021 0.1 (L)  0.3 - 0.8 10*9/L Final   ??? Absolute Eosinophils 03/10/2021 0.0  0.0 - 0.5 10*9/L Final   ??? Absolute Basophils 03/10/2021 0.1  0.0 - 0.1 10*9/L Final   ??? Microcytosis 03/10/2021 Slight (A)  Not Present Final   ??? Anisocytosis 03/10/2021 Marked (A)  Not Present Final   ??? Hypochromasia 03/10/2021 Marked (A)  Not Present Final   ??? Smear Review Comments 03/10/2021 See Comment (A)  Undefined Final    [id number] Slide reviewed. Large platelets present.    ??? Giant Platelets 03/10/2021 Present (A)  Not Present Final   ??? Toxic Vacuolation 03/10/2021 Present (A)  Not Present Final       Allergies: No Known Allergies    Drug Interactions: None identified at this time; avoid CYP3A4 and Pgp inhibitors/inducers with venetoclax. Of note, maximum dose of venetoclax with posaconazole is 100 mg/day.     Current Medications:  Current Outpatient Medications   Medication Sig Dispense Refill   ??? allopurinol (ZYLOPRIM) 300 MG tablet Take 1 tablet (300 mg total) by mouth daily. 30 tablet 2   ??? ferrous sulfate 325 (65 FE) MG tablet Take 1 tablet (325 mg total) by mouth daily. 30 tablet 3   ??? hydroCHLOROthiazide (HYDRODIURIL) 12.5 MG tablet Take 12.5 mg by mouth daily.     ??? melatonin 10 mg cap Take 10 mg by mouth nightly.     ??? pantoprazole (PROTONIX) 40 MG tablet Take 1 tablet (40 mg total) by mouth Two (2) times a day. (Patient taking differently: Take 40 mg by mouth daily.) 60 tablet 0   ??? prochlorperazine (COMPAZINE) 10 MG tablet Take 1 tablet (10 mg total) by mouth every six (6) hours as needed for nausea. 30 tablet 2   ??? valACYclovir (VALTREX) 500 MG tablet Take 500 mg by mouth daily.     ??? venetoclax (VENCLEXTA) 100 mg tablet Take 4 tablets (400 mg) once daily for 21 days of each cycle. 84 tablet 5     No current facility-administered medications for this visit.       VITAL  SIGNS: There were no vitals taken for this visit.    Physical Exam:  GENERAL: Well appearing woman in no acute distress.  HEENT: Normocephalic, symmetric. EOMI. Sclera clear.  NECK: supple and non tender  LYMPH: no cervical, supraclavicular or axillary lymphadenopathy.  LUNGS: respirations even and unlabored.clear to ausculation bilaterally.  CARDIAC: RRR, no murmurs.   ABDOMEN: Soft, nontender, no hepatomegaly or masses  SKIN: No rash, ecchymoses, purpuric lesions noted.  EXTREMITIES: No upper or lower extremity edema, normal skin tone.  MSK: Spine is nontender.  NEURO: Alert, oriented, normal gait &coordination.    Relevant Laboratory, radiology and pathology results:  Recent Results (from the past 24 hour(s))   Comprehensive Metabolic Panel    Collection Time: 03/10/21 12:56 PM   Result Value Ref Range    Sodium 138 135 - 145 mmol/L    Potassium 3.2 (L) 3.4 - 4.8 mmol/L    Chloride 100 98 - 107 mmol/L    CO2 30.0 20.0 - 31.0 mmol/L    Anion Gap 8 5 - 14 mmol/L    BUN 7 (L) 9 - 23 mg/dL    Creatinine 1.61 0.96 - 1.10 mg/dL    BUN/Creatinine Ratio 9     eGFR CKD-EPI (2021) Male 89 >=60 mL/min/1.78m2    Glucose 102 70 - 179 mg/dL    Calcium 8.9 8.7 - 04.5 mg/dL    Albumin 3.3 (L) 3.4 - 5.0 g/dL Total Protein 7.0 5.7 - 8.2 g/dL    Total Bilirubin 0.5 0.3 - 1.2 mg/dL    AST 12 <=40 U/L    ALT <7 (L) 10 - 49 U/L    Alkaline Phosphatase 109 46 - 116 U/L   CBC w/ Differential    Collection Time: 03/10/21 12:56 PM   Result Value Ref Range    WBC 1.2 (L) 3.6 - 11.2 10*9/L    RBC 4.06 (L) 4.26 - 5.60 10*12/L    HGB 10.1 (L) 12.9 - 16.5 g/dL    HCT 98.1 (L) 19.1 - 48.0 %    MCV 79.0 77.6 - 95.7 fL    MCH 24.8 (L) 25.9 - 32.4 pg    MCHC 31.4 (L) 32.0 - 36.0 g/dL    RDW 47.8 (H) 29.5 - 15.2 %    MPV 7.2 6.8 - 10.7 fL    Platelet 2,395 (HH) 150 - 450 10*9/L    Neutrophils % 47.8 %    Lymphocytes % 37.4 %    Monocytes % 5.8 %    Eosinophils % 4.0 %    Basophils % 5.0 %    Absolute Neutrophils 0.6 (L) 1.8 - 7.8 10*9/L    Absolute Lymphocytes 0.5 (L) 1.1 - 3.6 10*9/L    Absolute Monocytes 0.1 (L) 0.3 - 0.8 10*9/L    Absolute Eosinophils 0.0 0.0 - 0.5 10*9/L    Absolute Basophils 0.1 0.0 - 0.1 10*9/L    Microcytosis Slight (A) Not Present    Anisocytosis Marked (A) Not Present    Hypochromasia Marked (A) Not Present   Morphology Review    Collection Time: 03/10/21 12:56 PM   Result Value Ref Range    Smear Review Comments See Comment (A) Undefined    Giant Platelets Present (A) Not Present    Toxic Vacuolation Present (A) Not Present     Cindra Presume, MD

## 2021-03-10 NOTE — Unmapped (Addendum)
Congratulations! Your recent bone marrow biopsy showed you are in remission.  You are due to start your second cycle of Azacitadine and Venetoclax next Monday, 03/14/21 here at [facility]. Future cycles can be done in in [place].  Stop taking Venetoclax today. Restart on Monday, 03/14/21. You will take this for 21 days and stop taking on 04/03/21.  You will need to have weekly labs done. We will schedule these here unless you can have them done locally.  Start iron supplement that I prescribed today.  You will return to clinic in 4 weeks for follow up with our nurse practitioner, Oliver Hum.      If you have questions or concerns at night or on the weekend, call the hospital operator at [phone] and ask to speak to the hematology/oncology fellow on call.     If you have questions or concerns on a week day during business hours, you may call the Nurse call line at [phone].    Appointment on 03/10/2021   Component Date Value Ref Range Status    Sodium 03/10/2021 138  135 - 145 mmol/L Final    Potassium 03/10/2021 3.2 (L)  3.4 - 4.8 mmol/L Final    Chloride 03/10/2021 100  98 - 107 mmol/L Final    CO2 03/10/2021 30.0  20.0 - 31.0 mmol/L Final    Anion Gap 03/10/2021 8  5 - 14 mmol/L Final    BUN 03/10/2021 7 (L)  9 - 23 mg/dL Final    Creatinine 29/56/2130 0.80  0.60 - 1.10 mg/dL Final    BUN/Creatinine Ratio 03/10/2021 9   Final    eGFR CKD-EPI (2021) Male 03/10/2021 89  >=60 mL/min/1.66m2 Final    eGFR calculated with CKD-EPI 2021 equation in accordance with [employer] and [employer] of Nephrology Task Force recommendations.    Glucose 03/10/2021 102  70 - 179 mg/dL Final    Calcium 86/57/8469 8.9  8.7 - 10.4 mg/dL Final    Albumin 62/95/2841 3.3 (L)  3.4 - 5.0 g/dL Final    Total Protein 03/10/2021 7.0  5.7 - 8.2 g/dL Final    Total Bilirubin 03/10/2021 0.5  0.3 - 1.2 mg/dL Final    AST 32/44/0102 12  <=34 U/L Final    ALT 03/10/2021 <7 (L)  10 - 49 U/L Final    Alkaline Phosphatase 03/10/2021 109  46 - 116 U/L Final    WBC 03/10/2021 1.2 (L)  3.6 - 11.2 10*9/L Final    RBC 03/10/2021 4.06 (L)  4.26 - 5.60 10*12/L Final    HGB 03/10/2021 10.1 (L)  12.9 - 16.5 g/dL Final    HCT 72/53/6644 32.0 (L)  39.0 - 48.0 % Final    MCV 03/10/2021 79.0  77.6 - 95.7 fL Final    MCH 03/10/2021 24.8 (L)  25.9 - 32.4 pg Final    MCHC 03/10/2021 31.4 (L)  32.0 - 36.0 g/dL Final    RDW 03/47/4259 25.1 (H)  12.2 - 15.2 % Final    MPV 03/10/2021 7.2  6.8 - 10.7 fL Final    Platelet 03/10/2021 2,395 (HH)  150 - 450 10*9/L Final    Neutrophils % 03/10/2021 47.8  % Final    Lymphocytes % 03/10/2021 37.4  % Final    Monocytes % 03/10/2021 5.8  % Final    Eosinophils % 03/10/2021 4.0  % Final    Basophils % 03/10/2021 5.0  % Final    Absolute Neutrophils 03/10/2021 0.6 (L)  1.8 -  7.8 10*9/L Final    Absolute Lymphocytes 03/10/2021 0.5 (L)  1.1 - 3.6 10*9/L Final    Absolute Monocytes 03/10/2021 0.1 (L)  0.3 - 0.8 10*9/L Final    Absolute Eosinophils 03/10/2021 0.0  0.0 - 0.5 10*9/L Final    Absolute Basophils 03/10/2021 0.1  0.0 - 0.1 10*9/L Final    Microcytosis 03/10/2021 Slight (A)  Not Present Final    Anisocytosis 03/10/2021 Marked (A)  Not Present Final    Hypochromasia 03/10/2021 Marked (A)  Not Present Final    Smear Review Comments 03/10/2021 See Comment (A)  Undefined Final    [id number] Slide reviewed. Large platelets present.     Giant Platelets 03/10/2021 Present (A)  Not Present Final    Toxic Vacuolation 03/10/2021 Present (A)  Not Present Final

## 2021-03-11 NOTE — Unmapped (Addendum)
Gary Baird 's venetoclax shipment will be sent out  as a result of a new dose for the medication has been received. Now taking 4 tablets daily x 21 days of 28 day cycle.    I have reached out to the patient  at [phone] and left a voicemail message.  We will reschedule the medication for the delivery date that the patient agreed upon.  We have confirmed the delivery date as 03/11/21, via same day courier. (same delivery date as before)    [name], PharmD  [facility]      03/11/21: Gary Baird (Daughter) returned call and above information was relayed.  She was aware of the change in directions.

## 2021-03-12 ENCOUNTER — Encounter: Admit: 2021-03-12 | Payer: MEDICARE

## 2021-03-12 ENCOUNTER — Encounter: Admit: 2021-03-12 | Discharge: 2021-03-20 | Disposition: A | Discharge status: Home / Self Care | Payer: MEDICARE

## 2021-03-12 ENCOUNTER — Ambulatory Visit: Admit: 2021-03-12 | Discharge: 2021-03-20 | Disposition: A | Discharge status: Home / Self Care | Payer: MEDICARE

## 2021-03-12 ENCOUNTER — Encounter: Admit: 2021-03-12 | Discharge: 2021-03-20 | Payer: MEDICARE

## 2021-03-12 DIAGNOSIS — Z79899 Other long term (current) drug therapy: Secondary | ICD-10-CM | POA: Diagnosis not present

## 2021-03-12 DIAGNOSIS — R55 Syncope and collapse: Secondary | ICD-10-CM | POA: Diagnosis not present

## 2021-03-12 DIAGNOSIS — D75839 Thrombocytosis, unspecified: Secondary | ICD-10-CM | POA: Diagnosis not present

## 2021-03-12 DIAGNOSIS — C9310 Chronic myelomonocytic leukemia not having achieved remission: Secondary | ICD-10-CM | POA: Diagnosis not present

## 2021-03-12 DIAGNOSIS — C9311 Chronic myelomonocytic leukemia, in remission: Secondary | ICD-10-CM | POA: Diagnosis not present

## 2021-03-12 DIAGNOSIS — K922 Gastrointestinal hemorrhage, unspecified: Secondary | ICD-10-CM | POA: Diagnosis not present

## 2021-03-12 DIAGNOSIS — K259 Gastric ulcer, unspecified as acute or chronic, without hemorrhage or perforation: Secondary | ICD-10-CM | POA: Diagnosis not present

## 2021-03-12 DIAGNOSIS — E876 Hypokalemia: Secondary | ICD-10-CM | POA: Diagnosis not present

## 2021-03-12 DIAGNOSIS — K921 Melena: Secondary | ICD-10-CM | POA: Diagnosis not present

## 2021-03-12 DIAGNOSIS — R251 Tremor, unspecified: Secondary | ICD-10-CM | POA: Diagnosis not present

## 2021-03-12 DIAGNOSIS — R197 Diarrhea, unspecified: Secondary | ICD-10-CM | POA: Diagnosis not present

## 2021-03-12 DIAGNOSIS — Z8719 Personal history of other diseases of the digestive system: Secondary | ICD-10-CM | POA: Diagnosis not present

## 2021-03-12 DIAGNOSIS — K219 Gastro-esophageal reflux disease without esophagitis: Secondary | ICD-10-CM | POA: Diagnosis not present

## 2021-03-12 DIAGNOSIS — R918 Other nonspecific abnormal finding of lung field: Secondary | ICD-10-CM | POA: Diagnosis not present

## 2021-03-12 DIAGNOSIS — C9201 Acute myeloblastic leukemia, in remission: Secondary | ICD-10-CM | POA: Diagnosis not present

## 2021-03-12 DIAGNOSIS — R710 Precipitous drop in hematocrit: Secondary | ICD-10-CM | POA: Diagnosis not present

## 2021-03-12 DIAGNOSIS — R7881 Bacteremia: Secondary | ICD-10-CM | POA: Diagnosis not present

## 2021-03-12 DIAGNOSIS — Z20822 Contact with and (suspected) exposure to covid-19: Secondary | ICD-10-CM | POA: Diagnosis not present

## 2021-03-12 DIAGNOSIS — R933 Abnormal findings on diagnostic imaging of other parts of digestive tract: Secondary | ICD-10-CM | POA: Diagnosis not present

## 2021-03-12 DIAGNOSIS — D500 Iron deficiency anemia secondary to blood loss (chronic): Secondary | ICD-10-CM | POA: Diagnosis not present

## 2021-03-12 DIAGNOSIS — K5520 Angiodysplasia of colon without hemorrhage: Secondary | ICD-10-CM | POA: Diagnosis not present

## 2021-03-12 DIAGNOSIS — I2510 Atherosclerotic heart disease of native coronary artery without angina pectoris: Secondary | ICD-10-CM | POA: Diagnosis not present

## 2021-03-12 DIAGNOSIS — I10 Essential (primary) hypertension: Secondary | ICD-10-CM | POA: Diagnosis not present

## 2021-03-12 DIAGNOSIS — K625 Hemorrhage of anus and rectum: Secondary | ICD-10-CM | POA: Diagnosis not present

## 2021-03-12 DIAGNOSIS — R0902 Hypoxemia: Secondary | ICD-10-CM | POA: Diagnosis not present

## 2021-03-12 DIAGNOSIS — Z9221 Personal history of antineoplastic chemotherapy: Secondary | ICD-10-CM | POA: Diagnosis not present

## 2021-03-12 LAB — COMPREHENSIVE METABOLIC PANEL
ALBUMIN: 3.3 g/dL — ABNORMAL LOW (ref 3.4–5.0)
ALKALINE PHOSPHATASE: 114 U/L (ref 46–116)
ALT (SGPT): <7 U/L — ABNORMAL LOW (ref 10–49)
ANION GAP: 7 mmol/L (ref 5–14)
AST (SGOT): 11 U/L (ref <=34)
BILIRUBIN TOTAL: 0.5 mg/dL (ref 0.3–1.2)
BLOOD UREA NITROGEN: 7 mg/dL — ABNORMAL LOW (ref 9–23)
BUN / CREAT RATIO: 9
CALCIUM: 8.8 mg/dL (ref 8.7–10.4)
CHLORIDE: 102 mmol/L (ref 98–107)
CO2: 29.4 mmol/L (ref 20.0–31.0)
CREATININE: 0.74 mg/dL
EGFR CKD-EPI (2021) MALE: >90 mL/min/{1.73_m2} (ref >=60)
GLUCOSE RANDOM: 122 mg/dL (ref 70–179)
POTASSIUM: 3.4 mmol/L (ref 3.4–4.8)
PROTEIN TOTAL: 6.7 g/dL (ref 5.7–8.2)
SODIUM: 138 mmol/L (ref 135–145)

## 2021-03-12 LAB — CBC W/ AUTO DIFF
BASOPHILS ABSOLUTE COUNT: 0.1 10*9/L (ref 0.0–0.1)
BASOPHILS ABSOLUTE COUNT: 0.1 10*9/L (ref 0.0–0.1)
BASOPHILS RELATIVE PERCENT: 5.7 %
BASOPHILS RELATIVE PERCENT: 9.2 %
EOSINOPHILS ABSOLUTE COUNT: 0 10*9/L (ref 0.0–0.5)
EOSINOPHILS ABSOLUTE COUNT: 0 10*9/L (ref 0.0–0.5)
EOSINOPHILS RELATIVE PERCENT: 3.6 %
EOSINOPHILS RELATIVE PERCENT: 2.5 %
HEMATOCRIT: 29.8 % — ABNORMAL LOW (ref 39.0–48.0)
HEMATOCRIT: 23.6 % — ABNORMAL LOW (ref 39.0–48.0)
HEMOGLOBIN: 9.6 g/dL — ABNORMAL LOW (ref 12.9–16.5)
HEMOGLOBIN: 7.5 g/dL — ABNORMAL LOW (ref 12.9–16.5)
LYMPHOCYTES ABSOLUTE COUNT: 0.4 10*9/L — ABNORMAL LOW (ref 1.1–3.6)
LYMPHOCYTES ABSOLUTE COUNT: 0.3 10*9/L — ABNORMAL LOW (ref 1.1–3.6)
LYMPHOCYTES RELATIVE PERCENT: 34.5 %
LYMPHOCYTES RELATIVE PERCENT: 32.6 %
MEAN CORPUSCULAR HEMOGLOBIN CONC: 32.2 g/dL (ref 32.0–36.0)
MEAN CORPUSCULAR HEMOGLOBIN CONC: 31.8 g/dL — ABNORMAL LOW (ref 32.0–36.0)
MEAN CORPUSCULAR HEMOGLOBIN: 25.5 pg — ABNORMAL LOW (ref 25.9–32.4)
MEAN CORPUSCULAR HEMOGLOBIN: 24.9 pg — ABNORMAL LOW (ref 25.9–32.4)
MEAN CORPUSCULAR VOLUME: 79.1 fL (ref 77.6–95.7)
MEAN CORPUSCULAR VOLUME: 78.2 fL (ref 77.6–95.7)
MEAN PLATELET VOLUME: 6.9 fL (ref 6.8–10.7)
MEAN PLATELET VOLUME: 7 fL (ref 6.8–10.7)
MONOCYTES ABSOLUTE COUNT: 0.1 10*9/L — ABNORMAL LOW (ref 0.3–0.8)
MONOCYTES ABSOLUTE COUNT: 0.1 10*9/L — ABNORMAL LOW (ref 0.3–0.8)
MONOCYTES RELATIVE PERCENT: 6.6 %
MONOCYTES RELATIVE PERCENT: 11.4 %
NEUTROPHILS ABSOLUTE COUNT: 0.6 10*9/L — ABNORMAL LOW (ref 1.8–7.8)
NEUTROPHILS ABSOLUTE COUNT: 0.5 10*9/L — ABNORMAL LOW (ref 1.8–7.8)
NEUTROPHILS RELATIVE PERCENT: 49.6 %
NEUTROPHILS RELATIVE PERCENT: 44.3 %
NUCLEATED RED BLOOD CELLS: 1 /100{WBCs} (ref <=4)
PLATELET COUNT: 2315 10*9/L (ref 150–450)
PLATELET COUNT: 1776 10*9/L (ref 150–450)
RED BLOOD CELL COUNT: 3.77 10*12/L — ABNORMAL LOW (ref 4.26–5.60)
RED BLOOD CELL COUNT: 3.01 10*12/L — ABNORMAL LOW (ref 4.26–5.60)
RED CELL DISTRIBUTION WIDTH: 25.7 % — ABNORMAL HIGH (ref 12.2–15.2)
RED CELL DISTRIBUTION WIDTH: 23.9 % — ABNORMAL HIGH (ref 12.2–15.2)
WBC ADJUSTED: 1.1 10*9/L — ABNORMAL LOW (ref 3.6–11.2)
WBC ADJUSTED: 1.1 10*9/L — ABNORMAL LOW (ref 3.6–11.2)

## 2021-03-12 LAB — HEMOGLOBIN AND HEMATOCRIT, BLOOD
HEMATOCRIT: 23.7 % — ABNORMAL LOW (ref 39.0–48.0)
HEMOGLOBIN: 7.8 g/dL — ABNORMAL LOW (ref 12.9–16.5)

## 2021-03-12 LAB — SLIDE REVIEW

## 2021-03-12 MED ADMIN — pantoprazole (PROTONIX) injection 40 mg: 40 mg | INTRAVENOUS | @ 16:00:00 | Stop: 2021-03-12

## 2021-03-12 NOTE — Unmapped (Addendum)
Hospital course:  Gary Baird is a 81 y.o. 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     Recurrent GI bleed; hx hemorrhoids:   Per chart review, patient was recently admitted for hemorrhagic shock 01/29/2021-02/07/2021 where he was found to have transformation of his CMML to acute leukemia. During his admission, he was noted to have history of frequent bright red bowel movements with a 9 point drop in Hb over 1 month. He has known history of gastric ulcers however he is on a daily PPI. CT A/P 11/26 was negative for bleed as well as prior colonoscopies.  Ended up receiving upper endoscopy, colonoscopy and capsule endoscopy only significant for angiectasia's within the small bowel, likely the source of his recurrent bleeding. Current presentation similar to prior episodes.    Capsule endoscopy showed AVM however on planned balloon enteroscopy unable to visualize AVM.  Plan for outpatient follow-up upon discharge.  Plan to continue home PPI.  Added tranexamic acid 1300 mg 3 times daily.  Patient was transfused throughout his hospital stay.  Plan for 2 times weekly follow-up for transfusions as needed.    Thrombocytosis:   C/f acquired Von Willebrand syndrome (AVWS) 2/2 thrombocytosis:  Pt with extreme/acute thrombocytosis, PLT>2000, previously 100s in December. Potentially reactive thrombocytosis given iron deficiency anemia/blood loss. Can also be due to infection - Viral, bacterial, but less likely given lack of clear infectious source/afebrile. Significant thrombocytosis can be associated with increased risk of bleeding due to acquired von Willebrand Syndrome (above).  1/3 von Willebrand panel not consistent with von Willebrand's disease repeat this admission also showed appropriate function.  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 On monthly azacitidine since 09/2018 for CMML, stopped 12/30/20. Patient developed GI bleeding and worsening leukocytosis and was admitted to [facility] for management on 01/29/21. BMBx identified progression to AML. Started Aza/Ven 02/14/2021. S/p C1 Aza/Ven with BMBx revealing morphologic remission. Recent bone marrow biopsy results, showed <1% blasts, revealing CR. Due for cycle 2 Aza/Ven on 1/9. Per 1/5 note from Dr. Vertell Limber, if ANC </=1,000, patient will require a 1-week delay for his next cycle of treatment.  On discharge ANC greater than 1000.  Plan to arrange follow-up to plan is to startcycle 2 Aza/Ven on 03/17/2021.  Coordinate future treatment cycles to be done locally with [name] at [facility].    Hypoxia- tremors- fever  Approximately 5 PM on 03/17/2021 patient developed tremulousness.  Family relates that he felt overwhelmed however discontinued for 10 to 20 minutes.  He was given 1 dose of Ativan did not relieve his tremulousness.  At that time his other vital signs were stable.  At approximately 530 patient developed fever as well as new oxygen requirement.  Differential included volume overload, hospital-acquired infection, postoperative complications.  CTA negative, however representing primary bronchovascular groundglass nodules and patchy consolidations which could be representative of infection versus aspiration.  White count was increased to 14.7 with an ANC of 9.3.  Patient has been afebrile since that time with stable blood pressures.  Patient weaned from 2 L low flow nasal cannula to room air well.  Blood cultures no growth to date.  Follow blood cultures to 48 hours, no growth to date.  Transition from Zosyn to ciprofloxacin plus Flagyl plan to continue for 5 days upon discharge.  Most likely representative of transitory bacteremia from translocation across gut.     Iron deficiency anemia:  Normocytic anemia:  Iron studies revealed iron deficiency anemia. He was started on ferrous sulfate last visit. Started on Ferrous Sulfate supplementation at last clinic visit, however iron sulfate supplementation was stopped and discontinued during this admission as he is receiving regular blood transfusions.    Hypokalemia: K3.3 1/15, repleted prior to discharge. Discharged on potassium 20 mg BID x 3 days until next follow-up appointment.    GERD:  Continue pantoprazole 40 mg BID  Hypertension:  - Hold home hydrochlorothiazide 12.5 mg daily, restart on discharge  Other/chronic:  - Melatonin 9 mg nightly PRN

## 2021-03-12 NOTE — Unmapped (Signed)
Pt stating bloody stools x2. Pt was admitted in November 22 for similar reasons. Pt stating he has had a colonoscopy since and they didn't find anything. No pain, dizziness, or lightheadedness.

## 2021-03-12 NOTE — Unmapped (Signed)
[facility]  Emergency Department Provider Note      ED Clinical Impression      Final diagnoses:   Gastrointestinal hemorrhage, unspecified gastrointestinal hemorrhage type (Primary)            Impression, Medical Decision Making, Progress Notes and Critical Care      Impression, Differential Diagnosis and Plan of Care    Patient is a 81 y.o. 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     On exam, the patient appears well, requesting food. VS are remarkable for tachycardia to 110 but are otherwise WNL. Concern for GI bleed given undergarments and report.     Differential includes upper gi bleed from PUD vs atrophic area as noted on endoscopy. Less likely diverticular bleed, new fistula    BMP ordered in triage unremarkable. CBC with WBC of 1.1. HGB decreased to 9.6 slightly decreased from baseline (10.1 1/5). Thrombocytosis to 2,315 consistent with baseline. Neutropenia to 0.6.    Will plan on cardiac monitoring, repeat H&H and monitoring for further bowel movements. Will plan for period of observation in the ED and if he becomes more symptomatic, plan on admission. Updated patient and family member of the same.     Additional Progress Notes    ED Course as of 03/14/21 1037   Sat Mar 12, 2021   1114 Patient with larger dark red bowel movement in the ED. Will give protonix. Will send off GI pathogen panel.      2:30 PM Repeat Hg with further drop, now new transfusion level. Patient appears well appearing still, somewhat more symptomatic with movement. Will plan to prepare rbcs and plan for admission.       Portions of this record have been created using Scientist, clinical (histocompatibility and immunogenetics). Dictation errors have been sought, but may not have been identified and corrected.    See chart and resident provider documentation for details.    ____________________________________________         History        Reason for Visit  Blood in Stool      HPI   Gary Baird is a 81 y.o. 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 Patient reports 2 episodes of dark red bloody stools this morning upon waking up. He additionally endorses several episodes of diarrhea since last night 2/2 taking Dulcolax. No pain associated with bowel movements. He notes history of intermittent bloody stools for the past several years with several colonoscopies and endoscopies negative for any source other than hemorrhoids. Chart review reveals recent admission 11/26-12/5 where colonoscopy did not show acute bleed and he was discharged on BID oral PPI. He states since recent discharge, he has not had any episodes of bloody stools until this morning. No recent missed doses of his PPI. 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 Patient denies fever, chills, abdominal pain, nausea, vomiting, melena, chest pain, or shortness of breath.     No obvious lesions to buttock, some dried blood on undergarments     Outside Historian(s)  Patient's family at bedside.    External Records Reviewed  Per chart review, patient was recently admitted for hemorrhagic shock 01/29/2021-02/07/2021 where he was found to have  transformation of his CMML to acute leukemia. During his admission, he was noted to have history of frequent bright red bowel movements with a 9 point drop in Hb over 1 month. He has known history of gastric ulcers however he is on a daily PPI. CT A/P 11/26 was negative for bleed as well as prior colonoscopies. Colonoscopy 11/30 did not show acute bleed. He was discharged after 3 uRBC on BID oral PPI.       Past Medical History:   Diagnosis Date   ??? Cancer (CMS-HCC)    ??? Hypertension        Patient Active Problem List   Diagnosis   ??? CMML (chronic myelomonocytic leukemia) (CMS-HCC)   ??? Hemorrhagic shock (CMS-HCC)   ??? Vasovagal syncope   ??? Hypertension   ??? Coronary artery disease without angina pectoris   ??? AKI (acute kidney injury) (CMS-HCC)   ??? Hematochezia   ??? Melena   ??? Hemorrhoid   ??? Anemia   ??? 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 HERNIA REPAIR  1975   ??? PR COLONOSCOPY FLX DX W/COLLJ SPEC WHEN PFRMD N/A 02/02/2021    Procedure: COLONOSCOPY, FLEXIBLE, PROXIMAL TO SPLENIC FLEXURE; DIAGNOSTIC, W/WO COLLECTION SPECIMEN BY BRUSH OR WASH;  Surgeon: Mayford Knife, MD;  Location: [facility];  Service: Gastroenterology   ??? PR GASTROINTESTINAL TRACT IMAGING ESOPHAGUS W/I&R N/A 02/02/2021    Procedure: GI PATENCY TRACT IMAGING, INTRALUMINAL (EG, CAPSULE ENDOSCOPY), ESOPHAGUS W/PHYSICIAN INTERP/REPORT;  Surgeon: Mayford Knife, MD;  Location: [facility];  Service: Gastroenterology   ??? PR UPPER GI ENDOSCOPY,DIAGNOSIS N/A 02/02/2021    Procedure: UGI ENDO, INCLUDE ESOPHAGUS, STOMACH, & DUODENUM &/OR JEJUNUM; DX W/WO COLLECTION SPECIMN, BY BRUSH OR WASH;  Surgeon: Mayford Knife, MD;  Location: [facility];  Service: Gastroenterology   ??? SKIN BIOPSY         No current facility-administered medications for this encounter.    Current Outpatient Medications:   ???  allopurinol (ZYLOPRIM) 300 MG tablet, Take 1 tablet (300 mg total) by mouth daily., Disp: 30 tablet, Rfl: 2  ???  ferrous sulfate 325 (65 FE) MG tablet, Take 1 tablet (325 mg total) by mouth daily., Disp: 30 tablet, Rfl: 3  ???  hydroCHLOROthiazide (HYDRODIURIL) 12.5 MG tablet, Take 12.5 mg by mouth daily., Disp: , Rfl:   ???  melatonin 10 mg cap, Take 10 mg by mouth nightly., Disp: , Rfl:   ???  pantoprazole (PROTONIX) 40 MG tablet, Take 1 tablet (40 mg total) by mouth Two (2) times a day. (Patient taking differently: Take 40 mg by mouth daily.), Disp: 60 tablet, Rfl: 0  ???  prochlorperazine (COMPAZINE) 10 MG tablet, Take 1 tablet (10 mg total) by mouth every six (6) hours as needed for nausea., Disp: 30 tablet, Rfl: 2  ???  valACYclovir (VALTREX) 500 MG tablet, Take 500 mg by mouth daily., Disp: , Rfl:   ???  venetoclax (VENCLEXTA) 100 mg tablet, Take 4 tablets (400 mg total) by mouth daily for 21 days of each cycle., Disp: 84 tablet, Rfl: 5    Allergies  Patient has no known allergies.    Family History   Problem Relation Age of Onset   ??? Alzheimer's disease Father    ??? Melanoma Neg Hx    ??? Basal cell carcinoma Neg Hx    ??? Squamous cell  carcinoma Neg Hx        Social History  Social History     Tobacco Use   ??? Smoking status: Never   ??? Smokeless tobacco: Never   Vaping Use   ??? Vaping Use: Never used   Substance Use Topics   ??? Alcohol use: Not Currently   ??? Drug use: Never          Physical Exam         ED Triage Vitals [03/12/21 0920]   Enc Vitals Group      BP 121/68      Heart Rate 110      SpO2 Pulse       Resp 20      Temp 37.2 ??C (99 ??F)      Temp Source Oral      SpO2 96 %      Weight (!) 137.9 kg (304 lb)     Constitutional: Alert and oriented. In no acute distress.   Eyes: Conjunctivae are normal.  ENT       Head: Normocephalic and atraumatic.       Nose: No congestion.       Mouth/Throat: Mucous membranes are moist.       Neck: No stridor.  Cardiovascular: Tachycardic, regular rhythm. Normal and symmetric distal pulses are present in all extremities.  Respiratory: Normal respiratory effort. Breath sounds are normal without wheeze or crackle..  Gastrointestinal: Soft and nontender. There is no CVA tenderness. Buttock exam without any skin lesions. Deferred rectal exam due to neutropenia. Some dried blood on undergarments.   Musculoskeletal: Normal range of motion in all extremities. No LE edema.  Neurologic: Normal speech and language. No gross focal neurologic deficits are appreciated.  Skin: Skin is warm, dry and intact. No rash noted.  Psychiatric: Mood and affect are normal. Speech and behavior are normal.          Documentation assistance was provided by Winferd Humphrey, Scribe, on March 12, 2021 at 9:54 AM for  Jiles Crocker, MD.    Documentation assistance was provided by the scribe in my presence.  The documentation recorded by the scribe has been reviewed by me and accurately reflects the services I personally performed.             Jonetta Speak, MD  03/14/21 1044

## 2021-03-13 DIAGNOSIS — Z8719 Personal history of other diseases of the digestive system: Secondary | ICD-10-CM | POA: Diagnosis not present

## 2021-03-13 DIAGNOSIS — K625 Hemorrhage of anus and rectum: Secondary | ICD-10-CM | POA: Diagnosis not present

## 2021-03-13 DIAGNOSIS — R710 Precipitous drop in hematocrit: Secondary | ICD-10-CM | POA: Diagnosis not present

## 2021-03-13 LAB — BASIC METABOLIC PANEL
ANION GAP: 6 mmol/L (ref 5–14)
BLOOD UREA NITROGEN: 7 mg/dL — ABNORMAL LOW (ref 9–23)
BUN / CREAT RATIO: 10
CALCIUM: 7.9 mg/dL — ABNORMAL LOW (ref 8.7–10.4)
CHLORIDE: 106 mmol/L (ref 98–107)
CO2: 28 mmol/L (ref 20.0–31.0)
CREATININE: 0.73 mg/dL
EGFR CKD-EPI (2021) MALE: >90 mL/min/{1.73_m2} (ref >=60)
GLUCOSE RANDOM: 122 mg/dL (ref 70–179)
POTASSIUM: 3.3 mmol/L — ABNORMAL LOW (ref 3.4–4.8)
SODIUM: 140 mmol/L (ref 135–145)

## 2021-03-13 LAB — SLIDE REVIEW

## 2021-03-13 LAB — CBC W/ AUTO DIFF
BASOPHILS ABSOLUTE COUNT: 0.1 10*9/L (ref 0.0–0.1)
BASOPHILS RELATIVE PERCENT: 9.1 %
EOSINOPHILS ABSOLUTE COUNT: 0 10*9/L (ref 0.0–0.5)
EOSINOPHILS RELATIVE PERCENT: 1.3 %
HEMATOCRIT: 19.9 % — ABNORMAL LOW (ref 39.0–48.0)
HEMOGLOBIN: 6.4 g/dL — ABNORMAL LOW (ref 12.9–16.5)
LYMPHOCYTES ABSOLUTE COUNT: 0.3 10*9/L — ABNORMAL LOW (ref 1.1–3.6)
LYMPHOCYTES RELATIVE PERCENT: 36.6 %
MEAN CORPUSCULAR HEMOGLOBIN CONC: 31.9 g/dL — ABNORMAL LOW (ref 32.0–36.0)
MEAN CORPUSCULAR HEMOGLOBIN: 24.9 pg — ABNORMAL LOW (ref 25.9–32.4)
MEAN CORPUSCULAR VOLUME: 77.9 fL (ref 77.6–95.7)
MEAN PLATELET VOLUME: 7.1 fL (ref 6.8–10.7)
MONOCYTES ABSOLUTE COUNT: 0.1 10*9/L — ABNORMAL LOW (ref 0.3–0.8)
MONOCYTES RELATIVE PERCENT: 12.9 %
NEUTROPHILS ABSOLUTE COUNT: 0.3 10*9/L — CL (ref 1.8–7.8)
NEUTROPHILS RELATIVE PERCENT: 40.1 %
PLATELET COUNT: 1504 10*9/L (ref 150–450)
RED BLOOD CELL COUNT: 2.55 10*12/L — ABNORMAL LOW (ref 4.26–5.60)
RED CELL DISTRIBUTION WIDTH: 23.1 % — ABNORMAL HIGH (ref 12.2–15.2)
WBC ADJUSTED: 0.9 10*9/L — ABNORMAL LOW (ref 3.6–11.2)

## 2021-03-13 LAB — HEPATIC FUNCTION PANEL
ALBUMIN: 2.4 g/dL — ABNORMAL LOW (ref 3.4–5.0)
ALKALINE PHOSPHATASE: 78 U/L (ref 46–116)
ALT (SGPT): <7 U/L — ABNORMAL LOW (ref 10–49)
AST (SGOT): 11 U/L (ref <=34)
BILIRUBIN DIRECT: 0.2 mg/dL (ref 0.00–0.30)
BILIRUBIN TOTAL: 0.5 mg/dL (ref 0.3–1.2)
PROTEIN TOTAL: 5.1 g/dL — ABNORMAL LOW (ref 5.7–8.2)

## 2021-03-13 LAB — HEMOGLOBIN AND HEMATOCRIT, BLOOD
HEMATOCRIT: 21.7 % — ABNORMAL LOW (ref 39.0–48.0)
HEMATOCRIT: 23.5 % — ABNORMAL LOW (ref 39.0–48.0)
HEMOGLOBIN: 6.9 g/dL — ABNORMAL LOW (ref 12.9–16.5)
HEMOGLOBIN: 7.6 g/dL — ABNORMAL LOW (ref 12.9–16.5)

## 2021-03-13 LAB — FACTOR VIII (VWF PANEL): FACTOR VIII ACTIVITY: 133 % (ref 56–186)

## 2021-03-13 LAB — PHOSPHORUS: PHOSPHORUS: 2.8 mg/dL (ref 2.4–5.1)

## 2021-03-13 LAB — MAGNESIUM: MAGNESIUM: 1.5 mg/dL — ABNORMAL LOW (ref 1.6–2.6)

## 2021-03-13 MED ADMIN — valACYclovir (VALTREX) tablet 500 mg: 500 mg | ORAL | @ 20:00:00

## 2021-03-13 MED ADMIN — ferrous sulfate tablet 325 mg: 325 mg | ORAL | @ 15:00:00

## 2021-03-13 MED ADMIN — potassium chloride oral solution: 40 meq | ORAL | @ 22:00:00 | Stop: 2021-03-13

## 2021-03-13 MED ADMIN — pantoprazole (PROTONIX) injection 40 mg: 40 mg | INTRAVENOUS | @ 12:00:00 | Stop: 2021-03-13

## 2021-03-13 MED ADMIN — sodium chloride (NS) 0.9 % infusion: 125 mL/h | INTRAVENOUS | @ 04:00:00 | Stop: 2021-03-13

## 2021-03-13 MED ADMIN — pantoprazole (PROTONIX) injection 40 mg: 40 mg | INTRAVENOUS | @ 19:00:00 | Stop: 2021-03-13

## 2021-03-13 MED ADMIN — pantoprazole (PROTONIX) EC tablet 40 mg: 40 mg | ORAL | @ 04:00:00

## 2021-03-13 MED ADMIN — prochlorperazine (COMPAZINE) tablet 5 mg: 5 mg | ORAL | @ 20:00:00

## 2021-03-13 MED ADMIN — allopurinol (ZYLOPRIM) tablet 300 mg: 300 mg | ORAL | @ 15:00:00 | Stop: 2021-03-13

## 2021-03-13 MED ADMIN — traZODone (DESYREL) tablet 50 mg: 50 mg | ORAL | @ 06:00:00 | Stop: 2021-03-13

## 2021-03-13 MED ADMIN — sodium chloride (NS) 0.9 % infusion: 20 mL/h | INTRAVENOUS | @ 16:00:00 | Stop: 2021-03-13

## 2021-03-13 NOTE — Unmapped (Signed)
Hematology Resident (MEDE) History & Physical    Assessment & Plan:   Gary Baird is a 81 y.o. 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 *    Recurrent GI bleed; hx hemorrhoids:   Per chart review, patient was recently admitted for hemorrhagic shock 01/29/2021-02/07/2021 where he was found to have transformation of his CMML to acute leukemia. During his admission, he was noted to have history of frequent bright red bowel movements with a 9 point drop in Hb over 1 month. He has known history of gastric ulcers however he is on a daily PPI. CT A/P 11/26 was negative for bleed as well as prior colonoscopies.  Ended up receiving upper endoscopy, colonoscopy and capsule endoscopy only significant for angiectasia's within the small bowel, likely the source of his recurrent bleeding.  Current presentation similar to prior episodes.  Bleeding seems to have slowed down in the past several hours.  Will monitor conservatively for now.  -GI consult if bleeding persists  - Clear liquid diet  - Continue IV PPI 40 mg twice daily for now, less likely gastric bleed so can de-escalate back to oral PPI if stable in several hours  - Maintain type and screen  - Transfuse for hemoglobin less than 7, PLT<10  - Follow up GIPP  - Start Maintenance IV fluids given presyncopal episode earlier in the night    Thrombocytosis:   Pt with extreme/acute thrombocytosis, PLT>2000, previously 100s in December. Potentially reactive thrombocytosis given iron deficiency anemia/blood loss. Can also be due to infection - Viral, bacterial, but less likely given lack of clear infectious source/afebrile. Significant thrombocytosis can be associated with increased risk of bleeding due to acquired von Willebrand Syndrome (above).  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 On monthly azacitidine since 09/2018 for CMML, stopped 12/30/20. Patient developed GI bleeding and worsening leukocytosis and was admitted to [facility] for management on 01/29/21. BMBx identified progression to AML. Started Aza/Ven 02/14/2021. S/p C1 Aza/Ven with BMBx revealing morphologic remission. Recent bone marrow biopsy results, showed <1% blasts, revealing CR. Due for cycle 2 Aza/Ven on 1/9. Per 1/5 note from Dr. Vertell Limber, if [facility] >=1,000, patient will require a 1-week delay for his next cycle of treatment. Plan is to coordinate future treatment cycles to be done locally with [name] at [facility].  - Continue Valtrex 500 mg daily  - Continue allopurinol 300 mg daily  - Compazine 10 mg daily q6h PRN    Iron deficiency anemia:  Normocytic anemia:  Iron studies revealed iron deficiency anemia. He was started on ferrous sulfate last visit.   Started on Ferrous Sulfate supplementation at last clinic visit  - Held ferrous sulfate 325 mg daily  GERD:  - pantoprazole 40 mg BID  Hypertension:  - Hold home hydrochlorothiazide 12.5 mg daily  Hypokalemia: Replete PRN  Other/chronic:  - Melatonin 3 mg nightly PRN    Daily Checklist:  Diet: Regular Diet  DVT PPx: Contraindicated - High Risk for Bleeding/Active Bleeding  Electrolytes: No Repletion Needed  Code Status: Full Code    Chief Concern:   GI bleed    Subjective:   HPI:  Gary Baird is a 81 y.o. 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     Patient reports that he has had intermittent bloody stools for the past several years with several colonoscopies and endoscopies which were noted to be negative for any source other than hemorrhoids. He was recently admitted 11/26-12/5 and underwent colonoscopy which did not show evidence of an acute bleed after which he was discharged on BID oral PPI. He states that since this admission, he has not had any episodes of bloody stools until this morning, nor has he missed any doses of his PPI.     This morning, patient woke up and had  two episodes of dark red bloody stools on the morning of 1/7. He additionally endorses several episodes of diarrhea since last night attributed to taking Dulcolax. Denies pain with bowel movements.  He says the stools were maroon-colored and mostly diarrhea.    Of note, patient presented for a BMBx 03/08/21, and was noted to have new thrombocytosis with platelets >2,000 from 100 (12/29). Initially planned for admission; deferred/managed as outpatient. Iron panel/ferritin consistent with iron deficiency anemia. vWF panel vWF antigen 168, VWF activity 119.     He denies fever, chills, abdominal pain, nausea, vomiting, chest pain, or shortness of breath.     In ED: While in the ED, patient with 1 bloody bowel movement and became lightheaded shortly after this.  He denies seeing clots.  Vital Signs: T 37.2C, HR 110, RR 20, BP 121/68, SpO2 96% on room air.  Labs: CBC/D: WBC 1.1, hemoglobin 9.6, 7.8 on repeat. platelets 2315.  ANC 0.6.  CMP: Sodium 138, potassium 3.4, creatinine 1.74 LFTs within normal range.  AST 11, T bili 0.5.  Flu COVID-negative.  Interventions: Patient was started on IV Protonix 40 mg.  GI pathogen panel pending. Received 1 unit PRBC.  Primary oncologist: Dr. Vertell Limber    Designated Healthcare Decision Maker:  [name] currently has decisional capacity for healthcare decision-making and is able to designate a surrogate healthcare decision maker. [name]'s designated healthcare decision maker(s) is/are Gary Baird (the patient's adult child) as denoted by stated patient preference.    Allergies:  Patient has no known allergies.    Medications:   Prior to Admission medications    Medication Dose, Route, Frequency   allopurinol (ZYLOPRIM) 300 MG tablet 300 mg, Oral, Daily (standard)   ferrous sulfate 325 (65 FE) MG tablet 325 mg, Oral, Daily (standard)   hydroCHLOROthiazide (HYDRODIURIL) 12.5 MG tablet 12.5 mg, Oral, Daily (standard)   melatonin 10 mg cap 10 mg, Oral, Nightly   pantoprazole (PROTONIX) 40 MG tablet 40 mg, Oral, 2 times a day (standard)  Patient taking differently: Take 40 mg by mouth daily.   prochlorperazine (COMPAZINE) 10 MG tablet 10 mg, Oral, Every 6 hours PRN   valACYclovir (VALTREX) 500 MG tablet 500 mg, Oral, Daily (standard)   venetoclax (VENCLEXTA) 100 mg tablet Take 4 tablets (400 mg total) by mouth daily for 21 days of each cycle.       Medical History:  Past Medical History:   Diagnosis Date   ??? Cancer (CMS-HCC)    ??? Hypertension  Surgical History:  Past Surgical History:   Procedure Laterality Date   ??? HERNIA REPAIR  1975   ??? PR COLONOSCOPY FLX DX W/COLLJ SPEC WHEN PFRMD N/A 02/02/2021    Procedure: COLONOSCOPY, FLEXIBLE, PROXIMAL TO SPLENIC FLEXURE; DIAGNOSTIC, W/WO COLLECTION SPECIMEN BY BRUSH OR WASH;  Surgeon: Mayford Knife, MD;  [facility];  Service: Gastroenterology   ??? PR GASTROINTESTINAL TRACT IMAGING ESOPHAGUS W/I&R N/A 02/02/2021    Procedure: GI PATENCY TRACT IMAGING, INTRALUMINAL (EG, CAPSULE ENDOSCOPY), ESOPHAGUS W/PHYSICIAN INTERP/REPORT;  Surgeon: Mayford Knife, MD;  [facility]: [facility];  Service: Gastroenterology   ??? PR UPPER GI ENDOSCOPY,DIAGNOSIS N/A 02/02/2021    Procedure: UGI ENDO, INCLUDE ESOPHAGUS, STOMACH, & DUODENUM &/OR JEJUNUM; DX W/WO COLLECTION SPECIMN, BY BRUSH OR WASH;  Surgeon: Mayford Knife, MD;  Location: [facility];  Service: Gastroenterology   ??? SKIN BIOPSY         Family History:   Family History   Problem Relation Age of Onset   ??? Alzheimer's disease Father    ??? Melanoma Neg Hx    ??? Basal cell carcinoma Neg Hx    ??? Squamous cell carcinoma Neg Hx        Social History:  The patient lives with family    Social History     Tobacco Use   ??? Smoking status: Never   ??? Smokeless tobacco: Never   Vaping Use   ??? Vaping Use: Never used   Substance Use Topics   ??? Alcohol use: Not Currently   ??? Drug use: Never        Review of Systems:  10 systems were reviewed and are negative unless otherwise mentioned in the HPI    Objective:   Physical Exam:  Temp:  [36.3 ??C (97.3 ??F)-37.2 ??C (99 ??F)] 36.3 ??C (97.3 ??F)  Heart Rate:  [102-118] 102  SpO2 Pulse:  [121] 121  Resp:  [18-20] 18  BP: (110-121)/(59-69) 110/59  SpO2:  [96 %-100 %] 100 %    Gen: Well-appearing in no acute distress.  Alert and oriented x3.  Eyes: Sclera anicteric, EOMI, PERRLA,  HENT: atraumatic, normocephalic, MMM. OP w/o erythema or exudate   Neck: no cervical lymphadenopathy or thyromegaly, no JVD  Heart: RRR, S1, S2, no M/R/G, no chest wall tenderness  Lungs: CTAB, no crackles or wheezes, no use of accessory muscles  Abdomen: Normoactive bowel sounds, soft, NTND, no rebound/guarding, no hepatosplenomegaly  Extremities: no clubbing, cyanosis, or edema: pulses are +2 in bilateral upper and lower extremities  Neuro: CN II-XI grossly intact, normal cerebellar function, normal gait. No focal deficits.  Skin:  No rashes, lesions on clothed exam  Psych: Alert, oriented, normal mood and affect.     Labs/Studies/Imaging:  Labs, Studies, Imaging from the last 24hrs per EMR and personally reviewed

## 2021-03-13 NOTE — Unmapped (Signed)
Report called to [facility] & [facility], all questions answered. [facility] ETA after shift change, patient and wife aware

## 2021-03-13 NOTE — Unmapped (Signed)
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 *      Recurrent GI bleed; hx hemorrhoids:   Per chart review, patient was recently admitted for hemorrhagic shock 01/29/2021-02/07/2021 where he was found to have transformation of his CMML to acute leukemia. During his admission, he was noted to have history of frequent bright red bowel movements with a 9 point drop in Hb over 1 month. He has known history of gastric ulcers however he is on a daily PPI. CT A/P 11/26 was negative for bleed as well as prior colonoscopies.  Ended up receiving upper endoscopy, colonoscopy and capsule endoscopy only significant for angiectasia's within the small bowel, likely the source of his recurrent bleeding.  Current presentation similar to prior episodes.  Bleeding seems to have slowed down in the past several hours.  Will monitor conservatively for now.  -GI consult today, appreciate recommendations  -in discussion with gastroenterology patient will not be colonoscopy today or tomorrow, regular diet  - Continue IV PPI 40 mg twice daily for now, less likely gastric bleed so can de-escalate back to oral PPI if stable tomorrow  - Maintain type and screen  - Transfuse for hemoglobin less than 7, PLT<10, received 1 unit of blood 03/13/2021  - Follow up GIPP  ??  Thrombocytosis:   Pt with extreme/acute thrombocytosis, PLT>2000, previously 100s in December. Potentially reactive thrombocytosis given iron deficiency anemia/blood loss. Can also be due to infection - Viral, bacterial, but less likely given lack of clear infectious source/afebrile.  Other etiologies also include reaction to chemotherapy affecting his myeloid progenitor cells, however this would also be extraordinarily rare and not usually associated with his chemotherapy.  Significant thrombocytosis can be associated with increased risk of bleeding due to acquired von Willebrand Syndrome (above).  - Daily CBC/Diff  - Peripheral blood smear  - Follow-up repeat Von Willebrand panel  - Hold ASA while testing for AVWS  ??  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 On monthly azacitidine since 09/2018 for CMML, stopped 12/30/20. Patient developed GI bleeding and worsening leukocytosis and was admitted to [facility] for management on 01/29/21. BMBx identified??progression to??AML. Started Aza/Ven 02/14/2021. S/p C1 Aza/Ven??with BMBx revealing morphologic remission. Recent bone marrow biopsy results, showed <1% blasts, revealing CR. Due for cycle 2 Aza/Ven on 1/9. Per 1/5 note from Dr. Vertell Limber, if [facility] >=1,000, patient will require a 1-week delay for his next cycle of treatment. Plan is to coordinate future treatment cycles to be done locally??with [name] at [facility].  - Continue Valtrex 500 mg daily  - Compazine 5 mg daily q6h PRN  ??  Chronic Problems:  Iron deficiency anemia:  Normocytic anemia:  Iron studies revealed iron deficiency anemia. He was started on ferrous sulfate last visit.   Started on Ferrous Sulfate supplementation at last clinic visit  - Held ferrous sulfate 325 mg daily secondary to receiving blood products  GERD:  - pantoprazole 40 mg BID  Hypertension:  - Hold home hydrochlorothiazide  12.5 mg daily given presyncopal episode, consider restarting tomorrow  Hypokalemia: Replete PRN  Other/chronic:  - Melatonin 3 mg nightly PRN        Daily Checklist:  Diet: Regular Diet  DVT PPx: Contraindicated - High Risk for Bleeding/Active Bleeding  Electrolytes: Potassium Repleted  Code Status: Full Code    Team Contact Information:   Primary Team: Hematology Resident (MEDE)  Primary Resident: Nelly Laurence, MD  Resident's Pager: [phone] (Hematology Intern - Blue)    Interval History:   No acute events.  Patient's hemoglobin less than 7 and received 1 unit today.  Otherwise no complaints.  Has had no additional bloody stools since yesterday.    ROS: Denies headache, chest pain, shortness of breath, abdominal pain    Objective:     Temp:  [35.7 ??C (96.3 ??F)-37.2 ??C (98.9 ??F)] 36.7 ??C (98.1 ??F)  Heart Rate:  [96-118] 97  SpO2 Pulse:  [121] 121  Resp:  [18-20] 19  BP: (105-120)/(53-69) 108/53  SpO2:  [97 %-100 %] 98 %,   Intake/Output Summary (Last 24 hours) at 03/13/2021 1432  Last data filed at 03/13/2021 1357  Gross per 24 hour   Intake 447.08 ml   Output --   Net 447.08 ml       Gen: Well-appearing in no acute distress.  Alert and oriented x3.  Eyes: Sclera anicteric, EOMI, PERRLA,  HENT: atraumatic, normocephalic, MMM. OP w/o erythema or exudate   Neck: no cervical lymphadenopathy or thyromegaly, no JVD  Heart: RRR, S1, S2, no M/R/G, no chest wall tenderness  Lungs: CTAB, no crackles or wheezes, no use of accessory muscles  Abdomen: Normoactive bowel sounds, soft, NTND, no rebound/guarding, no hepatosplenomegaly  Extremities: no clubbing, cyanosis, or edema: pulses are +2 in bilateral upper and lower extremities  Neuro: CN II-XI grossly intact, normal cerebellar function, normal gait. No focal deficits.  Skin:  No rashes, lesions on clothed exam  Psych: Alert, oriented, normal mood and affect.     Labs/Studies: Labs and Studies from the last 24hrs per EMR and Reviewed

## 2021-03-13 NOTE — Unmapped (Signed)
CAC at bedside, all questions answered

## 2021-03-14 LAB — SLIDE REVIEW

## 2021-03-14 LAB — CBC W/ AUTO DIFF
BASOPHILS ABSOLUTE COUNT: 0.1 10*9/L (ref 0.0–0.1)
BASOPHILS RELATIVE PERCENT: 8.2 %
EOSINOPHILS ABSOLUTE COUNT: 0 10*9/L (ref 0.0–0.5)
EOSINOPHILS RELATIVE PERCENT: 2.1 %
HEMATOCRIT: 23.2 % — ABNORMAL LOW (ref 39.0–48.0)
HEMOGLOBIN: 7.9 g/dL — ABNORMAL LOW (ref 12.9–16.5)
LYMPHOCYTES ABSOLUTE COUNT: 0.5 10*9/L — ABNORMAL LOW (ref 1.1–3.6)
LYMPHOCYTES RELATIVE PERCENT: 34.6 %
MEAN CORPUSCULAR HEMOGLOBIN CONC: 33.8 g/dL (ref 32.0–36.0)
MEAN CORPUSCULAR HEMOGLOBIN: 26.8 pg (ref 25.9–32.4)
MEAN CORPUSCULAR VOLUME: 79.2 fL (ref 77.6–95.7)
MEAN PLATELET VOLUME: 7.1 fL (ref 6.8–10.7)
MONOCYTES ABSOLUTE COUNT: 0.2 10*9/L — ABNORMAL LOW (ref 0.3–0.8)
MONOCYTES RELATIVE PERCENT: 16.5 %
NEUTROPHILS ABSOLUTE COUNT: 0.5 10*9/L — ABNORMAL LOW (ref 1.8–7.8)
NEUTROPHILS RELATIVE PERCENT: 38.6 %
NUCLEATED RED BLOOD CELLS: 4 /100{WBCs} (ref <=4)
PLATELET COUNT: 1434 10*9/L (ref 150–450)
RED BLOOD CELL COUNT: 2.93 10*12/L — ABNORMAL LOW (ref 4.26–5.60)
RED CELL DISTRIBUTION WIDTH: 21.4 % — ABNORMAL HIGH (ref 12.2–15.2)
WBC ADJUSTED: 1.4 10*9/L — ABNORMAL LOW (ref 3.6–11.2)

## 2021-03-14 LAB — BASIC METABOLIC PANEL
ANION GAP: 6 mmol/L (ref 5–14)
BLOOD UREA NITROGEN: 7 mg/dL — ABNORMAL LOW (ref 9–23)
BUN / CREAT RATIO: 10
CALCIUM: 8.3 mg/dL — ABNORMAL LOW (ref 8.7–10.4)
CHLORIDE: 107 mmol/L (ref 98–107)
CO2: 27 mmol/L (ref 20.0–31.0)
CREATININE: 0.69 mg/dL
EGFR CKD-EPI (2021) MALE: >90 mL/min/{1.73_m2} (ref >=60)
GLUCOSE RANDOM: 114 mg/dL (ref 70–179)
POTASSIUM: 3 mmol/L — ABNORMAL LOW (ref 3.4–4.8)
SODIUM: 140 mmol/L (ref 135–145)

## 2021-03-14 LAB — VON WILLEBRAND FACTOR PANEL
VON WILLEBRAND AG: 131 %{normal} (ref 47–157)
VON WILLEBRAND FACTOR ACTIVITY: 130 %{normal} (ref 50–168)

## 2021-03-14 LAB — MAGNESIUM: MAGNESIUM: 1.7 mg/dL (ref 1.6–2.6)

## 2021-03-14 LAB — PHOSPHORUS: PHOSPHORUS: 1.7 mg/dL — ABNORMAL LOW (ref 2.4–5.1)

## 2021-03-14 MED ADMIN — melatonin tablet 3 mg: 3 mg | ORAL | @ 05:00:00

## 2021-03-14 MED ADMIN — pantoprazole (PROTONIX) EC tablet 40 mg: 40 mg | ORAL | @ 14:00:00

## 2021-03-14 MED ADMIN — peg-electrolyte soln (GoLYTELY) solution 4,000 mL: 4000 mL | ORAL | @ 22:00:00 | Stop: 2021-03-14

## 2021-03-14 MED ADMIN — valACYclovir (VALTREX) tablet 500 mg: 500 mg | ORAL | @ 14:00:00

## 2021-03-14 MED ADMIN — ferrous sulfate tablet 325 mg: 325 mg | ORAL | @ 14:00:00

## 2021-03-14 NOTE — Unmapped (Signed)
Gastroenterology (Luminal) Consult Service   Initial Consultation         Assessment and Recommendations:   Gary Baird is a 81 y.o. 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    Patient is presenting again with lower GI bleeding. He has had multiple EGDs and colonoscopies which have not identified a source of bleeding. During his last admission he had a small bowel evaluation with VCE which also did not identify bleeding but was considered an incomplete study as the capsule did not reach the colon. Given the uncertainty regarding his current oncology prognosis and his overall stability, we do not recommend endoscopy at this time. If any study were to be performed, we feel that a repeat video capsule endoscopy may be the highest yield.     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 At this time we do not recommend endoscopy  - Please maintain two large-bore (16-18G) peripheral IVs at all times or central access  - Keep type & screen active  - Transfuse for Hgb <7  - Continue PO PPI  - Stop any NSAIDs, aspirin, antiplatelets, and anticoagulants      Thank you for involving [place] in the care of your patient. We will continue to follow along with you. This patient was seen and examined with Dr. Jessee Avers.     For questions, please contact the on-call fellow for the Gastroenterology (Luminal) Consult Service at [phone].       I saw and evaluated the patient, participating in the key portions of the service.  I reviewed the resident???s note.  I agree with the resident???s findings and plan. Epifania Gore, MD        Reason for Consultation:   Pt was seen in consultation at the request of Wynn Banker, MD (Oncology/Hematology (MDE)) for gastrointestinal bleeding.    Subjective:   HPI:  Gary Baird is a 81 y.o. 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    He was recently admitted 11/26-12/5 and underwent EGD, colonoscopy and capsule endoscopy which did not show evidence of an active GI bleed after which he was discharged on BID oral PPI. He states that since this admission, he has not had any episodes of bloody stools until this morning, nor has he missed any doses of his PPI.     He now presents with BRBPR starting on the morning of 1/7. He reports >5 episodes of bloody diarrhea. The character of the bleeding is similar to his prior episodes. He felt lightheaded. He denies nausea, vomiting or abdominal pain. He presented to the ED for further evaluation.    Found to have a Hgb 9.6, which nadired at 6.4 overnight. He is now responding to transfusion. He has remained hemodynamically stable. Other workup showed a significantly elevated Plt count (>2000). Interestingly a recent BMBx was negative for blasts.      Allergies:  Patient has no known allergies.    Medications:   Prior to Admission medications    Medication Dose, Route, Frequency   pantoprazole (PROTONIX) 40 MG tablet 40 mg, Oral, 2 times a day (standard)  Patient taking differently: Take 40 mg by mouth daily.   allopurinol (ZYLOPRIM) 300 MG tablet 300  mg, Oral, Daily (standard)   ferrous sulfate 325 (65 FE) MG tablet 325 mg, Oral, Daily (standard)   hydroCHLOROthiazide (HYDRODIURIL) 12.5 MG tablet 12.5 mg, Oral, Daily (standard)   melatonin 10 mg cap 10 mg, Oral, Nightly   prochlorperazine (COMPAZINE) 10 MG tablet 10 mg, Oral, Every 6 hours PRN   valACYclovir (VALTREX) 500 MG tablet 500 mg, Oral, Daily (standard)   venetoclax (VENCLEXTA) 100 mg tablet Take 4 tablets (400 mg total) by mouth daily for 21 days of each cycle.     Medical History:  Past Medical History:   Diagnosis Date    Cancer (CMS-HCC)     Hypertension      Surgical History:  Past Surgical History:   Procedure Laterality Date    HERNIA REPAIR 1975    PR COLONOSCOPY FLX DX W/COLLJ SPEC WHEN PFRMD N/A 02/02/2021    Procedure: COLONOSCOPY, FLEXIBLE, PROXIMAL TO SPLENIC FLEXURE; DIAGNOSTIC, W/WO COLLECTION SPECIMEN BY BRUSH OR WASH;  Surgeon: Mayford Knife, MD;  [facility];  Service: Gastroenterology    PR GASTROINTESTINAL TRACT IMAGING ESOPHAGUS W/I&R N/A 02/02/2021    Procedure: GI PATENCY TRACT IMAGING, INTRALUMINAL (EG, CAPSULE ENDOSCOPY), ESOPHAGUS W/PHYSICIAN INTERP/REPORT;  Surgeon: Mayford Knife, MD;  [facility]: [facility];  Service: Gastroenterology    PR UPPER GI ENDOSCOPY,DIAGNOSIS N/A 02/02/2021    Procedure: UGI ENDO, INCLUDE ESOPHAGUS, STOMACH, & DUODENUM &/OR JEJUNUM; DX W/WO COLLECTION SPECIMN, BY BRUSH OR WASH;  Surgeon: Mayford Knife, MD;  [facility];  Service: Gastroenterology    SKIN BIOPSY       Social History:  Social History     Tobacco Use    Smoking status: Never    Smokeless tobacco: Never   Vaping Use    Vaping Use: Never used   Substance Use Topics    Alcohol use: Not Currently    Drug use: Never     Family History:  Family History   Problem Relation Age of Onset    Alzheimer's disease Father     Melanoma Neg Hx     Basal cell carcinoma Neg Hx     Squamous cell carcinoma Neg Hx      Review of Systems:  10 systems were reviewed and are negative unless otherwise mentioned in the HPI    Objective:   Physical Exam:  Temp:  [35.7 ??C (96.3 ??F)-37.2 ??C (99 ??F)] 37.2 ??C (99 ??F)  Heart Rate:  [95-106] 95  Resp:  [18-19] 18  BP: (105-127)/(53-60) 115/54  SpO2:  [97 %-100 %] 97 %    Gen: NAD, well appearing  Eyes: sclera anicteric, EOMI  HENT: atraumatic, MMM  Heart: non-tachycardic  Lungs: no respiratory distress  Abdomen: Normoactive bowel sounds, soft, NT/ND, no rebound/guarding  Extremities: no clubbing, cyanosis, or edema in the BLEs  Neuro: Alert, appropriate mood and affect. No focal findings.    Labs/Studies:  Labs, studies, and imaging from the last 24hrs per EMR and personally reviewed.

## 2021-03-14 NOTE — Unmapped (Signed)
Pt stable; no further evidence of GI bleeding, bloody stools. 2 units of PRBCs given. Will continue to monitor Hgb and s/s of bleeding.   Problem: Adult Inpatient Plan of Care  Goal: Plan of Care Review  Outcome: Ongoing - Unchanged  Goal: Patient-Specific Goal (Individualized)  Outcome: Ongoing - Unchanged  Goal: Absence of Hospital-Acquired Illness or Injury  Outcome: Ongoing - Unchanged  Intervention: Identify and Manage Fall Risk  Recent Flowsheet Documentation  Taken 03/13/2021 1635 by Annell Greening, RN  Safety Interventions: fall reduction program maintained  Taken 03/13/2021 1205 by Annell Greening, RN  Safety Interventions:   fall reduction program maintained   lighting adjusted for tasks/safety   low bed   nonskid shoes/slippers when out of bed  Taken 03/13/2021 1130 by Annell Greening, RN  Safety Interventions:   fall reduction program maintained   family at bedside   low bed   lighting adjusted for tasks/safety  Taken 03/13/2021 1000 by Annell Greening, RN  Safety Interventions:   fall reduction program maintained   lighting adjusted for tasks/safety   low bed   nonskid shoes/slippers when out of bed  Taken 03/13/2021 0720 by Annell Greening, RN  Safety Interventions:   fall reduction program maintained   low bed   lighting adjusted for tasks/safety   nonskid shoes/slippers when out of bed  Intervention: Prevent and Manage VTE (Venous Thromboembolism) Risk  Recent Flowsheet Documentation  Taken 03/13/2021 1635 by Annell Greening, RN  Activity Management: activity adjusted per tolerance  Goal: Optimal Comfort and Wellbeing  Outcome: Ongoing - Unchanged  Goal: Readiness for Transition of Care  Outcome: Ongoing - Unchanged  Goal: Rounds/Family Conference  Outcome: Ongoing - Unchanged     Problem: Infection  Goal: Absence of Infection Signs and Symptoms  Outcome: Ongoing - Unchanged

## 2021-03-14 NOTE — Unmapped (Signed)
Patient is here for GI Bleed/ hemorraghic shock, patient received two units of blood previous day and was doing well hgb came back within parameters. Patient had a formed stool and bloody stool yesterday. Hat placed in the bathroom to monitor for other blood present. Patient doing okay. Will continue to monitor.   Problem: Adult Inpatient Plan of Care  Goal: Plan of Care Review  Outcome: Ongoing - Unchanged  Goal: Patient-Specific Goal (Individualized)  Outcome: Ongoing - Unchanged  Goal: Absence of Hospital-Acquired Illness or Injury  Outcome: Ongoing - Unchanged  Intervention: Identify and Manage Fall Risk  Recent Flowsheet Documentation  Taken 03/13/2021 1944 by Phineas Douglas, RN  Safety Interventions:   fall reduction program maintained   lighting adjusted for tasks/safety   low bed   nonskid shoes/slippers when out of bed  Intervention: Prevent Skin Injury  Recent Flowsheet Documentation  Taken 03/14/2021 0400 by Phineas Douglas, RN  Skin Protection:   adhesive use limited   cleansing with dimethicone incontinence wipes  Taken 03/13/2021 1944 by Phineas Douglas, RN  Skin Protection:   adhesive use limited   cleansing with dimethicone incontinence wipes  Intervention: Prevent and Manage VTE (Venous Thromboembolism) Risk  Recent Flowsheet Documentation  Taken 03/13/2021 1944 by Phineas Douglas, RN  Activity Management: activity adjusted per tolerance  Goal: Optimal Comfort and Wellbeing  Outcome: Ongoing - Unchanged  Goal: Readiness for Transition of Care  Outcome: Ongoing - Unchanged  Goal: Rounds/Family Conference  Outcome: Ongoing - Unchanged     Problem: Infection  Goal: Absence of Infection Signs and Symptoms  Outcome: Ongoing - Unchanged

## 2021-03-14 NOTE — Unmapped (Signed)
Gastroenterology (Luminal) Consult Service   Progress Note         Assessment & Plan:   Gary Baird is a 81 y.o. 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  ??  Patient is presenting again with lower GI bleeding. He has had multiple EGDs and colonoscopies which have not identified a source of bleeding. During his last admission he had a small bowel evaluation with VCE which also did not identify bleeding but was considered an incomplete study as the capsule did not reach the colon. At this time, we feel that a repeat video capsule endoscopy may be the highest yield. Will plan for patient to swallow video capsule tomorrow, 1/10. Patient should prep with [place] 2L starting this afternoon with clear liquid diet starting now. NPO at MN.   ??  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

## 2021-03-15 LAB — BASIC METABOLIC PANEL
ANION GAP: 10 mmol/L (ref 5–14)
BLOOD UREA NITROGEN: 6 mg/dL — ABNORMAL LOW (ref 9–23)
BUN / CREAT RATIO: 8
CALCIUM: 8.4 mg/dL — ABNORMAL LOW (ref 8.7–10.4)
CHLORIDE: 103 mmol/L (ref 98–107)
CO2: 27 mmol/L (ref 20.0–31.0)
CREATININE: 0.71 mg/dL
EGFR CKD-EPI (2021) MALE: >90 mL/min/{1.73_m2} (ref >=60)
GLUCOSE RANDOM: 101 mg/dL (ref 70–179)
POTASSIUM: 3 mmol/L — ABNORMAL LOW (ref 3.4–4.8)
SODIUM: 140 mmol/L (ref 135–145)

## 2021-03-15 LAB — CBC W/ AUTO DIFF
BASOPHILS ABSOLUTE COUNT: 0.2 10*9/L — ABNORMAL HIGH (ref 0.0–0.1)
BASOPHILS RELATIVE PERCENT: 8.5 %
EOSINOPHILS ABSOLUTE COUNT: 0 10*9/L (ref 0.0–0.5)
EOSINOPHILS RELATIVE PERCENT: 1.8 %
HEMATOCRIT: 22.9 % — ABNORMAL LOW (ref 39.0–48.0)
HEMOGLOBIN: 7.7 g/dL — ABNORMAL LOW (ref 12.9–16.5)
LYMPHOCYTES ABSOLUTE COUNT: 0.7 10*9/L — ABNORMAL LOW (ref 1.1–3.6)
LYMPHOCYTES RELATIVE PERCENT: 33.1 %
MEAN CORPUSCULAR HEMOGLOBIN CONC: 33.6 g/dL (ref 32.0–36.0)
MEAN CORPUSCULAR HEMOGLOBIN: 26.6 pg (ref 25.9–32.4)
MEAN CORPUSCULAR VOLUME: 79.2 fL (ref 77.6–95.7)
MONOCYTES ABSOLUTE COUNT: 0.4 10*9/L (ref 0.3–0.8)
MONOCYTES RELATIVE PERCENT: 20.2 %
NEUTROPHILS ABSOLUTE COUNT: 0.7 10*9/L — ABNORMAL LOW (ref 1.8–7.8)
NEUTROPHILS RELATIVE PERCENT: 36.4 %
PLATELET COUNT: >1196 10*9/L (ref 150–450)
RED BLOOD CELL COUNT: 2.89 10*12/L — ABNORMAL LOW (ref 4.26–5.60)
RED CELL DISTRIBUTION WIDTH: 22.1 % — ABNORMAL HIGH (ref 12.2–15.2)
WBC ADJUSTED: 2 10*9/L — ABNORMAL LOW (ref 3.6–11.2)

## 2021-03-15 LAB — MAGNESIUM: MAGNESIUM: 1.6 mg/dL (ref 1.6–2.6)

## 2021-03-15 LAB — SLIDE REVIEW

## 2021-03-15 LAB — PHOSPHORUS: PHOSPHORUS: 2.3 mg/dL — ABNORMAL LOW (ref 2.4–5.1)

## 2021-03-15 MED ADMIN — pantoprazole (PROTONIX) EC tablet 40 mg: 40 mg | ORAL | @ 01:00:00

## 2021-03-15 MED ADMIN — potassium chloride CR tablet 40 mEq: 40 meq | ORAL | @ 18:00:00 | Stop: 2021-03-15

## 2021-03-15 MED ADMIN — valACYclovir (VALTREX) tablet 500 mg: 500 mg | ORAL | @ 18:00:00

## 2021-03-15 MED ADMIN — pantoprazole (PROTONIX) EC tablet 40 mg: 40 mg | ORAL | @ 18:00:00

## 2021-03-15 MED ADMIN — magnesium oxide (MAG-OX) tablet 400 mg: 400 mg | ORAL | @ 18:00:00

## 2021-03-15 NOTE — Unmapped (Signed)
Gastroenterology (Luminal) Consult Service   Treatment Plan         Assessment & Plan:     Video capsule swallowed by patient 1/10 at 9am    After Capsule Endoscopy:  -- Video capsule dietary guidelines after capsule placement/swallowing capsule: 0-2hrs NPO, 2-4hrs clear liquids & meds, 4-6hrs light snacks, >6hrs regular diet  -- Please note, patient MUST WEAR BELT in order for receiver to transmit images from capsule; cannot be placed on edge of bed/next to patient or else images may not be transmitted resulting in incomplete study. The belt should only be removed by a member of the GI staff. GI fellow will pick up in the morning. We ask that primary teams and nursing please leave the belt on the patient.   -- GI fellow will pick up belt in the morning/when study complete, do not need to page on call fellow.  -- Please make sure patient does not get a MRI before the capsule has passed (if indicated, please get abdominal x-rays and contact GI fellow)    Thank you for involving [place] in the care of your patient. We will continue to follow along with you.     For questions, please contact the on-call fellow for the Gastroenterology (Luminal) Consult Service at [phone].

## 2021-03-15 NOTE — Unmapped (Signed)
PHYSICAL THERAPY  Evaluation (03/15/21 1400)          Patient Name:?? Gary Baird????????   Medical Record Number: [id number]   Date of Birth: 1940/10/15  Sex: Male??  ??    Treatment Diagnosis: decreased endurance     Activity Tolerance: Tolerated treatment well     ASSESSMENT  Problem List: Decreased strength, Fall risk, Gait deviation, Decreased endurance      Assessment : Gary Baird is a 81 y.o. 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 Pt presents to acute PT services with decreased endurance and his baseline increased postural sway and proximal BLE weakness. He was able to ambulate a short distance in the room mod IND with no LOB. He will benefit from ongoing acute PT services to prevent deconditioning however has no anticipated post acute PT needs at this time. After a review of the personal factors, comorbidities, clinical presentation, and examination of the number of affected body systems, the patient presents as a low complexity case.      Today's Interventions: PT Eval, pt education re: PT role, POC, importance of mobility, DME options if needed              PLAN  Planned Frequency of Treatment:?? 1-2x per day for: 2-3x week       Planned Interventions: Education - Patient, Endurance activities, Functional mobility, Gait training, Self-care / Home training, Therapeutic exercise, Therapeutic activity, Transfer training     Post-Discharge Physical Therapy Recommendations:?? PT services not indicated     PT DME Recommendations: None (owns RW)??????????       Goals:   Patient and Family Goals: to ride his lawn mower     Long Term Goal #1: Pt will ambulate 1000' on level and unlevel surfaces in 8 weeks.        SHORT GOAL #1: Pt will ambulate 300' IND  ?????????????????????? Time Frame : 1 week     ??????????????????????       ??????????????????????       ??????????????????????       ??????????????????????       Prognosis:?? Good  Positive Indicators: PLOF, CLOF, caregiver support  Barriers to Discharge: None     SUBJECTIVE  Patient reports: Pt agreeable to PT, has been walking around the past few days  Current Functional Status: session began and ended with pt sitting up in recliner, needs in reach     Prior Functional Status: Pt is IND at baseline, intermittently uses a SPC for long distances. Had a fall in the ED due to syncopal episode but does not recall. Was able to get on his riding lawn mower last Monday  Equipment available at home: [employer], [place], Other (tub bench)      Past Medical History:   Diagnosis Date   ??? Cancer (CMS-HCC)    ??? Hypertension             Social History     Tobacco Use   ??? Smoking status: Never   ??? Smokeless tobacco: Never   Substance Use Topics   ??? Alcohol use: Not Currently       Past Surgical History:   Procedure Laterality Date   ??? HERNIA REPAIR  1975   ??? PR COLONOSCOPY FLX DX W/COLLJ SPEC WHEN PFRMD N/A 02/02/2021    Procedure: COLONOSCOPY, FLEXIBLE, PROXIMAL TO SPLENIC FLEXURE; DIAGNOSTIC, W/WO COLLECTION SPECIMEN BY  BRUSH OR WASH;  Surgeon: Mayford Knife, MD;  [facility];  Service: Gastroenterology   ??? PR GASTROINTESTINAL TRACT IMAGING ESOPHAGUS W/I&R N/A 02/02/2021    Procedure: GI PATENCY TRACT IMAGING, INTRALUMINAL (EG, CAPSULE ENDOSCOPY), ESOPHAGUS W/PHYSICIAN INTERP/REPORT;  Surgeon: Mayford Knife, MD;  Location: [facility];  Service: Gastroenterology   ??? PR UPPER GI ENDOSCOPY,DIAGNOSIS N/A 02/02/2021    Procedure: UGI ENDO, INCLUDE ESOPHAGUS, STOMACH, & DUODENUM &/OR JEJUNUM; DX W/WO COLLECTION SPECIMN, BY BRUSH OR WASH;  Surgeon: Mayford Knife, MD;  Location: [facility];  Service: Gastroenterology   ??? SKIN BIOPSY               Family History   Problem Relation Age of Onset   ??? Alzheimer's disease Father    ??? Melanoma Neg Hx    ??? Basal cell carcinoma Neg Hx    ??? Squamous cell carcinoma Neg Hx         Allergies: Patient has no known allergies. Objective Findings  Precautions / Restrictions  Precautions: Falls precautions, Protective precautions, Other precautions (bleeding)  Weight Bearing Status: Non-applicable  Required Braces or Orthoses: Non-applicable     Communication Preference: Verbal          Pain Comments: denied  Medical Tests / Procedures: chart reviewed  Equipment / Environment: Patient not wearing mask for full session, Vascular access (PIV, TLC, Port-a-cath, PICC), Other (GI capsule belt)     At Rest: VSS  With Activity: NAD  Orthostatics: asymptomatic        Living Situation  Living Environment: Mobile home  Lives With: Daughter, Significant other (Daughters provide assistance for his ex wife)  Home Living: One level home, Stairs to enter with rails, Tub/shower unit, Tub bench, Standard height toilet  Rail placement (outside): Bilateral rails  Number of Stairs to Enter (outside): 6      Cognition: WFL  Visual/Perception: Within Functional Limits           Upper Extremities  UE Strength: Right WFL, Left WFL    Lower Extremities  LE Strength: Left WFL, Right WFL  LE comment: Proximal hip weakness however WFL     Sensation: WFL  Balance: WFL      Bed Mobility: not assessed     Transfers: Sit to Stand  Sit to Stand assistance level: Independent  Transfer comments: pt stands with a wide BOS and requires BUE support      Gait Level of Assistance: Modified independent, requires aide device or extra time  Gait Assistive Device: None  Gait Distance Ambulated (ft): 40 ft  Gait: Pt ambulated with increased postural sway but overall mod IND and at his baseline     Stairs: not assessed            Endurance: Fair     Physical Therapy Session Duration  PT Individual [mins]: 8  PT Co-Treatment [mins]: 12     Medical Staff Made Aware: RN     I attest that I have reviewed the above information.  Signed: Salome Holmes, PT  Filed 03/15/2021     The care for this patient was completed by Salome Holmes, PT:  A student was present and Observed patient care.    Salome Holmes, PT

## 2021-03-15 NOTE — Unmapped (Signed)
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 *      Recurrent GI bleed; hx hemorrhoids:   Per chart review, patient was recently admitted for hemorrhagic shock 01/29/2021-02/07/2021 where he was found to have transformation of his CMML to acute leukemia. During his admission, he was noted to have history of frequent bright red bowel movements with a 9 point drop in Hb over 1 month. He has known history of gastric ulcers however he is on a daily PPI. CT A/P 11/26 was negative for bleed as well as prior colonoscopies.  Ended up receiving upper endoscopy, colonoscopy and capsule endoscopy only significant for angiectasia's within the small bowel, likely the source of his recurrent bleeding. Current presentation similar to prior episodes.    Will follow-up with GI to perform capsule study to hopefully elucidate cause of bleeding.  If no obvious etiology will require 24 hours of stability without need for additional transfusions prior to discharge.  -GI consult, appreciate recommendations  -plan for capsule endoscopy tomorrow  - Clear liquid diet, n.p.o. at midnight  - 2 L GoLytely at 1600 on 03/14/21  -Resume home PPI, Protonix 40 mg twice daily  - Maintain type and screen  - Transfuse for hemoglobin less than 7, PLT<10, received 2 unit of blood 03/13/2021  - Follow up GIPP  ??  Thrombocytosis:   Pt with extreme/acute thrombocytosis, PLT>2000 downtrending, previously 100s in December. Potentially reactive thrombocytosis given iron deficiency anemia/blood loss. Can also be due to infection - Viral, bacterial, but less likely given lack of clear infectious source/afebrile.  Other etiologies also include reaction to chemotherapy affecting his myeloid progenitor cells, however this would also be extraordinarily rare and not usually associated with his chemotherapy.  Significant thrombocytosis can be associated with increased risk of bleeding due to acquired von Willebrand Syndrome.  Von Willebrand panel normal.  - Daily CBC/Diff  - Hold ASA while testing for AVWS  ??  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 On monthly azacitidine since 09/2018 for CMML, stopped 12/30/20. Patient developed GI bleeding and worsening leukocytosis and was admitted to [facility] for management on 01/29/21. BMBx identified??progression to??AML. Started Aza/Ven 02/14/2021. S/p C1 Aza/Ven??with BMBx revealing morphologic remission. Recent bone marrow biopsy results, showed <1% blasts, revealing CR. Due for cycle 2 Aza/Ven on 1/9. Per 1/5 note from Dr. Vertell Limber, if [facility] >=1,000, patient will require a 1-week delay for his next cycle of treatment. Plan is to coordinate future treatment cycles to be done locally??with [name] at [facility].  - Continue Valtrex 500 mg daily  - Compazine 5 mg daily q6h PRN  ??  Chronic Problems:  Iron deficiency anemia:  Normocytic anemia:  Iron studies revealed iron deficiency anemia. He was started on ferrous sulfate last visit.   Started on Ferrous Sulfate supplementation at last clinic visit  - Held ferrous sulfate 325 mg daily secondary to receiving blood products  GERD:  - pantoprazole 40 mg BID  Hypertension:  - Hold home  hydrochlorothiazide 12.5 mg daily given presyncopal episode, consider restarting tomorrow  Hypokalemia: Replete PRN  Other/chronic:  - Melatonin 3 mg nightly PRN        Daily Checklist:  Diet: Regular Diet  DVT PPx: Contraindicated - High Risk for Bleeding/Active Bleeding  Electrolytes: Potassium Repleted  Code Status: Full Code    Team Contact Information:   Primary Team: Hematology Resident (MEDE)  Primary Resident: Gary Laurence, MD  Resident's Pager: [phone] (Hematology Intern - Blue)    Interval History:   No acute events.  Patient feeling well, is comfortable as long as he can continue to drink his green Gatorade.  Started GoLytely cleanout.    ROS: Denies headache, chest pain, shortness of breath, abdominal pain    Objective:     Temp:  [35.9 ??C (96.6 ??F)-37.2 ??C (99 ??F)] 36.8 ??C (98.2 ??F)  Heart Rate:  [95-106] 97  Resp:  [16-20] 16  BP: (106-129)/(54-66) 114/59  SpO2:  [96 %-100 %] 100 %,     Intake/Output Summary (Last 24 hours) at 03/14/2021 1600  Last data filed at 03/14/2021 0846  Gross per 24 hour   Intake 545 ml   Output 475 ml   Net 70 ml       Gen: Well-appearing in no acute distress.  Alert and oriented x3.  Eyes: Sclera anicteric, EOMI, PERRLA,  HENT: atraumatic, normocephalic, MMM. OP w/o erythema or exudate   Neck: no cervical lymphadenopathy or thyromegaly, no JVD  Heart: RRR, S1, S2, no M/R/G, no chest wall tenderness  Lungs: CTAB, no crackles or wheezes, no use of accessory muscles  Abdomen: Normoactive bowel sounds, soft, NTND, no rebound/guarding, no hepatosplenomegaly  Extremities: no clubbing, cyanosis, or edema: pulses are +2 in bilateral upper and lower extremities  Neuro: CN II-XI grossly intact, normal cerebellar function, normal gait. No focal deficits.  Skin:  No rashes, lesions on clothed exam  Psych: Alert, oriented, normal mood and affect.     Labs/Studies: Labs and Studies from the last 24hrs per EMR and Reviewed    Corene Cornea, MD  PGY1 Internal Medicine - Pediatrics

## 2021-03-15 NOTE — Unmapped (Signed)
Patient is alert,oriented.NPO from 0000  for endoscopy today.  Problem: Adult Inpatient Plan of Care  Goal: Plan of Care Review  03/15/2021 0521 by Michelle Nasuti, RN  Outcome: Progressing  03/15/2021 0521 by Michelle Nasuti, RN  Outcome: Progressing  Goal: Patient-Specific Goal (Individualized)  03/15/2021 0521 by Michelle Nasuti, RN  Outcome: Progressing  03/15/2021 0521 by Michelle Nasuti, RN  Outcome: Progressing  Goal: Absence of Hospital-Acquired Illness or Injury  03/15/2021 0521 by Michelle Nasuti, RN  Outcome: Progressing  03/15/2021 0521 by Michelle Nasuti, RN  Outcome: Progressing  Intervention: Identify and Manage Fall Risk  Recent Flowsheet Documentation  Taken 03/15/2021 0400 by Michelle Nasuti, RN  Safety Interventions:   fall reduction program maintained   environmental modification   lighting adjusted for tasks/safety   low bed   nonskid shoes/slippers when out of bed  Taken 03/14/2021 2100 by Michelle Nasuti, RN  Safety Interventions:   fall reduction program maintained   environmental modification   low bed   lighting adjusted for tasks/safety  Intervention: Prevent and Manage VTE (Venous Thromboembolism) Risk  Recent Flowsheet Documentation  Taken 03/15/2021 0400 by Michelle Nasuti, RN  Activity Management: activity adjusted per tolerance  Taken 03/15/2021 0000 by Michelle Nasuti, RN  Activity Management: activity adjusted per tolerance  Taken 03/14/2021 2100 by Michelle Nasuti, RN  Activity Management: activity adjusted per tolerance  Goal: Optimal Comfort and Wellbeing  03/15/2021 0521 by Michelle Nasuti, RN  Outcome: Progressing  03/15/2021 0521 by Michelle Nasuti, RN  Outcome: Progressing  Goal: Readiness for Transition of Care  03/15/2021 0521 by Michelle Nasuti, RN  Outcome: Progressing  03/15/2021 0521 by Michelle Nasuti, RN  Outcome: Progressing  Goal: Rounds/Family Conference  03/15/2021 0521 by Michelle Nasuti, RN  Outcome: Progressing  03/15/2021 0521 by Michelle Nasuti, RN  Outcome: Progressing     Problem: Infection  Goal: Absence of Infection Signs and Symptoms  03/15/2021 0521 by Michelle Nasuti, RN  Outcome: Progressing  03/15/2021 0521 by Michelle Nasuti, RN  Outcome: Progressing     Problem: Fall Injury Risk  Goal: Absence of Fall and Fall-Related Injury  Outcome: Progressing  Intervention: Promote Injury-Free Environment  Recent Flowsheet Documentation  Taken 03/15/2021 0400 by Michelle Nasuti, RN  Safety Interventions:   fall reduction program maintained   environmental modification   lighting adjusted for tasks/safety   low bed   nonskid shoes/slippers when out of bed  Taken 03/14/2021 2100 by Michelle Nasuti, RN  Safety Interventions:   fall reduction program maintained   environmental modification   low bed   lighting adjusted for tasks/safety

## 2021-03-15 NOTE — Unmapped (Signed)
Pt A+Ox4 with VSS throughout shift. Up in chair for majority of day. Started GoLytely this afternoon for endoscopy tomorrow. NPO starting MN. No complaints of pain or nausea. No PRNs required. No falls or injuries with all safety measures maintained.     Problem: Adult Inpatient Plan of Care  Goal: Plan of Care Review  Outcome: Progressing  Goal: Patient-Specific Goal (Individualized)  Outcome: Progressing  Goal: Absence of Hospital-Acquired Illness or Injury  Outcome: Progressing  Intervention: Identify and Manage Fall Risk  Recent Flowsheet Documentation  Taken 03/14/2021 0846 by Felipa Furnace, RN  Safety Interventions:   fall reduction program maintained   environmental modification   lighting adjusted for tasks/safety   low bed   nonskid shoes/slippers when out of bed  Intervention: Prevent and Manage VTE (Venous Thromboembolism) Risk  Recent Flowsheet Documentation  Taken 03/14/2021 0846 by Felipa Furnace, RN  Activity Management: activity adjusted per tolerance  Goal: Optimal Comfort and Wellbeing  Outcome: Progressing  Goal: Readiness for Transition of Care  Outcome: Progressing  Goal: Rounds/Family Conference  Outcome: Progressing     Problem: Infection  Goal: Absence of Infection Signs and Symptoms  Outcome: Progressing

## 2021-03-15 NOTE — Unmapped (Signed)
OCCUPATIONAL THERAPY  Evaluation (03/15/21 1359)    Patient Name:  Gary Baird       Medical Record Number: [id number]   Date of Birth: December 28, 1940  Sex: Male          OT Treatment Diagnosis:  De    Problem List: Decreased endurance      Clinical Decision Making: Low     Assessment: Gary Baird is a 81 y.o. 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 [name] presents to skilled acute OT with decreased activity tolerance demonstrating mod I/independence with all functional performance. Pt reports being mod I/independent at functional baseline. He will benefit from skilled acute OT to maximize functional safety/independence with ADL/IADL routines, as well as prevent further deconditioning during hospital admission. No post-acute OT is recommended at this time. Based on review of comorbidities, personal factors, and clinical presentaiton of exam findings, pt demonstrates a low complexity case for evaluation and treatment.     Today's Interventions: Other  Today's Interventions: Pt completed and assisted with sitting balance/tolerance, functional transfers, standing balance/tolerance, and functional mobility. Pt provided education re: role of skilled acute OT, importance of EOB/OOB activity, energy conservation, DME, and OT POC.    Activity Tolerance During Today's Session  Tolerated treatment well    Plan  Planned Frequency of Treatment:  1-2x per day for: 2-3x week  Planned Treatment Duration: 03/29/21    Planned Interventions:  Adaptive equipment, ADL retraining, Balance activities, Bed mobility, Compensatory tech. training, Conservation, Education - Patient, Home exercise program, Functional mobility, Functional cognition, Environmental support, Endurance activities, Education - Family / caregiver, Therapeutic exercise, UE Strength / coordination exercise, Transfer training, Safety education    Post-Discharge Occupational Therapy Recommendations:   OT services not indicated   OT DME Recommendations: None -      GOALS:   Patient and Family Goals: To return home    IP Long Term Goal #1: Pt will score 24/24 on the AMPAC within 4 weeks     Short Term:  Pt will complete toilet t/f and toileting routine with mod I + LRAD prn   Time Frame : 2 weeks  Pt will complete full body dressing including clothing retrieval with mod I + LRAD prn   Time Frame : 2 weeks  Pt will complete 5+ min standing grooming with mod I + LRAD prn   Time Frame : 2 weeks    Prognosis:  Good  Positive Indicators:  PLOF  Barriers to Discharge: None    Subjective  Current Status Pt received/left sitting in recliner with call bell in reach, all immediate needs met, and RN aware  Prior Functional Status Pt reports mod I/independence at functional baseline. He intermittently uses a cane for functional mobility and denies the use of any other equipment to assist with daily activities/routines. Pt enjoys cutting grass with his riding lawn mower, and the last time he used his mower was last Sunday. He described being in the ED this admission and passing out in a wheelchair on his way to the bathroom, and staff lowered him to the ground. He otherwise denied any falls.    Medical Tests / Procedures: Reviewed       Patient / Caregiver reports: Pt agreeable to OT eval    Past Medical History:   Diagnosis Date   ??? Cancer (CMS-HCC)    ??? Hypertension  Social History     Tobacco Use   ??? Smoking status: Never   ??? Smokeless tobacco: Never   Substance Use Topics   ??? Alcohol use: Not Currently      Past Surgical History:   Procedure Laterality Date   ??? HERNIA REPAIR  1975   ??? PR COLONOSCOPY FLX DX W/COLLJ SPEC WHEN PFRMD N/A 02/02/2021    Procedure: COLONOSCOPY, FLEXIBLE, PROXIMAL TO SPLENIC FLEXURE; DIAGNOSTIC, W/WO COLLECTION SPECIMEN BY BRUSH OR WASH;  Surgeon: Mayford Knife, MD;  Location: [facility];  Service: Gastroenterology   ??? PR GASTROINTESTINAL TRACT IMAGING ESOPHAGUS W/I&R N/A 02/02/2021    Procedure: GI PATENCY TRACT IMAGING, INTRALUMINAL (EG, CAPSULE ENDOSCOPY), ESOPHAGUS W/PHYSICIAN INTERP/REPORT;  Surgeon: Mayford Knife, MD;  Location: [facility];  Service: Gastroenterology   ??? PR UPPER GI ENDOSCOPY,DIAGNOSIS N/A 02/02/2021    Procedure: UGI ENDO, INCLUDE ESOPHAGUS, STOMACH, & DUODENUM &/OR JEJUNUM; DX W/WO COLLECTION SPECIMN, BY BRUSH OR WASH;  Surgeon: Mayford Knife, MD;  Location: [facility];  Service: Gastroenterology   ??? SKIN BIOPSY      Family History   Problem Relation Age of Onset   ??? Alzheimer's disease Father    ??? Melanoma Neg Hx    ??? Basal cell carcinoma Neg Hx    ??? Squamous cell carcinoma Neg Hx         Patient has no known allergies.     Objective Findings  Precautions / Restrictions  Falls precautions, Protective precautions, Other precautions (bleeding)    Weight Bearing  Non-applicable    Required Braces or Orthoses  Non-applicable    Communication Preference  Verbal    Pain  Pt denied pain    Equipment / Environment  Patient not wearing mask for full session, Vascular access (PIV, TLC, Port-a-cath, PICC), Other (GI capsule belt)    Living Situation  Living Environment: Mobile home  Lives With: Daughter, Significant other (Daughters provide assistance for his ex wife)  Home Living: One level home, Stairs to enter with rails, Tub/shower unit, Tub bench, Standard height toilet  Rail placement (outside): Bilateral rails  Number of Stairs to Enter (outside): 6  Equipment available at home: [employer], [place], Other (tub bench)     Cognition   Orientation Level:  Oriented x 4   Arousal/Alertness:  Appropriate responses to stimuli   Attention Span:  Appears intact   Memory:  Appears intact   Following Commands:  Follows all commands and directions without difficulty   Safety Judgment:  Decreased awareness of need for safety   Awareness of Errors:  Good awareness of errors made   Problem Solving:  Able to problem solve independently   Comments:      Vision / Hearing   Vision: Wears glasses for reading only, Glasses present     Hearing: No deficit identified       Hand Function:  Right Hand Function: Right hand grip strength, ROM and coordination WNL  Left Hand Function: Left hand grip strength, ROM and coordination WNL  Hand Dominance: Right    Skin Inspection:  Skin Inspection: Intact where visualized    ROM / Strength:  UE ROM/Strength: Left WFL, Right WFL  LE ROM/Strength: Left WFL, Right WFL    Coordination:  Coordination: WFL    Sensation:  Sensory/ Proprioception/ Stereognosis comments: NT    Balance:  sitting: independent    Functional Mobility  Transfer Assistance Needed: No (pt completed sit<>stand at recliner with  mod I + arm rests and increased time)  Bed Mobility Assistance Needed:  (NT - pt received/left OOB)  Ambulation: Pt completed functional mobility at room-level with mod I, increased time    ADLs  ADLs: Modified Independent, Independent, Supervision (given pt's current level of functional performance, anticipate mod I/independent for most ADLs but supervision for prolonged standing/fatiguing activity including standing bathing)    Vitals / Orthostatics  At Rest: NAD  With Activity: NAD  Vitals/Orthostatics: Asymptomatic    Medical Staff Made Aware: RN    Occupational Therapy Session Duration  OT Individual [mins]: 10  OT Co-Treatment [mins]: 10 (w/ Sandria Senter, PT)  Reason for Co-treatment: Poor activity tolerance       I attest that I have reviewed the above information.  Signed: Ancil Linsey, OT  Filed 03/15/2021

## 2021-03-16 ENCOUNTER — Other Ambulatory Visit: Payer: Self-pay | Admitting: *Deleted

## 2021-03-16 DIAGNOSIS — C9310 Chronic myelomonocytic leukemia not having achieved remission: Secondary | ICD-10-CM

## 2021-03-16 DIAGNOSIS — K922 Gastrointestinal hemorrhage, unspecified: Secondary | ICD-10-CM | POA: Diagnosis not present

## 2021-03-16 DIAGNOSIS — K5520 Angiodysplasia of colon without hemorrhage: Secondary | ICD-10-CM | POA: Diagnosis not present

## 2021-03-16 LAB — CBC W/ AUTO DIFF
BASOPHILS ABSOLUTE COUNT: 0.2 10*9/L — ABNORMAL HIGH (ref 0.0–0.1)
BASOPHILS RELATIVE PERCENT: 8.1 %
EOSINOPHILS ABSOLUTE COUNT: 0 10*9/L (ref 0.0–0.5)
EOSINOPHILS RELATIVE PERCENT: 2.1 %
HEMATOCRIT: 22.8 % — ABNORMAL LOW (ref 39.0–48.0)
HEMOGLOBIN: 7.6 g/dL — ABNORMAL LOW (ref 12.9–16.5)
LYMPHOCYTES ABSOLUTE COUNT: 0.5 10*9/L — ABNORMAL LOW (ref 1.1–3.6)
LYMPHOCYTES RELATIVE PERCENT: 24.8 %
MEAN CORPUSCULAR HEMOGLOBIN CONC: 33.6 g/dL (ref 32.0–36.0)
MEAN CORPUSCULAR HEMOGLOBIN: 26.7 pg (ref 25.9–32.4)
MEAN CORPUSCULAR VOLUME: 79.6 fL (ref 77.6–95.7)
MONOCYTES ABSOLUTE COUNT: 0.4 10*9/L (ref 0.3–0.8)
MONOCYTES RELATIVE PERCENT: 20.3 %
NEUTROPHILS ABSOLUTE COUNT: 1 10*9/L — ABNORMAL LOW (ref 1.8–7.8)
NEUTROPHILS RELATIVE PERCENT: 44.7 %
PLATELET COUNT: >1138 10*9/L (ref 150–450)
RED BLOOD CELL COUNT: 2.86 10*12/L — ABNORMAL LOW (ref 4.26–5.60)
RED CELL DISTRIBUTION WIDTH: 22 % — ABNORMAL HIGH (ref 12.2–15.2)
WBC ADJUSTED: 2.2 10*9/L — ABNORMAL LOW (ref 3.6–11.2)

## 2021-03-16 LAB — PHOSPHORUS: PHOSPHORUS: 3.1 mg/dL (ref 2.4–5.1)

## 2021-03-16 LAB — BASIC METABOLIC PANEL
ANION GAP: 8 mmol/L (ref 5–14)
BLOOD UREA NITROGEN: 6 mg/dL — ABNORMAL LOW (ref 9–23)
BUN / CREAT RATIO: 8
CALCIUM: 8.5 mg/dL — ABNORMAL LOW (ref 8.7–10.4)
CHLORIDE: 105 mmol/L (ref 98–107)
CO2: 29 mmol/L (ref 20.0–31.0)
CREATININE: 0.72 mg/dL
EGFR CKD-EPI (2021) MALE: >90 mL/min/{1.73_m2} (ref >=60)
GLUCOSE RANDOM: 103 mg/dL (ref 70–179)
POTASSIUM: 3.5 mmol/L (ref 3.4–4.8)
SODIUM: 142 mmol/L (ref 135–145)

## 2021-03-16 LAB — HEPATIC FUNCTION PANEL
ALBUMIN: 2.8 g/dL — ABNORMAL LOW (ref 3.4–5.0)
ALKALINE PHOSPHATASE: 109 U/L (ref 46–116)
ALT (SGPT): <7 U/L — ABNORMAL LOW (ref 10–49)
AST (SGOT): 10 U/L (ref <=34)
BILIRUBIN DIRECT: 0.1 mg/dL (ref 0.00–0.30)
BILIRUBIN TOTAL: 0.3 mg/dL (ref 0.3–1.2)
PROTEIN TOTAL: 6 g/dL (ref 5.7–8.2)

## 2021-03-16 LAB — SLIDE REVIEW

## 2021-03-16 LAB — MAGNESIUM: MAGNESIUM: 1.8 mg/dL (ref 1.6–2.6)

## 2021-03-16 MED ADMIN — hydroCHLOROthiazide (HYDRODIURIL) tablet 12.5 mg: 12.5 mg | ORAL | @ 23:00:00

## 2021-03-16 MED ADMIN — valACYclovir (VALTREX) tablet 500 mg: 500 mg | ORAL | @ 15:00:00

## 2021-03-16 MED ADMIN — pantoprazole (PROTONIX) EC tablet 40 mg: 40 mg | ORAL | @ 03:00:00

## 2021-03-16 MED ADMIN — pantoprazole (PROTONIX) EC tablet 40 mg: 40 mg | ORAL | @ 15:00:00

## 2021-03-16 MED ADMIN — potassium chloride CR tablet 40 mEq: 40 meq | ORAL | @ 03:00:00 | Stop: 2021-03-15

## 2021-03-16 MED ADMIN — sodium chloride (NS) 0.9 % infusion: 20 mL/h | INTRAVENOUS | @ 20:00:00

## 2021-03-16 MED ADMIN — magnesium oxide (MAG-OX) tablet 400 mg: 400 mg | ORAL | @ 15:00:00

## 2021-03-16 NOTE — Unmapped (Signed)
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 *      Recurrent GI bleed; hx hemorrhoids:   Per chart review, patient was recently admitted for hemorrhagic shock 01/29/2021-02/07/2021 where he was found to have transformation of his CMML to acute leukemia. During his admission, he was noted to have history of frequent bright red bowel movements with a 9 point drop in Hb over 1 month. He has known history of gastric ulcers however he is on a daily PPI. CT A/P 11/26 was negative for bleed as well as prior colonoscopies.  Ended up receiving upper endoscopy, colonoscopy and capsule endoscopy only significant for angiectasia's within the small bowel, likely the source of his recurrent bleeding. Current presentation similar to prior episodes.    Will follow-up with GI to perform capsule study to hopefully elucidate cause of bleeding.  If no obvious etiology will require 24 hours of stability without need for additional transfusions prior to discharge.  -GI consult, appreciate recommendations  -completed capsule endoscopy today, follow-up on results  -Continue home PPI, Protonix 40 mg twice daily  - Maintain type and screen  - Transfuse for hemoglobin less than 7, PLT<10, received 1 unit of blood on 03/16/2021 in preparation for discharge (hemoglobin 7.6)  - Follow up GIPP  ??  Thrombocytosis:   Pt with extreme/acute thrombocytosis, PLT>2000 downtrending, previously 100s in December. Potentially reactive thrombocytosis given iron deficiency anemia/blood loss. Can also be due to infection - Viral, bacterial, but less likely given lack of clear infectious source/afebrile.  Other etiologies also include reaction to chemotherapy affecting his myeloid progenitor cells, however this would also be extraordinarily rare and not usually associated with his chemotherapy.  Significant thrombocytosis can be associated with increased risk of bleeding due to acquired von Willebrand Syndrome.  Von Willebrand panel normal.  - Daily CBC/Diff  - Hold ASA while testing for AVWS  ??  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 On monthly azacitidine since 09/2018 for CMML, stopped 12/30/20. Patient developed GI bleeding and worsening leukocytosis and was admitted to [facility] for management on 01/29/21. BMBx identified??progression to??AML. Started Aza/Ven 02/14/2021. S/p C1 Aza/Ven??with BMBx revealing morphologic remission. Recent bone marrow biopsy results, showed <1% blasts, revealing CR. Due for cycle 2 Aza/Ven on 1/9. Per 1/5 note from Dr. Vertell Limber, if [facility] >=1,000, patient will require a 1-week delay for his next cycle of treatment. Plan is to coordinate future treatment cycles to be done locally??with [name] at [facility].  - Continue Valtrex 500 mg daily  - Compazine 5 mg daily q6h PRN  ??  Chronic Problems:  Iron deficiency anemia:  Normocytic anemia:  Iron studies revealed iron deficiency anemia. He was started on ferrous sulfate last visit.   Discontinue ferrous sulfate supplementation secondary to receiving blood further.  Consider starting on discharge if not transfusion dependent.  GERD:  - pantoprazole 40 mg BID  Hypertension:  - Hold home hydrochlorothiazide 12.5 mg daily given presyncopal episode, consider restarting tomorrow  Hypokalemia:  Replete PRN  Other/chronic:  - Melatonin 9 mg nightly PRN        Daily Checklist:  Diet: Regular Diet  DVT PPx: Contraindicated - High Risk for Bleeding/Active Bleeding  Electrolytes: Potassium Repleted  Code Status: Full Code    Team Contact Information:   Primary Team: Hematology Resident (MEDE)  Primary Resident: Nelly Laurence, MD  Resident's Pager: [phone] (Hematology Intern - Blue)    Interval History:   No acute events.  Tolerated capsule endoscopy well.    ROS: Denies headache, chest pain, shortness of breath, abdominal pain    Objective:     Temp:  [35.6 ??C (96.1 ??F)-36.8 ??C (98.2 ??F)] 35.6 ??C (96.1 ??F)  Heart Rate:  [97-102] 97  Resp:  [18] 18  BP: (111-129)/(51-69) 111/51  SpO2:  [100 %] 100 %,     Intake/Output Summary (Last 24 hours) at 03/16/2021 1459  Last data filed at 03/15/2021 1836  Gross per 24 hour   Intake 360 ml   Output 250 ml   Net 110 ml       Gen: Well-appearing in no acute distress.  Alert and oriented x3.  Eyes: Sclera anicteric, EOMI, PERRLA,  HENT: atraumatic, normocephalic, MMM. OP w/o erythema or exudate   Neck: no cervical lymphadenopathy or thyromegaly, no JVD  Heart: RRR, S1, S2, no M/R/G, no chest wall tenderness  Lungs: CTAB, no crackles or wheezes, no use of accessory muscles  Abdomen: Normoactive bowel sounds, soft, NTND, no rebound/guarding, no hepatosplenomegaly  Extremities: no clubbing, cyanosis, or edema: pulses are +2 in bilateral upper and lower extremities  Neuro: CN II-XI grossly intact, normal cerebellar function, normal gait. No focal deficits.  Skin:  No rashes, lesions on clothed exam  Psych: Alert, oriented, normal mood and affect.     Labs/Studies: Labs and Studies from the last 24hrs per EMR and Reviewed    Corene Cornea, MD  PGY1 Internal Medicine - Pediatrics

## 2021-03-16 NOTE — Unmapped (Signed)
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 *      Recurrent GI bleed; hx hemorrhoids:   Per chart review, patient was recently admitted for hemorrhagic shock 01/29/2021-02/07/2021 where he was found to have transformation of his CMML to acute leukemia. During his admission, he was noted to have history of frequent bright red bowel movements with a 9 point drop in Hb over 1 month. He has known history of gastric ulcers however he is on a daily PPI. CT A/P 11/26 was negative for bleed as well as prior colonoscopies.  Ended up receiving upper endoscopy, colonoscopy and capsule endoscopy only significant for angiectasia's within the small bowel, likely the source of his recurrent bleeding. Current presentation similar to prior episodes.    Will follow-up with GI to perform capsule study to hopefully elucidate cause of bleeding.  If no obvious etiology will require 24 hours of stability without need for additional transfusions prior to discharge.  -GI consult, appreciate recommendations  -completed capsule endoscopy today, follow-up on results  -Continue home PPI, Protonix 40 mg twice daily  - Maintain type and screen  - Transfuse for hemoglobin less than 7, PLT<10, received 2 unit of blood 03/13/2021  - Follow up GIPP  ??  Thrombocytosis:   Pt with extreme/acute thrombocytosis, PLT>2000 downtrending, previously 100s in December. Potentially reactive thrombocytosis given iron deficiency anemia/blood loss. Can also be due to infection - Viral, bacterial, but less likely given lack of clear infectious source/afebrile.  Other etiologies also include reaction to chemotherapy affecting his myeloid progenitor cells, however this would also be extraordinarily rare and not usually associated with his chemotherapy.  Significant thrombocytosis can be associated with increased risk of bleeding due to acquired von Willebrand Syndrome.  Von Willebrand panel normal.  - Daily CBC/Diff  - Hold ASA while testing for AVWS  ??  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 On monthly azacitidine since 09/2018 for CMML, stopped 12/30/20. Patient developed GI bleeding and worsening leukocytosis and was admitted to [facility] for management on 01/29/21. BMBx identified??progression to??AML. Started Aza/Ven 02/14/2021. S/p C1 Aza/Ven??with BMBx revealing morphologic remission. Recent bone marrow biopsy results, showed <1% blasts, revealing CR. Due for cycle 2 Aza/Ven on 1/9. Per 1/5 note from Dr. Vertell Limber, if [facility] >=1,000, patient will require a 1-week delay for his next cycle of treatment. Plan is to coordinate future treatment cycles to be done locally??with [name] at [facility].  - Continue Valtrex 500 mg daily  - Compazine 5 mg daily q6h PRN  ??  Chronic Problems:  Iron deficiency anemia:  Normocytic anemia:  Iron studies revealed iron deficiency anemia. He was started on ferrous sulfate last visit.   Discontinue ferrous sulfate supplementation secondary to receiving blood further.  Consider starting on discharge if not transfusion dependent.  GERD:  - pantoprazole 40 mg BID  Hypertension:  - Hold home hydrochlorothiazide 12.5 mg daily given presyncopal episode, consider restarting tomorrow  Hypokalemia: Replete PRN  Other/chronic:  - Melatonin  9 mg nightly PRN        Daily Checklist:  Diet: Regular Diet  DVT PPx: Contraindicated - High Risk for Bleeding/Active Bleeding  Electrolytes: Potassium Repleted  Code Status: Full Code    Team Contact Information: Primary Team: Hematology Resident (MEDE)  Primary Resident: Nelly Laurence, MD  Resident's Pager: [phone] (Hematology Intern - Blue)    Interval History:   No acute events.  Tolerated capsule endoscopy well.    ROS: Denies headache, chest pain, shortness of breath, abdominal pain    Objective:     Temp:  [36 ??C (96.8 ??F)-37.6 ??C (99.7 ??F)] 36.6 ??C (97.9 ??F)  Heart Rate:  [94-108] 94  Resp:  [18] 18  BP: (104-124)/(53-75) 117/56  SpO2:  [100 %] 100 %,     Intake/Output Summary (Last 24 hours) at 03/15/2021 1623  Last data filed at 03/14/2021 2100  Gross per 24 hour   Intake 200 ml   Output 300 ml   Net -100 ml       Gen: Well-appearing in no acute distress.  Alert and oriented x3.  Eyes: Sclera anicteric, EOMI, PERRLA,  HENT: atraumatic, normocephalic, MMM. OP w/o erythema or exudate   Neck: no cervical lymphadenopathy or thyromegaly, no JVD  Heart: RRR, S1, S2, no M/R/G, no chest wall tenderness  Lungs: CTAB, no crackles or wheezes, no use of accessory muscles  Abdomen: Normoactive bowel sounds, soft, NTND, no rebound/guarding, no hepatosplenomegaly  Extremities: no clubbing, cyanosis, or edema: pulses are +2 in bilateral upper and lower extremities  Neuro: CN II-XI grossly intact, normal cerebellar function, normal gait. No focal deficits.  Skin:  No rashes, lesions on clothed exam  Psych: Alert, oriented, normal mood and affect.     Labs/Studies: Labs and Studies from the last 24hrs per EMR and Reviewed    Corene Cornea, MD  PGY1 Internal Medicine - Pediatrics

## 2021-03-16 NOTE — Unmapped (Signed)
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    Recommendations:  - Clear liquid diet now; NPO at MN  - Please given 4 doses of Miralax (17g) in 12oz of gatorade now  - Single balloon enteroscopy 1/12    GI Pre-Procedure Checklist  Procedure: Single-Balloon Enteroscopy  Anticipated Date of Procedure: 1/12  Anticoagulants/Antiplatelets: Not Applicable  Diet: Order clear liquid and make NPO at 12AM (midnight) day of procedure    Thank you for involving [place] in the care of your patient. We will continue to follow along with you.     For questions, please contact the on-call fellow for the Gastroenterology (Luminal) Consult Service at [phone].

## 2021-03-17 DIAGNOSIS — D500 Iron deficiency anemia secondary to blood loss (chronic): Secondary | ICD-10-CM | POA: Diagnosis not present

## 2021-03-17 DIAGNOSIS — R933 Abnormal findings on diagnostic imaging of other parts of digestive tract: Secondary | ICD-10-CM | POA: Diagnosis not present

## 2021-03-17 DIAGNOSIS — K922 Gastrointestinal hemorrhage, unspecified: Secondary | ICD-10-CM | POA: Diagnosis not present

## 2021-03-17 LAB — CBC W/ AUTO DIFF
BASOPHILS ABSOLUTE COUNT: 0.2 10*9/L — ABNORMAL HIGH (ref 0.0–0.1)
BASOPHILS RELATIVE PERCENT: 6.3 %
EOSINOPHILS ABSOLUTE COUNT: 0 10*9/L (ref 0.0–0.5)
EOSINOPHILS RELATIVE PERCENT: 1.5 %
HEMATOCRIT: 25.9 % — ABNORMAL LOW (ref 39.0–48.0)
HEMOGLOBIN: 8.4 g/dL — ABNORMAL LOW (ref 12.9–16.5)
LYMPHOCYTES ABSOLUTE COUNT: 0.7 10*9/L — ABNORMAL LOW (ref 1.1–3.6)
LYMPHOCYTES RELATIVE PERCENT: 22.1 %
MEAN CORPUSCULAR HEMOGLOBIN CONC: 32.5 g/dL (ref 32.0–36.0)
MEAN CORPUSCULAR HEMOGLOBIN: 26 pg (ref 25.9–32.4)
MEAN CORPUSCULAR VOLUME: 79.9 fL (ref 77.6–95.7)
MEAN PLATELET VOLUME: 7.2 fL (ref 6.8–10.7)
MONOCYTES ABSOLUTE COUNT: 0.7 10*9/L (ref 0.3–0.8)
MONOCYTES RELATIVE PERCENT: 21.2 %
NEUTROPHILS ABSOLUTE COUNT: 1.6 10*9/L — ABNORMAL LOW (ref 1.8–7.8)
NEUTROPHILS RELATIVE PERCENT: 48.9 %
PLATELET COUNT: 940 10*9/L — ABNORMAL HIGH (ref 150–450)
RED BLOOD CELL COUNT: 3.25 10*12/L — ABNORMAL LOW (ref 4.26–5.60)
RED CELL DISTRIBUTION WIDTH: 21.5 % — ABNORMAL HIGH (ref 12.2–15.2)
WBC ADJUSTED: 3.3 10*9/L — ABNORMAL LOW (ref 3.6–11.2)

## 2021-03-17 LAB — MAGNESIUM
MAGNESIUM: 1.7 mg/dL (ref 1.6–2.6)
MAGNESIUM: 1.5 mg/dL — ABNORMAL LOW (ref 1.6–2.6)

## 2021-03-17 LAB — CBC
HEMATOCRIT: 26.2 % — ABNORMAL LOW (ref 39.0–48.0)
HEMOGLOBIN: 8.8 g/dL — ABNORMAL LOW (ref 12.9–16.5)
MEAN CORPUSCULAR HEMOGLOBIN CONC: 33.5 g/dL (ref 32.0–36.0)
MEAN CORPUSCULAR HEMOGLOBIN: 26.9 pg (ref 25.9–32.4)
MEAN CORPUSCULAR VOLUME: 80.3 fL (ref 77.6–95.7)
MEAN PLATELET VOLUME: 7.1 fL (ref 6.8–10.7)
PLATELET COUNT: 900 10*9/L — ABNORMAL HIGH (ref 150–450)
RED BLOOD CELL COUNT: 3.26 10*12/L — ABNORMAL LOW (ref 4.26–5.60)
RED CELL DISTRIBUTION WIDTH: 21.4 % — ABNORMAL HIGH (ref 12.2–15.2)
WBC ADJUSTED: 5.9 10*9/L (ref 3.6–11.2)

## 2021-03-17 LAB — SLIDE REVIEW

## 2021-03-17 LAB — BLOOD GAS CRITICAL CARE PANEL, VENOUS
BASE EXCESS VENOUS: 3.8 — ABNORMAL HIGH (ref -2.0–2.0)
CALCIUM IONIZED VENOUS (MG/DL): 4.48 mg/dL (ref 4.40–5.40)
GLUCOSE WHOLE BLOOD: 114 mg/dL (ref 70–179)
HCO3 VENOUS: 28 mmol/L — ABNORMAL HIGH (ref 22–27)
HEMOGLOBIN BLOOD GAS: 8.2 g/dL — ABNORMAL LOW
LACTATE BLOOD VENOUS: 1.7 mmol/L (ref 0.5–1.8)
O2 SATURATION VENOUS: 83.3 % (ref 40.0–85.0)
PCO2 VENOUS: 40 mmHg (ref 40–60)
PH VENOUS: 7.45 — ABNORMAL HIGH (ref 7.32–7.43)
PO2 VENOUS: 48 mmHg (ref 30–55)
POTASSIUM WHOLE BLOOD: 3.2 mmol/L — ABNORMAL LOW (ref 3.4–4.6)
SODIUM WHOLE BLOOD: 138 mmol/L (ref 135–145)

## 2021-03-17 LAB — COMPREHENSIVE METABOLIC PANEL
ALBUMIN: 2.8 g/dL — ABNORMAL LOW (ref 3.4–5.0)
ALKALINE PHOSPHATASE: 123 U/L — ABNORMAL HIGH (ref 46–116)
ALT (SGPT): <7 U/L — ABNORMAL LOW (ref 10–49)
ANION GAP: 9 mmol/L (ref 5–14)
AST (SGOT): 11 U/L (ref <=34)
BILIRUBIN TOTAL: 0.5 mg/dL (ref 0.3–1.2)
BLOOD UREA NITROGEN: 6 mg/dL — ABNORMAL LOW (ref 9–23)
BUN / CREAT RATIO: 8
CALCIUM: 8.6 mg/dL — ABNORMAL LOW (ref 8.7–10.4)
CHLORIDE: 101 mmol/L (ref 98–107)
CO2: 28 mmol/L (ref 20.0–31.0)
CREATININE: 0.73 mg/dL
EGFR CKD-EPI (2021) MALE: >90 mL/min/{1.73_m2} (ref >=60)
GLUCOSE RANDOM: 130 mg/dL (ref 70–179)
POTASSIUM: 3.6 mmol/L (ref 3.4–4.8)
PROTEIN TOTAL: 6.2 g/dL (ref 5.7–8.2)
SODIUM: 138 mmol/L (ref 135–145)

## 2021-03-17 LAB — URINALYSIS WITH CULTURE REFLEX
BACTERIA: NONE SEEN /HPF
BILIRUBIN UA: NEGATIVE
BLOOD UA: NEGATIVE
GLUCOSE UA: NEGATIVE
KETONES UA: NEGATIVE
LEUKOCYTE ESTERASE UA: NEGATIVE
NITRITE UA: NEGATIVE
PH UA: 6.5 (ref 5.0–9.0)
PROTEIN UA: NEGATIVE
RBC UA: <1 /HPF (ref <=3)
SPECIFIC GRAVITY UA: 1.006 (ref 1.003–1.030)
SQUAMOUS EPITHELIAL: <1 /HPF (ref 0–5)
UROBILINOGEN UA: <2
WBC UA: <1 /HPF (ref <=2)

## 2021-03-17 LAB — BASIC METABOLIC PANEL
ANION GAP: 6 mmol/L (ref 5–14)
BLOOD UREA NITROGEN: 6 mg/dL — ABNORMAL LOW (ref 9–23)
BUN / CREAT RATIO: 8
CALCIUM: 8.6 mg/dL — ABNORMAL LOW (ref 8.7–10.4)
CHLORIDE: 104 mmol/L (ref 98–107)
CO2: 29 mmol/L (ref 20.0–31.0)
CREATININE: 0.71 mg/dL
EGFR CKD-EPI (2021) MALE: >90 mL/min/{1.73_m2} (ref >=60)
GLUCOSE RANDOM: 100 mg/dL (ref 70–179)
POTASSIUM: 3.4 mmol/L (ref 3.4–4.8)
SODIUM: 139 mmol/L (ref 135–145)

## 2021-03-17 LAB — BLOOD GAS, VENOUS
BASE EXCESS VENOUS: 3.8 — ABNORMAL HIGH (ref -2.0–2.0)
HCO3 VENOUS: 28 mmol/L — ABNORMAL HIGH (ref 22–27)
O2 SATURATION VENOUS: 83.3 % (ref 40.0–85.0)
PCO2 VENOUS: 40 mmHg (ref 40–60)
PH VENOUS: 7.45 — ABNORMAL HIGH (ref 7.32–7.43)
PO2 VENOUS: 48 mmHg (ref 30–55)

## 2021-03-17 LAB — PHOSPHORUS: PHOSPHORUS: 2.9 mg/dL (ref 2.4–5.1)

## 2021-03-17 LAB — LACTATE, VENOUS, WHOLE BLOOD: LACTATE BLOOD VENOUS: 1.7 mmol/L (ref 0.5–1.8)

## 2021-03-17 MED ADMIN — propofoL (DIPRIVAN) injection: INTRAVENOUS | @ 17:00:00 | Stop: 2021-03-17

## 2021-03-17 MED ADMIN — potassium chloride CR tablet 40 mEq: 40 meq | ORAL | @ 19:00:00 | Stop: 2021-03-17

## 2021-03-17 MED ADMIN — EPINEPHrine (ADRENALIN) 0.1 mg/mL injection: INTRAVENOUS | @ 17:00:00 | Stop: 2021-03-17

## 2021-03-17 MED ADMIN — polyethylene glycol (GLYCOLAX) powder (BULK CONTAINER) 68 g: 68 g | ORAL | @ 03:00:00

## 2021-03-17 MED ADMIN — propofoL (DIPRIVAN) injection: INTRAVENOUS | @ 16:00:00 | Stop: 2021-03-17

## 2021-03-17 MED ADMIN — phenylephrine 0.8 mg/10 mL (80 mcg/mL) injection: INTRAVENOUS | @ 16:00:00 | Stop: 2021-03-17

## 2021-03-17 MED ADMIN — lactated Ringers infusion: 10 mL/h | INTRAVENOUS | @ 16:00:00

## 2021-03-17 MED ADMIN — magnesium oxide (MAG-OX) tablet 400 mg: 400 mg | ORAL | @ 19:00:00

## 2021-03-17 MED ADMIN — polyethylene glycol (GLYCOLAX) powder (BULK CONTAINER) 68 g: 68 g | ORAL | @ 13:00:00 | Stop: 2021-03-17

## 2021-03-17 MED ADMIN — acetaminophen (TYLENOL) tablet 1,000 mg: 1000 mg | ORAL | @ 23:00:00 | Stop: 2021-03-17

## 2021-03-17 MED ADMIN — pantoprazole (PROTONIX) EC tablet 40 mg: 40 mg | ORAL | @ 03:00:00

## 2021-03-17 MED ADMIN — piperacillin-tazobactam (ZOSYN) IVPB (premix) 4.5 g: 4.5 g | INTRAVENOUS | Stop: 2021-03-22

## 2021-03-17 MED ADMIN — prochlorperazine (COMPAZINE) tablet 5 mg: 5 mg | ORAL | @ 20:00:00

## 2021-03-17 MED ADMIN — sodium chloride 0.9% (NS) bolus 1,000 mL: 1000 mL | INTRAVENOUS | Stop: 2021-03-17

## 2021-03-17 MED ADMIN — hydroCHLOROthiazide (HYDRODIURIL) tablet 12.5 mg: 12.5 mg | ORAL | @ 19:00:00 | Stop: 2021-03-17

## 2021-03-17 MED ADMIN — propofol (DIPRIVAN) infusion 10 mg/mL: INTRAVENOUS | @ 16:00:00 | Stop: 2021-03-17

## 2021-03-17 MED ADMIN — EPINEPHrine (ADRENALIN) 0.1 mg/mL injection: INTRAVENOUS | @ 16:00:00 | Stop: 2021-03-17

## 2021-03-17 MED ADMIN — LORazepam (ATIVAN) tablet 0.5 mg: .5 mg | ORAL | @ 21:00:00 | Stop: 2021-03-17

## 2021-03-17 MED ADMIN — valACYclovir (VALTREX) tablet 500 mg: 500 mg | ORAL | @ 19:00:00

## 2021-03-17 MED ADMIN — furosemide (LASIX) injection 40 mg: 40 mg | INTRAVENOUS | @ 23:00:00 | Stop: 2021-03-17

## 2021-03-17 MED ADMIN — pantoprazole (PROTONIX) EC tablet 40 mg: 40 mg | ORAL | @ 19:00:00

## 2021-03-17 NOTE — Unmapped (Signed)
PACT Study Occupational Therapy Intervention     PACT Study (IRB [phone])  Date:  03/17/2020  Start time: 09:30  End time: 10:00  Cycle Number: 1  Day Number:43  Visit number:   Pain: None       Home Exercise Program      Muscle Group    Exercise    Sets    Reps    Frequency    Modifications   Wall Push Ups  2 10      Shoulder squeezes  2 10      Diaphragmatic breathing  As needed                    Notes:       Activity Log  Pt and care partner educated on purpose and how to complete          Interventions  Subject Intervention   Energy conservation Reviewed handouts in l binder    Cognition No needs    Falls prevention Reviewed handout in binder    ADL Use of cane as needed, reviewed energy conservation with ADL routine   IADL Use of cane as needed, reviewed energy conservation with IADl routine   Psychosocial  Reports feeling grumpy 2/2 ongoing hospitalization. Discussed frustrations around being hospitalized. Emotional support and theraputic listening provided.            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

## 2021-03-17 NOTE — Unmapped (Signed)
PACT Study Physical Therapy Intervention Multi-disciplinary meeting    PACT Study (IRB [phone])  Date: 03/17/2021   Start time: 932  End time: 1014  Cycle Number: 1  Day Number: 43      Patient Subjective and Discussion:    Gary Baird was seen today in his hospital room with his Daughter, [name]. He was  waiting for a GI procedure to be completed prior to discharging home and expressed frustration regarding the changing of discharge plans and not knowing when his procedure may take place. Thankfully transport arrived at the end of our discussion to take him down.     He has been ambulating daily out of his room using a SPC so he doesn't get weaker while he's in the hospital. He has not had any more episodes of lightheadedness or pre-syncope. He has not had any difficulty moving around while in the hospital.    Our discussion today primarily focused on maintaining his strength and endurance at discharge and how to improve it so he can stand from low surfaces with less difficulty and participate in meaningful activities. We verbally reviewed his PT exercises as well as the RPE scale with the goal of staying within range of 1-3.     We also talked about his family including his [place] Granddaughter who he calls Teaching laboratory technician and how he used to play baseball when he was younger.            PT Goals:     [name] will walk 15 minutes while maintaining RPE >/= 3 in 6 weeks.            Home Exercise Program     Muscle Group   Level   Exercise   Sets   Reps   Frequency   Modifications   Hip 2 Mini-squats 2 10 3x/week    Hip 2 Standing 3-way hip with support 3 10 3x/week      Notes:      Walking Program    Vitals  HR Rest 99   HR max 151   HRR 115     Prescription  Intensity 30% /RPE <3   Frequency 3x/week   Duration 15 minutes per day, broken into smaller segments as needed.      Notes:

## 2021-03-17 NOTE — Unmapped (Signed)
Hi,     Gary Baird  contacted the [employer] requesting to speak with the care team of Eliab Closson to discuss:    Patient's daughter states the patient requested all appts to be scheduled for mornings. If appt is later in the day due to no availability, she would like to add patient to waitlist for an earlier time.     Please contact [place] at [phone].    Thank you,   Durward Fortes  [facility]   [phone]

## 2021-03-17 NOTE — Unmapped (Signed)
Gastroenterology (Luminal) Consult Service   Progress Note         Assessment & Plan:   Gary Baird is a 81 y.o. 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     Patient is presented with lower GI bleeding. Patient previously with multiple EGDs and colonoscopies which have not identified a source of bleeding. 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 Capsule did not go through entire small bowel and colon. Patient then underwent single balloon enteroscopy 1/12. Patient with normal esophagus, stomach, duodenum. Jejunum normal, did not see AVMs. Placed tattoo at furthest extent reached. Patient has remained HDS, Hgb 8.4 this morning. Had multiple bowel movement without blood or melena.     Patient with multiple AVMs seen on VCE both in November and again this admission. Can refer to [facility] as outpatient for evaluation as they have the capability to double balloon enteroscopy as well as retrograde enteroscopy which allows for evaluation of the small bowel, however no current target on most recent VCE thus currently defer this option. Additionally, hematology could consider medical management with bevacizumab. Or can consider octreotide, however, this can be difficult to obtain in the outpatient setting.     Intravenous Bevacizumab Reduces Transfusion Requirements and Endoscopic Interventions in Patients With Gastric Antral Vascular Ectasia and Small Bowel Angioectasia ([email or web address]      Recommendations  - Resume previous diet today.   - Continue present medications.   - Monitor Hb and stools for blood.  - Plan for outpatient repeat VCE (order placed), for full evaluation of small bowel to plan for outpatient therapies. Depending on whether target lesions are identified, we can consider a referral to [facility] for consideration of further endoscopic evaluation with double balloon.  - Can consider starting medical therapy with octreotide vs bevacizumab for medical management of angioectasias. Appreciate the input of hematology on whether bevacizumab may an option.  - Will arrange follow up in GI clinic as outpatient for further management in the outpatient setting.    Thank you for involving [place] in the care of your patient. We will continue to follow along with you. This patient was seen and examined with Dr. Jacqulyn Bath.     For questions, please contact the on-call fellow for the Gastroenterology (Luminal) Consult Service at [phone].     I saw and evaluated the patient, participating in the key portions of the service.  I reviewed the resident???s note.  I agree with the resident???s findings and plan.            Esra Frankowski D. Dariel Betzer MD, MPH  Professor of Medicine  [employer] [facility]  Division of Gastroenterology and Hepatology        Interval History:   No acute events overnight. No further melena, hematochezia.    ROS: Denies headache, chest pain, shortness of breath, abdominal pain, nausea, vomiting.    Objective:   Temp:  [35.6 ??C (96.1 ??F)-36.6 ??C (97.9 ??F)] 36.4 ??C (97.6 ??F)  Heart Rate:  [89-100] 89  SpO2 Pulse:  [89-98] 89  Resp:  [16-28] 23  BP: (83-145)/(34-84) 126/72  SpO2:  [90 %-100 %] 99 %    Gen: NAD, answers questions appropriately  Eyes: Sclera anicteric  HENT: Atraumatic, normocephalic, MMM  Heart: Regular rate  Lungs: no use of accessory muscles  Abdomen: soft, NTND, no rebound/guarding  Extremities: No edema in the BLEs  Neuro: Normal speech. No focal deficits.  Skin:  No rashes, lesions on clothed exam  Psych: Alert, normal mood and affect.     Labs/Studies: Labs and Studies from the last 24hrs per EMR and Reviewed

## 2021-03-17 NOTE — Unmapped (Signed)
Biliary/Advanced Endoscopy Consult Service   Treatment Plan         Single Balloon Enteroscopy  - Normal esophagus.   - Normal stomach.   - Normal examined duodenum.   - The examined portion of the jejunum was normal.  Tattooed at most distal extent reached.   - No blood or active bleeding seen in the examine small bowel. The angiectasia seen on video capsule was not found.  - No specimens collected.     Recommendations  - Return patient to hospital ward for ongoing care.   - Resume previous diet today.   - Continue present medications.   - Monitor Hb and stools for blood.      Thank you for involving [place] in the care of your patient.    For questions, please contact the on-call fellow for the Gastroenterology (Luminal) Consult Service at [phone].

## 2021-03-17 NOTE — Unmapped (Signed)
Pt's A&Ox 4, VSS, afebrile, denies pain, 68 mg of Glycolax given before MN, NPO MN for enteroscopy, 4 BM without blood overnight per patient, call bell within reach, no fall or injuries overnight.      Problem: Adult Inpatient Plan of Care  Goal: Plan of Care Review  Outcome: Ongoing - Unchanged  Goal: Patient-Specific Goal (Individualized)  Outcome: Ongoing - Unchanged  Goal: Absence of Hospital-Acquired Illness or Injury  Outcome: Ongoing - Unchanged  Intervention: Identify and Manage Fall Risk  Recent Flowsheet Documentation  Taken 03/16/2021 2030 by Sharlee Blew, RN  Safety Interventions:  ??? fall reduction program maintained  ??? lighting adjusted for tasks/safety  ??? low bed  ??? nonskid shoes/slippers when out of bed  Intervention: Prevent Skin Injury  Recent Flowsheet Documentation  Taken 03/16/2021 2030 by Sharlee Blew, RN  Skin Protection: adhesive use limited  Intervention: Prevent and Manage VTE (Venous Thromboembolism) Risk  Recent Flowsheet Documentation  Taken 03/16/2021 2030 by Sharlee Blew, RN  Activity Management: up ad lib  Goal: Optimal Comfort and Wellbeing  Outcome: Ongoing - Unchanged  Goal: Readiness for Transition of Care  Outcome: Ongoing - Unchanged  Goal: Rounds/Family Conference  Outcome: Ongoing - Unchanged     Problem: Infection  Goal: Absence of Infection Signs and Symptoms  Outcome: Ongoing - Unchanged     Problem: Fall Injury Risk  Goal: Absence of Fall and Fall-Related Injury  Outcome: Ongoing - Unchanged  Intervention: Promote Injury-Free Environment  Recent Flowsheet Documentation  Taken 03/16/2021 2030 by Sharlee Blew, RN  Safety Interventions:  ??? fall reduction program maintained  ??? lighting adjusted for tasks/safety  ??? low bed  ??? nonskid shoes/slippers when out of bed

## 2021-03-17 NOTE — Unmapped (Signed)
A&Ox4, VSS, afebrile, free from falls/injuries. Denies pain, nausea, and vomiting. Scheduled medicines given. Falls precaution taken. Pt given Hydrochlorothiazide for BLE edema; no output yet. 1u pRBC given with no complication. Pt scheduled for Endoscopy tomorrow; Miralax will be given at night shift. On clear liquid diet. No acute events this shift.      Problem: Adult Inpatient Plan of Care  Goal: Plan of Care Review  Outcome: Ongoing - Unchanged  Goal: Patient-Specific Goal (Individualized)  Outcome: Ongoing - Unchanged  Goal: Absence of Hospital-Acquired Illness or Injury  Outcome: Ongoing - Unchanged  Intervention: Identify and Manage Fall Risk  Recent Flowsheet Documentation  Taken 03/16/2021 0945 by Georgia Duff, RN  Safety Interventions:   lighting adjusted for tasks/safety   low bed   fall reduction program maintained   nonskid shoes/slippers when out of bed  Intervention: Prevent and Manage VTE (Venous Thromboembolism) Risk  Recent Flowsheet Documentation  Taken 03/16/2021 0945 by Georgia Duff, RN  Activity Management: up ad lib  Goal: Optimal Comfort and Wellbeing  Outcome: Ongoing - Unchanged  Goal: Readiness for Transition of Care  Outcome: Ongoing - Unchanged  Goal: Rounds/Family Conference  Outcome: Ongoing - Unchanged     Problem: Infection  Goal: Absence of Infection Signs and Symptoms  Outcome: Ongoing - Unchanged     Problem: Fall Injury Risk  Goal: Absence of Fall and Fall-Related Injury  Outcome: Ongoing - Unchanged  Intervention: Promote Injury-Free Environment  Recent Flowsheet Documentation  Taken 03/16/2021 0945 by Georgia Duff, RN  Safety Interventions:   lighting adjusted for tasks/safety   low bed   fall reduction program maintained   nonskid shoes/slippers when out of bed

## 2021-03-17 NOTE — Unmapped (Addendum)
Gastroenterology (Luminal) Consult Service   Progress Note         Assessment & Plan:   Gary Baird??is a 81 y.o.??male??with PMHx of CMML??converted to AML??(on aza/ven), CAD, HTN, hx recurrent GIB who presents with??one day of??BRBPR.  ??  Patient is presented with lower GI bleeding. Patient previously with multiple EGDs and colonoscopies which have not identified a source of bleeding. 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 Capsule did not go through entire small bowel and colon. Patient then underwent single balloon enteroscopy 1/12. Patient with normal esophagus, stomach, duodenum. Jejunum normal, did not see AVMs. Placed tattoo at furthest extent reached. Patient has remained HDS, Hgb 8.4 this morning. Had multiple bowel movement without blood or melena.     Patient with multiple AVMs seen on VCE both in November and again this admission. Can refer to [facility] as outpatient for evaluation as they have the capability to double balloon enteroscopy as well as retrograde enteroscopy which allows for evaluation of the small bowel, however no current target on most recent VCE thus currently defer this option. Additionally, hematology could consider medical management with bevacizumab. Or can consider octreotide, however, this can be difficult to obtain in the outpatient setting.     Intravenous Bevacizumab Reduces Transfusion Requirements and Endoscopic Interventions in Patients With Gastric Antral Vascular Ectasia and Small Bowel Angioectasia ([email or web address]   ??  Recommendations  - Resume previous diet today.   - Continue present medications.   - Monitor Hb and stools for blood.  - Plan for outpatient repeat VCE (order placed)  - Can consider starting medical therapy with octreotide vs VEGF   - Will arrange follow up in GI clinic as outpatient    Thank you for involving [place] in the care of your patient. We will continue to follow along with you. This patient was seen and examined with Dr. Jacqulyn Bath.     For questions, please contact the on-call fellow for the Gastroenterology (Luminal) Consult Service at [phone].     Interval History:   No acute events overnight. No further melena, hematochezia.    ROS: Denies headache, chest pain, shortness of breath, abdominal pain, nausea, vomiting.    Objective:   Temp:  [35.6 ??C (96.1 ??F)-36.6 ??C (97.9 ??F)] 36.4 ??C (97.6 ??F)  Heart Rate:  [89-100] 89  SpO2 Pulse:  [89-98] 89  Resp:  [16-28] 23  BP: (83-145)/(34-84) 126/72  SpO2:  [90 %-100 %] 99 %    Gen: NAD, answers questions appropriately  Eyes: Sclera anicteric  HENT: Atraumatic, normocephalic, MMM  Heart: Regular rate  Lungs: no use of accessory muscles  Abdomen: soft, NTND, no rebound/guarding  Extremities: No edema in the BLEs  Neuro: Normal speech. No focal deficits.  Skin:  No rashes, lesions on clothed exam  Psych: Alert, normal mood and affect.     Labs/Studies: Labs and Studies from the last 24hrs per EMR and Reviewed

## 2021-03-18 DIAGNOSIS — K922 Gastrointestinal hemorrhage, unspecified: Secondary | ICD-10-CM | POA: Diagnosis not present

## 2021-03-18 LAB — CBC W/ AUTO DIFF
BASOPHILS ABSOLUTE COUNT: 0.1 10*9/L (ref 0.0–0.1)
BASOPHILS ABSOLUTE COUNT: 0.2 10*9/L — ABNORMAL HIGH (ref 0.0–0.1)
BASOPHILS RELATIVE PERCENT: 0.7 %
BASOPHILS RELATIVE PERCENT: 1.4 %
EOSINOPHILS ABSOLUTE COUNT: 0 10*9/L (ref 0.0–0.5)
EOSINOPHILS ABSOLUTE COUNT: 0 10*9/L (ref 0.0–0.5)
EOSINOPHILS RELATIVE PERCENT: 0.1 %
EOSINOPHILS RELATIVE PERCENT: 0.4 %
HEMATOCRIT: 22.6 % — ABNORMAL LOW (ref 39.0–48.0)
HEMATOCRIT: 21.6 % — ABNORMAL LOW (ref 39.0–48.0)
HEMOGLOBIN: 7.4 g/dL — ABNORMAL LOW (ref 12.9–16.5)
HEMOGLOBIN: 7.2 g/dL — ABNORMAL LOW (ref 12.9–16.5)
LYMPHOCYTES ABSOLUTE COUNT: 0.9 10*9/L — ABNORMAL LOW (ref 1.1–3.6)
LYMPHOCYTES ABSOLUTE COUNT: 1 10*9/L — ABNORMAL LOW (ref 1.1–3.6)
LYMPHOCYTES RELATIVE PERCENT: 6.3 %
LYMPHOCYTES RELATIVE PERCENT: 8.3 %
MEAN CORPUSCULAR HEMOGLOBIN CONC: 32.8 g/dL (ref 32.0–36.0)
MEAN CORPUSCULAR HEMOGLOBIN CONC: 33.4 g/dL (ref 32.0–36.0)
MEAN CORPUSCULAR HEMOGLOBIN: 26.4 pg (ref 25.9–32.4)
MEAN CORPUSCULAR HEMOGLOBIN: 26.6 pg (ref 25.9–32.4)
MEAN CORPUSCULAR VOLUME: 80.7 fL (ref 77.6–95.7)
MEAN CORPUSCULAR VOLUME: 79.8 fL (ref 77.6–95.7)
MEAN PLATELET VOLUME: 7.1 fL (ref 6.8–10.7)
MEAN PLATELET VOLUME: 7.3 fL (ref 6.8–10.7)
MONOCYTES ABSOLUTE COUNT: 4.3 10*9/L — ABNORMAL HIGH (ref 0.3–0.8)
MONOCYTES ABSOLUTE COUNT: 3.3 10*9/L — ABNORMAL HIGH (ref 0.3–0.8)
MONOCYTES RELATIVE PERCENT: 29.3 %
MONOCYTES RELATIVE PERCENT: 27 %
NEUTROPHILS ABSOLUTE COUNT: 9.3 10*9/L — ABNORMAL HIGH (ref 1.8–7.8)
NEUTROPHILS ABSOLUTE COUNT: 7.6 10*9/L (ref 1.8–7.8)
NEUTROPHILS RELATIVE PERCENT: 63.6 %
NEUTROPHILS RELATIVE PERCENT: 62.9 %
PLATELET COUNT: 693 10*9/L — ABNORMAL HIGH (ref 150–450)
PLATELET COUNT: 659 10*9/L — ABNORMAL HIGH (ref 150–450)
RED BLOOD CELL COUNT: 2.8 10*12/L — ABNORMAL LOW (ref 4.26–5.60)
RED BLOOD CELL COUNT: 2.71 10*12/L — ABNORMAL LOW (ref 4.26–5.60)
RED CELL DISTRIBUTION WIDTH: 21.9 % — ABNORMAL HIGH (ref 12.2–15.2)
RED CELL DISTRIBUTION WIDTH: 22.5 % — ABNORMAL HIGH (ref 12.2–15.2)
WBC ADJUSTED: 14.7 10*9/L — ABNORMAL HIGH (ref 3.6–11.2)
WBC ADJUSTED: 12.1 10*9/L — ABNORMAL HIGH (ref 3.6–11.2)

## 2021-03-18 LAB — BASIC METABOLIC PANEL
ANION GAP: 11 mmol/L (ref 5–14)
BLOOD UREA NITROGEN: 6 mg/dL — ABNORMAL LOW (ref 9–23)
BUN / CREAT RATIO: 7
CALCIUM: 8 mg/dL — ABNORMAL LOW (ref 8.7–10.4)
CHLORIDE: 102 mmol/L (ref 98–107)
CO2: 27 mmol/L (ref 20.0–31.0)
CREATININE: 0.88 mg/dL
EGFR CKD-EPI (2021) MALE: 86 mL/min/{1.73_m2} (ref >=60)
GLUCOSE RANDOM: 125 mg/dL (ref 70–179)
POTASSIUM: 3.4 mmol/L (ref 3.4–4.8)
SODIUM: 140 mmol/L (ref 135–145)

## 2021-03-18 LAB — SLIDE REVIEW

## 2021-03-18 LAB — MAGNESIUM: MAGNESIUM: 1.5 mg/dL — ABNORMAL LOW (ref 1.6–2.6)

## 2021-03-18 LAB — PHOSPHORUS: PHOSPHORUS: 2.5 mg/dL (ref 2.4–5.1)

## 2021-03-18 MED ADMIN — iohexoL (OMNIPAQUE) 350 mg iodine/mL solution 75 mL: 75 mL | INTRAVENOUS | @ 07:00:00 | Stop: 2021-03-18

## 2021-03-18 MED ADMIN — piperacillin-tazobactam (ZOSYN) IVPB (premix) 4.5 g: 4.5 g | INTRAVENOUS | @ 22:00:00 | Stop: 2021-03-22

## 2021-03-18 MED ADMIN — lactated ringers bolus 500 mL: 500 mL | INTRAVENOUS | @ 02:00:00 | Stop: 2021-03-17

## 2021-03-18 MED ADMIN — piperacillin-tazobactam (ZOSYN) IVPB (premix) 4.5 g: 4.5 g | INTRAVENOUS | @ 11:00:00 | Stop: 2021-03-22

## 2021-03-18 MED ADMIN — piperacillin-tazobactam (ZOSYN) IVPB (premix) 4.5 g: 4.5 g | INTRAVENOUS | @ 04:00:00 | Stop: 2021-03-22

## 2021-03-18 MED ADMIN — magnesium oxide (MAG-OX) tablet 400 mg: 400 mg | ORAL | @ 02:00:00 | Stop: 2021-03-17

## 2021-03-18 MED ADMIN — valACYclovir (VALTREX) tablet 500 mg: 500 mg | ORAL | @ 14:00:00

## 2021-03-18 MED ADMIN — pantoprazole (PROTONIX) EC tablet 40 mg: 40 mg | ORAL | @ 02:00:00

## 2021-03-18 MED ADMIN — piperacillin-tazobactam (ZOSYN) IVPB (premix) 4.5 g: 4.5 g | INTRAVENOUS | @ 17:00:00 | Stop: 2021-03-22

## 2021-03-18 MED ADMIN — magnesium oxide (MAG-OX) tablet 400 mg: 400 mg | ORAL | @ 14:00:00

## 2021-03-18 MED ADMIN — pantoprazole (PROTONIX) EC tablet 40 mg: 40 mg | ORAL | @ 14:00:00

## 2021-03-18 MED ADMIN — lactated Ringers infusion: 100 mL/h | INTRAVENOUS | @ 04:00:00

## 2021-03-18 MED ADMIN — lactated Ringers infusion: 100 mL/h | INTRAVENOUS | @ 11:00:00 | Stop: 2021-03-18

## 2021-03-18 NOTE — Unmapped (Signed)
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 *      Hypoxia- tremors- fever  Approximately 5 PM on 03/17/2021 patient developed tremulousness.  Family relates that he felt overwhelmed however discontinued for 10 to 20 minutes.  He was given 1 dose of Ativan did not relieve his tremulousness.  At that time his other vital signs were stable.  At approximately 530 patient developed fever as well as new oxygen requirement.  Patient has had increased lower extremity swelling over the last 2 days however most recent echo showed good heart functionshould be able to tolerate additional fluids he is received.  Differential includes volume overload, hospital-acquired infection, postoperative complications.  CTA negative, however representing primary bronchovascular groundglass nodules and patchy consolidations which could be representative of infection versus aspiration.  White count today increased to 14.7 with an ANC of 9.3.  Less likely but could represent relapsing, as such we will monitor closely.  Patient has been afebrile since that time with stable blood pressures.  Patient weaned from 2 L low flow nasal cannula to room air well.  - Follow-up blood cultures x2,   -Continue Zosyn  -Recheck CBC this afternoon      Recurrent GI bleed; hx hemorrhoids:   Per chart review, patient was recently admitted for hemorrhagic shock 01/29/2021-02/07/2021 where he was found to have transformation of his CMML to acute leukemia. During his admission, he was noted to have history of frequent bright red bowel movements with a 9 point drop in Hb over 1 month. He has known history of gastric ulcers however he is on a daily PPI. CT A/P 11/26 was negative for bleed as well as prior colonoscopies.  Ended up receiving upper endoscopy, colonoscopy and capsule endoscopy only significant for angiectasia's within the small bowel, likely the source of his recurrent bleeding. Current presentation similar to prior episodes.    Capsule endoscopy showed AVM however on planned balloon enteroscopy unable to visualize AVM.  Plan for outpatient follow-up upon discharge.  -Continue home PPI, Protonix 40 mg twice daily  - Maintain type and screen  - Transfuse for hemoglobin less than 7, PLT<10, received 1 unit of blood on 03/16/2021 in preparation for discharge (hemoglobin 7.6)  - Follow up GIPP  ??  Thrombocytosis:   Pt with extreme/acute thrombocytosis, PLT>2000 downtrending, previously 100s in December. Potentially reactive thrombocytosis given iron deficiency anemia/blood loss. Can also be due to infection - Viral, bacterial, but less likely given lack of clear infectious source/afebrile.  Other etiologies also include reaction to chemotherapy affecting his myeloid progenitor cells, however this would also be extraordinarily rare and not usually associated with his chemotherapy.  Significant thrombocytosis can be associated with increased risk of bleeding due to acquired von Willebrand Syndrome.  Von Willebrand panel normal.  - Daily CBC/Diff  - Hold ASA while testing for AVWS  ??  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 On monthly azacitidine since 09/2018 for CMML, stopped 12/30/20. Patient developed GI bleeding and  worsening leukocytosis and was admitted to [facility] for management on 01/29/21. BMBx identified??progression to??AML. Started Aza/Ven 02/14/2021. S/p C1 Aza/Ven??with BMBx revealing morphologic remission. Recent bone marrow biopsy results, showed <1% blasts, revealing CR. Due for cycle 2 Aza/Ven on 1/9. Per 1/5 note from Dr. Vertell Limber, if [facility] >=1,000, patient will require a 1-week delay for his next cycle of treatment. Plan is to coordinate future treatment cycles to be done locally??with [name] at [facility].  - Continue Valtrex 500 mg daily  - Compazine 5 mg daily q6h PRN  ??  Chronic Problems:  Iron deficiency anemia:  Normocytic anemia:  Iron studies revealed iron deficiency anemia. He was started on ferrous sulfate last visit.   Discontinue ferrous sulfate supplementation secondary to receiving blood further.  Consider starting on discharge if not transfusion dependent.  GERD:  - pantoprazole 40 mg BID  Hypertension:  -Home HCTZ  Hypokalemia: Replete PRN  Other/chronic:  - Melatonin 9 mg nightly PRN    Daily Checklist:  Diet: Regular Diet  DVT PPx: Contraindicated - High Risk for Bleeding/Active Bleeding  Electrolytes: Potassium Repleted  Code Status: Full Code    Team Contact Information:   Primary Team: Hematology Resident (MEDE)  Primary Resident: Nelly Laurence, MD  Resident's Pager: [phone] (Hematology Intern - Blue)    Interval History:   Patient remained afebrile overnight with stable blood pressures.  Was able to be weaned from oxygen.  Feels significantly better this morning states like he feels like his usual self at this time.    ROS: Denies headache, chest pain, shortness of breath, abdominal pain    Objective:     Temp:  [35.7 ??C (96.3 ??F)-38.9 ??C (102 ??F)] 36.7 ??C (98.1 ??F)  Heart Rate:  [96-139] 107  Resp:  [16-24] 20  BP: (86-163)/(42-76) 106/53  SpO2:  [92 %-100 %] 98 %,     Intake/Output Summary (Last 24 hours) at 03/18/2021 1417  Last data filed at 03/18/2021 1124  Gross per 24 hour   Intake 520 ml   Output 3700 ml   Net -3180 ml       Gen: Well-appearing in no acute distress.  Alert and oriented x3.  Eyes: Sclera anicteric, EOMI, PERRLA,  HENT: atraumatic, normocephalic, MMM. OP w/o erythema or exudate   Neck: no cervical lymphadenopathy or thyromegaly, no JVD  Heart: RRR, S1, S2, no M/R/G, no chest wall tenderness  Lungs: CTAB, no crackles or wheezes, no use of accessory muscles  Abdomen: Normoactive bowel sounds, soft, NTND, no rebound/guarding, no hepatosplenomegaly  Extremities: no clubbing, cyanosis, or edema: pulses are +2 in bilateral upper and lower extremities  Neuro: CN II-XI grossly intact, normal cerebellar function, normal gait. No focal deficits.  Skin:  No rashes, lesions on clothed exam  Psych: Alert, oriented, normal mood and affect.     Labs/Studies: Labs and Studies from the last 24hrs per EMR and Reviewed    Corene Cornea, MD  PGY1 Internal Medicine - Pediatrics

## 2021-03-18 NOTE — Unmapped (Signed)
Pt completed balloon enteroscopy today, anticipated discharge. After returning, pt was endorsing chills, felt anxious, had nausea, BP elevated and was tachycardic, MD and GI team at bedside, glucose stable, Compazine and Ativan provided. At 1600, pt became more tachycardic and required O2 via [place], EKG and chest x-ray completed, one time Lasix provided, pt having urinary urgency, condom cath placed. At 1645, pt became febrile, blood cultures drawn, repeat CBC and CMP drawn, Tylenol given, Zosyn ordered, tele ordered, discharge canceled. Pt also became hypotensive toward end of shift, 1000 L bolus initiated, showing improvement with bolus, also now afebrile, see vitals at 1927. Denying pain. Still outstanding for urinalysis and urine culture. Family visited. Currently resting.    Vitals:    03/17/21 1835 03/17/21 1918 03/17/21 1919 03/17/21 1927   BP: 86/42 92/44 91/47  107/51   Pulse: 122   116   Resp: 16   18   Temp: (!) 38.5 ??C (101.3 ??F)   37.9 ??C (100.2 ??F)   TempSrc: Oral   Oral   SpO2: 98%   97%   Weight:       Height:            Problem: Adult Inpatient Plan of Care  Goal: Patient-Specific Goal (Individualized)  Outcome: Ongoing - Unchanged     Problem: Adult Inpatient Plan of Care  Goal: Optimal Comfort and Wellbeing  Outcome: Ongoing - Unchanged

## 2021-03-18 NOTE — Unmapped (Signed)
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 *      Hypoxia- tremors- fever  Approximately 5 PM on 03/17/2021 patient developed tremulousness.  Family relates that he felt overwhelmed however discontinued for 10 to 20 minutes.  He was given 1 dose of Ativan did not relieve his tremulousness.  At that time his other vital signs were stable.  At approximately 530 patient developed fever as well as new oxygen requirement.  Patient has had increased lower extremity swelling over the last 2 days however most recent echo showed good heart functionshould be able to tolerate additional fluids he is received.  Differential includes volume overload, hospital-acquired infection, postoperative complications versus pulmonary embolism given tachycardia, new O2 requirement and platelet dysfunction.  - Follow-up CBC, CMP  - Blood cultures x2, begin Zosyn  - Chest x-ray  - IV Lasix 40 mg  - CTA, if O2 requirement unable to be weaned with IV Lasix      Recurrent GI bleed; hx hemorrhoids:   Per chart review, patient was recently admitted for hemorrhagic shock 01/29/2021-02/07/2021 where he was found to have transformation of his CMML to acute leukemia. During his admission, he was noted to have history of frequent bright red bowel movements with a 9 point drop in Hb over 1 month. He has known history of gastric ulcers however he is on a daily PPI. CT A/P 11/26 was negative for bleed as well as prior colonoscopies.  Ended up receiving upper endoscopy, colonoscopy and capsule endoscopy only significant for angiectasia's within the small bowel, likely the source of his recurrent bleeding. Current presentation similar to prior episodes.    Capsule endoscopy showed AVM however on planned balloon enteroscopy unable to visualize AVM.  Plan for outpatient follow-up upon discharge.  -Continue home PPI, Protonix 40 mg twice daily  - Maintain type and screen  - Transfuse for hemoglobin less than 7, PLT<10, received 1 unit of blood on 03/16/2021 in preparation for discharge (hemoglobin 7.6)  - Follow up GIPP  ??  Thrombocytosis:   Pt with extreme/acute thrombocytosis, PLT>2000 downtrending, previously 100s in December. Potentially reactive thrombocytosis given iron deficiency anemia/blood loss. Can also be due to infection - Viral, bacterial, but less likely given lack of clear infectious source/afebrile.  Other etiologies also include reaction to chemotherapy affecting his myeloid progenitor cells, however this would also be extraordinarily rare and not usually associated with his chemotherapy.  Significant thrombocytosis can be associated with increased risk of bleeding due to acquired von Willebrand Syndrome.  Von Willebrand panel normal.  - Daily CBC/Diff  - Hold ASA while testing for AVWS  ??  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 On monthly azacitidine since 09/2018 for CMML, stopped 12/30/20. Patient developed GI bleeding and worsening leukocytosis and was admitted to [facility] for management on 01/29/21. BMBx identified??progression to??AML. Started Aza/Ven 02/14/2021. S/p C1 Aza/Ven??with BMBx revealing morphologic remission. Recent bone marrow biopsy results, showed <1% blasts, revealing CR. Due for cycle 2 Aza/Ven on  1/9. Per 1/5 note from Dr. Vertell Limber, if ANC >=1,000, patient will require a 1-week delay for his next cycle of treatment. Plan is to coordinate future treatment cycles to be done locally??with [name] at [facility].  - Continue Valtrex 500 mg daily  - Compazine 5 mg daily q6h PRN  ??  Chronic Problems:  Iron deficiency anemia:  Normocytic anemia:  Iron studies revealed iron deficiency anemia. He was started on ferrous sulfate last visit.   Discontinue ferrous sulfate supplementation secondary to receiving blood further.  Consider starting on discharge if not transfusion dependent.  GERD:  - pantoprazole 40 mg BID  Hypertension:  -Restart home HCTZ  Hypokalemia: Replete PRN  Other/chronic:  - Melatonin 9 mg nightly PRN        Daily Checklist:  Diet: Regular Diet  DVT PPx: Contraindicated - High Risk for Bleeding/Active Bleeding  Electrolytes: Potassium Repleted  Code Status: Full Code    Team Contact Information:   Primary Team: Hematology Resident (MEDE)  Primary Resident: Nelly Laurence, MD  Resident's Pager: [phone] (Hematology Intern - Blue)    Interval History:   Tolerated balloon enteroscopy well.  However shortly after returning from procedure developed tremors, fever, new O2 requirement.  Decision was made to start on broad dose antibiotics, get blood cultures chest x-ray as well as start Lasix.  We will get CTA if O2 requirement persists.    ROS: Denies headache, chest pain, shortness of breath, abdominal pain    Objective:     Temp:  [35.6 ??C (96.1 ??F)-38.9 ??C (102 ??F)] 38.9 ??C (102 ??F)  Heart Rate:  [89-139] 130  SpO2 Pulse:  [89-98] 89  Resp:  [16-28] 20  BP: (83-163)/(34-84) 136/58  SpO2:  [90 %-100 %] 92 %,     Intake/Output Summary (Last 24 hours) at 03/17/2021 1746  Last data filed at 03/17/2021 1651  Gross per 24 hour   Intake 854 ml   Output 375 ml   Net 479 ml       Gen: Well-appearing in no acute distress.  Alert and oriented x3.  Eyes: Sclera anicteric, EOMI, PERRLA,  HENT: atraumatic, normocephalic, MMM. OP w/o erythema or exudate   Neck: no cervical lymphadenopathy or thyromegaly, no JVD  Heart: RRR, S1, S2, no M/R/G, no chest wall tenderness  Lungs: CTAB, no crackles or wheezes, no use of accessory muscles  Abdomen: Normoactive bowel sounds, soft, NTND, no rebound/guarding, no hepatosplenomegaly  Extremities: no clubbing, cyanosis, or edema: pulses are +2 in bilateral upper and lower extremities  Neuro: CN II-XI grossly intact, normal cerebellar function, normal gait. No focal deficits.  Skin:  No rashes, lesions on clothed exam  Psych: Alert, oriented, normal mood and affect.     Labs/Studies: Labs and Studies from the last 24hrs per EMR and Reviewed    Corene Cornea, MD  PGY1 Internal Medicine - Pediatrics

## 2021-03-18 NOTE — Unmapped (Signed)
Pt remains drowsy at the beginning of the shift, BP labile, hypotensive, 1L NS bolus given, BP still soft, provider informed, 500cc bolus LR given, LR cont. Fluid 100cc/hr, BP stable after interventions, urine sample send; pt has CTA with contrast; 3L 02 weaned to RA, pt is now alert and oriented x4; has one small bloody stool this morning, provider informed, waiting for labs; daughter called, updated; has good appetite after waking up; safety precaution in place; will CTM.    Problem: Adult Inpatient Plan of Care  Goal: Plan of Care Review  Outcome: Progressing  Goal: Patient-Specific Goal (Individualized)  Outcome: Progressing  Goal: Absence of Hospital-Acquired Illness or Injury  Outcome: Progressing  Intervention: Identify and Manage Fall Risk  Recent Flowsheet Documentation  Taken 03/17/2021 2000 by Benay Pike, RN  Safety Interventions:   bed alarm   fall reduction program maintained   family at bedside   low bed  Intervention: Prevent and Manage VTE (Venous Thromboembolism) Risk  Recent Flowsheet Documentation  Taken 03/18/2021 0400 by Benay Pike, RN  Activity Management:   activity adjusted per tolerance   bedrest  Taken 03/18/2021 0200 by Benay Pike, RN  Activity Management:   activity adjusted per tolerance   bedrest  Taken 03/17/2021 2200 by Benay Pike, RN  Activity Management:   activity adjusted per tolerance   bedrest  Taken 03/17/2021 2000 by Benay Pike, RN  Activity Management:   activity adjusted per tolerance   bedrest  Goal: Optimal Comfort and Wellbeing  Outcome: Progressing  Goal: Readiness for Transition of Care  Outcome: Progressing  Goal: Rounds/Family Conference  Outcome: Progressing     Problem: Infection  Goal: Absence of Infection Signs and Symptoms  Outcome: Progressing  Intervention: Prevent or Manage Infection  Recent Flowsheet Documentation  Taken 03/17/2021 2000 by Benay Pike, RN  Isolation Precautions: protective precautions maintained     Problem: Fall Injury Risk  Goal: Absence of Fall and Fall-Related Injury  Outcome: Progressing  Intervention: Promote Injury-Free Environment  Recent Flowsheet Documentation  Taken 03/17/2021 2000 by Benay Pike, RN  Safety Interventions:   bed alarm   fall reduction program maintained   family at bedside   low bed     Problem: Fall Injury Risk  Goal: Absence of Fall and Fall-Related Injury  Outcome: Progressing  Intervention: Promote Injury-Free Environment  Recent Flowsheet Documentation  Taken 03/17/2021 2000 by Benay Pike, RN  Safety Interventions:   bed alarm   fall reduction program maintained   family at bedside   low bed     Problem: Self-Care Deficit  Goal: Improved Ability to Complete Activities of Daily Living  Outcome: Progressing

## 2021-03-19 LAB — CBC W/ AUTO DIFF
BASOPHILS ABSOLUTE COUNT: 0.2 10*9/L — ABNORMAL HIGH (ref 0.0–0.1)
BASOPHILS RELATIVE PERCENT: 2.6 %
EOSINOPHILS ABSOLUTE COUNT: 0 10*9/L (ref 0.0–0.5)
EOSINOPHILS RELATIVE PERCENT: 0.7 %
HEMATOCRIT: 20.6 % — ABNORMAL LOW (ref 39.0–48.0)
HEMOGLOBIN: 6.8 g/dL — ABNORMAL LOW (ref 12.9–16.5)
LYMPHOCYTES ABSOLUTE COUNT: 1.1 10*9/L (ref 1.1–3.6)
LYMPHOCYTES RELATIVE PERCENT: 17.2 %
MEAN CORPUSCULAR HEMOGLOBIN CONC: 33.1 g/dL (ref 32.0–36.0)
MEAN CORPUSCULAR HEMOGLOBIN: 26.4 pg (ref 25.9–32.4)
MEAN CORPUSCULAR VOLUME: 79.6 fL (ref 77.6–95.7)
MEAN PLATELET VOLUME: 7.4 fL (ref 6.8–10.7)
MONOCYTES ABSOLUTE COUNT: 1.1 10*9/L — ABNORMAL HIGH (ref 0.3–0.8)
MONOCYTES RELATIVE PERCENT: 18 %
NEUTROPHILS ABSOLUTE COUNT: 3.8 10*9/L (ref 1.8–7.8)
NEUTROPHILS RELATIVE PERCENT: 61.5 %
PLATELET COUNT: 556 10*9/L — ABNORMAL HIGH (ref 150–450)
RED BLOOD CELL COUNT: 2.59 10*12/L — ABNORMAL LOW (ref 4.26–5.60)
RED CELL DISTRIBUTION WIDTH: 22.9 % — ABNORMAL HIGH (ref 12.2–15.2)
WBC ADJUSTED: 6.1 10*9/L (ref 3.6–11.2)

## 2021-03-19 LAB — HEPATIC FUNCTION PANEL
ALBUMIN: 2.4 g/dL — ABNORMAL LOW (ref 3.4–5.0)
ALKALINE PHOSPHATASE: 100 U/L (ref 46–116)
ALT (SGPT): 7 U/L — ABNORMAL LOW (ref 10–49)
AST (SGOT): 10 U/L (ref <=34)
BILIRUBIN DIRECT: 0.2 mg/dL (ref 0.00–0.30)
BILIRUBIN TOTAL: 0.4 mg/dL (ref 0.3–1.2)
PROTEIN TOTAL: 5.4 g/dL — ABNORMAL LOW (ref 5.7–8.2)

## 2021-03-19 LAB — SLIDE REVIEW

## 2021-03-19 LAB — BASIC METABOLIC PANEL
ANION GAP: 7 mmol/L (ref 5–14)
BLOOD UREA NITROGEN: 6 mg/dL — ABNORMAL LOW (ref 9–23)
BUN / CREAT RATIO: 7
CALCIUM: 8.2 mg/dL — ABNORMAL LOW (ref 8.7–10.4)
CHLORIDE: 103 mmol/L (ref 98–107)
CO2: 30 mmol/L (ref 20.0–31.0)
CREATININE: 0.82 mg/dL
EGFR CKD-EPI (2021) MALE: 88 mL/min/{1.73_m2} (ref >=60)
GLUCOSE RANDOM: 112 mg/dL (ref 70–179)
POTASSIUM: 3 mmol/L — ABNORMAL LOW (ref 3.4–4.8)
SODIUM: 140 mmol/L (ref 135–145)

## 2021-03-19 LAB — MAGNESIUM: MAGNESIUM: 1.8 mg/dL (ref 1.6–2.6)

## 2021-03-19 LAB — PHOSPHORUS: PHOSPHORUS: 2.7 mg/dL (ref 2.4–5.1)

## 2021-03-19 MED ORDER — CIPROFLOXACIN 500 MG TABLET
ORAL_TABLET | Freq: Two times a day (BID) | ORAL | 0 refills | 5 days
Start: 2021-03-19 — End: 2021-03-24

## 2021-03-19 MED ORDER — TRANEXAMIC ACID 650 MG TABLET
ORAL_TABLET | Freq: Three times a day (TID) | ORAL | 0 refills | 30 days
Start: 2021-03-19 — End: 2021-04-18

## 2021-03-19 MED ORDER — METRONIDAZOLE 500 MG TABLET
ORAL_TABLET | Freq: Three times a day (TID) | ORAL | 0 refills | 5 days
Start: 2021-03-19 — End: 2021-03-24

## 2021-03-19 MED ADMIN — ciprofloxacin HCl (CIPRO) tablet 500 mg: 500 mg | ORAL | @ 17:00:00 | Stop: 2021-03-24

## 2021-03-19 MED ADMIN — valACYclovir (VALTREX) tablet 500 mg: 500 mg | ORAL | @ 13:00:00

## 2021-03-19 MED ADMIN — potassium chloride CR tablet 40 mEq: 40 meq | ORAL | @ 19:00:00 | Stop: 2021-03-19

## 2021-03-19 MED ADMIN — tranexamic acid tablet 1,300 mg: 1300 mg | ORAL | @ 19:00:00

## 2021-03-19 MED ADMIN — magnesium oxide (MAG-OX) tablet 400 mg: 400 mg | ORAL | @ 13:00:00

## 2021-03-19 MED ADMIN — pantoprazole (PROTONIX) EC tablet 40 mg: 40 mg | ORAL | @ 02:00:00

## 2021-03-19 MED ADMIN — metroNIDAZOLE (FLAGYL) tablet 500 mg: 500 mg | ORAL | @ 19:00:00 | Stop: 2021-03-24

## 2021-03-19 MED ADMIN — pantoprazole (PROTONIX) EC tablet 40 mg: 40 mg | ORAL | @ 13:00:00

## 2021-03-19 MED ADMIN — piperacillin-tazobactam (ZOSYN) IVPB (premix) 4.5 g: 4.5 g | INTRAVENOUS | @ 11:00:00 | Stop: 2021-03-19

## 2021-03-19 MED ADMIN — piperacillin-tazobactam (ZOSYN) IVPB (premix) 4.5 g: 4.5 g | INTRAVENOUS | @ 05:00:00 | Stop: 2021-03-19

## 2021-03-19 MED ADMIN — hydroCHLOROthiazide (HYDRODIURIL) tablet 12.5 mg: 12.5 mg | ORAL | @ 13:00:00

## 2021-03-19 NOTE — Unmapped (Signed)
Pt continuing to have some blood in stool. Hemoglobin/hematocrit monitored. Tachycardic but other VSS. PT assessed and cleared patient. IV zosyn continued. LR discontinued. Tele monitor on with no calls. No complaints of pain or discomfort. Pt reporting no difficulty breathing throughout shift.     Problem: Adult Inpatient Plan of Care  Goal: Plan of Care Review  Outcome: Progressing  Goal: Patient-Specific Goal (Individualized)  Outcome: Progressing  Goal: Absence of Hospital-Acquired Illness or Injury  Outcome: Progressing  Intervention: Identify and Manage Fall Risk  Recent Flowsheet Documentation  Taken 03/18/2021 1404 by Vaughan Browner, RN  Safety Interventions: family at bedside  Taken 03/18/2021 1233 by Vaughan Browner, RN  Safety Interventions: family at bedside  Taken 03/18/2021 1020 by Vaughan Browner, RN  Safety Interventions: bed alarm  Taken 03/18/2021 0919 by Vaughan Browner, RN  Safety Interventions:   bed alarm   environmental modification   fall reduction program maintained  Intervention: Prevent Skin Injury  Recent Flowsheet Documentation  Taken 03/18/2021 0919 by Vaughan Browner, RN  Skin Protection: adhesive use limited  Intervention: Prevent and Manage VTE (Venous Thromboembolism) Risk  Recent Flowsheet Documentation  Taken 03/18/2021 0919 by Vaughan Browner, RN  Activity Management: activity adjusted per tolerance  Goal: Optimal Comfort and Wellbeing  Outcome: Progressing  Goal: Readiness for Transition of Care  Outcome: Progressing  Goal: Rounds/Family Conference  Outcome: Progressing

## 2021-03-19 NOTE — Unmapped (Signed)
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 *      Hypoxia- tremors- fever  Approximately 5 PM on 03/17/2021 patient developed tremulousness.  Family relates that he felt overwhelmed however discontinued for 10 to 20 minutes.  He was given 1 dose of Ativan did not relieve his tremulousness.  At that time his other vital signs were stable.  At approximately 530 patient developed fever as well as new oxygen requirement.  Patient has had increased lower extremity swelling over the last 2 days however most recent echo showed good heart functionshould be able to tolerate additional fluids he is received.  Differential includes volume overload, hospital-acquired infection, postoperative complications.  CTA negative, however representing primary bronchovascular groundglass nodules and patchy consolidations which could be representative of infection versus aspiration.  White count today increased to 14.7 with an ANC of 9.3.  Less likely but could represent relapsing, as such we will monitor closely.  Patient has been afebrile since that time with stable blood pressures.  Patient weaned from 2 L low flow nasal cannula to room air well.  Blood cultures no growth to date  - Follow-up blood cultures x2,   -Transition to ciprofloxacin plus Flagyl, plan to discharge home with 5-day course  -Recheck CBC this afternoon      Recurrent GI bleed; hx hemorrhoids:   Per chart review, patient was recently admitted for hemorrhagic shock 01/29/2021-02/07/2021 where he was found to have transformation of his CMML to acute leukemia. During his admission, he was noted to have history of frequent bright red bowel movements with a 9 point drop in Hb over 1 month. He has known history of gastric ulcers however he is on a daily PPI. CT A/P 11/26 was negative for bleed as well as prior colonoscopies.  Ended up receiving upper endoscopy, colonoscopy and capsule endoscopy only significant for angiectasia's within the small bowel, likely the source of his recurrent bleeding. Current presentation similar to prior episodes.    Capsule endoscopy showed AVM however on planned balloon enteroscopy unable to visualize AVM.  Plan for outpatient follow-up upon discharge.  -Continue home PPI, Protonix 40 mg twice daily  -Add tranexamic acid 1300 mg, 3 times daily  - Maintain type and screen  - Transfuse for hemoglobin less than 7, PLT<10, received 1 unit of blood on 03/19/2021  - Follow up GIPP  ??  Thrombocytosis:   Pt with extreme/acute thrombocytosis, PLT>2000 downtrending, previously 100s in December. Potentially reactive thrombocytosis given iron deficiency anemia/blood loss. Can also be due to infection - Viral, bacterial, but less likely given lack of clear infectious source/afebrile.  Other etiologies also include reaction to chemotherapy affecting his myeloid progenitor cells, however this would also be extraordinarily rare and not usually associated with his chemotherapy.  Significant thrombocytosis can be associated with increased risk of bleeding due to acquired von Willebrand Syndrome.  Von Willebrand panel normal.  - Daily CBC/Diff  - Hold ASA while testing for AVWS  ??  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 On monthly azacitidine since 09/2018 for CMML, stopped 12/30/20. Patient developed GI bleeding and worsening leukocytosis and was admitted to [facility] for management on 01/29/21. BMBx identified??progression to??AML. Started Aza/Ven 02/14/2021. S/p C1 Aza/Ven??with BMBx revealing morphologic remission. Recent bone marrow biopsy results, showed <1% blasts, revealing CR. Due for cycle 2 Aza/Ven on 1/9. Per 1/5 note from Dr. Vertell Limber, if [facility] >=1,000, patient will require a 1-week delay for his next cycle of treatment. Plan is to coordinate future treatment cycles to be done locally??with [name] at [facility].  - Continue Valtrex 500 mg daily  - Compazine 5 mg daily q6h PRN  ??  Chronic Problems:  Iron deficiency anemia:  Normocytic anemia:  Iron studies revealed iron deficiency anemia. He was started on ferrous sulfate last visit.   Discontinue ferrous sulfate supplementation secondary to receiving blood further.  Consider starting on discharge if not transfusion dependent.  GERD:  - pantoprazole 40 mg BID  Hypertension:  -Home HCTZ  Hypokalemia: Replete PRN  Other/chronic:  - Melatonin 9 mg nightly PRN    Daily Checklist:  Diet: Regular Diet  DVT PPx: Contraindicated - High Risk for Bleeding/Active Bleeding  Electrolytes: Potassium Repleted  Code Status: Full Code    Team Contact Information:   Primary Team: Hematology Resident (MEDE)  Primary Resident: Nelly Laurence, MD  Resident's Pager: [phone] (Hematology Intern - Blue)    Interval History:   No acute events overnight.  Patient has once again developed bloody stools however he has no abdominal pain.    ROS: Denies headache, chest pain, shortness of breath, abdominal pain    Objective:     Temp:  [36.4 ??C (97.5 ??F)-37.3 ??C (99.1 ??F)] 36.4 ??C (97.5 ??F)  Heart Rate:  [93-105] 98  Resp:  [18-22] 18  BP: (101-120)/(54-70) 114/59  SpO2:  [97 %-100 %] 99 %,     Intake/Output Summary (Last 24 hours) at 03/19/2021 1349  Last data filed at 03/19/2021 4540  Gross per 24 hour   Intake 500 ml   Output 900 ml   Net -400 ml       Gen: Well-appearing in no acute distress.  Alert and oriented x3.  Eyes: Sclera anicteric, EOMI, PERRLA,  HENT: atraumatic, normocephalic, MMM. OP w/o erythema or exudate   Neck: no cervical lymphadenopathy or thyromegaly, no JVD  Heart: RRR, S1, S2, no M/R/G, no chest wall tenderness  Lungs: CTAB, no crackles or wheezes, no use of accessory muscles  Abdomen: Normoactive bowel sounds, soft, NTND, no rebound/guarding, no hepatosplenomegaly  Extremities: no clubbing, cyanosis, or edema: pulses are +2 in bilateral upper and lower extremities  Neuro: CN II-XI grossly intact, normal cerebellar function, normal gait. No focal deficits.  Skin:  No rashes, lesions on clothed exam  Psych: Alert, oriented, normal mood and affect.     Labs/Studies: Labs and Studies from the last 24hrs per EMR and Reviewed    Corene Cornea, MD  PGY1 Internal Medicine - Pediatrics

## 2021-03-19 NOTE — Unmapped (Shared)
Physician Discharge Summary [facility]  4 ONC [facility]  [address]  [place] [address]  Dept: [phone]  Loc: [phone]     Identifying Information:   Elva Breaker  [birth year]  [id number]    Primary Care Physician: [facility]     Referring Physician: Anastasio Auerbach Painschab     Code Status: Full Code    Admit Date: 03/12/2021    Discharge Date: 03/19/2021     Discharge To: Home    Discharge Service: [facility] - Hematology Res Floor Team (MEDE)     Discharge Attending Physician: Wynn Banker, MD    Discharge Diagnoses:  Principal Problem:    GI bleed  Active Problems:    CMML (chronic myelomonocytic leukemia) (CMS-HCC)    Anemia    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 *      Outpatient Provider Follow Up Issues:   Supportive Care Recommendations:  We recommend based on the patient???s underlying diagnosis and treatment history the following supportive care:    1. Antimicrobial prophylaxis:  AML (in remission, receiving post-remission therapy):  Viral: Valacyclovir 500mg  PO daily (Continuous)    2. Blood product support:  Leukoreduced blood products are required.  Irradiated blood products are preferred, but in case of urgent transfusion needs non-irradiated blood products may be used:     -  RBC transfusion threshold: transfuse 1 units for Hgb < 7 g/dL.    Based on the patient's disease status and intensity of therapy, complete blood count with differential should be evaluated 3 times per week and used to guide transfusion support    3. Hematopoietic growth factor support: none    Hospital Course:   Hospital course:  Mansel Strother is a 81 y.o. 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     Recurrent GI bleed; hx hemorrhoids:   Per chart review, patient was recently admitted for hemorrhagic shock 01/29/2021-02/07/2021 where he was found to have transformation of his CMML to acute leukemia. During his admission, he was noted to have history of frequent bright red bowel movements with a 9 point drop in Hb over 1 month. He has known history of gastric ulcers however he is on a daily PPI. CT A/P 11/26 was negative for bleed as well as prior colonoscopies.  Ended up receiving upper endoscopy, colonoscopy and capsule endoscopy only significant for angiectasia's within the small bowel, likely the source of his recurrent bleeding. Current presentation similar to prior episodes.    Capsule endoscopy showed AVM however on planned balloon enteroscopy unable to visualize AVM.  Plan for outpatient follow-up upon discharge.  Plan to continue home PPI.  Added tranexamic acid 1300 mg 3 times daily.  Patient was transfused throughout his hospital stay.  Plan for 2 times weekly follow-up for transfusions as needed.    Thrombocytosis:   C/f acquired Von Willebrand syndrome (AVWS) 2/2 thrombocytosis:  Pt with extreme/acute thrombocytosis, PLT>2000, previously 100s in December. Potentially reactive thrombocytosis given iron deficiency anemia/blood loss. Can also be due to infection - Viral, bacterial, but less likely given lack of clear infectious source/afebrile. Significant thrombocytosis can be associated with increased risk of bleeding due to acquired von Willebrand Syndrome (above).  1/3 von Willebrand panel not consistent with von Willebrand's disease repeat this admission also showed appropriate  function.  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 On monthly azacitidine since 09/2018 for CMML, stopped 12/30/20. Patient developed GI bleeding and worsening leukocytosis and was admitted to [facility] for management on 01/29/21. BMBx identified progression to AML. Started Aza/Ven 02/14/2021. S/p C1 Aza/Ven with BMBx revealing morphologic remission. Recent bone marrow biopsy results, showed <1% blasts, revealing CR. Due for cycle 2 Aza/Ven on 1/9. Per 1/5 note from Dr. Vertell Limber, if ANC </=1,000, patient will require a 1-week delay for his next cycle of treatment.  On discharge ANC greater than 1000.  Plan to arrange follow-up to plan is to startcycle 2 Aza/Ven on 03/17/2021.  Coordinate future treatment cycles to be done locally with [name] at [facility].    Hypoxia- tremors- fever  Approximately 5 PM on 03/17/2021 patient developed tremulousness.  Family relates that he felt overwhelmed however discontinued for 10 to 20 minutes.  He was given 1 dose of Ativan did not relieve his tremulousness.  At that time his other vital signs were stable.  At approximately 530 patient developed fever as well as new oxygen requirement.  Differential included volume overload, hospital-acquired infection, postoperative complications.  CTA negative, however representing primary bronchovascular groundglass nodules and patchy consolidations which could be representative of infection versus aspiration.  White count was increased to 14.7 with an ANC of 9.3.  Patient has been afebrile since that time with stable blood pressures.  Patient weaned from 2 L low flow nasal cannula to room air well.  Blood cultures no growth to date.  Follow blood cultures to 48 hours, no growth to date.  Transition from Zosyn to ciprofloxacin plus Flagyl plan to continue for 5 days upon discharge.  Most likely representative of transitory bacteremia from translocation across gut.       Iron deficiency anemia:  Normocytic anemia:  Iron studies revealed iron deficiency anemia. He was started on ferrous sulfate last visit. Started on Ferrous Sulfate supplementation at last clinic visit, however iron sulfate supplementation was stopped and discontinued during this admission as he is receiving regular blood transfusions.    GERD:  Continue pantoprazole 40 mg BID  Hypertension:  - Hold home hydrochlorothiazide 12.5 mg daily, restart on discharge  Hypokalemia: Repleted PRN  Other/chronic:  - Melatonin 9 mg nightly PRN                      Procedures:  EGD  No admission procedures for hospital encounter.  ______________________________________________________________________  Discharge Medications:     Your Medication List      STOP taking these medications    ferrous sulfate 325 (65 FE) MG tablet        CHANGE how you take these medications    pantoprazole 40 MG tablet  Commonly known as: PROTONIX  Take 1 tablet (40 mg total) by mouth Two (2) times a day.  What changed: when to take this        CONTINUE taking these medications    allopurinol 300 MG tablet  Commonly known as: ZYLOPRIM  Take 1 tablet (300 mg total) by mouth daily.     hydroCHLOROthiazide 12.5 MG tablet  Commonly known as: HYDRODIURIL  Take 12.5 mg by mouth daily.     melatonin 10 mg Cap  Take 10 mg by mouth nightly.     prochlorperazine 10 MG tablet  Commonly known as: COMPAZINE  Take 1 tablet (10 mg total) by mouth every six (6) hours as needed for nausea.     valACYclovir 500 MG tablet  Commonly known as: VALTREX  Take 500 mg by mouth daily.     VENCLEXTA 100 mg tablet  Generic drug: venetoclax  Take 4 tablets (400 mg total) by mouth daily for 21 days of each cycle.            Allergies:  Patient has no known allergies.  ______________________________________________________________________  Pending Test Results (if blank, then none):  Pending Labs     Order Current Status    CBC w/ Differential In process    Morphology Review In process    Blood Culture #1 Preliminary result    Blood Culture #2 Preliminary result          Most Recent Labs:  All lab results last 24 hours -   Recent Results (from the past 24 hour(s))   CBC w/ Differential    Collection Time: 03/18/21  2:52 PM   Result Value Ref Range    WBC 12.1 (H) 3.6 - 11.2 10*9/L    RBC 2.71 (L) 4.26 - 5.60 10*12/L    HGB 7.2 (L) 12.9 - 16.5 g/dL    HCT 57.8 (L) 46.9 - 48.0 %    MCV 79.8 77.6 - 95.7 fL    MCH 26.6 25.9 - 32.4 pg    MCHC 33.4 32.0 - 36.0 g/dL    RDW 62.9 (H) 52.8 - 15.2 %    MPV 7.3 6.8 - 10.7 fL    Platelet 659 (H) 150 - 450 10*9/L    Neutrophils % 62.9 %    Lymphocytes % 8.3 %    Monocytes % 27.0 %    Eosinophils % 0.4 %    Basophils % 1.4 %    Absolute Neutrophils 7.6 1.8 - 7.8 10*9/L    Absolute Lymphocytes 1.0 (L) 1.1 - 3.6 10*9/L    Absolute Monocytes 3.3 (H) 0.3 - 0.8 10*9/L    Absolute Eosinophils 0.0 0.0 - 0.5 10*9/L    Absolute Basophils 0.2 (H) 0.0 - 0.1 10*9/L    Microcytosis Slight (A) Not Present    Anisocytosis Marked (A) Not Present    Hypochromasia Slight (A) Not Present   Hepatic Function Panel    Collection Time: 03/19/21  7:18 AM   Result Value Ref Range    Albumin 2.4 (L) 3.4 - 5.0 g/dL    Total Protein 5.4 (L) 5.7 - 8.2 g/dL    Total Bilirubin 0.4 0.3 - 1.2 mg/dL    Bilirubin, Direct 4.13 0.00 - 0.30 mg/dL    AST 10 <=24 U/L    ALT 7 (L) 10 - 49 U/L    Alkaline Phosphatase 100 46 - 116 U/L   Basic Metabolic Panel    Collection Time: 03/19/21  7:18 AM   Result Value Ref Range    Sodium 140 135 - 145 mmol/L    Potassium 3.0 (L) 3.4 - 4.8 mmol/L    Chloride 103 98 - 107 mmol/L    CO2 30.0 20.0 - 31.0 mmol/L    Anion Gap 7 5 - 14 mmol/L    BUN 6 (L) 9 - 23 mg/dL    Creatinine 4.01 0.27 - 1.10 mg/dL    BUN/Creatinine Ratio 7     eGFR CKD-EPI (2021) Male 88 >=60 mL/min/1.107m2    Glucose 112 70 - 179 mg/dL    Calcium 8.2 (L) 8.7 - 10.4 mg/dL  Magnesium Level    Collection Time: 03/19/21  7:18 AM   Result Value Ref Range    Magnesium 1.8 1.6 - 2.6 mg/dL   Phosphorus Level    Collection Time: 03/19/21  7:18 AM   Result Value Ref Range    Phosphorus 2.7 2.4 - 5.1 mg/dL   CBC w/ Differential    Collection Time: 03/19/21  7:18 AM   Result Value Ref Range    WBC 6.1 3.6 - 11.2 10*9/L    RBC 2.59 (L) 4.26 - 5.60 10*12/L    HGB 6.8 (L) 12.9 - 16.5 g/dL    HCT 16.1 (L) 09.6 - 48.0 %    MCV 79.6 77.6 - 95.7 fL    MCH 26.4 25.9 - 32.4 pg    MCHC 33.1 32.0 - 36.0 g/dL    RDW 04.5 (H) 40.9 - 15.2 %    MPV 7.4 6.8 - 10.7 fL    Platelet 556 (H) 150 - 450 10*9/L    Neutrophils % 61.5 %    Lymphocytes % 17.2 %    Monocytes % 18.0 %    Eosinophils % 0.7 %    Basophils % 2.6 %    Absolute Neutrophils 3.8 1.8 - 7.8 10*9/L    Absolute Lymphocytes 1.1 1.1 - 3.6 10*9/L    Absolute Monocytes 1.1 (H) 0.3 - 0.8 10*9/L    Absolute Eosinophils 0.0 0.0 - 0.5 10*9/L    Absolute Basophils 0.2 (H) 0.0 - 0.1 10*9/L    Microcytosis Slight (A) Not Present    Anisocytosis Marked (A) Not Present    Hypochromasia Slight (A) Not Present   Prepare RBC    Collection Time: 03/19/21 12:07 PM   Result Value Ref Range    Crossmatch Compatible     Unit Blood Type B Pos     ISBT Number 7300     Unit # [id number]     Status Issued     Spec Expiration [id number]     Product ID Red Blood Cells     PRODUCT CODE [id number]        Relevant Studies/Radiology (if blank, then none):  ECG 12 Lead    Result Date: 03/18/2021  SINUS TACHYCARDIA NONSPECIFIC ST AND T WAVE ABNORMALITY ABNORMAL ECG WHEN COMPARED WITH ECG OF 17-Mar-2021 16:26, NO SIGNIFICANT CHANGE WAS FOUND Confirmed by Alla German [phone]) on 03/18/2021 4:09:33 PM    ECG 12 Lead    Result Date: 03/17/2021   Critical Test Result: Long QTc SUPRAVENTRICULAR TACHYCARDIA NONSPECIFIC ST ABNORMALITY ABNORMAL ECG WHEN COMPARED WITH ECG OF 29-Jan-2021 17:12, ST NOW DEPRESSED IN INFERIOR LEADS    XR Chest Portable    Result Date: 03/18/2021  EXAM: XR CHEST PORTABLE DATE: 03/17/2021 5:38 PM ACCESSION: [id number] UN DICTATED: 03/18/2021 8:17 AM INTERPRETATION LOCATION: MAIN CAMPUS CLINICAL DATA: HYPOXEMIA COMPARISON: 01/29/2021 TECHNIQUE: XR CHEST PORTABLE FINDINGS: Unchanged mediastinum. Increased left mid and inferior lung zone airspace opacities. No pneumothorax.     Left airspace process such as pneumonia or aspiration.     CTA Chest W Contrast    Result Date: 03/18/2021  EXAM: CTA Chest for Pulmonary Embolus DATE: 03/18/2021 ACCESSION: [id number] UN DICTATED: 03/18/2021 1:30 AM INTERPRETATION LOCATION: Main Campus CLINICAL INDICATION: 81 years old Male with hypoxia  COMPARISON: 03/17/2021 chest radiograph. TECHNIQUE: Contiguous 1 and 2 mm axial images were reconstructed through the chest following a single breath hold helical acquisition.  Images were reformatted in the sagittal and coronal planes.  Multiplanar MIP slabs were created for more thorough  evaluation of the pulmonary vasculature. Intravenous contrast was administered. The examination was performed with limited z-axis coverage and reduced kVp to improve vascular opacification and reduce breath hold and contrast and radiation dose. kVP:  100 DLP:  236 FINDINGS: Respiratory motion artifact. PULMONARY ARTERIES: No emboli in either lung. LUNGS AND AIRWAYS: Peribronchovascular groundglass nodules and small foci of consolidation. Dependent dependent linear atelectasis. The central airways are patent. Small focus of probable secretions along the anterior trachea. PLEURA: No pleural fluid or pneumothorax., MEDIASTINUM AND LYMPH NODES: No enlarged intrathoracic or axillary lymph nodes are present. Small hiatal hernia.  No other mediastinal abnormalities are present. HEART AND VASCULATURE:  Cardiac chambers normal in size. Mild to moderate calcified coronary artery disease.  Ascending and descending aorta normal in caliber.    There is no pericardial effusion. BONES AND SOFT TISSUES: Gynecomastia. Moderate multilevel degenerative change of the spine with prominent anterior osteophytes. No aggressive osseous lesions. UPPER ABDOMEN:Negative.     No pulmonary emboli. Peribronchovascular groundglass nodules and patchy consolidations, more prominent in the dependent lungs, represent infection versus aspiration.     ______________________________________________________________________            Other Instructions     Call MD for:  extreme fatigue      Call MD for:  persistent nausea or vomiting      Call MD for:  severe uncontrolled pain      Call MD for: Temperature > 38.5 Celsius ( > 101.3 Fahrenheit)      Discharge instructions      You were admitted to [facility] after presenting with bloody bowel movements.  A capsule endoscopy showed an AVM however we were unable to visualize it on endoscopy and perform balloon cauterization.  You received 3 units of blood during hospital stay will need to follow-up regularly to ensure that you do not need any additional blood products.      After an acute hospitalization, we recommend that you contact your primary care provider [facility]  within 2-7 days to make an appointment to discuss these events and any changes in your health care routine.     Please follow-up with your appointments as scheduled.  You have [facility] with [facility] infusion clinic tomorrow as well as on the 16th at [facility] with Dr.Rao.    Please continue to take all medications as previously prescribed, except for the changes as on your discharge summary    We are so glad to see you that you are able to leave the hospital now and it has been a pleasure to be involved in your care. We wish you the very best!  __________________________________________________________________________________________________________________________    Please contact your primary healthcare provider if you are experiencing:   ???? ??Shortness of breath, even at rest.   ???? ??New or worsening swelling in your feet or ankles.   ???? ??You have questions or concerns about your condition or care.       Seek care immediately or call 911 if your symptoms worsen or fail to improve, or if you experience new or worsening fevers, chest pain, shortness of breath, severe or sudden onset headaches, new numbness or weakness, slurred speech, severe diarrhea, vomiting that will not stop, inability to eat or drink, or have any other concerning symptoms.          Follow Up instructions and Outpatient Referrals Call MD for:  extreme fatigue      Call MD for:  persistent nausea or vomiting  Call MD for:  severe uncontrolled pain      Call MD for: Temperature > 38.5 Celsius ( > 101.3 Fahrenheit)      Discharge instructions          Appointments which have been scheduled for you    Mar 23, 2021  2:30 PM  (Arrive by 2:00 PM)  NURSE LAB DRAW with ADULT ONC LAB  [facility] ADULT ONCOLOGY LAB DRAW STATION [place] [facility] REGION) [address]  [place] [address]  [phone]      Mar 23, 2021  3:30 PM  (Arrive by 3:00 PM)  RETURN ACTIVE [place] with Virgil Benedict, [place]  [facility] HEMATOLOGY ONCOLOGY 2ND FLR CANCER HOSP [facility] REGION) [address]  [place] [address]  [phone]      Mar 23, 2021  4:30 PM  (Arrive by 4:00 PM)  LEVEL 120 with ONCDEV BED 57  [place] ONCOLOGY INFUSION [place] [facility] REGION) [address]  [place] [address]  [phone]      Mar 24, 2021  8:30 AM  (Arrive by 8:00 AM)  LEVEL 120 with [employer] CHAIR 05  [place] ONCOLOGY INFUSION [place] [facility] REGION) [address]  [place] [address]  [phone]      Mar 25, 2021  8:30 AM  (Arrive by 8:00 AM)  LEVEL 120 with [employer] CHAIR 25  [place] ONCOLOGY INFUSION [place] [facility] REGION) [address]  [place] [address]  [phone]      Mar 26, 2021  8:00 AM  (Arrive by 7:30 AM)  LEVEL 120 with [employer] CHAIR 05  [place] ONCOLOGY INFUSION [place] [facility] REGION) [address]  [place] [address]  [phone]      Mar 27, 2021  8:30 AM  (Arrive by 8:00 AM)  LEVEL 120 with [employer] CHAIR 10  [place] ONCOLOGY INFUSION [place] [facility] REGION) [address]  [place] [address]  [phone]      Mar 28, 2021  8:00 AM  (Arrive by 7:30 AM)  LEVEL 120 with ONCDEV CHAIR 54  [place] ONCOLOGY INFUSION [place] [facility] REGION) [address]  [place] [address]  [phone]      Mar 29, 2021  7:00 AM  (Arrive by 6:30 AM)  NURSE LAB DRAW with ADULT ONC LAB  [facility] ADULT ONCOLOGY LAB DRAW STATION [place] [facility] REGION) [address]  [place] [address]  [phone]      Mar 29, 2021  8:00 AM  (Arrive by 7:30 AM)  LEVEL 120 with [employer] CHAIR 01  [place] ONCOLOGY INFUSION [place] [facility] REGION) [address]  [place] [address]  [phone]      Apr 01, 2021  8:30 AM  (Arrive by 8:00 AM)  NURSE LAB DRAW with ADULT ONC LAB  [facility] ADULT ONCOLOGY LAB DRAW STATION [place] [facility] REGION) [address]  [place] [address]  [phone]      Apr 01, 2021  9:30 AM  (Arrive by 9:00 AM)  BLOOD TRANSFUSION - 2 UNITS with ONCDEV CHAIR 52  [place] ONCOLOGY INFUSION [facility] REGION) [address]  [place] [address]  [phone]      Apr 06, 2021  9:00 AM  (Arrive by 8:30 AM)  NURSE LAB DRAW with  ADULT ONC LAB  [facility] ADULT ONCOLOGY LAB DRAW STATION [place] [facility] REGION) [address]  [place] [address]  [phone]      Apr 06, 2021 10:00 AM  (Arrive by 9:30 AM)  BLOOD TRANSFUSION - 2 UNITS with [employer] CHAIR 56  [place] ONCOLOGY INFUSION [place] [facility] REGION) [address]  [place] [address]  [phone]      Apr 07, 2021  2:30 PM  (Arrive by 2:00 PM)  NURSE LAB DRAW with ADULT ONC LAB  [facility] ADULT ONCOLOGY LAB DRAW STATION [place] [facility] REGION) [address]  [place] [address]  [phone]      Apr 07, 2021  3:30 PM  (Arrive by 3:00 PM)  RETURN ACTIVE [place] with Virgil Benedict, [place]  [facility] HEMATOLOGY ONCOLOGY 2ND FLR CANCER HOSP [facility] REGION) [address]  [place] [address]  [phone]      Apr 13, 2021  8:00 AM  (Arrive by 7:30 AM)  NURSE LAB DRAW with ADULT ONC LAB  [facility] ADULT ONCOLOGY LAB DRAW STATION [place] [facility] REGION) [address]  [place] [address]  [phone]      Apr 13, 2021  9:00 AM  (Arrive by 8:30 AM)  RETURN ACTIVE [place] with Virgil Benedict, [place]  [place] HEMATOLOGY ONCOLOGY 2ND FLR CANCER HOSP [facility] REGION) [address]  [place] [address]  [phone]      Apr 13, 2021 10:00 AM  (Arrive by 9:30 AM)  BLOOD TRANSFUSION - 2 UNITS with ONCINF CHAIR 38  [place] ONCOLOGY INFUSION [place] [facility] REGION) [address]  [place] [address]  [phone]      Jul 01, 2021  8:00 AM  (Arrive by 7:45 AM)  RETURN  GENERAL with Delsa Sale, MD  [employer] [facility] REGION) [address]  [place] [address]  [phone]           ______________________________________________________________________  Discharge Day Services:  BP 114/59  - Pulse 98  - Temp 36.4 ??C (97.5 ??F) (Oral)  - Resp 18  - Ht 189.2 cm (6' 2.5)  - Wt (!) 137 kg (302 lb 0.5 oz)  - SpO2 99%  - BMI 38.26 kg/m??   Pt seen on the day of discharge and determined appropriate for discharge.    Condition at Discharge: fair    Length of Discharge: I spent greater than 30 mins in the discharge of this patient.

## 2021-03-19 NOTE — Unmapped (Signed)
Pt afebrile, VSS, no pain reported this shift. Pt continues on IV ABX. No complaints overnight. Will continue to monitor status.

## 2021-03-20 DIAGNOSIS — K922 Gastrointestinal hemorrhage, unspecified: Secondary | ICD-10-CM | POA: Diagnosis not present

## 2021-03-20 LAB — BASIC METABOLIC PANEL
ANION GAP: 7 mmol/L (ref 5–14)
BLOOD UREA NITROGEN: 5 mg/dL — ABNORMAL LOW (ref 9–23)
BUN / CREAT RATIO: 7
CALCIUM: 8.5 mg/dL — ABNORMAL LOW (ref 8.7–10.4)
CHLORIDE: 104 mmol/L (ref 98–107)
CO2: 29 mmol/L (ref 20.0–31.0)
CREATININE: 0.71 mg/dL
EGFR CKD-EPI (2021) MALE: >90 mL/min/{1.73_m2} (ref >=60)
GLUCOSE RANDOM: 99 mg/dL (ref 70–179)
POTASSIUM: 3.2 mmol/L — ABNORMAL LOW (ref 3.4–4.8)
SODIUM: 140 mmol/L (ref 135–145)

## 2021-03-20 LAB — CBC W/ AUTO DIFF
BASOPHILS ABSOLUTE COUNT: 0.1 10*9/L (ref 0.0–0.1)
BASOPHILS RELATIVE PERCENT: 1.2 %
EOSINOPHILS ABSOLUTE COUNT: 0.1 10*9/L (ref 0.0–0.5)
EOSINOPHILS RELATIVE PERCENT: 1.3 %
HEMATOCRIT: 24.1 % — ABNORMAL LOW (ref 39.0–48.0)
HEMOGLOBIN: 8.2 g/dL — ABNORMAL LOW (ref 12.9–16.5)
LYMPHOCYTES ABSOLUTE COUNT: 1.1 10*9/L (ref 1.1–3.6)
LYMPHOCYTES RELATIVE PERCENT: 24.4 %
MEAN CORPUSCULAR HEMOGLOBIN CONC: 34 g/dL (ref 32.0–36.0)
MEAN CORPUSCULAR HEMOGLOBIN: 27.4 pg (ref 25.9–32.4)
MEAN CORPUSCULAR VOLUME: 80.6 fL (ref 77.6–95.7)
MEAN PLATELET VOLUME: 7.3 fL (ref 6.8–10.7)
MONOCYTES ABSOLUTE COUNT: 0.7 10*9/L (ref 0.3–0.8)
MONOCYTES RELATIVE PERCENT: 15.5 %
NEUTROPHILS ABSOLUTE COUNT: 2.6 10*9/L (ref 1.8–7.8)
NEUTROPHILS RELATIVE PERCENT: 57.6 %
PLATELET COUNT: 464 10*9/L — ABNORMAL HIGH (ref 150–450)
RED BLOOD CELL COUNT: 2.99 10*12/L — ABNORMAL LOW (ref 4.26–5.60)
RED CELL DISTRIBUTION WIDTH: 20.9 % — ABNORMAL HIGH (ref 12.2–15.2)
WBC ADJUSTED: 4.6 10*9/L (ref 3.6–11.2)

## 2021-03-20 LAB — MAGNESIUM: MAGNESIUM: 1.9 mg/dL (ref 1.6–2.6)

## 2021-03-20 LAB — PHOSPHORUS: PHOSPHORUS: 2.7 mg/dL (ref 2.4–5.1)

## 2021-03-20 MED ORDER — METRONIDAZOLE 500 MG TABLET
ORAL_TABLET | Freq: Three times a day (TID) | ORAL | 0 refills | 5 days | Status: CP
Start: 2021-03-20 — End: 2021-03-25

## 2021-03-20 MED ORDER — POTASSIUM CHLORIDE ER 20 MEQ TABLET,EXTENDED RELEASE(PART/CRYST)
ORAL_TABLET | Freq: Two times a day (BID) | ORAL | 0 refills | 3 days | Status: CP
Start: 2021-03-20 — End: 2021-03-23

## 2021-03-20 MED ORDER — CIPROFLOXACIN 500 MG TABLET
ORAL_TABLET | Freq: Two times a day (BID) | ORAL | 0 refills | 5 days | Status: CP
Start: 2021-03-20 — End: 2021-03-25

## 2021-03-20 MED ORDER — TRANEXAMIC ACID 650 MG TABLET
ORAL_TABLET | Freq: Three times a day (TID) | ORAL | 0 refills | 10 days | Status: CP
Start: 2021-03-20 — End: ?

## 2021-03-20 MED ADMIN — metroNIDAZOLE (FLAGYL) tablet 500 mg: 500 mg | ORAL | @ 15:00:00 | Stop: 2021-03-20

## 2021-03-20 MED ADMIN — pantoprazole (PROTONIX) EC tablet 40 mg: 40 mg | ORAL | @ 15:00:00 | Stop: 2021-03-20

## 2021-03-20 MED ADMIN — ciprofloxacin HCl (CIPRO) tablet 500 mg: 500 mg | ORAL | @ 03:00:00 | Stop: 2021-03-24

## 2021-03-20 MED ADMIN — valACYclovir (VALTREX) tablet 500 mg: 500 mg | ORAL | @ 15:00:00 | Stop: 2021-03-20

## 2021-03-20 MED ADMIN — hydroCHLOROthiazide (HYDRODIURIL) tablet 12.5 mg: 12.5 mg | ORAL | @ 15:00:00 | Stop: 2021-03-20

## 2021-03-20 MED ADMIN — pantoprazole (PROTONIX) EC tablet 40 mg: 40 mg | ORAL | @ 03:00:00

## 2021-03-20 MED ADMIN — metroNIDAZOLE (FLAGYL) tablet 500 mg: 500 mg | ORAL | @ 03:00:00 | Stop: 2021-03-24

## 2021-03-20 MED ADMIN — potassium chloride CR tablet 40 mEq: 40 meq | ORAL | @ 17:00:00 | Stop: 2021-03-20

## 2021-03-20 MED ADMIN — magnesium oxide (MAG-OX) tablet 400 mg: 400 mg | ORAL | @ 15:00:00 | Stop: 2021-03-20

## 2021-03-20 MED ADMIN — ciprofloxacin HCl (CIPRO) tablet 500 mg: 500 mg | ORAL | @ 15:00:00 | Stop: 2021-03-20

## 2021-03-20 MED ADMIN — tranexamic acid tablet 1,300 mg: 1300 mg | ORAL | @ 15:00:00 | Stop: 2021-03-20

## 2021-03-20 MED ADMIN — tranexamic acid tablet 1,300 mg: 1300 mg | ORAL | @ 03:00:00

## 2021-03-20 NOTE — Unmapped (Signed)
1 unit of RBCs administered. Hemoglobin/hematocrit monitored. Tachycardic but other VSS. Tele discontinued. IV zosyn continued transitioned to oral meds. No complaints of pain or discomfort.     Problem: Adult Inpatient Plan of Care  Goal: Plan of Care Review  Outcome: Progressing  Goal: Patient-Specific Goal (Individualized)  Outcome: Progressing  Goal: Absence of Hospital-Acquired Illness or Injury  Outcome: Progressing  Intervention: Identify and Manage Fall Risk  Recent Flowsheet Documentation  Taken 03/19/2021 1640 by Vaughan Browner, RN  Safety Interventions: bleeding precautions  Taken 03/19/2021 1400 by Vaughan Browner, RN  Safety Interventions:   environmental modification   fall reduction program maintained   lighting adjusted for tasks/safety   low bed  Taken 03/19/2021 1000 by Vaughan Browner, RN  Safety Interventions:   fall reduction program maintained   environmental modification   lighting adjusted for tasks/safety   low bed  Taken 03/19/2021 0925 by Vaughan Browner, RN  Safety Interventions:   environmental modification   fall reduction program maintained   lighting adjusted for tasks/safety   low bed  Intervention: Prevent Skin Injury  Recent Flowsheet Documentation  Taken 03/19/2021 0845 by Vaughan Browner, RN  Skin Protection: adhesive use limited  Intervention: Prevent and Manage VTE (Venous Thromboembolism) Risk  Recent Flowsheet Documentation  Taken 03/19/2021 0925 by Vaughan Browner, RN  Activity Management: up in chair  Goal: Optimal Comfort and Wellbeing  Outcome: Progressing  Goal: Readiness for Transition of Care  Outcome: Progressing  Goal: Rounds/Family Conference  Outcome: Progressing

## 2021-03-20 NOTE — Unmapped (Signed)
Physician Discharge Summary [facility]  4 ONC [facility]  [address]  [place] [address]  Dept: [phone]  Loc: [phone]     Identifying Information:   Gary Baird  05/12/1940  [id number]    Primary Care Physician: [facility]     Referring Physician: Anastasio Auerbach Painschab     Code Status: Full Code    Admit Date: 03/12/2021    Discharge Date: 03/20/2021     Discharge To: Home    Discharge Service: [facility] - Hematology Res Floor Team (MEDE)     Discharge Attending Physician: No att. 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 *      Outpatient Provider Follow Up Issues:   Follow-up Plan after discharge:  1. Issues related to hospitalization:   2. Follow-up appointment for lab draws: Follow-up for labs/infusion 1/16 [facility])  3. Follow-up appointment with [facility] Oncology: 03/23/21 Tanda Rockers, A.    Patient's primary oncologist and/or nurse navigator: Dr. Jari Sportsman handoff via Epic message or direct conversation?: Yes.    Supportive Care Recommendations:  We recommend based on the patient???s underlying diagnosis and treatment history the following supportive care:    Blood product support:  Leukoreduced blood products are required.  Irradiated blood products are preferred, but in case of urgent transfusion needs non-irradiated blood products may be used:     -  RBC transfusion threshold: transfuse 1 units for Hgb < 7 g/dL.    Hospital Course:   Hospital course:  Gary Baird is a 81 y.o. 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     Recurrent GI bleed; hx hemorrhoids:   Per chart review, patient was recently admitted for hemorrhagic shock 01/29/2021-02/07/2021 where he was found to have transformation of his CMML to acute leukemia. During his admission, he was noted to have history of frequent bright red bowel movements with a 9 point drop in Hb over 1 month. He has known history of gastric ulcers however he is on a daily PPI. CT A/P 11/26 was negative for bleed as well as prior colonoscopies.  Ended up receiving upper endoscopy, colonoscopy and capsule endoscopy only significant for angiectasia's within the small bowel, likely the source of his recurrent bleeding. Current presentation similar to prior episodes.    Capsule endoscopy showed AVM however on planned balloon enteroscopy unable to visualize AVM.  Plan for outpatient follow-up upon discharge.  Plan to continue home PPI.  Added tranexamic acid 1300 mg 3 times daily.  Patient was transfused throughout his hospital stay.  Plan for 2 times weekly follow-up for transfusions as needed.    Thrombocytosis:   C/f acquired Von Willebrand syndrome (AVWS) 2/2 thrombocytosis:  Pt with extreme/acute thrombocytosis, PLT>2000, previously 100s in December. Potentially reactive thrombocytosis given iron deficiency anemia/blood loss. Can also be due to infection - Viral, bacterial, but less likely given lack of clear infectious source/afebrile. Significant thrombocytosis can be associated with increased risk of bleeding due to acquired von Willebrand Syndrome (above).  1/3 von Willebrand panel not consistent with von Willebrand's disease repeat this admission also showed appropriate function.  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 On monthly azacitidine since 09/2018 for CMML, stopped 12/30/20. Patient developed GI bleeding and worsening leukocytosis and was admitted to [facility] for management on 01/29/21. BMBx identified progression to AML. Started Aza/Ven 02/14/2021. S/p C1 Aza/Ven with BMBx revealing morphologic remission. Recent bone marrow biopsy results, showed <1% blasts, revealing CR. Due for cycle 2 Aza/Ven on 1/9. Per 1/5 note from Dr. Vertell Limber, if ANC </=1,000, patient will require a 1-week delay for his next cycle of treatment.  On discharge ANC greater than 1000.  Plan to arrange follow-up to plan is to startcycle 2 Aza/Ven on 03/17/2021.  Coordinate future treatment cycles to be done locally with [name] at [facility].    Hypoxia- tremors- fever  Approximately 5 PM on 03/17/2021 patient developed tremulousness.  Family relates that he felt overwhelmed however discontinued for 10 to 20 minutes.  He was given 1 dose of Ativan did not relieve his tremulousness.  At that time his other vital signs were stable.  At approximately 530 patient developed fever as well as new oxygen requirement.  Differential included volume overload, hospital-acquired infection, postoperative complications.  CTA negative, however representing primary bronchovascular groundglass nodules and patchy consolidations which could be representative of infection versus aspiration.  White count was increased to 14.7 with an ANC of 9.3.  Patient has been afebrile since that time with stable blood pressures.  Patient weaned from 2 L low flow nasal cannula to room air well.  Blood cultures no growth to date.  Follow blood cultures to 48 hours, no growth to date.  Transition from Zosyn to ciprofloxacin plus Flagyl plan to continue for 5 days upon discharge.  Most likely representative of transitory bacteremia from translocation across gut.     Iron deficiency anemia:  Normocytic anemia:  Iron studies revealed iron deficiency anemia. He was started on ferrous sulfate last visit. Started on Ferrous Sulfate supplementation at last clinic visit, however iron sulfate supplementation was stopped and discontinued during this admission as he is receiving regular blood transfusions.    Hypokalemia: K3.3 1/15, repleted prior to discharge. Discharged on potassium 20 mg BID x 3 days until next follow-up appointment.    GERD:  Continue pantoprazole 40 mg BID  Hypertension:  - Hold home hydrochlorothiazide 12.5 mg daily, restart on discharge  Other/chronic:  - Melatonin 9 mg nightly PRN                      Procedures:  No admission procedures for hospital encounter.  ______________________________________________________________________  Discharge Medications:     Your Medication List      STOP taking these medications    ferrous sulfate 325 (65 FE) MG tablet        START taking these medications    ciprofloxacin HCl 500 MG tablet  Commonly known as: CIPRO  Take 1 tablet (500 mg total) by mouth every twelve (12) hours for 5 days.     metroNIDAZOLE 500 MG tablet  Commonly known as: FLAGYL  Take 1 tablet (500 mg total) by mouth Three (3) times a day for 5 days.     potassium chloride 20 MEQ CR tablet  Take 1 tablet (20 mEq total) by mouth Two (2) times a day for 3 days.     tranexamic acid 650 mg Tab tablet  Take 2 tablets (1,300 mg total) by  mouth Three (3) times a day.        CHANGE how you take these medications    pantoprazole 40 MG tablet  Commonly known as: PROTONIX  Take 1 tablet (40 mg total) by mouth Two (2) times a day.  What changed: when to take this        CONTINUE taking these medications    allopurinol 300 MG tablet  Commonly known as: ZYLOPRIM  Take 1 tablet (300 mg total) by mouth daily.     hydroCHLOROthiazide 12.5 MG tablet  Commonly known as: HYDRODIURIL  Take 12.5 mg by mouth daily.     melatonin 10 mg Cap  Take 10 mg by mouth nightly.     prochlorperazine 10 MG tablet  Commonly known as: COMPAZINE  Take 1 tablet (10 mg total) by mouth every six (6) hours as needed for nausea.     valACYclovir 500 MG tablet  Commonly known as: VALTREX  Take 500 mg by mouth daily.     VENCLEXTA 100 mg tablet  Generic drug: venetoclax  Take 4 tablets (400 mg total) by mouth daily for 21 days of each cycle.            Allergies:  Patient has no known allergies.  ______________________________________________________________________  Pending Test Results (if blank, then none):  Pending Labs     Order Current Status    Blood Culture #1 Preliminary result    Blood Culture #2 Preliminary result          Most Recent Labs:  All lab results last 24 hours -   Recent Results (from the past 24 hour(s))   Basic Metabolic Panel    Collection Time: 03/20/21  8:19 AM   Result Value Ref Range    Sodium 140 135 - 145 mmol/L    Potassium 3.2 (L) 3.4 - 4.8 mmol/L    Chloride 104 98 - 107 mmol/L    CO2 29.0 20.0 - 31.0 mmol/L    Anion Gap 7 5 - 14 mmol/L    BUN 5 (L) 9 - 23 mg/dL    Creatinine 1.61 0.96 - 1.10 mg/dL    BUN/Creatinine Ratio 7     eGFR CKD-EPI (2021) Male >90 >=60 mL/min/1.24m2    Glucose 99 70 - 179 mg/dL    Calcium 8.5 (L) 8.7 - 10.4 mg/dL   Magnesium Level    Collection Time: 03/20/21  8:19 AM   Result Value Ref Range    Magnesium 1.9 1.6 - 2.6 mg/dL   Phosphorus Level    Collection Time: 03/20/21  8:19 AM   Result Value Ref Range    Phosphorus 2.7 2.4 - 5.1 mg/dL   CBC w/ Differential    Collection Time: 03/20/21  8:20 AM   Result Value Ref Range    WBC 4.6 3.6 - 11.2 10*9/L    RBC 2.99 (L) 4.26 - 5.60 10*12/L    HGB 8.2 (L) 12.9 - 16.5 g/dL    HCT 04.5 (L) 40.9 - 48.0 %    MCV 80.6 77.6 - 95.7 fL    MCH 27.4 25.9 - 32.4 pg    MCHC 34.0 32.0 - 36.0 g/dL    RDW 81.1 (H) 91.4 - 15.2 %    MPV 7.3 6.8 - 10.7 fL    Platelet 464 (H) 150 - 450 10*9/L    Neutrophils % 57.6 %    Lymphocytes % 24.4 %    Monocytes % 15.5 %    Eosinophils %  1.3 %    Basophils % 1.2 %    Absolute Neutrophils 2.6 1.8 - 7.8 10*9/L    Absolute Lymphocytes 1.1 1.1 - 3.6 10*9/L    Absolute Monocytes 0.7 0.3 - 0.8 10*9/L    Absolute Eosinophils 0.1 0.0 - 0.5 10*9/L    Absolute Basophils 0.1 0.0 - 0.1 10*9/L    Anisocytosis Moderate (A) Not Present     ______________________________________________________________________  Discharge Instructions:             Other Instructions     Call MD for:  extreme fatigue      Call MD for:  persistent nausea or vomiting      Call MD for:  severe uncontrolled pain      Call MD for: Temperature > 38.5 Celsius ( > 101.3 Fahrenheit)      Discharge instructions      You were admitted to [facility] after presenting with bloody bowel movements.  A capsule endoscopy showed an AVM however we were unable to visualize it on endoscopy and perform balloon cauterization.  You received 3 units of blood during hospital stay will need to follow-up regularly to ensure that you do not need any additional blood products.    We would like you to start taking antibiotics from 1/15-1/19, listed below:  Please start taking Flagyl 500 mg 1 tablet 3 times daily  Please start taking ciprofloxacin 500 mg 1 tablet twice daily    Please start this medication which will help prevent some of your GI bleeding:  Please start taking tranexamic acid 1300 mg 3 times daily    Your potassium levels were low during your stay. Please take Klor-Con tablets, 20 mg twice daily for the next 3 days before your next appointment with Dr. Marylouise Stacks have an infusion visit scheduled for 1/16 at [facility] and a follow-up visit with Dr. Bronson Curb team on 1/18 as well.    After an acute hospitalization, we recommend that you contact your primary care provider [facility]  within 2-7 days to make an appointment to discuss these events and any changes in your health care routine.     Please follow-up with your appointments as scheduled.  You have appointments removed January 16th at [facility] with Dr.Rao.  As well as on 18 January with the [facility] infusion clinic     Please continue to take all medications as previously prescribed, except for the changes as on your discharge summary    We are so glad to see you that you are able to leave the hospital now and it has been a pleasure to be involved in your care. We wish you the very best!  __________________________________________________________________________________________________________________________    Please contact your primary healthcare provider if you are experiencing:   ???? ??Shortness of breath, even at rest.   ???? ??New or worsening swelling in your feet or ankles.   ???? ??You have questions or concerns about your condition or care.       Seek care immediately or call 911 if your symptoms worsen or fail to improve, or if you experience new or worsening fevers, chest pain, shortness of breath, severe or sudden onset headaches, new numbness or weakness, slurred speech, severe diarrhea, vomiting that will not stop, inability to eat or drink, or have any other concerning symptoms.          Follow Up instructions and Outpatient Referrals     Call MD for:  extreme  fatigue      Call MD for:  persistent nausea or vomiting      Call MD for:  severe uncontrolled pain      Call MD for: Temperature > 38.5 Celsius ( > 101.3 Fahrenheit)      Discharge instructions          Appointments which have been scheduled for you    Mar 23, 2021  2:30 PM  (Arrive by 2:00 PM)  NURSE LAB DRAW with ADULT ONC LAB  [facility] ADULT ONCOLOGY LAB DRAW STATION [place] [facility] REGION) [address]  [place] [address]  [phone]      Mar 23, 2021  3:30 PM  (Arrive by 3:00 PM)  RETURN ACTIVE  with Virgil Benedict, [place]  [facility] HEMATOLOGY ONCOLOGY 2ND FLR CANCER HOSP [facility] REGION) [address]  [place] [address]  [phone]      Mar 23, 2021  4:30 PM  (Arrive by 4:00 PM)  LEVEL 120 with ONCDEV BED 57  [place] ONCOLOGY INFUSION [place] [facility] REGION) [address]  [place] [address]  [phone]      Mar 24, 2021  8:30 AM  (Arrive by 8:00 AM)  LEVEL 120 with [employer] CHAIR 05  [place] ONCOLOGY INFUSION [place] [facility] REGION) [address]  [place] [address]  [phone]      Mar 25, 2021  8:30 AM  (Arrive by 8:00 AM)  LEVEL 120 with [employer] CHAIR 25  [place] ONCOLOGY INFUSION [place] [facility] REGION) [address]  [place] [address]  [phone]      Mar 26, 2021  8:00 AM  (Arrive by 7:30 AM)  LEVEL 120 with [employer] CHAIR 05  [place] ONCOLOGY INFUSION [place] [facility] REGION) [address]  [place] [address]  [phone]      Mar 27, 2021  8:30 AM  (Arrive by 8:00 AM)  LEVEL 120 with [employer] CHAIR 10  [place] ONCOLOGY INFUSION [place] [facility] REGION) [address]  [place] [address]  [phone]      Mar 28, 2021  8:00 AM  (Arrive by 7:30 AM)  LEVEL 120 with ONCDEV CHAIR 54  [place] ONCOLOGY INFUSION [facility] REGION) [address]  [place] [address]  [phone]      Mar 29, 2021  7:00 AM  (Arrive by 6:30 AM)  NURSE LAB DRAW with ADULT ONC LAB  [facility] ADULT ONCOLOGY LAB DRAW STATION [place] [facility] REGION) [address]  [place] [address]  [phone]      Mar 29, 2021  8:00 AM  (Arrive by 7:30 AM)  LEVEL 120 with [employer] CHAIR 01  [place] ONCOLOGY INFUSION [facility] REGION) [address]  [place] [address]  [phone]      Apr 01, 2021  8:30 AM  (Arrive by 8:00 AM)  NURSE LAB DRAW with ADULT ONC LAB  [facility] ADULT ONCOLOGY LAB DRAW STATION  [facility] REGION) [address]  [place] [address]  [phone]      Apr 01, 2021  9:30 AM  (Arrive by 9:00 AM)  BLOOD TRANSFUSION - 2 UNITS with ONCDEV CHAIR 52  [place] ONCOLOGY INFUSION  [facility] REGION) [address]  [place] [address]  [phone]  Apr 06, 2021  9:00 AM  (Arrive by 8:30 AM)  NURSE LAB DRAW with ADULT ONC LAB  [facility] ADULT ONCOLOGY LAB DRAW STATION [place] [facility] REGION) [address]  [place] [address]  [phone]      Apr 06, 2021 10:00 AM  (Arrive by 9:30 AM)  BLOOD TRANSFUSION - 2 UNITS with [employer] CHAIR 56  [place] ONCOLOGY INFUSION [place] [facility] REGION) [address]  [place] [address]  [phone]      Apr 07, 2021  2:30 PM  (Arrive by 2:00 PM)  NURSE LAB DRAW with ADULT ONC LAB  [facility] ADULT ONCOLOGY LAB DRAW STATION [place] [facility] REGION) [address]  [place] [address]  [phone]      Apr 07, 2021  3:30 PM  (Arrive by 3:00 PM)  RETURN ACTIVE [place] with Virgil Benedict, [place]  [facility] HEMATOLOGY ONCOLOGY 2ND FLR CANCER HOSP [facility] REGION) [address]  [place] [address]  [phone]      Apr 13, 2021  8:00 AM  (Arrive by 7:30 AM)  NURSE LAB DRAW with ADULT ONC LAB  [facility] ADULT ONCOLOGY LAB DRAW STATION [facility] REGION) [address]  [place] [address]  [phone]      Apr 13, 2021  9:00 AM  (Arrive by 8:30 AM)  RETURN ACTIVE [place] with Virgil Benedict, [place]  [place] HEMATOLOGY ONCOLOGY 2ND FLR CANCER HOSP [facility] REGION) [address]  [place] [address]  [phone]      Apr 13, 2021 10:00 AM  (Arrive by 9:30 AM)  BLOOD TRANSFUSION - 2 UNITS with ONCINF CHAIR 38  [place] ONCOLOGY INFUSION  [facility] REGION) [address]  [place] [address]  [phone]      Jul 01, 2021  8:00 AM  (Arrive by 7:45 AM)  RETURN  GENERAL with Delsa Sale, MD   [facility] REGION) [address]  [place] [address]  [phone]           ______________________________________________________________________  Discharge Day Services:  BP 126/65  - Pulse 89  - Temp 35.9 ??C (96.6 ??F) (Oral)  - Resp 18  - Ht 189.2 cm (6' 2.5)  - Wt (!) 137 kg (302 lb 0.5 oz)  - SpO2 97%  - BMI 38.26 kg/m??   Pt seen on the day of discharge and determined appropriate for discharge.    Condition at Discharge: stable    Length of Discharge: I spent greater than 30 mins in the discharge of this patient.

## 2021-03-20 NOTE — Unmapped (Signed)
Pt up in chair throughout shift. AVS reviewed, follow up instructions provided. Discharged to home.    Problem: Adult Inpatient Plan of Care  Goal: Plan of Care Review  Outcome: Discharged to Home  Goal: Patient-Specific Goal (Individualized)  Outcome: Discharged to Home  Goal: Absence of Hospital-Acquired Illness or Injury  Outcome: Discharged to Home  Intervention: Identify and Manage Fall Risk  Recent Flowsheet Documentation  Taken 03/20/2021 0738 by Ronie Spies, RN  Safety Interventions:   fall reduction program maintained   infection management   isolation precautions   lighting adjusted for tasks/safety   low bed   nonskid shoes/slippers when out of bed  Intervention: Prevent and Manage VTE (Venous Thromboembolism) Risk  Recent Flowsheet Documentation  Taken 03/20/2021 0738 by Ronie Spies, RN  Activity Management:   activity encouraged   up in chair  Intervention: Prevent Infection  Recent Flowsheet Documentation  Taken 03/20/2021 1610 by Ronie Spies, RN  Infection Prevention: hand hygiene promoted  Goal: Optimal Comfort and Wellbeing  Outcome: Discharged to Home  Goal: Readiness for Transition of Care  Outcome: Discharged to Home  Goal: Rounds/Family Conference  Outcome: Discharged to Home     Problem: Infection  Goal: Absence of Infection Signs and Symptoms  Outcome: Discharged to Home  Intervention: Prevent or Manage Infection  Recent Flowsheet Documentation  Taken 03/20/2021 0738 by Ronie Spies, RN  Infection Management: aseptic technique maintained  Isolation Precautions: protective precautions maintained     Problem: Fall Injury Risk  Goal: Absence of Fall and Fall-Related Injury  Outcome: Discharged to Home  Intervention: Promote Injury-Free Environment  Recent Flowsheet Documentation  Taken 03/20/2021 9604 by Ronie Spies, RN  Safety Interventions:   fall reduction program maintained   infection management   isolation precautions   lighting adjusted for tasks/safety   low bed   nonskid shoes/slippers when out of bed     Problem: Self-Care Deficit  Goal: Improved Ability to Complete Activities of Daily Living  Outcome: Discharged to Home

## 2021-03-21 ENCOUNTER — Encounter: Payer: Self-pay | Admitting: Oncology

## 2021-03-21 ENCOUNTER — Other Ambulatory Visit: Payer: Self-pay

## 2021-03-21 ENCOUNTER — Encounter: Payer: Self-pay | Admitting: *Deleted

## 2021-03-21 ENCOUNTER — Other Ambulatory Visit: Payer: Self-pay | Admitting: *Deleted

## 2021-03-21 ENCOUNTER — Inpatient Hospital Stay: Payer: Medicare Other

## 2021-03-21 ENCOUNTER — Inpatient Hospital Stay: Payer: Medicare Other | Attending: Oncology | Admitting: Oncology

## 2021-03-21 VITALS — BP 105/65 | HR 91 | Temp 97.5°F | Ht 74.0 in | Wt 299.0 lb

## 2021-03-21 DIAGNOSIS — Z79899 Other long term (current) drug therapy: Secondary | ICD-10-CM | POA: Diagnosis not present

## 2021-03-21 DIAGNOSIS — D509 Iron deficiency anemia, unspecified: Secondary | ICD-10-CM

## 2021-03-21 DIAGNOSIS — C9201 Acute myeloblastic leukemia, in remission: Secondary | ICD-10-CM

## 2021-03-21 DIAGNOSIS — Z5111 Encounter for antineoplastic chemotherapy: Secondary | ICD-10-CM

## 2021-03-21 DIAGNOSIS — C9200 Acute myeloblastic leukemia, not having achieved remission: Secondary | ICD-10-CM | POA: Diagnosis not present

## 2021-03-21 DIAGNOSIS — D75839 Thrombocytosis, unspecified: Secondary | ICD-10-CM

## 2021-03-21 DIAGNOSIS — E876 Hypokalemia: Secondary | ICD-10-CM

## 2021-03-21 DIAGNOSIS — C9310 Chronic myelomonocytic leukemia not having achieved remission: Secondary | ICD-10-CM

## 2021-03-21 LAB — CBC WITH DIFFERENTIAL/PLATELET
Abs Immature Granulocytes: 0.45 10*3/uL — ABNORMAL HIGH (ref 0.00–0.07)
Basophils Absolute: 0.2 10*3/uL — ABNORMAL HIGH (ref 0.0–0.1)
Basophils Relative: 3 %
Eosinophils Absolute: 0.1 10*3/uL (ref 0.0–0.5)
Eosinophils Relative: 2 %
HCT: 28 % — ABNORMAL LOW (ref 39.0–52.0)
Hemoglobin: 8.9 g/dL — ABNORMAL LOW (ref 13.0–17.0)
Immature Granulocytes: 8 %
Lymphocytes Relative: 29 %
Lymphs Abs: 1.6 10*3/uL (ref 0.7–4.0)
MCH: 27.2 pg (ref 26.0–34.0)
MCHC: 31.8 g/dL (ref 30.0–36.0)
MCV: 85.6 fL (ref 80.0–100.0)
Monocytes Absolute: 0.7 10*3/uL (ref 0.1–1.0)
Monocytes Relative: 12 %
Neutro Abs: 2.6 10*3/uL (ref 1.7–7.7)
Neutrophils Relative %: 46 %
Platelets: 482 10*3/uL — ABNORMAL HIGH (ref 150–400)
RBC: 3.27 MIL/uL — ABNORMAL LOW (ref 4.22–5.81)
RDW: 20.3 % — ABNORMAL HIGH (ref 11.5–15.5)
Smear Review: ADEQUATE
WBC: 5.6 10*3/uL (ref 4.0–10.5)
nRBC: 0.4 % — ABNORMAL HIGH (ref 0.0–0.2)

## 2021-03-21 LAB — SAMPLE TO BLOOD BANK

## 2021-03-21 LAB — COMPREHENSIVE METABOLIC PANEL
ALT: 8 U/L (ref 0–44)
AST: 13 U/L — ABNORMAL LOW (ref 15–41)
Albumin: 3.1 g/dL — ABNORMAL LOW (ref 3.5–5.0)
Alkaline Phosphatase: 98 U/L (ref 38–126)
Anion gap: 6 (ref 5–15)
BUN: <5 mg/dL — ABNORMAL LOW (ref 8–23)
CO2: 26 mmol/L (ref 22–32)
Calcium: 8 mg/dL — ABNORMAL LOW (ref 8.9–10.3)
Chloride: 100 mmol/L (ref 98–111)
Creatinine, Ser: 0.59 mg/dL — ABNORMAL LOW (ref 0.61–1.24)
GFR, Estimated: >60 mL/min (ref >60)
Glucose, Bld: 118 mg/dL — ABNORMAL HIGH (ref 70–99)
Potassium: 3.1 mmol/L — ABNORMAL LOW (ref 3.5–5.1)
Sodium: 132 mmol/L — ABNORMAL LOW (ref 135–145)
Total Bilirubin: 0.4 mg/dL (ref 0.3–1.2)
Total Protein: 6.6 g/dL (ref 6.5–8.1)

## 2021-03-21 MED ORDER — AZACITIDINE CHEMO SQ INJECTION
50.0000 mg/m2 | Freq: Once | INTRAMUSCULAR | Status: DC
  Filled 2021-03-21: qty 5.3

## 2021-03-21 MED ORDER — ONDANSETRON HCL 4 MG PO TABS
8.0000 mg | ORAL_TABLET | Freq: Once | ORAL | Status: AC
  Administered 2021-03-21: 8 mg via ORAL
  Filled 2021-03-21: qty 2

## 2021-03-21 MED ORDER — AZACITIDINE CHEMO SQ INJECTION
75.0000 mg/m2 | Freq: Once | INTRAMUSCULAR | Status: AC
  Administered 2021-03-21: 200 mg via SUBCUTANEOUS
  Filled 2021-03-21: qty 8

## 2021-03-21 NOTE — Progress Notes (Signed)
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  He has recently been having bilateral knee pain and has undergone steroid injection in his knee.Patient was noted to have a high white count on his routine exam.  His white count was 19.2 on 07/22/2018 with an H&H of 11.4/35.1 and a platelet count of 470.  At that point he was prescribed a course of antibiotics and a repeat blood work on 08/21/2018 showed white count of 42.3, H&H of 7.1/23 with an MCV of 84 and a platelet count of 791 and hence the patient was referred to hematology for further management.     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  In addition the core biopsy showed a small focus of fibrosis containing scattering of mast cells and eosinophils most suggestive of systemic mastocytosis.bone marrow blasts 7%.  Flow cytometry showed increased number of monocytic cells representing 16% of all cells with expression for HLA-DR, CD11b, CD13, CD14, CD33, CD38, CD56 and CD56 and CD64 with [facility] CD 117 are CD34. normal cytogenetics, CBL, SRSF2, SH2B3 and TET2    Repeat bone marrow biopsy after 4 cycles of Vidaza showed less pronounced monocytic/granulocytic proliferation and overall better granulopoiesis and no increase in blasts.  In addition features of systemic mastocytosis not present.    After 9 cycles of Vidaza patient was noted to have significant anemia and worsening thrombocytosis for which a repeat bone marrow biopsy was done.  Bone marrow did not show any features of progression of CMML.  Overall stable findings and no increased blast.He was found to have iron deficiency and received 2 doses of Feraheme.   Patient seen by [facility] clinic GI and underwent EGD and colonoscopy for iron deficiency anemia.Colonoscopy showed nonbleeding internal hemorrhoids.  Normal mucosa in the colon.  No polyps EGD showed normal esophagus and gastric mucosal atrophy.  No stigmata of bleeding  Patient presented to the ER in November 2022 with significant fatigue and found to have an anemia with a hemoglobin of 5 and significant hyperleukocytosis.  He was transferred to [facility] where it was confirmed that he had transformed to AML.  He was seen by Dr. Janene Madeira as an outpatient and started venetoclax plus Vidaza on 02/14/2021.  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  Capsule endoscopy showed AVM but balloon enteroscopy did not visualize those AVMs.  He will continue to follow-up with GI upon discharge and was started on Amicar 3 times a day.  Patient presently denies any blood loss in his stool or dark melanotic stools.  Reports some baseline fatigue.  Denies any pain  ECOG PS- 1 Pain scale- 0   Review of systems- Review of Systems  Constitutional:  Positive for malaise/fatigue. Negative for chills, fever and  weight loss.  HENT:  Negative for congestion, ear discharge and nosebleeds.   Eyes:  Negative for blurred vision.  Respiratory:  Negative for cough, hemoptysis, sputum production, shortness of breath and wheezing.   Cardiovascular:  Negative for chest pain, palpitations, orthopnea and claudication.   Gastrointestinal:  Negative for abdominal pain, blood in stool, constipation, diarrhea, heartburn, melena, nausea and vomiting.  Genitourinary:  Negative for dysuria, flank pain, frequency, hematuria and urgency.  Musculoskeletal:  Negative for back pain, joint pain and myalgias.  Skin:  Negative for rash.  Neurological:  Negative for dizziness, tingling, focal weakness, seizures, weakness and headaches.  Endo/Heme/Allergies:  Does not bruise/bleed easily.  Psychiatric/Behavioral:  Negative for depression and suicidal ideas. The patient does not have insomnia.      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[place])    Dyspnea    Hypertension      Past Surgical History:  Procedure Laterality Date   COLONOSCOPY N/A 08/28/2019   Procedure: COLONOSCOPY;  Surgeon: Toledo, Benay Pike, MD;  Location: [facility] ENDOSCOPY;  Service: Gastroenterology;  Laterality: N/A;   COLONOSCOPY WITH PROPOFOL N/A 05/03/2015   Procedure: COLONOSCOPY WITH PROPOFOL;  Surgeon: [name], MD;  Location: [facility] ENDOSCOPY;  Service: Gastroenterology;  Laterality: N/A;   ESOPHAGOGASTRODUODENOSCOPY N/A 08/28/2019   Procedure: ESOPHAGOGASTRODUODENOSCOPY (EGD);  Surgeon: Toledo, Benay Pike, MD;  Location: [facility] ENDOSCOPY;  Service: Gastroenterology;  Laterality: N/A;   ESOPHAGOGASTRODUODENOSCOPY (EGD) WITH PROPOFOL N/A 05/03/2015   Procedure: ESOPHAGOGASTRODUODENOSCOPY (EGD) WITH PROPOFOL;  Surgeon: [name], MD;  Location: [facility] ENDOSCOPY;  Service: Gastroenterology;  Laterality: N/A;   HERNIA REPAIR     1975    Social History   Socioeconomic History   Marital status: Divorced    Spouse name: Not on file   Number of children: 4   Years of education: Not on file   Highest education level: Not on file  Occupational History   Occupation: retired    Comment: [employer]  Tobacco Use   Smoking  status: Never   Smokeless tobacco: Never  Vaping Use   Vaping Use: Never used  Substance and Sexual Activity   Alcohol use: No   Drug use: No   Sexual activity: Not Currently  Other Topics Concern   Not on file  Social History Narrative   Not on file   Social Determinants of Health   Financial Resource Strain: Low Risk    Difficulty of Paying Living Expenses: Not very hard  Food Insecurity: Not on file  Transportation Needs: Not on file  Physical Activity: Not on file  Stress: Not on file  Social Connections: Not on file  Intimate Partner Violence: Not on file    Family History  Problem Relation Age of Onset   Alzheimer's disease Father    Lung cancer Sister    Cancer Sister      Current Outpatient Medications:    allopurinol (ZYLOPRIM) 300 MG tablet, Take 1 tablet (300 mg total) by mouth daily., Disp: 90 tablet, Rfl: 2   aspirin EC 81 MG tablet, Take 81 mg by mouth daily., Disp: , Rfl:    furosemide (LASIX) 20 MG tablet, Take 1 tablet (20 mg total) by mouth 2 (two) times daily. Take 52m twice a day for one week. (Patient not taking: Reported on 01/29/2021), Disp: 14 tablet, Rfl: 0   hydrochlorothiazide (MICROZIDE)  12.5 MG capsule, TAKE 1 CAPSULE BY MOUTH EVERY DAY, Disp: 30 capsule, Rfl: 5   pantoprazole (PROTONIX) 40 MG tablet, Take 1 tablet (40 mg total) by mouth daily., Disp: 90 tablet, Rfl: 0   potassium chloride SA (KLOR-CON M20) 20 MEQ tablet, Take 1 tablet (20 mEq total) by mouth 2 (two) times daily., Disp: 180 tablet, Rfl: 1   traZODone (DESYREL) 50 MG tablet, TAKE 1 TABLET BY MOUTH EVERYDAY AT BEDTIME, Disp: 90 tablet, Rfl: 1   triamcinolone ointment (KENALOG) 0.5 %, APPLY 1 APPLICATION TOPICALLY 2 TIMES DAILY. TO THE AFFECTED AREAS OF FEET AND LEFT SIDE OF STOMACH (Patient not taking: Reported on 01/29/2021), Disp: 30 g, Rfl: 0   valACYclovir (VALTREX) 500 MG tablet, TAKE 1 TABLET BY MOUTH EVERY DAY, Disp: 90 tablet, Rfl: 1  Physical exam:  Vitals:   03/21/21  0842  BP: 105/65  Pulse: 91  Temp: (!) 97.5 F (36.4 C)  TempSrc: Tympanic  SpO2: 99%  Weight: 299 lb (135.6 kg)  Height: _0  (1.88 m)   Physical Exam Cardiovascular:     Rate and Rhythm: Normal rate and regular rhythm.     Heart sounds: Normal heart sounds.  Pulmonary:     Effort: Pulmonary effort is normal.     Breath sounds: Normal breath sounds.  Abdominal:     General: Bowel sounds are normal.     Palpations: Abdomen is soft.  Skin:    General: Skin is warm and dry.  Neurological:     Mental Status: He is alert and oriented to person, place, and time.     CMP Latest Ref Rng & Units 02/04/2021  Glucose 70 - 99 mg/dL 114(H)  BUN 8 - 23 mg/dL 9  Creatinine 0.61 - 1.24 mg/dL 1.43(H)  Sodium 135 - 145 mmol/L 137  Potassium 3.5 - 5.1 mmol/L 3.3(L)  Chloride 98 - 111 mmol/L 101  CO2 22 - 32 mmol/L 27  Calcium 8.9 - 10.3 mg/dL 8.3(L)  Total Protein 6.5 - 8.1 g/dL -  Total Bilirubin 0.3 - 1.2 mg/dL -  Alkaline Phos 38 - 126 U/L -  AST 15 - 41 U/L -  ALT 0 - 44 U/L -   CBC Latest Ref Rng & Units 03/21/2021  WBC 4.0 - 10.5 K/uL 5.6  Hemoglobin 13.0 - 17.0 g/dL 8.9(L)  Hematocrit 39.0 - 52.0 % 28.0(L)  Platelets 150 - 400 K/uL 482(H)     Assessment and plan- Patient is a 81 y.o. male with history of secondary AML transformed from CMML currently in CR 1:01 cycle of venetoclax Vidaza here for on treatment assessment prior to cycle 2 of Vidaza  Patient received cycle 1 of Vidaza venetoclax at [facility] and was seen by Dr. Janene Madeira.  He will be receiving further cycles of Vidaza here at [facility] with.  He follow-ups at [facility] as well.  ANC is greater than 1 counts are otherwise okay to proceed withVidaza today he will be receiving Vidaza at 75 mg per metered square 5 days a week this week and 2 days next week.  Venetoclax will be continued at 400 mg daily for 3 weeks on 1 week off.  I did reach out to Dr. Janene Madeira who will cancel his scheduled by laser at [facility] and reschedule his  appointments with him as well.  Patient is receiving venetoclax to [facility] and we will coordinate with him regarding his future cycles as well.  Continue Valtrex prophylaxis along with allopurinol and ciprofloxacin  Hypokalemia: Oral  potassium has been prescribed 20 mEq daily for 2 weeks  We will check weekly CBC for possible blood in 1 week in 2 weeks and I will see him back in 4 weeks for cycle 3 of Vidaza   Visit Diagnosis 1. Encounter for antineoplastic chemotherapy   2. Acute myeloid leukemia in remission ([place])   3. High risk medication use   4. Hypokalemia      Dr. Randa Evens, MD, MPH [facility] [phone] 03/21/2021 8:45 AM

## 2021-03-21 NOTE — Progress Notes (Signed)
Dose increased to 75mg /m2 per MD

## 2021-03-21 NOTE — Patient Instructions (Signed)
[facility] CANCER CTR AT [place]  Discharge Instructions: Thank you for choosing [place] to provide your oncology and hematology care.  If you have a lab appointment with the [facility], please go directly to the [facility] and check in at the registration area.  Wear comfortable clothing and clothing appropriate for easy access to any Portacath or PICC line.   We strive to give you quality time with your provider. You may need to reschedule your appointment if you arrive late (15 or more minutes).  Arriving late affects you and other patients whose appointments are after yours.  Also, if you miss three or more appointments without notifying the office, you may be dismissed from the clinic at the providers discretion.      For prescription refill requests, have your pharmacy contact our office and allow 72 hours for refills to be completed.    Today you received the following chemotherapy and/or immunotherapy agents Vidaza      To help prevent nausea and vomiting after your treatment, we encourage you to take your nausea medication as directed.  BELOW ARE SYMPTOMS THAT SHOULD BE REPORTED IMMEDIATELY: *FEVER GREATER THAN 100.4 F (38 C) OR HIGHER *CHILLS OR SWEATING *NAUSEA AND VOMITING THAT IS NOT CONTROLLED WITH YOUR NAUSEA MEDICATION *UNUSUAL SHORTNESS OF BREATH *UNUSUAL BRUISING OR BLEEDING *URINARY PROBLEMS (pain or burning when urinating, or frequent urination) *BOWEL PROBLEMS (unusual diarrhea, constipation, pain near the anus) TENDERNESS IN MOUTH AND THROAT WITH OR WITHOUT PRESENCE OF ULCERS (sore throat, sores in mouth, or a toothache) UNUSUAL RASH, SWELLING OR PAIN  UNUSUAL VAGINAL DISCHARGE OR ITCHING   Items with * indicate a potential emergency and should be followed up as soon as possible or go to the Emergency Department if any problems should occur.  Please show the CHEMOTHERAPY ALERT CARD or IMMUNOTHERAPY ALERT CARD at check-in to the  Emergency Department and triage nurse.  Should you have questions after your visit or need to cancel or reschedule your appointment, please contact [facility] CANCER [place] AT [place]  [phone] and follow the prompts.  Office hours are 8:00 a.m. to 4:30 p.m. Monday - Friday. Please note that voicemails left after 4:00 p.m. may not be returned until the following business day.  We are closed weekends and major holidays. You have access to a nurse at all times for urgent questions. Please call the main number to the clinic [phone] and follow the prompts.  For any non-urgent questions, you may also contact your provider using MyChart. We now offer e-Visits for anyone 65 and older to request care online for non-urgent symptoms. For details visit [email or web address].   Also download the MyChart app! Go to the app store, search "MyChart", open the app, select [place], and log in with your MyChart username and password.  Due to Covid, a mask is required upon entering the hospital/clinic. If you do not have a mask, one will be given to you upon arrival. For doctor visits, patients may have 1 support person aged 52 or older with them. For treatment visits, patients cannot have anyone with them due to current Covid guidelines and our immunocompromised population.

## 2021-03-22 ENCOUNTER — Ambulatory Visit: Payer: Medicare Other

## 2021-03-22 ENCOUNTER — Inpatient Hospital Stay: Payer: Medicare Other

## 2021-03-22 ENCOUNTER — Encounter: Payer: Self-pay | Admitting: Pharmacist

## 2021-03-22 VITALS — BP 143/68 | HR 102 | Temp 97.1°F | Resp 19

## 2021-03-22 DIAGNOSIS — C9200 Acute myeloblastic leukemia, not having achieved remission: Secondary | ICD-10-CM | POA: Diagnosis not present

## 2021-03-22 DIAGNOSIS — Z5111 Encounter for antineoplastic chemotherapy: Secondary | ICD-10-CM | POA: Diagnosis not present

## 2021-03-22 DIAGNOSIS — Z79899 Other long term (current) drug therapy: Secondary | ICD-10-CM | POA: Diagnosis not present

## 2021-03-22 DIAGNOSIS — H25811 Combined forms of age-related cataract, right eye: Secondary | ICD-10-CM | POA: Diagnosis not present

## 2021-03-22 DIAGNOSIS — C9310 Chronic myelomonocytic leukemia not having achieved remission: Secondary | ICD-10-CM

## 2021-03-22 DIAGNOSIS — H25812 Combined forms of age-related cataract, left eye: Secondary | ICD-10-CM | POA: Diagnosis not present

## 2021-03-22 MED ORDER — ONDANSETRON HCL 4 MG PO TABS
8.0000 mg | ORAL_TABLET | Freq: Once | ORAL | Status: AC
  Administered 2021-03-22: 8 mg via ORAL
  Filled 2021-03-22: qty 2

## 2021-03-22 MED ORDER — AZACITIDINE CHEMO SQ INJECTION
75.0000 mg/m2 | Freq: Once | INTRAMUSCULAR | Status: AC
  Administered 2021-03-22: 200 mg via SUBCUTANEOUS
  Filled 2021-03-22: qty 8

## 2021-03-22 NOTE — Patient Instructions (Signed)
[facility] CANCER CTR AT [place]  Discharge Instructions: Thank you for choosing [place] to provide your oncology and hematology care.  If you have a lab appointment with the [place], please go directly to the [place] and check in at the registration area.  Wear comfortable clothing and clothing appropriate for easy access to any Portacath or PICC line.   We strive to give you quality time with your provider. You may need to reschedule your appointment if you arrive late (15 or more minutes).  Arriving late affects you and other patients whose appointments are after yours.  Also, if you miss three or more appointments without notifying the office, you may be dismissed from the clinic at the providers discretion.      For prescription refill requests, have your pharmacy contact our office and allow 72 hours for refills to be completed.    Today you received the following chemotherapy and/or immunotherapy agents vidaza   To help prevent nausea and vomiting after your treatment, we encourage you to take your nausea medication as directed.  BELOW ARE SYMPTOMS THAT SHOULD BE REPORTED IMMEDIATELY: *FEVER GREATER THAN 100.4 F (38 C) OR HIGHER *CHILLS OR SWEATING *NAUSEA AND VOMITING THAT IS NOT CONTROLLED WITH YOUR NAUSEA MEDICATION *UNUSUAL SHORTNESS OF BREATH *UNUSUAL BRUISING OR BLEEDING *URINARY PROBLEMS (pain or burning when urinating, or frequent urination) *BOWEL PROBLEMS (unusual diarrhea, constipation, pain near the anus) TENDERNESS IN MOUTH AND THROAT WITH OR WITHOUT PRESENCE OF ULCERS (sore throat, sores in mouth, or a toothache) UNUSUAL RASH, SWELLING OR PAIN  UNUSUAL VAGINAL DISCHARGE OR ITCHING   Items with * indicate a potential emergency and should be followed up as soon as possible or go to the Emergency Department if any problems should occur.  Please show the CHEMOTHERAPY ALERT CARD or IMMUNOTHERAPY ALERT CARD at check-in to the  Emergency Department and triage nurse.  Should you have questions after your visit or need to cancel or reschedule your appointment, please contact [facility] CANCER [place] AT [place]  [phone] and follow the prompts.  Office hours are 8:00 a.m. to 4:30 p.m. Monday - Friday. Please note that voicemails left after 4:00 p.m. may not be returned until the following business day.  We are closed weekends and major holidays. You have access to a nurse at all times for urgent questions. Please call the main number to the clinic [phone] and follow the prompts.  For any non-urgent questions, you may also contact your provider using MyChart. We now offer e-Visits for anyone 93 and older to request care online for non-urgent symptoms. For details visit [email or web address].   Also download the MyChart app! Go to the app store, search "MyChart", open the app, select [place], and log in with your MyChart username and password.  Due to Covid, a mask is required upon entering the hospital/clinic. If you do not have a mask, one will be given to you upon arrival. For doctor visits, patients may have 1 support person aged 64 or older with them. For treatment visits, patients cannot have anyone with them due to current Covid guidelines and our immunocompromised population.

## 2021-03-23 ENCOUNTER — Other Ambulatory Visit: Payer: Self-pay

## 2021-03-23 ENCOUNTER — Inpatient Hospital Stay: Payer: Medicare Other | Admitting: Pharmacist

## 2021-03-23 ENCOUNTER — Inpatient Hospital Stay: Payer: Medicare Other

## 2021-03-23 ENCOUNTER — Ambulatory Visit: Payer: Medicare Other

## 2021-03-23 VITALS — BP 127/62 | HR 100 | Temp 98.8°F

## 2021-03-23 DIAGNOSIS — C9200 Acute myeloblastic leukemia, not having achieved remission: Secondary | ICD-10-CM | POA: Diagnosis not present

## 2021-03-23 DIAGNOSIS — C9310 Chronic myelomonocytic leukemia not having achieved remission: Secondary | ICD-10-CM

## 2021-03-23 DIAGNOSIS — Z5111 Encounter for antineoplastic chemotherapy: Secondary | ICD-10-CM | POA: Diagnosis not present

## 2021-03-23 DIAGNOSIS — C9201 Acute myeloblastic leukemia, in remission: Secondary | ICD-10-CM

## 2021-03-23 DIAGNOSIS — Z79899 Other long term (current) drug therapy: Secondary | ICD-10-CM | POA: Diagnosis not present

## 2021-03-23 MED ORDER — ONDANSETRON HCL 4 MG PO TABS
8.0000 mg | ORAL_TABLET | Freq: Once | ORAL | Status: AC
  Administered 2021-03-23: 8 mg via ORAL
  Filled 2021-03-23: qty 2

## 2021-03-23 MED ORDER — AZACITIDINE CHEMO SQ INJECTION
75.0000 mg/m2 | Freq: Once | INTRAMUSCULAR | Status: AC
  Administered 2021-03-23: 200 mg via SUBCUTANEOUS
  Filled 2021-03-23: qty 8

## 2021-03-23 NOTE — Progress Notes (Signed)
[place]  [phone] [phone]  Patient Care Team: Lavera Guise, MD as PCP - General (Internal Medicine) Sindy Guadeloupe, MD as Consulting Physician (Oncology) Edythe Clarity, [facility] as Pharmacist (Pharmacist)   Name of the patient: Samuel Frank  [id number]  1940/12/16   Date of visit: 03/23/21  HPI: Patient is a 81 y.o. male with secondary AML transformed from CMML. Patient started treatment with Venclexta (venetoclax) and azacitidine at [facility]. He was noted to be in remission s/p cycle 1 of venetoclax/azacitidine. He will now be receiving treatment locally.   Reason for Consult: Medication reconciliation. Spoke with patient yesterday in infusion and he was asking about whether he needed to continue taking the medication that he was taking 4 tablets twice daily but he was unable to remember the medication name. In reviewing his old medication list it looked like he was likely talking about his hydroxyurea which he should no longer be taking, but he was unable to confirm. I asked Samuel Frank to bring in all of his medications with him to perform a med rec.    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  MEDICATIONS:  Current Outpatient Medications  Medication Sig Dispense Refill   ciprofloxacin (CIPRO) 500 MG tablet Take 500 mg by mouth every 12 (twelve) hours.     venetoclax (VENCLEXTA) 100 MG tablet Take 400 mg by  mouth daily. Take for 21 days, then hold for 7 days. Repeat every 28 days.     allopurinol (ZYLOPRIM) 300 MG tablet Take 1 tablet (300 mg total) by mouth daily. 90 tablet 2   ferrous sulfate 325 (65 FE) MG tablet Take 325 mg by mouth daily.     furosemide (LASIX) 20 MG tablet Take 1 tablet (20 mg total) by mouth 2 (two) times daily. Take 20mg  twice a day for one week. (Patient not taking: Reported on 01/29/2021) 14 tablet 0   hydrochlorothiazide (MICROZIDE) 12.5 MG capsule TAKE 1 CAPSULE BY MOUTH EVERY DAY 30 capsule 5   Melatonin 10 MG CAPS Take by mouth.     metroNIDAZOLE (FLAGYL) 500 MG tablet Take 500 mg by mouth 3 (three) times daily.     pantoprazole (PROTONIX) 40 MG tablet Take 1 tablet (40 mg total) by mouth daily. 90 tablet 0   potassium chloride SA (KLOR-CON M20) 20 MEQ tablet Take 1 tablet (20 mEq total) by mouth 2 (two) times daily. 180 tablet 1   prochlorperazine (COMPAZINE) 10 MG tablet Take 10 mg by mouth every 6 (six) hours as needed.     tranexamic acid (LYSTEDA) 650 MG TABS tablet Take 1,300 mg by mouth 3 (three) times daily.     traZODone (DESYREL) 50 MG tablet TAKE 1 TABLET BY MOUTH EVERYDAY AT BEDTIME 90 tablet 1   triamcinolone ointment (KENALOG) 0.5 % APPLY 1 APPLICATION TOPICALLY 2 TIMES DAILY. TO THE AFFECTED AREAS OF FEET AND LEFT SIDE OF STOMACH (Patient not taking:  Reported on 01/29/2021) 30 g 0   valACYclovir (VALTREX) 500 MG tablet TAKE 1 TABLET BY MOUTH EVERY DAY 90 tablet 1   No current facility-administered medications for this visit.    VITAL SIGNS: There were no vitals taken for this visit. There were no vitals filed for this visit.  Estimated body mass index is 38.39 kg/m as calculated from the following:   Height as of 03/21/21: 6\' 2"  (1.88 m).   Weight as of 03/21/21: 135.6 kg (299 lb).  LABS: CBC:    Component Value Date/Time   WBC 5.6 03/21/2021 0828   HGB 8.9 (L) 03/21/2021 0828   HGB 7.1 (L) 08/21/2018 0851   HCT 28.0 (L) 03/21/2021 0828   HCT  23.0 (L) 08/21/2018 0851   PLT 482 (H) 03/21/2021 0828   PLT 791 (H) 08/21/2018 0851   MCV 85.6 03/21/2021 0828   MCV 84 08/21/2018 0851   NEUTROABS 2.6 03/21/2021 0828   LYMPHSABS 1.6 03/21/2021 0828   MONOABS 0.7 03/21/2021 0828   EOSABS 0.1 03/21/2021 0828   BASOSABS 0.2 (H) 03/21/2021 0828   Comprehensive Metabolic Panel:    Component Value Date/Time   NA 132 (L) 03/21/2021 0825   NA 136 07/22/2018 0811   K 3.1 (L) 03/21/2021 0825   CL 100 03/21/2021 0825   CO2 26 03/21/2021 0825   BUN <5 (L) 03/21/2021 0825   BUN 16 07/22/2018 0811   CREATININE 0.59 (L) 03/21/2021 0825   GLUCOSE 118 (H) 03/21/2021 0825   CALCIUM 8.0 (L) 03/21/2021 0825   AST 13 (L) 03/21/2021 0825   ALT 8 03/21/2021 0825   ALKPHOS 98 03/21/2021 0825   BILITOT 0.4 03/21/2021 0825   BILITOT 0.3 07/22/2018 0811   PROT 6.6 03/21/2021 0825   PROT 7.4 07/22/2018 0811   ALBUMIN 3.1 (L) 03/21/2021 0825   ALBUMIN 4.1 07/22/2018 0811     Present during today's visit: patient only, seen in infusion  Assessment/Plan: Venetoclax Patient reports that the medication he used to take 4 tablets twice a day was the ventoclax and not the hydroxyurea. I think he may be getting his medications confused, that would be too high a dose of venetoclax and that dosing is not documented anywhere in Epic He understands that he is now taking venetoclax 400mg  (4 tablets) daily for 21 days, then holding for 7 days. He will return to clinic for his next round of azacitidine on 04/18/21 and restart his venetoclax, unless his plan of care changes following his office visit with [facility] on 04/13/21. He was provided with a venetoclax calendar to track his 21on/7off cycle. He knows the calendar will be updated if any treatment changes are made. A copy of the calendar was also emailed to his daughter Samuel Frank.  Valacyclovir  Patient thinks he may be out of his valacyclovir but is unable to confirm and this is not in the medications he brought with  him to clinic. Will confirm when he brings in his remaining medications tomorrow. Refill will be sent in if needed. Patient only brought in a portion of his medications with him today (he will bring the remaining medications tomorrow and a visit addendum will added) During today's visit I was able to look at the bottles a verify the venetoclax, ciprofloxacin (5 days course), metronidazole (5 day course), ferrous sulfate, and tranexamic acid  Mr. Rhoads voiced understanding and appreciation. All questions answered. Medication handout provided.  Time Total: 30 mins  Visit consisted of counseling and education on dealing  with issues of symptom management in the setting of serious and potentially life-threatening illness.Greater than 50%  of this time was spent counseling and coordinating care related to the above assessment and plan.  Signed by: Darl Pikes, PharmD, BCPS, Salley Slaughter, CPP Hematology/Oncology Clinical Pharmacist Practitioner [place]/DB/AP [facility] [phone]  03/23/2021 12:49 PM

## 2021-03-23 NOTE — Unmapped (Signed)
Spoke with daughter - See Mychart message

## 2021-03-24 ENCOUNTER — Inpatient Hospital Stay: Payer: Medicare Other

## 2021-03-24 VITALS — BP 148/68 | HR 98 | Temp 96.6°F | Resp 18

## 2021-03-24 DIAGNOSIS — C9200 Acute myeloblastic leukemia, not having achieved remission: Secondary | ICD-10-CM | POA: Diagnosis not present

## 2021-03-24 DIAGNOSIS — Z5111 Encounter for antineoplastic chemotherapy: Secondary | ICD-10-CM | POA: Diagnosis not present

## 2021-03-24 DIAGNOSIS — Z79899 Other long term (current) drug therapy: Secondary | ICD-10-CM | POA: Diagnosis not present

## 2021-03-24 DIAGNOSIS — C9310 Chronic myelomonocytic leukemia not having achieved remission: Secondary | ICD-10-CM

## 2021-03-24 MED ORDER — ONDANSETRON HCL 4 MG PO TABS
8.0000 mg | ORAL_TABLET | Freq: Once | ORAL | Status: AC
  Administered 2021-03-24: 8 mg via ORAL
  Filled 2021-03-24: qty 2

## 2021-03-24 MED ORDER — AZACITIDINE CHEMO SQ INJECTION
75.0000 mg/m2 | Freq: Once | INTRAMUSCULAR | Status: AC
  Administered 2021-03-24: 200 mg via SUBCUTANEOUS
  Filled 2021-03-24: qty 8

## 2021-03-24 MED ORDER — VALACYCLOVIR HCL 500 MG PO TABS: 500.0000 mg | ORAL_TABLET | Freq: Every day | ORAL | 1 refills | Status: AC

## 2021-03-24 NOTE — Progress Notes (Signed)
Oral Chemotherapy Pharmacist Encounter   Patient brought in his remaining medications today. He had the following medication with him: allopurinol, hydrochlorothiazide, pantoprazole, potassium chloride, prochlorperazine, and trazodone.  Valacyclovir was not present and patient stated that this were all of his medications. Refill for valacyclovir has been sent in, Samuel Frank knows he should resume the valacyclovir as soon as he picks it up from the pharmacy. Received dosing instructions with patient.   Darl Pikes, PharmD, BCPS, BCOP, CPP Hematology/Oncology Clinical Pharmacist [place]/DB/AP [facility] [phone]  03/24/2021 12:54 PM

## 2021-03-25 ENCOUNTER — Inpatient Hospital Stay: Payer: Medicare Other

## 2021-03-25 ENCOUNTER — Other Ambulatory Visit: Payer: Self-pay

## 2021-03-25 VITALS — BP 129/68 | HR 98 | Temp 97.0°F | Resp 18

## 2021-03-25 DIAGNOSIS — C9200 Acute myeloblastic leukemia, not having achieved remission: Secondary | ICD-10-CM | POA: Diagnosis not present

## 2021-03-25 DIAGNOSIS — Z79899 Other long term (current) drug therapy: Secondary | ICD-10-CM | POA: Diagnosis not present

## 2021-03-25 DIAGNOSIS — Z5111 Encounter for antineoplastic chemotherapy: Secondary | ICD-10-CM | POA: Diagnosis not present

## 2021-03-25 DIAGNOSIS — C9310 Chronic myelomonocytic leukemia not having achieved remission: Secondary | ICD-10-CM

## 2021-03-25 MED ORDER — AZACITIDINE CHEMO SQ INJECTION
75.0000 mg/m2 | Freq: Once | INTRAMUSCULAR | Status: AC
  Administered 2021-03-25: 200 mg via SUBCUTANEOUS
  Filled 2021-03-25: qty 8

## 2021-03-25 MED ORDER — ONDANSETRON HCL 4 MG PO TABS
8.0000 mg | ORAL_TABLET | Freq: Once | ORAL | Status: AC
  Administered 2021-03-25: 8 mg via ORAL
  Filled 2021-03-25: qty 2

## 2021-03-25 NOTE — Patient Instructions (Signed)
[facility]  Discharge Instructions: ?Thank you for choosing [facility] to provide your oncology and hematology care.  ?If you have a lab appointment with the Cancer Center, please go directly to the Cancer Center and check in at the registration area. ? ?Wear comfortable clothing and clothing appropriate for easy access to any Portacath or PICC line.  ? ?We strive to give you quality time with your provider. You may need to reschedule your appointment if you arrive late (15 or more minutes).  Arriving late affects you and other patients whose appointments are after yours.  Also, if you miss three or more appointments without notifying the office, you may be dismissed from the clinic at the provider?s discretion.    ?  ?For prescription refill requests, have your pharmacy contact our office and allow 72 hours for refills to be completed.   ? ?  ?To help prevent nausea and vomiting after your treatment, we encourage you to take your nausea medication as directed. ? ?BELOW ARE SYMPTOMS THAT SHOULD BE REPORTED IMMEDIATELY: ?*FEVER GREATER THAN 100.4 F (38 ?C) OR HIGHER ?*CHILLS OR SWEATING ?*NAUSEA AND VOMITING THAT IS NOT CONTROLLED WITH YOUR NAUSEA MEDICATION ?*UNUSUAL SHORTNESS OF BREATH ?*UNUSUAL BRUISING OR BLEEDING ?*URINARY PROBLEMS (pain or burning when urinating, or frequent urination) ?*BOWEL PROBLEMS (unusual diarrhea, constipation, pain near the anus) ?TENDERNESS IN MOUTH AND THROAT WITH OR WITHOUT PRESENCE OF ULCERS (sore throat, sores in mouth, or a toothache) ?UNUSUAL RASH, SWELLING OR PAIN  ?UNUSUAL VAGINAL DISCHARGE OR ITCHING  ? ?Items with * indicate a potential emergency and should be followed up as soon as possible or go to the Emergency Department if any problems should occur. ? ?Please show the CHEMOTHERAPY ALERT CARD or IMMUNOTHERAPY ALERT CARD at check-in to the Emergency Department and triage nurse. ? ?Should you have questions after your visit  or need to cancel or reschedule your appointment, please contact [facility]  [phone] and follow the prompts.  Office hours are 8:00 a.m. to 4:30 p.m. Monday - Friday. Please note that voicemails left after 4:00 p.m. may not be returned until the following business day.  We are closed weekends and major holidays. You have access to a nurse at all times for urgent questions. Please call the main number to the clinic [phone] and follow the prompts. ? ?For any non-urgent questions, you may also contact your provider using MyChart. We now offer e-Visits for anyone 18 and older to request care online for non-urgent symptoms. For details visit [email or web address]. ?  ?Also download the MyChart app! Go to the app store, search "MyChart", open the app, select [facility], and log in with your MyChart username and password. ? ?Due to Covid, a mask is required upon entering the hospital/clinic. If you do not have a mask, one will be given to you upon arrival. For doctor visits, patients may have 1 support person aged 18 or older with them. For treatment visits, patients cannot have anyone with them due to current Covid guidelines and our immunocompromised population.  ?

## 2021-03-28 ENCOUNTER — Inpatient Hospital Stay: Payer: Medicare Other

## 2021-03-28 ENCOUNTER — Other Ambulatory Visit: Payer: Self-pay

## 2021-03-28 ENCOUNTER — Other Ambulatory Visit: Payer: Self-pay | Admitting: *Deleted

## 2021-03-28 VITALS — BP 121/68 | HR 99 | Temp 98.0°F | Resp 18 | Wt 311.1 lb

## 2021-03-28 DIAGNOSIS — C9201 Acute myeloblastic leukemia, in remission: Secondary | ICD-10-CM

## 2021-03-28 DIAGNOSIS — Z5111 Encounter for antineoplastic chemotherapy: Secondary | ICD-10-CM | POA: Diagnosis not present

## 2021-03-28 DIAGNOSIS — C9310 Chronic myelomonocytic leukemia not having achieved remission: Secondary | ICD-10-CM

## 2021-03-28 DIAGNOSIS — C9200 Acute myeloblastic leukemia, not having achieved remission: Secondary | ICD-10-CM | POA: Diagnosis not present

## 2021-03-28 DIAGNOSIS — D509 Iron deficiency anemia, unspecified: Secondary | ICD-10-CM

## 2021-03-28 DIAGNOSIS — Z79899 Other long term (current) drug therapy: Secondary | ICD-10-CM | POA: Diagnosis not present

## 2021-03-28 LAB — CBC WITH DIFFERENTIAL/PLATELET
Abs Immature Granulocytes: 0.05 10*3/uL (ref 0.00–0.07)
Basophils Absolute: 0 10*3/uL (ref 0.0–0.1)
Basophils Relative: 0 %
Eosinophils Absolute: 0 10*3/uL (ref 0.0–0.5)
Eosinophils Relative: 0 %
HCT: 26.8 % — ABNORMAL LOW (ref 39.0–52.0)
Hemoglobin: 8.3 g/dL — ABNORMAL LOW (ref 13.0–17.0)
Immature Granulocytes: 1 %
Lymphocytes Relative: 21 %
Lymphs Abs: 1 10*3/uL (ref 0.7–4.0)
MCH: 27.3 pg (ref 26.0–34.0)
MCHC: 31 g/dL (ref 30.0–36.0)
MCV: 88.2 fL (ref 80.0–100.0)
Monocytes Absolute: 0.9 10*3/uL (ref 0.1–1.0)
Monocytes Relative: 19 %
Neutro Abs: 2.6 10*3/uL (ref 1.7–7.7)
Neutrophils Relative %: 59 %
Platelets: 85 10*3/uL — ABNORMAL LOW (ref 150–400)
RBC: 3.04 MIL/uL — ABNORMAL LOW (ref 4.22–5.81)
RDW: 21.7 % — ABNORMAL HIGH (ref 11.5–15.5)
WBC: 4.5 10*3/uL (ref 4.0–10.5)
nRBC: 0.4 % — ABNORMAL HIGH (ref 0.0–0.2)

## 2021-03-28 LAB — COMPREHENSIVE METABOLIC PANEL
ALT: 7 U/L (ref 0–44)
AST: 13 U/L — ABNORMAL LOW (ref 15–41)
Albumin: 3.3 g/dL — ABNORMAL LOW (ref 3.5–5.0)
Alkaline Phosphatase: 85 U/L (ref 38–126)
Anion gap: 8 (ref 5–15)
BUN: 8 mg/dL (ref 8–23)
CO2: 30 mmol/L (ref 22–32)
Calcium: 8.6 mg/dL — ABNORMAL LOW (ref 8.9–10.3)
Chloride: 98 mmol/L (ref 98–111)
Creatinine, Ser: 0.67 mg/dL (ref 0.61–1.24)
GFR, Estimated: >60 mL/min (ref >60)
Glucose, Bld: 108 mg/dL — ABNORMAL HIGH (ref 70–99)
Potassium: 3.2 mmol/L — ABNORMAL LOW (ref 3.5–5.1)
Sodium: 136 mmol/L (ref 135–145)
Total Bilirubin: 0.6 mg/dL (ref 0.3–1.2)
Total Protein: 6.6 g/dL (ref 6.5–8.1)

## 2021-03-28 LAB — MAGNESIUM: Magnesium: 1.9 mg/dL (ref 1.7–2.4)

## 2021-03-28 LAB — SAMPLE TO BLOOD BANK

## 2021-03-28 MED ORDER — ONDANSETRON HCL 4 MG PO TABS
8.0000 mg | ORAL_TABLET | Freq: Once | ORAL | Status: AC
  Administered 2021-03-28: 8 mg via ORAL
  Filled 2021-03-28: qty 2

## 2021-03-28 MED ORDER — AZACITIDINE CHEMO SQ INJECTION
75.0000 mg/m2 | Freq: Once | INTRAMUSCULAR | Status: AC
  Administered 2021-03-28: 200 mg via SUBCUTANEOUS
  Filled 2021-03-28: qty 8

## 2021-03-28 NOTE — Progress Notes (Signed)
0910- Hemoglobin: 8.3, Platelets: 85,000, Potassium: 3.2 today. MD, Dr. Janese Banks, notified and aware. Per MD order: no blood transfusion today and okay to proceed with scheduled Vidaza treatment only today; instruct patient to continue taking 1 tablet Potassium Chloride SA 20 MEQ PO BID at home. Patient educated and verbalized understanding.

## 2021-03-28 NOTE — Patient Instructions (Signed)
[facility] AT [place]   Discharge Instructions: Thank you for choosing [facility] to provide your oncology and hematology care.  If you have a lab appointment with the [facility], please go directly to the [facility] and check in at the registration area.   We strive to give you quality time with your provider. You may need to reschedule your appointment if you arrive late (15 or more minutes).  Arriving late affects you and other patients whose appointments are after yours.  Also, if you miss three or more appointments without notifying the office, you may be dismissed from the clinic at the providers discretion.      For prescription refill requests, have your pharmacy contact our office and allow 72 hours for refills to be completed.    Today you received the following chemotherapy and/or immunotherapy agents: Vidaza.      To help prevent nausea and vomiting after your treatment, we encourage you to take your nausea medication as directed.  BELOW ARE SYMPTOMS THAT SHOULD BE REPORTED IMMEDIATELY: *FEVER GREATER THAN 100.4 F (38 C) OR HIGHER *CHILLS OR SWEATING *NAUSEA AND VOMITING THAT IS NOT CONTROLLED WITH YOUR NAUSEA MEDICATION *UNUSUAL SHORTNESS OF BREATH *UNUSUAL BRUISING OR BLEEDING *URINARY PROBLEMS (pain or burning when urinating, or frequent urination) *BOWEL PROBLEMS (unusual diarrhea, constipation, pain near the anus) TENDERNESS IN MOUTH AND THROAT WITH OR WITHOUT PRESENCE OF ULCERS (sore throat, sores in mouth, or a toothache) UNUSUAL RASH, SWELLING OR PAIN  UNUSUAL VAGINAL DISCHARGE OR ITCHING   Items with * indicate a potential emergency and should be followed up as soon as possible or go to the Emergency Department if any problems should occur.  Please show the CHEMOTHERAPY ALERT CARD or IMMUNOTHERAPY ALERT CARD at check-in to the Emergency Department and triage nurse.  Should you have questions after your visit or need to  cancel or reschedule your appointment, please contact [facility] [place]  Dept: [phone]  and follow the prompts.  Office hours are 8:00 a.m. to 4:30 p.m. Monday - Friday. Please note that voicemails left after 4:00 p.m. may not be returned until the following business day.  We are closed weekends and major holidays. You have access to a nurse at all times for urgent questions. Please call the main number to the clinic Dept: [phone] and follow the prompts.  For any non-urgent questions, you may also contact your provider using MyChart. We now offer e-Visits for anyone 103 and older to request care online for non-urgent symptoms. For details visit [email or web address].   Also download the MyChart app! Go to the app store, search "MyChart", open the app, select [facility], and log in with your MyChart username and password.  Due to Covid, a mask is required upon entering the hospital/clinic. If you do not have a mask, one will be given to you upon arrival. For doctor visits, patients may have 1 support person aged 77 or older with them. For treatment visits, patients cannot have anyone with them due to current Covid guidelines and our immunocompromised population.

## 2021-03-29 ENCOUNTER — Telehealth: Payer: Self-pay

## 2021-03-29 ENCOUNTER — Inpatient Hospital Stay: Payer: Medicare Other

## 2021-03-29 VITALS — BP 134/59 | HR 98 | Temp 98.7°F | Resp 18

## 2021-03-29 DIAGNOSIS — C9310 Chronic myelomonocytic leukemia not having achieved remission: Secondary | ICD-10-CM

## 2021-03-29 DIAGNOSIS — Z79899 Other long term (current) drug therapy: Secondary | ICD-10-CM | POA: Diagnosis not present

## 2021-03-29 DIAGNOSIS — Z5111 Encounter for antineoplastic chemotherapy: Secondary | ICD-10-CM | POA: Diagnosis not present

## 2021-03-29 DIAGNOSIS — C9200 Acute myeloblastic leukemia, not having achieved remission: Secondary | ICD-10-CM | POA: Diagnosis not present

## 2021-03-29 MED ORDER — AZACITIDINE CHEMO SQ INJECTION
75.0000 mg/m2 | Freq: Once | INTRAMUSCULAR | Status: AC
  Administered 2021-03-29: 13:00:00 200 mg via SUBCUTANEOUS
  Filled 2021-03-29: qty 8

## 2021-03-29 MED ORDER — ONDANSETRON HCL 4 MG PO TABS
8.0000 mg | ORAL_TABLET | Freq: Once | ORAL | Status: AC
  Administered 2021-03-29: 13:00:00 8 mg via ORAL
  Filled 2021-03-29: qty 2

## 2021-03-29 NOTE — Telephone Encounter (Signed)
Lvm to schedule pcp appointment with dfk-Toni

## 2021-03-29 NOTE — Patient Instructions (Signed)
[facility] AT [place]  Discharge Instructions: Thank you for choosing [facility] to provide your oncology and hematology care.  If you have a lab appointment with the [facility], please go directly to the [facility] and check in at the registration area.  Wear comfortable clothing and clothing appropriate for easy access to any Portacath or PICC line.   We strive to give you quality time with your provider. You may need to reschedule your appointment if you arrive late (15 or more minutes).  Arriving late affects you and other patients whose appointments are after yours.  Also, if you miss three or more appointments without notifying the office, you may be dismissed from the clinic at the providers discretion.      For prescription refill requests, have your pharmacy contact our office and allow 72 hours for refills to be completed.    Today you received the following chemotherapy and/or immunotherapy agents Vidaza      To help prevent nausea and vomiting after your treatment, we encourage you to take your nausea medication as directed.  BELOW ARE SYMPTOMS THAT SHOULD BE REPORTED IMMEDIATELY: *FEVER GREATER THAN 100.4 F (38 C) OR HIGHER *CHILLS OR SWEATING *NAUSEA AND VOMITING THAT IS NOT CONTROLLED WITH YOUR NAUSEA MEDICATION *UNUSUAL SHORTNESS OF BREATH *UNUSUAL BRUISING OR BLEEDING *URINARY PROBLEMS (pain or burning when urinating, or frequent urination) *BOWEL PROBLEMS (unusual diarrhea, constipation, pain near the anus) TENDERNESS IN MOUTH AND THROAT WITH OR WITHOUT PRESENCE OF ULCERS (sore throat, sores in mouth, or a toothache) UNUSUAL RASH, SWELLING OR PAIN  UNUSUAL VAGINAL DISCHARGE OR ITCHING   Items with * indicate a potential emergency and should be followed up as soon as possible or go to the Emergency Department if any problems should occur.  Please show the CHEMOTHERAPY ALERT CARD or IMMUNOTHERAPY ALERT CARD at check-in to the  Emergency Department and triage nurse.  Should you have questions after your visit or need to cancel or reschedule your appointment, please contact [facility] [place] AT [place]  [phone] and follow the prompts.  Office hours are 8:00 a.m. to 4:30 p.m. Monday - Friday. Please note that voicemails left after 4:00 p.m. may not be returned until the following business day.  We are closed weekends and major holidays. You have access to a nurse at all times for urgent questions. Please call the main number to the clinic [phone] and follow the prompts.  For any non-urgent questions, you may also contact your provider using MyChart. We now offer e-Visits for anyone 33 and older to request care online for non-urgent symptoms. For details visit [email or web address].   Also download the MyChart app! Go to the app store, search "MyChart", open the app, select [facility], and log in with your MyChart username and password.  Due to Covid, a mask is required upon entering the hospital/clinic. If you do not have a mask, one will be given to you upon arrival. For doctor visits, patients may have 1 support person aged 65 or older with them. For treatment visits, patients cannot have anyone with them due to current Covid guidelines and our immunocompromised population.

## 2021-03-29 NOTE — Progress Notes (Signed)
He is taking it every day and he was told to take it bid

## 2021-03-29 NOTE — Unmapped (Signed)
Hi,     Scherrie November, NP with [facility] contacted the Communication Center requesting to speak with the care team of Gary Baird to discuss:    Following up on faxed clearance form . Faxed on 1/17 for upcoming surgery on 03/31/21.    Please contact Scherrie November at [phone] ext [phone].      Thank you,   Yolanda Bonine  [facility]   [phone]

## 2021-04-02 NOTE — Unmapped (Signed)
[facility] Refill Coordination Note    Specialty Medication(s) to be Shipped:   Hematology/Oncology: Venclexta    Other medication(s) to be shipped: No additional medications requested for fill at this time     Gary Baird, DOB: 1940/09/11  Phone: [phone] (home)       All above HIPAA information was verified with patient's family member, Daughter.     Was a Nurse, learning disability used for this call? No    Completed refill call assessment today to schedule patient's medication shipment from the [facility] [phone]).  All relevant notes have been reviewed.     Specialty medication(s) and dose(s) confirmed: Regimen is correct and unchanged.   Changes to medications: [place] reports no changes at this time.  Changes to insurance: No  New side effects reported not previously addressed with a pharmacist or physician: None reported  Questions for the pharmacist: No    Confirmed patient received a Conservation officer, historic buildings and a Surveyor, mining with first shipment. The patient will receive a drug information handout for each medication shipped and additional FDA Medication Guides as required.       DISEASE/MEDICATION-SPECIFIC INFORMATION        N/A    SPECIALTY MEDICATION ADHERENCE     Medication Adherence    Patient reported X missed doses in the last month: 0  Specialty Medication: Venclexta 100mg   Patient is on additional specialty medications: No  Informant: child/children              Were doses missed due to medication being on hold? No    Venclexta 100 mg: unsure of days of medicine on hand.  New cycle starts 04/18/21       REFERRAL TO PHARMACIST     Referral to the pharmacist: Not needed      [facility]     Shipping address confirmed in Epic.     Delivery Scheduled: Yes, Expected medication delivery date: 04/13/21.     Medication will be delivered via Next Day Courier to the prescription address in Epic [place].    [name] [name]   [facility] Specialty Technician

## 2021-04-04 ENCOUNTER — Other Ambulatory Visit: Payer: Medicare Other

## 2021-04-04 ENCOUNTER — Other Ambulatory Visit: Payer: Self-pay | Admitting: Nurse Practitioner

## 2021-04-04 ENCOUNTER — Inpatient Hospital Stay: Payer: Medicare Other

## 2021-04-04 ENCOUNTER — Other Ambulatory Visit: Payer: Self-pay

## 2021-04-04 DIAGNOSIS — Z79899 Other long term (current) drug therapy: Secondary | ICD-10-CM | POA: Diagnosis not present

## 2021-04-04 DIAGNOSIS — D509 Iron deficiency anemia, unspecified: Secondary | ICD-10-CM

## 2021-04-04 DIAGNOSIS — K219 Gastro-esophageal reflux disease without esophagitis: Secondary | ICD-10-CM

## 2021-04-04 DIAGNOSIS — C9201 Acute myeloblastic leukemia, in remission: Secondary | ICD-10-CM

## 2021-04-04 DIAGNOSIS — C9310 Chronic myelomonocytic leukemia not having achieved remission: Secondary | ICD-10-CM

## 2021-04-04 DIAGNOSIS — C9200 Acute myeloblastic leukemia, not having achieved remission: Secondary | ICD-10-CM | POA: Diagnosis not present

## 2021-04-04 DIAGNOSIS — D75839 Thrombocytosis, unspecified: Secondary | ICD-10-CM

## 2021-04-04 DIAGNOSIS — Z5111 Encounter for antineoplastic chemotherapy: Secondary | ICD-10-CM | POA: Diagnosis not present

## 2021-04-04 LAB — CBC WITH DIFFERENTIAL/PLATELET
Abs Immature Granulocytes: 0.03 10*3/uL (ref 0.00–0.07)
Basophils Absolute: 0 10*3/uL (ref 0.0–0.1)
Basophils Relative: 1 %
Eosinophils Absolute: 0 10*3/uL (ref 0.0–0.5)
Eosinophils Relative: 0 %
HCT: 27.9 % — ABNORMAL LOW (ref 39.0–52.0)
Hemoglobin: 8.4 g/dL — ABNORMAL LOW (ref 13.0–17.0)
Immature Granulocytes: 1 %
Lymphocytes Relative: 20 %
Lymphs Abs: 0.8 10*3/uL (ref 0.7–4.0)
MCH: 27.3 pg (ref 26.0–34.0)
MCHC: 30.1 g/dL (ref 30.0–36.0)
MCV: 90.6 fL (ref 80.0–100.0)
Monocytes Absolute: 0.7 10*3/uL (ref 0.1–1.0)
Monocytes Relative: 17 %
Neutro Abs: 2.4 10*3/uL (ref 1.7–7.7)
Neutrophils Relative %: 61 %
Platelets: 207 10*3/uL (ref 150–400)
RBC: 3.08 MIL/uL — ABNORMAL LOW (ref 4.22–5.81)
RDW: 25.2 % — ABNORMAL HIGH (ref 11.5–15.5)
WBC: 4 10*3/uL (ref 4.0–10.5)
nRBC: 0.5 % — ABNORMAL HIGH (ref 0.0–0.2)

## 2021-04-04 LAB — COMPREHENSIVE METABOLIC PANEL
ALT: 7 U/L (ref 0–44)
AST: 11 U/L — ABNORMAL LOW (ref 15–41)
Albumin: 3.4 g/dL — ABNORMAL LOW (ref 3.5–5.0)
Alkaline Phosphatase: 85 U/L (ref 38–126)
Anion gap: 7 (ref 5–15)
BUN: 6 mg/dL — ABNORMAL LOW (ref 8–23)
CO2: 27 mmol/L (ref 22–32)
Calcium: 8.7 mg/dL — ABNORMAL LOW (ref 8.9–10.3)
Chloride: 101 mmol/L (ref 98–111)
Creatinine, Ser: 0.76 mg/dL (ref 0.61–1.24)
GFR, Estimated: >60 mL/min (ref >60)
Glucose, Bld: 102 mg/dL — ABNORMAL HIGH (ref 70–99)
Potassium: 3.5 mmol/L (ref 3.5–5.1)
Sodium: 135 mmol/L (ref 135–145)
Total Bilirubin: 0.9 mg/dL (ref 0.3–1.2)
Total Protein: 6.9 g/dL (ref 6.5–8.1)

## 2021-04-04 LAB — IRON AND TIBC
Iron: 105 ug/dL (ref 45–182)
Saturation Ratios: 35 % (ref 17.9–39.5)
TIBC: 302 ug/dL (ref 250–450)
UIBC: 197 ug/dL

## 2021-04-04 LAB — SAMPLE TO BLOOD BANK

## 2021-04-04 LAB — FERRITIN: Ferritin: 22 ng/mL — ABNORMAL LOW (ref 24–336)

## 2021-04-04 NOTE — Unmapped (Signed)
[facility] Navigation: Active Treatment Assessment  Completed with: Pt's dtr    Summary: Pt is an 81 yo male living in [place], [place] with primary support from his dtrs and local community. Pt's dtr acknowledges he is very well supported and remains in good spirits as he tries to maintain his daily routine. However, she openly expressed it is very overwhelming and was receptive to learning about emotional support resources. Will continue to follow.    Practical/Logistical:   MyChart user: Yes   Internet Access: Yes  Transportation issues: No  Lodging needs: No  Form literacy: Needs assistance  Education/Referrals/Interventions: Theatre stage manager and [facility] - OPN applied for gas cards following previous encounter although pt's dtr denied knowledge of receiving them, so OPN will contact coordinator to investigate.     Psychosocial:   Identified stressors: *it gets overwhelming sometimes  Healthy coping mechanisms: Yes - faith, mows yards, and stays busy  Positive support system: Yes  Caregiver concerns: No  Education/Referrals/Interventions: Patent examiner, Dietitian, [employer] and [employer]; OPN educated pt's dtr on various services as she does not express an immediate need but would like to look over what is available     Financial:     Patient's perceived financial toxicity: Present - pt has already been approved for a co-pay assistance program as well as additional funding through [facility]. OPN provided award letter for co-pay program to reinforce what to submit.  Food insecurity: Not present  Difficulty paying for medication: No  Education/Referrals/Interventions: [facility]    Medical/Home/Physical Functioning:    Understanding of dx/treatment plan: Yes  Unreported tx side effects/symptoms: No  Issues getting medications: No  Nutrition or appetite concerns: No  Daily activity: decreased activity  Type of residence: Private Residence  Have you fallen in the past year? No  Do you feel unsteady when standing or walking? No  Education/Referrals/Interventions: Triage line/after hours contact information and educated on importance of self-advocacy     Advance Care Planning:   Advance Directives on file: No  Education/Referrals/Interventions: Not addressed at this time     Supportive Communication:  Preferred method: MyChart and Phone  Availability: No preference  Connected to: Not addressed at this time    Additional External Resources: None at this time    Navigation Follow-up Plan: (2-5) Will provide at least 2 follow-up visits.    Patient verbalized understanding of information provided and is in agreement with discussed Navigation Plan. OPN provided Navigation Program's contact information for patient to utilize as needed.

## 2021-04-05 ENCOUNTER — Inpatient Hospital Stay: Payer: Medicare Other

## 2021-04-05 IMAGING — CR LEFT WRIST - COMPLETE 3+ VIEW
1 series · 4 of 4 positions shown · non-contrast
Comparison: 10/30/2011

CLINICAL DATA: 78-year-old male with worsening wrist pain

EXAM:
LEFT WRIST - COMPLETE 3+ VIEW

[Series 1: dg wrist complete left · 0.14mm/px · 4 of 4 slices shown]
[im 1/4]
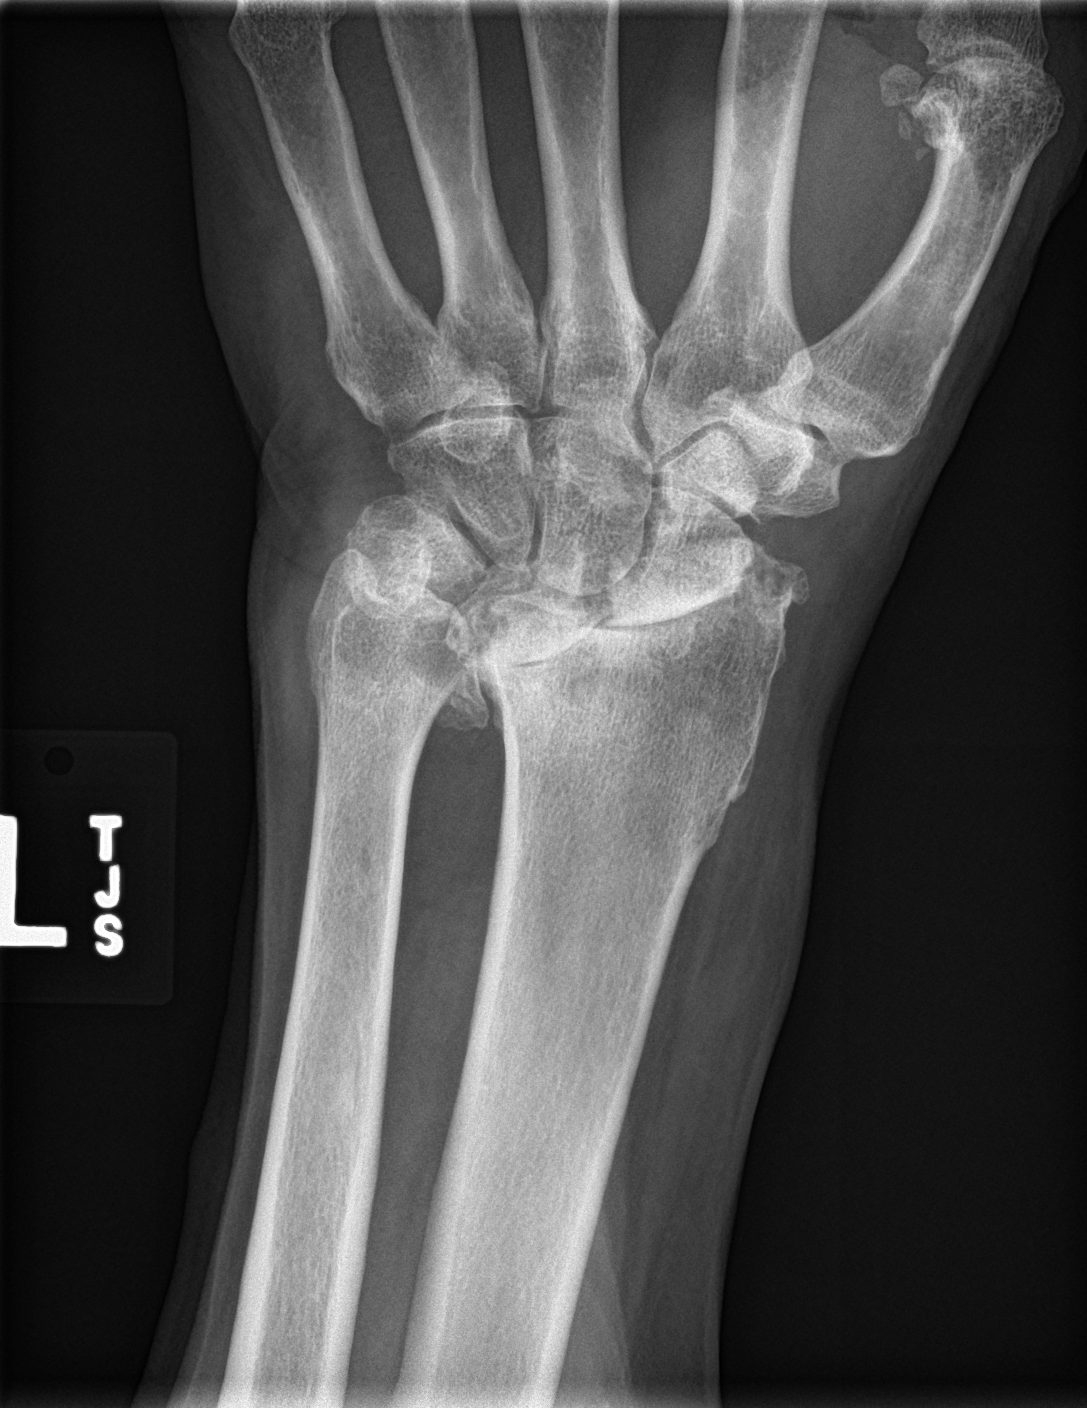
[im 2/4]
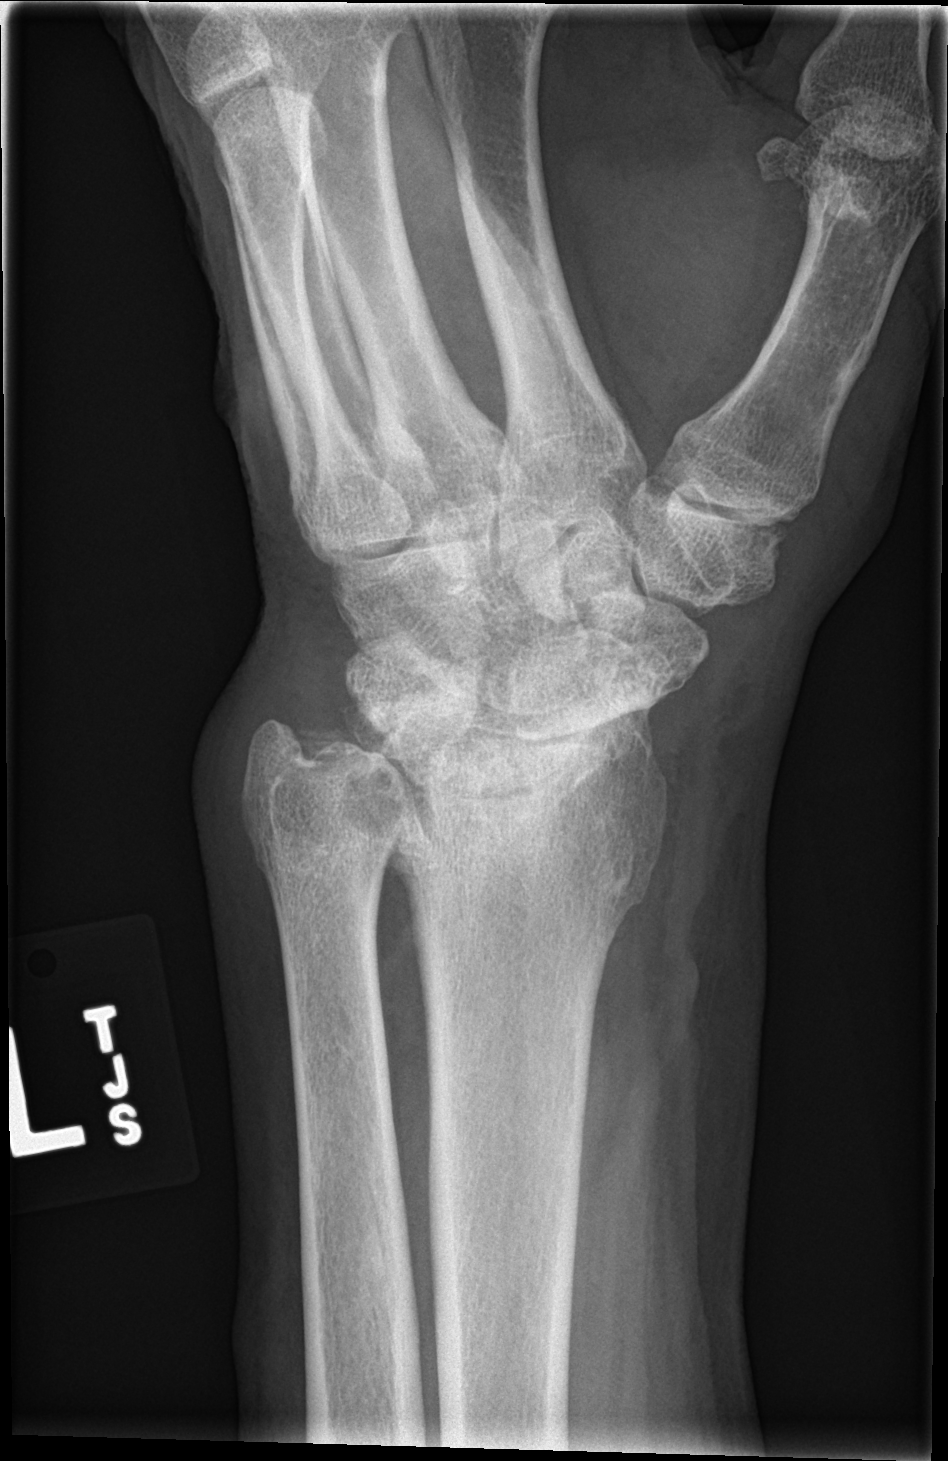
[im 3/4]
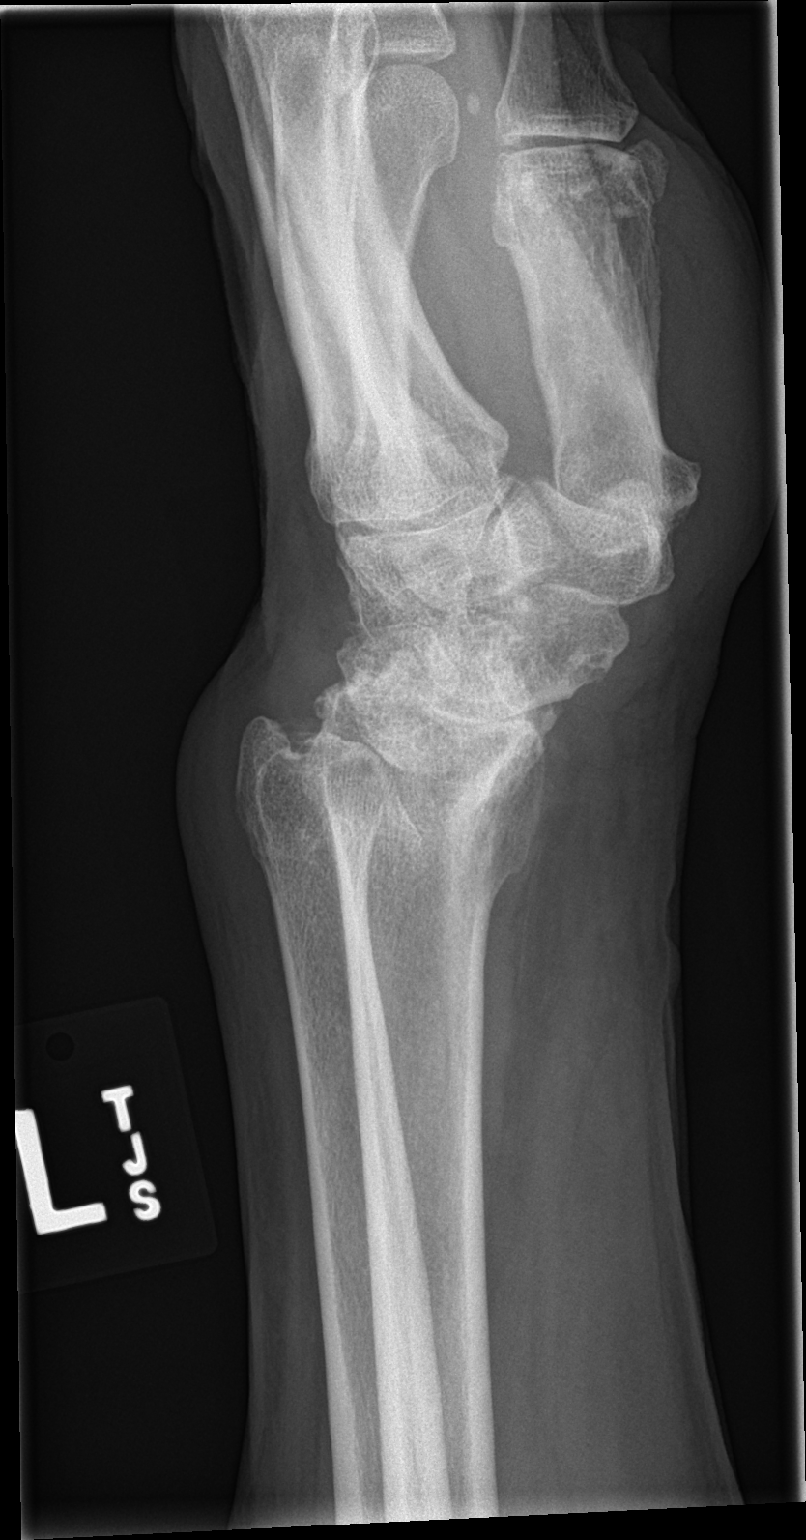
[im 4/4]
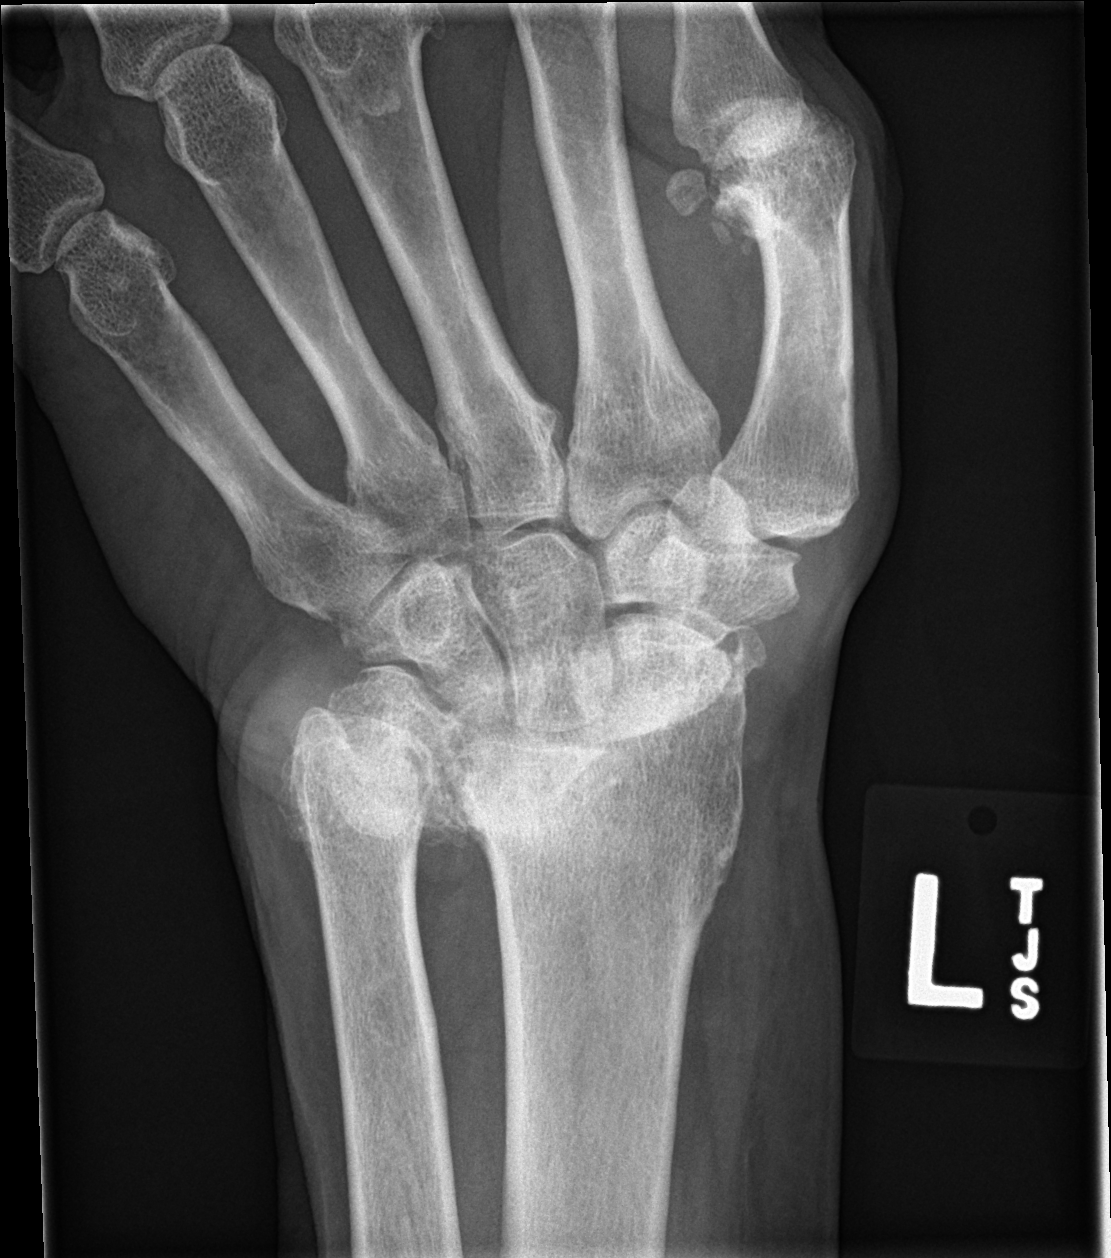

[4 of 4 positions shown; findings below may reference images not displayed]

FINDINGS: Progressive loss of the first carpal arc with further narrowing of
the radiocarpal joint and absorption of bone at the scaphoid and
lunate fossa. Disruption of the ulnar carpal joint on the AP view.
Associated sclerotic changes of the subchondral bone of the carpal
bones and at the radius. Dorsal subluxation/dislocation of the ulna
on the lateral view. No significant widening of the scapholunate
joint. Degenerative changes at the TFC/radiocarpal joint.

No acute displaced fracture identified.
IMPRESSION: Further chronic narrowing of the radiocarpal joint with sclerotic
bone changes of the radius, scaphoid, lunate, with likely bony
resorption on both aspects of the joint.

TFC injury is suspected, with dorsal subluxation of the ulna and
narrowing/disruption of the ulnar carpal joint.

## 2021-04-12 ENCOUNTER — Other Ambulatory Visit: Payer: Self-pay

## 2021-04-12 ENCOUNTER — Encounter: Payer: Self-pay | Admitting: Internal Medicine

## 2021-04-12 ENCOUNTER — Ambulatory Visit (INDEPENDENT_AMBULATORY_CARE_PROVIDER_SITE_OTHER): Payer: Medicare Other | Admitting: Internal Medicine

## 2021-04-12 VITALS — BP 108/66 | HR 101 | Temp 98.9°F | Resp 16 | Ht 74.0 in | Wt 300.6 lb

## 2021-04-12 DIAGNOSIS — G4701 Insomnia due to medical condition: Secondary | ICD-10-CM | POA: Diagnosis not present

## 2021-04-12 DIAGNOSIS — R601 Generalized edema: Secondary | ICD-10-CM

## 2021-04-12 DIAGNOSIS — C9310 Chronic myelomonocytic leukemia not having achieved remission: Secondary | ICD-10-CM | POA: Diagnosis not present

## 2021-04-12 DIAGNOSIS — Z23 Encounter for immunization: Secondary | ICD-10-CM | POA: Diagnosis not present

## 2021-04-12 MED ORDER — PNEUMOCOCCAL 20-VAL CONJ VACC 0.5 ML IM SUSY: 0.5000 mL | PREFILLED_SYRINGE | Freq: Once | INTRAMUSCULAR | 0 refills | Status: AC

## 2021-04-12 MED ORDER — FUROSEMIDE 20 MG PO TABS: ORAL_TABLET | ORAL | 3 refills | Status: DC

## 2021-04-12 MED FILL — VENCLEXTA 100 MG TABLET: ORAL | 28 days supply | Qty: 84 | Fill #1

## 2021-04-12 NOTE — Progress Notes (Signed)
[facility] [place], 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  His white count was 19.2 on 07/22/2018 with an H&H of 11.4/35.1 and a platelet count of 470.  At that point he was prescribed a course of antibiotics and a repeat blood work on 08/21/2018 showed white count of 42.3, H&H of 7.1/23 with an MCV of 84 and a platelet count of 74.  Current Medication: Current Outpatient Medications on File Prior to Visit  Medication Sig Dispense Refill   allopurinol (ZYLOPRIM) 300 MG tablet Take 1 tablet (300 mg total) by mouth daily. 90 tablet 2   ferrous sulfate 325 (65 FE) MG tablet Take 325 mg by mouth daily.     Melatonin 10 MG CAPS Take by mouth daily as needed.     pantoprazole (PROTONIX) 40 MG tablet TAKE 1 TABLET BY MOUTH EVERY DAY 90 tablet 1   potassium chloride SA (KLOR-CON M20) 20 MEQ tablet Take 1 tablet (20 mEq total) by mouth 2 (two) times daily. 180 tablet 1   prochlorperazine (COMPAZINE) 10 MG tablet Take 10 mg by mouth every 6 (six) hours as needed.     tranexamic acid (LYSTEDA) 650 MG TABS tablet Take 1,300 mg by mouth 3 (three) times daily.     traZODone (DESYREL) 50 MG tablet TAKE 1 TABLET BY MOUTH EVERYDAY AT BEDTIME 90 tablet 1   valACYclovir (VALTREX) 500 MG tablet Take 1 tablet (500 mg total) by mouth daily. 90 tablet  1   venetoclax (VENCLEXTA) 100 MG tablet Take 400 mg by mouth daily. Take for 21 days, then hold for 7 days. Repeat every 28 days.     No current facility-administered medications on file prior to visit.      Surgical History: Past Surgical History:  Procedure Laterality Date   COLONOSCOPY N/A 08/28/2019   Procedure: COLONOSCOPY;  Surgeon: Toledo, Benay Pike, MD;  Location: [facility] ENDOSCOPY;  Service: Gastroenterology;  Laterality: N/A;   COLONOSCOPY WITH PROPOFOL N/A 05/03/2015   Procedure: COLONOSCOPY WITH PROPOFOL;  Surgeon: [name], MD;  Location: [facility] ENDOSCOPY;  Service: Gastroenterology;  Laterality: N/A;   ESOPHAGOGASTRODUODENOSCOPY N/A 08/28/2019   Procedure: ESOPHAGOGASTRODUODENOSCOPY (EGD);  Surgeon: Toledo, Benay Pike, MD;  Location: [facility] ENDOSCOPY;  Service: Gastroenterology;  Laterality: N/A;   ESOPHAGOGASTRODUODENOSCOPY (EGD) WITH PROPOFOL N/A 05/03/2015   Procedure: ESOPHAGOGASTRODUODENOSCOPY (EGD) WITH PROPOFOL;  Surgeon: [name], MD;  Location: [facility] ENDOSCOPY;  Service: Gastroenterology;  Laterality: N/A;   HERNIA REPAIR     1975    Medical History: 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[place])    CMML (chronic myelomonocytic leukemia) ([place])    Dyspnea    Hypertension     Family  History: Family History  Problem Relation Age of Onset   Alzheimer's disease Father    Lung cancer Sister    Cancer Sister     Social History   Socioeconomic History   Marital status: Divorced    Spouse name: Not on file   Number of children: 4   Years of education: Not on file   Highest education level: Not on file  Occupational History   Occupation: retired    Comment: [employer]  Tobacco Use   Smoking status: Never   Smokeless tobacco: Never  Vaping Use   Vaping Use: Never used  Substance and Sexual Activity   Alcohol use: No   Drug use: No   Sexual activity: Not Currently  Other Topics Concern   Not on  file  Social History Narrative   Not on file   Social Determinants of Health   Financial Resource Strain: Low Risk    Difficulty of Paying Living Expenses: Not very hard  Food Insecurity: Not on file  Transportation Needs: Not on file  Physical Activity: Not on file  Stress: Not on file  Social Connections: Not on file  Intimate Partner Violence: Not on file      Review of Systems  Constitutional:  Negative for fatigue and fever.  HENT:  Negative for congestion, mouth sores and postnasal drip.   Respiratory:  Positive for shortness of breath. Negative for cough.   Cardiovascular:  Positive for leg swelling. Negative for chest pain.  Genitourinary:  Negative for flank pain.  Psychiatric/Behavioral: Negative.     Vital Signs: BP 108/66    Pulse (!) 101    Temp 98.9 F (37.2 C)    Resp 16    Ht 6\' 2"  (1.88 m)    Wt (!) 300 lb 9.6 oz (136.4 kg)    SpO2 99%    BMI 38.59 kg/m    Physical Exam Constitutional:      Appearance: Normal appearance.  HENT:     Head: Normocephalic and atraumatic.     Nose: Nose normal.     Mouth/Throat:     Mouth: Mucous membranes are moist.     Pharynx: No posterior oropharyngeal erythema.  Eyes:     Extraocular Movements: Extraocular movements intact.     Pupils: Pupils are equal, round, and reactive to light.  Cardiovascular:     Pulses: Normal pulses.     Heart sounds: Normal heart sounds.  Pulmonary:     Effort: Pulmonary effort is normal.     Breath sounds: Normal breath sounds.  Musculoskeletal:     Right lower leg: Edema present.     Left lower leg: Edema present.  Neurological:     General: No focal deficit present.     Mental Status: He is alert.  Psychiatric:        Mood and Affect: Mood normal.        Behavior: Behavior normal.       Assessment/Plan: 1. Generalized edema Mild generalized swelling most likely hypoalbuminemia, will monitor electrolytes  - furosemide (LASIX) 20 MG tablet; Take one tab po qd for swelling   Dispense: 30 tablet; Refill: 3  2. 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 Insomnia due to medical condition Continue Trazadone   4. Need for vaccination against Streptococcus pneumoniae using pneumococcal conjugate vaccine 13 - pneumococcal 20-valent conjugate vaccine (PREVNAR 20) 0.5 ML injection; Inject 0.5 mLs into the muscle once for 1  dose.  Dispense: 0.5 mL; Refill: 0   General Counseling: omran keelin understanding of the findings of todays visit and agrees with plan of treatment. I have discussed any further diagnostic evaluation that may be needed or ordered today. We also reviewed his medications today. he has been encouraged to call the office with any questions or concerns that should arise related to todays visit.    No orders of the defined types were placed in this encounter.   Meds ordered this encounter  Medications   furosemide (LASIX) 20 MG tablet    Sig: Take one tab po qd for swelling    Dispense:  30 tablet    Refill:  3    Please stop hctz   pneumococcal 20-valent conjugate vaccine (PREVNAR 20) 0.5 ML injection    Sig: Inject 0.5 mLs into the muscle once for 1 dose.    Dispense:  0.5 mL    Refill:  0    Total time spent:30 Minutes Time spent includes review of chart, medications, test results, and follow up plan with the patient.   [place] Controlled Substance Database was reviewed by me.   Dr Lavera Guise Internal medicine

## 2021-04-13 ENCOUNTER — Encounter
Admit: 2021-04-13 | Discharge: 2021-04-13 | Payer: MEDICARE | Attending: Hematology & Oncology | Primary: Hematology & Oncology

## 2021-04-13 ENCOUNTER — Other Ambulatory Visit: Admit: 2021-04-13 | Discharge: 2021-04-13 | Payer: MEDICARE

## 2021-04-13 ENCOUNTER — Ambulatory Visit: Admit: 2021-04-13 | Discharge: 2021-04-13 | Payer: MEDICARE

## 2021-04-13 DIAGNOSIS — C9201 Acute myeloblastic leukemia, in remission: Principal | ICD-10-CM

## 2021-04-13 LAB — COMPREHENSIVE METABOLIC PANEL
ALBUMIN: 3.5 g/dL (ref 3.4–5.0)
ALKALINE PHOSPHATASE: 100 U/L (ref 46–116)
ALT (SGPT): <7 U/L — ABNORMAL LOW (ref 10–49)
ANION GAP: 6 mmol/L (ref 5–14)
AST (SGOT): 11 U/L (ref <=34)
BILIRUBIN TOTAL: 0.8 mg/dL (ref 0.3–1.2)
BLOOD UREA NITROGEN: 7 mg/dL — ABNORMAL LOW (ref 9–23)
BUN / CREAT RATIO: 10
CALCIUM: 9.1 mg/dL (ref 8.7–10.4)
CHLORIDE: 103 mmol/L (ref 98–107)
CO2: 28 mmol/L (ref 20.0–31.0)
CREATININE: 0.68 mg/dL
EGFR CKD-EPI (2021) MALE: >90 mL/min/{1.73_m2} (ref >=60)
GLUCOSE RANDOM: 97 mg/dL (ref 70–179)
POTASSIUM: 3.7 mmol/L (ref 3.4–4.8)
PROTEIN TOTAL: 7.2 g/dL (ref 5.7–8.2)
SODIUM: 137 mmol/L (ref 135–145)

## 2021-04-13 LAB — CBC W/ AUTO DIFF
BASOPHILS ABSOLUTE COUNT: 0 10*9/L (ref 0.0–0.1)
BASOPHILS RELATIVE PERCENT: 1.6 %
EOSINOPHILS ABSOLUTE COUNT: 0 10*9/L (ref 0.0–0.5)
EOSINOPHILS RELATIVE PERCENT: 0.5 %
HEMATOCRIT: 28.4 % — ABNORMAL LOW (ref 39.0–48.0)
HEMOGLOBIN: 8.8 g/dL — ABNORMAL LOW (ref 12.9–16.5)
LYMPHOCYTES ABSOLUTE COUNT: 0.6 10*9/L — ABNORMAL LOW (ref 1.1–3.6)
LYMPHOCYTES RELATIVE PERCENT: 28.2 %
MEAN CORPUSCULAR HEMOGLOBIN CONC: 30.9 g/dL — ABNORMAL LOW (ref 32.0–36.0)
MEAN CORPUSCULAR HEMOGLOBIN: 26.2 pg (ref 25.9–32.4)
MEAN CORPUSCULAR VOLUME: 84.6 fL (ref 77.6–95.7)
MEAN PLATELET VOLUME: 8.5 fL (ref 6.8–10.7)
MONOCYTES ABSOLUTE COUNT: 0.4 10*9/L (ref 0.3–0.8)
MONOCYTES RELATIVE PERCENT: 19.8 %
NEUTROPHILS ABSOLUTE COUNT: 1.1 10*9/L — ABNORMAL LOW (ref 1.8–7.8)
NEUTROPHILS RELATIVE PERCENT: 49.9 %
PLATELET COUNT: 359 10*9/L (ref 150–450)
RED BLOOD CELL COUNT: 3.36 10*12/L — ABNORMAL LOW (ref 4.26–5.60)
RED CELL DISTRIBUTION WIDTH: 24.5 % — ABNORMAL HIGH (ref 12.2–15.2)
WBC ADJUSTED: 2.2 10*9/L — ABNORMAL LOW (ref 3.6–11.2)

## 2021-04-13 LAB — SLIDE REVIEW

## 2021-04-13 NOTE — Unmapped (Signed)
[facility] Leukemia Clinic Follow-up  ??  Patient Name: Gary Baird  Patient Age:81 y.o.  Encounter Date: 04/13/2021    ??Primary Care Provider: Lyndon Code, MD  ??  Referring Physician: [facility]  ??  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 He was on monthly azacitidine for two years (since 09/2018) for management of his CMML, last given 12/30/20. He developed new GI bleeding and worsening leukocytosis and was admitted to [facility] for management on 01/29/21. BMbx identified progression to AML with 53% blasts, normal cytogenetics, and mutations in ASXL1, SRSF2, and TET2.  He began treatment on Aza/Ven on 02/14/2021.  He was found to be in MLFS after cycle 1 without MRD.  He has completed 2 cycles to treatment and presents today for follow up.    [name] has tolerated treatment with aza/ven quite well with minimal complications or toxicities.  His cycle 2 was delayed for hematologic recovery and another hospitalization for hematochezia/melena.  On 1/16 he began cycle 2 aza/ven.  On lab review today his counts are stable with ANC 1.1 and plt 359. He is scheduled to start cycle 3 on 2/13 locally with [name].  As long as ANC >/= 1.0 and plt >/= 50 he should be able to proceed with cycle 3 with azacitidine 75mg /m2 x 7 days + venetoclax 400mg  x 21 days.  If his counts continue to drop and he does not meet treatment parameters on 2/13 then we recommend delaying treatment until hematologic recovery and then resume with dose reduced azacitidine 75mg /m2 x 5 days + venetoclax 400mg  x 21 days.  I would recommend he continue to have weekly lab monitoring to monitor hgb.  If this continues to be stable then we could decrease frequency.  He should continue valtrex for ppx.  I will plan to see him back in clinic in 4 weeks just prior to cycle 4.     GI bleed: Admitted 01/2021 for GI bleed at dx of AML.  Again admitted in 03/2021 for hematochezia/melena.  He was also noted to have thrombocytosis at that time that was thought to be reactive in nature.  Workup during most recent hospitalization did not reveal source of bleeding.  Since then his hgb has remained stable.     COVID ppx: 3x COVID mRNA vaccine doses.      Plan and Recommendations:  - proceed with cycle 3 of azacitidine 75mg /m2 x 7 days + venetoclax 400mg  x 21 days on 2/13 with [name] as long as ANC >/= 1.0 and plt >/= 50.   - if ANC is <1.0 and/or plt < 50, treatment should be delayed and resumed upon count recovery with DR'd azacitidine 75mg /m2 x 5 days + venetoclax 400mg  x 21 day.   - continue valtrex  - RTC in 4 weeks for f/u just prior to cycle 4      Dr. Vertell Limber was available    Gary Baird, AGNP-BC  Leukemia Research Nurse Practitioner  Hematology/Oncology Division  [facility]  04/13/2021    I personally spent 50 minutes face-to-face and non-face-to-face in the care of this patient, which includes all pre, intra, and post visit time on the date of service.  All documented time was specific to the E/M visit and does not  include any procedures that may have been performed.    Oncologic History:  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)    Presentation: Incidentally found to have elevated WBC prior to procedure    Labs- WBC = 25.2  Hb = 8.2 g/dL  Platelets = 161  09% monocytes    Bone marrow biopsy- 09/04/2018  Hypercellular marrow- 70-90% cellular, 7% blasts by manual aspirate differential- monocytic appearance, granulocytic dysplasia, dysplasia of megakaryocytes    Final Diagnosis   Date Value Ref Range Status   09/04/2018   Final    (Outside Case #:  [id number], dated 09/04/2018)  Bone marrow,  aspiration and biopsy  -  Hypercellular bone marrow (90%) involved by chronic myelomonocytic leukemia-1 (9% blasts by manual aspirate differential) (see Comment)  -  Unifocal paratrabecular cluster of atypical CD17-positive cells (see Comment)  -  By outside report, cytogenetic analysis reveals a normal karyotype.  -  By outside report, next generation sequencing for myeloid disorders reveals pathogenic or likely pathogenic variants in CBL (p.C384Y), SRSF2 (p.P95H), TET2 (J.Y7829FAO*13), and SH2B3 (Y.Q657QIO*96).    Peripheral blood, smear review  -  Leukocytosis with absolute monocytosis  -  Normocytic anemia  -  Thrombocytosis    This electronic signature is attestation that the pathologist personally reviewed the submitted material(s) and the final diagnosis reflects that evaluation.         Flow cytometry: No myeloblasts detectable, 16% monocytes- HLA-DR, CD11b, CD13, CD14, CD33, CD38, CD56, CD64 and without CD34 expression     Cytogenetics = Normal, 46,XY    Mutational Panel:   CBL C384Y- 19.0% VAF  SH2B3 S256Qfs- 58.8% VAF  SRSF2 P95H- 47.1% VAF  TET2 R1440- 86% VAF    Treatment: Azacitidine 75mg /m2 daily for 5 days monthly (09/30/18--12/31/20)        2. Diagnosis: Secondary AML     Developed hematochezia, fatigue, blurry vision, DOE at end of Nov 2022.  WBC 136, Hb 5.4, Pl 313, peripheral blasts 8%      Diagnosis   Date Value Ref Range Status   01/31/2021   Final    Bone marrow, right iliac, aspiration and biopsy  -  Hypercellular bone marrow (90%) consistent with involvement by acute myeloid leukemia (53% blasts by manual aspirate differential)  -  See linked reports for associated Ancillary Studies.      This electronic signature is attestation that the pathologist personally reviewed the submitted material(s) and the final diagnosis reflects that evaluation.          RESULTS   Date Value Ref Range Status   01/31/2021   Preliminary    Normal Karyotype: 46,XY[10]    Normal FISH:  An interphase FISH assay shows no evidence of a rearrangement involving the KMT2A (MLL) gene region in the 200 nuclei scored.            Myeloid Panel:   Variants of Known/Likely Clinical Significance:   Gene Coding Predicted Protein Variant allele fraction   ASXL1 c.2158del p.(Asp720Thrfs*5) 42.7 %   SRSF2 c.284C>A p.(Pro95His) 45.5 %   TET2 c.4317dupA p.(Arg1440Thrfs*38) 90.4 %     Cycle 1: 02/14/2021  Azacitidine 75mg /m2 x 7 days  Venetoclax 400mg  x 28 days    BMBx 03/08/21   Bone marrow, right iliac, aspiration and biopsy  -  Limited bone marrow sampling with increased megakaryocytes and less than 1% blasts on touch prep differential count (see Comment)   -  See linked reports  for associated Ancillary Studies.    Cycle 2: 03/21/2021  Azacitidine 75mg /m2 x 7 days   Venetoclax 400mg  x 21 days    Cycle 3: 04/18/2021 (tentative start)       CMML (chronic myelomonocytic leukemia) (CMS-HCC)   09/19/2018 Initial Diagnosis    CMML (chronic myelomonocytic leukemia) (CMS-HCC)     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)   02/04/2021 Initial Diagnosis    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)     02/14/2021 -  Chemotherapy    OP AML AZACITIDINE + VENETOCLAX  azacitidine 75 mg/m2 SQ on days 1-7, venetoclax ramp up Week 1 (dose dependent), then azacitidine 75 mg/m2 SQ on Days 1-7 every 28 days      Chemotherapy    OP AML AZACITIDINE + VENETOCLAX      azacitidine 75 mg/m2 SQ on days 1-7, venetoclax ramp up Week 1 (dose dependent), then azacitidine 75 mg/m2 SQ on Days 1-7 every 28 days       Interval History:  Since last seen Mr. Wenzler has been feeling well.  He reports that he has tolerated his treatment with aza/ven well with minimal side effects.  He denies any n/v/d/abdominal pain.  Reports good energy and good appetite.  No recent fevers/chills, sob/cough.  No new complaints today.     Otherwise, he denies new constitutional symptoms such as anorexia, weight loss, night sweats or unexplained fevers.  Furthermore, he denies symptoms of marrow failure: unexplained bleeding or bruising, recurrent or unexplained intercurrent infections, dyspnea on exertion, lightheadedness, palpitations or chest pain.  There have been no new or unexplained pains or self-identified masses, swelling or enlarged lymph nodes.  ??  Past Medical, Surgical and Family History were reviewed and pertinent updates were made in the Electronic Medical Record  ??  Review of Systems:  Other than as reported above in the interim history, the balance of a full 12-system review was performed and unremarkable.  ??  ECOG Performance Status: 1    Allergies: No Known Allergies    Current Medications:  Current Outpatient Medications   Medication Sig Dispense Refill   ??? allopurinol (ZYLOPRIM) 300 MG tablet Take 1 tablet (300 mg total) by mouth daily. 30 tablet 2   ??? furosemide (LASIX) 20 MG tablet Take 20 mg by mouth Two (2) times a day.     ??? melatonin 10 mg cap Take 10 mg by mouth nightly.     ??? pantoprazole (PROTONIX) 40 MG tablet Take 1 tablet (40 mg total) by mouth Two (2) times a day. 60 tablet 0   ??? prochlorperazine (COMPAZINE) 10 MG tablet Take 1 tablet (10 mg total) by mouth every six (6) hours as needed for nausea. 30 tablet 2   ??? valACYclovir (VALTREX) 500 MG tablet Take 500 mg by mouth daily.     ??? venetoclax (VENCLEXTA) 100 mg tablet Take 4 tablets (400 mg total) by mouth daily for 21 days of each cycle. 84 tablet 5     No current facility-administered medications for this visit.     Physical Exam:   Vitals:    04/13/21 1127   BP: 119/68   Pulse: 92   Resp: 16   Temp: 36.9 ??C (98.5 ??F)   SpO2: 100%     Physical Exam:  General: Resting, in no apparent distress  HEENT:  PERRL. No scleral icterus or conjunctival injection.  Heart:  RRR.  S1, S2.  No murmurs, gallops or rubs.  No edema  noted  Lungs:  Breathing is unlabored, and patient is speaking full sentences with ease.  CTAB. No rales, ronchi or crackles.    Abdomen:  No distention or pain on palpation.  Bowel sounds are present.  Obese.  No palpable masses.  Skin:  No rashes, petechiae or purpura. Grossly intact.  Musculoskeletal: Range of motion about the shoulder, elbow, hips and knees is grossly normal.  Strength is equal bilaterally  Psychiatric:  Range of affect is appropriate.    Neurologic:  Alert and oriented x 4.  Steady gait.    Relevant Laboratory, radiology and pathology results:  Lab on 04/13/2021   Component Date Value Ref Range Status   ??? Sodium 04/13/2021 137  135 - 145 mmol/L Final   ??? Potassium 04/13/2021 3.7  3.4 - 4.8 mmol/L Final   ??? Chloride 04/13/2021 103  98 - 107 mmol/L Final   ??? CO2 04/13/2021 28.0  20.0 - 31.0 mmol/L Final   ??? Anion Gap 04/13/2021 6  5 - 14 mmol/L Final   ??? BUN 04/13/2021 7 (L)  9 - 23 mg/dL Final   ??? Creatinine 04/13/2021 0.68  0.60 - 1.10 mg/dL Final   ??? BUN/Creatinine Ratio 04/13/2021 10   Final   ??? eGFR CKD-EPI (2021) Male 04/13/2021 >90  >=60 mL/min/1.68m2 Final    eGFR calculated with CKD-EPI 2021 equation in accordance with [employer] and [employer] of Nephrology Task Force recommendations.   ??? Glucose 04/13/2021 97  70 - 179 mg/dL Final   ??? Calcium 16/12/9602 9.1  8.7 - 10.4 mg/dL Final   ??? Albumin 54/11/8117 3.5  3.4 - 5.0 g/dL Final   ??? Total Protein 04/13/2021 7.2  5.7 - 8.2 g/dL Final   ??? Total Bilirubin 04/13/2021 0.8  0.3 - 1.2 mg/dL Final   ??? AST 14/78/2956 11  <=34 U/L Final   ??? ALT 04/13/2021 <7 (L)  10 - 49 U/L Final   ??? Alkaline Phosphatase 04/13/2021 100  46 - 116 U/L Final   ??? Antibody Screen 04/13/2021 NEG   Final   ??? Blood Type 04/13/2021 B POS   Final   ??? WBC 04/13/2021 2.2 (L)  3.6 - 11.2 10*9/L Final   ??? RBC 04/13/2021 3.36 (L)  4.26 - 5.60 10*12/L Final   ??? HGB 04/13/2021 8.8 (L)  12.9 - 16.5 g/dL Final   ??? HCT 21/30/8657 28.4 (L)  39.0 - 48.0 % Final   ??? MCV 04/13/2021 84.6  77.6 - 95.7 fL Final   ??? MCH 04/13/2021 26.2  25.9 - 32.4 pg Final   ??? MCHC 04/13/2021 30.9 (L)  32.0 - 36.0 g/dL Final   ??? RDW 84/69/6295 24.5 (H)  12.2 - 15.2 % Final   ??? MPV 04/13/2021 8.5  6.8 - 10.7 fL Final   ??? Platelet 04/13/2021 359  150 - 450 10*9/L Final   ??? Neutrophils % 04/13/2021 49.9  % Final   ??? Lymphocytes % 04/13/2021 28.2  % Final   ??? Monocytes % 04/13/2021 19.8  % Final   ??? Eosinophils % 04/13/2021 0.5  % Final   ??? Basophils % 04/13/2021 1.6  % Final   ??? Absolute Neutrophils 04/13/2021 1.1 (L)  1.8 - 7.8 10*9/L Final   ??? Absolute Lymphocytes 04/13/2021 0.6 (L)  1.1 - 3.6 10*9/L Final   ??? Absolute Monocytes 04/13/2021 0.4  0.3 - 0.8 10*9/L Final   ??? Absolute Eosinophils 04/13/2021 0.0  0.0 - 0.5 10*9/L Final   ???  Absolute Basophils 04/13/2021 0.0  0.0 - 0.1 10*9/L Final   ??? Anisocytosis 04/13/2021 Marked (A)  Not Present Final   ??? Hypochromasia 04/13/2021 Slight (A)  Not Present Final   ??? Smear Review Comments 04/13/2021 See Comment (A)  Undefined Final    SLIDE# [id number] REVIEWED   ??? Giant Platelets 04/13/2021 Present (A)  Not Present Final   ??? Acanthocytes 04/13/2021 Present (A)  Not Present Final   ??? Hypersegmented Neutrophils 04/13/2021 Present (A)  Not Present Final

## 2021-04-13 NOTE — Unmapped (Signed)
You should go to [name] on Monday to start your next cycle of treatment.    As long as your neutrophils (ANC) >/= 1.0 and platelets >/= 50 you should be able to start your shot again.     Remember you ONLY start the Venclexta when you start the shots.     If your counts should drop by Monday and your ANC is low, then they should hold treatment until your numbers improve.    IF they need to hold treatment we will then decrease your azacitidine (injection) from 7 days to 5 days.    I want to see you back in 4 weeks just before your next cycle.     Lab on 04/13/2021   Component Date Value Ref Range Status    Sodium 04/13/2021 137  135 - 145 mmol/L Final    Potassium 04/13/2021 3.7  3.4 - 4.8 mmol/L Final    Chloride 04/13/2021 103  98 - 107 mmol/L Final    CO2 04/13/2021 28.0  20.0 - 31.0 mmol/L Final    Anion Gap 04/13/2021 6  5 - 14 mmol/L Final    BUN 04/13/2021 7 (L)  9 - 23 mg/dL Final    Creatinine 16/12/9602 0.68  0.60 - 1.10 mg/dL Final    BUN/Creatinine Ratio 04/13/2021 10   Final    eGFR CKD-EPI (2021) Male 04/13/2021 >90  >=60 mL/min/1.33m2 Final    eGFR calculated with CKD-EPI 2021 equation in accordance with [employer] and [employer] of Nephrology Task Force recommendations.    Glucose 04/13/2021 97  70 - 179 mg/dL Final    Calcium 54/11/8117 9.1  8.7 - 10.4 mg/dL Final    Albumin 14/78/2956 3.5  3.4 - 5.0 g/dL Final    Total Protein 04/13/2021 7.2  5.7 - 8.2 g/dL Final    Total Bilirubin 04/13/2021 0.8  0.3 - 1.2 mg/dL Final    AST 21/30/8657 11  <=34 U/L Final    ALT 04/13/2021 <7 (L)  10 - 49 U/L Final    Alkaline Phosphatase 04/13/2021 100  46 - 116 U/L Final    WBC 04/13/2021 2.2 (L)  3.6 - 11.2 10*9/L Final    RBC 04/13/2021 3.36 (L)  4.26 - 5.60 10*12/L Final    HGB 04/13/2021 8.8 (L)  12.9 - 16.5 g/dL Final    HCT 84/69/6295 28.4 (L)  39.0 - 48.0 % Final    MCV 04/13/2021 84.6  77.6 - 95.7 fL Final    MCH 04/13/2021 26.2  25.9 - 32.4 pg Final    MCHC 04/13/2021 30.9 (L)  32.0 - 36.0 g/dL Final    RDW 28/41/3244 24.5 (H)  12.2 - 15.2 % Final    MPV 04/13/2021 8.5  6.8 - 10.7 fL Final    Platelet 04/13/2021 359  150 - 450 10*9/L Final    Neutrophils % 04/13/2021 49.9  % Final    Lymphocytes % 04/13/2021 28.2  % Final    Monocytes % 04/13/2021 19.8  % Final    Eosinophils % 04/13/2021 0.5  % Final    Basophils % 04/13/2021 1.6  % Final    Absolute Neutrophils 04/13/2021 1.1 (L)  1.8 - 7.8 10*9/L Final    Absolute Lymphocytes 04/13/2021 0.6 (L)  1.1 - 3.6 10*9/L Final    Absolute Monocytes 04/13/2021 0.4  0.3 - 0.8 10*9/L Final    Absolute Eosinophils 04/13/2021 0.0  0.0 - 0.5 10*9/L Final    Absolute Basophils 04/13/2021 0.0  0.0 - 0.1 10*9/L Final    Anisocytosis 04/13/2021 Marked (A)  Not Present Final    Hypochromasia 04/13/2021 Slight (A)  Not Present Final

## 2021-04-18 ENCOUNTER — Ambulatory Visit: Payer: Medicare Other

## 2021-04-18 ENCOUNTER — Other Ambulatory Visit: Payer: Self-pay

## 2021-04-18 ENCOUNTER — Inpatient Hospital Stay: Payer: Medicare Other | Attending: Nurse Practitioner

## 2021-04-18 ENCOUNTER — Inpatient Hospital Stay (HOSPITAL_BASED_OUTPATIENT_CLINIC_OR_DEPARTMENT_OTHER): Payer: Medicare Other | Admitting: Oncology

## 2021-04-18 ENCOUNTER — Encounter: Payer: Self-pay | Admitting: Oncology

## 2021-04-18 ENCOUNTER — Inpatient Hospital Stay: Payer: Medicare Other

## 2021-04-18 VITALS — BP 124/60 | HR 98 | Resp 16

## 2021-04-18 VITALS — BP 125/79 | HR 102 | Temp 97.8°F | Resp 18 | Ht 74.0 in | Wt 302.0 lb

## 2021-04-18 DIAGNOSIS — D701 Agranulocytosis secondary to cancer chemotherapy: Secondary | ICD-10-CM

## 2021-04-18 DIAGNOSIS — R4701 Aphasia: Secondary | ICD-10-CM | POA: Diagnosis not present

## 2021-04-18 DIAGNOSIS — C9310 Chronic myelomonocytic leukemia not having achieved remission: Secondary | ICD-10-CM

## 2021-04-18 DIAGNOSIS — R197 Diarrhea, unspecified: Secondary | ICD-10-CM | POA: Diagnosis not present

## 2021-04-18 DIAGNOSIS — Z79899 Other long term (current) drug therapy: Secondary | ICD-10-CM | POA: Insufficient documentation

## 2021-04-18 DIAGNOSIS — R509 Fever, unspecified: Secondary | ICD-10-CM | POA: Diagnosis not present

## 2021-04-18 DIAGNOSIS — D509 Iron deficiency anemia, unspecified: Secondary | ICD-10-CM

## 2021-04-18 DIAGNOSIS — C9201 Acute myeloblastic leukemia, in remission: Secondary | ICD-10-CM

## 2021-04-18 DIAGNOSIS — T451X5A Adverse effect of antineoplastic and immunosuppressive drugs, initial encounter: Secondary | ICD-10-CM

## 2021-04-18 DIAGNOSIS — I10 Essential (primary) hypertension: Secondary | ICD-10-CM | POA: Diagnosis not present

## 2021-04-18 DIAGNOSIS — C9200 Acute myeloblastic leukemia, not having achieved remission: Secondary | ICD-10-CM | POA: Insufficient documentation

## 2021-04-18 DIAGNOSIS — Z5111 Encounter for antineoplastic chemotherapy: Secondary | ICD-10-CM

## 2021-04-18 LAB — CBC WITH DIFFERENTIAL/PLATELET
Abs Immature Granulocytes: 0 10*3/uL (ref 0.00–0.07)
Basophils Absolute: 0 10*3/uL (ref 0.0–0.1)
Basophils Relative: 3 %
Eosinophils Absolute: 0 10*3/uL (ref 0.0–0.5)
Eosinophils Relative: 2 %
HCT: 30.3 % — ABNORMAL LOW (ref 39.0–52.0)
Hemoglobin: 9 g/dL — ABNORMAL LOW (ref 13.0–17.0)
Immature Granulocytes: 0 %
Lymphocytes Relative: 45 %
Lymphs Abs: 0.4 10*3/uL — ABNORMAL LOW (ref 0.7–4.0)
MCH: 27 pg (ref 26.0–34.0)
MCHC: 29.7 g/dL — ABNORMAL LOW (ref 30.0–36.0)
MCV: 91 fL (ref 80.0–100.0)
Monocytes Absolute: 0.1 10*3/uL (ref 0.1–1.0)
Monocytes Relative: 7 %
Neutro Abs: 0.4 10*3/uL — CL (ref 1.7–7.7)
Neutrophils Relative %: 43 %
Platelets: 236 10*3/uL (ref 150–400)
RBC: 3.33 MIL/uL — ABNORMAL LOW (ref 4.22–5.81)
RDW: 23.7 % — ABNORMAL HIGH (ref 11.5–15.5)
WBC: 0.9 10*3/uL — CL (ref 4.0–10.5)
nRBC: 0 % (ref 0.0–0.2)

## 2021-04-18 LAB — COMPREHENSIVE METABOLIC PANEL
ALT: 7 U/L (ref 0–44)
AST: 10 U/L — ABNORMAL LOW (ref 15–41)
Albumin: 3.3 g/dL — ABNORMAL LOW (ref 3.5–5.0)
Alkaline Phosphatase: 77 U/L (ref 38–126)
Anion gap: 8 (ref 5–15)
BUN: 10 mg/dL (ref 8–23)
CO2: 27 mmol/L (ref 22–32)
Calcium: 8.4 mg/dL — ABNORMAL LOW (ref 8.9–10.3)
Chloride: 99 mmol/L (ref 98–111)
Creatinine, Ser: 0.69 mg/dL (ref 0.61–1.24)
GFR, Estimated: >60 mL/min (ref >60)
Glucose, Bld: 125 mg/dL — ABNORMAL HIGH (ref 70–99)
Potassium: 3.6 mmol/L (ref 3.5–5.1)
Sodium: 134 mmol/L — ABNORMAL LOW (ref 135–145)
Total Bilirubin: 0.4 mg/dL (ref 0.3–1.2)
Total Protein: 7 g/dL (ref 6.5–8.1)

## 2021-04-18 LAB — SAMPLE TO BLOOD BANK

## 2021-04-18 MED ORDER — SODIUM CHLORIDE 0.9 % IV SOLN
510.0000 mg | INTRAVENOUS | Status: DC
  Administered 2021-04-18: 510 mg via INTRAVENOUS
  Filled 2021-04-18: qty 510

## 2021-04-18 MED ORDER — SODIUM CHLORIDE 0.9 % IV SOLN
Freq: Once | INTRAVENOUS | Status: AC
  Filled 2021-04-18: qty 250

## 2021-04-18 NOTE — Progress Notes (Signed)
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  He has recently been having bilateral knee pain and has undergone steroid injection in his knee.Patient was noted to have a high white count on his routine exam.  His white count was 19.2 on 07/22/2018 with an H&H of 11.4/35.1 and a platelet count of 470.  At that point he was prescribed a course of antibiotics and a repeat blood work on 08/21/2018 showed white count of 42.3, H&H of 7.1/23 with an MCV of 84 and a platelet count of 791 and hence the patient was referred to hematology for further management.     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  In addition the core biopsy showed a small focus of fibrosis containing scattering of mast cells and eosinophils most suggestive of systemic mastocytosis.bone marrow blasts 7%.  Flow cytometry showed increased number of monocytic cells representing 16% of all cells with expression for HLA-DR, CD11b, CD13, CD14, CD33, CD38, CD56 and CD56 and CD64 with [facility] CD 117 are CD34. normal cytogenetics, CBL, SRSF2, SH2B3 and TET2    Repeat bone marrow biopsy after 4 cycles of Vidaza showed less pronounced monocytic/granulocytic proliferation and overall better granulopoiesis and no increase in blasts.  In addition features of systemic mastocytosis not present.    After 9 cycles of Vidaza patient was noted to have significant anemia and worsening thrombocytosis for which a repeat bone marrow biopsy was done.  Bone marrow did not show any features of progression of CMML.  Overall stable findings and no increased blast.He was found to have iron deficiency and received 2 doses of Feraheme.   Patient seen by [facility] clinic GI and underwent EGD and colonoscopy for iron deficiency anemia.Colonoscopy showed nonbleeding internal hemorrhoids.  Normal mucosa in the colon.  No polyps EGD showed normal esophagus and gastric mucosal atrophy.  No stigmata of bleeding   Patient presented to the ER in November 2022 with significant fatigue and found to have an anemia with a hemoglobin of 5 and significant hyperleukocytosis.  He was transferred to [facility] where it was confirmed that he had transformed to AML.  He was seen by Dr. Janene Madeira as an outpatient and started venetoclax plus Vidaza on 02/14/2021.  Bone marrow biopsy after cycle 1 showed morphologic remission    Interval history-reports doing well and denies any blood loss in his stool or urine.  Denies any dark melanotic stools  ECOG PS- 1 Pain scale- 0   Review of systems- Review of Systems  Constitutional:  Negative for chills, fever, malaise/fatigue and weight loss.  HENT:  Negative for congestion, ear discharge and nosebleeds.   Eyes:  Negative for blurred vision.  Respiratory:  Negative for cough, hemoptysis, sputum production, shortness of breath and wheezing.   Cardiovascular:  Negative for chest pain, palpitations, orthopnea and claudication.  Gastrointestinal:  Negative for abdominal pain, blood in stool, constipation, diarrhea, heartburn,  melena, nausea and vomiting.  Genitourinary:  Negative for dysuria, flank pain, frequency, hematuria and urgency.  Musculoskeletal:  Negative for back pain, joint pain and myalgias.  Skin:  Negative for rash.  Neurological:  Negative for dizziness, tingling, focal weakness,  seizures, weakness and headaches.  Endo/Heme/Allergies:  Does not bruise/bleed easily.  Psychiatric/Behavioral:  Negative for depression and suicidal ideas. The patient does not have insomnia.      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[place])    Dyspnea    Hypertension      Past Surgical History:  Procedure Laterality Date   COLONOSCOPY N/A 08/28/2019   Procedure: COLONOSCOPY;  Surgeon: Toledo, Benay Pike, MD;  Location: [facility] ENDOSCOPY;  Service: Gastroenterology;  Laterality: N/A;   COLONOSCOPY WITH PROPOFOL N/A 05/03/2015   Procedure: COLONOSCOPY WITH PROPOFOL;  Surgeon: [name], MD;  Location: [facility] ENDOSCOPY;  Service: Gastroenterology;  Laterality: N/A;   ESOPHAGOGASTRODUODENOSCOPY N/A 08/28/2019   Procedure: ESOPHAGOGASTRODUODENOSCOPY (EGD);  Surgeon: Toledo, Benay Pike, MD;  Location: [facility] ENDOSCOPY;  Service: Gastroenterology;  Laterality: N/A;   ESOPHAGOGASTRODUODENOSCOPY (EGD) WITH PROPOFOL N/A 05/03/2015   Procedure: ESOPHAGOGASTRODUODENOSCOPY (EGD) WITH PROPOFOL;  Surgeon: [name], MD;  Location: [facility] ENDOSCOPY;  Service: Gastroenterology;  Laterality: N/A;   HERNIA REPAIR     1975    Social History   Socioeconomic History   Marital status: Divorced    Spouse name: Not on file   Number of children: 4   Years of education: Not on file   Highest education level: Not on file  Occupational History   Occupation: retired    Comment: [employer]  Tobacco Use   Smoking status: Never   Smokeless tobacco: Never  Vaping Use   Vaping Use: Never used  Substance and Sexual Activity   Alcohol use: No   Drug use: No   Sexual activity: Not Currently  Other Topics Concern   Not on file  Social History Narrative   Not on file   Social Determinants of Health   Financial Resource Strain: Low Risk    Difficulty of Paying Living Expenses:  Not very hard  Food Insecurity: Not on file  Transportation Needs: Not on file  Physical Activity: Not on file  Stress: Not on file  Social Connections: Not on file  Intimate Partner Violence: Not on file    Family History  Problem Relation Age of Onset   Alzheimer's disease Father    Lung cancer Sister    Cancer Sister      Current Outpatient Medications:    allopurinol (ZYLOPRIM) 300 MG tablet, Take 1 tablet (300 mg total) by mouth daily., Disp: 90 tablet, Rfl: 2   ferrous sulfate 325 (65 FE) MG tablet, Take 325 mg by mouth daily., Disp: , Rfl:    furosemide (LASIX) 20 MG tablet, Take one tab po qd for swelling, Disp: 30 tablet, Rfl: 3   Melatonin 10 MG CAPS, Take by mouth daily as needed., Disp: , Rfl:    pantoprazole (PROTONIX) 40 MG tablet, TAKE 1 TABLET BY MOUTH EVERY DAY, Disp: 90 tablet, Rfl: 1   potassium chloride SA (KLOR-CON M20) 20 MEQ tablet, Take 1 tablet (20 mEq total) by mouth 2 (two) times daily., Disp: 180 tablet, Rfl: 1   prochlorperazine (COMPAZINE) 10 MG tablet, Take 10 mg by mouth every 6 (six) hours as needed.,  Disp: , Rfl:    tranexamic acid (LYSTEDA) 650 MG TABS tablet, Take 1,300 mg by mouth 3 (three) times daily., Disp: , Rfl:    traZODone (DESYREL) 50 MG tablet, TAKE 1 TABLET BY MOUTH EVERYDAY AT BEDTIME, Disp: 90 tablet, Rfl: 1   valACYclovir (VALTREX) 500 MG tablet, Take 1 tablet (500 mg total) by mouth daily., Disp: 90 tablet, Rfl: 1   venetoclax (VENCLEXTA) 100 MG tablet, Take 400 mg by mouth daily. Take for 21 days, then hold for 7 days. Repeat every 28 days., Disp: , Rfl:  No current facility-administered medications for this visit.  Facility-Administered Medications Ordered in Other Visits:    ferumoxytol (FERAHEME) 510 mg in sodium chloride 0.9 % 100 mL IVPB, 510 mg, Intravenous, Weekly, Sindy Guadeloupe, MD, Stopped at 04/18/21 1105  Physical exam:  Vitals:   04/18/21 0920  BP: 125/79  Pulse: (!) 102  Resp: 18  Temp: 97.8 F (36.6 C)   TempSrc: Tympanic  SpO2: 98%  Weight: (!) 302 lb (137 kg)  Height: '6\' 2"'  (1.88 m)   Physical Exam Constitutional:      General: He is not in acute distress. Cardiovascular:     Rate and Rhythm: Normal rate and regular rhythm.     Heart sounds: Normal heart sounds.  Pulmonary:     Effort: Pulmonary effort is normal.     Breath sounds: Normal breath sounds.  Abdominal:     General: Bowel sounds are normal.     Palpations: Abdomen is soft.  Skin:    General: Skin is warm and dry.  Neurological:     Mental Status: He is alert and oriented to person, place, and time.     CMP Latest Ref Rng & Units 04/18/2021  Glucose 70 - 99 mg/dL 125(H)  BUN 8 - 23 mg/dL 10  Creatinine 0.61 - 1.24 mg/dL 0.69  Sodium 135 - 145 mmol/L 134(L)  Potassium 3.5 - 5.1 mmol/L 3.6  Chloride 98 - 111 mmol/L 99  CO2 22 - 32 mmol/L 27  Calcium 8.9 - 10.3 mg/dL 8.4(L)  Total Protein 6.5 - 8.1 g/dL 7.0  Total Bilirubin 0.3 - 1.2 mg/dL 0.4  Alkaline Phos 38 - 126 U/L 77  AST 15 - 41 U/L 10(L)  ALT 0 - 44 U/L 7   CBC Latest Ref Rng & Units 04/18/2021  WBC 4.0 - 10.5 K/uL 0.9(LL)  Hemoglobin 13.0 - 17.0 g/dL 9.0(L)  Hematocrit 39.0 - 52.0 % 30.3(L)  Platelets 150 - 400 K/uL 236    Assessment and plan- Patient is a 81 y.o. 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  We will hold off on giving him Vidaza today.  We will move his treatment by 1 week.  Labs CBC with differential CMP and Vidaza day 1 today 7 labs starting next week and I will see him back 5 weeks from now for cycle 3 of Vidaza.  Patient is also on venetoclaxWhich she will continue at 400 mg daily 3 weeks on and 1 week off.  Also continue Valtrex prophylaxis.  Patient also follows up with Dr. Janene Madeira from [facility]  Iron deficiency anemia: Iron studies from 04/04/2021 showed a ferritin of22.  I will therefore proceed  with doses of Feraheme starting today   Visit Diagnosis 1. Chemotherapy induced neutropenia (HCC)   2. Iron deficiency anemia,  unspecified iron deficiency anemia type   3. Encounter for antineoplastic chemotherapy      Dr. Randa Evens, MD, MPH [facility] at [facility] [phone] 04/18/2021 3:34 PM

## 2021-04-19 ENCOUNTER — Inpatient Hospital Stay: Payer: Medicare Other

## 2021-04-19 ENCOUNTER — Encounter: Payer: Self-pay | Admitting: Oncology

## 2021-04-20 ENCOUNTER — Inpatient Hospital Stay: Payer: Medicare Other

## 2021-04-21 ENCOUNTER — Inpatient Hospital Stay: Payer: Medicare Other

## 2021-04-22 ENCOUNTER — Inpatient Hospital Stay: Payer: Medicare Other

## 2021-04-24 DIAGNOSIS — R000 Tachycardia, unspecified: Secondary | ICD-10-CM | POA: Diagnosis not present

## 2021-04-24 DIAGNOSIS — R69 Illness, unspecified: Secondary | ICD-10-CM | POA: Diagnosis not present

## 2021-04-25 ENCOUNTER — Encounter
Admit: 2021-04-25 | Discharge: 2021-04-28 | Disposition: A | Discharge status: Home / Self Care | Payer: MEDICARE | Attending: Emergency Medicine | Admitting: Internal Medicine

## 2021-04-25 ENCOUNTER — Ambulatory Visit
Admit: 2021-04-25 | Discharge: 2021-04-28 | Disposition: A | Discharge status: Home / Self Care | Payer: MEDICARE | Admitting: Internal Medicine

## 2021-04-25 ENCOUNTER — Inpatient Hospital Stay: Payer: Medicare Other

## 2021-04-25 ENCOUNTER — Inpatient Hospital Stay (HOSPITAL_BASED_OUTPATIENT_CLINIC_OR_DEPARTMENT_OTHER): Payer: Medicare Other | Admitting: Hospice and Palliative Medicine

## 2021-04-25 ENCOUNTER — Other Ambulatory Visit: Payer: Self-pay

## 2021-04-25 VITALS — BP 119/54 | HR 113 | Temp 100.7°F | Resp 17 | Wt 291.5 lb

## 2021-04-25 DIAGNOSIS — R5081 Fever presenting with conditions classified elsewhere: Secondary | ICD-10-CM | POA: Diagnosis not present

## 2021-04-25 DIAGNOSIS — I10 Essential (primary) hypertension: Secondary | ICD-10-CM | POA: Diagnosis not present

## 2021-04-25 DIAGNOSIS — D509 Iron deficiency anemia, unspecified: Secondary | ICD-10-CM

## 2021-04-25 DIAGNOSIS — R600 Localized edema: Secondary | ICD-10-CM | POA: Diagnosis not present

## 2021-04-25 DIAGNOSIS — C9201 Acute myeloblastic leukemia, in remission: Secondary | ICD-10-CM | POA: Diagnosis not present

## 2021-04-25 DIAGNOSIS — D849 Immunodeficiency, unspecified: Secondary | ICD-10-CM | POA: Diagnosis not present

## 2021-04-25 DIAGNOSIS — R918 Other nonspecific abnormal finding of lung field: Secondary | ICD-10-CM | POA: Diagnosis not present

## 2021-04-25 DIAGNOSIS — I2510 Atherosclerotic heart disease of native coronary artery without angina pectoris: Secondary | ICD-10-CM | POA: Diagnosis not present

## 2021-04-25 DIAGNOSIS — T451X5A Adverse effect of antineoplastic and immunosuppressive drugs, initial encounter: Secondary | ICD-10-CM

## 2021-04-25 DIAGNOSIS — R000 Tachycardia, unspecified: Secondary | ICD-10-CM | POA: Diagnosis not present

## 2021-04-25 DIAGNOSIS — C9200 Acute myeloblastic leukemia, not having achieved remission: Secondary | ICD-10-CM | POA: Diagnosis not present

## 2021-04-25 DIAGNOSIS — D61810 Antineoplastic chemotherapy induced pancytopenia: Secondary | ICD-10-CM | POA: Diagnosis not present

## 2021-04-25 DIAGNOSIS — D709 Neutropenia, unspecified: Secondary | ICD-10-CM | POA: Diagnosis not present

## 2021-04-25 DIAGNOSIS — R0602 Shortness of breath: Secondary | ICD-10-CM | POA: Diagnosis not present

## 2021-04-25 DIAGNOSIS — K219 Gastro-esophageal reflux disease without esophagitis: Secondary | ICD-10-CM | POA: Diagnosis not present

## 2021-04-25 DIAGNOSIS — R4701 Aphasia: Secondary | ICD-10-CM | POA: Diagnosis not present

## 2021-04-25 DIAGNOSIS — D701 Agranulocytosis secondary to cancer chemotherapy: Secondary | ICD-10-CM | POA: Diagnosis not present

## 2021-04-25 DIAGNOSIS — R509 Fever, unspecified: Secondary | ICD-10-CM | POA: Diagnosis not present

## 2021-04-25 DIAGNOSIS — Z9221 Personal history of antineoplastic chemotherapy: Secondary | ICD-10-CM | POA: Diagnosis not present

## 2021-04-25 DIAGNOSIS — Z20822 Contact with and (suspected) exposure to covid-19: Secondary | ICD-10-CM | POA: Diagnosis not present

## 2021-04-25 DIAGNOSIS — R197 Diarrhea, unspecified: Secondary | ICD-10-CM | POA: Diagnosis not present

## 2021-04-25 DIAGNOSIS — Z79899 Other long term (current) drug therapy: Secondary | ICD-10-CM | POA: Diagnosis not present

## 2021-04-25 DIAGNOSIS — C9310 Chronic myelomonocytic leukemia not having achieved remission: Secondary | ICD-10-CM

## 2021-04-25 LAB — URINALYSIS WITH MICROSCOPY
BACTERIA: NONE SEEN /HPF
BILIRUBIN UA: NEGATIVE
BLOOD UA: NEGATIVE
GLUCOSE UA: NEGATIVE
KETONES UA: NEGATIVE
LEUKOCYTE ESTERASE UA: NEGATIVE
NITRITE UA: NEGATIVE
PH UA: 5.5 (ref 5.0–9.0)
PROTEIN UA: 30 — AB
RBC UA: 3 /HPF (ref <=3)
SPECIFIC GRAVITY UA: 1.022 (ref 1.003–1.030)
SQUAMOUS EPITHELIAL: <1 /HPF (ref 0–5)
UROBILINOGEN UA: <2
WBC UA: 2 /HPF (ref <=2)

## 2021-04-25 LAB — CBC W/ AUTO DIFF
BASOPHILS ABSOLUTE COUNT: 0 10*9/L (ref 0.0–0.1)
BASOPHILS RELATIVE PERCENT: 1.2 %
EOSINOPHILS ABSOLUTE COUNT: 0 10*9/L (ref 0.0–0.5)
EOSINOPHILS RELATIVE PERCENT: 1.1 %
HEMATOCRIT: 25.9 % — ABNORMAL LOW (ref 39.0–48.0)
HEMOGLOBIN: 8.4 g/dL — ABNORMAL LOW (ref 12.9–16.5)
LYMPHOCYTES ABSOLUTE COUNT: 0.4 10*9/L — ABNORMAL LOW (ref 1.1–3.6)
LYMPHOCYTES RELATIVE PERCENT: 40.9 %
MEAN CORPUSCULAR HEMOGLOBIN CONC: 32.6 g/dL (ref 32.0–36.0)
MEAN CORPUSCULAR HEMOGLOBIN: 27.4 pg (ref 25.9–32.4)
MEAN CORPUSCULAR VOLUME: 84.1 fL (ref 77.6–95.7)
MEAN PLATELET VOLUME: 8.1 fL (ref 6.8–10.7)
MONOCYTES ABSOLUTE COUNT: 0.1 10*9/L — ABNORMAL LOW (ref 0.3–0.8)
MONOCYTES RELATIVE PERCENT: 12.6 %
NEUTROPHILS ABSOLUTE COUNT: 0.4 10*9/L — CL (ref 1.8–7.8)
NEUTROPHILS RELATIVE PERCENT: 44.2 %
PLATELET COUNT: 48 10*9/L — ABNORMAL LOW (ref 150–450)
RED BLOOD CELL COUNT: 3.08 10*12/L — ABNORMAL LOW (ref 4.26–5.60)
RED CELL DISTRIBUTION WIDTH: 24.3 % — ABNORMAL HIGH (ref 12.2–15.2)
WBC ADJUSTED: 0.9 10*9/L — ABNORMAL LOW (ref 3.6–11.2)

## 2021-04-25 LAB — SLIDE REVIEW

## 2021-04-25 LAB — COMPREHENSIVE METABOLIC PANEL
ALBUMIN: 3.1 g/dL — ABNORMAL LOW (ref 3.4–5.0)
ALKALINE PHOSPHATASE: 73 U/L (ref 46–116)
ALT (SGPT): 17 U/L (ref 10–49)
ANION GAP: 9 mmol/L (ref 5–14)
AST (SGOT): 25 U/L (ref <=34)
BILIRUBIN TOTAL: 0.4 mg/dL (ref 0.3–1.2)
BLOOD UREA NITROGEN: 8 mg/dL — ABNORMAL LOW (ref 9–23)
BUN / CREAT RATIO: 11
CALCIUM: 9 mg/dL (ref 8.7–10.4)
CHLORIDE: 103 mmol/L (ref 98–107)
CO2: 24 mmol/L (ref 20.0–31.0)
CREATININE: 0.76 mg/dL
EGFR CKD-EPI (2021) MALE: 90 mL/min/{1.73_m2} (ref >=60)
GLUCOSE RANDOM: 113 mg/dL (ref 70–179)
POTASSIUM: 4 mmol/L (ref 3.4–4.8)
PROTEIN TOTAL: 7.1 g/dL (ref 5.7–8.2)
SODIUM: 136 mmol/L (ref 135–145)

## 2021-04-25 LAB — HIGH SENSITIVITY TROPONIN I - SINGLE: HIGH SENSITIVITY TROPONIN I: 5 ng/L (ref <=53)

## 2021-04-25 LAB — LIPASE: LIPASE: 24 U/L (ref 12–53)

## 2021-04-25 LAB — LACTATE, VENOUS, WHOLE BLOOD: LACTATE BLOOD VENOUS: 1.2 mmol/L (ref 0.5–1.8)

## 2021-04-25 LAB — CBC WITH DIFFERENTIAL/PLATELET
Abs Immature Granulocytes: 0.01 10*3/uL (ref 0.00–0.07)
Basophils Absolute: 0 10*3/uL (ref 0.0–0.1)
Basophils Relative: 1 %
Eosinophils Absolute: 0 10*3/uL (ref 0.0–0.5)
Eosinophils Relative: 1 %
HCT: 28.2 % — ABNORMAL LOW (ref 39.0–52.0)
Hemoglobin: 8.7 g/dL — ABNORMAL LOW (ref 13.0–17.0)
Immature Granulocytes: 1 %
Lymphocytes Relative: 48 %
Lymphs Abs: 0.4 10*3/uL — ABNORMAL LOW (ref 0.7–4.0)
MCH: 27.2 pg (ref 26.0–34.0)
MCHC: 30.9 g/dL (ref 30.0–36.0)
MCV: 88.1 fL (ref 80.0–100.0)
Monocytes Absolute: 0.1 10*3/uL (ref 0.1–1.0)
Monocytes Relative: 10 %
Neutro Abs: 0.4 10*3/uL — CL (ref 1.7–7.7)
Neutrophils Relative %: 39 %
Platelets: 64 10*3/uL — ABNORMAL LOW (ref 150–400)
RBC: 3.2 MIL/uL — ABNORMAL LOW (ref 4.22–5.81)
RDW: 23.1 % — ABNORMAL HIGH (ref 11.5–15.5)
Smear Review: NORMAL
WBC: 0.9 10*3/uL — CL (ref 4.0–10.5)
nRBC: 0 % (ref 0.0–0.2)

## 2021-04-25 LAB — COMPREHENSIVE METABOLIC PANEL WITH GFR
ALT: 18 U/L (ref 0–44)
AST: 24 U/L (ref 15–41)
Albumin: 3.1 g/dL — ABNORMAL LOW (ref 3.5–5.0)
Alkaline Phosphatase: 67 U/L (ref 38–126)
Anion gap: 6 (ref 5–15)
BUN: 8 mg/dL (ref 8–23)
CO2: 23 mmol/L (ref 22–32)
Calcium: 8.2 mg/dL — ABNORMAL LOW (ref 8.9–10.3)
Chloride: 103 mmol/L (ref 98–111)
Creatinine, Ser: 0.76 mg/dL (ref 0.61–1.24)
GFR, Estimated: >60 mL/min
Glucose, Bld: 117 mg/dL — ABNORMAL HIGH (ref 70–99)
Potassium: 3.4 mmol/L — ABNORMAL LOW (ref 3.5–5.1)
Sodium: 132 mmol/L — ABNORMAL LOW (ref 135–145)
Total Bilirubin: 0.4 mg/dL (ref 0.3–1.2)
Total Protein: 7.3 g/dL (ref 6.5–8.1)

## 2021-04-25 LAB — SAMPLE TO BLOOD BANK

## 2021-04-25 MED ADMIN — cefepime (MAXIPIME) 2 g in sodium chloride 0.9 % (NS) 100 mL IVPB-connector bag: 2 g | INTRAVENOUS | @ 23:00:00 | Stop: 2021-04-25

## 2021-04-25 MED ADMIN — vancomycin (VANCOCIN) 2000 mg IVPB in 500 mL sodium chloride 0.9% (premix): 2000 mg | INTRAVENOUS | @ 23:00:00 | Stop: 2021-04-25

## 2021-04-25 NOTE — Telephone Encounter (Signed)
Pt was seen for [facility] clinic and then after this , he wanted to go to [facility] ER

## 2021-04-25 NOTE — Progress Notes (Signed)
Pt scheduled for treatment today, but EMS was called yesterday due to pt having an episode where he was aphasic. Pt sated that he suddly lost the ability to speak for about 2-3 minutes. EMS assessed the pt but pt did not go to the hospital. Pt denies any other symptoms during this incident, and states that speech returned within 2-3 minutes.

## 2021-04-25 NOTE — Progress Notes (Signed)
Symptom Management [place] at [facility] [phone] [phone]  Patient Care Team: Lavera Guise, MD as PCP - General (Internal Medicine) Sindy Guadeloupe, MD as Consulting Physician (Oncology) Edythe Clarity, [facility] as Pharmacist (Pharmacist)   Name of the patient: Samuel Frank  [id number]  November 01, 1940   Date of visit: 04/25/21  Reason for Consult: Samuel Frank. is a 81 y.o. male with multiple medical problems history of CMML now with secondary AML on treatment with Vidaza and venetoclax.  Patient saw Dr. Janese Frank on 04/18/2021 for consideration of cycle 2 Vidaza and was found to have leukopenia so treatment was held x1 week.  Patient presents to [facility] today for follow-up.  Patient did receive dose of Feraheme.  Patient reports an episode of aphasia yesterday that was transient and lasted just a couple of minutes.  He says that he heard the phone ring and his wife was trying to communicate with him but he could not form any words to respond.  They called EMS but by the time EMS arrived the episode had resolved and he was able to speak fully and communicate without issues.  He declined transport to the ER.  Patient also reports he had some nausea and an episode of diarrhea yesterday but this resolved.  He denies fever or chills but was noted to be febrile in clinic.  Denies any neurologic complaints at present. Denies recent fevers or illnesses. Denies any easy bleeding or bruising. Reports good appetite and denies weight loss. Denies chest pain. Denies any nausea, vomiting, constipation, or diarrhea. Denies urinary complaints. Patient offers no further specific complaints today.  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[place])    Dyspnea    Hypertension     PAST SURGICAL HISTORY:  Past Surgical History:  Procedure  Laterality Date   COLONOSCOPY N/A 08/28/2019   Procedure: COLONOSCOPY;  Surgeon: Toledo, Benay Pike, MD;  Location: [facility] ENDOSCOPY;  Service: Gastroenterology;  Laterality: N/A;   COLONOSCOPY WITH PROPOFOL N/A 05/03/2015   Procedure: COLONOSCOPY WITH PROPOFOL;  Surgeon: [name], MD;  Location: [facility] ENDOSCOPY;  Service: Gastroenterology;  Laterality: N/A;   ESOPHAGOGASTRODUODENOSCOPY N/A 08/28/2019   Procedure: ESOPHAGOGASTRODUODENOSCOPY (EGD);  Surgeon: Toledo, Benay Pike, MD;  Location: [facility] ENDOSCOPY;  Service: Gastroenterology;  Laterality: N/A;   ESOPHAGOGASTRODUODENOSCOPY (EGD) WITH PROPOFOL N/A 05/03/2015   Procedure: ESOPHAGOGASTRODUODENOSCOPY (EGD) WITH PROPOFOL;  Surgeon: [name], MD;  Location: [facility] ENDOSCOPY;  Service: Gastroenterology;  Laterality: N/A;   HERNIA REPAIR     1975    HEMATOLOGY/ONCOLOGY HISTORY:  Oncology History  Chronic myelomonocytic leukemia not having achieved remission ([place])  09/13/2018 Initial Diagnosis   Chronic myelomonocytic leukemia not having achieved remission ([place])   09/30/2018 -  Chemotherapy   Patient is on Treatment Plan : CMML Azacitidine SQ D1-7 q28d       ALLERGIES:  has No Known Allergies.  MEDICATIONS:  Current Outpatient Medications  Medication Sig Dispense Refill   allopurinol (ZYLOPRIM) 300 MG tablet Take 1 tablet (300 mg total) by mouth daily. 90 tablet 2   ferrous sulfate 325 (65 FE) MG tablet Take 325 mg by mouth daily.     furosemide (LASIX) 20 MG tablet Take one tab po qd for swelling 30 tablet 3   Melatonin 10 MG CAPS Take by mouth daily as needed.  pantoprazole (PROTONIX) 40 MG tablet TAKE 1 TABLET BY MOUTH EVERY DAY 90 tablet 1   potassium chloride SA (KLOR-CON M20) 20 MEQ tablet Take 1 tablet (20 mEq total) by mouth 2 (two) times daily. 180 tablet 1   prochlorperazine (COMPAZINE) 10 MG tablet Take 10 mg by mouth every 6 (six) hours as needed.     tranexamic acid (LYSTEDA) 650 MG TABS tablet Take 1,300 mg by mouth 3 (three)  times daily.     traZODone (DESYREL) 50 MG tablet TAKE 1 TABLET BY MOUTH EVERYDAY AT BEDTIME 90 tablet 1   valACYclovir (VALTREX) 500 MG tablet Take 1 tablet (500 mg total) by mouth daily. 90 tablet 1   venetoclax (VENCLEXTA) 100 MG tablet Take 400 mg by mouth daily. Take for 21 days, then hold for 7 days. Repeat every 28 days.     No current facility-administered medications for this visit.    VITAL SIGNS: There were no vitals taken for this visit. There were no vitals filed for this visit.  Estimated body mass index is 38.77 kg/m as calculated from the following:   Height as of 04/18/21: 6\' 2"  (1.88 m).   Weight as of 04/18/21: 302 lb (137 kg).  LABS: CBC:    Component Value Date/Time   WBC 0.9 (LL) 04/18/2021 0906   HGB 9.0 (L) 04/18/2021 0906   HGB 7.1 (L) 08/21/2018 0851   HCT 30.3 (L) 04/18/2021 0906   HCT 23.0 (L) 08/21/2018 0851   PLT 236 04/18/2021 0906   PLT 791 (H) 08/21/2018 0851   MCV 91.0 04/18/2021 0906   MCV 84 08/21/2018 0851   NEUTROABS 0.4 (LL) 04/18/2021 0906   LYMPHSABS 0.4 (L) 04/18/2021 0906   MONOABS 0.1 04/18/2021 0906   EOSABS 0.0 04/18/2021 0906   BASOSABS 0.0 04/18/2021 0906   Comprehensive Metabolic Panel:    Component Value Date/Time   NA 134 (L) 04/18/2021 0906   NA 136 07/22/2018 0811   K 3.6 04/18/2021 0906   CL 99 04/18/2021 0906   CO2 27 04/18/2021 0906   BUN 10 04/18/2021 0906   BUN 16 07/22/2018 0811   CREATININE 0.69 04/18/2021 0906   GLUCOSE 125 (H) 04/18/2021 0906   CALCIUM 8.4 (L) 04/18/2021 0906   AST 10 (L) 04/18/2021 0906   ALT 7 04/18/2021 0906   ALKPHOS 77 04/18/2021 0906   BILITOT 0.4 04/18/2021 0906   BILITOT 0.3 07/22/2018 0811   PROT 7.0 04/18/2021 0906   PROT 7.4 07/22/2018 0811   ALBUMIN 3.3 (L) 04/18/2021 0906   ALBUMIN 4.1 07/22/2018 0811    RADIOGRAPHIC STUDIES: No results found.  PERFORMANCE STATUS (ECOG) : 2 - Symptomatic, <50% confined to bed  Review of Systems Unless otherwise noted, a complete  review of systems is negative.  Physical Exam General: NAD Cardiovascular: regular rate and rhythm Pulmonary: clear anterior/posterior fields Abdomen: soft, nontender, + bowel sounds GU: no suprapubic tenderness Extremities: no edema, no joint deformities Skin: no rashes Neurological: Weakness but otherwise nonfocal  Assessment and Plan- Patient is a 81 y.o. 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  Patient was found to be febrile in clinic with tympanic temperature of 100.7.  He currently denies any symptomatic complaints although had an episode of aphasia, nausea, and diarrhea yesterday.  Discussed with Dr. Janese Frank who recommended hospitalization for work-up and treatment of neutropenic fever.  Discussed with patient and daughter who are both in agreement with hospitalization.  They would prefer [facility] and daughter plans to transport him directly to [facility] from our clinic.  They verbalized understanding of the importance of starting IV antibiotics as soon as possible.  Dr. Janese Frank has been in communication with with Dr. Janene Madeira.     Patient expressed understanding and was in agreement with this plan. He also understands that He can call clinic at any time with any questions, concerns, or complaints.   Thank you for allowing me to participate in the care of this very pleasant patient.   Time Total: 20 minutes  Visit consisted of counseling and education dealing with the complex and emotionally intense issues of symptom management in the setting of serious illness.Greater than 50%  of this time was spent counseling and coordinating care related to the above assessment and plan.  Signed by: Altha Harm, PhD, NP-C

## 2021-04-25 NOTE — Unmapped (Signed)
Pt having fever and currently on chemo. Sent for abx

## 2021-04-26 ENCOUNTER — Inpatient Hospital Stay: Payer: Medicare Other

## 2021-04-26 DIAGNOSIS — D709 Neutropenia, unspecified: Secondary | ICD-10-CM | POA: Diagnosis not present

## 2021-04-26 DIAGNOSIS — R918 Other nonspecific abnormal finding of lung field: Secondary | ICD-10-CM | POA: Diagnosis not present

## 2021-04-26 DIAGNOSIS — R5081 Fever presenting with conditions classified elsewhere: Secondary | ICD-10-CM | POA: Diagnosis not present

## 2021-04-26 LAB — CBC W/ AUTO DIFF
BASOPHILS ABSOLUTE COUNT: 0 10*9/L (ref 0.0–0.1)
BASOPHILS ABSOLUTE COUNT: 0 10*9/L (ref 0.0–0.1)
BASOPHILS RELATIVE PERCENT: 0.8 %
BASOPHILS RELATIVE PERCENT: 0.7 %
EOSINOPHILS ABSOLUTE COUNT: 0 10*9/L (ref 0.0–0.5)
EOSINOPHILS ABSOLUTE COUNT: 0 10*9/L (ref 0.0–0.5)
EOSINOPHILS RELATIVE PERCENT: 0.8 %
EOSINOPHILS RELATIVE PERCENT: 1.6 %
HEMATOCRIT: 23.1 % — ABNORMAL LOW (ref 39.0–48.0)
HEMATOCRIT: 22.2 % — ABNORMAL LOW (ref 39.0–48.0)
HEMOGLOBIN: 7.6 g/dL — ABNORMAL LOW (ref 12.9–16.5)
HEMOGLOBIN: 7.4 g/dL — ABNORMAL LOW (ref 12.9–16.5)
LYMPHOCYTES ABSOLUTE COUNT: 0.3 10*9/L — ABNORMAL LOW (ref 1.1–3.6)
LYMPHOCYTES ABSOLUTE COUNT: 0.3 10*9/L — ABNORMAL LOW (ref 1.1–3.6)
LYMPHOCYTES RELATIVE PERCENT: 39.4 %
LYMPHOCYTES RELATIVE PERCENT: 31.3 %
MEAN CORPUSCULAR HEMOGLOBIN CONC: 32.9 g/dL (ref 32.0–36.0)
MEAN CORPUSCULAR HEMOGLOBIN CONC: 33.4 g/dL (ref 32.0–36.0)
MEAN CORPUSCULAR HEMOGLOBIN: 27.6 pg (ref 25.9–32.4)
MEAN CORPUSCULAR HEMOGLOBIN: 27.7 pg (ref 25.9–32.4)
MEAN CORPUSCULAR VOLUME: 83.9 fL (ref 77.6–95.7)
MEAN CORPUSCULAR VOLUME: 83.1 fL (ref 77.6–95.7)
MEAN PLATELET VOLUME: 8.3 fL (ref 6.8–10.7)
MEAN PLATELET VOLUME: 8.2 fL (ref 6.8–10.7)
MONOCYTES ABSOLUTE COUNT: 0.1 10*9/L — ABNORMAL LOW (ref 0.3–0.8)
MONOCYTES ABSOLUTE COUNT: 0.2 10*9/L — ABNORMAL LOW (ref 0.3–0.8)
MONOCYTES RELATIVE PERCENT: 12.8 %
MONOCYTES RELATIVE PERCENT: 16.2 %
NEUTROPHILS ABSOLUTE COUNT: 0.4 10*9/L — CL (ref 1.8–7.8)
NEUTROPHILS ABSOLUTE COUNT: 0.5 10*9/L — ABNORMAL LOW (ref 1.8–7.8)
NEUTROPHILS RELATIVE PERCENT: 46.2 %
NEUTROPHILS RELATIVE PERCENT: 50.2 %
PLATELET COUNT: 51 10*9/L — ABNORMAL LOW (ref 150–450)
PLATELET COUNT: 46 10*9/L — ABNORMAL LOW (ref 150–450)
RED BLOOD CELL COUNT: 2.75 10*12/L — ABNORMAL LOW (ref 4.26–5.60)
RED BLOOD CELL COUNT: 2.68 10*12/L — ABNORMAL LOW (ref 4.26–5.60)
RED CELL DISTRIBUTION WIDTH: 23.7 % — ABNORMAL HIGH (ref 12.2–15.2)
RED CELL DISTRIBUTION WIDTH: 24 % — ABNORMAL HIGH (ref 12.2–15.2)
WBC ADJUSTED: 0.8 10*9/L — ABNORMAL LOW (ref 3.6–11.2)
WBC ADJUSTED: 1 10*9/L — ABNORMAL LOW (ref 3.6–11.2)

## 2021-04-26 LAB — BASIC METABOLIC PANEL
ANION GAP: 8 mmol/L (ref 5–14)
ANION GAP: 9 mmol/L (ref 5–14)
BLOOD UREA NITROGEN: 8 mg/dL — ABNORMAL LOW (ref 9–23)
BLOOD UREA NITROGEN: 6 mg/dL — ABNORMAL LOW (ref 9–23)
BUN / CREAT RATIO: 11
BUN / CREAT RATIO: 8
CALCIUM: 8.4 mg/dL — ABNORMAL LOW (ref 8.7–10.4)
CALCIUM: 8.3 mg/dL — ABNORMAL LOW (ref 8.7–10.4)
CHLORIDE: 105 mmol/L (ref 98–107)
CHLORIDE: 103 mmol/L (ref 98–107)
CO2: 25 mmol/L (ref 20.0–31.0)
CO2: 25 mmol/L (ref 20.0–31.0)
CREATININE: 0.74 mg/dL
CREATININE: 0.72 mg/dL
EGFR CKD-EPI (2021) MALE: >90 mL/min/{1.73_m2} (ref >=60)
EGFR CKD-EPI (2021) MALE: >90 mL/min/{1.73_m2} (ref >=60)
GLUCOSE RANDOM: 121 mg/dL (ref 70–179)
GLUCOSE RANDOM: 103 mg/dL (ref 70–179)
POTASSIUM: 3.6 mmol/L (ref 3.4–4.8)
POTASSIUM: 3.4 mmol/L (ref 3.4–4.8)
SODIUM: 138 mmol/L (ref 135–145)
SODIUM: 137 mmol/L (ref 135–145)

## 2021-04-26 LAB — SLIDE REVIEW

## 2021-04-26 LAB — PROTIME-INR
INR: 1.34
INR: 1.35
PROTIME: 15.3 s — ABNORMAL HIGH (ref 9.8–12.8)
PROTIME: 15.5 s — ABNORMAL HIGH (ref 9.8–12.8)

## 2021-04-26 LAB — HEPATIC FUNCTION PANEL
ALBUMIN: 2.6 g/dL — ABNORMAL LOW (ref 3.4–5.0)
ALBUMIN: 2.6 g/dL — ABNORMAL LOW (ref 3.4–5.0)
ALKALINE PHOSPHATASE: 62 U/L (ref 46–116)
ALKALINE PHOSPHATASE: 59 U/L (ref 46–116)
ALT (SGPT): 15 U/L (ref 10–49)
ALT (SGPT): 16 U/L (ref 10–49)
AST (SGOT): 20 U/L (ref <=34)
AST (SGOT): 18 U/L (ref <=34)
BILIRUBIN DIRECT: 0.2 mg/dL (ref 0.00–0.30)
BILIRUBIN DIRECT: 0.3 mg/dL (ref 0.00–0.30)
BILIRUBIN TOTAL: 0.5 mg/dL (ref 0.3–1.2)
BILIRUBIN TOTAL: 0.6 mg/dL (ref 0.3–1.2)
PROTEIN TOTAL: 6.2 g/dL (ref 5.7–8.2)
PROTEIN TOTAL: 6 g/dL (ref 5.7–8.2)

## 2021-04-26 LAB — D-DIMER, QUANTITATIVE
D-DIMER QUANTITATIVE (CW,ML,HL,HS,CH,JS,JC,RX): 1255 ng{FEU}/mL — ABNORMAL HIGH (ref <=500)
D-DIMER QUANTITATIVE (CW,ML,HL,HS,CH,JS,JC,RX): 1158 ng{FEU}/mL — ABNORMAL HIGH (ref <=500)

## 2021-04-26 LAB — PHOSPHORUS
PHOSPHORUS: 2.9 mg/dL (ref 2.4–5.1)
PHOSPHORUS: 3.1 mg/dL (ref 2.4–5.1)

## 2021-04-26 LAB — MAGNESIUM
MAGNESIUM: 1.7 mg/dL (ref 1.6–2.6)
MAGNESIUM: 1.6 mg/dL (ref 1.6–2.6)

## 2021-04-26 LAB — APTT
APTT: 28.4 s (ref 25.1–36.5)
APTT: 29 s (ref 25.1–36.5)
HEPARIN CORRELATION: 0.2
HEPARIN CORRELATION: 0.2

## 2021-04-26 LAB — FIBRINOGEN
FIBRINOGEN LEVEL: 526 mg/dL — ABNORMAL HIGH (ref 175–500)
FIBRINOGEN LEVEL: 503 mg/dL — ABNORMAL HIGH (ref 175–500)

## 2021-04-26 MED ADMIN — cefepime (MAXIPIME) 2 g in sodium chloride 0.9 % (NS) 100 mL IVPB-connector bag: 2 g | INTRAVENOUS | @ 07:00:00 | Stop: 2021-05-03

## 2021-04-26 MED ADMIN — vancomycin (VANCOCIN) 1500 mg in sodium chloride (NS) 0.9 % 500 mL IVPB (premix): 1500 mg | INTRAVENOUS | @ 12:00:00 | Stop: 2021-04-26

## 2021-04-26 MED ADMIN — cefepime (MAXIPIME) 2 g in sodium chloride 0.9 % (NS) 100 mL IVPB-connector bag: 2 g | INTRAVENOUS | @ 16:00:00 | Stop: 2021-05-03

## 2021-04-26 MED ADMIN — allopurinol (ZYLOPRIM) tablet 300 mg: 300 mg | ORAL | @ 14:00:00

## 2021-04-26 MED ADMIN — pantoprazole (PROTONIX) EC tablet 40 mg: 40 mg | ORAL | @ 05:00:00

## 2021-04-26 MED ADMIN — pantoprazole (PROTONIX) EC tablet 40 mg: 40 mg | ORAL | @ 14:00:00

## 2021-04-26 MED ADMIN — allopurinol (ZYLOPRIM) tablet 300 mg: 300 mg | ORAL | @ 05:00:00

## 2021-04-26 MED ADMIN — valACYclovir (VALTREX) tablet 500 mg: 500 mg | ORAL | @ 14:00:00

## 2021-04-26 MED ADMIN — ondansetron (ZOFRAN) tablet 8 mg: 8 mg | ORAL | @ 20:00:00

## 2021-04-26 MED ADMIN — melatonin tablet 3 mg: 3 mg | ORAL | @ 05:00:00

## 2021-04-26 MED ADMIN — potassium chloride CR tablet 20 mEq: 20 meq | ORAL | @ 19:00:00 | Stop: 2021-04-26

## 2021-04-26 MED ADMIN — cefepime (MAXIPIME) 2 g in sodium chloride 0.9 % (NS) 100 mL IVPB-connector bag: 2 g | INTRAVENOUS | @ 23:00:00 | Stop: 2021-05-03

## 2021-04-26 MED ADMIN — sodium chloride (NS) 0.9 % infusion: 20 mL/h | INTRAVENOUS | @ 05:00:00

## 2021-04-26 NOTE — Unmapped (Signed)
Problem: Adult Inpatient Plan of Care  Goal: Plan of Care Review  Outcome: Progressing  Flowsheets (Taken 04/26/2021 0552)  Progress: no change  Plan of Care Reviewed With: patient  Note:   .    Goal: Patient-Specific Goal (Individualized)  Outcome: Progressing  Flowsheets (Taken 04/26/2021 0552)  Patient-Specific Goals (Include Timeframe): pt will remain afebrile  Individualized Care Needs: care clustered, admission condensced, breakfast order taken  Anxieties, Fears or Concerns: none shared  Goal: Absence of Hospital-Acquired Illness or Injury  Outcome: Progressing  Goal: Optimal Comfort and Wellbeing  Outcome: Progressing  Intervention: Provide Person-Centered Care  Flowsheets (Taken 04/26/2021 0552)  Trust Relationship/Rapport:  ??? care explained  ??? choices provided  ??? emotional support provided  ??? empathic listening provided  ??? questions answered  ??? questions encouraged  ??? reassurance provided  ??? thoughts/feelings acknowledged     Problem: Self-Care Deficit  Goal: Improved Ability to Complete Activities of Daily Living  Outcome: Progressing

## 2021-04-26 NOTE — Unmapped (Signed)
[facility]  Emergency Department Provider Note      ED Clinical Impression      Final diagnoses:   Neutropenic fever (CMS-HCC) (Primary)            Impression, Medical Decision Making, Progress Notes and Critical Care      Impression, Differential Diagnosis and Plan of Care    Gary Baird is a 81 y.o. 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  Had temp of 100.7 today at oncology clinic.     On exam, nontoxic in appearance, obese.  Tachycardic at 116 with regular rhythm.  Lungs CTAB.  Abdomen soft, nontender nondistended.  No suprapubic tenderness.    DDx/plan: Suspected neutropenic fever of unclear source.  He is having no respiratory or urinary complaints.  We will check full septic work-up including blood cultures, chest x-ray, urine/urine culture.  Additionally, last night the patient reports he was shaking and shivering while sitting on the couch last night.  Unclear if these were rigors.  He has had no recent sick contacts.  Given his immunosuppression on chemotherapy (last dose 2 weeks ago), we will cover with broad-spectrum antibiotics.  ED course below.    Independent Interpretation of Studies    I have independently interpreted the following studies:  ??? EKG: Sinus tachycardia, rate 108, normal axis, normal intervals, no ST elevation.  ??? X-ray(s): No airspace opacities    Considerations Regarding Disposition/Escalation of Care and Critical Care    ??? Indications for observation/admission (or consideration of observation/admission) and/or appropriateness for outpatient management: Admit for neutropenic fever  ??? Patient/Family/Caregiver Discussions: Discussed this plan with wife at bedside    Additional Progress Notes    ED Course as of 04/25/21 1928   Mon Apr 25, 2021 1701 Chest x-ray interpreted by me: No pneumonia.   1702 EKG interpreted me: Sinus tachycardia, rate 108, normal axis, normal intervals, no ST elevation or depression.   1918 Patient's fever and CBC concerning for neutropenic fever.  Lactate 1.2.  Normal renal function.  MAO has been paged for admission.  Patient has been antibiosis with vancomycin and cefepime.   1918 Absolute Neutrophils(!!): 0.4   1918 WBC(!): 0.9         Portions of this record have been created using Scientist, clinical (histocompatibility and immunogenetics). Dictation errors have been sought, but may not have been identified and corrected.    See chart and resident provider documentation for details.    ____________________________________________         History        Reason for Visit  Fever Immunocompromised      HPI   Gary Baird is a 81 y.o. 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  He states that he had been his previous state of health until last night he began having a shaking/shivering episode while sitting on the couch talking to his wife.  He states he could hear his wife talking to him but for a moment he felt like he was unable to respond.  There was no accompanying facial droop, slurred speech, unilateral numbness or  weakness.  His transient episode resolved, but paramedics were called out to the house.  Patient declined to present to the ED at that time.  He then went to his  oncology clinic earlier today where he was noted to have a temperature of 100.7 with WBC of 0.9.  He was referred to the ED for IV antibiotics and admission.  As an aside, he complains of vague abdominal cramping today with an isolated episode of NBNB emesis yesterday.  His abdominal cramping resolved after one-time dose of Pepto-Bismol at home.  He denies any chest pain, shortness of breath, vomiting, diarrhea. 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     Outside Historian(s)  (EMS, Significant Other, Family, Parent, Caregiver, Friend, Law Enforcement, etc.)    - wife    External Records Reviewed  (Inpatient/Outpatient notes, Prior labs/imaging studies, Care Everywhere, PDMP, External ED notes, etc)    03/20/21 Discharge Summary: Patient was admitted for melena and fatigue in setting of chronic occult GI bleeding.  Capsule endoscopy/enteroscopy unrevealing.  While inpatient he developed postprocedural hypoxia/fever of unknown etiology and was treated with IV antibiotics.  Was discharged home on oral course of Cipro and Flagyl.    Past Medical History:   Diagnosis Date   ??? Cancer (CMS-HCC)    ??? Hypertension        Patient Active Problem List   Diagnosis   ??? CMML (chronic myelomonocytic leukemia) (CMS-HCC)   ??? Hemorrhagic shock (CMS-HCC)   ??? Vasovagal syncope   ??? Hypertension   ??? Coronary artery disease without angina pectoris   ??? AKI (acute kidney injury) (CMS-HCC)   ??? Hematochezia   ??? Melena   ??? Hemorrhoid   ??? Anemia   ??? 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 Thrombocytosis   ??? GI bleed       Past Surgical History:   Procedure Laterality Date   ??? HERNIA REPAIR  1975   ??? PR COLONOSCOPY FLX DX W/COLLJ SPEC WHEN PFRMD N/A 02/02/2021    Procedure: COLONOSCOPY, FLEXIBLE, PROXIMAL TO SPLENIC FLEXURE; DIAGNOSTIC, W/WO COLLECTION SPECIMEN BY BRUSH OR WASH;  Surgeon: Mayford Knife, MD;  Location: [facility];  Service: Gastroenterology   ??? PR ENDOSCOPY UPPER SMALL INTESTINE N/A 03/17/2021    Procedure: SMALL INTESTINAL ENDOSCOPY, ENTEROSCOPY BEYOND SECOND PORTION OF DUODENUM, NOT INCL ILEUM; DX, INCL COLLECTION OF SPECIMEN(S) BY BRUSHING OR WASHING, WHEN PERFORMED;  Surgeon: Vonda Antigua, MD;  Location: [facility];  Service: Gastroenterology   ??? PR GASTROINTESTINAL TRACT IMAGING ESOPHAGUS W/I&R N/A 02/02/2021    Procedure: GI PATENCY TRACT IMAGING, INTRALUMINAL (EG, CAPSULE ENDOSCOPY), ESOPHAGUS W/PHYSICIAN INTERP/REPORT;  Surgeon: Mayford Knife, MD;  Location: [facility];  Service: Gastroenterology   ??? PR GI IMAG INTRALUMINAL ESOPHAGUS-ILEUM W/I&R N/A 03/15/2021    Procedure: GI VIDEO TRACT IMAGE INTRALUMINAL (EG, CAPSULE ENDOSCOPY), ESOPHAGUS VIA ILEUM, PHYSICIAN INTERPRETATION & REPORT;  Surgeon: Monte Fantasia, MD;  Location: [facility];  Service: Gastroenterology   ??? PR UPPER GI ENDOSCOPY,DIAGNOSIS N/A 02/02/2021    Procedure: UGI ENDO, INCLUDE ESOPHAGUS, STOMACH, & DUODENUM &/OR JEJUNUM; DX W/WO COLLECTION SPECIMN, BY BRUSH OR WASH;  Surgeon: Mayford Knife, MD;  Location: [facility];  Service: Gastroenterology   ??? SKIN BIOPSY         No current facility-administered medications for this encounter.    Current  Outpatient Medications:   ???  allopurinol (ZYLOPRIM) 300 MG tablet, Take 1 tablet (300 mg total) by mouth daily., Disp: 30 tablet, Rfl: 2  ???  furosemide (LASIX) 20 MG tablet, Take 20 mg by mouth Two (2) times a day., Disp: , Rfl:   ???  melatonin 10 mg cap, Take 10 mg by mouth nightly., Disp: , Rfl:   ???  pantoprazole (PROTONIX) 40 MG tablet, Take 1 tablet (40 mg total) by mouth Two (2) times a day., Disp: 60 tablet, Rfl: 0  ???  prochlorperazine (COMPAZINE) 10 MG tablet, Take 1 tablet (10 mg total) by mouth every six (6) hours as needed for nausea., Disp: 30 tablet, Rfl: 2  ???  valACYclovir (VALTREX) 500 MG tablet, Take 500 mg by mouth daily., Disp: , Rfl:   ???  venetoclax (VENCLEXTA) 100 mg tablet, Take 4 tablets (400 mg total) by mouth daily for 21 days of each cycle., Disp: 84 tablet, Rfl: 5    Allergies  Patient has no known allergies.    Family History   Problem Relation Age of Onset   ??? Alzheimer's disease Father    ??? Melanoma Neg Hx    ??? Basal cell carcinoma Neg Hx    ??? Squamous cell carcinoma Neg Hx        Social History  Social History     Tobacco Use   ??? Smoking status: Never   ??? Smokeless tobacco: Never   Vaping Use   ??? Vaping Use: Never used   Substance Use Topics   ??? Alcohol use: Not Currently   ??? Drug use: Never          Physical Exam     ED Triage Vitals [04/25/21 1514]   Enc Vitals Group      BP 115/57      Heart Rate (S) 116      SpO2 Pulse       Resp 20      Temp 37.7 ??C (99.9 ??F)      Temp Source Oral      SpO2 100 %     Constitutional: Alert and oriented. Well appearing and in no distress.  Obese.  Eyes: Conjunctivae are normal.  ENT       Head: Normocephalic and atraumatic.       Nose: No congestion.       Mouth/Throat: Mucous membranes are moist.       Neck: No stridor.  Hematological/Lymphatic/Immunilogical: No cervical lymphadenopathy.  Cardiovascular: Tachycardic rate, regular rhythm. Normal and symmetric distal pulses are present in all extremities.  Respiratory: Normal respiratory effort. Breath sounds are normal.  Gastrointestinal: Soft and nontender. There is no CVA tenderness.  Genitourinary: Deferred.   Musculoskeletal: Normal range of motion in all extremities.       Right lower leg: No tenderness or edema.       Left lower leg: No tenderness or edema.  Neurologic: Normal speech and language. No gross focal neurologic deficits are appreciated.  Skin: Skin is warm, dry and intact. No rash noted.  Psychiatric: Mood and affect are normal. Speech and behavior are normal.     Labs     Results for orders placed or performed during the hospital encounter of 04/25/21   ECG 12 Lead   Result Value Ref Range    EKG Systolic BP  mmHg    EKG Diastolic BP  mmHg    EKG Ventricular Rate 108 BPM    EKG Atrial Rate 108 BPM  EKG P-R Interval 152 ms    EKG QRS Duration 78 ms    EKG Q-T Interval 336 ms    EKG QTC Calculation 450 ms    EKG Calculated P Axis 45 degrees    EKG Calculated R Axis 26 degrees    EKG Calculated T Axis 67 degrees    QTC Fredericia 408 ms        Radiology     XR Chest 2 views   Final Result      No acute findings.      CT head WO contrast    (Results Pending)         Documentation assistance was provided by Lorrine Kin, Scribe, on April 25, 2021 at 5:19 PM for Ledon Snare, MD.      Documentation assistance was provided by the scribe in my presence.  The documentation recorded by the scribe has been reviewed by me and accurately reflects the services I personally performed.           Dustin Folks Maysa Lynn, MD  Resident  04/26/21 [phone]

## 2021-04-26 NOTE — Unmapped (Addendum)
Vancomycin Therapeutic Monitoring Pharmacy Note    Gunther Zawadzki is a 81 y.o. male starting vancomycin. Date of therapy initiation: 04/25/21    Indication: Febrile Neutropenia    Prior Dosing Information: None/new initiation     Goals:  Therapeutic Drug Levels  Vancomycin trough goal: 10-15 mg/L    Additional Clinical Monitoring/Outcomes  Renal function, volume status (intake and output)    Results: Not applicable    Wt Readings from Last 1 Encounters:   04/13/21 (!) 135.6 kg (299 lb)     Creatinine   Date Value Ref Range Status   04/25/2021 0.76 0.60 - 1.10 mg/dL Final   28/41/3244 0.10 0.60 - 1.10 mg/dL Final   27/25/3664 4.03 0.60 - 1.10 mg/dL Final        Pharmacokinetic Considerations and Significant Drug Interactions:  ??? Adult (estimated initial): Vd = 97 L, ke = 0.069 hr-1  ??? Concurrent nephrotoxic meds: allopurinol, valacyclovir   ??? Large volume of distribution    Assessment/Plan:  Recommendation(s)  ??? Vancomycin 2000 mg IV given 02/20 @ 1811 then 1500 mg IV q12h (0600,1800)  ??? Estimated trough on recommended regimen: 13-15 mg/L    Follow-up  ??? Level due: prior to fourth or fifth dose  ??? A pharmacist will continue to monitor and order levels as appropriate    Please page service pharmacist with questions/clarifications.    Melroy Bougher Williemae Natter, PharmD

## 2021-04-26 NOTE — Unmapped (Signed)
Oncology (MEDO) History & Physical    Assessment & Plan:   Gary Baird is a 81 y.o. 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 *    Neutropenic fever  Patient with Tmax 100.7 ??F at oncology clinic today.  WBC 0.9, ANC 0.4.  No localizing signs/symptoms of infection.  CXR with no acute findings, CT head with no acute intracranial abnormality, and UA bland.  Patient started on empiric vancomycin and cefepime, blood and urine cultures pending. Will narrow antibiotics as appropriate.  - Continue vancomycin and cefepime  - Follow up culture data    AML  Follows with [name] at [facility], currently treated with Vidaza and venetoclax. On Valtrex for HSV prophylaxis.  - Allopurinol 300mg  daily  - Valtrex 500mg  daily    GERD  - Protonix 40mg  BID    Daily Checklist:  Diet: Regular Diet  DVT PPx: Contradindicated - Thrombocytopenic  Electrolytes: Replete Potassium to >/=4 and Magnesium to >/=2  Code Status: Full Code  Dispo: Admit to Med O    Chief Concern:   Neutropenic fever (CMS-HCC)    Subjective:   HPI:  Gary Baird is a 81 y.o. 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    Patient reports being in his normal state of health until the evening of 2/19 when he had a shaking/shivering episode while sitting on the couch talking to his wife. He states he could hear his wife talking to him but for a moment he felt like he was unable to respond. There was no associated weakness or other neurologic changes. EMS was called to evaluate and patient declined transport to the emergency department at that time. He presented to his outpatient oncologist today and was found to be febrile with Tmax 100.7 ??F. He denies any signs/symptoms of infection including no cough, shortness of breath, abdominal pain, nausea/vomiting, diarrhea, or pain/burning on urination. He was sent to the emergency department due to concern for neutropenic fever.    In the ED, patient was afebrile and hemodynamically stable. Initial labs were notable for WBC 0.9 with ANC 0.4, Hgb 8.4, and Plt 48. CXR showed no acute pathology and UA was bland. A CT was also performed that showed no acute intracranial pathology. Blood and urine cultures were sent and he received empiric vancomycin and cefepime. Patient to be admitted for further evaluation and management of neutropenic fever.    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)    Presentation: Incidentally found to have elevated WBC prior to procedure    Labs- WBC = 25.2  Hb = 8.2 g/dL  Platelets = 161  09% monocytes    Bone marrow biopsy- 09/04/2018  Hypercellular marrow- 70-90% cellular, 7% blasts by manual aspirate differential- monocytic appearance, granulocytic dysplasia, dysplasia of megakaryocytes    Final Diagnosis   Date Value Ref Range Status   09/04/2018   Final    (Outside Case #:  [id number], dated 09/04/2018)  Bone marrow,  aspiration and biopsy  -  Hypercellular bone marrow (90%)  involved by chronic myelomonocytic leukemia-1 (9% blasts by manual aspirate differential) (see Comment)  -  Unifocal paratrabecular cluster of atypical CD17-positive cells (see Comment)  -  By outside report, cytogenetic analysis reveals a normal karyotype.  -  By outside report, next generation sequencing for myeloid disorders reveals pathogenic or likely pathogenic variants in CBL (p.C384Y), SRSF2 (p.P95H), TET2 (Z.[id number]*40), and SH2B3 (J.[id number]*82).    Peripheral blood, smear review  -  Leukocytosis with absolute monocytosis  -  Normocytic anemia  -  Thrombocytosis    This electronic signature is attestation that the pathologist personally reviewed the submitted material(s) and the final diagnosis reflects that evaluation.         Flow cytometry: No myeloblasts detectable, 16% monocytes- HLA-DR, CD11b, CD13, CD14, CD33, CD38, CD56, CD64 and without CD34 expression     Cytogenetics = Normal, 46,XY    Mutational Panel:   CBL C384Y- 19.0% VAF  SH2B3 S256Qfs- 58.8% VAF  SRSF2 P95H- 47.1% VAF  TET2 R1440- 86% VAF    Treatment: Azacitidine 75mg /m2 daily for 5 days monthly (09/30/18--12/31/20)        2. Diagnosis: Secondary AML     Developed hematochezia, fatigue, blurry vision, DOE at end of Nov 2022.  WBC 136, Hb 5.4, Pl 313, peripheral blasts 8%      Diagnosis   Date Value Ref Range Status   01/31/2021   Final    Bone marrow, right iliac, aspiration and biopsy  -  Hypercellular bone marrow (90%) consistent with involvement by acute myeloid leukemia (53% blasts by manual aspirate differential)  -  See linked reports for associated Ancillary Studies.      This electronic signature is attestation that the pathologist personally reviewed the submitted material(s) and the final diagnosis reflects that evaluation.          RESULTS   Date Value Ref Range Status   01/31/2021   Preliminary    Normal Karyotype: 46,XY[10]    Normal FISH:  An interphase FISH assay shows no evidence of a rearrangement involving the KMT2A (MLL) gene region in the 200 nuclei scored.            Myeloid Panel:   Variants of Known/Likely Clinical Significance:   Gene Coding Predicted Protein Variant allele fraction   ASXL1 c.2158del p.(Asp720Thrfs*5) 42.7 %   SRSF2 c.284C>A p.(Pro95His) 45.5 %   TET2 c.4317dupA p.(Arg1440Thrfs*38) 90.4 %     Cycle 1: 02/14/2021  Azacitidine 75mg /m2 x 7 days  Venetoclax 400mg  x 28 days    BMBx 03/08/21   Bone marrow, right iliac, aspiration and biopsy  -  Limited bone marrow sampling with increased megakaryocytes and less than 1% blasts on touch prep differential count (see Comment)   -  See linked reports for associated Ancillary Studies.    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 Lavoy Bernards currently has decisional capacity for healthcare decision-making and is able to designate a surrogate healthcare decision maker. Mr. Dralle Jr's designated healthcare decision maker(s) is/are Victorino Dike (the patient's adult child) as denoted by stated patient preference.    Allergies:  Patient has no known allergies.    Medications:   Prior to Admission medications    Medication Dose, Route, Frequency   allopurinol (ZYLOPRIM) 300 MG tablet 300 mg, Oral, Daily (standard)   furosemide (LASIX) 20 MG tablet 20 mg, Oral, 2 times a day (standard)   melatonin 10 mg cap 10 mg, Oral, Nightly   pantoprazole (PROTONIX) 40 MG tablet 40 mg, Oral, 2 times a day (standard)   prochlorperazine (COMPAZINE) 10 MG tablet 10 mg, Oral, Every 6 hours PRN   valACYclovir (VALTREX) 500 MG tablet 500 mg, Oral, Daily (standard)   venetoclax (VENCLEXTA) 100 mg tablet Take 4 tablets (400 mg total) by mouth daily for 21 days of each cycle.       Medical History:  Past Medical History:   Diagnosis Date   ??? Cancer (CMS-HCC)    ??? Hypertension        Surgical History:  Past Surgical History:   Procedure Laterality Date   ??? HERNIA REPAIR  1975   ??? PR COLONOSCOPY FLX DX W/COLLJ SPEC WHEN PFRMD N/A 02/02/2021    Procedure: COLONOSCOPY, FLEXIBLE, PROXIMAL TO SPLENIC FLEXURE; DIAGNOSTIC, W/WO COLLECTION SPECIMEN BY BRUSH OR WASH;  Surgeon: Mayford Knife, MD;  Location: [facility];  Service: Gastroenterology   ??? PR ENDOSCOPY UPPER SMALL INTESTINE N/A 03/17/2021    Procedure: SMALL INTESTINAL ENDOSCOPY, ENTEROSCOPY BEYOND SECOND PORTION OF DUODENUM, NOT INCL ILEUM; DX, INCL COLLECTION OF SPECIMEN(S) BY BRUSHING OR WASHING, WHEN PERFORMED;  Surgeon: Vonda Antigua, MD;  Location: [facility];  Service: Gastroenterology   ??? PR GASTROINTESTINAL TRACT IMAGING ESOPHAGUS W/I&R N/A 02/02/2021    Procedure: GI PATENCY TRACT IMAGING, INTRALUMINAL (EG, CAPSULE ENDOSCOPY), ESOPHAGUS W/PHYSICIAN INTERP/REPORT;  Surgeon: Mayford Knife, MD;  [facility]: [facility];  Service: Gastroenterology   ??? PR GI IMAG INTRALUMINAL ESOPHAGUS-ILEUM W/I&R N/A 03/15/2021    Procedure: GI VIDEO TRACT IMAGE INTRALUMINAL (EG, CAPSULE ENDOSCOPY), ESOPHAGUS VIA ILEUM, PHYSICIAN INTERPRETATION & REPORT;  Surgeon: Monte Fantasia, MD;  Location: [facility];  Service: Gastroenterology   ??? PR UPPER GI ENDOSCOPY,DIAGNOSIS N/A 02/02/2021    Procedure: UGI ENDO, INCLUDE ESOPHAGUS, STOMACH, & DUODENUM &/OR JEJUNUM; DX W/WO COLLECTION SPECIMN, BY BRUSH OR WASH;  Surgeon: Mayford Knife, MD;  Location: [facility];  Service: Gastroenterology   ??? SKIN BIOPSY         Social History:  Social History     Socioeconomic History   ??? Marital status: Divorced   Tobacco Use   ??? Smoking status: Never   ??? Smokeless tobacco: Never   Vaping Use   ??? Vaping Use: Never used   Substance and Sexual Activity   ??? Alcohol use: Not Currently   ??? Drug use: Never   ??? Sexual activity: Yes   Other Topics Concern   ??? Do you use sunscreen? No   ??? Tanning bed use?  No   ??? Are you easily burned? No   ??? Excessive sun exposure? Yes   ??? Blistering sunburns? No     Social Determinants of Health     Financial Resource Strain: Medium Risk   ??? Difficulty of Paying Living Expenses: Somewhat hard   Food Insecurity: No Food Insecurity   ??? Worried About Running Out of Food in the Last Year: Never true   ??? Ran Out of Food in the Last Year: Never true   Transportation Needs: No Transportation Needs   ??? Lack of Transportation (Medical): No   ??? Lack of Transportation (Non-Medical): No        Family History:  Family History   Problem Relation Age of Onset   ??? Alzheimer's disease Father    ??? Melanoma Neg Hx    ??? Basal cell carcinoma Neg Hx    ??? Squamous cell carcinoma Neg Hx        Review of Systems:  A 12 point review of systems was negative except for pertinent items noted in the HPI and on the patient intake form.  The form has been reviewed with the patient or caregiver.    Objective:   Physical Exam:  Temp:  [37.7 ??C (99.8 ??F)-37.7 ??C (99.9 ??F)] 37.7 ??C (99.8 ??F)  Heart Rate:  [104-116] 110  Resp:  [18-20] 20  BP: (115-144)/(57-87) 137/70  SpO2:  [91 %-100 %] 91 %,     Intake/Output Summary (Last 24 hours) at 04/26/2021 0144  Last data filed at 04/25/2021 2050  Gross per 24 hour   Intake 600 ml   Output --   Net 600 ml   ,   Wt Readings from Last 3 Encounters:   04/13/21 (!) 135.6 kg (299 lb)   03/18/21 (!) 137 kg (302 lb 0.5 oz)   03/03/21 (!) 140 kg (308 lb 9.6 oz)       Gen: Obese man in no acute distress. Alert and oriented, answers questions appropriately.  HEENT: Atraumatic, normocephalic. Extraocular movements intact. Pupils equal and reactive to light bilaterally.  Heart: RRR, normal S1 and S2. No murmurs.  Lungs: No increased work of breathing. Clear to auscultation bilaterally.  Abdomen: Soft, nontender, nondistended.  Extremities: No lower extremity edema.  Neuro: CN II-XI grossly intact. No focal deficits.  Skin:  Dry cracked skin on lower extremities.  Psych: Normal mood and affect. Labs/Studies:  Labs and Studies from the last 24hrs per EMR and Reviewed:  All lab results last 24 hours -   Recent Results (from the past 24 hour(s))   ECG 12 Lead    Collection Time: 04/25/21  3:27 PM   Result Value Ref Range    EKG Systolic BP  mmHg    EKG Diastolic BP  mmHg    EKG Ventricular Rate 108 BPM    EKG Atrial Rate 108 BPM    EKG P-R Interval 152 ms    EKG QRS Duration 78 ms    EKG Q-T Interval 336 ms    EKG QTC Calculation 450 ms    EKG Calculated P Axis 45 degrees    EKG Calculated R Axis 26 degrees    EKG Calculated T Axis 67 degrees    QTC Fredericia 408 ms   Comprehensive metabolic panel    Collection Time: 04/25/21  5:36 PM   Result Value Ref Range    Sodium 136 135 - 145 mmol/L    Potassium 4.0 3.4 - 4.8 mmol/L  Chloride 103 98 - 107 mmol/L    CO2 24.0 20.0 - 31.0 mmol/L    Anion Gap 9 5 - 14 mmol/L    BUN 8 (L) 9 - 23 mg/dL    Creatinine 3.01 6.01 - 1.10 mg/dL    BUN/Creatinine Ratio 11     eGFR CKD-EPI (2021) Male 90 >=60 mL/min/1.56m2    Glucose 113 70 - 179 mg/dL    Calcium 9.0 8.7 - 09.3 mg/dL    Albumin 3.1 (L) 3.4 - 5.0 g/dL    Total Protein 7.1 5.7 - 8.2 g/dL    Total Bilirubin 0.4 0.3 - 1.2 mg/dL    AST 25 <=23 U/L    ALT 17 10 - 49 U/L    Alkaline Phosphatase 73 46 - 116 U/L   Lipase    Collection Time: 04/25/21  5:36 PM   Result Value Ref Range    Lipase 24 12 - 53 U/L   Lactic Acid, Venous, Whole Blood    Collection Time: 04/25/21  5:36 PM   Result Value Ref Range    Lactate, Venous 1.2 0.5 - 1.8 mmol/L   hsTroponin I (single, no delta)    Collection Time: 04/25/21  5:36 PM   Result Value Ref Range    hsTroponin I 5 <=53 ng/L   Respiratory Pathogen Panel with COVID-19    Collection Time: 04/25/21  5:36 PM   Result Value Ref Range    Adenovirus Not Detected Not Detected    Coronavirus HKU1 Not Detected Not Detected    Coronavirus NL63 Not Detected Not Detected    Coronavirus 229E Not Detected Not Detected    Coronavirus OC43 PCR Not Detected Not Detected    Metapneumovirus Not Detected Not Detected    Rhinovirus/Enterovirus Not Detected Not Detected    Influenza A Not Detected Not Detected    Influenza B Not Detected Not Detected    Parainfluenza 1 Not Detected Not Detected    Parainfluenza 2 Not Detected Not Detected    Parainfluenza 3 Not Detected Not Detected    Parainfluenza 4 Not Detected Not Detected    RSV Not Detected Not Detected    Bordetella pertussis Not Detected Not Detected    Bordetella parapertussis Not Detected Not Detected    Chlamydophila (Chlamydia) pneumoniae Not Detected Not Detected    Mycoplasma pneumoniae Not Detected Not Detected    SARS-CoV-2 PCR Not Detected Not Detected   CBC w/ Differential    Collection Time: 04/25/21  5:46 PM   Result Value Ref Range    WBC 0.9 (L) 3.6 - 11.2 10*9/L    RBC 3.08 (L) 4.26 - 5.60 10*12/L    HGB 8.4 (L) 12.9 - 16.5 g/dL    HCT 55.7 (L) 32.2 - 48.0 %    MCV 84.1 77.6 - 95.7 fL    MCH 27.4 25.9 - 32.4 pg    MCHC 32.6 32.0 - 36.0 g/dL    RDW 02.5 (H) 42.7 - 15.2 %    MPV 8.1 6.8 - 10.7 fL    Platelet 48 (L) 150 - 450 10*9/L    Neutrophils % 44.2 %    Lymphocytes % 40.9 %    Monocytes % 12.6 %    Eosinophils % 1.1 %    Basophils % 1.2 %    Absolute Neutrophils 0.4 (LL) 1.8 - 7.8 10*9/L    Absolute Lymphocytes 0.4 (L) 1.1 - 3.6 10*9/L    Absolute Monocytes 0.1 (L) 0.3 - 0.8 10*9/L  Absolute Eosinophils 0.0 0.0 - 0.5 10*9/L    Absolute Basophils 0.0 0.0 - 0.1 10*9/L    Anisocytosis Marked (A) Not Present   Morphology Review    Collection Time: 04/25/21  5:46 PM   Result Value Ref Range    Smear Review Comments See Comment (A) Undefined    Schistocytes Rare (A) Not Present    Poikilocytosis Moderate (A) Not Present   Urinalysis with Microscopy    Collection Time: 04/25/21  7:26 PM   Result Value Ref Range    Color, UA Yellow     Clarity, UA Clear     Specific Gravity, UA 1.022 1.003 - 1.030    pH, UA 5.5 5.0 - 9.0    Leukocyte Esterase, UA Negative Negative    Nitrite, UA Negative Negative    Protein, UA 30 mg/dL (A) Negative Glucose, UA Negative Negative    Ketones, UA Negative Negative    Urobilinogen, UA <2.0 mg/dL <4.7 mg/dL    Bilirubin, UA Negative Negative    Blood, UA Negative Negative    RBC, UA 3 <=3 /HPF    WBC, UA 2 <=2 /HPF    Squam Epithel, UA <1 0 - 5 /HPF    Bacteria, UA None Seen None Seen /HPF    Mucus, UA Rare (A) None Seen /HPF       Imaging:  ECG 12 Lead    Result Date: 04/25/2021  SINUS TACHYCARDIA OTHERWISE NORMAL ECG WHEN COMPARED WITH ECG OF 17-Mar-2021 17:32, NONSPECIFIC T WAVE ABNORMALITY NO LONGER EVIDENT IN INFERIOR LEADS Confirmed by Freeman Caldron ([id number]) on 04/25/2021 4:20:14 PM    CT head WO contrast    Result Date: 04/25/2021  EXAM: Computed tomography, head or brain without contrast material. DATE: 04/25/2021 8:23 PM ACCESSION: [id number] UN DICTATED: 04/25/2021 8:24 PM INTERPRETATION LOCATION: [facility] CLINICAL INDICATION: 81 years old Male with altered mental status ; Mental status change, persistent or worsening  COMPARISON: None TECHNIQUE: Axial CT images of the head  from skull base to vertex without contrast. FINDINGS: No findings to suggest acute hemorrhage or acute infarct. Mild cerebral atrophy with ex vacuo dilation of ventricles. Scattered periventricular and  hypoattenuating regions, which are nonspecific but can be seen in small vessel ischemic disease. No findings to suggest mass effect. Bilateral basal ganglia calcifications. Carotid siphon and vertebral artery calcifications. Mild mucosal thickening in the right maxillary sinus and within a hyperpneumatized left sphenoid sinus. The sinuses are otherwise pneumatized. Mastoid air cells are clear. No acute fracture. Orbits are unremarkable. There is a 1.4 cm subcutaneous nodule over the right cheek (3:5), probably an epidermal inclusion cyst.     -No acute intracranial abnormality.    XR Chest 2 views    Result Date: 04/25/2021  EXAM: XR CHEST 2 VIEWS DATE: 04/25/2021 4:08 PM ACCESSION: [id number] UN DICTATED: 04/25/2021 4:20 PM INTERPRETATION LOCATION: [facility] CLINICAL INDICATION: 81 year old male with fever.  COMPARISON: January 12. TECHNIQUE: PA and lateral views of the chest were performed. FINDINGS: The lungs are clear with no pleural fluid and a normal cardiomediastinal contour.  Stable tortuosity of the descending thoracic aorta.     No acute findings.

## 2021-04-26 NOTE — Unmapped (Addendum)
Outpatient follow-up items:  [ ]  Oncology to follow labs 2/28, determine need for transfusion, extended duration abx treatment/resumption of prophylaxis    Hospital course:  Gary Baird is a 81 y.o. 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    Neutropenic Fever  Pt first fevered 2/19, presented to ED at that time without other signs or symptoms of infection, found to be neutropenic to ANC 0.4. He was treated with broad-spectrum antibiotics. CT w/o evidence for new PNA, blood and urine cultures were negative. Pt felt subjectively well and ANC recovered to 0.5 2/21. Pt was discharged 2/23 on regimen of Augmentin per primary oncologist and will follow up labs 2/28.   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 On monthly azacitidine since 09/2018 for CMML, stopped 12/30/20. Patient developed GI bleeding and worsening leukocytosis and was admitted to [facility] for management on 01/29/21. BMBx identified progression to AML. Started Aza/Ven 02/14/2021. S/p C1 Aza/Ven with BMBx revealing morphologic remission. Recent bone marrow biopsy results, showed <1% blasts, revealing CR. Due for cycle 2 Aza/Ven on 1/9. Per 1/5 note from Dr. Vertell Limber, if [name] >=1,000, patient will require a 1-week delay for his next cycle of treatment. Plan is to coordinate future treatment cycles to be done locally with [name] at [facility].  -Allopurinol and valtrex were continued during hospital stay     GERD  - Protonix 40mg  BID

## 2021-04-27 ENCOUNTER — Telehealth: Payer: Self-pay | Admitting: *Deleted

## 2021-04-27 ENCOUNTER — Inpatient Hospital Stay: Payer: Medicare Other

## 2021-04-27 LAB — MAGNESIUM: MAGNESIUM: 1.6 mg/dL (ref 1.6–2.6)

## 2021-04-27 LAB — APTT
APTT: 27.9 s (ref 25.1–36.5)
HEPARIN CORRELATION: 0.2

## 2021-04-27 LAB — CBC W/ AUTO DIFF
BASOPHILS ABSOLUTE COUNT: 0 10*9/L (ref 0.0–0.1)
BASOPHILS RELATIVE PERCENT: 0.9 %
EOSINOPHILS ABSOLUTE COUNT: 0 10*9/L (ref 0.0–0.5)
EOSINOPHILS RELATIVE PERCENT: 3.3 %
HEMATOCRIT: 22.7 % — ABNORMAL LOW (ref 39.0–48.0)
HEMOGLOBIN: 7.5 g/dL — ABNORMAL LOW (ref 12.9–16.5)
LYMPHOCYTES ABSOLUTE COUNT: 0.3 10*9/L — ABNORMAL LOW (ref 1.1–3.6)
LYMPHOCYTES RELATIVE PERCENT: 32.6 %
MEAN CORPUSCULAR HEMOGLOBIN CONC: 33.1 g/dL (ref 32.0–36.0)
MEAN CORPUSCULAR HEMOGLOBIN: 27.6 pg (ref 25.9–32.4)
MEAN CORPUSCULAR VOLUME: 83.3 fL (ref 77.6–95.7)
MEAN PLATELET VOLUME: 8.2 fL (ref 6.8–10.7)
MONOCYTES ABSOLUTE COUNT: 0.1 10*9/L — ABNORMAL LOW (ref 0.3–0.8)
MONOCYTES RELATIVE PERCENT: 11 %
NEUTROPHILS ABSOLUTE COUNT: 0.5 10*9/L — ABNORMAL LOW (ref 1.8–7.8)
NEUTROPHILS RELATIVE PERCENT: 52.2 %
PLATELET COUNT: 50 10*9/L — ABNORMAL LOW (ref 150–450)
RED BLOOD CELL COUNT: 2.72 10*12/L — ABNORMAL LOW (ref 4.26–5.60)
RED CELL DISTRIBUTION WIDTH: 24.4 % — ABNORMAL HIGH (ref 12.2–15.2)
WBC ADJUSTED: 0.9 10*9/L — ABNORMAL LOW (ref 3.6–11.2)

## 2021-04-27 LAB — BASIC METABOLIC PANEL
ANION GAP: 6 mmol/L (ref 5–14)
BLOOD UREA NITROGEN: <5 mg/dL — ABNORMAL LOW (ref 9–23)
CALCIUM: 8.3 mg/dL — ABNORMAL LOW (ref 8.7–10.4)
CHLORIDE: 106 mmol/L (ref 98–107)
CO2: 25 mmol/L (ref 20.0–31.0)
CREATININE: 0.63 mg/dL
EGFR CKD-EPI (2021) MALE: >90 mL/min/{1.73_m2} (ref >=60)
GLUCOSE RANDOM: 126 mg/dL (ref 70–179)
POTASSIUM: 3 mmol/L — ABNORMAL LOW (ref 3.4–4.8)
SODIUM: 137 mmol/L (ref 135–145)

## 2021-04-27 LAB — D-DIMER, QUANTITATIVE: D-DIMER QUANTITATIVE (CW,ML,HL,HS,CH,JS,JC,RX): 1232 ng{FEU}/mL — ABNORMAL HIGH (ref <=500)

## 2021-04-27 LAB — HEPATIC FUNCTION PANEL
ALBUMIN: 2.5 g/dL — ABNORMAL LOW (ref 3.4–5.0)
ALKALINE PHOSPHATASE: 57 U/L (ref 46–116)
ALT (SGPT): 17 U/L (ref 10–49)
AST (SGOT): 18 U/L (ref <=34)
BILIRUBIN DIRECT: 0.2 mg/dL (ref 0.00–0.30)
BILIRUBIN TOTAL: 0.4 mg/dL (ref 0.3–1.2)
PROTEIN TOTAL: 5.8 g/dL (ref 5.7–8.2)

## 2021-04-27 LAB — PROTIME-INR
INR: 1.29
PROTIME: 14.7 s — ABNORMAL HIGH (ref 9.8–12.8)

## 2021-04-27 LAB — SLIDE REVIEW

## 2021-04-27 LAB — FIBRINOGEN: FIBRINOGEN LEVEL: 461 mg/dL (ref 175–500)

## 2021-04-27 LAB — PHOSPHORUS: PHOSPHORUS: 3 mg/dL (ref 2.4–5.1)

## 2021-04-27 MED ADMIN — cefepime (MAXIPIME) 2 g in sodium chloride 0.9 % (NS) 100 mL IVPB-connector bag: 2 g | INTRAVENOUS | @ 09:00:00 | Stop: 2021-05-02

## 2021-04-27 MED ADMIN — cefepime (MAXIPIME) 2 g in sodium chloride 0.9 % (NS) 100 mL IVPB-connector bag: 2 g | INTRAVENOUS | @ 17:00:00 | Stop: 2021-05-02

## 2021-04-27 MED ADMIN — valACYclovir (VALTREX) tablet 500 mg: 500 mg | ORAL | @ 13:00:00

## 2021-04-27 MED ADMIN — melatonin tablet 3 mg: 3 mg | ORAL | @ 03:00:00 | Stop: 2021-04-26

## 2021-04-27 MED ADMIN — pantoprazole (PROTONIX) EC tablet 40 mg: 40 mg | ORAL | @ 03:00:00

## 2021-04-27 MED ADMIN — traZODone (DESYREL) tablet 50 mg: 50 mg | ORAL | @ 03:00:00

## 2021-04-27 MED ADMIN — pantoprazole (PROTONIX) EC tablet 40 mg: 40 mg | ORAL | @ 13:00:00

## 2021-04-27 MED ADMIN — potassium chloride CR tablet 40 mEq: 40 meq | ORAL | @ 13:00:00 | Stop: 2021-04-27

## 2021-04-27 MED ADMIN — magnesium sulfate 2gm/50mL IVPB: 2 g | INTRAVENOUS | @ 13:00:00 | Stop: 2021-04-27

## 2021-04-27 MED ADMIN — allopurinol (ZYLOPRIM) tablet 300 mg: 300 mg | ORAL | @ 13:00:00

## 2021-04-27 MED ADMIN — potassium chloride 10 mEq in 100 mL IVPB: 10 meq | INTRAVENOUS | @ 13:00:00 | Stop: 2021-04-27

## 2021-04-27 MED ADMIN — potassium chloride 10 mEq in 100 mL IVPB: 10 meq | INTRAVENOUS | @ 15:00:00 | Stop: 2021-04-27

## 2021-04-27 NOTE — Unmapped (Signed)
Problem: Adult Inpatient Plan of Care  Goal: Plan of Care Review  Outcome: Progressing  Flowsheets (Taken 04/27/2021 0448)  Progress: improving  Plan of Care Reviewed With: patient  Note:   NAEON. IV abx as ordered via PIV. No new complaints. Trazodone for sleep to good effect.  Vitals:    04/26/21 2016 04/27/21 0358   BP: 107/54 126/65   Pulse: 105 103   Resp: 18 18   Temp: 37 ??C (98.6 ??F) 37.3 ??C (99.1 ??F)   TempSrc: Oral Oral   SpO2: 100% 100%   Weight:     Height:       I/O this shift:  In: 300 [IV Piggyback:300]  Out: 300 [Urine:300]    Scheduled Meds:   Cefepime  2 g Intravenous Q8H    melatonin  6 mg Oral QPM    pantoprazole  40 mg Oral BID     Continuous Infusions: none  PRN Meds:  traZODone x1 for sleep  Goal: Patient-Specific Goal (Individualized)  Outcome: Progressing  Flowsheets (Taken 04/27/2021 0448)  Patient-Specific Goals (Include Timeframe): remain afebrile, recover to ANC >1 by end of week  Individualized Care Needs: IV abx  Anxieties, Fears or Concerns: none  Goal: Absence of Hospital-Acquired Illness or Injury  Outcome: Progressing  Goal: Optimal Comfort and Wellbeing  Outcome: Progressing  Intervention: Provide Person-Centered Care  Flowsheets (Taken 04/27/2021 0448)  Trust Relationship/Rapport:   care explained   choices provided   emotional support provided   empathic listening provided   questions answered   questions encouraged   reassurance provided   thoughts/feelings acknowledged     Problem: Self-Care Deficit  Goal: Improved Ability to Complete Activities of Daily Living  Outcome: Resolved  Intervention: Promote Activity and Functional Independence  Recent Flowsheet Documentation  Taken 04/26/2021 2210 by Theophilus Kinds, RN  Self-Care Promotion: (cane at bedside)   independence encouraged   BADL personal objects within reach   BADL personal routines maintained   other (see comments)     Problem: Infection  Goal: Absence of Infection Signs and Symptoms  Outcome: Resolved     Problem: Fall Injury Risk  Goal: Absence of Fall and Fall-Related Injury  Outcome: Resolved  Intervention: Identify and Manage Contributors  Recent Flowsheet Documentation  Taken 04/26/2021 2210 by Theophilus Kinds, RN  Self-Care Promotion: (cane at bedside)   independence encouraged   BADL personal objects within reach   BADL personal routines maintained   other (see comments)

## 2021-04-27 NOTE — Unmapped (Signed)
Oncology (MEDO) Progress Note    Assessment & Plan:   Gary Baird is a 81 y.o. 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 *    Neutropenic fever  Patient with Tmax 100.7 ??F at oncology clinic today.  WBC 0.9, ANC 0.4.  No localizing signs/symptoms of infection.  CXR with no acute findings, CT head with no acute intracranial abnormality, and UA bland.  Patient started on empiric vancomycin and cefepime, blood and urine cultures pending.   - CT chest 2/21: Interval clearing of left-sided groundglass nodules and consolidations. Otherwise, no acute findings within the chest.  - Discontinue vancomycin, continue cefepime   - Follow blood cultures, urine culture  ??  AML  Follows with Dr. Vertell Limber at [facility], and locally with [name] at [facility], currently treated with Vidaza and venetoclax. On Valtrex for HSV prophylaxis. Plan to start next cycle of chemotherapy when ANC>1000.  - Allopurinol 300mg  daily  - Valtrex 500mg  daily  ??  GERD  - Protonix 40mg  BID    Lower extremity edema: Holding home Lasix 20 mg daily given concern for infection. Elevate legs, encourage ambulation, and compression wraps.  ??  Daily Checklist:  Diet: Regular Diet  DVT PPx: Contradindicated - Thrombocytopenic  Electrolytes: Replete Potassium to >/=4 and Magnesium to >/=2  Code Status: Full Code  Dispo: Admit to Med O    Interval History:   No acute events overnight. Some nausea this morning, improved with Zofran.    ROS: Denies headache, chest pain, shortness of breath, abdominal pain, nausea, vomiting.    Objective:   Temp:  [36.5 ??C (97.7 ??F)-37.4 ??C (99.3 ??F)] 37 ??C (98.6 ??F)  Heart Rate:  [98-110] 102  SpO2 Pulse:  [108] 108  Resp:  [16-20] 18  BP: (112-143)/(49-72) 123/61  SpO2:  [91 %-100 %] 100 %    Gen: Very pleasant elderly gentleman in no acute distress. Alert and oriented, answers questions appropriately.  HEENT: Atraumatic, normocephalic.  Heart: RRR, normal S1 and S2. No murmurs.  Lungs: Lungs CTAB, no increased WOB.  Abdomen: Soft, nontender, non-distended.  Extremities: No lower extremity edema.  Neuro: CN II-XI grossly intact. No focal deficits.  Skin:  Dry cracked skin on lower extremities. Lower extremities with 1-2+ pitting edema to knees, and venous-stasis changes.  Psych: Normal mood and affect.     Labs/Studies: Labs and Studies from the last 24hrs per EMR and Reviewed

## 2021-04-27 NOTE — Unmapped (Addendum)
PACT Study Note-Occupational Therapy     Date: 04/27/2021  Start Time: 1120  End Time: 1148  Setting: Inpatient  Cycle #: 2  Cycle Day: 18      Patient Subjective   Patient Report I hope they find out what is going on and then I can go home   Pain Score Pre-Assessment  None, 0/10   Pain Score Post-Assessment None, 0/10   Equipment Used at Home Already Assessed   Have you fallen in the past 6 months?  Already Assessed   Fall Description    Not Applicable     Do you currently:    Manage your medications? Yes   Drive? Yes   Meal prep? No   Perform household management tasks?  Yes     IADL Assessment:            Clinic Environment  Pt seen while inpatient status on 4 oncology    Equipment Attached to Patient IV pole      I. OCCUPATIONAL PROFILE/HISTORY AND PRESENT SITUATION  (Free text)-  ?? Patient reports that things have been going well at home. 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 He has been going to church and has been meeting up with a group of guy friends about once a week. He reports he tries to be cautious when out in the community and always wears a mask. He reports he will typically take his cane with him if he is leaving the house and going long distances. He denies falls and reports he has been independent with his ADL routine. He and his daughter report that he is now willing to ask for help when he needs it and alert his family if he is not feeling well (references an episode last week where he was briefly unable to speak). He acknowledges that this is a shift for him as he has always been very independent and able to handle things on his own.     OT Assessments   PASS-Performance Assessment of Self-Care Skills  Completed: Yes   Task 1  Washing Hands    PERFORMANCE RATING SCORE    Independence 2.9/3    Safety 3    Adequacy (Process & Quality) 3    PASS Washing Hands Task Assessment:  Pt required a verbal directive cue to remember to use soap when washing hands.        Task 2  Medication Management    PERFORMANCE RATING SCORE    Independence 2/3    Safety 3/3    Adequacy (Process & Quality) 2/3    PASS Medication Management Task Assessment:  Pt required cues for distributing pills as well as physical assist when opening the child proof pill bottle (pt has decreased functional use of R hand since he was a kid)       DIMA  Completed: Yes   Dominant Hand  Left    Trial 1 20Lbs.    Trial 2 15Lbs.    Trial 3 20Lbs.    Average 18.3Lbs.    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 R hand therefore not tested.        Clock Drawing     Completed: Yes   Severity Rating 12/13   Clock Drawing Assessment:   Pt scored a 0 on where do the hands originate       AM-PAC: Daily Activity Form  Completed: Yes   Adaptive equipment used during assessment None    Item Score   Putting on & taking off regular lower body clothing 4   Bathing (including washing, rinsing, drying) 4   Toileting (includes using the toilet, bedpan, or urinal) 4   Putting on & taking off regular upper body clothing 4   Taking care of personal grooming such as brushing teeth 4   Eating meals 4   Raw Score 24/24   T-Scale Score 57.54   CMS 0-100% Score 0%   AMPAC Assessment:           Would participant benefit from administration of the MOCA in future sessions: no    COPM adapted Form:    Occupations Importance Goals   Selfcare/taking care Performance=10  Satisfaction=10         To be independent with all self care tasks    Leisure, fun, renewal  Performance=8  Satisfaction=10         To go to church on Sunday   Project/Productive activities  Performance=10  Satisfaction=10       To go to work (mows yards)                  No billing completed.

## 2021-04-27 NOTE — Unmapped (Signed)
Oncology (MEDO) Progress Note    Assessment & Plan:   Gary Baird is a 81 y.o. 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 *  ??  Neutropenic fever  Pt afebrile overnight, VSS. Continues on cefepime. All cultures NG@24h . Likely discharge 2/23 on oral abx. ANC stable at 0.5.   - Cont cefepime  - Follow blood cultures, urine culture  ??  AML  Follows with Dr. Vertell Limber at [facility], and locally with [name] at [facility], currently treated with Vidaza and venetoclax. On Valtrex for HSV prophylaxis. Plan to start next cycle of chemotherapy when ANC>1000.  - Allopurinol 300mg  daily  - Valtrex 500mg  daily    Chronic Problems:  GERD: home protonix 40 mg BID    Daily Checklist:  Diet: Regular Diet  DVT PPx: Contradindicated - Thrombocytopenic  Electrolytes: Replete Potassium to >/=4 and Magnesium to >/=2  Code Status: Full Code  Dispo: Continue floor level care    Team Contact Information:   Primary Team: Oncology (MEDO)  Primary Resident: Cristy Hilts, MD  Resident's Pager: [phone] (Oncology Intern - Cliffton Asters)    Interval History:   No acute events overnight.  Pt denies problems as below. No new concerns.    ROS: Denies headache, chest pain, shortness of breath, abdominal pain, nausea, vomiting.    Objective:   Temp:  [36.5 ??C (97.7 ??F)-37.3 ??C (99.1 ??F)] 37.3 ??C (99.1 ??F)  Heart Rate:  [98-105] 103  Resp:  [18] 18  BP: (107-126)/(49-65) 126/65  SpO2:  [100 %] 100 %    Gen:??Very pleasant elderly gentleman in no acute distress. Alert and oriented, answers questions appropriately, watching football  HEENT: Atraumatic, normocephalic.  Heart: RRR, normal S1 and S2. No murmurs.  Lungs: Lungs CTAB, no increased WOB.  Abdomen: Soft, nontender, non-distended.  Extremities: trace pitting edema bilaterally  Neuro: CN II-XI grossly intact. No focal deficits.  Psych: Mood good, affect pleasant    Labs/Studies: Labs and Studies from the last 24hrs per EMR and Reviewed    Lubertha South, MD  Internal Medicine PGY-1

## 2021-04-27 NOTE — Unmapped (Signed)
Pt's A&Ox 4, VSS, afebrile, denies pain, complaints of nausea and PRN zofran x1 given with relief, CT chest done today, KCL tab given, scheduled antibiotics given, no fall or injuries throughout the shift.      Problem: Fall Injury Risk  Goal: Absence of Fall and Fall-Related Injury  Outcome: Ongoing - Unchanged  Intervention: Promote Injury-Free Environment  Recent Flowsheet Documentation  Taken 04/26/2021 1610 by Sharlee Blew, RN  Safety Interventions:  ??? fall reduction program maintained  ??? lighting adjusted for tasks/safety  ??? low bed  ??? commode/urinal/bedpan at bedside  ??? nonskid shoes/slippers when out of bed     Problem: Infection  Goal: Absence of Infection Signs and Symptoms  Outcome: Ongoing - Unchanged     Problem: Self-Care Deficit  Goal: Improved Ability to Complete Activities of Daily Living  Outcome: Ongoing - Unchanged     Problem: Adult Inpatient Plan of Care  Goal: Plan of Care Review  Outcome: Ongoing - Unchanged  Goal: Patient-Specific Goal (Individualized)  Outcome: Ongoing - Unchanged  Flowsheets (Taken 04/26/2021 1656)  Individualized Care Needs: clustered care, small frequent meals, oral care  Anxieties, Fears or Concerns: nausea  Goal: Absence of Hospital-Acquired Illness or Injury  Outcome: Ongoing - Unchanged  Intervention: Identify and Manage Fall Risk  Recent Flowsheet Documentation  Taken 04/26/2021 0828 by Sharlee Blew, RN  Safety Interventions:  ??? fall reduction program maintained  ??? lighting adjusted for tasks/safety  ??? low bed  ??? commode/urinal/bedpan at bedside  ??? nonskid shoes/slippers when out of bed  Intervention: Prevent and Manage VTE (Venous Thromboembolism) Risk  Recent Flowsheet Documentation  Taken 04/26/2021 0828 by Sharlee Blew, RN  Activity Management: activity adjusted per tolerance  Goal: Optimal Comfort and Wellbeing  Outcome: Ongoing - Unchanged  Goal: Readiness for Transition of Care  Outcome: Ongoing - Unchanged  Goal: Rounds/Family Conference  Outcome: Ongoing - Unchanged

## 2021-04-28 ENCOUNTER — Inpatient Hospital Stay: Payer: Medicare Other

## 2021-04-28 LAB — CBC W/ AUTO DIFF
BASOPHILS ABSOLUTE COUNT: 0 10*9/L (ref 0.0–0.1)
BASOPHILS RELATIVE PERCENT: 1 %
EOSINOPHILS ABSOLUTE COUNT: 0 10*9/L (ref 0.0–0.5)
EOSINOPHILS RELATIVE PERCENT: 2.5 %
HEMATOCRIT: 22.7 % — ABNORMAL LOW (ref 39.0–48.0)
HEMOGLOBIN: 7.6 g/dL — ABNORMAL LOW (ref 12.9–16.5)
LYMPHOCYTES ABSOLUTE COUNT: 0.5 10*9/L — ABNORMAL LOW (ref 1.1–3.6)
LYMPHOCYTES RELATIVE PERCENT: 46.5 %
MEAN CORPUSCULAR HEMOGLOBIN CONC: 33.4 g/dL (ref 32.0–36.0)
MEAN CORPUSCULAR HEMOGLOBIN: 27.7 pg (ref 25.9–32.4)
MEAN CORPUSCULAR VOLUME: 83.1 fL (ref 77.6–95.7)
MEAN PLATELET VOLUME: 8.3 fL (ref 6.8–10.7)
MONOCYTES ABSOLUTE COUNT: 0.1 10*9/L — ABNORMAL LOW (ref 0.3–0.8)
MONOCYTES RELATIVE PERCENT: 13.1 %
NEUTROPHILS ABSOLUTE COUNT: 0.4 10*9/L — CL (ref 1.8–7.8)
NEUTROPHILS RELATIVE PERCENT: 36.9 %
NUCLEATED RED BLOOD CELLS: 1 /100{WBCs} (ref <=4)
PLATELET COUNT: 61 10*9/L — ABNORMAL LOW (ref 150–450)
RED BLOOD CELL COUNT: 2.74 10*12/L — ABNORMAL LOW (ref 4.26–5.60)
RED CELL DISTRIBUTION WIDTH: 24.5 % — ABNORMAL HIGH (ref 12.2–15.2)
WBC ADJUSTED: 1.1 10*9/L — ABNORMAL LOW (ref 3.6–11.2)

## 2021-04-28 LAB — BASIC METABOLIC PANEL
ANION GAP: 8 mmol/L (ref 5–14)
BLOOD UREA NITROGEN: <5 mg/dL — ABNORMAL LOW (ref 9–23)
CALCIUM: 8.2 mg/dL — ABNORMAL LOW (ref 8.7–10.4)
CHLORIDE: 104 mmol/L (ref 98–107)
CO2: 27 mmol/L (ref 20.0–31.0)
CREATININE: 0.65 mg/dL
EGFR CKD-EPI (2021) MALE: >90 mL/min/{1.73_m2} (ref >=60)
GLUCOSE RANDOM: 111 mg/dL (ref 70–179)
POTASSIUM: 3.2 mmol/L — ABNORMAL LOW (ref 3.4–4.8)
SODIUM: 139 mmol/L (ref 135–145)

## 2021-04-28 LAB — HEPATIC FUNCTION PANEL
ALBUMIN: 2.4 g/dL — ABNORMAL LOW (ref 3.4–5.0)
ALKALINE PHOSPHATASE: 63 U/L (ref 46–116)
ALT (SGPT): 18 U/L (ref 10–49)
AST (SGOT): 20 U/L (ref <=34)
BILIRUBIN DIRECT: 0.2 mg/dL (ref 0.00–0.30)
BILIRUBIN TOTAL: 0.3 mg/dL (ref 0.3–1.2)
PROTEIN TOTAL: 6 g/dL (ref 5.7–8.2)

## 2021-04-28 LAB — SLIDE REVIEW

## 2021-04-28 MED ORDER — AMOXICILLIN 875 MG-POTASSIUM CLAVULANATE 125 MG TABLET
ORAL_TABLET | Freq: Two times a day (BID) | ORAL | 0 refills | 7 days | Status: CP
Start: 2021-04-28 — End: 2021-05-05
  Filled 2021-04-28: qty 14, 7d supply, fill #0

## 2021-04-28 MED ADMIN — valACYclovir (VALTREX) tablet 500 mg: 500 mg | ORAL | @ 14:00:00 | Stop: 2021-04-28

## 2021-04-28 MED ADMIN — pantoprazole (PROTONIX) EC tablet 40 mg: 40 mg | ORAL | @ 14:00:00 | Stop: 2021-04-28

## 2021-04-28 MED ADMIN — pantoprazole (PROTONIX) EC tablet 40 mg: 40 mg | ORAL | @ 01:00:00

## 2021-04-28 MED ADMIN — potassium chloride CR tablet 40 mEq: 40 meq | ORAL | @ 14:00:00 | Stop: 2021-04-28

## 2021-04-28 MED ADMIN — allopurinol (ZYLOPRIM) tablet 300 mg: 300 mg | ORAL | @ 14:00:00 | Stop: 2021-04-28

## 2021-04-28 MED ADMIN — melatonin tablet 6 mg: 6 mg | ORAL | @ 01:00:00

## 2021-04-28 MED ADMIN — cefepime (MAXIPIME) 2 g in sodium chloride 0.9 % (NS) 100 mL IVPB-connector bag: 2 g | INTRAVENOUS | @ 10:00:00 | Stop: 2021-04-28

## 2021-04-28 MED ADMIN — cefepime (MAXIPIME) 2 g in sodium chloride 0.9 % (NS) 100 mL IVPB-connector bag: 2 g | INTRAVENOUS | @ 17:00:00 | Stop: 2021-04-28

## 2021-04-28 MED ADMIN — cefepime (MAXIPIME) 2 g in sodium chloride 0.9 % (NS) 100 mL IVPB-connector bag: 2 g | INTRAVENOUS | @ 01:00:00 | Stop: 2021-05-02

## 2021-04-28 NOTE — Telephone Encounter (Signed)
04/27/21-Received incoming fmla for patient's daughter, Baltazar Pekala. Daughter needs FMLA for acute ER/inpatient admissions. I spoke with Dr. Janese Banks, who anticipated pt going to the ER/Inpt units 1-2 a month.  FMLA papers completed on 04/28/21. I will obtain Dr. Elroy Channel signature on the form, then fax the claim back to [place].

## 2021-04-28 NOTE — Unmapped (Signed)
Patient's VSS, aferbrile. IV Cefepime given, plan is to possibly dc 2/23 on oral antibiotics. Up in chair most of day, eating well. No acute changes. Wctm.  Problem: Adult Inpatient Plan of Care  Goal: Plan of Care Review  Outcome: Progressing  Goal: Patient-Specific Goal (Individualized)  Outcome: Progressing  Goal: Absence of Hospital-Acquired Illness or Injury  Outcome: Progressing  Intervention: Identify and Manage Fall Risk  Recent Flowsheet Documentation  Taken 04/27/2021 1415 by Adrienne Mocha, RN  Safety Interventions:   fall reduction program maintained   family at bedside   lighting adjusted for tasks/safety  Taken 04/27/2021 1200 by Adrienne Mocha, RN  Safety Interventions:   family at bedside   fall reduction program maintained   low bed   lighting adjusted for tasks/safety  Taken 04/27/2021 1035 by Adrienne Mocha, RN  Safety Interventions:   fall reduction program maintained   family at bedside   low bed   lighting adjusted for tasks/safety  Taken 04/27/2021 0830 by Adrienne Mocha, RN  Safety Interventions:   family at bedside   fall reduction program maintained   lighting adjusted for tasks/safety   low bed  Intervention: Prevent and Manage VTE (Venous Thromboembolism) Risk  Recent Flowsheet Documentation  Taken 04/27/2021 1415 by Adrienne Mocha, RN  Activity Management: up in chair  Taken 04/27/2021 1200 by Adrienne Mocha, RN  Activity Management: up in chair  Taken 04/27/2021 1035 by Adrienne Mocha, RN  Activity Management: up in chair  Taken 04/27/2021 0830 by Adrienne Mocha, RN  Activity Management: up in chair  Goal: Optimal Comfort and Wellbeing  Outcome: Progressing  Goal: Readiness for Transition of Care  Outcome: Progressing  Goal: Rounds/Family Conference  Outcome: Progressing

## 2021-04-28 NOTE — Unmapped (Signed)
Physician Discharge Summary [facility]  4 ONC [facility]  [address]  [place] [address]  Dept: [phone]  Loc: [phone]     Identifying Information:   Brighten Orndoff  [birth year]  [id number]    Primary Care Physician: [facility]     Code Status: Full Code    Admit Date: 04/25/2021    Discharge Date: 04/28/2021     Discharge To: Home    Discharge Service: [facility] - Oncology Floor Team (MEDO)     Discharge Attending Physician: Ihor Austin    Discharge Diagnoses:   Principal Problem:    Neutropenic fever (CMS-HCC) POA: Yes  Active Problems:    Pancytopenia (CMS-HCC) POA: Yes    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 [facility] Course:   Outpatient follow-up items:  [ ]  Oncology to follow labs 2/28, determine need for transfusion, extended duration abx treatment/resumption of prophylaxis    Hospital course:  Bobbyjoe Pabst is a 81 y.o. 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    Neutropenic Fever  Pt first fevered 2/19, presented to ED at that time without other signs or symptoms of infection, found to be neutropenic to ANC 0.4. He was treated with broad-spectrum antibiotics. CT w/o evidence for new PNA, blood and urine cultures were negative. Pt felt subjectively well and ANC recovered to 0.5 2/21. Pt was discharged 2/23 on regimen of Augmentin per primary oncologist and will follow up labs 2/28.   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 On monthly azacitidine since 09/2018 for CMML, stopped 12/30/20. Patient developed GI bleeding and worsening leukocytosis and was admitted to [facility] for management on 01/29/21. BMBx identified progression to AML. Started Aza/Ven 02/14/2021. S/p C1 Aza/Ven with BMBx revealing morphologic remission. Recent bone marrow biopsy results, showed <1% blasts, revealing CR. Due for cycle 2 Aza/Ven on 1/9. Per 1/5 note from Dr. Vertell Limber, if [facility] >=1,000, patient will require a 1-week delay for his next cycle of treatment. Plan is to coordinate future treatment cycles to be done locally with [name] at [facility].  -Allopurinol and valtrex were continued during hospital stay     GERD  - Protonix 40mg  BID      Outpatient Provider Follow Up Issues:   See above.    Touchbase with Outpatient Provider:  Warm Handoff: Completed on 04/28/21 by Cristy Hilts  (Intern) via [facility] Message    Procedures:  None  ______________________________________________________________________  Discharge Medications:      Your Medication List        START taking these medications      amoxicillin-clavulanate 875-125 mg per tablet  Commonly known as: AUGMENTIN  Take 1 tablet by mouth Two (2) times a day for 7 days.            CONTINUE taking these medications      allopurinol 300 MG tablet  Commonly known as: ZYLOPRIM  Take 1 tablet (300 mg total) by mouth daily.     furosemide 20 MG tablet  Commonly known as: LASIX  Take 20 mg by mouth Two (2) times a day.     melatonin 10 mg Cap  Take 10 mg by  mouth nightly.     pantoprazole 40 MG tablet  Commonly known as: PROTONIX  Take 1 tablet (40 mg total) by mouth Two (2) times a day.     prochlorperazine 10 MG tablet  Commonly known as: COMPAZINE  Take 1 tablet (10 mg total) by mouth every six (6) hours as needed for nausea.     traZODone 50 MG tablet  Commonly known as: DESYREL  Take 50 mg by mouth nightly.     valACYclovir 500 MG tablet  Commonly known as: VALTREX  Take 500 mg by mouth daily.     VENCLEXTA 100 mg tablet  Generic drug: venetoclax  Take 4 tablets (400 mg total) by mouth daily for 21 days of each cycle.              Allergies:  Patient has no known allergies.  ______________________________________________________________________  Pending Test Results:  Pending Labs Order Current Status    Blood Culture Preliminary result    Blood Culture Preliminary result            Most Recent Labs:  All lab results last 24 hours -   Recent Results (from the past 24 hour(s))   Type and Screen subsequent    Collection Time: 04/28/21  3:40 AM   Result Value Ref Range    Reject/Recollect REJECT    Basic Metabolic Panel    Collection Time: 04/28/21  3:40 AM   Result Value Ref Range    Sodium 139 135 - 145 mmol/L    Potassium 3.2 (L) 3.4 - 4.8 mmol/L    Chloride 104 98 - 107 mmol/L    CO2 27.0 20.0 - 31.0 mmol/L    Anion Gap 8 5 - 14 mmol/L    BUN <5 (L) 9 - 23 mg/dL    Creatinine 1.61 0.96 - 1.10 mg/dL    eGFR CKD-EPI (0454) Male >90 >=60 mL/min/1.32m2    Glucose 111 70 - 179 mg/dL    Calcium 8.2 (L) 8.7 - 10.4 mg/dL   Hepatic Function Panel    Collection Time: 04/28/21  3:40 AM   Result Value Ref Range    Albumin 2.4 (L) 3.4 - 5.0 g/dL    Total Protein 6.0 5.7 - 8.2 g/dL    Total Bilirubin 0.3 0.3 - 1.2 mg/dL    Bilirubin, Direct 0.98 0.00 - 0.30 mg/dL    AST 20 <=11 U/L    ALT 18 10 - 49 U/L    Alkaline Phosphatase 63 46 - 116 U/L   CBC w/ Differential    Collection Time: 04/28/21  3:40 AM   Result Value Ref Range    WBC 1.1 (L) 3.6 - 11.2 10*9/L    RBC 2.74 (L) 4.26 - 5.60 10*12/L    HGB 7.6 (L) 12.9 - 16.5 g/dL    HCT 91.4 (L) 78.2 - 48.0 %    MCV 83.1 77.6 - 95.7 fL    MCH 27.7 25.9 - 32.4 pg    MCHC 33.4 32.0 - 36.0 g/dL    RDW 95.6 (H) 21.3 - 15.2 %    MPV 8.3 6.8 - 10.7 fL    Platelet 61 (L) 150 - 450 10*9/L    nRBC 1 <=4 /100 WBCs    Neutrophils % 36.9 %    Lymphocytes % 46.5 %    Monocytes % 13.1 %    Eosinophils % 2.5 %    Basophils % 1.0 %    Absolute Neutrophils 0.4 (LL) 1.8 -  7.8 10*9/L    Absolute Lymphocytes 0.5 (L) 1.1 - 3.6 10*9/L    Absolute Monocytes 0.1 (L) 0.3 - 0.8 10*9/L    Absolute Eosinophils 0.0 0.0 - 0.5 10*9/L    Absolute Basophils 0.0 0.0 - 0.1 10*9/L    Anisocytosis Marked (A) Not Present   Morphology Review    Collection Time: 04/28/21  3:40 AM   Result Value Ref Range    Smear Review Comments See Comment (A) Undefined    Giant Platelets Present (A) Not Present    Burr Cells Present (A) Not Present    Dohle Bodies Present (A) Not Present    Toxic Granulation Present (A) Not Present    Toxic Vacuolation Present (A) Not Present    Poikilocytosis Moderate (A) Not Present       Relevant Studies/Radiology:  ECG 12 Lead    Result Date: 04/25/2021  SINUS TACHYCARDIA OTHERWISE NORMAL ECG WHEN COMPARED WITH ECG OF 17-Mar-2021 17:32, NONSPECIFIC T WAVE ABNORMALITY NO LONGER EVIDENT IN INFERIOR LEADS Confirmed by Freeman Caldron ([id number]) on 04/25/2021 4:20:14 PM    CT head WO contrast    Result Date: 04/25/2021  EXAM: Computed tomography, head or brain without contrast material. DATE: 04/25/2021 8:23 PM ACCESSION: [id number] UN DICTATED: 04/25/2021 8:24 PM INTERPRETATION LOCATION: [facility] CLINICAL INDICATION: 81 years old Male with altered mental status ; Mental status change, persistent or worsening  COMPARISON: None TECHNIQUE: Axial CT images of the head  from skull base to vertex without contrast. FINDINGS: No findings to suggest acute hemorrhage or acute infarct. Mild cerebral atrophy with ex vacuo dilation of ventricles. Scattered periventricular and  hypoattenuating regions, which are nonspecific but can be seen in small vessel ischemic disease. No findings to suggest mass effect. Bilateral basal ganglia calcifications. Carotid siphon and vertebral artery calcifications. Mild mucosal thickening in the right maxillary sinus and within a hyperpneumatized left sphenoid sinus. The sinuses are otherwise pneumatized. Mastoid air cells are clear. No acute fracture. Orbits are unremarkable. There is a 1.4 cm subcutaneous nodule over the right cheek (3:5), probably an epidermal inclusion cyst.     -No acute intracranial abnormality.    CT Chest Wo Contrast    Result Date: 04/26/2021  EXAM: CT CHEST WO CONTRAST DATE: 04/26/2021 2:52 PM ACCESSION: [id number] UN DICTATED: 04/26/2021 3:02 PM INTERPRETATION LOCATION: [facility] CLINICAL INDICATION: 81 year old male with neutropenic fever. COMPARISON: 03/18/2021 CTA chest. TECHNIQUE: Contiguous 0.6 and 3mm axial images were reconstructed through the chest following a single breath hold helical acquisition.  Images were reformatted in the axial and sagittal planes.  MIP slabs were also constructed. FINDINGS: LUNGS AND AIRWAYS: Previously visualized left-sided peribronchovascular groundglass nodules and consolidation are no longer present. Bibasilar subsegmental atelectasis versus scarring.  The central airways are patent. PLEURA: No pleural fluid or pneumothorax. MEDIASTINUM AND LYMPH NODES: No enlarged intrathoracic or axillary lymph nodes are present.  Small hiatal hernia. HEART AND VASCULATURE:The cardiac chambers are normal in size. Coronary artery calcifications. There is no pericardial effusion.  Ascending and descending aorta normal in caliber.  Pulmonary artery normal in size. BONES AND SOFT TISSUES: Bilateral gynecomastia. Fat-containing lesion within the right lateral abdominal wall measuring 8.3 x 6.2 x 2.3 cm (2:167, 4:87), which contains internal septations, nodular soft tissue components, and calcifications, unchanged in size compared to prior. Multilevel discogenic degenerative disease of the spine. UPPER ABDOMEN: Layering hyperdense material within the gallbladder, likely sludge. OTHER: No other significant findings.     Interval clearing of left-sided groundglass nodules and  consolidations. Otherwise, no acute findings within the chest. Incidentally noted fat-containing lesion along the right abdominal wall measuring up to 8.3 cm. This is probably a lipoma but does contain some atypical features. If imaging prior to 2023 exists, comparison would be helpful to determine stability. Alternatively, MRI with gadolinium could be performed further assessment.    XR Chest 2 views    Result Date: 04/25/2021  EXAM: XR CHEST 2 VIEWS DATE: 04/25/2021 4:08 PM ACCESSION: [id number] UN DICTATED: 04/25/2021 4:20 PM INTERPRETATION LOCATION: [facility] CLINICAL INDICATION: 81 year old male with fever.  COMPARISON: January 12. TECHNIQUE: PA and lateral views of the chest were performed. FINDINGS: The lungs are clear with no pleural fluid and a normal cardiomediastinal contour.  Stable tortuosity of the descending thoracic aorta.     No acute findings.    ______________________________________________________________________  Discharge Instructions:   Activity Instructions       Activity as tolerated                       Follow Up instructions and Outpatient Referrals     Call MD for:  difficulty breathing, headache or visual disturbances      Call MD for:  persistent nausea or vomiting      Call MD for:  severe uncontrolled pain      Call MD for:  temperature >38.5 Celsius      Discharge instructions          Appointments which have been scheduled for you      May 03, 2021  9:00 AM  (Arrive by 8:30 AM)  NURSE LAB DRAW with ADULT ONC LAB  [facility] ADULT ONCOLOGY LAB DRAW STATION [facility] REGION) [address]  [place] [address]  [phone]        May 03, 2021 10:00 AM  (Arrive by 9:30 AM)  BLOOD TRANSFUSION - 2 UNITS with [employer] CHAIR 27  [place] ONCOLOGY INFUSION [facility] REGION) [address]  [place] [address]  [phone]        May 12, 2021 11:00 AM  (Arrive by 10:30 AM)  LAB ONLY [place] with ADULT ONC LAB  [facility] ADULT ONCOLOGY LAB DRAW STATION [place] [facility] REGION) [address]  [place] [address]  [phone]        May 12, 2021 12:00 PM  (Arrive by 11:30 AM)  RETURN ACTIVE [place] with Virgil Benedict, [place]  [place] HEMATOLOGY ONCOLOGY 2ND FLR CANCER HOSP [facility] REGION) [address]  [place] [address]  [phone]        May 12, 2021  1:00 PM  (Arrive by 12:30 PM)  BLOOD TRANSFUSION - 2 UNITS with [employer] CHAIR 24  [place] ONCOLOGY INFUSION [facility] REGION) [address]  [place] [address]  [phone]        Jul 01, 2021  8:00 AM  (Arrive by 7:45 AM)  RETURN  GENERAL with Delsa Sale, MD   GI [facility] REGION) [address]  [place] [address]  [phone]             ______________________________________________________________________  Discharge Day Services:  BP 122/63  - Pulse 89  - Temp 36.7 ??C (98.1 ??F) (Oral)  - Resp 18  - Ht 189.2 cm (6' 2.5)  - Wt (!) 130.3 kg (287 lb 4.8 oz)  - SpO2 100%  -  BMI 36.39 kg/m??     Pt seen on the day of discharge and determined appropriate for discharge.    Condition at Discharge: good    Length of Discharge: I spent greater than 30 mins in the discharge of this patient.    Chloe Laurena Bering, MD  Internal Medicine PGY-1    I saw and evaluated the patient, participating in the key portions of the service on the day of discharge.  I reviewed the resident???s note and agree with the discharge plans and disposition. I personally spent >30  minutes in discharge planning services. Marc Morgans Kavan Devan, MD

## 2021-04-28 NOTE — Unmapped (Signed)
Aox4, VSS. Patient discharging this afternoon to home with oral antibiotics. Patient aware of discharge instructions and denies any questions at this time.    Problem: Fall Injury Risk  Goal: Absence of Fall and Fall-Related Injury  Intervention: Identify and Manage Contributors  Recent Flowsheet Documentation  Taken 04/28/2021 0859 by Sheran Spine, RN  Self-Care Promotion:   independence encouraged   BADL personal objects within reach  Intervention: Promote Injury-Free Environment  Recent Flowsheet Documentation  Taken 04/28/2021 [phone] by Sheran Spine, RN  Safety Interventions:   bleeding precautions   assistive device   commode/urinal/bedpan at bedside   fall reduction program maintained   infection management   isolation precautions   lighting adjusted for tasks/safety   low bed   neutropenic precautions   nonskid shoes/slippers when out of bed     Problem: Infection  Goal: Absence of Infection Signs and Symptoms  Intervention: Prevent or Manage Infection  Recent Flowsheet Documentation  Taken 04/28/2021 0859 by Sheran Spine, RN  Infection Management: aseptic technique maintained  Isolation Precautions: protective precautions maintained     Problem: Self-Care Deficit  Goal: Improved Ability to Complete Activities of Daily Living  Intervention: Promote Activity and Functional Independence  Recent Flowsheet Documentation  Taken 04/28/2021 0859 by Sheran Spine, RN  Self-Care Promotion:   independence encouraged   BADL personal objects within reach     Problem: Adult Inpatient Plan of Care  Goal: Plan of Care Review  Outcome: Resolved  Goal: Patient-Specific Goal (Individualized)  Outcome: Resolved  Goal: Absence of Hospital-Acquired Illness or Injury  Outcome: Resolved  Intervention: Identify and Manage Fall Risk  Recent Flowsheet Documentation  Taken 04/28/2021 0859 by Sheran Spine, RN  Safety Interventions:   bleeding precautions   assistive device   commode/urinal/bedpan at bedside   fall reduction program maintained   infection management   isolation precautions   lighting adjusted for tasks/safety   low bed   neutropenic precautions   nonskid shoes/slippers when out of bed  Intervention: Prevent and Manage VTE (Venous Thromboembolism) Risk  Recent Flowsheet Documentation  Taken 04/28/2021 0859 by Sheran Spine, RN  Activity Management: activity adjusted per tolerance  Intervention: Prevent Infection  Recent Flowsheet Documentation  Taken 04/28/2021 0859 by Sheran Spine, RN  Infection Prevention:   rest/sleep promoted   hand hygiene promoted  Goal: Optimal Comfort and Wellbeing  Outcome: Resolved  Goal: Readiness for Transition of Care  Outcome: Resolved  Goal: Rounds/Family Conference  Outcome: Resolved

## 2021-04-28 NOTE — Unmapped (Signed)
Alert,oriented.not in pain.Possible discharge today.No acute event noted during shift.  Problem: Adult Inpatient Plan of Care  Goal: Plan of Care Review  Outcome: Progressing  Goal: Patient-Specific Goal (Individualized)  Outcome: Progressing  Goal: Absence of Hospital-Acquired Illness or Injury  Outcome: Progressing  Intervention: Identify and Manage Fall Risk  Recent Flowsheet Documentation  Taken 04/28/2021 0400 by Michelle Nasuti, RN  Safety Interventions:   commode/urinal/bedpan at bedside   environmental modification   fall reduction program maintained   low bed   lighting adjusted for tasks/safety   nonskid shoes/slippers when out of bed  Taken 04/27/2021 2000 by Michelle Nasuti, RN  Safety Interventions:   commode/urinal/bedpan at bedside   environmental modification   fall reduction program maintained   low bed   lighting adjusted for tasks/safety   nonskid shoes/slippers when out of bed  Intervention: Prevent and Manage VTE (Venous Thromboembolism) Risk  Recent Flowsheet Documentation  Taken 04/28/2021 0400 by Michelle Nasuti, RN  Activity Management: activity adjusted per tolerance  Taken 04/27/2021 2000 by Michelle Nasuti, RN  Activity Management: up in chair  Goal: Optimal Comfort and Wellbeing  Outcome: Progressing  Goal: Readiness for Transition of Care  Outcome: Progressing  Goal: Rounds/Family Conference  Outcome: Progressing

## 2021-04-29 ENCOUNTER — Inpatient Hospital Stay: Payer: Medicare Other

## 2021-04-29 NOTE — Telephone Encounter (Signed)
Fmla faxed to [employer]. Fax confirmation [email or web address]. Copy of form sent to HIM to be scanned into patient's chart.

## 2021-04-29 NOTE — Unmapped (Signed)
[facility] Refill Coordination Note    Specialty Medication(s) to be Shipped:   Denied refill    Other medication(s) to be shipped: No additional medications requested for fill at this time     Gary Baird, DOB: [birth year]  Phone: [phone] (home)       All above HIPAA information was verified with patient's family member, Daughter.     Was a Nurse, learning disability used for this call? No    Completed refill call assessment today to schedule patient's medication shipment from the [facility] [phone]).  All relevant notes have been reviewed.     Specialty medication(s) and dose(s) confirmed: Regimen is correct and unchanged.   Changes to medications: Gary Baird reports no changes at this time.  Changes to insurance: No  New side effects reported not previously addressed with a pharmacist or physician: None reported  Questions for the pharmacist: No    Confirmed patient received a Conservation officer, historic buildings and a Surveyor, mining with first shipment. The patient will receive a drug information handout for each medication shipped and additional FDA Medication Guides as required.       DISEASE/MEDICATION-SPECIFIC INFORMATION        N/A    SPECIALTY MEDICATION ADHERENCE     Medication Adherence    Specialty Medication: Venclexta 100mg   Patient is on additional specialty medications: No  Informant: child/children              Were doses missed due to medication being on hold? No    Venclexta 100 mg: 21 days of medicine on hand       REFERRAL TO PHARMACIST     Referral to the pharmacist: Not needed      [facility]     Shipping address confirmed in Epic.     Delivery Scheduled: Patient declined refill at this time due to Has been in hospital and didn't start cycle.     Medication will be delivered via n/a to the n/a address in Epic WAM.    Gary Baird   [facility] Specialty Technician

## 2021-04-29 NOTE — Unmapped (Signed)
PACT Study Physical Therapy Assessments    PACT Study (IRB [phone])  Date: 04/27/2021   Start Time: 1055  End Time: 1115  Setting: Inpatient on 4ONC  Cycle #: 2  Cycle Day: 18    Patient Subjective & Discussion:  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 He's also been going to church. He denies falls. He does report he has not been doing his LE exercise program, but unable to provide reason. He's agreeable to attempting his LE exercise program again at home and reports the prescribed exercises still feel appropriate to him.  [name] was watching NFL game highlights during the assessment and is a Garment/textile technologist. He would love to go to the Super Bowl at some point. He's eager to go home. Does not c/o pain.          PT Assessments     AMPAC Short Form: 5 Click                Completed: Yes                                                                          Assistive device used during assessment  None       Item Score    Difficulty turning over in bed? 4   Difficulty sitting down/standing up from chair with arms? 4   Difficulty moving from supine to sitting on the edge of the bed? 4   Help moving to and from bed from wheelchair? 4   Help currently needed to walk? 4   Raw Score 20/20   T-Scale Score    CMS 0-100% Score    AMPAC Assessment:          Timed Up & Go (TUG)       Completed: No                                                               Assistive device used during assessment None      Time     TUG Assessment:   Deferred due to patient fatigue       Berg Balance Scale    Completed: Yes       Item Score   Sitting unsupported 3   Sitting to standing  3   Standing to sitting 4   Pivot transfers 2   Standing unsupported 3   Standing with eyes closed 3   Standing with feet together 1   Tandem standing  1   Turning to look behind  3   Retrieving object from floor 2   Turning 360?  2   Placing alternating feet on stool 1   Reaching forward with outstretched arms 0   Standing on one foot 0   Total Score 28/56   Berg Balance Scale Assessment:      With a score of 28/56 [name]  is considered to be at a medium fall risk       6 Minute Walk Test     Completed: No      Assistive device used during assessment None    Distance   meters   Pre-Vitals:  BP:  110/58 Post Vitals: BP:  126/62    HR:  98  HR:  101    SpO2: 99% RA  SpO2: 100% RA    RPE:  1  RPE:  2   Assessment:     Deferred 2/2 patient fatigue     30 Second Sit to Stand    Completed: Yes      Repetitions  0 reps   30 Second Sit to Stand Assessment:  [name] was able to complete 5 STS transfers within 30 seconds, but used b/l UE to push up from chair. Per 30 STS instructions 0 reps should be recording because he used his arms. [name] was unable to stand without using his arms. This score is below average for his age.     PT Goals:     [name] will ambulate x 15 minutes with RPE < 3/10 within 6 wks

## 2021-05-02 ENCOUNTER — Inpatient Hospital Stay: Payer: Medicare Other

## 2021-05-02 ENCOUNTER — Encounter: Payer: Self-pay | Admitting: Oncology

## 2021-05-03 ENCOUNTER — Other Ambulatory Visit: Admit: 2021-05-03 | Discharge: 2021-05-04 | Payer: MEDICARE

## 2021-05-03 ENCOUNTER — Ambulatory Visit: Admit: 2021-05-03 | Discharge: 2021-05-04 | Payer: MEDICARE

## 2021-05-03 ENCOUNTER — Encounter
Admit: 2021-05-03 | Discharge: 2021-05-04 | Payer: MEDICARE | Attending: Hematology & Oncology | Primary: Hematology & Oncology

## 2021-05-03 ENCOUNTER — Inpatient Hospital Stay: Payer: Medicare Other

## 2021-05-03 DIAGNOSIS — C9201 Acute myeloblastic leukemia, in remission: Principal | ICD-10-CM

## 2021-05-03 DIAGNOSIS — C9310 Chronic myelomonocytic leukemia not having achieved remission: Secondary | ICD-10-CM | POA: Diagnosis not present

## 2021-05-03 LAB — SLIDE REVIEW

## 2021-05-03 LAB — CBC W/ AUTO DIFF
BASOPHILS ABSOLUTE COUNT: 0 10*9/L (ref 0.0–0.1)
BASOPHILS RELATIVE PERCENT: 0.8 %
EOSINOPHILS ABSOLUTE COUNT: 0 10*9/L (ref 0.0–0.5)
EOSINOPHILS RELATIVE PERCENT: 1.8 %
HEMATOCRIT: 23.9 % — ABNORMAL LOW (ref 39.0–48.0)
HEMOGLOBIN: 7.8 g/dL — ABNORMAL LOW (ref 12.9–16.5)
LYMPHOCYTES ABSOLUTE COUNT: 0.7 10*9/L — ABNORMAL LOW (ref 1.1–3.6)
LYMPHOCYTES RELATIVE PERCENT: 46.4 %
MEAN CORPUSCULAR HEMOGLOBIN CONC: 32.9 g/dL (ref 32.0–36.0)
MEAN CORPUSCULAR HEMOGLOBIN: 27.6 pg (ref 25.9–32.4)
MEAN CORPUSCULAR VOLUME: 84.1 fL (ref 77.6–95.7)
MEAN PLATELET VOLUME: 8.6 fL (ref 6.8–10.7)
MONOCYTES ABSOLUTE COUNT: 0.2 10*9/L — ABNORMAL LOW (ref 0.3–0.8)
MONOCYTES RELATIVE PERCENT: 13.4 %
NEUTROPHILS ABSOLUTE COUNT: 0.6 10*9/L — ABNORMAL LOW (ref 1.8–7.8)
NEUTROPHILS RELATIVE PERCENT: 37.6 %
PLATELET COUNT: 236 10*9/L (ref 150–450)
RED BLOOD CELL COUNT: 2.84 10*12/L — ABNORMAL LOW (ref 4.26–5.60)
RED CELL DISTRIBUTION WIDTH: 24.5 % — ABNORMAL HIGH (ref 12.2–15.2)
WBC ADJUSTED: 1.6 10*9/L — ABNORMAL LOW (ref 3.6–11.2)

## 2021-05-03 LAB — COMPREHENSIVE METABOLIC PANEL
ALBUMIN: 2.9 g/dL — ABNORMAL LOW (ref 3.4–5.0)
ALKALINE PHOSPHATASE: 80 U/L (ref 46–116)
ALT (SGPT): 28 U/L (ref 10–49)
ANION GAP: 9 mmol/L (ref 5–14)
AST (SGOT): 26 U/L (ref <=34)
BILIRUBIN TOTAL: 0.3 mg/dL (ref 0.3–1.2)
BLOOD UREA NITROGEN: <5 mg/dL — ABNORMAL LOW (ref 9–23)
CALCIUM: 8.9 mg/dL (ref 8.7–10.4)
CHLORIDE: 105 mmol/L (ref 98–107)
CO2: 29 mmol/L (ref 20.0–31.0)
CREATININE: 0.58 mg/dL — ABNORMAL LOW
EGFR CKD-EPI (2021) MALE: >90 mL/min/{1.73_m2} (ref >=60)
GLUCOSE RANDOM: 109 mg/dL (ref 70–179)
POTASSIUM: 3.5 mmol/L (ref 3.4–4.8)
PROTEIN TOTAL: 6.6 g/dL (ref 5.7–8.2)
SODIUM: 143 mmol/L (ref 135–145)

## 2021-05-03 NOTE — Unmapped (Signed)
PT in lab for PIV placement, 22 gauge placed in left forearm by DM RN, labs obtained.  Blood return brisk, saline locked.

## 2021-05-03 NOTE — Unmapped (Signed)
Patient presents to unit for blood transfusion. Resting in chair with call light in reach. 2 units pRBCs given per orders without complications, VSS. Patient discharged ambulatory.

## 2021-05-03 NOTE — Unmapped (Signed)
[facility] Navigation: Follow-Up     Oncology Patient Navigator (OPN) addressed various questions from pt's dtr via MyChart re: LLS Co-pay Assistance funding. Pt was approved on 02/11/2021 and has received multiple letters from LLS although pt's dtr has not seen them. OPN provided necessary information and contacted [facility] financial navigation for further assistance with potential submission of claims to keep pt's LLS' account active. Will continue to communicate with pt's dtr as needed.    Additional follow-up call will occur in the coming weeks.

## 2021-05-04 NOTE — Unmapped (Signed)
Hospital Outpatient Visit on 05/03/2021   Component Date Value Ref Range Status    Crossmatch 05/03/2021 Compatible   Final    Unit Blood Type 05/03/2021 B Pos   Final    ISBT Number 05/03/2021 7300   Final    Unit # 05/03/2021 [id number]   Final    Status 05/03/2021 Issued   Final    Spec Expiration 05/03/2021 [id number]   Final    Product ID 05/03/2021 Red Blood Cells   Final    PRODUCT CODE 05/03/2021 [id number]   Final    Crossmatch 05/03/2021 Compatible   Final    Unit Blood Type 05/03/2021 B Neg   Final    ISBT Number 05/03/2021 1700   Final    Unit # 05/03/2021 [id number]   Final    Status 05/03/2021 Issued   Final    Spec Expiration 05/03/2021 [id number]   Final    Product ID 05/03/2021 Red Blood Cells   Final    PRODUCT CODE 05/03/2021 [id number]   Final   Lab on 05/03/2021   Component Date Value Ref Range Status    Sodium 05/03/2021 143  135 - 145 mmol/L Final    Potassium 05/03/2021 3.5  3.4 - 4.8 mmol/L Final    Chloride 05/03/2021 105  98 - 107 mmol/L Final    CO2 05/03/2021 29.0  20.0 - 31.0 mmol/L Final    Anion Gap 05/03/2021 9  5 - 14 mmol/L Final    BUN 05/03/2021 <5 (L)  9 - 23 mg/dL Final    Creatinine 53/66/4403 0.58 (L)  0.60 - 1.10 mg/dL Final    eGFR CKD-EPI (2021) Male 05/03/2021 >90  >=60 mL/min/1.21m2 Final    eGFR calculated with CKD-EPI 2021 equation in accordance with [employer] and [employer] of Nephrology Task Force recommendations.    Glucose 05/03/2021 109  70 - 179 mg/dL Final    Calcium 47/42/5956 8.9  8.7 - 10.4 mg/dL Final    Albumin 38/75/6433 2.9 (L)  3.4 - 5.0 g/dL Final    Total Protein 05/03/2021 6.6  5.7 - 8.2 g/dL Final    Total Bilirubin 05/03/2021 0.3  0.3 - 1.2 mg/dL Final    AST 29/51/8841 26  <=34 U/L Final    ALT 05/03/2021 28  10 - 49 U/L Final    Alkaline Phosphatase 05/03/2021 80  46 - 116 U/L Final    Blood Type 05/03/2021 B POS   Final    Antibody Screen 05/03/2021 NEG   Final    WBC 05/03/2021 1.6 (L)  3.6 - 11.2 10*9/L Final    RBC 05/03/2021 2.84 (L)  4.26 - 5.60 10*12/L Final    HGB 05/03/2021 7.8 (L)  12.9 - 16.5 g/dL Final    HCT 66/08/3014 23.9 (L)  39.0 - 48.0 % Final    MCV 05/03/2021 84.1  77.6 - 95.7 fL Final    MCH 05/03/2021 27.6  25.9 - 32.4 pg Final    MCHC 05/03/2021 32.9  32.0 - 36.0 g/dL Final    RDW 03/14/3233 24.5 (H)  12.2 - 15.2 % Final    MPV 05/03/2021 8.6  6.8 - 10.7 fL Final    Platelet 05/03/2021 236  150 - 450 10*9/L Final    Neutrophils % 05/03/2021 37.6  % Final    Lymphocytes % 05/03/2021 46.4  % Final    Monocytes % 05/03/2021 13.4  % Final    Eosinophils % 05/03/2021 1.8  % Final  Basophils % 05/03/2021 0.8  % Final    Absolute Neutrophils 05/03/2021 0.6 (L)  1.8 - 7.8 10*9/L Final    Absolute Lymphocytes 05/03/2021 0.7 (L)  1.1 - 3.6 10*9/L Final    Absolute Monocytes 05/03/2021 0.2 (L)  0.3 - 0.8 10*9/L Final    Absolute Eosinophils 05/03/2021 0.0  0.0 - 0.5 10*9/L Final    Absolute Basophils 05/03/2021 0.0  0.0 - 0.1 10*9/L Final    Anisocytosis 05/03/2021 Marked (A)  Not Present Final    Smear Review Comments 05/03/2021 See Comment (A)  Undefined Final    [id number] Slide reviewed. Myelocytes present-rare. Large platelets present. Irregularly contracted RBCs present.        Giant Platelets 05/03/2021 Present (A)  Not Present Final    Toxic Vacuolation 05/03/2021 Present (A)  Not Present Final

## 2021-05-06 ENCOUNTER — Encounter (INDEPENDENT_AMBULATORY_CARE_PROVIDER_SITE_OTHER): Payer: Self-pay | Admitting: Vascular Surgery

## 2021-05-06 ENCOUNTER — Telehealth: Payer: Self-pay

## 2021-05-06 NOTE — Telephone Encounter (Signed)
I called to speak with the patient to let him know that we have scheduled  him to come in on Monday 3/6 for labs only. Patient stated he [facility] at [facility] and then has another appointment with [facility] on [facility]. Nurse Judeen Hammans said she would reach back out to him after she speaks with dr.rao in regards to this appointment. I also stated to the patient that Dr.rao did not feel comfortable about signing letter for him to not wear a seatbelt on the days he gets an injection. Patient was not happy but stated he will get his family doctor to do so because they have done it before.  ?

## 2021-05-08 ENCOUNTER — Other Ambulatory Visit: Payer: Self-pay | Admitting: Oncology

## 2021-05-09 ENCOUNTER — Telehealth: Payer: Self-pay | Admitting: *Deleted

## 2021-05-09 ENCOUNTER — Other Ambulatory Visit: Payer: Medicare Other

## 2021-05-09 ENCOUNTER — Telehealth: Payer: Self-pay

## 2021-05-09 ENCOUNTER — Inpatient Hospital Stay: Payer: Medicare Other

## 2021-05-09 ENCOUNTER — Other Ambulatory Visit: Payer: Self-pay | Admitting: *Deleted

## 2021-05-09 ENCOUNTER — Encounter: Payer: Self-pay | Admitting: Oncology

## 2021-05-09 DIAGNOSIS — C9201 Acute myeloblastic leukemia, in remission: Secondary | ICD-10-CM

## 2021-05-09 NOTE — Telephone Encounter (Signed)
Called pt earlier today and asked him if he can come for labs tom. And he can if it is early. He has a funeral to go to. I told him that Dr. Janese Banks was trying to talk to him and decide what is going on. I told him that they spoke and you should get labs here at 8:45 and he is agreeable. I told him I will call and see what is suppose to go  on with appt 3/9 at [facility]/ I called and spoke to staff and he was to get lbas, then get Bone marrow bx and then possible blood transfusion. Dr. Janese Banks and Dr. Janene Madeira spoke and said that they will look at the labs tomorrow and then if the labs are good then we will cancel the bone marrow bx. If the labs are still low with his white blood count then he will go and get the bone marrow bx. I have left this on his voicemail.I gave him my cell phone to call me back ?

## 2021-05-10 ENCOUNTER — Inpatient Hospital Stay: Payer: Medicare Other | Attending: Nurse Practitioner

## 2021-05-10 ENCOUNTER — Telehealth: Payer: Self-pay | Admitting: *Deleted

## 2021-05-10 ENCOUNTER — Other Ambulatory Visit: Payer: Self-pay | Admitting: Oncology

## 2021-05-10 ENCOUNTER — Other Ambulatory Visit: Payer: Self-pay

## 2021-05-10 DIAGNOSIS — D509 Iron deficiency anemia, unspecified: Secondary | ICD-10-CM

## 2021-05-10 DIAGNOSIS — C9201 Acute myeloblastic leukemia, in remission: Secondary | ICD-10-CM | POA: Insufficient documentation

## 2021-05-10 DIAGNOSIS — Z79899 Other long term (current) drug therapy: Secondary | ICD-10-CM | POA: Diagnosis not present

## 2021-05-10 DIAGNOSIS — H269 Unspecified cataract: Secondary | ICD-10-CM | POA: Diagnosis not present

## 2021-05-10 LAB — CBC WITH DIFFERENTIAL/PLATELET
Abs Immature Granulocytes: 0.09 10*3/uL — ABNORMAL HIGH (ref 0.00–0.07)
Basophils Absolute: 0 10*3/uL (ref 0.0–0.1)
Basophils Relative: 0 %
Eosinophils Absolute: 0 10*3/uL (ref 0.0–0.5)
Eosinophils Relative: 0 %
HCT: 27.2 % — ABNORMAL LOW (ref 39.0–52.0)
Hemoglobin: 8.3 g/dL — ABNORMAL LOW (ref 13.0–17.0)
Immature Granulocytes: 3 %
Lymphocytes Relative: 33 %
Lymphs Abs: 1 10*3/uL (ref 0.7–4.0)
MCH: 28.9 pg (ref 26.0–34.0)
MCHC: 30.5 g/dL (ref 30.0–36.0)
MCV: 94.8 fL (ref 80.0–100.0)
Monocytes Absolute: 0.7 10*3/uL (ref 0.1–1.0)
Monocytes Relative: 23 %
Neutro Abs: 1.2 10*3/uL — ABNORMAL LOW (ref 1.7–7.7)
Neutrophils Relative %: 41 %
Platelets: 142 10*3/uL — ABNORMAL LOW (ref 150–400)
RBC: 2.87 MIL/uL — ABNORMAL LOW (ref 4.22–5.81)
RDW: 25.2 % — ABNORMAL HIGH (ref 11.5–15.5)
WBC: 3 10*3/uL — ABNORMAL LOW (ref 4.0–10.5)
nRBC: 0.7 % — ABNORMAL HIGH (ref 0.0–0.2)

## 2021-05-10 LAB — COMPREHENSIVE METABOLIC PANEL
ALT: 11 U/L (ref 0–44)
AST: 14 U/L — ABNORMAL LOW (ref 15–41)
Albumin: 3.3 g/dL — ABNORMAL LOW (ref 3.5–5.0)
Alkaline Phosphatase: 79 U/L (ref 38–126)
Anion gap: 6 (ref 5–15)
BUN: 8 mg/dL (ref 8–23)
CO2: 29 mmol/L (ref 22–32)
Calcium: 8.6 mg/dL — ABNORMAL LOW (ref 8.9–10.3)
Chloride: 102 mmol/L (ref 98–111)
Creatinine, Ser: 0.71 mg/dL (ref 0.61–1.24)
GFR, Estimated: >60 mL/min (ref >60)
Glucose, Bld: 105 mg/dL — ABNORMAL HIGH (ref 70–99)
Potassium: 3.7 mmol/L (ref 3.5–5.1)
Sodium: 137 mmol/L (ref 135–145)
Total Bilirubin: 0.2 mg/dL — ABNORMAL LOW (ref 0.3–1.2)
Total Protein: 6.6 g/dL (ref 6.5–8.1)

## 2021-05-10 LAB — SAMPLE TO BLOOD BANK

## 2021-05-10 NOTE — Telephone Encounter (Signed)
I called earlier in the day to tell pt that wbc is 3 and Dr. Janese Banks has spoke to Dr. Janene Madeira at [facility] and he should start back on vidaza. His appt for [facility] 3/9 has been cancelled because his labs got better. Wanted pt to started chemo tom. Arrival 8 am. All this was left in message voicemail. I tried later in the day and still did not get pt. And I called his daughter Doree Albee. I spoke to her and told her the info above. Also he needs to start the venetoclax when he starts the [place]. It is 4 tablets daily for 21 days. Later the pt. Called me back and I told him the same thing. Then right before closing of cancer center I realized that we needed to check with pharmacy for drug. They do not have any in stock and we will need to order it and pt can start the treatment on Thursday of this week at 2pm. I coould not get the pt. But I spoke with his daughter and she will let him know that we can't start tom. But will do I thursday.  ?

## 2021-05-11 ENCOUNTER — Inpatient Hospital Stay: Payer: Medicare Other

## 2021-05-11 ENCOUNTER — Inpatient Hospital Stay: Payer: Medicare Other | Admitting: Oncology

## 2021-05-12 ENCOUNTER — Other Ambulatory Visit: Payer: Self-pay

## 2021-05-12 ENCOUNTER — Inpatient Hospital Stay: Payer: Medicare Other

## 2021-05-12 ENCOUNTER — Telehealth: Payer: Self-pay

## 2021-05-12 VITALS — BP 124/66 | HR 97 | Temp 98.2°F | Resp 20

## 2021-05-12 DIAGNOSIS — H269 Unspecified cataract: Secondary | ICD-10-CM | POA: Diagnosis not present

## 2021-05-12 DIAGNOSIS — C9201 Acute myeloblastic leukemia, in remission: Secondary | ICD-10-CM | POA: Diagnosis not present

## 2021-05-12 DIAGNOSIS — Z79899 Other long term (current) drug therapy: Secondary | ICD-10-CM | POA: Diagnosis not present

## 2021-05-12 DIAGNOSIS — C9310 Chronic myelomonocytic leukemia not having achieved remission: Secondary | ICD-10-CM

## 2021-05-12 MED ORDER — AZACITIDINE CHEMO SQ INJECTION
75.0000 mg/m2 | Freq: Once | INTRAMUSCULAR | Status: AC
  Administered 2021-05-12: 15:00:00 200 mg via SUBCUTANEOUS
  Filled 2021-05-12: qty 8

## 2021-05-12 MED ORDER — ONDANSETRON HCL 4 MG PO TABS
8.0000 mg | ORAL_TABLET | Freq: Once | ORAL | Status: AC
  Administered 2021-05-12: 14:00:00 8 mg via ORAL
  Filled 2021-05-12: qty 2

## 2021-05-12 NOTE — Telephone Encounter (Signed)
Pt came in the clinic and requested for Dr Humphrey Rolls to re-do his note for him not to have to wear his seatbelt due to him having injections in his abdomen.  I spoke to Dr Humphrey Rolls and she advised that we can do that for the pt.  I placed the note on Tat's desk for her to type it up and DFK will sign it ?

## 2021-05-12 NOTE — Telephone Encounter (Signed)
error 

## 2021-05-12 NOTE — Progress Notes (Signed)
ANC 1.2 ok to treat per md ?

## 2021-05-12 NOTE — Unmapped (Signed)
[facility] Navigation: Follow-Up  ??  Oncology Patient Navigator (OPN) addressed various questions from pt's dtr via MyChart re: LLS Co-pay Assistance funding. Financial Navigation submitted claims on 05/10/2021 per request which kept patient's account active. Patient's dtr reported further questions about future claims to which OPN addressed and will continue to communicate with patient's dtr as needed.      Patient's dtr denied any additional needs at this time and verbalized understanding of how to contact OPN if needs arise in the future. No additional follow-up scheduled; episode will be resolved.     Please message our program through In [facility] Oncology Navigation) as appropriate/needed.

## 2021-05-13 ENCOUNTER — Inpatient Hospital Stay (HOSPITAL_BASED_OUTPATIENT_CLINIC_OR_DEPARTMENT_OTHER): Payer: Medicare Other | Admitting: Oncology

## 2021-05-13 ENCOUNTER — Encounter (INDEPENDENT_AMBULATORY_CARE_PROVIDER_SITE_OTHER): Payer: Self-pay | Admitting: Vascular Surgery

## 2021-05-13 ENCOUNTER — Ambulatory Visit: Payer: Medicare Other

## 2021-05-13 ENCOUNTER — Inpatient Hospital Stay: Payer: Medicare Other

## 2021-05-13 ENCOUNTER — Ambulatory Visit (INDEPENDENT_AMBULATORY_CARE_PROVIDER_SITE_OTHER): Payer: Medicare Other | Admitting: Vascular Surgery

## 2021-05-13 ENCOUNTER — Encounter: Payer: Self-pay | Admitting: Oncology

## 2021-05-13 VITALS — BP 117/56 | HR 100 | Temp 97.2°F | Resp 20 | Wt 292.5 lb

## 2021-05-13 VITALS — BP 117/76 | HR 90 | Resp 16 | Ht 74.5 in | Wt 298.4 lb

## 2021-05-13 DIAGNOSIS — A809 Acute poliomyelitis, unspecified: Secondary | ICD-10-CM

## 2021-05-13 DIAGNOSIS — R7303 Prediabetes: Secondary | ICD-10-CM | POA: Diagnosis not present

## 2021-05-13 DIAGNOSIS — H269 Unspecified cataract: Secondary | ICD-10-CM | POA: Diagnosis not present

## 2021-05-13 DIAGNOSIS — I10 Essential (primary) hypertension: Secondary | ICD-10-CM

## 2021-05-13 DIAGNOSIS — N179 Acute kidney failure, unspecified: Secondary | ICD-10-CM | POA: Insufficient documentation

## 2021-05-13 DIAGNOSIS — C9201 Acute myeloblastic leukemia, in remission: Secondary | ICD-10-CM

## 2021-05-13 DIAGNOSIS — M7989 Other specified soft tissue disorders: Secondary | ICD-10-CM

## 2021-05-13 DIAGNOSIS — I2510 Atherosclerotic heart disease of native coronary artery without angina pectoris: Secondary | ICD-10-CM | POA: Insufficient documentation

## 2021-05-13 DIAGNOSIS — Z79899 Other long term (current) drug therapy: Secondary | ICD-10-CM | POA: Diagnosis not present

## 2021-05-13 DIAGNOSIS — Z5111 Encounter for antineoplastic chemotherapy: Secondary | ICD-10-CM

## 2021-05-13 DIAGNOSIS — C9310 Chronic myelomonocytic leukemia not having achieved remission: Secondary | ICD-10-CM

## 2021-05-13 MED ORDER — ONDANSETRON HCL 4 MG PO TABS
8.0000 mg | ORAL_TABLET | Freq: Once | ORAL | Status: AC
  Administered 2021-05-13: 8 mg via ORAL
  Filled 2021-05-13: qty 2

## 2021-05-13 MED ORDER — AZACITIDINE CHEMO SQ INJECTION
75.0000 mg/m2 | Freq: Once | INTRAMUSCULAR | Status: AC
  Administered 2021-05-13: 200 mg via SUBCUTANEOUS
  Filled 2021-05-13: qty 8

## 2021-05-13 NOTE — Assessment & Plan Note (Signed)
blood pressure control important in reducing the progression of atherosclerotic disease. On appropriate oral medications.  

## 2021-05-13 NOTE — Assessment & Plan Note (Signed)
blood glucose control important in reducing the progression of atherosclerotic disease. Also, involved in wound healing. On appropriate medications.  

## 2021-05-13 NOTE — Patient Instructions (Signed)
[facility] [place]   ?Discharge Instructions: ?Thank you for choosing [facility] to provide your oncology and hematology care.  ?If you have a lab appointment with the [facility], please go directly to the [facility] and check in at the registration area. ?  ?We strive to give you quality time with your provider. You may need to reschedule your appointment if you arrive late (15 or more minutes).  Arriving late affects you and other patients whose appointments are after yours.  Also, if you miss three or more appointments without notifying the office, you may be dismissed from the clinic at the provider?s discretion.    ?  ?For prescription refill requests, have your pharmacy contact our office and allow 72 hours for refills to be completed.   ? ?Today you received the following chemotherapy and/or immunotherapy agents: Vidaza.    ?  ?To help prevent nausea and vomiting after your treatment, we encourage you to take your nausea medication as directed. ? ?BELOW ARE SYMPTOMS THAT SHOULD BE REPORTED IMMEDIATELY: ?*FEVER GREATER THAN 100.4 F (38 ?C) OR HIGHER ?*CHILLS OR SWEATING ?*NAUSEA AND VOMITING THAT IS NOT CONTROLLED WITH YOUR NAUSEA MEDICATION ?*UNUSUAL SHORTNESS OF BREATH ?*UNUSUAL BRUISING OR BLEEDING ?*URINARY PROBLEMS (pain or burning when urinating, or frequent urination) ?*BOWEL PROBLEMS (unusual diarrhea, constipation, pain near the anus) ?TENDERNESS IN MOUTH AND THROAT WITH OR WITHOUT PRESENCE OF ULCERS (sore throat, sores in mouth, or a toothache) ?UNUSUAL RASH, SWELLING OR PAIN  ?UNUSUAL VAGINAL DISCHARGE OR ITCHING  ? ?Items with * indicate a potential emergency and should be followed up as soon as possible or go to the Emergency Department if any problems should occur. ? ?Please show the CHEMOTHERAPY ALERT CARD or IMMUNOTHERAPY ALERT CARD at check-in to the Emergency Department and triage nurse. ? ?Should you have questions after your visit or need to  cancel or reschedule your appointment, please contact [facility] [place]  Dept: [phone]  and follow the prompts.  Office hours are 8:00 a.m. to 4:30 p.m. Monday - Friday. Please note that voicemails left after 4:00 p.m. may not be returned until the following business day.  We are closed weekends and major holidays. You have access to a nurse at all times for urgent questions. Please call the main number to the clinic Dept: [phone] and follow the prompts. ? ?For any non-urgent questions, you may also contact your provider using MyChart. We now offer e-Visits for anyone 23 and older to request care online for non-urgent symptoms. For details visit [email or web address]. ?  ?Also download the MyChart app! Go to the app store, search "MyChart", open the app, select [facility], and log in with your MyChart username and password. ? ?Due to Covid, a mask is required upon entering the hospital/clinic. If you do not have a mask, one will be given to you upon arrival. For doctor visits, patients may have 1 support person aged 69 or older with them. For treatment visits, patients cannot have anyone with them due to current Covid guidelines and our immunocompromised population.  ?

## 2021-05-13 NOTE — Progress Notes (Signed)
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  He has recently been having bilateral knee pain and has undergone steroid injection in his knee.Patient was noted to have a high white count on his routine exam.  His white count was 19.2 on 07/22/2018 with an H&H of 11.4/35.1 and a platelet count of 470.  At that point he was prescribed a course of antibiotics and a repeat blood work on 08/21/2018 showed white count of 42.3, H&H of 7.1/23 with an MCV of 84 and a platelet count of 791 and hence the patient was referred to hematology for further management.     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  In addition the core biopsy showed a small focus of fibrosis containing scattering of mast cells and eosinophils most suggestive of systemic mastocytosis.bone marrow blasts 7%.  Flow cytometry showed increased number of monocytic cells representing 16% of all cells with expression for HLA-DR, CD11b, CD13, CD14, CD33, CD38, CD56 and CD56 and CD64 with [facility] CD 117 are CD34. normal cytogenetics, CBL, SRSF2, SH2B3 and TET2    Repeat bone marrow biopsy after 4 cycles of Vidaza showed less pronounced monocytic/granulocytic proliferation and overall better granulopoiesis and no increase in blasts.  In addition features of systemic mastocytosis not present.   After  9 cycles of Vidaza patient was noted to have significant anemia and worsening thrombocytosis for which a repeat bone marrow biopsy was done.  Bone marrow did not show any features of progression of CMML.  Overall stable findings and no increased blast.He was found to have iron deficiency and received 2 doses of Feraheme.   Patient seen by [facility] GI and underwent EGD and colonoscopy for iron deficiency anemia.Colonoscopy showed nonbleeding internal hemorrhoids.  Normal mucosa in the colon.  No polyps EGD showed normal esophagus and gastric mucosal atrophy.  No stigmata of bleeding   Patient presented to the ER in November 2022 with significant fatigue and found to have an anemia with a hemoglobin of 5 and significant hyperleukocytosis.  He was transferred to [facility] where it was confirmed that he had transformed to AML.  He was seen by Dr. Janene Madeira as an outpatient and started venetoclax plus Vidaza on 02/14/2021.  Bone marrow biopsy after cycle 1 showed morphologic remission    Interval history-patient reports decreasing visual acuity and would like to get his cataract surgery done soon.  ECOG PS- 1 Pain scale- 0   Review of systems- Review of Systems  Constitutional:  Negative for chills, fever, malaise/fatigue and weight loss.  HENT:  Negative for congestion, ear discharge and nosebleeds.   Eyes:  Negative for blurred vision.  Respiratory:  Negative for cough, hemoptysis, sputum production, shortness of breath and wheezing.   Cardiovascular:  Negative for chest pain, palpitations, orthopnea and claudication.  Gastrointestinal:  Negative for abdominal pain, blood in stool, constipation, diarrhea, heartburn, melena, nausea and vomiting.  Genitourinary:  Negative for dysuria, flank pain, frequency, hematuria and urgency.  Musculoskeletal:  Negative for back pain, joint pain and myalgias.  Skin:  Negative for rash.  Neurological:  Negative for dizziness, tingling, focal weakness, seizures,  weakness and headaches.  Endo/Heme/Allergies:  Does not bruise/bleed easily.  Psychiatric/Behavioral:  Negative for depression and suicidal ideas. The patient does not have insomnia.       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[place])    Dyspnea    Hypertension      Past Surgical History:  Procedure Laterality Date   COLONOSCOPY N/A 08/28/2019   Procedure: COLONOSCOPY;  Surgeon: Toledo, Benay Pike, MD;  Location: [facility] ENDOSCOPY;  Service: Gastroenterology;  Laterality: N/A;   COLONOSCOPY WITH PROPOFOL N/A 05/03/2015   Procedure: COLONOSCOPY WITH PROPOFOL;  Surgeon: [name], MD;  Location: [facility] ENDOSCOPY;  Service: Gastroenterology;  Laterality: N/A;   ESOPHAGOGASTRODUODENOSCOPY N/A 08/28/2019   Procedure: ESOPHAGOGASTRODUODENOSCOPY (EGD);  Surgeon: Toledo, Benay Pike, MD;  Location: [facility] ENDOSCOPY;  Service: Gastroenterology;  Laterality: N/A;   ESOPHAGOGASTRODUODENOSCOPY (EGD) WITH PROPOFOL N/A 05/03/2015   Procedure: ESOPHAGOGASTRODUODENOSCOPY (EGD) WITH PROPOFOL;  Surgeon: [name], MD;  Location: [facility] ENDOSCOPY;  Service: Gastroenterology;  Laterality: N/A;   HERNIA REPAIR     1975    Social History   Socioeconomic History   Marital status: Divorced    Spouse name: Not on file   Number of children: 4   Years of education: Not on file   Highest education level: Not on file  Occupational History   Occupation: retired    Comment: [employer]  Tobacco Use   Smoking status: Never   Smokeless tobacco: Never  Vaping Use   Vaping Use: Never used  Substance and Sexual Activity   Alcohol use: No   Drug use: No   Sexual activity: Not Currently  Other Topics Concern   Not on file  Social History Narrative   Not on file   Social Determinants of Health   Financial Resource Strain: Low Risk    Difficulty of Paying Living Expenses: Not very  hard  Food Insecurity: Not on file  Transportation Needs: Not on file  Physical Activity: Not on file  Stress: Not on file  Social Connections: Not on file  Intimate Partner Violence: Not on file    Family History  Problem Relation Age of Onset   Alzheimer's disease Father    Lung cancer Sister    Cancer Sister      Current Outpatient Medications:    allopurinol (ZYLOPRIM) 300 MG tablet, Take 1 tablet (300 mg total) by mouth daily., Disp: 90 tablet, Rfl: 2   ferrous sulfate 325 (65 FE) MG tablet, Take 325 mg by mouth daily., Disp: , Rfl:    furosemide (LASIX) 20 MG tablet, Take one tab po qd for swelling, Disp: 30 tablet, Rfl: 3   hydrochlorothiazide (MICROZIDE) 12.5 MG capsule, TAKE 1 CAPSULE BY MOUTH EVERY DAY, Disp: 90 capsule, Rfl: 3   Melatonin 10 MG CAPS, Take by mouth daily as needed., Disp: , Rfl:    pantoprazole (PROTONIX) 40 MG tablet, TAKE 1 TABLET BY MOUTH EVERY DAY, Disp: 90 tablet, Rfl: 1   potassium chloride SA (KLOR-CON M20) 20 MEQ tablet, Take 1 tablet (20 mEq total) by mouth 2 (two) times daily., Disp: 180 tablet, Rfl: 1   prochlorperazine (COMPAZINE) 10  MG tablet, Take 10 mg by mouth every 6 (six) hours as needed., Disp: , Rfl:    tranexamic acid (LYSTEDA) 650 MG TABS tablet, Take 1,300 mg by mouth 3 (three) times daily., Disp: , Rfl:    traZODone (DESYREL) 50 MG tablet, TAKE 1 TABLET BY MOUTH EVERYDAY AT BEDTIME, Disp: 90 tablet, Rfl: 1   valACYclovir (VALTREX) 500 MG tablet, Take 1 tablet (500 mg total) by mouth daily., Disp: 90 tablet, Rfl: 1   venetoclax (VENCLEXTA) 100 MG tablet, Take 400 mg by mouth daily. Take for 21 days, then hold for 7 days. Repeat every 28 days., Disp: , Rfl:   Physical exam:  Vitals:   05/13/21 1301  BP: (!) 117/56  Pulse: 100  Resp: 20  Temp: (!) 97.2 F (36.2 C)  SpO2: 100%  Weight: 292 lb 8 oz (132.7 kg)   Physical Exam Constitutional:      General: He is not in acute distress. Cardiovascular:     Rate and Rhythm: Normal  rate and regular rhythm.     Heart sounds: Normal heart sounds.  Pulmonary:     Effort: Pulmonary effort is normal.     Breath sounds: Normal breath sounds.  Abdominal:     General: Bowel sounds are normal.     Palpations: Abdomen is soft.  Skin:    General: Skin is warm and dry.  Neurological:     Mental Status: He is alert and oriented to person, place, and time.     CMP Latest Ref Rng & Units 05/10/2021  Glucose 70 - 99 mg/dL 105(H)  BUN 8 - 23 mg/dL 8  Creatinine 0.61 - 1.24 mg/dL 0.71  Sodium 135 - 145 mmol/L 137  Potassium 3.5 - 5.1 mmol/L 3.7  Chloride 98 - 111 mmol/L 102  CO2 22 - 32 mmol/L 29  Calcium 8.9 - 10.3 mg/dL 8.6(L)  Total Protein 6.5 - 8.1 g/dL 6.6  Total Bilirubin 0.3 - 1.2 mg/dL 0.2(L)  Alkaline Phos 38 - 126 U/L 79  AST 15 - 41 U/L 14(L)  ALT 0 - 44 U/L 11   CBC Latest Ref Rng & Units 05/10/2021  WBC 4.0 - 10.5 K/uL 3.0(L)  Hemoglobin 13.0 - 17.0 g/dL 8.3(L)  Hematocrit 39.0 - 52.0 % 27.2(L)  Platelets 150 - 400 K/uL 142(L)    No images are attached to the encounter.  No results found.   Assessment and plan- Patient is a 81 y.o. 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  His ANC 3 days ago was greater than 1 and therefore he will proceed with cycle 2 of Vidaza day 2 today and received 3 days of treatment next week.  I will see him on 06/13/2021 for cycle 3 of Vidaza.  Patient understands that he needs to take venetoclax 400 mg daily 3 weeks on and 1 week off.  He will also need to stay on Valtrex prophylaxis.  Anemia: Likely secondary to chemotherapy plus venetoclax and pre-existing iron deficiency.  We will check ferritin and iron studies with next visit.  We will decide about when patient can get cataract surgery based on his counts next cycle.   Visit Diagnosis 1. AML (acute myeloid leukemia) in remission ([place])   2.  Encounter for antineoplastic chemotherapy      Dr. Randa Evens, MD, MPH [place] [facility]  [phone] 05/13/2021 3:56 PM

## 2021-05-13 NOTE — Assessment & Plan Note (Signed)

## 2021-05-13 NOTE — Progress Notes (Signed)
Pt will like to know if after next Wednesday will he be able to have cataract surgery?  ?

## 2021-05-13 NOTE — Progress Notes (Signed)
Patient ID: Samuel Frank., male   DOB: [birth year], 81 y.o.   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 is a 81 y.o. male.  I am asked to see the patient by Dr. Vickki Muff for evaluation of leg swelling.  The patient had blistering and some areas of wounds previously on both lower extremities.  This has been going on for many months.  He currently does not have any open wounds or weeping, but the legs remain quite swollen.  He has no strength in his right hand and cannot get regular compression socks on.  He does try to elevate his legs some.  He is fairly sedentary.  No fevers or chills.  He has been previously checked for DVT and has not had a DVT and has no previous history of DVT to his knowledge.  He does have anemia and leukemia.     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[place])    Dyspnea    Hypertension     Past Surgical History:  Procedure Laterality Date   COLONOSCOPY N/A 08/28/2019   Procedure: COLONOSCOPY;  Surgeon: Toledo, Benay Pike, MD;  Location: [facility] ENDOSCOPY;  Service: Gastroenterology;  Laterality: N/A;   COLONOSCOPY WITH PROPOFOL N/A 05/03/2015   Procedure: COLONOSCOPY WITH PROPOFOL;  Surgeon: [name], MD;  Location: [facility] ENDOSCOPY;  Service: Gastroenterology;  Laterality: N/A;   ESOPHAGOGASTRODUODENOSCOPY N/A 08/28/2019   Procedure: ESOPHAGOGASTRODUODENOSCOPY (EGD);  Surgeon: Toledo, Benay Pike, MD;  Location: [facility] ENDOSCOPY;  Service: Gastroenterology;  Laterality: N/A;   ESOPHAGOGASTRODUODENOSCOPY (EGD) WITH PROPOFOL N/A 05/03/2015   Procedure: ESOPHAGOGASTRODUODENOSCOPY (EGD) WITH PROPOFOL;  Surgeon: [name], MD;  Location: [facility] ENDOSCOPY;  Service: Gastroenterology;  Laterality: N/A;   HERNIA REPAIR     1975     Family History   Problem Relation Age of Onset   Alzheimer's disease Father    Lung cancer Sister    Cancer Sister   No bleeding or clotting disorders   Social History   Tobacco Use   Smoking status: Never   Smokeless tobacco: Never  Vaping Use   Vaping Use: Never used  Substance Use Topics   Alcohol use: No   Drug use: No     No Known Allergies  Current Outpatient Medications  Medication Sig Dispense Refill   allopurinol (ZYLOPRIM) 300 MG tablet Take 1 tablet (300 mg total) by mouth daily. 90 tablet 2   ferrous sulfate 325 (65 FE) MG tablet Take 325 mg by mouth daily.     furosemide (LASIX) 20 MG tablet Take one tab po qd for swelling 30 tablet 3   hydrochlorothiazide (MICROZIDE) 12.5 MG capsule TAKE 1 CAPSULE BY MOUTH EVERY DAY 90 capsule 3   Melatonin 10 MG CAPS Take by mouth daily as needed.     pantoprazole (PROTONIX) 40 MG tablet TAKE 1 TABLET BY MOUTH EVERY DAY 90 tablet 1   potassium chloride SA (KLOR-CON M20) 20 MEQ tablet Take 1 tablet (20 mEq total) by mouth 2 (two) times daily. 180 tablet 1   prochlorperazine (COMPAZINE) 10 MG tablet Take 10 mg by mouth every 6 (six) hours as needed.     tranexamic acid (LYSTEDA) 650 MG TABS tablet Take 1,300 mg  by mouth 3 (three) times daily.     traZODone (DESYREL) 50 MG tablet TAKE 1 TABLET BY MOUTH EVERYDAY AT BEDTIME 90 tablet 1   valACYclovir (VALTREX) 500 MG tablet Take 1 tablet (500 mg total) by mouth daily. 90 tablet 1   venetoclax (VENCLEXTA) 100 MG tablet Take 400 mg by mouth daily. Take for 21 days, then hold for 7 days. Repeat every 28 days.     No current facility-administered medications for this visit.      REVIEW OF SYSTEMS (Negative unless checked)  Constitutional: '[]'$ Weight loss  '[]'$ Fever  '[]'$ Chills Cardiac: '[]'$ Chest pain   '[]'$ Chest pressure   '[x]'$ Palpitations   '[]'$ Shortness of breath when laying flat   '[]'$ Shortness of breath at rest   '[x]'$ Shortness of breath with exertion. Vascular:  '[x]'$ Pain in legs with walking   '[x]'$ Pain in legs  at rest   '[]'$ Pain in legs when laying flat   '[]'$ Claudication   '[]'$ Pain in feet when walking  '[]'$ Pain in feet at rest  '[]'$ Pain in feet when laying flat   '[]'$ History of DVT   '[]'$ Phlebitis   '[x]'$ Swelling in legs   '[]'$ Varicose veins   '[]'$ Non-healing ulcers Pulmonary:   '[]'$ Uses home oxygen   '[]'$ Productive cough   '[]'$ Hemoptysis   '[]'$ Wheeze  '[]'$ COPD   '[]'$ Asthma Neurologic:  '[]'$ Dizziness  '[]'$ Blackouts   '[]'$ Seizures   '[]'$ History of stroke   '[]'$ History of TIA  '[]'$ Aphasia   '[]'$ Temporary blindness   '[]'$ Dysphagia   '[]'$ Weakness or numbness in arms   '[]'$ Weakness or numbness in legs Musculoskeletal:  '[x]'$ Arthritis   '[]'$ Joint swelling   '[x]'$ Joint pain   '[]'$ Low back pain Hematologic:  '[]'$ Easy bruising  '[]'$ Easy bleeding   '[]'$ Hypercoagulable state   '[]'$ Anemic  '[]'$ Hepatitis Gastrointestinal:  '[]'$ Blood in stool   '[]'$ Vomiting blood  '[]'$ Gastroesophageal reflux/heartburn   '[]'$ Abdominal pain Genitourinary:  '[]'$ Chronic kidney disease   '[]'$ Difficult urination  '[]'$ Frequent urination  '[]'$ Burning with urination   '[]'$ Hematuria Skin:  '[]'$ Rashes   '[]'$ Ulcers   '[]'$ Wounds Psychological:  '[]'$ History of anxiety   '[]'$  History of major depression.    Physical Exam BP 117/76 (BP Location: Left Arm)    Pulse 90    Resp 16    Ht 6' 2.5" (1.892 m)    Wt 298 lb 6.4 oz (135.4 kg)    BMI 37.80 kg/m  Gen:  WD/WN, NAD. Obese. Head: [place]/AT, No temporalis wasting.  Ear/Nose/Throat: Hearing grossly intact, nares w/o erythema or drainage, oropharynx w/o Erythema/Exudate Eyes: Conjunctiva clear, sclera non-icteric  Neck: trachea midline.  No JVD.  Pulmonary:  Good air movement, respirations not labored, no use of accessory muscles  Cardiac: RRR, no JVD Vascular:  Vessel Right Left  Radial Palpable Palpable                          DP 1+ 1+  PT NP NP   Gastrointestinal:. No masses, surgical incisions, or scars. 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 2-3+ BLE edema. Neurologic: Sensation grossly intact in extremities.  Symmetrical.  Speech is fluent. Motor exam  as listed above. Psychiatric: Judgment intact, Mood & affect appropriate for pt's clinical situation. Dermatologic: No rashes or ulcers noted.  No cellulitis or open wounds.    Radiology No results found.  Labs Recent Results (from the past 2160 hour(s))  Comprehensive metabolic panel     Status: Abnormal   Collection Time: 03/21/21  8:25 AM  Result Value Ref Range   Sodium 132 (L) 135 - 145 mmol/L  Potassium 3.1 (L) 3.5 - 5.1 mmol/L   Chloride 100 98 - 111 mmol/L   CO2 26 22 - 32 mmol/L   Glucose, Bld 118 (H) 70 - 99 mg/dL    Comment: Glucose reference range applies only to samples taken after fasting for at least 8 hours.   BUN <5 (L) 8 - 23 mg/dL   Creatinine, Ser 0.59 (L) 0.61 - 1.24 mg/dL   Calcium 8.0 (L) 8.9 - 10.3 mg/dL   Total Protein 6.6 6.5 - 8.1 g/dL   Albumin 3.1 (L) 3.5 - 5.0 g/dL   AST 13 (L) 15 - 41 U/L   ALT 8 0 - 44 U/L   Alkaline Phosphatase 98 38 - 126 U/L   Total Bilirubin 0.4 0.3 - 1.2 mg/dL   GFR, Estimated >60 >60 mL/min    Comment: (NOTE) Calculated using the CKD-EPI Creatinine Equation (2021)    Anion gap 6 5 - 15    Comment: Performed at [facility], [address] [place], [place] [address]  Hold Tube- Blood Bank     Status: None   Collection Time: 03/21/21  8:28 AM  Result Value Ref Range   Blood Bank Specimen SAMPLE AVAILABLE FOR TESTING    Sample Expiration      03/24/2021,2359 Performed at [facility], [address] [place], [place] [address]   CBC with Differential/Platelet     Status: Abnormal   Collection Time: 03/21/21  8:28 AM  Result Value Ref Range   WBC 5.6 4.0 - 10.5 K/uL   RBC 3.27 (L) 4.22 - 5.81 MIL/uL   Hemoglobin 8.9 (L) 13.0 - 17.0 g/dL   HCT 28.0 (L) 39.0 - 52.0 %   MCV 85.6 80.0 - 100.0 fL   MCH 27.2 26.0 - 34.0 pg   MCHC 31.8 30.0 - 36.0 g/dL   RDW 20.3 (H) 11.5 - 15.5 %   Platelets 482 (H) 150 - 400 K/uL   nRBC 0.4 (H) 0.0 - 0.2 %   Neutrophils Relative % 46 %   Neutro Abs 2.6 1.7 - 7.7  K/uL   Lymphocytes Relative 29 %   Lymphs Abs 1.6 0.7 - 4.0 K/uL   Monocytes Relative 12 %   Monocytes Absolute 0.7 0.1 - 1.0 K/uL   Eosinophils Relative 2 %   Eosinophils Absolute 0.1 0.0 - 0.5 K/uL   Basophils Relative 3 %   Basophils Absolute 0.2 (H) 0.0 - 0.1 K/uL   WBC Morphology MILD LEFT SHIFT (1-5% METAS, OCC MYELO, OCC BANDS)    RBC Morphology MIXED RBC POPULATION    Smear Review PLATELETS APPEAR ADEQUATE    Immature Granulocytes 8 %   Abs Immature Granulocytes 0.45 (H) 0.00 - 0.07 K/uL    Comment: Performed at [facility], [address] [place], [place] [address]  Comprehensive metabolic panel     Status: Abnormal   Collection Time: 03/28/21  8:16 AM  Result Value Ref Range   Sodium 136 135 - 145 mmol/L   Potassium 3.2 (L) 3.5 - 5.1 mmol/L   Chloride 98 98 - 111 mmol/L   CO2 30 22 - 32 mmol/L   Glucose, Bld 108 (H) 70 - 99 mg/dL    Comment: Glucose reference range applies only to samples taken after fasting for at least 8 hours.   BUN 8 8 - 23 mg/dL   Creatinine, Ser 0.67 0.61 - 1.24 mg/dL   Calcium 8.6 (L) 8.9 - 10.3 mg/dL   Total Protein 6.6  6.5 - 8.1 g/dL   Albumin 3.3 (L) 3.5 - 5.0 g/dL   AST 13 (L) 15 - 41 U/L   ALT 7 0 - 44 U/L   Alkaline Phosphatase 85 38 - 126 U/L   Total Bilirubin 0.6 0.3 - 1.2 mg/dL   GFR, Estimated >60 >60 mL/min    Comment: (NOTE) Calculated using the CKD-EPI Creatinine Equation (2021)    Anion gap 8 5 - 15    Comment: Performed at [facility], [address] [place], [place] [address]  Magnesium     Status: None   Collection Time: 03/28/21  8:16 AM  Result Value Ref Range   Magnesium 1.9 1.7 - 2.4 mg/dL    Comment: Performed at [facility], [address] [place], [place] [address]  Hold Tube- Blood Bank     Status: None   Collection Time: 03/28/21  8:19 AM  Result Value Ref Range   Blood Bank Specimen SAMPLE AVAILABLE FOR TESTING    Sample Expiration      03/31/2021,2359 Performed at [facility], [address] [place], [place] [address]   CBC with Differential/Platelet     Status: Abnormal   Collection Time: 03/28/21  8:19 AM  Result Value Ref Range   WBC 4.5 4.0 - 10.5 K/uL   RBC 3.04 (L) 4.22 - 5.81 MIL/uL   Hemoglobin 8.3 (L) 13.0 - 17.0 g/dL   HCT 26.8 (L) 39.0 - 52.0 %   MCV 88.2 80.0 - 100.0 fL   MCH 27.3 26.0 - 34.0 pg   MCHC 31.0 30.0 - 36.0 g/dL   RDW 21.7 (H) 11.5 - 15.5 %   Platelets 85 (L) 150 - 400 K/uL    Comment: SPECIMEN CHECKED FOR CLOTS   nRBC 0.4 (H) 0.0 - 0.2 %   Neutrophils Relative % 59 %   Neutro Abs 2.6 1.7 - 7.7 K/uL   Lymphocytes Relative 21 %   Lymphs Abs 1.0 0.7 - 4.0 K/uL   Monocytes Relative 19 %   Monocytes Absolute 0.9 0.1 - 1.0 K/uL   Eosinophils Relative 0 %   Eosinophils Absolute 0.0 0.0 - 0.5 K/uL   Basophils Relative 0 %   Basophils Absolute 0.0 0.0 - 0.1 K/uL   Immature Granulocytes 1 %   Abs Immature Granulocytes 0.05 0.00 - 0.07 K/uL    Comment: Performed at [facility], [address] [place], [place] [address]  Ferritin     Status: Abnormal   Collection Time: 04/04/21  8:56 AM  Result Value Ref Range   Ferritin 22 (L) 24 - 336 ng/mL    Comment: Performed at [facility], [address] [place], [place] [address]  Comprehensive metabolic panel     Status: Abnormal   Collection Time: 04/04/21  8:56 AM  Result Value Ref Range   Sodium 135 135 - 145 mmol/L   Potassium 3.5 3.5 - 5.1 mmol/L   Chloride 101 98 - 111 mmol/L   CO2 27 22 - 32 mmol/L   Glucose, Bld 102 (H) 70 - 99 mg/dL    Comment: Glucose reference range applies only to samples taken after fasting for at least 8 hours.   BUN 6 (L) 8 - 23 mg/dL   Creatinine, Ser 0.76 0.61 - 1.24 mg/dL   Calcium 8.7 (L) 8.9 - 10.3 mg/dL   Total Protein 6.9 6.5 - 8.1 g/dL   Albumin 3.4 (L) 3.5 - 5.0 g/dL   AST 11 (L)  15 - 41 U/L   ALT 7 0 - 44 U/L   Alkaline Phosphatase 85 38 - 126 U/L   Total Bilirubin 0.9 0.3 - 1.2 mg/dL   GFR, Estimated >60  >60 mL/min    Comment: (NOTE) Calculated using the CKD-EPI Creatinine Equation (2021)    Anion gap 7 5 - 15    Comment: Performed at [facility], [address] [place], [place] [address]  Hold Tube- Blood Bank     Status: None   Collection Time: 04/04/21  8:59 AM  Result Value Ref Range   Blood Bank Specimen SAMPLE AVAILABLE FOR TESTING    Sample Expiration      04/07/2021,2359 Performed at [facility], [address] [place], [place] [address]   Iron and TIBC([facility])     Status: None   Collection Time: 04/04/21  8:59 AM  Result Value Ref Range   Iron 105 45 - 182 ug/dL   TIBC 302 250 - 450 ug/dL   Saturation Ratios 35 17.9 - 39.5 %   UIBC 197 ug/dL    Comment: Performed at [facility], [address] [place], [place] [address]  CBC with Differential/Platelet     Status: Abnormal   Collection Time: 04/04/21  8:59 AM  Result Value Ref Range   WBC 4.0 4.0 - 10.5 K/uL   RBC 3.08 (L) 4.22 - 5.81 MIL/uL   Hemoglobin 8.4 (L) 13.0 - 17.0 g/dL   HCT 27.9 (L) 39.0 - 52.0 %   MCV 90.6 80.0 - 100.0 fL   MCH 27.3 26.0 - 34.0 pg   MCHC 30.1 30.0 - 36.0 g/dL   RDW 25.2 (H) 11.5 - 15.5 %   Platelets 207 150 - 400 K/uL   nRBC 0.5 (H) 0.0 - 0.2 %   Neutrophils Relative % 61 %   Neutro Abs 2.4 1.7 - 7.7 K/uL   Lymphocytes Relative 20 %   Lymphs Abs 0.8 0.7 - 4.0 K/uL   Monocytes Relative 17 %   Monocytes Absolute 0.7 0.1 - 1.0 K/uL   Eosinophils Relative 0 %   Eosinophils Absolute 0.0 0.0 - 0.5 K/uL   Basophils Relative 1 %   Basophils Absolute 0.0 0.0 - 0.1 K/uL   Immature Granulocytes 1 %   Abs Immature Granulocytes 0.03 0.00 - 0.07 K/uL    Comment: Performed at [facility], [address] [place], [place] [address]  Hold Tube- Blood Bank     Status: None   Collection Time: 04/18/21  9:06 AM  Result Value Ref Range   Blood Bank Specimen SAMPLE AVAILABLE FOR TESTING    Sample Expiration      04/21/2021,2359 Performed at  [facility], [address] [place], [place] [address]   Comprehensive metabolic panel     Status: Abnormal   Collection Time: 04/18/21  9:06 AM  Result Value Ref Range   Sodium 134 (L) 135 - 145 mmol/L   Potassium 3.6 3.5 - 5.1 mmol/L   Chloride 99 98 - 111 mmol/L   CO2 27 22 - 32 mmol/L   Glucose, Bld 125 (H) 70 - 99 mg/dL    Comment: Glucose reference range applies only to samples taken after fasting for at least 8 hours.   BUN 10 8 - 23 mg/dL   Creatinine, Ser 0.69 0.61 - 1.24 mg/dL   Calcium 8.4 (L) 8.9 - 10.3 mg/dL   Total Protein 7.0 6.5 - 8.1 g/dL   Albumin 3.3 (L)  3.5 - 5.0 g/dL   AST 10 (L) 15 - 41 U/L   ALT 7 0 - 44 U/L   Alkaline Phosphatase 77 38 - 126 U/L   Total Bilirubin 0.4 0.3 - 1.2 mg/dL   GFR, Estimated >60 >60 mL/min    Comment: (NOTE) Calculated using the CKD-EPI Creatinine Equation (2021)    Anion gap 8 5 - 15    Comment: Performed at [facility], [address] [place], [place] [address]  CBC with Differential/Platelet     Status: Abnormal   Collection Time: 04/18/21  9:06 AM  Result Value Ref Range   WBC 0.9 (LL) 4.0 - 10.5 K/uL    Comment: REPEATED TO VERIFY THIS CRITICAL RESULT HAS VERIFIED AND BEEN CALLED TO SHERRY VENABLE BY KIM ROOS ON 02 13 2023 AT 0950, AND HAS BEEN READ BACK.     RBC 3.33 (L) 4.22 - 5.81 MIL/uL   Hemoglobin 9.0 (L) 13.0 - 17.0 g/dL   HCT 30.3 (L) 39.0 - 52.0 %   MCV 91.0 80.0 - 100.0 fL   MCH 27.0 26.0 - 34.0 pg   MCHC 29.7 (L) 30.0 - 36.0 g/dL   RDW 23.7 (H) 11.5 - 15.5 %   Platelets 236 150 - 400 K/uL   nRBC 0.0 0.0 - 0.2 %   Neutrophils Relative % 43 %   Neutro Abs 0.4 (LL) 1.7 - 7.7 K/uL    Comment: REPEATED TO VERIFY THIS CRITICAL RESULT HAS VERIFIED AND BEEN CALLED TO SHERRY VENABLE BY KIM ROOS ON 02 13 2023 AT 0951, AND HAS BEEN READ BACK.     Lymphocytes Relative 45 %   Lymphs Abs 0.4 (L) 0.7 - 4.0 K/uL   Monocytes Relative 7 %   Monocytes Absolute 0.1 0.1 - 1.0 K/uL   Eosinophils  Relative 2 %   Eosinophils Absolute 0.0 0.0 - 0.5 K/uL   Basophils Relative 3 %   Basophils Absolute 0.0 0.0 - 0.1 K/uL   WBC Morphology TO FEW TO COUNT.  SMEAR AVAILABLE FOR REVIEW.    Immature Granulocytes 0 %   Abs Immature Granulocytes 0.00 0.00 - 0.07 K/uL    Comment: Performed at [facility], [address] [place], [place] [address]  Comprehensive metabolic panel     Status: Abnormal   Collection Time: 04/25/21 12:44 PM  Result Value Ref Range   Sodium 132 (L) 135 - 145 mmol/L   Potassium 3.4 (L) 3.5 - 5.1 mmol/L   Chloride 103 98 - 111 mmol/L   CO2 23 22 - 32 mmol/L   Glucose, Bld 117 (H) 70 - 99 mg/dL    Comment: Glucose reference range applies only to samples taken after fasting for at least 8 hours.   BUN 8 8 - 23 mg/dL   Creatinine, Ser 0.76 0.61 - 1.24 mg/dL   Calcium 8.2 (L) 8.9 - 10.3 mg/dL   Total Protein 7.3 6.5 - 8.1 g/dL   Albumin 3.1 (L) 3.5 - 5.0 g/dL   AST 24 15 - 41 U/L   ALT 18 0 - 44 U/L   Alkaline Phosphatase 67 38 - 126 U/L   Total Bilirubin 0.4 0.3 - 1.2 mg/dL   GFR, Estimated >60 >60 mL/min    Comment: (NOTE) Calculated using the CKD-EPI Creatinine Equation (2021)    Anion gap 6 5 - 15    Comment: Performed at [facility], [address], [place], [place] [address]  Hold Tube- Blood Bank  Status: None   Collection Time: 04/25/21 12:47 PM  Result Value Ref Range   Blood Bank Specimen SAMPLE AVAILABLE FOR TESTING    Sample Expiration      04/28/2021,2359 Performed at [facility], [address] [place], [place] [address]   CBC with Differential/Platelet     Status: Abnormal   Collection Time: 04/25/21 12:47 PM  Result Value Ref Range   WBC 0.9 (LL) 4.0 - 10.5 K/uL    Comment: This critical result has verified and been called to DR.RAO by Finis Bud on 02 20 2023 at 1313, and has been read back.    RBC 3.20 (L) 4.22 - 5.81 MIL/uL   Hemoglobin 8.7 (L) 13.0 - 17.0 g/dL   HCT 28.2 (L) 39.0 - 52.0 %   MCV 88.1 80.0  - 100.0 fL   MCH 27.2 26.0 - 34.0 pg   MCHC 30.9 30.0 - 36.0 g/dL   RDW 23.1 (H) 11.5 - 15.5 %   Platelets 64 (L) 150 - 400 K/uL   nRBC 0.0 0.0 - 0.2 %   Neutrophils Relative % 39 %   Neutro Abs 0.4 (LL) 1.7 - 7.7 K/uL    Comment: This critical result has verified and been called to DR.RAO by Finis Bud on 02 20 2023 at 1313, and has been read back.    Lymphocytes Relative 48 %   Lymphs Abs 0.4 (L) 0.7 - 4.0 K/uL   Monocytes Relative 10 %   Monocytes Absolute 0.1 0.1 - 1.0 K/uL   Eosinophils Relative 1 %   Eosinophils Absolute 0.0 0.0 - 0.5 K/uL   Basophils Relative 1 %   Basophils Absolute 0.0 0.0 - 0.1 K/uL   WBC Morphology TOO COUNT TOO COUNT    RBC Morphology MORPHOLOGY UNREMARKABLE    Smear Review Normal platelet morphology     Comment: PLATELETS APPEAR DECREASED   Immature Granulocytes 1 %   Abs Immature Granulocytes 0.01 0.00 - 0.07 K/uL    Comment: Performed at [facility], [address] [place], [place] [address]  Comprehensive metabolic panel     Status: Abnormal   Collection Time: 05/10/21  8:32 AM  Result Value Ref Range   Sodium 137 135 - 145 mmol/L   Potassium 3.7 3.5 - 5.1 mmol/L   Chloride 102 98 - 111 mmol/L   CO2 29 22 - 32 mmol/L   Glucose, Bld 105 (H) 70 - 99 mg/dL    Comment: Glucose reference range applies only to samples taken after fasting for at least 8 hours.   BUN 8 8 - 23 mg/dL   Creatinine, Ser 0.71 0.61 - 1.24 mg/dL   Calcium 8.6 (L) 8.9 - 10.3 mg/dL   Total Protein 6.6 6.5 - 8.1 g/dL   Albumin 3.3 (L) 3.5 - 5.0 g/dL   AST 14 (L) 15 - 41 U/L   ALT 11 0 - 44 U/L   Alkaline Phosphatase 79 38 - 126 U/L   Total Bilirubin 0.2 (L) 0.3 - 1.2 mg/dL   GFR, Estimated >60 >60 mL/min    Comment: (NOTE) Calculated using the CKD-EPI Creatinine Equation (2021)    Anion gap 6 5 - 15    Comment: Performed at [facility], [address] [place],  [address]  CBC with Differential     Status: Abnormal   Collection Time: 05/10/21   8:32 AM  Result Value Ref Range   WBC 3.0 (L) 4.0 - 10.5 K/uL   RBC 2.87 (  L) 4.22 - 5.81 MIL/uL   Hemoglobin 8.3 (L) 13.0 - 17.0 g/dL   HCT 27.2 (L) 39.0 - 52.0 %   MCV 94.8 80.0 - 100.0 fL   MCH 28.9 26.0 - 34.0 pg   MCHC 30.5 30.0 - 36.0 g/dL   RDW 25.2 (H) 11.5 - 15.5 %   Platelets 142 (L) 150 - 400 K/uL   nRBC 0.7 (H) 0.0 - 0.2 %   Neutrophils Relative % 41 %   Neutro Abs 1.2 (L) 1.7 - 7.7 K/uL   Lymphocytes Relative 33 %   Lymphs Abs 1.0 0.7 - 4.0 K/uL   Monocytes Relative 23 %   Monocytes Absolute 0.7 0.1 - 1.0 K/uL   Eosinophils Relative 0 %   Eosinophils Absolute 0.0 0.0 - 0.5 K/uL   Basophils Relative 0 %   Basophils Absolute 0.0 0.0 - 0.1 K/uL   Immature Granulocytes 3 %   Abs Immature Granulocytes 0.09 (H) 0.00 - 0.07 K/uL    Comment: Performed at [facility], [address] [place], [place] [address]  Hold Tube- Blood Bank     Status: None   Collection Time: 05/10/21  8:32 AM  Result Value Ref Range   Blood Bank Specimen SAMPLE AVAILABLE FOR TESTING    Sample Expiration      05/13/2021,2359 Performed at [facility], [address] [place], [place] [address]     Assessment/Plan:  Swelling of limb I have had a long discussion with the patient regarding swelling and why it  causes symptoms.  Patient will begin wearing graduated compression stockings class 1 (20-30 mmHg) on a daily basis a prescription was given. The patient will  beginning wearing the stockings first thing in the morning and removing them in the evening. The patient is instructed specifically not to sleep in the stockings.   In addition, behavioral modification will be initiated.  This will include frequent elevation, use of over the counter pain medications and exercise such as walking.  I have reviewed systemic causes for chronic edema such as liver, kidney and cardiac etiologies.  The patient denies problems with these organ systems.    Consideration for a lymph pump will  also be made based upon the effectiveness of conservative therapy.  This would help to improve the edema control and prevent sequela such as ulcers and infections   Patient should undergo duplex ultrasound of the venous system to ensure that DVT or reflux is not present.  The patient will follow-up with me after the ultrasound.    Pre-diabetes blood glucose control important in reducing the progression of atherosclerotic disease. Also, involved in wound healing. On appropriate medications.   Essential hypertension, benign blood pressure control important in reducing the progression of atherosclerotic disease. On appropriate oral medications.   [place] Right hand is somewhat weathered which limits his ability to get on and off regular compression socks      Leotis Pain 05/13/2021, 2:13 PM   This note was created with Dragon medical transcription system.  Any errors from dictation are unintentional.

## 2021-05-13 NOTE — Assessment & Plan Note (Signed)
Right hand is somewhat weathered which limits his ability to get on and off regular compression socks ?

## 2021-05-13 NOTE — Patient Instructions (Signed)
Edema °Edema is an abnormal buildup of fluids in the body tissues and under the skin. Swelling of the legs, feet, and ankles is a common symptom that becomes more likely as you get older. Swelling is also common in looser tissues, like around the eyes. When the affected area is squeezed, the fluid may move out of that spot and leave a dent for a few moments. This dent is called pitting edema. °There are many possible causes of edema. Eating too much salt (sodium) and being on your feet or sitting for a long time can cause edema in your legs, feet, and ankles. Hot weather may make edema worse. Common causes of edema include: °Heart failure. °Liver or kidney disease. °Weak leg blood vessels. °Cancer. °An injury. °Pregnancy. °Medicines. °Being obese. °Low protein levels in the blood. °Edema is usually painless. Your skin may look swollen or shiny. °Follow these instructions at home: °Keep the affected body part raised (elevated) above the level of your heart when you are sitting or lying down. °Do not sit still or stand for long periods of time. °Do not wear tight clothing. Do not wear garters on your upper legs. °Exercise your legs to get your circulation going. This helps to move the fluid back into your blood vessels, and it may help the swelling go down. °Wear elastic bandages or support stockings to reduce swelling as told by your health care provider. °Eat a low-salt (low-sodium) diet to reduce fluid as told by your health care provider. °Depending on the cause of your swelling, you may need to limit how much fluid you drink (fluid restriction). °Take over-the-counter and prescription medicines only as told by your health care provider. °Contact a health care provider if: °Your edema does not get better with treatment. °You have heart, liver, or kidney disease and have symptoms of edema. °You have sudden and unexplained weight gain. °Get help right away if: °You develop shortness of breath or chest pain. °You  cannot breathe when you lie down. °You develop pain, redness, or warmth in the swollen areas. °You have heart, liver, or kidney disease and suddenly get edema. °You have a fever and your symptoms suddenly get worse. °Summary °Edema is an abnormal buildup of fluids in the body tissues and under the skin. °Eating too much salt (sodium) and being on your feet or sitting for a long time can cause edema in your legs, feet, and ankles. °Keep the affected body part raised (elevated) above the level of your heart when you are sitting or lying down. °This information is not intended to replace advice given to you by your health care provider. Make sure you discuss any questions you have with your health care provider. °Document Revised: 07/22/2020 Document Reviewed: 12/16/2019 °Elsevier Patient Education © 2022 Elsevier Inc. ° °

## 2021-05-16 ENCOUNTER — Other Ambulatory Visit: Payer: Self-pay

## 2021-05-16 ENCOUNTER — Other Ambulatory Visit: Payer: Self-pay | Admitting: *Deleted

## 2021-05-16 ENCOUNTER — Inpatient Hospital Stay: Payer: Medicare Other

## 2021-05-16 VITALS — BP 108/66 | HR 99 | Temp 98.0°F | Resp 18 | Wt 296.7 lb

## 2021-05-16 DIAGNOSIS — Z79899 Other long term (current) drug therapy: Secondary | ICD-10-CM | POA: Diagnosis not present

## 2021-05-16 DIAGNOSIS — D509 Iron deficiency anemia, unspecified: Secondary | ICD-10-CM

## 2021-05-16 DIAGNOSIS — H269 Unspecified cataract: Secondary | ICD-10-CM | POA: Diagnosis not present

## 2021-05-16 DIAGNOSIS — C9310 Chronic myelomonocytic leukemia not having achieved remission: Secondary | ICD-10-CM

## 2021-05-16 DIAGNOSIS — C9201 Acute myeloblastic leukemia, in remission: Secondary | ICD-10-CM | POA: Diagnosis not present

## 2021-05-16 LAB — COMPREHENSIVE METABOLIC PANEL
ALT: 9 U/L (ref 0–44)
AST: 13 U/L — ABNORMAL LOW (ref 15–41)
Albumin: 3.3 g/dL — ABNORMAL LOW (ref 3.5–5.0)
Alkaline Phosphatase: 88 U/L (ref 38–126)
Anion gap: 6 (ref 5–15)
BUN: 6 mg/dL — ABNORMAL LOW (ref 8–23)
CO2: 28 mmol/L (ref 22–32)
Calcium: 8.5 mg/dL — ABNORMAL LOW (ref 8.9–10.3)
Chloride: 100 mmol/L (ref 98–111)
Creatinine, Ser: 0.73 mg/dL (ref 0.61–1.24)
GFR, Estimated: >60 mL/min (ref >60)
Glucose, Bld: 104 mg/dL — ABNORMAL HIGH (ref 70–99)
Potassium: 3.6 mmol/L (ref 3.5–5.1)
Sodium: 134 mmol/L — ABNORMAL LOW (ref 135–145)
Total Bilirubin: 0.8 mg/dL (ref 0.3–1.2)
Total Protein: 7.1 g/dL (ref 6.5–8.1)

## 2021-05-16 LAB — CBC WITH DIFFERENTIAL/PLATELET
Abs Immature Granulocytes: 0.1 10*3/uL — ABNORMAL HIGH (ref 0.00–0.07)
Basophils Absolute: 0 10*3/uL (ref 0.0–0.1)
Basophils Relative: 0 %
Eosinophils Absolute: 0 10*3/uL (ref 0.0–0.5)
Eosinophils Relative: 0 %
HCT: 28.9 % — ABNORMAL LOW (ref 39.0–52.0)
Hemoglobin: 8.6 g/dL — ABNORMAL LOW (ref 13.0–17.0)
Immature Granulocytes: 2 %
Lymphocytes Relative: 13 %
Lymphs Abs: 0.9 10*3/uL (ref 0.7–4.0)
MCH: 28.6 pg (ref 26.0–34.0)
MCHC: 29.8 g/dL — ABNORMAL LOW (ref 30.0–36.0)
MCV: 96 fL (ref 80.0–100.0)
Monocytes Absolute: 2.2 10*3/uL — ABNORMAL HIGH (ref 0.1–1.0)
Monocytes Relative: 33 %
Neutro Abs: 3.6 10*3/uL (ref 1.7–7.7)
Neutrophils Relative %: 52 %
Platelets: 136 10*3/uL — ABNORMAL LOW (ref 150–400)
RBC: 3.01 MIL/uL — ABNORMAL LOW (ref 4.22–5.81)
RDW: 26 % — ABNORMAL HIGH (ref 11.5–15.5)
WBC: 6.8 10*3/uL (ref 4.0–10.5)
nRBC: 0.6 % — ABNORMAL HIGH (ref 0.0–0.2)

## 2021-05-16 MED ORDER — AZACITIDINE CHEMO SQ INJECTION
75.0000 mg/m2 | Freq: Once | INTRAMUSCULAR | Status: AC
  Administered 2021-05-16: 200 mg via SUBCUTANEOUS
  Filled 2021-05-16: qty 8

## 2021-05-16 MED ORDER — ONDANSETRON HCL 4 MG PO TABS
8.0000 mg | ORAL_TABLET | Freq: Once | ORAL | Status: AC
  Administered 2021-05-16: 8 mg via ORAL
  Filled 2021-05-16: qty 2

## 2021-05-16 NOTE — Patient Instructions (Signed)
[facility] CANCER CTR AT [place]  Discharge Instructions: ?Thank you for choosing [place] to provide your oncology and hematology care.  ?If you have a lab appointment with the [place], please go directly to the [place] and check in at the registration area. ? ?Wear comfortable clothing and clothing appropriate for easy access to any Portacath or PICC line.  ? ?We strive to give you quality time with your provider. You may need to reschedule your appointment if you arrive late (15 or more minutes).  Arriving late affects you and other patients whose appointments are after yours.  Also, if you miss three or more appointments without notifying the office, you may be dismissed from the clinic at the provider?s discretion.    ?  ?For prescription refill requests, have your pharmacy contact our office and allow 72 hours for refills to be completed.   ? ?Today you received the following chemotherapy and/or immunotherapy agents Vidaza    ?  ?To help prevent nausea and vomiting after your treatment, we encourage you to take your nausea medication as directed. ? ?BELOW ARE SYMPTOMS THAT SHOULD BE REPORTED IMMEDIATELY: ?*FEVER GREATER THAN 100.4 F (38 ?C) OR HIGHER ?*CHILLS OR SWEATING ?*NAUSEA AND VOMITING THAT IS NOT CONTROLLED WITH YOUR NAUSEA MEDICATION ?*UNUSUAL SHORTNESS OF BREATH ?*UNUSUAL BRUISING OR BLEEDING ?*URINARY PROBLEMS (pain or burning when urinating, or frequent urination) ?*BOWEL PROBLEMS (unusual diarrhea, constipation, pain near the anus) ?TENDERNESS IN MOUTH AND THROAT WITH OR WITHOUT PRESENCE OF ULCERS (sore throat, sores in mouth, or a toothache) ?UNUSUAL RASH, SWELLING OR PAIN  ?UNUSUAL VAGINAL DISCHARGE OR ITCHING  ? ?Items with * indicate a potential emergency and should be followed up as soon as possible or go to the Emergency Department if any problems should occur. ? ?Please show the CHEMOTHERAPY ALERT CARD or IMMUNOTHERAPY ALERT CARD at check-in to the  Emergency Department and triage nurse. ? ?Should you have questions after your visit or need to cancel or reschedule your appointment, please contact [facility] CANCER [place] AT [place]  [phone] and follow the prompts.  Office hours are 8:00 a.m. to 4:30 p.m. Monday - Friday. Please note that voicemails left after 4:00 p.m. may not be returned until the following business day.  We are closed weekends and major holidays. You have access to a nurse at all times for urgent questions. Please call the main number to the clinic [phone] and follow the prompts. ? ?For any non-urgent questions, you may also contact your provider using MyChart. We now offer e-Visits for anyone 92 and older to request care online for non-urgent symptoms. For details visit [email or web address]. ?  ?Also download the MyChart app! Go to the app store, search "MyChart", open the app, select [place], and log in with your MyChart username and password. ? ?Due to Covid, a mask is required upon entering the hospital/clinic. If you do not have a mask, one will be given to you upon arrival. For doctor visits, patients may have 1 support person aged 43 or older with them. For treatment visits, patients cannot have anyone with them due to current Covid guidelines and our immunocompromised population.  ?

## 2021-05-17 ENCOUNTER — Inpatient Hospital Stay: Payer: Medicare Other

## 2021-05-17 VITALS — BP 115/63 | HR 94 | Temp 97.3°F | Resp 20

## 2021-05-17 DIAGNOSIS — C9201 Acute myeloblastic leukemia, in remission: Secondary | ICD-10-CM | POA: Diagnosis not present

## 2021-05-17 DIAGNOSIS — C9310 Chronic myelomonocytic leukemia not having achieved remission: Secondary | ICD-10-CM

## 2021-05-17 DIAGNOSIS — H269 Unspecified cataract: Secondary | ICD-10-CM | POA: Diagnosis not present

## 2021-05-17 DIAGNOSIS — Z79899 Other long term (current) drug therapy: Secondary | ICD-10-CM | POA: Diagnosis not present

## 2021-05-17 MED ORDER — ONDANSETRON HCL 4 MG PO TABS
8.0000 mg | ORAL_TABLET | Freq: Once | ORAL | Status: AC
  Administered 2021-05-17: 8 mg via ORAL
  Filled 2021-05-17: qty 2

## 2021-05-17 MED ORDER — AZACITIDINE CHEMO SQ INJECTION
75.0000 mg/m2 | Freq: Once | INTRAMUSCULAR | Status: AC
  Administered 2021-05-17: 200 mg via SUBCUTANEOUS
  Filled 2021-05-17: qty 8

## 2021-05-17 NOTE — Patient Instructions (Signed)
[facility] CANCER CTR AT [place]  Discharge Instructions: ?Thank you for choosing [place] to provide your oncology and hematology care.  ?If you have a lab appointment with the [place], please go directly to the [place] and check in at the registration area. ? ?Wear comfortable clothing and clothing appropriate for easy access to any Portacath or PICC line.  ? ?We strive to give you quality time with your provider. You may need to reschedule your appointment if you arrive late (15 or more minutes).  Arriving late affects you and other patients whose appointments are after yours.  Also, if you miss three or more appointments without notifying the office, you may be dismissed from the clinic at the provider?s discretion.    ?  ?For prescription refill requests, have your pharmacy contact our office and allow 72 hours for refills to be completed.   ? ?Today you received the following chemotherapy and/or immunotherapy agents Vidaza    ?  ?To help prevent nausea and vomiting after your treatment, we encourage you to take your nausea medication as directed. ? ?BELOW ARE SYMPTOMS THAT SHOULD BE REPORTED IMMEDIATELY: ?*FEVER GREATER THAN 100.4 F (38 ?C) OR HIGHER ?*CHILLS OR SWEATING ?*NAUSEA AND VOMITING THAT IS NOT CONTROLLED WITH YOUR NAUSEA MEDICATION ?*UNUSUAL SHORTNESS OF BREATH ?*UNUSUAL BRUISING OR BLEEDING ?*URINARY PROBLEMS (pain or burning when urinating, or frequent urination) ?*BOWEL PROBLEMS (unusual diarrhea, constipation, pain near the anus) ?TENDERNESS IN MOUTH AND THROAT WITH OR WITHOUT PRESENCE OF ULCERS (sore throat, sores in mouth, or a toothache) ?UNUSUAL RASH, SWELLING OR PAIN  ?UNUSUAL VAGINAL DISCHARGE OR ITCHING  ? ?Items with * indicate a potential emergency and should be followed up as soon as possible or go to the Emergency Department if any problems should occur. ? ?Please show the CHEMOTHERAPY ALERT CARD or IMMUNOTHERAPY ALERT CARD at check-in to the  Emergency Department and triage nurse. ? ?Should you have questions after your visit or need to cancel or reschedule your appointment, please contact [facility] CANCER [place] AT [place]  [phone] and follow the prompts.  Office hours are 8:00 a.m. to 4:30 p.m. Monday - Friday. Please note that voicemails left after 4:00 p.m. may not be returned until the following business day.  We are closed weekends and major holidays. You have access to a nurse at all times for urgent questions. Please call the main number to the clinic [phone] and follow the prompts. ? ?For any non-urgent questions, you may also contact your provider using MyChart. We now offer e-Visits for anyone 18 and older to request care online for non-urgent symptoms. For details visit [email or web address]. ?  ?Also download the MyChart app! Go to the app store, search "MyChart", open the app, select [place], and log in with your MyChart username and password. ? ?Due to Covid, a mask is required upon entering the hospital/clinic. If you do not have a mask, one will be given to you upon arrival. For doctor visits, patients may have 1 support person aged 42 or older with them. For treatment visits, patients cannot have anyone with them due to current Covid guidelines and our immunocompromised population.  ?

## 2021-05-18 ENCOUNTER — Inpatient Hospital Stay: Payer: Medicare Other

## 2021-05-18 ENCOUNTER — Other Ambulatory Visit: Payer: Self-pay

## 2021-05-18 VITALS — BP 127/62 | HR 97 | Temp 97.6°F | Resp 16

## 2021-05-18 DIAGNOSIS — C9310 Chronic myelomonocytic leukemia not having achieved remission: Secondary | ICD-10-CM

## 2021-05-18 DIAGNOSIS — C9201 Acute myeloblastic leukemia, in remission: Secondary | ICD-10-CM | POA: Diagnosis not present

## 2021-05-18 DIAGNOSIS — Z79899 Other long term (current) drug therapy: Secondary | ICD-10-CM | POA: Diagnosis not present

## 2021-05-18 DIAGNOSIS — H269 Unspecified cataract: Secondary | ICD-10-CM | POA: Diagnosis not present

## 2021-05-18 MED ORDER — AZACITIDINE CHEMO SQ INJECTION
75.0000 mg/m2 | Freq: Once | INTRAMUSCULAR | Status: AC
  Administered 2021-05-18: 200 mg via SUBCUTANEOUS
  Filled 2021-05-18: qty 8

## 2021-05-18 MED ORDER — ONDANSETRON HCL 4 MG PO TABS
8.0000 mg | ORAL_TABLET | Freq: Once | ORAL | Status: AC
  Administered 2021-05-18: 8 mg via ORAL
  Filled 2021-05-18: qty 2

## 2021-05-18 NOTE — Patient Instructions (Signed)
[facility] CANCER CTR AT [place]  Discharge Instructions: ?Thank you for choosing [facility] to provide your oncology and hematology care.  ?If you have a lab appointment with the [place] [facility], please go directly to the [place] and check in at the registration area. ? ?Wear comfortable clothing and clothing appropriate for easy access to any Portacath or PICC line.  ? ?We strive to give you quality time with your provider. You may need to reschedule your appointment if you arrive late (15 or more minutes).  Arriving late affects you and other patients whose appointments are after yours.  Also, if you miss three or more appointments without notifying the office, you may be dismissed from the clinic at the provider?s discretion.    ?  ?For prescription refill requests, have your pharmacy contact our office and allow 72 hours for refills to be completed.   ? ?Today you received the following chemotherapy and/or immunotherapy agents Vidaza    ?  ?To help prevent nausea and vomiting after your treatment, we encourage you to take your nausea medication as directed. ? ?BELOW ARE SYMPTOMS THAT SHOULD BE REPORTED IMMEDIATELY: ?*FEVER GREATER THAN 100.4 F (38 ?C) OR HIGHER ?*CHILLS OR SWEATING ?*NAUSEA AND VOMITING THAT IS NOT CONTROLLED WITH YOUR NAUSEA MEDICATION ?*UNUSUAL SHORTNESS OF BREATH ?*UNUSUAL BRUISING OR BLEEDING ?*URINARY PROBLEMS (pain or burning when urinating, or frequent urination) ?*BOWEL PROBLEMS (unusual diarrhea, constipation, pain near the anus) ?TENDERNESS IN MOUTH AND THROAT WITH OR WITHOUT PRESENCE OF ULCERS (sore throat, sores in mouth, or a toothache) ?UNUSUAL RASH, SWELLING OR PAIN  ?UNUSUAL VAGINAL DISCHARGE OR ITCHING  ? ?Items with * indicate a potential emergency and should be followed up as soon as possible or go to the Emergency Department if any problems should occur. ? ?Please show the CHEMOTHERAPY ALERT CARD or IMMUNOTHERAPY ALERT CARD at check-in to the  Emergency Department and triage nurse. ? ?Should you have questions after your visit or need to cancel or reschedule your appointment, please contact [facility] CANCER [place] AT [place]  [phone] and follow the prompts.  Office hours are 8:00 a.m. to 4:30 p.m. Monday - Friday. Please note that voicemails left after 4:00 p.m. may not be returned until the following business day.  We are closed weekends and major holidays. You have access to a nurse at all times for urgent questions. Please call the main number to the clinic [phone] and follow the prompts. ? ?For any non-urgent questions, you may also contact your provider using MyChart. We now offer e-Visits for anyone 22 and older to request care online for non-urgent symptoms. For details visit [email or web address]. ?  ?Also download the MyChart app! Go to the app store, search "MyChart", open the app, select [place], and log in with your MyChart username and password. ? ?Due to Covid, a mask is required upon entering the hospital/clinic. If you do not have a mask, one will be given to you upon arrival. For doctor visits, patients may have 1 support person aged 17 or older with them. For treatment visits, patients cannot have anyone with them due to current Covid guidelines and our immunocompromised population.  ?

## 2021-05-19 IMAGING — CT CT BONE MARROW BIOPSY AND ASPIRATION
1 of 2 series · 10 of 14 positions shown, 13 images · non-contrast
Comparison: none

INDICATION: UNSPECIFIED LEUKOCYTOSIS, THROMBOCYTOSIS

[Series 2: i-spiral 5.0 b30f · axial · 0.98mm/px · z∈[-272,-178]mm · 10 of 34 slices shown, 13 images]
[im 4/34  soft-tissue]
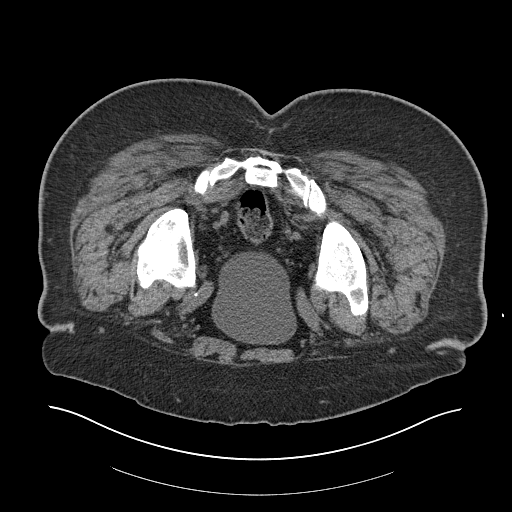
[im 4/34  bone]
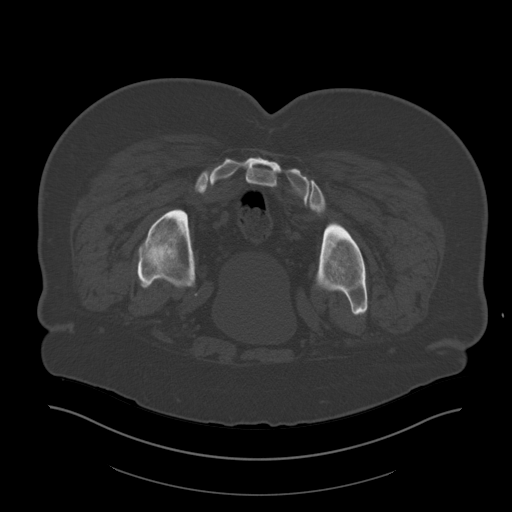
[im 7/34  bone]
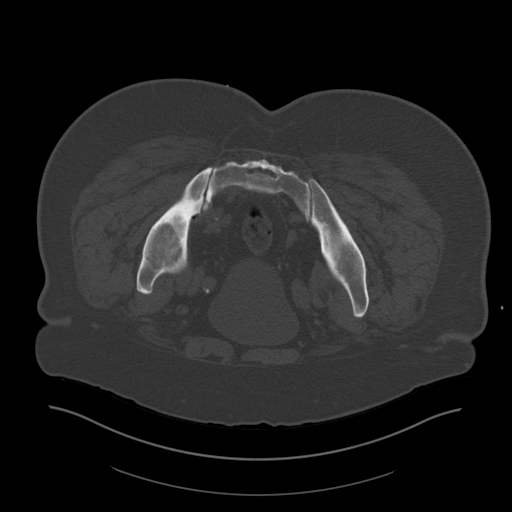
[im 10/34  bone]
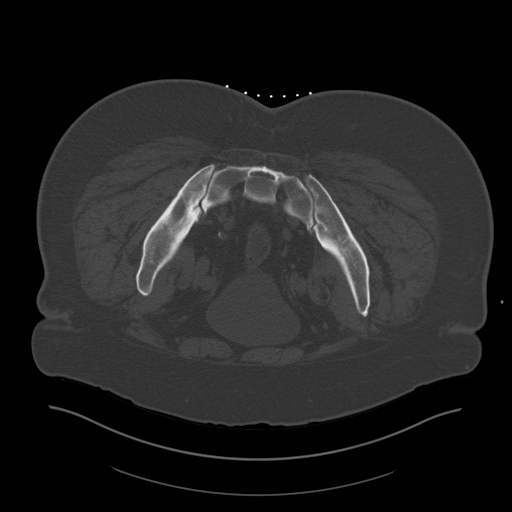
[im 13/34  bone]
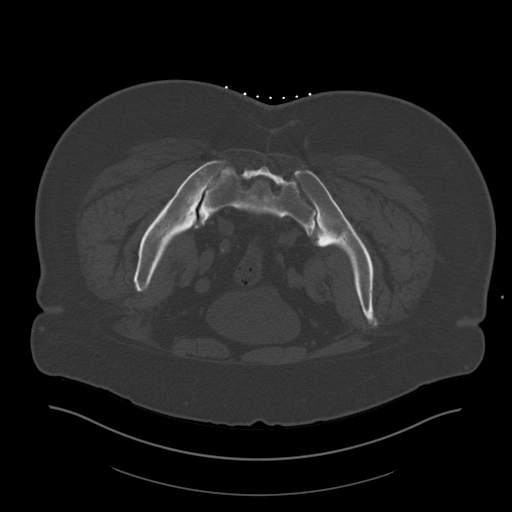
[im 16/34  soft-tissue]
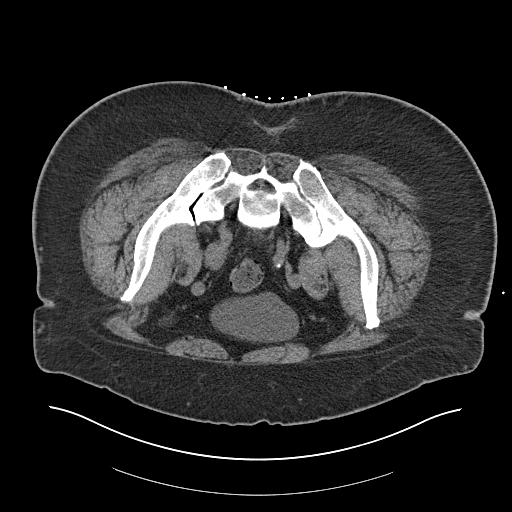
[im 16/34  bone]
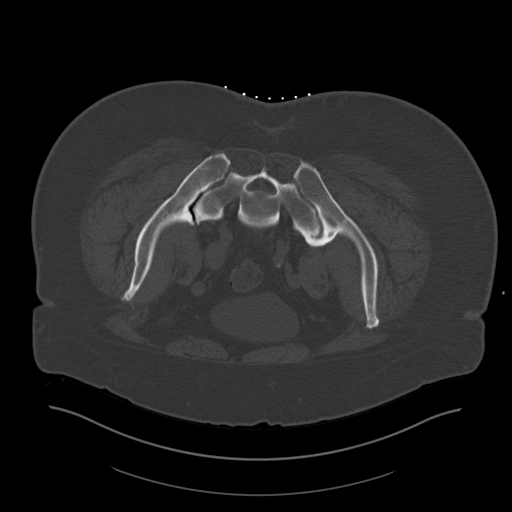
[im 19/34  bone]
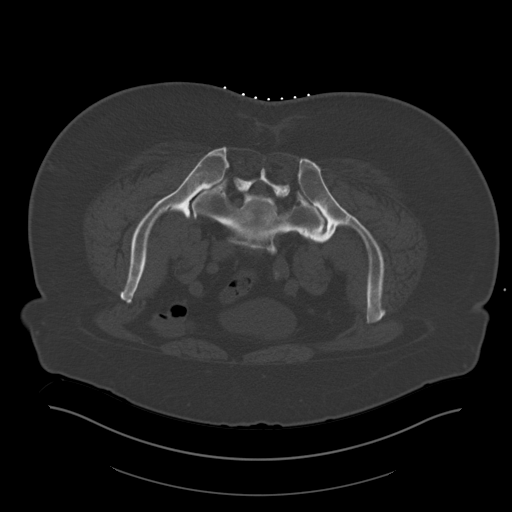
[im 22/34  bone]
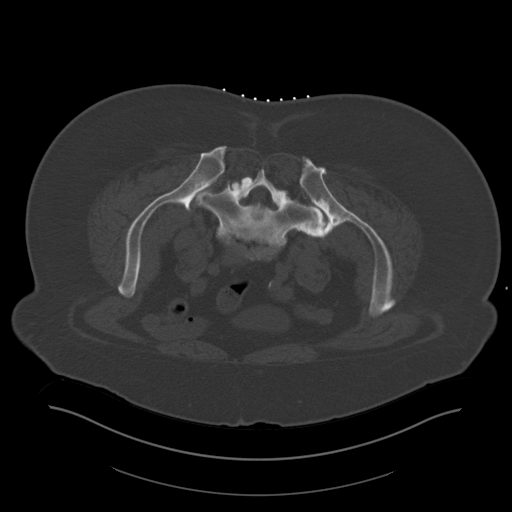
[im 25/34  bone]
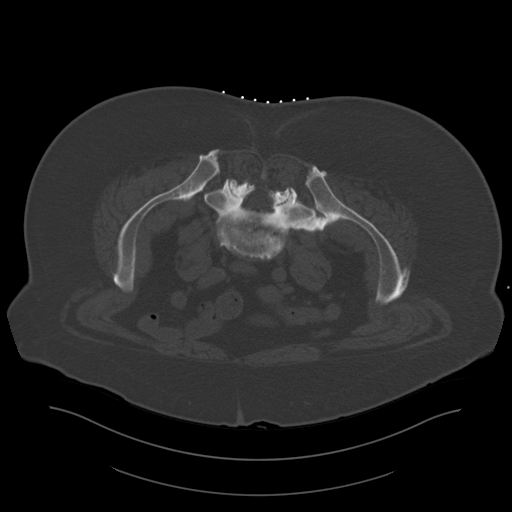
[im 28/34  soft-tissue]
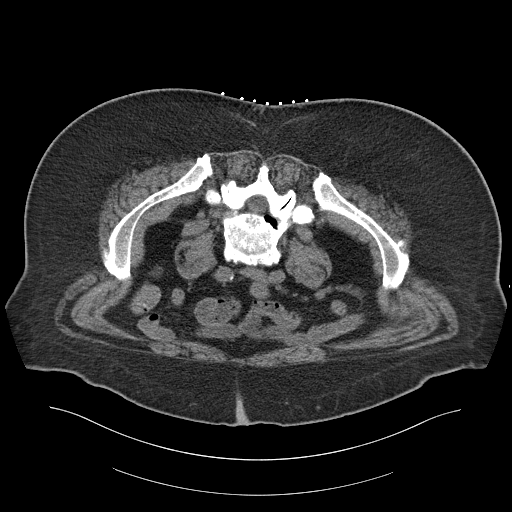
[im 28/34  bone]
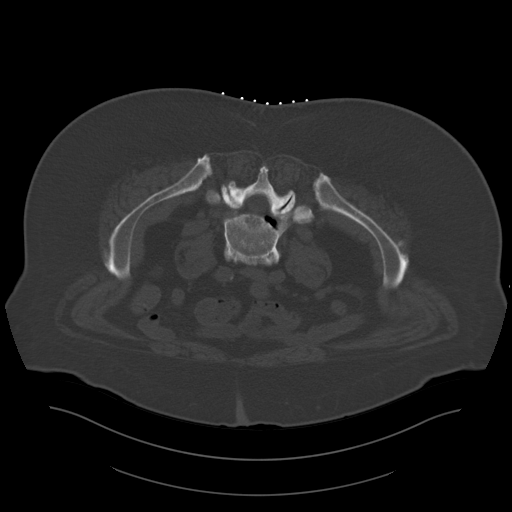
[im 31/34  bone]
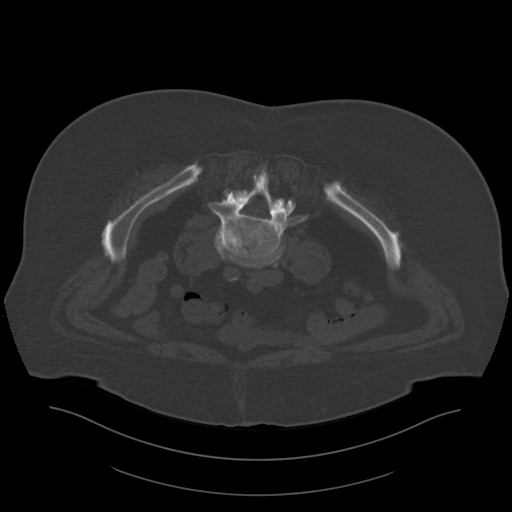

[10 of 14 positions shown; findings below may reference images not displayed]

EXAM:
CT GUIDED RIGHT ILIAC BONE MARROW ASPIRATION AND CORE BIOPSY

Radiologist:  Pollak, Jesy

Guidance:  CT

FLUOROSCOPY TIME:  Fluoroscopy Time: None.

MEDICATIONS:
1% lidocaine local

ANESTHESIA/SEDATION:
3.5 mg IV Versed; 75 mcg IV Fentanyl

Moderate Sedation Time:  14 minutes

The patient was continuously monitored during the procedure by the
interventional radiology nurse under my direct supervision.

CONTRAST:  None.

COMPLICATIONS:
None

PROCEDURE:
Informed consent was obtained from the patient following explanation
of the procedure, risks, benefits and alternatives. The patient
understands, agrees and consents for the procedure. All questions
were addressed. A time out was performed.

The patient was positioned prone and non-contrast localization CT
was performed of the pelvis to demonstrate the iliac marrow spaces.

Maximal barrier sterile technique utilized including caps, mask,
sterile gowns, sterile gloves, large sterile drape, hand hygiene,
and Betadine prep.

Under sterile conditions and local anesthesia, an 11 gauge coaxial
bone biopsy needle was advanced into the right iliac marrow space.
Needle position was confirmed with CT imaging. Initially, bone
marrow aspiration was performed. Next, the 11 gauge outer cannula
was utilized to obtain a right iliac bone marrow core biopsy. Needle
was removed. Hemostasis was obtained with compression. The patient
tolerated the procedure well. Samples were prepared with the
cytotechnologist. No immediate complications.
IMPRESSION: CT guided right iliac bone marrow aspiration and core biopsy.

## 2021-05-22 ENCOUNTER — Other Ambulatory Visit: Payer: Self-pay | Admitting: *Deleted

## 2021-05-22 DIAGNOSIS — C9310 Chronic myelomonocytic leukemia not having achieved remission: Secondary | ICD-10-CM

## 2021-05-22 DIAGNOSIS — C9200 Acute myeloblastic leukemia, not having achieved remission: Secondary | ICD-10-CM

## 2021-05-22 NOTE — Progress Notes (Signed)
Entered labs

## 2021-05-23 ENCOUNTER — Inpatient Hospital Stay: Payer: Medicare Other

## 2021-05-23 ENCOUNTER — Other Ambulatory Visit: Payer: Self-pay

## 2021-05-23 ENCOUNTER — Ambulatory Visit: Payer: Medicare Other | Admitting: Oncology

## 2021-05-23 ENCOUNTER — Other Ambulatory Visit: Payer: Medicare Other

## 2021-05-23 ENCOUNTER — Ambulatory Visit: Payer: Medicare Other

## 2021-05-23 ENCOUNTER — Inpatient Hospital Stay (HOSPITAL_BASED_OUTPATIENT_CLINIC_OR_DEPARTMENT_OTHER): Payer: Medicare Other | Admitting: Hospice and Palliative Medicine

## 2021-05-23 VITALS — BP 115/69 | HR 94 | Temp 97.4°F | Resp 16 | Ht 75.0 in | Wt 296.3 lb

## 2021-05-23 DIAGNOSIS — Z79899 Other long term (current) drug therapy: Secondary | ICD-10-CM | POA: Diagnosis not present

## 2021-05-23 DIAGNOSIS — C9200 Acute myeloblastic leukemia, not having achieved remission: Secondary | ICD-10-CM

## 2021-05-23 DIAGNOSIS — C9201 Acute myeloblastic leukemia, in remission: Secondary | ICD-10-CM | POA: Diagnosis not present

## 2021-05-23 DIAGNOSIS — D509 Iron deficiency anemia, unspecified: Secondary | ICD-10-CM

## 2021-05-23 DIAGNOSIS — C9310 Chronic myelomonocytic leukemia not having achieved remission: Secondary | ICD-10-CM

## 2021-05-23 DIAGNOSIS — H269 Unspecified cataract: Secondary | ICD-10-CM | POA: Diagnosis not present

## 2021-05-23 LAB — CBC WITH DIFFERENTIAL/PLATELET
Abs Immature Granulocytes: 0.05 10*3/uL (ref 0.00–0.07)
Basophils Absolute: 0 10*3/uL (ref 0.0–0.1)
Basophils Relative: 0 %
Eosinophils Absolute: 0 10*3/uL (ref 0.0–0.5)
Eosinophils Relative: 0 %
HCT: 28.6 % — ABNORMAL LOW (ref 39.0–52.0)
Hemoglobin: 8.9 g/dL — ABNORMAL LOW (ref 13.0–17.0)
Immature Granulocytes: 1 %
Lymphocytes Relative: 22 %
Lymphs Abs: 0.8 10*3/uL (ref 0.7–4.0)
MCH: 29.2 pg (ref 26.0–34.0)
MCHC: 31.1 g/dL (ref 30.0–36.0)
MCV: 93.8 fL (ref 80.0–100.0)
Monocytes Absolute: 0.6 10*3/uL (ref 0.1–1.0)
Monocytes Relative: 16 %
Neutro Abs: 2.3 10*3/uL (ref 1.7–7.7)
Neutrophils Relative %: 61 %
Platelets: 128 10*3/uL — ABNORMAL LOW (ref 150–400)
RBC: 3.05 MIL/uL — ABNORMAL LOW (ref 4.22–5.81)
RDW: 25.2 % — ABNORMAL HIGH (ref 11.5–15.5)
WBC: 3.8 10*3/uL — ABNORMAL LOW (ref 4.0–10.5)
nRBC: 0.5 % — ABNORMAL HIGH (ref 0.0–0.2)

## 2021-05-23 LAB — SAMPLE TO BLOOD BANK

## 2021-05-23 LAB — IRON AND TIBC
Iron: 49 ug/dL (ref 45–182)
Saturation Ratios: 20 % (ref 17.9–39.5)
TIBC: 241 ug/dL — ABNORMAL LOW (ref 250–450)
UIBC: 192 ug/dL

## 2021-05-23 LAB — FERRITIN: Ferritin: 77 ng/mL (ref 24–336)

## 2021-05-23 NOTE — Progress Notes (Signed)
Pt requesting medical clearance from our office for cataract removal surgery ?

## 2021-05-23 NOTE — Progress Notes (Signed)
? ?Symptom Management Clinic ?[place] at [facility] ?[phone] [phone] ? ?Patient Care Team: ?Lavera Guise, MD as PCP - General (Internal Medicine) ?Sindy Guadeloupe, MD as Consulting Physician (Oncology) ?Edythe Clarity, [facility] as Pharmacist (Pharmacist)  ? ?Name of the patient: Samuel Frank  ?[id number]  ?[birth year]  ? ?Date of visit: 05/23/21 ? ?Reason for Consult: ?Samuel Gust. is a 81 y.o. 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  Patient was started on venetoclax +5 days ago on 02/14/2021.  Bone marrow biopsy after cycle 1 showed morphologic remission. ? ?Patient last saw Dr. Janese Frank on 05/13/2021 for cycle 2 Vidaza.  Cycle 3 is planned for 06/13/2021.  Patient continues on venetoclax daily for 3 weeks and 1 week off.  He is also on Valtrex prophylaxis. ? ?Patient returns to clinic today for routine follow-up.  He denies any symptomatic complaints.  No bleeding or recent infections.  He reports he is doing well.  He still working on his Samuel Frank. ? ?Patient does ask for medical clearance for his cataract surgery. ? ?Denies any neurologic complaints. Denies recent fevers or illnesses. Denies any easy bleeding or bruising. Reports good appetite and denies weight loss. Denies chest pain. Denies any nausea, vomiting, constipation, or diarrhea. Denies urinary complaints. Patient offers no further specific complaints today. ? ?PAST MEDICAL HISTORY: ?Past Medical History:  ?Diagnosis Date  ? Anemia   ? 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 Dyspnea   ? Hypertension   ? ? ?PAST SURGICAL HISTORY:  ?Past Surgical History:  ?Procedure Laterality Date  ? COLONOSCOPY N/A 08/28/2019  ? Procedure: COLONOSCOPY;  Surgeon: Toledo, Benay Pike, MD;  Location: [facility] ENDOSCOPY;  Service: Gastroenterology;  Laterality: N/A;  ? COLONOSCOPY WITH PROPOFOL N/A 05/03/2015  ?  Procedure: COLONOSCOPY WITH PROPOFOL;  Surgeon: [name], MD;  Location: [facility] ENDOSCOPY;  Service: Gastroenterology;  Laterality: N/A;  ? ESOPHAGOGASTRODUODENOSCOPY N/A 08/28/2019  ? Procedure: ESOPHAGOGASTRODUODENOSCOPY (EGD);  Surgeon: Toledo, Benay Pike, MD;  Location: [facility] ENDOSCOPY;  Service: Gastroenterology;  Laterality: N/A;  ? ESOPHAGOGASTRODUODENOSCOPY (EGD) WITH PROPOFOL N/A 05/03/2015  ? Procedure: ESOPHAGOGASTRODUODENOSCOPY (EGD) WITH PROPOFOL;  Surgeon: [name], MD;  Location: [facility] ENDOSCOPY;  Service: Gastroenterology;  Laterality: N/A;  ? HERNIA REPAIR    ? 1975  ? ? 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  ?09/30/2018 -  Chemotherapy  ? Patient is on Treatment Plan : CMML Azacitidine SQ D1-7 q28d  ?   ? ? ?ALLERGIES:  has No Known Allergies. ? ?MEDICATIONS:  ?Current Outpatient Medications  ?Medication Sig Dispense Refill  ? allopurinol (ZYLOPRIM) 300 MG tablet Take 1 tablet (300 mg total) by mouth daily. 90 tablet 2  ? ferrous sulfate 325 (65 FE) MG tablet Take 325 mg by mouth daily.    ? furosemide (LASIX) 20 MG tablet Take one tab po qd for swelling 30 tablet 3  ? hydrochlorothiazide (MICROZIDE) 12.5 MG capsule TAKE 1 CAPSULE BY MOUTH EVERY DAY 90 capsule 3  ? Melatonin 10 MG CAPS Take by mouth daily as needed.    ? pantoprazole (PROTONIX) 40 MG tablet TAKE 1 TABLET BY MOUTH EVERY DAY 90 tablet 1  ? potassium chloride SA (KLOR-CON M20) 20 MEQ tablet Take 1 tablet (20 mEq total) by mouth  2 (two) times daily. 180 tablet 1  ? prochlorperazine (COMPAZINE) 10 MG tablet Take 10 mg by mouth every 6 (six) hours as needed.    ? tranexamic acid (LYSTEDA) 650 MG TABS tablet Take 1,300 mg by mouth 3 (three) times daily.    ? traZODone (DESYREL) 50 MG tablet TAKE 1 TABLET BY MOUTH EVERYDAY AT BEDTIME 90 tablet 1  ? valACYclovir  (VALTREX) 500 MG tablet Take 1 tablet (500 mg total) by mouth daily. 90 tablet 1  ? venetoclax (VENCLEXTA) 100 MG tablet Take 400 mg by mouth daily. Take for 21 days, then hold for 7 days. Repeat every 28 days.    ? ?No current facility-administered medications for this visit.  ? ? ?VITAL SIGNS: ?Ht '6\' 3"'  (1.905 m)   Wt 296 lb 4.8 oz (134.4 kg)   BMI 37.03 kg/m?  ?Filed Weights  ? 05/23/21 0829  ?Weight: 296 lb 4.8 oz (134.4 kg)  ?  ?Estimated body mass index is 37.03 kg/m? as calculated from the following: ?  Height as of this encounter: '6\' 3"'  (1.905 m). ?  Weight as of this encounter: 296 lb 4.8 oz (134.4 kg). ? ?LABS: ?CBC: ?   ?Component Value Date/Time  ? WBC 3.8 (L) 05/23/2021 0758  ? HGB 8.9 (L) 05/23/2021 0758  ? HGB 7.1 (L) 08/21/2018 0851  ? HCT 28.6 (L) 05/23/2021 0758  ? HCT 23.0 (L) 08/21/2018 0851  ? PLT 128 (L) 05/23/2021 0758  ? PLT 791 (H) 08/21/2018 0851  ? MCV 93.8 05/23/2021 0758  ? MCV 84 08/21/2018 0851  ? NEUTROABS 2.3 05/23/2021 0758  ? LYMPHSABS 0.8 05/23/2021 0758  ? MONOABS 0.6 05/23/2021 0758  ? EOSABS 0.0 05/23/2021 0758  ? BASOSABS 0.0 05/23/2021 0758  ? ?Comprehensive Metabolic Panel: ?   ?Component Value Date/Time  ? NA 134 (L) 05/16/2021 1242  ? NA 136 07/22/2018 0811  ? K 3.6 05/16/2021 1242  ? CL 100 05/16/2021 1242  ? CO2 28 05/16/2021 1242  ? BUN 6 (L) 05/16/2021 1242  ? BUN 16 07/22/2018 0811  ? CREATININE 0.73 05/16/2021 1242  ? GLUCOSE 104 (H) 05/16/2021 1242  ? CALCIUM 8.5 (L) 05/16/2021 1242  ? AST 13 (L) 05/16/2021 1242  ? ALT 9 05/16/2021 1242  ? ALKPHOS 88 05/16/2021 1242  ? BILITOT 0.8 05/16/2021 1242  ? BILITOT 0.3 07/22/2018 0811  ? PROT 7.1 05/16/2021 1242  ? PROT 7.4 07/22/2018 0811  ? ALBUMIN 3.3 (L) 05/16/2021 1242  ? ALBUMIN 4.1 07/22/2018 0811  ? ? ?RADIOGRAPHIC STUDIES: ?No results found. ? ?PERFORMANCE STATUS (ECOG) : 0 - Asymptomatic ? ?Review of Systems ?Unless otherwise noted, a complete review of systems is negative. ? ?Physical Exam ?General: NAD ?HEENT:  No cervical lymphadenopathy ?Cardiovascular: regular rate and rhythm ?Pulmonary: clear anterior/posterior fields ?Abdomen: soft, nontender, + bowel sounds ?GU: no suprapubic tenderness ?Extremities: no edema, no joint deformities ?Skin: no rashes ?Neurological: Grossly nonfocal ? ?Assessment and Plan- Patient is a 81 y.o. 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  Patient was started on venetoclax +5 days ago on 02/14/2021.  Bone marrow biopsy after cycle 1 showed morphologic remission. ?  ?AML  -on Vidaza/venetoclax.  Seems to be tolerating well.  Counts stable relatively unchanged from previous baseline.  Hemoglobin 8.9 and no need for transfusion today.  Iron studies pending but can consider IV iron if needed. ? ?Cataracts -Dr. Janese Frank is reaching out to Dr. Janene Madeira regarding medical clearance ? ?Case and plan discussed with Dr. Janese Frank.  Patient to RTC as previously scheduled in April ? ? ?Patient expressed understanding and was in agreement with this plan. He also understands that He can call clinic at any time with any questions, concerns, or complaints.  ? ?Thank you for allowing me to participate in the care of this very pleasant patient.  ? ?Time Total: 15 minutes ? ?Visit consisted of counseling and education dealing with the complex and emotionally intense issues of symptom management in the setting of serious illness.Greater than 50%  of this time was spent counseling and coordinating care related to the above assessment and plan. ? ?Signed by: ?Altha Harm, PhD, NP-C ? ? ? ? ?

## 2021-05-23 NOTE — Progress Notes (Signed)
Patient Hgb 8.9 today, per NP no blood/ any treatment needed.  ?

## 2021-05-23 NOTE — Progress Notes (Signed)
Pt made aware that Dr. Janene Madeira recommends patient to have cataracts removed 1-2 days prior to his next Vidaza appointment(06/13/21). Verbalized understanding at this time. ?

## 2021-05-24 ENCOUNTER — Ambulatory Visit: Payer: Medicare Other

## 2021-05-25 ENCOUNTER — Other Ambulatory Visit: Payer: Self-pay | Admitting: *Deleted

## 2021-05-25 ENCOUNTER — Ambulatory Visit: Payer: Medicare Other

## 2021-05-25 DIAGNOSIS — C9201 Acute myeloblastic leukemia, in remission: Secondary | ICD-10-CM

## 2021-05-25 MED ORDER — PROCHLORPERAZINE MALEATE 10 MG PO TABS: 10.0000 mg | ORAL_TABLET | Freq: Four times a day (QID) | ORAL | 1 refills | Status: AC | PRN

## 2021-05-25 NOTE — Telephone Encounter (Signed)
Patient called requesting a refill of his Compazine and also asking about when he can have his cataract surgery stating that [place] is waiting for [place] to send them approval for surgery ?

## 2021-05-25 NOTE — Unmapped (Signed)
[facility] Refill Coordination Note    Specialty Medication(s) to be Shipped:   Hematology/Oncology: Venclexta    Other medication(s) to be shipped: No additional medications requested for fill at this time     Gary Baird, DOB: 08-27-1940  Phone: [phone] (home)       All above HIPAA information was verified with patient's family member, Daughter.     Was a Nurse, learning disability used for this call? No    Completed refill call assessment today to schedule patient's medication shipment from the [facility] [phone]).  All relevant notes have been reviewed.     Specialty medication(s) and dose(s) confirmed: Regimen is correct and unchanged.   Changes to medications: Gary Baird reports no changes at this time.  Changes to insurance: No  New side effects reported not previously addressed with a pharmacist or physician: None reported  Questions for the pharmacist: No    Confirmed patient received a Conservation officer, historic buildings and a Surveyor, mining with first shipment. The patient will receive a drug information handout for each medication shipped and additional FDA Medication Guides as required.       DISEASE/MEDICATION-SPECIFIC INFORMATION        N/A    SPECIALTY MEDICATION ADHERENCE     Medication Adherence    Patient reported X missed doses in the last month: 0  Specialty Medication: VENCLEXTA 100 mg tablet (venetoclax)  Patient is on additional specialty medications: No  Informant: child/children              Were doses missed due to medication being on hold? No    VENCLEXTA 100 mg: unknown days of medicine on hand.  New cycle starts 06/13/21     REFERRAL TO PHARMACIST     Referral to the pharmacist: Not needed      [facility]     Shipping address confirmed in Epic.     Delivery Scheduled: Yes, Expected medication delivery date: 06/08/21.     Medication will be delivered via Next Day Courier to the prescription address in Epic [place].    [name] [name]   [facility] Specialty Technician

## 2021-05-26 ENCOUNTER — Ambulatory Visit: Payer: Medicare Other

## 2021-05-27 ENCOUNTER — Ambulatory Visit: Payer: Medicare Other

## 2021-05-27 DIAGNOSIS — M1712 Unilateral primary osteoarthritis, left knee: Secondary | ICD-10-CM | POA: Diagnosis not present

## 2021-05-27 DIAGNOSIS — I83029 Varicose veins of left lower extremity with ulcer of unspecified site: Secondary | ICD-10-CM | POA: Diagnosis not present

## 2021-05-27 DIAGNOSIS — L97929 Non-pressure chronic ulcer of unspecified part of left lower leg with unspecified severity: Secondary | ICD-10-CM | POA: Diagnosis not present

## 2021-05-27 DIAGNOSIS — M1711 Unilateral primary osteoarthritis, right knee: Secondary | ICD-10-CM | POA: Diagnosis not present

## 2021-05-30 ENCOUNTER — Telehealth: Payer: Self-pay | Admitting: *Deleted

## 2021-05-30 ENCOUNTER — Ambulatory Visit: Payer: Medicare Other

## 2021-05-30 ENCOUNTER — Encounter: Payer: Self-pay | Admitting: Oncology

## 2021-05-30 NOTE — Telephone Encounter (Signed)
Call from [place] stating that the clearance form was faxed back signed, but nothing marked as to whether patient is cleared for surgery or not. Please refax form with this information marked on it or call her back ?

## 2021-05-30 NOTE — Telephone Encounter (Signed)
Reached out to [place] at [facility] and she will be re-faxing the clearance form for the pt.  ?

## 2021-05-31 ENCOUNTER — Ambulatory Visit: Payer: Medicare Other

## 2021-06-01 ENCOUNTER — Telehealth: Payer: Medicare Other

## 2021-06-02 ENCOUNTER — Telehealth: Payer: Self-pay | Admitting: *Deleted

## 2021-06-02 NOTE — Telephone Encounter (Signed)
Dr. 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 Also when he takes vidaza SQ he also takes a cancer pill -venetoclax and he starts this pill the same day of vidaza and he takes the pill daily for 3 weeks. ? ?His next appt is 4/10 for the treatment  so  per Dr. Janese Banks the patient should get the cataract surgery 2-3 days before he is going to start his cycle on 4/10. He will need labs 1-2 days before the surgery to make sure the blood counts are good for him to have the surgery  ? ?Caryl Pina said she will work on seeing if the surgery can be done with the dates above. Otherwise she will call us back and try to get it done in May ? ?Caryl Pina has my direct cell phone ?

## 2021-06-02 NOTE — Telephone Encounter (Signed)
I Have reached out to [facility] at [facility] and went over when is the best time to get sugery due to he is on cancer treatment for AML. I put a big note in about this ?

## 2021-06-05 ENCOUNTER — Other Ambulatory Visit: Payer: Self-pay | Admitting: *Deleted

## 2021-06-05 ENCOUNTER — Encounter: Payer: Self-pay | Admitting: *Deleted

## 2021-06-05 DIAGNOSIS — C9200 Acute myeloblastic leukemia, not having achieved remission: Secondary | ICD-10-CM

## 2021-06-06 ENCOUNTER — Telehealth: Payer: Self-pay | Admitting: *Deleted

## 2021-06-06 NOTE — Telephone Encounter (Signed)
Called pt and left message and said that he needs blood work on 4/4 at 8:30. This is the lab that is needed to see if he can have cataract surgery. Left my phone number if he has issues and I also sent my chart to his daughter Doree Albee ?

## 2021-06-07 ENCOUNTER — Telehealth: Payer: Self-pay | Admitting: *Deleted

## 2021-06-07 ENCOUNTER — Inpatient Hospital Stay: Payer: Medicare Other | Attending: Nurse Practitioner

## 2021-06-07 DIAGNOSIS — Z5111 Encounter for antineoplastic chemotherapy: Secondary | ICD-10-CM | POA: Insufficient documentation

## 2021-06-07 DIAGNOSIS — C9201 Acute myeloblastic leukemia, in remission: Secondary | ICD-10-CM | POA: Diagnosis not present

## 2021-06-07 DIAGNOSIS — Z79899 Other long term (current) drug therapy: Secondary | ICD-10-CM | POA: Insufficient documentation

## 2021-06-07 DIAGNOSIS — D509 Iron deficiency anemia, unspecified: Secondary | ICD-10-CM

## 2021-06-07 DIAGNOSIS — C9200 Acute myeloblastic leukemia, not having achieved remission: Secondary | ICD-10-CM

## 2021-06-07 LAB — CBC WITH DIFFERENTIAL/PLATELET
Abs Immature Granulocytes: 0.03 10*3/uL (ref 0.00–0.07)
Basophils Absolute: 0 10*3/uL (ref 0.0–0.1)
Basophils Relative: 1 %
Eosinophils Absolute: 0 10*3/uL (ref 0.0–0.5)
Eosinophils Relative: 0 %
HCT: 32.7 % — ABNORMAL LOW (ref 39.0–52.0)
Hemoglobin: 10 g/dL — ABNORMAL LOW (ref 13.0–17.0)
Immature Granulocytes: 1 %
Lymphocytes Relative: 18 %
Lymphs Abs: 0.8 10*3/uL (ref 0.7–4.0)
MCH: 29.1 pg (ref 26.0–34.0)
MCHC: 30.6 g/dL (ref 30.0–36.0)
MCV: 95.1 fL (ref 80.0–100.0)
Monocytes Absolute: 0.7 10*3/uL (ref 0.1–1.0)
Monocytes Relative: 15 %
Neutro Abs: 3.1 10*3/uL (ref 1.7–7.7)
Neutrophils Relative %: 65 %
Platelets: 367 10*3/uL (ref 150–400)
RBC: 3.44 MIL/uL — ABNORMAL LOW (ref 4.22–5.81)
RDW: 24.3 % — ABNORMAL HIGH (ref 11.5–15.5)
Smear Review: ADEQUATE
WBC: 4.7 10*3/uL (ref 4.0–10.5)
nRBC: 0 % (ref 0.0–0.2)

## 2021-06-07 LAB — SAMPLE TO BLOOD BANK

## 2021-06-07 MED ORDER — FERROUS SULFATE 325 MG (65 MG IRON) TABLET
ORAL_TABLET | 1 refills | 0 days
Start: 2021-06-07 — End: ?

## 2021-06-07 MED FILL — VENCLEXTA 100 MG TABLET: ORAL | 28 days supply | Qty: 84 | Fill #2

## 2021-06-07 NOTE — Telephone Encounter (Signed)
Spoke and faxed to [place] at [facility] about pt can get cataract surgery 4/5 or 4/6. ?. Everything ready for his to get procedure done ?

## 2021-06-07 NOTE — Unmapped (Signed)
The prescription was discontinued on 03/20/2021.

## 2021-06-08 NOTE — Unmapped (Signed)
I called Odella about cancelling her Dad's visit with Dr. Vertell Limber to assess her dad for his next treatment cycle    We discussed that he missed his appointment after his bone marrow biopsy and again this month.  Odella expressed her understanding of the requirement to see our team each month. We reviewed that as prescribers and experts for her dad's AML, we need to see him physically to ensure safe care coordination with [name].    The cataract surgery on 4/6 was a last minute rescheduling of prior appointments.  They are relieved to have this finally done.    We discussed having her dad come 5/4 at 8:30 and she committed to this time.

## 2021-06-09 DIAGNOSIS — Z01818 Encounter for other preprocedural examination: Secondary | ICD-10-CM | POA: Diagnosis not present

## 2021-06-09 DIAGNOSIS — H25812 Combined forms of age-related cataract, left eye: Secondary | ICD-10-CM | POA: Diagnosis not present

## 2021-06-10 ENCOUNTER — Other Ambulatory Visit: Payer: Self-pay

## 2021-06-10 DIAGNOSIS — C9200 Acute myeloblastic leukemia, not having achieved remission: Secondary | ICD-10-CM

## 2021-06-13 ENCOUNTER — Ambulatory Visit (INDEPENDENT_AMBULATORY_CARE_PROVIDER_SITE_OTHER): Payer: Medicare Other

## 2021-06-13 ENCOUNTER — Inpatient Hospital Stay: Payer: Medicare Other

## 2021-06-13 ENCOUNTER — Encounter: Payer: Self-pay | Admitting: Oncology

## 2021-06-13 ENCOUNTER — Ambulatory Visit (INDEPENDENT_AMBULATORY_CARE_PROVIDER_SITE_OTHER): Payer: Medicare Other | Admitting: Nurse Practitioner

## 2021-06-13 ENCOUNTER — Inpatient Hospital Stay (HOSPITAL_BASED_OUTPATIENT_CLINIC_OR_DEPARTMENT_OTHER): Payer: Medicare Other | Admitting: Oncology

## 2021-06-13 ENCOUNTER — Encounter (INDEPENDENT_AMBULATORY_CARE_PROVIDER_SITE_OTHER): Payer: Self-pay | Admitting: Nurse Practitioner

## 2021-06-13 VITALS — BP 110/63 | HR 92 | Temp 99.0°F | Resp 16 | Wt 286.0 lb

## 2021-06-13 VITALS — BP 123/74 | HR 93 | Resp 17 | Ht 74.5 in | Wt 285.0 lb

## 2021-06-13 DIAGNOSIS — D701 Agranulocytosis secondary to cancer chemotherapy: Secondary | ICD-10-CM | POA: Diagnosis not present

## 2021-06-13 DIAGNOSIS — C9200 Acute myeloblastic leukemia, not having achieved remission: Secondary | ICD-10-CM

## 2021-06-13 DIAGNOSIS — M7989 Other specified soft tissue disorders: Secondary | ICD-10-CM

## 2021-06-13 DIAGNOSIS — D509 Iron deficiency anemia, unspecified: Secondary | ICD-10-CM

## 2021-06-13 DIAGNOSIS — Z5111 Encounter for antineoplastic chemotherapy: Secondary | ICD-10-CM | POA: Diagnosis not present

## 2021-06-13 DIAGNOSIS — A809 Acute poliomyelitis, unspecified: Secondary | ICD-10-CM | POA: Diagnosis not present

## 2021-06-13 DIAGNOSIS — C9201 Acute myeloblastic leukemia, in remission: Secondary | ICD-10-CM | POA: Diagnosis not present

## 2021-06-13 DIAGNOSIS — Z79899 Other long term (current) drug therapy: Secondary | ICD-10-CM

## 2021-06-13 DIAGNOSIS — C9310 Chronic myelomonocytic leukemia not having achieved remission: Secondary | ICD-10-CM

## 2021-06-13 DIAGNOSIS — T451X5A Adverse effect of antineoplastic and immunosuppressive drugs, initial encounter: Secondary | ICD-10-CM | POA: Diagnosis not present

## 2021-06-13 DIAGNOSIS — I10 Essential (primary) hypertension: Secondary | ICD-10-CM

## 2021-06-13 LAB — CBC WITH DIFFERENTIAL/PLATELET
Abs Immature Granulocytes: 0.04 10*3/uL (ref 0.00–0.07)
Basophils Absolute: 0.1 10*3/uL (ref 0.0–0.1)
Basophils Relative: 2 %
Eosinophils Absolute: 0 10*3/uL (ref 0.0–0.5)
Eosinophils Relative: 1 %
HCT: 34.2 % — ABNORMAL LOW (ref 39.0–52.0)
Hemoglobin: 10.5 g/dL — ABNORMAL LOW (ref 13.0–17.0)
Immature Granulocytes: 1 %
Lymphocytes Relative: 24 %
Lymphs Abs: 0.7 10*3/uL (ref 0.7–4.0)
MCH: 28.5 pg (ref 26.0–34.0)
MCHC: 30.7 g/dL (ref 30.0–36.0)
MCV: 92.9 fL (ref 80.0–100.0)
Monocytes Absolute: 0.4 10*3/uL (ref 0.1–1.0)
Monocytes Relative: 14 %
Neutro Abs: 1.7 10*3/uL (ref 1.7–7.7)
Neutrophils Relative %: 58 %
Platelets: 391 10*3/uL (ref 150–400)
RBC: 3.68 MIL/uL — ABNORMAL LOW (ref 4.22–5.81)
RDW: 23.9 % — ABNORMAL HIGH (ref 11.5–15.5)
Smear Review: NORMAL
WBC: 2.9 10*3/uL — ABNORMAL LOW (ref 4.0–10.5)
nRBC: 0 % (ref 0.0–0.2)

## 2021-06-13 LAB — SAMPLE TO BLOOD BANK

## 2021-06-13 LAB — COMPREHENSIVE METABOLIC PANEL
ALT: 10 U/L (ref 0–44)
AST: 13 U/L — ABNORMAL LOW (ref 15–41)
Albumin: 3.5 g/dL (ref 3.5–5.0)
Alkaline Phosphatase: 71 U/L (ref 38–126)
Anion gap: 6 (ref 5–15)
BUN: 5 mg/dL — ABNORMAL LOW (ref 8–23)
CO2: 27 mmol/L (ref 22–32)
Calcium: 8.7 mg/dL — ABNORMAL LOW (ref 8.9–10.3)
Chloride: 101 mmol/L (ref 98–111)
Creatinine, Ser: 0.7 mg/dL (ref 0.61–1.24)
GFR, Estimated: >60 mL/min (ref >60)
Glucose, Bld: 109 mg/dL — ABNORMAL HIGH (ref 70–99)
Potassium: 3.5 mmol/L (ref 3.5–5.1)
Sodium: 134 mmol/L — ABNORMAL LOW (ref 135–145)
Total Bilirubin: 0.6 mg/dL (ref 0.3–1.2)
Total Protein: 7.4 g/dL (ref 6.5–8.1)

## 2021-06-13 MED ORDER — ONDANSETRON HCL 4 MG PO TABS
8.0000 mg | ORAL_TABLET | Freq: Once | ORAL | Status: AC
  Administered 2021-06-13: 8 mg via ORAL
  Filled 2021-06-13: qty 2

## 2021-06-13 MED ORDER — AZACITIDINE CHEMO SQ INJECTION
75.0000 mg/m2 | Freq: Once | INTRAMUSCULAR | Status: AC
  Administered 2021-06-13: 200 mg via SUBCUTANEOUS
  Filled 2021-06-13: qty 8

## 2021-06-13 NOTE — Progress Notes (Signed)
Patient here for pre treatment check he has no complaints or questions today. ?

## 2021-06-13 NOTE — Progress Notes (Signed)
? ? ? ?Hematology/Oncology Consult note ?[place]  ?[phone] [phone] ? ?Patient Care Team: ?Lavera Guise, MD as PCP - General (Internal Medicine) ?Sindy Guadeloupe, MD as Consulting Physician (Oncology) ?Edythe Clarity, [facility] as Pharmacist (Pharmacist)  ? ?Name of the patient: Samuel Frank  ?[id number]  ?12-21-1940  ? ?Date of visit: 06/13/21 ? 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 ?Chief complaint/ Reason for visit-on treatment assessment prior to cycle 3 of Vidaza ? ?Heme/Onc history: Patient is a 81 yr old male with no significant co morbidities other than hypertension.  He has recently been having bilateral knee pain and has undergone steroid injection in his knee.Patient was noted to have a high white count on his routine exam.  His white count was 19.2 on 07/22/2018 with an H&H of 11.4/35.1 and a platelet count of 470.  At that point he was prescribed a course of antibiotics and a repeat blood work on 08/21/2018 showed white count of 42.3, H&H of 7.1/23 with an MCV of 84 and a platelet count of 791 and hence the patient was referred to hematology for further management.   ?  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  In addition the core biopsy showed a small focus of fibrosis containing scattering of mast cells and eosinophils most suggestive of systemic mastocytosis.bone marrow blasts 7%.  Flow cytometry showed increased number of monocytic cells representing 16% of all cells with expression for HLA-DR, CD11b, CD13, CD14, CD33, CD38, CD56 and CD56 and CD64 with [facility] CD 117 are CD34. normal cytogenetics, CBL, SRSF2, SH2B3 and TET2  ?  ?Repeat bone marrow biopsy after 4 cycles of Vidaza showed less pronounced monocytic/granulocytic proliferation and overall better granulopoiesis and no increase in blasts.  In addition features of systemic mastocytosis not present. ?  ?After  9 cycles of Vidaza patient was noted to have significant anemia and worsening thrombocytosis for which a repeat bone marrow biopsy was done.  Bone marrow did not show any features of progression of CMML.  Overall stable findings and no increased blast.He was found to have iron deficiency and received 2 doses of Feraheme. ?  ?[facility] and underwent EGD and colonoscopy for iron deficiency anemia.Colonoscopy showed nonbleeding internal hemorrhoids.  Normal mucosa in the colon.  No polyps EGD showed normal esophagus and gastric mucosal atrophy.  No stigmata of bleeding ?  ?Patient presented to the ER in November 2022 with significant fatigue and found to have an anemia with a hemoglobin of 5 and significant hyperleukocytosis.  He was transferred to [facility] where it was confirmed that he had transformed to AML.  He was seen by Dr. Janene Madeira as an outpatient and started venetoclax plus Vidaza on 02/14/2021.  Bone marrow biopsy after cycle 1 showed morphologic remission ?  ? ?Interval history-patient underwent left cataract surgery and is able to see much better.  He still due to undergo same surgery in his right eye.  He reports mild fatigue but denies other complaints.  Denies any blood loss in the stool or urine.  Denies any dark melanotic stools. ? ?ECOG PS- 1 ?Pain scale- 0 ? ? ?Review of systems- Review of Systems  ?Constitutional:  Positive for malaise/fatigue. Negative for chills, fever and weight loss.  ?HENT:  Negative for congestion, ear discharge and nosebleeds.   ?Eyes:  Negative for blurred vision.  ?Respiratory:  Negative for cough, hemoptysis, sputum production,  shortness of breath and wheezing.   ?Cardiovascular:  Negative for chest pain, palpitations, orthopnea and claudication.  ?Gastrointestinal:  Negative for abdominal pain, blood in stool, constipation, diarrhea, heartburn, melena, nausea and vomiting.  ?Genitourinary:  Negative for dysuria, flank pain, frequency, hematuria and urgency.   ?Musculoskeletal:  Negative for back pain, joint pain and myalgias.  ?Skin:  Negative for rash.  ?Neurological:  Negative for dizziness, tingling, focal weakness, seizures, weakness and headaches.  ?Endo/Heme/Allergies:  Does not bruise/bleed easily.  ?Psychiatric/Behavioral:  Negative for depression and suicidal ideas. The patient does not have insomnia.    ? ? ?No Known Allergies ? ? ?Past Medical History:  ?Diagnosis Date  ? Anemia   ? 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 Dyspnea   ? Hypertension   ? ? ? ?Past Surgical History:  ?Procedure Laterality Date  ? COLONOSCOPY N/A 08/28/2019  ? Procedure: COLONOSCOPY;  Surgeon: Toledo, Benay Pike, MD;  Location: [facility] ENDOSCOPY;  Service: Gastroenterology;  Laterality: N/A;  ? COLONOSCOPY WITH PROPOFOL N/A 05/03/2015  ? Procedure: COLONOSCOPY WITH PROPOFOL;  Surgeon: [name], MD;  Location: [facility] ENDOSCOPY;  Service: Gastroenterology;  Laterality: N/A;  ? ESOPHAGOGASTRODUODENOSCOPY N/A 08/28/2019  ? Procedure: ESOPHAGOGASTRODUODENOSCOPY (EGD);  Surgeon: Toledo, Benay Pike, MD;  Location: [facility] ENDOSCOPY;  Service: Gastroenterology;  Laterality: N/A;  ? ESOPHAGOGASTRODUODENOSCOPY (EGD) WITH PROPOFOL N/A 05/03/2015  ? Procedure: ESOPHAGOGASTRODUODENOSCOPY (EGD) WITH PROPOFOL;  Surgeon: [name], MD;  Location[facility] ENDOSCOPY;  Service: Gastroenterology;  Laterality: N/A;  ? HERNIA REPAIR    ? 1975  ? ? ?Social History  ? ?Socioeconomic History  ? Marital status: Divorced  ?  Spouse name: Not on file  ? Number of children: 4  ? Years of education: Not on file  ? Highest education level: Not on file  ?Occupational History  ? Occupation: retired  ?  Comment: [employer]  ?Tobacco Use  ? Smoking status: Never  ? Smokeless tobacco: Never  ?Vaping Use  ? Vaping Use: Never used  ?Substance and Sexual Activity  ? Alcohol use: No  ? Drug use: No  ? Sexual activity: Not Currently  ?Other Topics Concern  ? Not on  file  ?Social History Narrative  ? Not on file  ? ?Social Determinants of Health  ? ?Financial Resource Strain: Low Risk   ? Difficulty of Paying Living Expenses: Not very hard  ?Food Insecurity: Not on file  ?Transportation Needs: Not on file  ?Physical Activity: Not on file  ?Stress: Not on file  ?Social Connections: Not on file  ?Intimate Partner Violence: Not on file  ? ? ?Family History  ?Problem Relation Age of Onset  ? Alzheimer's disease Father   ? Lung cancer Sister   ? Cancer Sister   ? ? ? ?Current Outpatient Medications:  ?  allopurinol (ZYLOPRIM) 300 MG tablet, Take 1 tablet (300 mg total) by mouth daily., Disp: 90 tablet, Rfl: 2 ?  ferrous sulfate 325 (65 FE) MG tablet, Take 325 mg by mouth daily., Disp: , Rfl:  ?  furosemide (LASIX) 20 MG tablet, Take one tab po qd for swelling, Disp: 30 tablet, Rfl: 3 ?  hydrochlorothiazide (MICROZIDE) 12.5 MG capsule, TAKE 1 CAPSULE BY MOUTH EVERY DAY, Disp: 90 capsule, Rfl: 3 ?  pantoprazole (PROTONIX) 40 MG tablet, TAKE 1 TABLET BY MOUTH EVERY DAY, Disp: 90 tablet, Rfl: 1 ?  potassium chloride SA (KLOR-CON M20) 20 MEQ tablet, Take 1  tablet (20 mEq total) by mouth 2 (two) times daily., Disp: 180 tablet, Rfl: 1 ?  prochlorperazine (COMPAZINE) 10 MG tablet, Take 1 tablet (10 mg total) by mouth every 6 (six) hours as needed., Disp: 30 tablet, Rfl: 1 ?  tranexamic acid (LYSTEDA) 650 MG TABS tablet, Take 1,300 mg by mouth 3 (three) times daily., Disp: , Rfl:  ?  traZODone (DESYREL) 50 MG tablet, TAKE 1 TABLET BY MOUTH EVERYDAY AT BEDTIME, Disp: 90 tablet, Rfl: 1 ?  valACYclovir (VALTREX) 500 MG tablet, Take 1 tablet (500 mg total) by mouth daily., Disp: 90 tablet, Rfl: 1 ?  venetoclax (VENCLEXTA) 100 MG tablet, Take 400 mg by mouth daily. Take for 21 days, then hold for 7 days. Repeat every 28 days., Disp: , Rfl:  ? ?Physical exam:  ?Vitals:  ? 06/13/21 0952  ?BP: 110/63  ?Pulse: 92  ?Resp: 16  ?Temp: 99 ?F (37.2 ?C)  ?TempSrc: Tympanic  ?SpO2: 100%  ?Weight: 286 lb  (129.7 kg)  ? ?Physical Exam ?Constitutional:   ?   General: He is not in acute distress. ?Cardiovascular:  ?   Rate and Rhythm: Normal rate and regular rhythm.  ?   Heart sounds: Normal heart sounds.  ?P

## 2021-06-13 NOTE — Patient Instructions (Signed)
[facility] AT [place]  Discharge Instructions: ?Thank you for choosing [facility] to provide your oncology and hematology care.  ?If you have a lab appointment with the [facility], please go directly to the [facility] and check in at the registration area. ? ?Wear comfortable clothing and clothing appropriate for easy access to any Portacath or PICC line.  ? ?We strive to give you quality time with your provider. You may need to reschedule your appointment if you arrive late (15 or more minutes).  Arriving late affects you and other patients whose appointments are after yours.  Also, if you miss three or more appointments without notifying the office, you may be dismissed from the clinic at the provider?s discretion.    ?  ?For prescription refill requests, have your pharmacy contact our office and allow 72 hours for refills to be completed.   ? ?Today you received the following chemotherapy and/or immunotherapy agents vidaza   ?  ?To help prevent nausea and vomiting after your treatment, we encourage you to take your nausea medication as directed. ? ?BELOW ARE SYMPTOMS THAT SHOULD BE REPORTED IMMEDIATELY: ?*FEVER GREATER THAN 100.4 F (38 ?C) OR HIGHER ?*CHILLS OR SWEATING ?*NAUSEA AND VOMITING THAT IS NOT CONTROLLED WITH YOUR NAUSEA MEDICATION ?*UNUSUAL SHORTNESS OF BREATH ?*UNUSUAL BRUISING OR BLEEDING ?*URINARY PROBLEMS (pain or burning when urinating, or frequent urination) ?*BOWEL PROBLEMS (unusual diarrhea, constipation, pain near the anus) ?TENDERNESS IN MOUTH AND THROAT WITH OR WITHOUT PRESENCE OF ULCERS (sore throat, sores in mouth, or a toothache) ?UNUSUAL RASH, SWELLING OR PAIN  ?UNUSUAL VAGINAL DISCHARGE OR ITCHING  ? ?Items with * indicate a potential emergency and should be followed up as soon as possible or go to the Emergency Department if any problems should occur. ? ?Please show the CHEMOTHERAPY ALERT CARD or IMMUNOTHERAPY ALERT CARD at check-in to the  Emergency Department and triage nurse. ? ?Should you have questions after your visit or need to cancel or reschedule your appointment, please contact [facility] [place] AT [place]  [phone] and follow the prompts.  Office hours are 8:00 a.m. to 4:30 p.m. Monday - Friday. Please note that voicemails left after 4:00 p.m. may not be returned until the following business day.  We are closed weekends and major holidays. You have access to a nurse at all times for urgent questions. Please call the main number to the clinic [phone] and follow the prompts. ? ?For any non-urgent questions, you may also contact your provider using MyChart. We now offer e-Visits for anyone 13 and older to request care online for non-urgent symptoms. For details visit [email or web address]. ?  ?Also download the MyChart app! Go to the app store, search "MyChart", open the app, select [place], and log in with your MyChart username and password. ? ?Due to Covid, a mask is required upon entering the hospital/clinic. If you do not have a mask, one will be given to you upon arrival. For doctor visits, patients may have 1 support person aged 67 or older with them. For treatment visits, patients cannot have anyone with them due to current Covid guidelines and our immunocompromised population.  ?

## 2021-06-14 ENCOUNTER — Inpatient Hospital Stay: Payer: Medicare Other

## 2021-06-14 ENCOUNTER — Telehealth: Payer: Self-pay | Admitting: Pharmacist

## 2021-06-14 VITALS — BP 134/69 | HR 99 | Temp 98.3°F | Resp 18

## 2021-06-14 DIAGNOSIS — Z5111 Encounter for antineoplastic chemotherapy: Secondary | ICD-10-CM | POA: Diagnosis not present

## 2021-06-14 DIAGNOSIS — C9310 Chronic myelomonocytic leukemia not having achieved remission: Secondary | ICD-10-CM

## 2021-06-14 DIAGNOSIS — Z79899 Other long term (current) drug therapy: Secondary | ICD-10-CM | POA: Diagnosis not present

## 2021-06-14 DIAGNOSIS — C9201 Acute myeloblastic leukemia, in remission: Secondary | ICD-10-CM | POA: Diagnosis not present

## 2021-06-14 MED ORDER — ONDANSETRON HCL 4 MG PO TABS
8.0000 mg | ORAL_TABLET | Freq: Once | ORAL | Status: AC
  Administered 2021-06-14: 8 mg via ORAL
  Filled 2021-06-14: qty 2

## 2021-06-14 MED ORDER — AZACITIDINE CHEMO SQ INJECTION
75.0000 mg/m2 | Freq: Once | INTRAMUSCULAR | Status: AC
  Administered 2021-06-14: 200 mg via SUBCUTANEOUS
  Filled 2021-06-14: qty 8

## 2021-06-14 NOTE — Patient Instructions (Signed)
[facility] CANCER CTR AT [place]  Discharge Instructions: ?Thank you for choosing [facility] to provide your oncology and hematology care.  ?If you have a lab appointment with the [facility], please go directly to the [facility] and check in at the registration area. ? ?Wear comfortable clothing and clothing appropriate for easy access to any Portacath or PICC line.  ? ?We strive to give you quality time with your provider. You may need to reschedule your appointment if you arrive late (15 or more minutes).  Arriving late affects you and other patients whose appointments are after yours.  Also, if you miss three or more appointments without notifying the office, you may be dismissed from the clinic at the provider?s discretion.    ?  ?For prescription refill requests, have your pharmacy contact our office and allow 72 hours for refills to be completed.   ? ?Today you received the following chemotherapy and/or immunotherapy agents VIDAZA    ?  ?To help prevent nausea and vomiting after your treatment, we encourage you to take your nausea medication as directed. ? ?BELOW ARE SYMPTOMS THAT SHOULD BE REPORTED IMMEDIATELY: ?*FEVER GREATER THAN 100.4 F (38 ?C) OR HIGHER ?*CHILLS OR SWEATING ?*NAUSEA AND VOMITING THAT IS NOT CONTROLLED WITH YOUR NAUSEA MEDICATION ?*UNUSUAL SHORTNESS OF BREATH ?*UNUSUAL BRUISING OR BLEEDING ?*URINARY PROBLEMS (pain or burning when urinating, or frequent urination) ?*BOWEL PROBLEMS (unusual diarrhea, constipation, pain near the anus) ?TENDERNESS IN MOUTH AND THROAT WITH OR WITHOUT PRESENCE OF ULCERS (sore throat, sores in mouth, or a toothache) ?UNUSUAL RASH, SWELLING OR PAIN  ?UNUSUAL VAGINAL DISCHARGE OR ITCHING  ? ?Items with * indicate a potential emergency and should be followed up as soon as possible or go to the Emergency Department if any problems should occur. ? ?Please show the CHEMOTHERAPY ALERT CARD or IMMUNOTHERAPY ALERT CARD at check-in to the  Emergency Department and triage nurse. ? ?Should you have questions after your visit or need to cancel or reschedule your appointment, please contact [facility] CANCER [place] AT [place]  [phone] and follow the prompts.  Office hours are 8:00 a.m. to 4:30 p.m. Monday - Friday. Please note that voicemails left after 4:00 p.m. may not be returned until the following business day.  We are closed weekends and major holidays. You have access to a nurse at all times for urgent questions. Please call the main number to the clinic [phone] and follow the prompts. ? ?For any non-urgent questions, you may also contact your provider using MyChart. We now offer e-Visits for anyone 29 and older to request care online for non-urgent symptoms. For details visit [email or web address]. ?  ?Also download the MyChart app! Go to the app store, search "MyChart", open the app, select [place], and log in with your MyChart username and password. ? ?Due to Covid, a mask is required upon entering the hospital/clinic. If you do not have a mask, one will be given to you upon arrival. For doctor visits, patients may have 1 support person aged 20 or older with them. For treatment visits, patients cannot have anyone with them due to current Covid guidelines and our immunocompromised population.  ? ? ?Azacitidine suspension for injection (subcutaneous use) ?What is this medication? ?AZACITIDINE (ay [place]) is a chemotherapy drug. This medicine reduces the growth of cancer cells and can suppress the immune system. It is used for treating myelodysplastic syndrome or some types of leukemia. ?This medicine may be used for other purposes;  ask your health care provider or pharmacist if you have questions. ?COMMON BRAND NAME(S): Vidaza ?What should I tell my care team before I take this medication? ?They need to know if you have any of these conditions: ?kidney disease ?liver disease ?liver tumors ?an unusual or allergic  reaction to azacitidine, mannitol, other medicines, foods, dyes, or preservatives ?pregnant or trying to get pregnant ?breast-feeding ?How should I use this medication? ?This medicine is for injection under the skin. It is administered in a hospital or clinic by a specially trained health care professional. ?Talk to your pediatrician regarding the use of this medicine in children. While this drug may be prescribed for selected conditions, precautions do apply. ?Overdosage: If you think you have taken too much of this medicine contact a poison control center or emergency room at once. ?NOTE: This medicine is only for you. Do not share this medicine with others. ?What if I miss a dose? ?It is important not to miss your dose. Call your doctor or health care professional if you are unable to keep an appointment. ?What may interact with this medication? ?Interactions have not been studied. ?Give your health care provider a list of all the medicines, herbs, non-prescription drugs, or dietary supplements you use. Also tell them if you smoke, drink alcohol, or use illegal drugs. Some items may interact with your medicine. ?This list may not describe all possible interactions. Give your health care provider a list of all the medicines, herbs, non-prescription drugs, or dietary supplements you use. Also tell them if you smoke, drink alcohol, or use illegal drugs. Some items may interact with your medicine. ?What should I watch for while using this medication? ?Visit your doctor for checks on your progress. This drug may make you feel generally unwell. This is not uncommon, as chemotherapy can affect healthy cells as well as cancer cells. Report any side effects. Continue your course of treatment even though you feel ill unless your doctor tells you to stop. ?In some cases, you may be given additional medicines to help with side effects. Follow all directions for their use. ?Call your doctor or health care professional for  advice if you get a fever, chills or sore throat, or other symptoms of a cold or flu. Do not treat yourself. This drug decreases your body's ability to fight infections. Try to avoid being around people who are sick. ?This medicine may increase your risk to bruise or bleed. Call your doctor or health care professional if you notice any unusual bleeding. ?You may need blood work done while you are taking this medicine. ?Do not become pregnant while taking this medicine and for 6 months after the last dose. Women should inform their doctor if they wish to become pregnant or think they might be pregnant. Men should not father a child while taking this medicine and for 3 months after the last dose. There is a potential for serious side effects to an unborn child. Talk to your health care professional or pharmacist for more information. Do not breast-feed an infant while taking this medicine and for 1 week after the last dose. ?This medicine may interfere with the ability to have a child. Talk with your doctor or health care professional if you are concerned about your fertility. ?What side effects may I notice from receiving this medication? ?Side effects that you should report to your doctor or health care professional as soon as possible: ?allergic reactions like skin rash, itching or hives, swelling of the face,  lips, or tongue ?low blood counts - this medicine may decrease the number of white blood cells, red blood cells and platelets. You may be at increased risk for infections and bleeding. ?signs of infection - fever or chills, cough, sore throat, pain passing urine ?signs of decreased platelets or bleeding - bruising, pinpoint red spots on the skin, black, tarry stools, blood in the urine ?signs of decreased red blood cells - unusually weak or tired, fainting spells, lightheadedness ?signs and symptoms of kidney injury like trouble passing urine or change in the amount of urine ?signs and symptoms of liver  injury like dark yellow or brown urine; general ill feeling or flu-like symptoms; light-colored stools; loss of appetite; nausea; right upper belly pain; unusually weak or tired; yellowing of the eyes or skin ?

## 2021-06-14 NOTE — Progress Notes (Signed)
Error. Patient is followed by [facility] / ls,CMA ?

## 2021-06-15 ENCOUNTER — Inpatient Hospital Stay: Payer: Medicare Other

## 2021-06-15 VITALS — BP 110/64 | HR 115 | Temp 98.8°F | Resp 18

## 2021-06-15 DIAGNOSIS — C9310 Chronic myelomonocytic leukemia not having achieved remission: Secondary | ICD-10-CM

## 2021-06-15 DIAGNOSIS — Z79899 Other long term (current) drug therapy: Secondary | ICD-10-CM | POA: Diagnosis not present

## 2021-06-15 DIAGNOSIS — Z5111 Encounter for antineoplastic chemotherapy: Secondary | ICD-10-CM | POA: Diagnosis not present

## 2021-06-15 DIAGNOSIS — C9201 Acute myeloblastic leukemia, in remission: Secondary | ICD-10-CM | POA: Diagnosis not present

## 2021-06-15 MED ORDER — AZACITIDINE CHEMO SQ INJECTION
75.0000 mg/m2 | Freq: Once | INTRAMUSCULAR | Status: AC
  Administered 2021-06-15: 200 mg via SUBCUTANEOUS
  Filled 2021-06-15: qty 8

## 2021-06-15 MED ORDER — ONDANSETRON HCL 4 MG PO TABS
8.0000 mg | ORAL_TABLET | Freq: Once | ORAL | Status: AC
  Administered 2021-06-15: 8 mg via ORAL
  Filled 2021-06-15: qty 2

## 2021-06-15 NOTE — Patient Instructions (Signed)
[facility] CANCER CTR AT [place]-MEDICAL ONCOLOGY  Discharge Instructions: °Thank you for choosing [facility] to provide your oncology and hematology care.  ° °If you have a lab appointment with the Cancer Center, please go directly to the Cancer Center and check in at the registration area. °  °Wear comfortable clothing and clothing appropriate for easy access to any Portacath or PICC line.  ° °We strive to give you quality time with your provider. You may need to reschedule your appointment if you arrive late (15 or more minutes).  Arriving late affects you and other patients whose appointments are after yours.  Also, if you miss three or more appointments without notifying the office, you may be dismissed from the clinic at the provider’s discretion.    °  °For prescription refill requests, have your pharmacy contact our office and allow 72 hours for refills to be completed.   ° °Today you received the following chemotherapy and/or immunotherapy agents     °  °To help prevent nausea and vomiting after your treatment, we encourage you to take your nausea medication as directed. ° °BELOW ARE SYMPTOMS THAT SHOULD BE REPORTED IMMEDIATELY: °*FEVER GREATER THAN 100.4 F (38 °C) OR HIGHER °*CHILLS OR SWEATING °*NAUSEA AND VOMITING THAT IS NOT CONTROLLED WITH YOUR NAUSEA MEDICATION °*UNUSUAL SHORTNESS OF BREATH °*UNUSUAL BRUISING OR BLEEDING °*URINARY PROBLEMS (pain or burning when urinating, or frequent urination) °*BOWEL PROBLEMS (unusual diarrhea, constipation, pain near the anus) °TENDERNESS IN MOUTH AND THROAT WITH OR WITHOUT PRESENCE OF ULCERS (sore throat, sores in mouth, or a toothache) °UNUSUAL RASH, SWELLING OR PAIN  °UNUSUAL VAGINAL DISCHARGE OR ITCHING  ° °Items with * indicate a potential emergency and should be followed up as soon as possible or go to the Emergency Department if any problems should occur. ° °Please show the CHEMOTHERAPY ALERT CARD or IMMUNOTHERAPY ALERT CARD at check-in to the  Emergency Department and triage nurse. ° °Should you have questions after your visit or need to cancel or reschedule your appointment, please contact [facility] CANCER CTR AT [place]-MEDICAL ONCOLOGY  Dept: [phone]  and follow the prompts.  Office hours are 8:00 a.m. to 4:30 p.m. Monday - Friday. Please note that voicemails left after 4:00 p.m. may not be returned until the following business day.  We are closed weekends and major holidays. You have access to a nurse at all times for urgent questions. Please call the main number to the clinic Dept: [phone] and follow the prompts. ° ° °For any non-urgent questions, you may also contact your provider using MyChart. We now offer e-Visits for anyone 18 and older to request care online for non-urgent symptoms. For details visit [email or web address]. °  °Also download the MyChart app! Go to the app store, search "MyChart", open the app, select [facility], and log in with your MyChart username and password. ° °Due to Covid, a mask is required upon entering the hospital/clinic. If you do not have a mask, one will be given to you upon arrival. For doctor visits, patients may have 1 support person aged 18 or older with them. For treatment visits, patients cannot have anyone with them due to current Covid guidelines and our immunocompromised population.  ° °

## 2021-06-16 ENCOUNTER — Inpatient Hospital Stay: Payer: Medicare Other

## 2021-06-16 ENCOUNTER — Encounter: Payer: Self-pay | Admitting: Oncology

## 2021-06-16 VITALS — BP 118/63 | HR 120 | Temp 96.9°F | Resp 18

## 2021-06-16 DIAGNOSIS — A419 Sepsis, unspecified organism: Secondary | ICD-10-CM | POA: Diagnosis not present

## 2021-06-16 DIAGNOSIS — R509 Fever, unspecified: Secondary | ICD-10-CM | POA: Diagnosis not present

## 2021-06-16 DIAGNOSIS — K219 Gastro-esophageal reflux disease without esophagitis: Secondary | ICD-10-CM | POA: Diagnosis not present

## 2021-06-16 DIAGNOSIS — Z79891 Long term (current) use of opiate analgesic: Secondary | ICD-10-CM | POA: Diagnosis not present

## 2021-06-16 DIAGNOSIS — D72829 Elevated white blood cell count, unspecified: Secondary | ICD-10-CM | POA: Diagnosis not present

## 2021-06-16 DIAGNOSIS — I959 Hypotension, unspecified: Secondary | ICD-10-CM | POA: Diagnosis not present

## 2021-06-16 DIAGNOSIS — E440 Moderate protein-calorie malnutrition: Secondary | ICD-10-CM | POA: Diagnosis not present

## 2021-06-16 DIAGNOSIS — D75839 Thrombocytosis, unspecified: Secondary | ICD-10-CM | POA: Diagnosis not present

## 2021-06-16 DIAGNOSIS — R9389 Abnormal findings on diagnostic imaging of other specified body structures: Secondary | ICD-10-CM | POA: Diagnosis not present

## 2021-06-16 DIAGNOSIS — C9310 Chronic myelomonocytic leukemia not having achieved remission: Secondary | ICD-10-CM

## 2021-06-16 DIAGNOSIS — R197 Diarrhea, unspecified: Secondary | ICD-10-CM | POA: Diagnosis not present

## 2021-06-16 DIAGNOSIS — C9201 Acute myeloblastic leukemia, in remission: Secondary | ICD-10-CM | POA: Diagnosis not present

## 2021-06-16 DIAGNOSIS — E876 Hypokalemia: Secondary | ICD-10-CM | POA: Diagnosis not present

## 2021-06-16 DIAGNOSIS — D649 Anemia, unspecified: Secondary | ICD-10-CM | POA: Diagnosis not present

## 2021-06-16 DIAGNOSIS — E860 Dehydration: Secondary | ICD-10-CM | POA: Diagnosis not present

## 2021-06-16 DIAGNOSIS — Z66 Do not resuscitate: Secondary | ICD-10-CM | POA: Diagnosis not present

## 2021-06-16 DIAGNOSIS — Z20822 Contact with and (suspected) exposure to covid-19: Secondary | ICD-10-CM | POA: Diagnosis not present

## 2021-06-16 DIAGNOSIS — Z79899 Other long term (current) drug therapy: Secondary | ICD-10-CM | POA: Diagnosis not present

## 2021-06-16 DIAGNOSIS — D72821 Monocytosis (symptomatic): Secondary | ICD-10-CM | POA: Diagnosis not present

## 2021-06-16 DIAGNOSIS — I10 Essential (primary) hypertension: Secondary | ICD-10-CM | POA: Diagnosis not present

## 2021-06-16 DIAGNOSIS — Z5111 Encounter for antineoplastic chemotherapy: Secondary | ICD-10-CM | POA: Diagnosis not present

## 2021-06-16 DIAGNOSIS — Z9841 Cataract extraction status, right eye: Secondary | ICD-10-CM | POA: Diagnosis not present

## 2021-06-16 DIAGNOSIS — Z9842 Cataract extraction status, left eye: Secondary | ICD-10-CM | POA: Diagnosis not present

## 2021-06-16 LAB — BLOOD GAS CRITICAL CARE PANEL, VENOUS
BASE EXCESS VENOUS: 4.9 — ABNORMAL HIGH (ref -2.0–2.0)
CALCIUM IONIZED VENOUS (MG/DL): 4.79 mg/dL (ref 4.40–5.40)
GLUCOSE WHOLE BLOOD: 125 mg/dL (ref 70–179)
HCO3 VENOUS: 29 mmol/L — ABNORMAL HIGH (ref 22–27)
HEMOGLOBIN BLOOD GAS: 10.2 g/dL — ABNORMAL LOW
LACTATE BLOOD VENOUS: 1.8 mmol/L (ref 0.5–1.8)
O2 SATURATION VENOUS: 35.6 % — ABNORMAL LOW (ref 40.0–85.0)
PCO2 VENOUS: 47 mmHg (ref 40–60)
PH VENOUS: 7.41 (ref 7.32–7.43)
PO2 VENOUS: 24 mmHg — ABNORMAL LOW (ref 30–55)
POTASSIUM WHOLE BLOOD: 3.5 mmol/L (ref 3.4–4.6)
SODIUM WHOLE BLOOD: 137 mmol/L (ref 135–145)

## 2021-06-16 LAB — COMPREHENSIVE METABOLIC PANEL
ALBUMIN: 2.9 g/dL — ABNORMAL LOW (ref 3.4–5.0)
ALKALINE PHOSPHATASE: 78 U/L (ref 46–116)
ALT (SGPT): 10 U/L (ref 10–49)
ANION GAP: 7 mmol/L (ref 5–14)
AST (SGOT): 13 U/L (ref <=34)
BILIRUBIN TOTAL: 0.7 mg/dL (ref 0.3–1.2)
BLOOD UREA NITROGEN: 8 mg/dL — ABNORMAL LOW (ref 9–23)
BUN / CREAT RATIO: 9
CALCIUM: 8.9 mg/dL (ref 8.7–10.4)
CHLORIDE: 102 mmol/L (ref 98–107)
CO2: 28 mmol/L (ref 20.0–31.0)
CREATININE: 0.87 mg/dL
EGFR CKD-EPI (2021) MALE: 86 mL/min/{1.73_m2} (ref >=60)
GLUCOSE RANDOM: 121 mg/dL (ref 70–179)
POTASSIUM: 3.7 mmol/L (ref 3.5–5.1)
PROTEIN TOTAL: 6.9 g/dL (ref 5.7–8.2)
SODIUM: 137 mmol/L (ref 135–145)

## 2021-06-16 LAB — MANUAL DIFFERENTIAL
BASOPHILS - ABS (DIFF): 0 10*9/L (ref 0.0–0.1)
BASOPHILS - REL (DIFF): 0 %
EOSINOPHILS - ABS (DIFF): 0 10*9/L (ref 0.0–0.5)
EOSINOPHILS - REL (DIFF): 0 %
LYMPHOCYTES - ABS (DIFF): 0.7 10*9/L — ABNORMAL LOW (ref 1.1–3.6)
LYMPHOCYTES - REL (DIFF): 11 %
MONOCYTES - ABS (DIFF): 0.6 10*9/L (ref 0.3–0.8)
MONOCYTES - REL (DIFF): 9 %
NEUTROPHILS - ABS (DIFF): 5 10*9/L (ref 1.8–7.8)
NEUTROPHILS - REL (DIFF): 80 %

## 2021-06-16 LAB — CBC W/ AUTO DIFF
HEMATOCRIT: 30.4 % — ABNORMAL LOW (ref 39.0–48.0)
HEMOGLOBIN: 9.8 g/dL — ABNORMAL LOW (ref 12.9–16.5)
MEAN CORPUSCULAR HEMOGLOBIN CONC: 32.2 g/dL (ref 32.0–36.0)
MEAN CORPUSCULAR HEMOGLOBIN: 28.1 pg (ref 25.9–32.4)
MEAN CORPUSCULAR VOLUME: 87.1 fL (ref 77.6–95.7)
PLATELET COUNT: >252 10*9/L (ref 150–450)
RED BLOOD CELL COUNT: 3.49 10*12/L — ABNORMAL LOW (ref 4.26–5.60)
RED CELL DISTRIBUTION WIDTH: 25.2 % — ABNORMAL HIGH (ref 12.2–15.2)
WBC ADJUSTED: 6.2 10*9/L (ref 3.6–11.2)

## 2021-06-16 LAB — HIGH SENSITIVITY TROPONIN I - SINGLE: HIGH SENSITIVITY TROPONIN I: 9 ng/L (ref <=53)

## 2021-06-16 LAB — LACTATE SEPSIS, VENOUS: LACTATE BLOOD VENOUS: 1.8 mmol/L (ref 0.5–1.8)

## 2021-06-16 MED ORDER — ONDANSETRON HCL 4 MG PO TABS
8.0000 mg | ORAL_TABLET | Freq: Once | ORAL | Status: AC
  Administered 2021-06-16: 8 mg via ORAL
  Filled 2021-06-16: qty 2

## 2021-06-16 MED ORDER — AZACITIDINE CHEMO SQ INJECTION
75.0000 mg/m2 | Freq: Once | INTRAMUSCULAR | Status: AC
  Administered 2021-06-16: 200 mg via SUBCUTANEOUS
  Filled 2021-06-16: qty 8

## 2021-06-16 NOTE — Patient Instructions (Signed)
[facility] CANCER CTR AT [place]  Discharge Instructions: ?Thank you for choosing [facility] to provide your oncology and hematology care.  ?If you have a lab appointment with the [facility], please go directly to the [facility] and check in at the registration area. ? ?Wear comfortable clothing and clothing appropriate for easy access to any Portacath or PICC line.  ? ?We strive to give you quality time with your provider. You may need to reschedule your appointment if you arrive late (15 or more minutes).  Arriving late affects you and other patients whose appointments are after yours.  Also, if you miss three or more appointments without notifying the office, you may be dismissed from the clinic at the provider?s discretion.    ?  ?For prescription refill requests, have your pharmacy contact our office and allow 72 hours for refills to be completed.   ? ?Today you received the following chemotherapy and/or immunotherapy agents VIDAZA    ?  ?To help prevent nausea and vomiting after your treatment, we encourage you to take your nausea medication as directed. ? ?BELOW ARE SYMPTOMS THAT SHOULD BE REPORTED IMMEDIATELY: ?*FEVER GREATER THAN 100.4 F (38 ?C) OR HIGHER ?*CHILLS OR SWEATING ?*NAUSEA AND VOMITING THAT IS NOT CONTROLLED WITH YOUR NAUSEA MEDICATION ?*UNUSUAL SHORTNESS OF BREATH ?*UNUSUAL BRUISING OR BLEEDING ?*URINARY PROBLEMS (pain or burning when urinating, or frequent urination) ?*BOWEL PROBLEMS (unusual diarrhea, constipation, pain near the anus) ?TENDERNESS IN MOUTH AND THROAT WITH OR WITHOUT PRESENCE OF ULCERS (sore throat, sores in mouth, or a toothache) ?UNUSUAL RASH, SWELLING OR PAIN  ?UNUSUAL VAGINAL DISCHARGE OR ITCHING  ? ?Items with * indicate a potential emergency and should be followed up as soon as possible or go to the Emergency Department if any problems should occur. ? ?Please show the CHEMOTHERAPY ALERT CARD or IMMUNOTHERAPY ALERT CARD at check-in to the  Emergency Department and triage nurse. ? ?Should you have questions after your visit or need to cancel or reschedule your appointment, please contact [facility] CANCER [place] AT [place]  [phone] and follow the prompts.  Office hours are 8:00 a.m. to 4:30 p.m. Monday - Friday. Please note that voicemails left after 4:00 p.m. may not be returned until the following business day.  We are closed weekends and major holidays. You have access to a nurse at all times for urgent questions. Please call the main number to the clinic [phone] and follow the prompts. ? ?For any non-urgent questions, you may also contact your provider using MyChart. We now offer e-Visits for anyone 30 and older to request care online for non-urgent symptoms. For details visit [email or web address]. ?  ?Also download the MyChart app! Go to the app store, search "MyChart", open the app, select [place], and log in with your MyChart username and password. ? ?Due to Covid, a mask is required upon entering the hospital/clinic. If you do not have a mask, one will be given to you upon arrival. For doctor visits, patients may have 1 support person aged 40 or older with them. For treatment visits, patients cannot have anyone with them due to current Covid guidelines and our immunocompromised population.  ? ?Azacitidine suspension for injection (subcutaneous use) ?What is this medication? ?AZACITIDINE (ay [place]) is a chemotherapy drug. This medicine reduces the growth of cancer cells and can suppress the immune system. It is used for treating myelodysplastic syndrome or some types of leukemia. ?This medicine may be used for other purposes; ask  your health care provider or pharmacist if you have questions. ?COMMON BRAND NAME(S): Vidaza ?What should I tell my care team before I take this medication? ?They need to know if you have any of these conditions: ?kidney disease ?liver disease ?liver tumors ?an unusual or allergic  reaction to azacitidine, mannitol, other medicines, foods, dyes, or preservatives ?pregnant or trying to get pregnant ?breast-feeding ?How should I use this medication? ?This medicine is for injection under the skin. It is administered in a hospital or clinic by a specially trained health care professional. ?Talk to your pediatrician regarding the use of this medicine in children. While this drug may be prescribed for selected conditions, precautions do apply. ?Overdosage: If you think you have taken too much of this medicine contact a poison control center or emergency room at once. ?NOTE: This medicine is only for you. Do not share this medicine with others. ?What if I miss a dose? ?It is important not to miss your dose. Call your doctor or health care professional if you are unable to keep an appointment. ?What may interact with this medication? ?Interactions have not been studied. ?Give your health care provider a list of all the medicines, herbs, non-prescription drugs, or dietary supplements you use. Also tell them if you smoke, drink alcohol, or use illegal drugs. Some items may interact with your medicine. ?This list may not describe all possible interactions. Give your health care provider a list of all the medicines, herbs, non-prescription drugs, or dietary supplements you use. Also tell them if you smoke, drink alcohol, or use illegal drugs. Some items may interact with your medicine. ?What should I watch for while using this medication? ?Visit your doctor for checks on your progress. This drug may make you feel generally unwell. This is not uncommon, as chemotherapy can affect healthy cells as well as cancer cells. Report any side effects. Continue your course of treatment even though you feel ill unless your doctor tells you to stop. ?In some cases, you may be given additional medicines to help with side effects. Follow all directions for their use. ?Call your doctor or health care professional for  advice if you get a fever, chills or sore throat, or other symptoms of a cold or flu. Do not treat yourself. This drug decreases your body's ability to fight infections. Try to avoid being around people who are sick. ?This medicine may increase your risk to bruise or bleed. Call your doctor or health care professional if you notice any unusual bleeding. ?You may need blood work done while you are taking this medicine. ?Do not become pregnant while taking this medicine and for 6 months after the last dose. Women should inform their doctor if they wish to become pregnant or think they might be pregnant. Men should not father a child while taking this medicine and for 3 months after the last dose. There is a potential for serious side effects to an unborn child. Talk to your health care professional or pharmacist for more information. Do not breast-feed an infant while taking this medicine and for 1 week after the last dose. ?This medicine may interfere with the ability to have a child. Talk with your doctor or health care professional if you are concerned about your fertility. ?What side effects may I notice from receiving this medication? ?Side effects that you should report to your doctor or health care professional as soon as possible: ?allergic reactions like skin rash, itching or hives, swelling of the face, lips,  or tongue ?low blood counts - this medicine may decrease the number of white blood cells, red blood cells and platelets. You may be at increased risk for infections and bleeding. ?signs of infection - fever or chills, cough, sore throat, pain passing urine ?signs of decreased platelets or bleeding - bruising, pinpoint red spots on the skin, black, tarry stools, blood in the urine ?signs of decreased red blood cells - unusually weak or tired, fainting spells, lightheadedness ?signs and symptoms of kidney injury like trouble passing urine or change in the amount of urine ?signs and symptoms of liver  injury like dark yellow or brown urine; general ill feeling or flu-like symptoms; light-colored stools; loss of appetite; nausea; right upper belly pain; unusually weak or tired; yellowing of the eyes or skin ?Si

## 2021-06-17 ENCOUNTER — Ambulatory Visit
Admit: 2021-06-17 | Discharge: 2021-06-20 | Disposition: A | Discharge status: Home / Self Care | Payer: MEDICARE | Admitting: Hematology

## 2021-06-17 ENCOUNTER — Encounter
Admit: 2021-06-17 | Discharge: 2021-06-20 | Disposition: A | Discharge status: Home / Self Care | Payer: MEDICARE | Attending: Medical Oncology | Admitting: Hematology

## 2021-06-17 ENCOUNTER — Inpatient Hospital Stay: Payer: Medicare Other

## 2021-06-17 ENCOUNTER — Telehealth: Payer: Self-pay | Admitting: *Deleted

## 2021-06-17 LAB — CBC W/ AUTO DIFF
BASOPHILS ABSOLUTE COUNT: 0 10*9/L (ref 0.0–0.1)
BASOPHILS RELATIVE PERCENT: 0.3 %
EOSINOPHILS ABSOLUTE COUNT: 0 10*9/L (ref 0.0–0.5)
EOSINOPHILS RELATIVE PERCENT: 0.6 %
HEMATOCRIT: 29.5 % — ABNORMAL LOW (ref 39.0–48.0)
HEMOGLOBIN: 9.7 g/dL — ABNORMAL LOW (ref 12.9–16.5)
LYMPHOCYTES ABSOLUTE COUNT: 0.4 10*9/L — ABNORMAL LOW (ref 1.1–3.6)
LYMPHOCYTES RELATIVE PERCENT: 7.3 %
MEAN CORPUSCULAR HEMOGLOBIN CONC: 32.7 g/dL (ref 32.0–36.0)
MEAN CORPUSCULAR HEMOGLOBIN: 28.2 pg (ref 25.9–32.4)
MEAN CORPUSCULAR VOLUME: 86.1 fL (ref 77.6–95.7)
MEAN PLATELET VOLUME: 8.8 fL (ref 6.8–10.7)
MONOCYTES ABSOLUTE COUNT: 1.2 10*9/L — ABNORMAL HIGH (ref 0.3–0.8)
MONOCYTES RELATIVE PERCENT: 20 %
NEUTROPHILS ABSOLUTE COUNT: 4.2 10*9/L (ref 1.8–7.8)
NEUTROPHILS RELATIVE PERCENT: 71.8 %
PLATELET COUNT: 187 10*9/L (ref 150–450)
RED BLOOD CELL COUNT: 3.42 10*12/L — ABNORMAL LOW (ref 4.26–5.60)
RED CELL DISTRIBUTION WIDTH: 25 % — ABNORMAL HIGH (ref 12.2–15.2)
WBC ADJUSTED: 5.8 10*9/L (ref 3.6–11.2)

## 2021-06-17 LAB — URINALYSIS WITH MICROSCOPY WITH CULTURE REFLEX
BILIRUBIN UA: NEGATIVE
GLUCOSE UA: NEGATIVE
KETONES UA: NEGATIVE
LEUKOCYTE ESTERASE UA: NEGATIVE
NITRITE UA: NEGATIVE
PH UA: 6 (ref 5.0–9.0)
PROTEIN UA: 70 — AB
RBC UA: 8 /HPF — ABNORMAL HIGH (ref <=3)
SPECIFIC GRAVITY UA: 1.02 (ref 1.003–1.030)
SQUAMOUS EPITHELIAL: <1 /HPF (ref 0–5)
UROBILINOGEN UA: <2
WBC UA: 1 /HPF (ref <=2)

## 2021-06-17 LAB — BASIC METABOLIC PANEL
ANION GAP: 7 mmol/L (ref 5–14)
BLOOD UREA NITROGEN: 6 mg/dL — ABNORMAL LOW (ref 9–23)
BUN / CREAT RATIO: 8
CALCIUM: 8.5 mg/dL — ABNORMAL LOW (ref 8.7–10.4)
CHLORIDE: 101 mmol/L (ref 98–107)
CO2: 30 mmol/L (ref 20.0–31.0)
CREATININE: 0.79 mg/dL
EGFR CKD-EPI (2021) MALE: 89 mL/min/{1.73_m2} (ref >=60)
GLUCOSE RANDOM: 115 mg/dL (ref 70–179)
POTASSIUM: 3.6 mmol/L (ref 3.4–4.8)
SODIUM: 138 mmol/L (ref 135–145)

## 2021-06-17 LAB — SLIDE REVIEW

## 2021-06-17 LAB — MAGNESIUM: MAGNESIUM: 1.5 mg/dL — ABNORMAL LOW (ref 1.6–2.6)

## 2021-06-17 LAB — PHOSPHORUS: PHOSPHORUS: 2.3 mg/dL — ABNORMAL LOW (ref 2.4–5.1)

## 2021-06-17 LAB — LACTATE DEHYDROGENASE: LACTATE DEHYDROGENASE: 242 U/L (ref 120–246)

## 2021-06-17 LAB — URIC ACID
URIC ACID: 3.7 mg/dL
URIC ACID: 3.2 mg/dL — ABNORMAL LOW

## 2021-06-17 LAB — LACTATE SEPSIS, VENOUS: LACTATE BLOOD VENOUS: 1.5 mmol/L (ref 0.5–1.8)

## 2021-06-17 MED ORDER — AZACITIDINE CHEMO SQ INJECTION
75.0000 mg/m2 | Freq: Once | INTRAMUSCULAR | Status: AC
  Filled 2021-06-17: qty 8

## 2021-06-17 MED ADMIN — vancomycin (VANCOCIN) 2,500 mg in sodium chloride (NS) 0.9 % 500 mL IVPB: 2500 mg | INTRAVENOUS | @ 02:00:00 | Stop: 2021-06-17

## 2021-06-17 MED ADMIN — cefepime (MAXIPIME) 2 g in sodium chloride 0.9 % (NS) 100 mL IVPB-connector bag: 2 g | INTRAVENOUS | @ 10:00:00 | Stop: 2021-06-22

## 2021-06-17 MED ADMIN — allopurinol (ZYLOPRIM) tablet 300 mg: 300 mg | ORAL | @ 13:00:00

## 2021-06-17 MED ADMIN — lactated ringers bolus 2,000 mL: 2000 mL | INTRAVENOUS | @ 06:00:00 | Stop: 2021-06-17

## 2021-06-17 MED ADMIN — cefepime (MAXIPIME) 2 g in sodium chloride 0.9 % (NS) 100 mL IVPB-connector bag: 2 g | INTRAVENOUS | @ 17:00:00 | Stop: 2021-06-22

## 2021-06-17 MED ADMIN — magnesium sulfate 2gm/50mL IVPB: 2 g | INTRAVENOUS | @ 14:00:00 | Stop: 2021-06-17

## 2021-06-17 MED ADMIN — lactated ringers bolus 1,000 mL: 1000 mL | INTRAVENOUS | @ 04:00:00 | Stop: 2021-06-17

## 2021-06-17 MED ADMIN — valACYclovir (VALTREX) tablet 500 mg: 500 mg | ORAL | @ 13:00:00

## 2021-06-17 MED ADMIN — cefepime (MAXIPIME) 2 g in sodium chloride 0.9 % (NS) 100 mL IVPB-connector bag: 2 g | INTRAVENOUS | @ 03:00:00 | Stop: 2021-06-17

## 2021-06-17 MED ADMIN — enoxaparin (LOVENOX) syringe 40 mg: 40 mg | SUBCUTANEOUS | @ 13:00:00

## 2021-06-17 MED ADMIN — pantoprazole (PROTONIX) EC tablet 40 mg: 40 mg | ORAL | @ 13:00:00

## 2021-06-17 MED ADMIN — acetaminophen (TYLENOL) tablet 650 mg: 650 mg | ORAL | @ 02:00:00 | Stop: 2021-06-16

## 2021-06-17 MED ADMIN — acetaminophen (TYLENOL) tablet 650 mg: 650 mg | ORAL | @ 22:00:00

## 2021-06-17 MED ADMIN — lactated ringers bolus 1,000 mL: 1000 mL | INTRAVENOUS | @ 02:00:00 | Stop: 2021-06-17

## 2021-06-17 NOTE — Progress Notes (Unsigned)
Phone call to family because pt didn't show yet for 1030 appt.  They stated that he had a fever last night so went to [facility] and is staying the night for IV antibiotics.  ?

## 2021-06-17 NOTE — Telephone Encounter (Signed)
Pt did not come to get treatment today and daughter let us know that he was not coming due to pt had fever and they took him to [facility] ?

## 2021-06-17 NOTE — Unmapped (Signed)
Bed: 11-A  Expected date:   Expected time:   Means of arrival:   Comments:  EMS

## 2021-06-17 NOTE — Unmapped (Signed)
Adult Nutrition Assessment Note    Visit Type: MD Consult  Reason for Visit: Assessment (Nutrition)      HPI & PMH:  Gary Baird is a 81 y.o. 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   ??    Anthropometric Data:  Height: 186.2 cm (6' 1.31)   Admission weight: (!) 134.1 kg (295 lb 10.2 oz)  Last recorded weight: (!) 134.1 kg (295 lb 10.2 oz)  IBW: 84.35 kg  Percent IBW: 158.98 %  BMI: Body mass index is 38.68 kg/m??.   Usual Body Weight: Per patient's self report 350 lb.    Weight history prior to admission: Per patient's self report, patient used to weight 350 lb prior AML diagnosis (3 years ago) and lost weight since then. However, patient has consistently been gaining weight this year. Per chart review, patient has a 10lb/3.3% weight loss in three months.   Wt Readings from Last 10 Encounters:   06/17/21 (!) 134.1 kg (295 lb 10.2 oz)   05/03/21 (!) 134.1 kg (295 lb 9.6 oz)   04/26/21 (!) 130.3 kg (287 lb 4.8 oz)   04/13/21 (!) 135.6 kg (299 lb)   03/18/21 (!) 137 kg (302 lb 0.5 oz)   03/03/21 (!) 140 kg (308 lb 9.6 oz)   02/21/21 (!) 142.9 kg (315 lb)   02/17/21 (!) 142.9 kg (315 lb 0.6 oz)   02/15/21 (!) 142.9 kg (315 lb 0.6 oz)   02/14/21 (!) 142 kg (313 lb)        Weight changes this admission:   Last 5 Recorded Weights    06/17/21 1121   Weight: (!) 134.1 kg (295 lb 10.2 oz)        Nutrition Focused Physical Exam:  Nutrition Focused Physical Exam:  Fat Areas Examined  Orbital: Moderate loss  Upper Arm: Moderate loss      Muscle Areas Examined  Temple: Mild loss  Clavicle: Mild loss  Acromion: Mild loss  Dorsal Hand: Moderate loss  Patellar: No loss  Anterior Thigh: No loss  Posterior Calf: No loss              Nutrition Evaluation  Overall Impressions: Moderate fat loss;Mild muscle loss (06/17/21 1309)  Nutrition Designation: (P) Obese class II  (BMI 35.00 - 39.99 kg/m2) (06/17/21 1120)      NUTRITIONALLY RELEVANT DATA     Medications:   Nutritionally pertinent medications reviewed and evaluated for potential food and/or medication interactions.  Include Protonix , Allopurinol, Magnesium Sulfate, Cefepime, Imodium prn.     Labs:   Nutritionally pertinent labs reviewed.  Mg 1.5 mg/dL, Phosphorus 2.3mg /dL.     Nutrition History:   June 17, 2021: Prior to admission: Conducted nutrition consult at patient's bedside. Patient states that his appetite is usually good, having a PO intake of 2-3 meals, as well as a cup of carrot juice every day. However, patient has had poor PO intake since Saturday 04/08 as he was presenting hypogeusia symptoms. Patient was relying mainly on snacks and small meals for the past 6 days as he could not taste his food, affecting his appetite. Patient endorses post prandial nausea since chemotherapy cycles started, but he states they usually subside after a couple of minutes after eating. Since time of admission, patient's appetite has improved as he states his taste is coming  back. Patient asked dietetic student to order oatmeal, sugar, butter, and coffee for him to the kitchen. Patient also ate a cup of peaches in the am and reported they tasted good. Patient denies any N/V/D/C, but requested a bucket in case he endorsed postprandial N/V later. Patient was encourage to increaese his PO and fluid intake and to report any taste changes if any when taste completely came back. Provided patient with advise about how to manage taste changes if his taste comes back with alterations. Pt was agreeable with information and did not have any further questions at this time.          Allergies, Intolerances, Sensitivities, and/or Cultural/Religious Dietary Restrictions: none identified at this time     Current Nutrition:  Oral intake          Nutritional Needs:   Daily Estimated Nutrient Needs:  Energy: 2436-2754Kcal/day kcals Per [place] St-Jeor Equation using  , 134.4 kg (06/17/21 1121)]  Protein: 101-127gm/day gm [1.2-1.5 gm/kg using admission body weight, 84.35 kg (06/17/21 1121)]  Carbohydrate:   [no restriction]  Fluid: 2436-2762mL/day mL [1 mL/kcal (maintenance)]      Malnutrition Assessment using AND/ASPEN Clinical Characteristics:    Non-severe (Moderate) Protein-Calorie Malnutrition in the context of acute illness or injury (06/17/21 1129)  Energy Intake: < or equal to 50% of estimated energy requirement for > or equal to 5 days  Fat Loss: Moderate  Muscle Loss: Mild  Malnutrition Score: 3      GOALS and EVALUATION     ??? Patient to consume 70% or greater of po intake via combination of meals, snacks, and/or oral supplements within 7 days.  - New    Motivation, Barriers, and Compliance:  Evaluation of motivation, barriers, and compliance completed. No concerns identified at this time.     NUTRITION ASSESSMENT     ??? Current nutrition therapy is appropriate and progressing toward meeting meeting nutritional needs at this time.    ??? Patient currently motivated to eat again as his taste is coming back. Patient had appetite at the time of nutrition consult and asked for help to order oatmeal and coffee for breakfast.   ??? Mg blood levels 1.5mg /dL. Currently taking Mag-ox for repletion.       Discharge Planning:   Monitor for potential discharge needs with multi-disciplinary team.       NUTRITION INTERVENTIONS and RECOMMENDATION     1. Continue with current diet: Regular/House  2. Provided patient with advise about how to manage taste changes if his taste comes back with alterations, as well as provided handout with these recommendations.  3. Continue to monitor taste changes   4. Recommend taking regular weights to monitor trends  5. Continue monitoring PO intake and % eaten in Epic     Follow-Up Parameters:   1-2 times per week (and more frequent as indicated)    Lenise Herald. Matheu Ploeger   Dietetic Intern

## 2021-06-17 NOTE — Unmapped (Signed)
Regarding: Fever, Not eating  ----- Message from Gevena Cotton sent at 06/16/2021  6:02 PM EDT -----  If you had not called [facility], what do you think you would have done?   Go to Emergency Dept

## 2021-06-17 NOTE — Unmapped (Signed)
Recent:   What is the date of your last related visit?  4/13  Related acute medications Rx'd:  chemo  Home treatment tried:  n/a      Relevant:   Allergies: Patient has no known allergies.  Medications: n/a   Health History: Leukemia  Weight: n/a      [facility] patients only:  What was the date of your last cancer treatment (mm/dd/yy)?: 06/16/21  Was the treatment oral or infusion?: injection  Are you currently on TVEC (yes/no)?: n/a  Answer Assessment - Initial Assessment Questions  1. COVID-19 DIAGNOSIS: Who made your COVID-19 diagnosis? Was it confirmed by a positive lab test or self-test? If not diagnosed by a doctor (or NP/PA), ask Are there lots of cases (community spread) where you live? Note: See public health department website, if unsure.      Not tested or diagnosed, but symptoms are present  2. COVID-19 EXPOSURE: Was there any known exposure to COVID before the symptoms began? CDC Definition of close contact: within 6 feet (2 meters) for a total of 15 minutes or more over a 24-hour period.       Not known   3. ONSET: When did the COVID-19 symptoms start?       4 days ago  4. WORST SYMPTOM: What is your worst symptom? (e.g., cough, fever, shortness of breath, muscle aches)      Diarrhea, no appetite. Has not eaten in 4 days   5. COUGH: Do you have a cough? If Yes, ask: How bad is the cough?        denies  6. FEVER: Do you have a fever? If Yes, ask: What is your temperature, how was it measured, and when did it start?      Yes 101 forehead  7. RESPIRATORY STATUS: Describe your breathing? (e.g., shortness of breath, wheezing, unable to speak)       normal  8. BETTER-SAME-WORSE: Are you getting better, staying the same or getting worse compared to yesterday?  If getting worse, ask, In what way?      Worse,. Fever today  9. HIGH RISK DISEASE: Do you have any chronic medical problems? (e.g., asthma, heart or lung disease, weak immune system, obesity, etc.)      Yes Cancer  10. VACCINE: Have you had the COVID-19 vaccine? If Yes, ask: Which one, how many shots, when did you get it?        N/a- chemo would have wiped out  11. BOOSTER: Have you received your COVID-19 booster? If Yes, ask: Which one and when did you get it?        N/a chemo   12. PREGNANCY: Is there any chance you are pregnant? When was your last menstrual period?        N/a  13. OTHER SYMPTOMS: Do you have any other symptoms?  (e.g., chills, fatigue, headache, loss of smell or taste, muscle pain, sore throat)        Taste is off,  nausea  14. O2 SATURATION MONITOR:  Do you use an oxygen saturation monitor (pulse oximeter) at home? If Yes, ask What is your reading (oxygen level) today? What is your usual oxygen saturation reading? (e.g., 95%)        N/a    Protocols used: Coronavirus (COVID-19) Diagnosed or Suspected-A-AH

## 2021-06-17 NOTE — Unmapped (Signed)
Vancomycin Therapeutic Monitoring Pharmacy Note    Gary Baird is a 81 y.o. male continuing vancomycin. Date of therapy initiation: 06/16/21    Indication: Bacteremia/Sepsis    Prior Dosing Information:  2.5 g vanc x1 in ED      Goals:  Therapeutic Drug Levels  Vancomycin trough goal: 10-15 mg/L    Additional Clinical Monitoring/Outcomes  Renal function, volume status (intake and output)    Results: Not applicable    Wt Readings from Last 1 Encounters:   05/03/21 (!) 134.1 kg (295 lb 9.6 oz)     Creatinine   Date Value Ref Range Status   06/16/2021 0.87 0.60 - 1.10 mg/dL Final   16/12/9602 5.40 (L) 0.60 - 1.10 mg/dL Final   98/01/9146 8.29 0.60 - 1.10 mg/dL Final        Pharmacokinetic Considerations and Significant Drug Interactions:  Adult (estimated initial): Vd = 95 L, ke = 0.06 hr-1  Concurrent nephrotoxic meds: not applicable    Assessment/Plan:  Recommendation(s)  Start vancomycin 1250mg  q12hr  Estimated trough on recommended regimen:  13 mg/L    Follow-up  Level due: prior to fourth or fifth dose  A pharmacist will continue to monitor and order levels as appropriate    Please page service pharmacist with questions/clarifications.    Cephus Slater, PharmD

## 2021-06-17 NOTE — Unmapped (Addendum)
Outpatient follow up:  [ ]  He should have follow up within the week to check on his diarrheal frequency and assess need for more than 10 days of fidaxomicin    [ ]  BMP/Mg/Phos should be checked within the next week to assess whether he needs continued potassium repletion (discharged on 20 meq daily for 7 days)    [ ]  In the event that he is placed on Levaquin for prophylaxis in the future, would consider PO vancomycin for C diff ppx    [ ]  Patient denied taking hydrochlorothiazide or Lasix recently, and these remained discontinued at discharge, since he remained normotensive.     ----------------  Hospital course:  Gary Baird is a 81 y.o. 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 Diff.     On arrival to ER, he was tachycardic to 120s with temp to 38.1. He had normal ANC (>3) and unremarkable CXR and UA. He was given fluid resuscitation and started on broad spectrum antibiotics (vanc and cefepime) initially.     On further discussion, he endorsed several weeks of watery diarrhea, and he tested positive for C. Diff. He was started on fidaxomicin. Broad spectrum antibiotics were stopped due to no growth on initial blood cultures and no other evidence of infection.    By day of discharge, his symptoms were improved and he had been afebrile for >48 hours. He denied abdominal pain, was tolerating PO, and was able to ambulate without issue. PT/OT evaluated with no needs identified. He will be discharged home with plan to complete total of 10 day course of PO fidaxomicin.      Other hospital problems addressed as below:    Hypokalemia: required repletion inpatient and will be discharged on PO Kcl 20 meq daily with recommendation for repeat BMP/Mg/Phos within the week.  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 Follows with [name] in [place]. Given his clinical stability, no changes made to this regimen. Also continued allopurinol and Valtrex. He is not currently on outpatient Levaquin, but would recommend considering PO vanc in future for C. Diff ppx if he is placed on prophylactic antibiotics.   Code status: he stated he would not want to have prolonged suffering and opted for DNR/DNI status on admission.  HTN: his daughter states that he no longer takes hydrochlorothiazide or Lasix outpatient. He was normotensive during admission without these meds, no changes made.    GERD: continued PPI  Recent cataract surgery: no changes made to his home eye drops

## 2021-06-17 NOTE — Unmapped (Signed)
Hematology Resident (MEDE) History & Physical    Assessment & Plan:   Gary Baird is a 81 y.o. 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 *        Fever, Dehydration, Sepsis: Initial vitals with fever to 38.1, sinus tachycardia 124, blood pressure low normal 109/59; no localizing symptoms and patient overall feeling much improved after fluids given. Lactate 1.8 and recheck post fluids 1.5, reassuring. Will complete full sepsis bolus by giving additional 2L of LR for total of 4L.Triplex negative, will send full RPP but no viral symptoms. No other localizing symptoms, CXR without infiltrate, UA without evidence of infection, no central line, no vomiting/diarrhea. ANC is currently 5.0 (previously has been neutropenic but currently counts have recovered). Also fever listed as possible side effect of azacitidine, will discuss with pharmacy in the AM.    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 in [place], see HPI for full onc hx.  -discuss in AM the safety of continuing azacitidine/venetoclax in setting of fevers   -daily CBC w/ diff, Chem10, TLS labs  -allopurinol daily  -Valtrex ppx    Chronic Problems  HTN: hold home hydrochlorothiazide 12.5 daily, lasix 20 daily while evaluating for infection as above  GERD: pantoprazole 40 mg BID    Checklist:  Diet: Regular Diet  DVT PPx: Lovenox 40mg  q24h  Code Status: DNR and DNI; confirmed with patient on admission  Dispo: Patient appropriate for  ??Inpatient based on expectation of ongoing need for hospitalization greater than two midnights and severity of presentation/services including monitoring of blood cultures while providing IV antibiotics.     Chief Concern:   Sepsis (CMS-HCC)    Subjective:   Gary Baird is a 81 y.o. 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     History obtained from patient.     HPI:    Onc history: hx of CMML diagnosed in 2020 and later transformed to AML ~01/2021, initially followed by Dr. Vertell Limber as outpatient starting on venetoclax plus azacitadine 02/14/2021, bone marrow biopsy after cycle 1 showed morphologic remission. See further below for complete history.     Admitted 2/20-2/23 for neutropenic fever, ANC increased up to 0.5 and transitioned to augmentin x7 more days. Discharged and has been receiving cancer therapy with regimen of aza/ventaoclax with outside oncologist [name] at [facility] in [place], [place]. Counts have now recovered with ANC of 5.0, WBC 6.2, Hgb 9.8, Plt >252.    Last office visit with [name] just a few days ago on 06/13/21 to start C3 of azacitidine and this was provided, he also started taking venetoclax that day 400 mg daily.      Patient reports that since  this office visit has been feeling low appetite and somewhat tired. Went into infusion each day following this for aza injections and on 4/13 AM after he went home from injection, noted fever. No other localizing symptoms other than poor appetite, denied sore throat, congestion, HA, chest pain, SOB, cough, abd pain, nausea/vomiting, has loose stools 2/day but not changed from baseline, no rash or swelling. No other abnormal symptoms to report.   Has been drinking liquids but low appetite for food, food not tasting very good this past week.     In ED, febrile to 38.1, HR 124, BP 109/59, 95% SpO2 on room air, RR 23.   ANC of 5.0, WBC 6.2, Hgb 9.8, Plt >252.  CMP normal other than alb of 2.9  VBG normal with lactate 1.8, repeat 1.5 post fluids.   EKG sinus tachycardia, CXR without focal consolidation.   Covid/flu/RSV negative   Given vanc/cefepime and 1L LR bolus x2.       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)     Presentation: Incidentally found to have elevated WBC prior to procedure     Labs- WBC = 25.2  Hb = 8.2 g/dL  Platelets = 161  09% monocytes     Bone marrow biopsy- 09/04/2018  Hypercellular marrow- 70-90% cellular, 7% blasts by manual aspirate differential- monocytic appearance, granulocytic dysplasia, dysplasia of megakaryocytes            Final Diagnosis   Date Value Ref Range Status   09/04/2018     Final     (Outside Case #:  [id number], dated 09/04/2018)  Bone marrow,  aspiration and biopsy  -  Hypercellular bone marrow (90%) involved by chronic myelomonocytic leukemia-1 (9% blasts by manual aspirate differential) (see Comment)  -  Unifocal paratrabecular cluster of atypical CD17-positive cells (see Comment)  -  By outside report, cytogenetic analysis reveals a normal karyotype.  -  By outside report, next generation sequencing for myeloid disorders reveals pathogenic or likely pathogenic variants in CBL (p.C384Y), SRSF2 (p.P95H), TET2 (J.Y7829FAO*13), and SH2B3 (Y.Q657QIO*96).     Peripheral blood, smear review  -  Leukocytosis with absolute monocytosis  -  Normocytic anemia  -  Thrombocytosis     This electronic signature is attestation that the pathologist personally reviewed the submitted material(s) and the final diagnosis reflects that evaluation.            Flow cytometry: No myeloblasts detectable, 16% monocytes- HLA-DR, CD11b, CD13, CD14, CD33, CD38, CD56, CD64 and without CD34 expression      Cytogenetics = Normal, 46,XY     Mutational Panel:   CBL C384Y- 19.0% VAF  SH2B3 S256Qfs- 58.8% VAF  SRSF2 P95H- 47.1% VAF  TET2 R1440- 86% VAF     Treatment: Azacitidine 75mg /m2 daily for 5 days monthly (09/30/18--12/31/20)           2. Diagnosis: Secondary AML      Developed hematochezia, fatigue, blurry vision, DOE at end of Nov 2022.  WBC 136, Hb 5.4, Pl 313, peripheral blasts 8%               Diagnosis   Date Value Ref Range Status   01/31/2021     Final     Bone marrow, right iliac, aspiration and biopsy  -  Hypercellular bone marrow (90%) consistent with involvement by acute myeloid leukemia (53% blasts by manual aspirate differential)  -  See linked reports for associated Ancillary  Studies.       This electronic signature is attestation that the pathologist personally reviewed the submitted material(s) and the final diagnosis reflects that evaluation.                   RESULTS   Date Value Ref Range Status   01/31/2021     Preliminary     Normal Karyotype: 46,XY[10]     Normal FISH:  An interphase FISH assay shows no evidence of a rearrangement involving the KMT2A (MLL) gene region in the 200 nuclei scored.               Myeloid Panel:         Variants of Known/Likely Clinical Significance:   Gene Coding Predicted Protein Variant allele fraction   ASXL1 c.2158del p.(Asp720Thrfs*5) 42.7 %   SRSF2 c.284C>A p.(Pro95His) 45.5 %   TET2 c.4317dupA p.(Arg1440Thrfs*38) 90.4 %      Cycle 1: 02/14/2021  Azacitidine 75mg /m2 x 7 days  Venetoclax 400mg  x 28 days     BMBx 03/08/21   Bone marrow, right iliac, aspiration and biopsy  -  Limited bone marrow sampling with increased megakaryocytes and less than 1% blasts on touch prep differential count (see Comment)   -  See linked reports for associated Ancillary Studies.     Cycle 2: 03/21/2021  Azacitidine 75mg /m2 x 7 days   Venetoclax 400mg  x 21 days     Cycle 3: 04/18/2021 (tentative start)         CMML (chronic myelomonocytic leukemia) (CMS-HCC)    09/19/2018 Initial Diagnosis      CMML (chronic myelomonocytic leukemia) (CMS-HCC)       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)    02/04/2021 Initial Diagnosis      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)       02/14/2021 -  Chemotherapy      OP AML AZACITIDINE + VENETOCLAX  azacitidine 75 mg/m2 SQ on days 1-7, venetoclax ramp up Week 1 (dose dependent), then azacitidine 75 mg/m2 SQ on Days 1-7 every 28 days         Chemotherapy      OP AML AZACITIDINE + VENETOCLAX        azacitidine 75 mg/m2 SQ on days 1-7, venetoclax ramp up Week 1 (dose dependent), then azacitidine 75 mg/m2 SQ on Days 1-7 every 28 days             Designated Environmental health practitioner:  Mr. Quy Lotts currently has decisional capacity for healthcare decision-making and is able to designate a surrogate healthcare decision maker. Mr. Sultana Jr's designated healthcare decision maker(s) is/are daughters Victorino Dike and Manfred Laspina (the patient's adult child) as denoted by stated patient preference.    Objective:   Physical Exam:  Temp:  [37.3 ??C (99.1 ??F)-38.1 ??C (100.6 ??F)] 37.3 ??C (99.1 ??F)  Heart Rate:  [124-129] 127  SpO2 Pulse:  [124-128] 128  Resp:  [17-23] 23  BP: (99-127)/(56-83) 106/76  SpO2:  [95 %-99 %] 95 %    Gen: NAD, converses   Eyes: Sclera anicteric, EOMI grossly normal   HENT: atraumatic, normocephalic  Mouth: dry mucous membranes  Neck: trachea midline  Heart: tachycardic but regular rate, no murmur  Lungs: CTAB, no crackles or wheezes  Abdomen: soft, NTND  Extremities: bilateral LE edema mild  Neuro: grossly symmetric   Skin:  No rashes, lesions on clothed exam  Psych: Alert, oriented

## 2021-06-17 NOTE — Unmapped (Signed)
Pt active chemo treatment for Lukemia and reporting fevers at home. Told to come in by MD

## 2021-06-17 NOTE — Unmapped (Addendum)
[facility]  Emergency Department Provider Note     ED Clinical Impression     Final diagnoses:   Sepsis, due to unspecified organism, unspecified whether acute organ dysfunction present (CMS-HCC) (Primary)        Impression, Medical Decision Making, ED Course     Impression: 81 y.o. male who has a past medical history of Cancer (CMS-HCC), Hypertension, and Leukemia (CMS-HCC). 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  Patient has otherwise been in his normal state of health denies any sick contacts.    Initial vitals notable for fever of 100.6 ??F, tachycardic to 129, and hypotensive to 99/56.  Patient is saturating well on room air.  Lungs are clear to auscultation bilaterally.  Abdomen soft, nontender, nondistended.  Patient has 1+ BLE pitting edema.    DDx/MDM: Given patient's history concern for neutropenic fever.  Patient's symptoms and vitals are concerning for sepsis so we will plan to obtain septic work-up including blood cultures, CBC, lactate, CMP, UA to evaluate for anemia, neutropenic fever, electrolyte abnormalities, kidney function, and UTI.  At this time, unsure what the source of his fever is.  However, we will plan to get chest x-ray to look for any signs of pneumonia, pneumothorax, pleural effusions, and pulmonary edema.  Also concern for possible viral illness including COVID-19, influenza, and RSV so we will get swabs.  We will also obtain EKG and troponin to look for any signs of ACS or arrhythmias.  Do not suspect meningitis at this time given that patient has no nuchal rigidity.  Do not suspect intra-abdominal pathology at this time given benign abdominal exam so do not feel he needs CT abdomen pelvis.    Diagnostic workup as below. Will treat patient with broad-spectrum antibiotics including cefepime and vancomycin.  Given the patient has some bilateral pitting edema, will plan to start with a liter of IV fluids instead of the normal 30 cc/kg sepsis bolus.  We will also give Tylenol for his fever.    Orders Placed This Encounter   Procedures   ??? Blood Culture   ??? Blood Culture   ??? Rapid Influenza / RSV / COVID PCR   ??? XR Chest 2 views   ??? CBC w/ Differential   ??? Comprehensive Metabolic Panel   ??? Blood Gas Critical Care Panel, Venous   ??? Lactate Sepsis, Venous   ??? Urinalysis with Microscopy with Culture Reflex   ??? hsTroponin I (single, no delta)   ??? PLATELET ESTIMATE   ??? Misc nursing order (Sepsis Timer)   ??? Cardiac Monitor   ??? Oxygen sat continuous monitoring   ??? Notify Provider   ??? In and Out (I & O) cath   ??? Notify Provider   ??? Obtain medical records   ??? Vital signs   ??? Notify pharmacy immediately that the ED Adult Sepsis order set has been initiated.   ??? Adult Oxygen therapy   ??? ECG 12 Lead   ??? Insert peripheral IV   ??? Insert 2nd peripheral IV       ED Course as of 06/16/21 2300   Thu Jun 16, 2021   2151 Lactate, Venous: 1.8  Initial lactate within normal limits.   2235 CMP with normal electrolytes, LFTs, bilirubin, and kidney function.   2238 COVID-19, influenza, and RSV are negative.   2250 hsTroponin I: 9  Initial troponin negative.   2255 Patient signed out to  incoming team pending chest x-ray.  Patient will need to be admitted given that he is immunocompromise on chemotherapy with leukemia who is septic.   2259 CBC with normal WBC, stable hemoglobin 9.8, elevated platelets.  Absolute neutrophil count of 5.  Does not appear to be neutropenic and fever at this time.   2300 MAO paged for admission   2300 Patient signed out to incoming provider.       Independent Interpretation of Studies: I have independently interpreted the following studies:  ??? EKG independently reviewed and shows sinus tachycardia with a rate of 126 bpm.  Normal intervals.  Normal axis.  No significant ST elevations or depressions.  No obvious signs of acute ischemia or arrhythmia at this time.    ____________________________________________    The case was discussed with the attending physician, who is in agreement with the above assessment and plan.      History     Chief Complaint  Chief Complaint   Patient presents with   ??? Fever Between 33 Weeks and 81 Years Old       HPI   Gary Baird is a 81 y.o. male with past medical history as below including leukemia on active chemo who presents with fever.  Patient states he had his chemoinfusion today and afterwards developed a fever and was told to come into the ED for further evaluation..  Patient otherwise has feeling well.  He states for the past 4 days he has had some decreased taste.  Denies any nausea, vomiting, cough, sore throat, shortness of breath, chest pain, abdominal pain, rash, urinary symptoms, or diarrhea.  Also denies any other sick contacts.  Of note, patient was recently discharged on 04/28/2021 for neutropenic fever.    Outside Historian(s): I have obtained additional history/collateral from wife.    External Records Reviewed: I have reviewed discharge summary from 04/28/2021 patient was treated for neutropenic fever.    Past Medical History:   Diagnosis Date   ??? Cancer (CMS-HCC)    ??? Hypertension    ??? Leukemia (CMS-HCC)        Past Surgical History:   Procedure Laterality Date   ??? HERNIA REPAIR  1975   ??? PR COLONOSCOPY FLX DX W/COLLJ SPEC WHEN PFRMD N/A 02/02/2021    Procedure: COLONOSCOPY, FLEXIBLE, PROXIMAL TO SPLENIC FLEXURE; DIAGNOSTIC, W/WO COLLECTION SPECIMEN BY BRUSH OR WASH;  Surgeon: Mayford Knife, MD;  Location: [facility];  Service: Gastroenterology   ??? PR ENDOSCOPY UPPER SMALL INTESTINE N/A 03/17/2021    Procedure: SMALL INTESTINAL ENDOSCOPY, ENTEROSCOPY BEYOND SECOND PORTION OF DUODENUM, NOT INCL ILEUM; DX, INCL COLLECTION OF SPECIMEN(S) BY BRUSHING OR WASHING, WHEN PERFORMED;  Surgeon: Vonda Antigua, MD;  Location: [facility];  Service: Gastroenterology   ??? PR GASTROINTESTINAL TRACT IMAGING ESOPHAGUS W/I&R N/A 02/02/2021    Procedure: GI PATENCY TRACT IMAGING, INTRALUMINAL (EG, CAPSULE ENDOSCOPY), ESOPHAGUS W/PHYSICIAN INTERP/REPORT;  Surgeon: Mayford Knife, MD;  Location: [facility];  Service: Gastroenterology   ??? PR GI IMAG INTRALUMINAL ESOPHAGUS-ILEUM W/I&R N/A 03/15/2021    Procedure: GI VIDEO TRACT IMAGE INTRALUMINAL (EG, CAPSULE ENDOSCOPY), ESOPHAGUS VIA ILEUM, PHYSICIAN INTERPRETATION & REPORT;  Surgeon: Monte Fantasia, MD;  Location: [facility];  Service: Gastroenterology   ??? PR UPPER GI ENDOSCOPY,DIAGNOSIS N/A 02/02/2021    Procedure: UGI ENDO, INCLUDE ESOPHAGUS, STOMACH, & DUODENUM &/OR JEJUNUM; DX W/WO COLLECTION SPECIMN, BY BRUSH OR WASH;  Surgeon: Mayford Knife, MD;  Location:  [facility];  Service: Gastroenterology   ??? SKIN BIOPSY           Current Facility-Administered Medications:   ???  cefepime (MAXIPIME) 2 g in sodium chloride 0.9 % (NS) 100 mL IVPB-connector bag, 2 g, Intravenous, Once, Francis Dowse, MD, Last Rate: 200 mL/hr at 06/16/21 2247, 2 g at 06/16/21 2247  ???  lactated ringers bolus 1,000 mL, 1,000 mL, Intravenous, Once, Francis Dowse, MD, 1,000 mL at 06/16/21 2219  ???  vancomycin (VANCOCIN) 2,500 mg in sodium chloride (NS) 0.9 % 500 mL IVPB, 2,500 mg, Intravenous, Once, Francis Dowse, MD, Last Rate: 226 mL/hr at 06/16/21 2218, 2,500 mg at 06/16/21 2218    Current Outpatient Medications:   ???  allopurinol (ZYLOPRIM) 300 MG tablet, Take 1 tablet (300 mg total) by mouth daily., Disp: 30 tablet, Rfl: 2  ???  furosemide (LASIX) 20 MG tablet, Take 20 mg by mouth Two (2) times a day., Disp: , Rfl:   ???  melatonin 10 mg cap, Take 10 mg by mouth nightly., Disp: , Rfl:   ???  pantoprazole (PROTONIX) 40 MG tablet, Take 1 tablet (40 mg total) by mouth Two (2) times a day., Disp: 60 tablet, Rfl: 0  ???  prochlorperazine (COMPAZINE) 10 MG tablet, Take 1 tablet (10 mg total) by mouth every six (6) hours as needed for nausea., Disp: 30 tablet, Rfl: 2  ???  traZODone (DESYREL) 50 MG tablet, Take 50 mg by mouth nightly., Disp: , Rfl:   ???  valACYclovir (VALTREX) 500 MG tablet, Take 500 mg by mouth daily., Disp: , Rfl:   ???  venetoclax (VENCLEXTA) 100 mg tablet, Take 4 tablets (400 mg total) by mouth daily for 21 days of each cycle., Disp: 84 tablet, Rfl: 5    Allergies  Patient has no known allergies.    Family History  Family History   Problem Relation Age of Onset   ??? Alzheimer's disease Father    ??? Melanoma Neg Hx    ??? Basal cell carcinoma Neg Hx    ??? Squamous cell carcinoma Neg Hx        Social History  Social History     Tobacco Use   ??? Smoking status: Never   ??? Smokeless tobacco: Never   Vaping Use   ??? Vaping Use: Never used   Substance Use Topics   ??? Alcohol use: Not Currently   ??? Drug use: Never        Physical Exam     VITAL SIGNS:      Vitals:    06/16/21 2012 06/16/21 2024   BP:  99/56   Pulse: 129    Resp:  17   Temp:  (!) 38.1 ??C (100.6 ??F)   TempSrc:  Oral   SpO2: 99%        Constitutional: Alert and oriented. No acute distress.  Chronically ill-appearing.  Eyes: Conjunctivae are normal.  HEENT: Normocephalic and atraumatic. Conjunctivae clear. No congestion. Moist mucous membranes.   Cardiovascular: Rate as above, regular rhythm. Normal and symmetric distal pulses. Brisk capillary refill. Normal skin turgor.  Respiratory: Normal respiratory effort. Breath sounds are normal. There are no wheezing or crackles heard.  Gastrointestinal: Soft, non-distended, non-tender.  Genitourinary: Deferred.  Musculoskeletal: Non-tender with normal range of motion in all extremities.  1+ pitting BLE edema  Neurologic: Normal speech and language. No gross focal neurologic deficits are appreciated. Patient is moving all extremities equally, face is symmetric at rest and with speech.  Skin: Skin is warm, dry and intact. No rash noted.  Psychiatric: Mood and affect are normal. Speech and behavior are normal.     Radiology     XR Chest 2 views    (Results Pending)       Pertinent labs & imaging results that were available during my care of the patient were independently interpreted by me and considered in my medical decision making (see chart for details).    Portions of this record have been created using Scientist, clinical (histocompatibility and immunogenetics). Dictation errors have been sought, but may not have been identified and corrected.         Francis Dowse, MD  Resident  06/16/21 1610       Francis Dowse, MD  Resident  06/16/21 2300

## 2021-06-17 NOTE — Unmapped (Signed)
ED Progress Note    ED Course as of 06/17/21 0540   Thu Jun 16, 2021   2314 Received signout from previous resident, in short, 81 year old male with leukemia and actively undergoing chemotherapy.  Had session today and presented with fever.  Vancomycin and cefepime, giving second liter of fluids now.   Fri Jun 17, 2021   0400 Patient transferred to 70-D

## 2021-06-18 LAB — BASIC METABOLIC PANEL
ANION GAP: 8 mmol/L (ref 5–14)
BLOOD UREA NITROGEN: <5 mg/dL — ABNORMAL LOW (ref 9–23)
CALCIUM: 8.3 mg/dL — ABNORMAL LOW (ref 8.7–10.4)
CHLORIDE: 102 mmol/L (ref 98–107)
CO2: 28 mmol/L (ref 20.0–31.0)
CREATININE: 0.69 mg/dL
EGFR CKD-EPI (2021) MALE: >90 mL/min/{1.73_m2} (ref >=60)
GLUCOSE RANDOM: 118 mg/dL (ref 70–179)
POTASSIUM: 3 mmol/L — ABNORMAL LOW (ref 3.4–4.8)
SODIUM: 138 mmol/L (ref 135–145)

## 2021-06-18 LAB — CBC W/ AUTO DIFF
BASOPHILS ABSOLUTE COUNT: 0 10*9/L (ref 0.0–0.1)
BASOPHILS RELATIVE PERCENT: 0.4 %
EOSINOPHILS ABSOLUTE COUNT: 0 10*9/L (ref 0.0–0.5)
EOSINOPHILS RELATIVE PERCENT: 0.3 %
HEMATOCRIT: 27.9 % — ABNORMAL LOW (ref 39.0–48.0)
HEMOGLOBIN: 9.2 g/dL — ABNORMAL LOW (ref 12.9–16.5)
LYMPHOCYTES ABSOLUTE COUNT: 0.5 10*9/L — ABNORMAL LOW (ref 1.1–3.6)
LYMPHOCYTES RELATIVE PERCENT: 5.9 %
MEAN CORPUSCULAR HEMOGLOBIN CONC: 32.7 g/dL (ref 32.0–36.0)
MEAN CORPUSCULAR HEMOGLOBIN: 28 pg (ref 25.9–32.4)
MEAN CORPUSCULAR VOLUME: 85.4 fL (ref 77.6–95.7)
MEAN PLATELET VOLUME: 9.4 fL (ref 6.8–10.7)
MONOCYTES ABSOLUTE COUNT: 1 10*9/L — ABNORMAL HIGH (ref 0.3–0.8)
MONOCYTES RELATIVE PERCENT: 13 %
NEUTROPHILS ABSOLUTE COUNT: 6.2 10*9/L (ref 1.8–7.8)
NEUTROPHILS RELATIVE PERCENT: 80.4 %
PLATELET COUNT: 176 10*9/L (ref 150–450)
RED BLOOD CELL COUNT: 3.27 10*12/L — ABNORMAL LOW (ref 4.26–5.60)
RED CELL DISTRIBUTION WIDTH: 24.8 % — ABNORMAL HIGH (ref 12.2–15.2)
WBC ADJUSTED: 7.7 10*9/L (ref 3.6–11.2)

## 2021-06-18 LAB — MAGNESIUM: MAGNESIUM: 1.8 mg/dL (ref 1.6–2.6)

## 2021-06-18 LAB — URIC ACID: URIC ACID: 2.9 mg/dL — ABNORMAL LOW

## 2021-06-18 LAB — SLIDE REVIEW

## 2021-06-18 LAB — LACTATE DEHYDROGENASE: LACTATE DEHYDROGENASE: 239 U/L (ref 120–246)

## 2021-06-18 LAB — PHOSPHORUS: PHOSPHORUS: 2.4 mg/dL (ref 2.4–5.1)

## 2021-06-18 MED ADMIN — venetoclax (VENCLEXTA) tablet 400 mg **PATIENT SUPPLIED**: 400 mg | ORAL | @ 22:00:00 | Stop: 2021-07-05

## 2021-06-18 MED ADMIN — azaCITIDine (VIDAZA) syringe: 200 mg | SUBCUTANEOUS | @ 19:00:00 | Stop: 2021-06-18

## 2021-06-18 MED ADMIN — cefepime (MAXIPIME) 2 g in sodium chloride 0.9 % (NS) 100 mL IVPB-connector bag: 2 g | INTRAVENOUS | @ 10:00:00 | Stop: 2021-06-22

## 2021-06-18 MED ADMIN — valACYclovir (VALTREX) tablet 500 mg: 500 mg | ORAL | @ 13:00:00

## 2021-06-18 MED ADMIN — ondansetron (ZOFRAN) tablet 8 mg: 8 mg | ORAL | @ 18:00:00 | Stop: 2021-06-18

## 2021-06-18 MED ADMIN — allopurinol (ZYLOPRIM) tablet 300 mg: 300 mg | ORAL | @ 13:00:00

## 2021-06-18 MED ADMIN — pantoprazole (PROTONIX) EC tablet 40 mg: 40 mg | ORAL | @ 01:00:00

## 2021-06-18 MED ADMIN — enoxaparin (LOVENOX) syringe 40 mg: 40 mg | SUBCUTANEOUS | @ 13:00:00

## 2021-06-18 MED ADMIN — cefepime (MAXIPIME) 2 g in sodium chloride 0.9 % (NS) 100 mL IVPB-connector bag: 2 g | INTRAVENOUS | @ 01:00:00 | Stop: 2021-06-22

## 2021-06-18 MED ADMIN — pantoprazole (PROTONIX) EC tablet 40 mg: 40 mg | ORAL | @ 13:00:00

## 2021-06-18 MED ADMIN — cefepime (MAXIPIME) 2 g in sodium chloride 0.9 % (NS) 100 mL IVPB-connector bag: 2 g | INTRAVENOUS | @ 18:00:00 | Stop: 2021-06-22

## 2021-06-18 MED ADMIN — potassium chloride ER tablet 40 mEq: 40 meq | ORAL | @ 18:00:00 | Stop: 2021-06-18

## 2021-06-18 NOTE — Unmapped (Signed)
Pt has been afebrile, VSS. No reports of pain noted. Pt did report two episodes of diarrhea overnight, MD notified. No new orders of placed. Will continue to monitor pt status.

## 2021-06-18 NOTE — Unmapped (Signed)
PHYSICAL THERAPY  Evaluation (06/18/21 0916)      Patient Name:  Gary Baird       Medical Record Number: [id number]   Date of Birth: [birth year]  Sex: Male        Treatment Diagnosis: Pt presenting at baseline level of functional mobility     Activity Tolerance: Tolerated treatment well     ASSESSMENT  Problem List: Decreased endurance      Assessment : Wiley Magan is a 81 y.o. 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     Pt was agreeable to PT eval and presented with the above listed impairments. Today he was able to demonstrate room level mobility with supervision. He is presenting at his recent baseline level of function. At this time, he does not demonstrate a need for additional skilled acute or post-acute PT needs.     After a review of the personal factors, comorbidities, clinical presentation, and examination of the number of affected body systems, the patient presents as a low complexity case.      Today's Interventions: PT eval, pt education re role of PT and POC, importance of upright/OOB and daily ambulation     PLAN  Planned Frequency of Treatment:  D/C Services for: D/C Services       Post-Discharge Physical Therapy Recommendations:  Skilled PT services NOT indicated     PT DME Recommendations: None            Goals:   Patient and Family Goals: continue mowing his lawn     Long Term Goal #1: Pt will ambulate 1000' on level and unlevel surfaces in 8 weeks.       Prognosis:  Good  Positive Indicators: CLOF, family support  Barriers to Discharge: None     SUBJECTIVE  Patient reports: RN cleared pt for PT, pt agreeable to session.  Current Functional Status: Pt received in bathroom and left in chair with all needs met.     Prior Functional Status: Pt is indep at baseline and has a SPC he carries during longer distances to use for rest. Denies any recent falls. Has been mowing his lawn some.  Equipment available at home: Straight cane      Past Medical History:   Diagnosis Date    Cancer (CMS-HCC)     Hypertension     Leukemia (CMS-HCC)             Social History     Tobacco Use    Smoking status: Never    Smokeless tobacco: Never   Substance Use Topics    Alcohol use: Not Currently       Past Surgical History:   Procedure Laterality Date    HERNIA REPAIR  1975    PR COLONOSCOPY FLX DX W/COLLJ SPEC WHEN PFRMD N/A 02/02/2021    Procedure: COLONOSCOPY, FLEXIBLE, PROXIMAL TO SPLENIC FLEXURE; DIAGNOSTIC, W/WO COLLECTION SPECIMEN BY BRUSH OR WASH;  Surgeon: Mayford Knife, MD;  [facility]: [facility];  Service: Gastroenterology    PR ENDOSCOPY UPPER SMALL INTESTINE N/A 03/17/2021    Procedure: SMALL INTESTINAL ENDOSCOPY, ENTEROSCOPY BEYOND SECOND PORTION OF DUODENUM, NOT INCL ILEUM; DX, INCL COLLECTION OF SPECIMEN(S) BY BRUSHING OR WASHING, WHEN PERFORMED;  Surgeon: Vonda Antigua, MD;  Location: [facility];  Service: Gastroenterology  PR GASTROINTESTINAL TRACT IMAGING ESOPHAGUS W/I&R N/A 02/02/2021    Procedure: GI PATENCY TRACT IMAGING, INTRALUMINAL (EG, CAPSULE ENDOSCOPY), ESOPHAGUS W/PHYSICIAN INTERP/REPORT;  Surgeon: Mayford Knife, MD;  Location: [facility];  Service: Gastroenterology    PR GI IMAG INTRALUMINAL ESOPHAGUS-ILEUM W/I&R N/A 03/15/2021    Procedure: GI VIDEO TRACT IMAGE INTRALUMINAL (EG, CAPSULE ENDOSCOPY), ESOPHAGUS VIA ILEUM, PHYSICIAN INTERPRETATION & REPORT;  Surgeon: Monte Fantasia, MD;  Location: [facility];  Service: Gastroenterology    PR UPPER GI ENDOSCOPY,DIAGNOSIS N/A 02/02/2021    Procedure: UGI ENDO, INCLUDE ESOPHAGUS, STOMACH, & DUODENUM &/OR JEJUNUM; DX W/WO COLLECTION SPECIMN, BY BRUSH OR WASH;  Surgeon: Mayford Knife, MD;  Location: [facility];  Service: Gastroenterology    SKIN BIOPSY Family History   Problem Relation Age of Onset    Alzheimer's disease Father     Melanoma Neg Hx     Basal cell carcinoma Neg Hx     Squamous cell carcinoma Neg Hx         Allergies: Patient has no known allergies.     Objective Findings  Precautions / Restrictions  Precautions: Falls precautions, Protective precautions, Other precautions  Weight Bearing Status: Non-applicable  Required Braces or Orthoses: Non-applicable     Communication Preference: Verbal          Pain Comments: denies pain  Medical Tests / Procedures: Reviewed in Epic  Equipment / Environment: Vascular access (PIV, TLC, Port-a-cath, PICC)     At Rest: VSS per Epic  With Activity: NAD  Orthostatics: asymptomatic        Living Situation  Living Environment: Mobile home  Lives With: Daughter, Significant other  Home Living: One level home, Stairs to enter with rails, Tub/shower unit, Tub bench, Standard height toilet  Rail placement (outside): Bilateral rails  Number of Stairs to Enter (outside): 6      Cognition: [facility]        Skin Inspection: Intact where visualized     Upper Extremities  UE ROM: Right WFL, Left WFL  UE Strength: Right WFL, Left WFL    Lower Extremities  LE ROM: Right WFL, Left WFL  LE Strength: Right WFL, Left WFL     Balance: WFL      Bed Mobility: NT - pt OOB at start/end of session     Transfers: Sit to Stand  Sit to Stand assistance level: Standby assist, set-up cues, supervision of patient - no hands on  Transfer comments: pt stands with a wide BOS and requires BUE support      Gait Level of Assistance: Standby assist, set-up cues, supervision of patient - no hands on  Gait Assistive Device: None  Gait Distance Ambulated (ft): 15 ft  Gait: Pt ambulated 15 ft in room with supervision and no device. Increased BOS and occassionally uses furniture. No overt LOB.     Endurance: fair     Physical Therapy Session Duration  PT Individual [mins]: 8     Medical Staff Made Aware: RN     I attest that I have reviewed the above information.  SignedHenrietta Dine, PT  Filed 06/18/2021

## 2021-06-18 NOTE — Unmapped (Signed)
OCCUPATIONAL THERAPY  Evaluation (06/18/21 0919)    Patient Name:  Gary Baird       Medical Record Number: [id number]   Date of Birth: Mar 23, 1940  Sex: Male            OT Treatment Diagnosis:  baseline level of ADL independence    Assessment  Problem List: Decreased endurance       Assessment: Mr. Maland is a 81 y/o M with AML cycle 3 day 5 of azacitidine/venetoclax presents with fever and concern for sepsis. He presents to acute OT at baseline level of ADL and functional mobility independence. He was encouraged maintain daily ADL routine and increase his time OOB while admitted. He has no further acute OT needs or post acute OT needs. Will discharge acute OT at this time. After review of contributing co-mobidities and personal factors, clinical presentation and exam findings, patient demonstrates moderate complexity for evaluation and development of plan of care.    Today's Interventions: Other    Today's Interventions: OOB to chair, mobility to and from bathroom. Pt education re: role of acute OT, POC, benefits and importance of OOB activity to prevent deconditioning, daily ADL routine    Activity Tolerance During Today's Session  Tolerated treatment well    Plan  Planned Frequency of Treatment:  D/C Services for: D/C Services       Planned Interventions:       Post-Discharge Occupational Therapy Recommendations:   Skilled OT services NOT indicated   OT DME Recommendations: None -        GOALS:   Patient and Family Goals: To go home    Prognosis:  Good  Positive Indicators:  PLOF, family support  Barriers to Discharge: None    Subjective  Current Status Pt left sitting in chair with all immediate needs met, call bell in reach, RN aware  Prior Functional Status Pt reports mod I/independence at functional baseline. He intermittently uses a cane for functional mobility and denies the use of any other equipment to assist with daily activities/routines. Pt enjoys cutting grass with his riding lawn mower. Has good support from family. Denies falls.    Medical Tests / Procedures: Reviewed       Patient / Caregiver reports: I was out mowing last week    Past Medical History:   Diagnosis Date    Cancer (CMS-HCC)     Hypertension     Leukemia (CMS-HCC)     Social History     Tobacco Use    Smoking status: Never    Smokeless tobacco: Never   Substance Use Topics    Alcohol use: Not Currently      Past Surgical History:   Procedure Laterality Date    HERNIA REPAIR  1975    PR COLONOSCOPY FLX DX W/COLLJ SPEC WHEN PFRMD N/A 02/02/2021    Procedure: COLONOSCOPY, FLEXIBLE, PROXIMAL TO SPLENIC FLEXURE; DIAGNOSTIC, W/WO COLLECTION SPECIMEN BY BRUSH OR WASH;  Surgeon: Mayford Knife, MD;  Location: [facility];  Service: Gastroenterology    PR ENDOSCOPY UPPER SMALL INTESTINE N/A 03/17/2021    Procedure: SMALL INTESTINAL ENDOSCOPY, ENTEROSCOPY BEYOND SECOND PORTION OF DUODENUM, NOT INCL ILEUM; DX, INCL COLLECTION OF SPECIMEN(S) BY BRUSHING OR WASHING, WHEN PERFORMED;  Surgeon: Vonda Antigua, MD;  Location: [facility];  Service: Gastroenterology    PR GASTROINTESTINAL TRACT IMAGING ESOPHAGUS W/I&R N/A 02/02/2021    Procedure: GI PATENCY TRACT IMAGING, INTRALUMINAL (EG, CAPSULE ENDOSCOPY), ESOPHAGUS W/PHYSICIAN INTERP/REPORT;  Surgeon: Mayford Knife, MD;  Location: [facility];  Service: Gastroenterology    PR GI IMAG INTRALUMINAL ESOPHAGUS-ILEUM W/I&R N/A 03/15/2021    Procedure: GI VIDEO TRACT IMAGE INTRALUMINAL (EG, CAPSULE ENDOSCOPY), ESOPHAGUS VIA ILEUM, PHYSICIAN INTERPRETATION & REPORT;  Surgeon: Monte Fantasia, MD;  Location: GI [facility]URES [facility];  Service: Gastroenterology    PR UPPER GI ENDOSCOPY,DIAGNOSIS N/A 02/02/2021    Procedure: UGI ENDO, INCLUDE ESOPHAGUS, STOMACH, & DUODENUM &/OR JEJUNUM; DX W/WO COLLECTION SPECIMN, BY BRUSH OR WASH;  Surgeon: Mayford Knife, MD;  Location: [facility];  Service: Gastroenterology    SKIN BIOPSY Family History   Problem Relation Age of Onset    Alzheimer's disease Father     Melanoma Neg Hx     Basal cell carcinoma Neg Hx     Squamous cell carcinoma Neg Hx         Patient has no known allergies.     Objective Findings  Precautions / Restrictions  Falls precautions, Protective precautions, Other precautions    Weight Bearing  Non-applicable    Required Braces or Orthoses  Non-applicable    Communication Preference  Verbal    Pain  Pt denies pain    Equipment / Environment  Vascular access (PIV, TLC, Port-a-cath, PICC)    Living Situation  Living Environment: Mobile home  Lives With: Daughter, Significant other  Home Living: One level home, Stairs to enter with rails, Tub/shower unit, Tub bench, Standard height toilet  Rail placement (outside): Bilateral rails  Number of Stairs to Enter (outside): 6  Equipment available at home: Straight cane     Cognition   Orientation Level:  Oriented x 4   Arousal/Alertness:  Appropriate responses to stimuli   Attention Span:  Appears intact   Memory:  Appears intact   Following Commands:  Follows all commands and directions without difficulty   Safety Judgment:  Good awareness of safety precautions   Awareness of Errors:  Good awareness of errors made   Problem Solving:  Able to problem solve independently   Comments:      Vision / Hearing   Vision: No deficits identified     Hearing: No deficit identified         Hand Function:  Right Hand Function: Right hand function impaired  Right Hand Impairment: coordination impaired, grip strength poor (baseline for pt, Polio as a child)  Hand Dominance: Left    Skin Inspection:  Skin Inspection: Intact where visualized    ROM / Strength:  UE ROM/Strength: Left WFL, Right WFL  LE ROM/Strength: Left WFL, Right WFL    Coordination:  Coordination: WFL    Sensation:  RUE Sensation: RUE intact  LUE Sensation: LUE intact  RLE Sensation: RLE intact  LLE Sensation: LLE intact    Balance:  Sitting/standing: independent    Functional Mobility  Transfer Assistance Needed: No  Bed Mobility Assistance Needed: No  Ambulation: Independent for short mobility in room      ADLs  ADLs: Independent      Vitals / Orthostatics  With Activity: NAD      Medical Staff Made Aware: RN aware      Occupational Therapy Session Duration  OT Individual [mins]: 12         I attest that I have reviewed the above information.  Signed: Jaclyn Shaggy, OT  Filed 06/18/2021

## 2021-06-18 NOTE — Unmapped (Signed)
Hematology Resident (MEDE) Progress Note    Assessment & Plan:   Gary Baird is a 81 y.o. 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 *    Fever, Dehydration, Sepsis: Infectious work-up thus far unremarkable including RPP, chest x-ray, blood cultures.  Febrile yesterday, 4/14 to 38.7 again.  Still suspect infectious etiology of fevers though unclear source at this time.  -continue vanc/cefepime x48 hours while watching blood cultures  -f/u MRSA nares, if negative  -f/u blood cultures, no growth to date at 24 hours  -f/u RPP  -Stool studies as below  ??  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 in [place], see HPI for full onc hx.  -Plan to resume home azacitidine/venetoclax  this evening  -daily CBC w/ diff, Chem10, TLS labs  -allopurinol daily  -Valtrex ppx    Watery Diarrhea: Patient with multiple weeks of loose, watery diarrhea.  States he has intermittent prolonged episodes of this diarrhea.  Have a negative infectious work-up above thus far will assess with stool studies.  - GI PP, C. difficile ordered    Hypokalemia: Potassium 3.0 this a.m., repleted with p.o. supplementation.    Chronic Problems:  HTN: hold home hydrochlorothiazide 12.5 daily, lasix 20 daily while evaluating for infection as above  GERD: pantoprazole 40 mg BID    Daily Checklist:  Diet: Regular Diet  DVT PPx: Lovenox 40mg  q24h  Electrolytes: Replete Potassium to >/= 3.6 and Magnesium to >/= 1.8  Code Status: DNR and DNI  Dispo: Continue floor care    Team Contact Information:   Primary Team: Hematology Resident (MEDE)  Primary Resident: Mellody Dance, MD  Resident's Pager: [phone] (Hematology Senior Resident)    Interval History:   No acute events overnight.  No chills overnight.  Reports some continued watery bowel movements this morning which have continued for the last few weeks.    ROS: Denies headache, chest pain, shortness of breath, abdominal pain, nausea, vomiting.    Objective:   Temp:  [36.7 ??C (98.1 ??F)-38.7 ??C (101.6 ??F)] 36.9 ??C (98.4 ??F)  Heart Rate:  [94-110] 103  SpO2 Pulse:  [93] 93  Resp:  [15-18] 18  BP: (100-124)/(5-76) 111/56  SpO2:  [92 %-100 %] 98 %    Gen: NAD, answers questions appropriately  Eyes: sclera anicteric, EOMI  HENT: atraumatic, MMM, OP w/o erythema or exudate   Heart: RRR, S1, S2, no M/R/G, no chest wall tenderness  Lungs: CTAB, no crackles or wheezes, no use of accessory muscles  Abdomen: Normoactive bowel sounds, soft, NTND, no rebound/guarding  Extremities: no clubbing, cyanosis, or edema in the BLEs  Psych: Alert, oriented, appropriate mood and affect    Labs/Studies: Labs and Studies from the last 24hrs per EMR and Reviewed

## 2021-06-18 NOTE — Unmapped (Signed)
AxOx4. VSS. Denies nausea, diarrhea, or pain. No acute event during shift. WCTM.    Problem: Adult Inpatient Plan of Care  Goal: Plan of Care Review  Outcome: Progressing  Goal: Patient-Specific Goal (Individualized)  Outcome: Progressing  Goal: Absence of Hospital-Acquired Illness or Injury  Outcome: Progressing  Intervention: Identify and Manage Fall Risk  Recent Flowsheet Documentation  Taken 06/18/2021 0800 by Leeanne Rio, RN  Safety Interventions:   lighting adjusted for tasks/safety   low bed   nonskid shoes/slippers when out of bed  Intervention: Prevent Skin Injury  Recent Flowsheet Documentation  Taken 06/18/2021 0800 by Leeanne Rio, RN  Skin Protection: adhesive use limited  Intervention: Prevent and Manage VTE (Venous Thromboembolism) Risk  Recent Flowsheet Documentation  Taken 06/18/2021 0800 by Leeanne Rio, RN  Activity Management: up in chair  Goal: Optimal Comfort and Wellbeing  Outcome: Progressing  Goal: Readiness for Transition of Care  Outcome: Progressing  Goal: Rounds/Family Conference  Outcome: Progressing     Problem: Self-Care Deficit  Goal: Improved Ability to Complete Activities of Daily Living  Outcome: Progressing

## 2021-06-19 LAB — CBC W/ AUTO DIFF
BASOPHILS ABSOLUTE COUNT: 0 10*9/L (ref 0.0–0.1)
BASOPHILS RELATIVE PERCENT: 0.4 %
EOSINOPHILS ABSOLUTE COUNT: 0 10*9/L (ref 0.0–0.5)
EOSINOPHILS RELATIVE PERCENT: 0.7 %
HEMATOCRIT: 28.4 % — ABNORMAL LOW (ref 39.0–48.0)
HEMOGLOBIN: 9.5 g/dL — ABNORMAL LOW (ref 12.9–16.5)
LYMPHOCYTES ABSOLUTE COUNT: 0.5 10*9/L — ABNORMAL LOW (ref 1.1–3.6)
LYMPHOCYTES RELATIVE PERCENT: 9.6 %
MEAN CORPUSCULAR HEMOGLOBIN CONC: 33.4 g/dL (ref 32.0–36.0)
MEAN CORPUSCULAR HEMOGLOBIN: 28.3 pg (ref 25.9–32.4)
MEAN CORPUSCULAR VOLUME: 84.8 fL (ref 77.6–95.7)
MEAN PLATELET VOLUME: 9.2 fL (ref 6.8–10.7)
MONOCYTES ABSOLUTE COUNT: 0.7 10*9/L (ref 0.3–0.8)
MONOCYTES RELATIVE PERCENT: 12.6 %
NEUTROPHILS ABSOLUTE COUNT: 4.3 10*9/L (ref 1.8–7.8)
NEUTROPHILS RELATIVE PERCENT: 76.7 %
PLATELET COUNT: 158 10*9/L (ref 150–450)
RED BLOOD CELL COUNT: 3.35 10*12/L — ABNORMAL LOW (ref 4.26–5.60)
RED CELL DISTRIBUTION WIDTH: 24.4 % — ABNORMAL HIGH (ref 12.2–15.2)
WBC ADJUSTED: 5.6 10*9/L (ref 3.6–11.2)

## 2021-06-19 LAB — SLIDE REVIEW

## 2021-06-19 LAB — BASIC METABOLIC PANEL
ANION GAP: 4 mmol/L — ABNORMAL LOW (ref 5–14)
ANION GAP: 7 mmol/L (ref 5–14)
BLOOD UREA NITROGEN: 7 mg/dL — ABNORMAL LOW (ref 9–23)
BLOOD UREA NITROGEN: 7 mg/dL — ABNORMAL LOW (ref 9–23)
BUN / CREAT RATIO: 10
BUN / CREAT RATIO: 12
CALCIUM: 8.6 mg/dL — ABNORMAL LOW (ref 8.7–10.4)
CALCIUM: 8.7 mg/dL (ref 8.7–10.4)
CHLORIDE: 103 mmol/L (ref 98–107)
CHLORIDE: 104 mmol/L (ref 98–107)
CO2: 30 mmol/L (ref 20.0–31.0)
CO2: 27 mmol/L (ref 20.0–31.0)
CREATININE: 0.7 mg/dL
CREATININE: 0.6 mg/dL
EGFR CKD-EPI (2021) MALE: >90 mL/min/{1.73_m2} (ref >=60)
EGFR CKD-EPI (2021) MALE: >90 mL/min/{1.73_m2} (ref >=60)
GLUCOSE RANDOM: 114 mg/dL (ref 70–179)
GLUCOSE RANDOM: 115 mg/dL (ref 70–179)
POTASSIUM: 3 mmol/L — ABNORMAL LOW (ref 3.4–4.8)
POTASSIUM: 3.2 mmol/L — ABNORMAL LOW (ref 3.4–4.8)
SODIUM: 137 mmol/L (ref 135–145)
SODIUM: 138 mmol/L (ref 135–145)

## 2021-06-19 LAB — MAGNESIUM
MAGNESIUM: 1.8 mg/dL (ref 1.6–2.6)
MAGNESIUM: 1.8 mg/dL (ref 1.6–2.6)

## 2021-06-19 LAB — LACTATE DEHYDROGENASE: LACTATE DEHYDROGENASE: 234 U/L (ref 120–246)

## 2021-06-19 LAB — PHOSPHORUS: PHOSPHORUS: 2.3 mg/dL — ABNORMAL LOW (ref 2.4–5.1)

## 2021-06-19 LAB — URIC ACID: URIC ACID: 3 mg/dL — ABNORMAL LOW

## 2021-06-19 MED ORDER — FIDAXOMICIN 200 MG TABLET
ORAL_TABLET | Freq: Two times a day (BID) | ORAL | 0 refills | 10 days
Start: 2021-06-19 — End: 2021-06-19

## 2021-06-19 MED ADMIN — allopurinol (ZYLOPRIM) tablet 300 mg: 300 mg | ORAL | @ 13:00:00

## 2021-06-19 MED ADMIN — potassium chloride 10 mEq in 100 mL IVPB: 10 meq | INTRAVENOUS | @ 14:00:00 | Stop: 2021-06-19

## 2021-06-19 MED ADMIN — potassium chloride ER tablet 40 mEq: 40 meq | ORAL | @ 13:00:00 | Stop: 2021-06-19

## 2021-06-19 MED ADMIN — potassium chloride ER tablet 40 mEq: 40 meq | ORAL | @ 21:00:00 | Stop: 2021-06-19

## 2021-06-19 MED ADMIN — valACYclovir (VALTREX) tablet 500 mg: 500 mg | ORAL | @ 13:00:00

## 2021-06-19 MED ADMIN — pantoprazole (PROTONIX) EC tablet 40 mg: 40 mg | ORAL | @ 01:00:00

## 2021-06-19 MED ADMIN — pantoprazole (PROTONIX) EC tablet 40 mg: 40 mg | ORAL | @ 13:00:00

## 2021-06-19 MED ADMIN — potassium chloride 10 mEq in 100 mL IVPB: 10 meq | INTRAVENOUS | @ 15:00:00 | Stop: 2021-06-19

## 2021-06-19 MED ADMIN — fidaxomicin (DIFICID) tablet 200 mg: 200 mg | ORAL | @ 13:00:00 | Stop: 2021-06-29

## 2021-06-19 MED ADMIN — ciprofloxacin HCl (CILOXAN) 0.3 % ophthalmic ointment: OPHTHALMIC | @ 15:00:00

## 2021-06-19 MED ADMIN — potassium chloride 10 mEq in 100 mL IVPB: 10 meq | INTRAVENOUS | @ 17:00:00 | Stop: 2021-06-19

## 2021-06-19 MED ADMIN — prednisoLONE acetate (PRED FORTE) 1 % ophthalmic suspension 1 drop: 1 [drp] | OPHTHALMIC | @ 15:00:00

## 2021-06-19 MED ADMIN — enoxaparin (LOVENOX) syringe 40 mg: 40 mg | SUBCUTANEOUS | @ 13:00:00

## 2021-06-19 MED ADMIN — cefepime (MAXIPIME) 2 g in sodium chloride 0.9 % (NS) 100 mL IVPB-connector bag: 2 g | INTRAVENOUS | @ 01:00:00 | Stop: 2021-06-22

## 2021-06-19 MED ADMIN — venetoclax (VENCLEXTA) tablet 400 mg **PATIENT SUPPLIED**: 400 mg | ORAL | @ 21:00:00 | Stop: 2021-07-05

## 2021-06-19 MED ADMIN — cefepime (MAXIPIME) 2 g in sodium chloride 0.9 % (NS) 100 mL IVPB-connector bag: 2 g | INTRAVENOUS | @ 10:00:00 | Stop: 2021-06-19

## 2021-06-19 MED ADMIN — ciprofloxacin HCl (CILOXAN) 0.3 % ophthalmic ointment: OPHTHALMIC | @ 21:00:00

## 2021-06-19 MED ADMIN — traZODone (DESYREL) tablet 50 mg: 50 mg | ORAL | @ 01:00:00

## 2021-06-19 MED ADMIN — potassium chloride 10 mEq in 100 mL IVPB: 10 meq | INTRAVENOUS | @ 13:00:00 | Stop: 2021-06-19

## 2021-06-19 NOTE — Unmapped (Signed)
Hematology Resident (MEDE) Progress Note    Assessment & Plan:   Gary Baird is a 81 y.o. 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 Diff.     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 *    C difficile - Fever, resolved - Watery diarrhea: Infectious work-up thus far notable only for C diff positivity, with blood cultures NGTD. Now afebrile and feeling improved.   - PO Fidaxomicin bid (4/16 - 4/25)  - Discontinue cefepime   - F/u blood cultures from 4/13, NGTD  - Enteric precautions  ??  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 Follows with [name] in [place], see HPI for full onc hx.  - Continue home azacitidine/venetoclax   - Allopurinol daily  - Valtrex ppx    Hypokalemia: Potassium 3.0 this a.m., repleting with IV and PO supplementation.    Chronic Problems:  HTN: hold home hydrochlorothiazide 12.5 daily, lasix 20 daily while evaluating for infection as above  GERD: pantoprazole 40 mg BID  Recent cataract surgery: continue home prednisolone and ofloxacin eye drops    Daily Checklist:  Diet: Regular Diet  DVT PPx: Lovenox 40mg  q24h  Electrolytes: Replete Potassium to >/= 3.6 and Magnesium to >/= 1.8  Code Status: DNR and DNI  Dispo: Continue floor care, hopeful discharge home on 4/17    Team Contact Information:   Primary Team: Hematology Resident (MEDE)  Primary Resident: Starleen Blue, MD  Resident's Pager: [phone] (Hematology Senior Resident)    Interval History:   No acute ON events, except for positive C. Diff test result.     This morning, he reports feeling well. No recurrent fevers or chills. He is tolerating PO. Denies abdominal pain though has had a couple episodes of diarrhea. He did well with PT (no needs identified) and looks forward to returning home, though he and his family feel that he is not quite ready for discharge today.     ROS: Denies headache, chest pain, shortness of breath, abdominal pain, nausea, vomiting.    Objective:   Temp:  [36.6 ??C (97.9 ??F)-36.9 ??C (98.4 ??F)] 36.6 ??C (97.9 ??F)  Heart Rate:  [75-118] 117  Resp:  [16-18] 16  BP: (100-130)/(54-61) 130/61  SpO2:  [98 %-100 %] 99 %    Gen: NAD, answers questions appropriately  Eyes: Sclera anicteric, EOMI  HENT: Atraumatic, MMM, OP w/o erythema or exudate   Heart: RRR, S1, S2, no M/R/G, no chest wall tenderness  Lungs: CTAB, no crackles or wheezes, no use of accessory muscles  Abdomen: Soft, NTND, no rebound/guarding  Extremities: Trace edema in the BLEs  Psych: Alert, oriented, appropriate mood and affect    Labs/Studies: Labs and Studies from the last 24hrs per EMR and Reviewed

## 2021-06-19 NOTE — Unmapped (Signed)
Pt alert x oriented, afebrile, VSS. No reports of pain noted. Pt reports one loose BM overnight. Pt given IV ABX as scheduled. Will continue to monitor pt status.

## 2021-06-19 NOTE — Unmapped (Addendum)
AxOx4. VSS. Denies nausea or abdominal pain. Loose BM x1 since morning, Fidaxomicin started. Enteric isolation maintained. No acute event during shift. WCTM.      Problem: Adult Inpatient Plan of Care  Goal: Plan of Care Review  Outcome: Progressing  Goal: Patient-Specific Goal (Individualized)  Outcome: Progressing  Goal: Absence of Hospital-Acquired Illness or Injury  Outcome: Progressing  Intervention: Identify and Manage Fall Risk  Recent Flowsheet Documentation  Taken 06/19/2021 0800 by Leeanne Rio, RN  Safety Interventions:   lighting adjusted for tasks/safety   low bed   fall reduction program maintained   nonskid shoes/slippers when out of bed  Intervention: Prevent Skin Injury  Recent Flowsheet Documentation  Taken 06/19/2021 0800 by Leeanne Rio, RN  Skin Protection: adhesive use limited  Intervention: Prevent and Manage VTE (Venous Thromboembolism) Risk  Recent Flowsheet Documentation  Taken 06/19/2021 0800 by Leeanne Rio, RN  Activity Management: activity adjusted per tolerance  Goal: Optimal Comfort and Wellbeing  Outcome: Progressing  Goal: Readiness for Transition of Care  Outcome: Progressing  Goal: Rounds/Family Conference  Outcome: Progressing     Problem: Infection  Goal: Absence of Infection Signs and Symptoms  Outcome: Progressing  Intervention: Prevent or Manage Infection  Recent Flowsheet Documentation  Taken 06/19/2021 0800 by Leeanne Rio, RN  Isolation Precautions: enteric precautions maintained     Problem: Self-Care Deficit  Goal: Improved Ability to Complete Activities of Daily Living  Outcome: Progressing

## 2021-06-20 LAB — BASIC METABOLIC PANEL
ANION GAP: 4 mmol/L — ABNORMAL LOW (ref 5–14)
BLOOD UREA NITROGEN: 5 mg/dL — ABNORMAL LOW (ref 9–23)
BUN / CREAT RATIO: 8
CALCIUM: 8.5 mg/dL — ABNORMAL LOW (ref 8.7–10.4)
CHLORIDE: 106 mmol/L (ref 98–107)
CO2: 29 mmol/L (ref 20.0–31.0)
CREATININE: 0.63 mg/dL
EGFR CKD-EPI (2021) MALE: >90 mL/min/{1.73_m2} (ref >=60)
GLUCOSE RANDOM: 101 mg/dL (ref 70–179)
POTASSIUM: 3.3 mmol/L — ABNORMAL LOW (ref 3.4–4.8)
SODIUM: 139 mmol/L (ref 135–145)

## 2021-06-20 LAB — MAGNESIUM: MAGNESIUM: 1.8 mg/dL (ref 1.6–2.6)

## 2021-06-20 LAB — CBC W/ AUTO DIFF
BASOPHILS ABSOLUTE COUNT: 0 10*9/L (ref 0.0–0.1)
BASOPHILS RELATIVE PERCENT: 0.5 %
EOSINOPHILS ABSOLUTE COUNT: 0.1 10*9/L (ref 0.0–0.5)
EOSINOPHILS RELATIVE PERCENT: 2.4 %
HEMATOCRIT: 28.8 % — ABNORMAL LOW (ref 39.0–48.0)
HEMOGLOBIN: 9.4 g/dL — ABNORMAL LOW (ref 12.9–16.5)
LYMPHOCYTES ABSOLUTE COUNT: 0.7 10*9/L — ABNORMAL LOW (ref 1.1–3.6)
LYMPHOCYTES RELATIVE PERCENT: 15.4 %
MEAN CORPUSCULAR HEMOGLOBIN CONC: 32.8 g/dL (ref 32.0–36.0)
MEAN CORPUSCULAR HEMOGLOBIN: 28.2 pg (ref 25.9–32.4)
MEAN CORPUSCULAR VOLUME: 86 fL (ref 77.6–95.7)
MEAN PLATELET VOLUME: 9.2 fL (ref 6.8–10.7)
MONOCYTES ABSOLUTE COUNT: 0.4 10*9/L (ref 0.3–0.8)
MONOCYTES RELATIVE PERCENT: 8.1 %
NEUTROPHILS ABSOLUTE COUNT: 3.3 10*9/L (ref 1.8–7.8)
NEUTROPHILS RELATIVE PERCENT: 73.6 %
PLATELET COUNT: 145 10*9/L — ABNORMAL LOW (ref 150–450)
RED BLOOD CELL COUNT: 3.35 10*12/L — ABNORMAL LOW (ref 4.26–5.60)
RED CELL DISTRIBUTION WIDTH: 25 % — ABNORMAL HIGH (ref 12.2–15.2)
WBC ADJUSTED: 4.5 10*9/L (ref 3.6–11.2)

## 2021-06-20 LAB — SLIDE REVIEW

## 2021-06-20 LAB — LACTATE DEHYDROGENASE: LACTATE DEHYDROGENASE: 234 U/L (ref 120–246)

## 2021-06-20 LAB — PHOSPHORUS: PHOSPHORUS: 2.1 mg/dL — ABNORMAL LOW (ref 2.4–5.1)

## 2021-06-20 LAB — URIC ACID: URIC ACID: 2.7 mg/dL — ABNORMAL LOW

## 2021-06-20 MED ORDER — FIDAXOMICIN 200 MG TABLET
ORAL_TABLET | Freq: Two times a day (BID) | ORAL | 0 refills | 9 days | Status: CP
Start: 2021-06-20 — End: 2021-06-29
  Filled 2021-06-20: qty 17, 9d supply, fill #0

## 2021-06-20 MED ORDER — POTASSIUM CHLORIDE ER 20 MEQ TABLET,EXTENDED RELEASE(PART/CRYST)
ORAL_TABLET | Freq: Every day | ORAL | 0 refills | 7.00000 days | Status: CN
Start: 2021-06-20 — End: 2021-06-27

## 2021-06-20 MED ADMIN — potassium chloride ER tablet 40 mEq: 40 meq | ORAL | @ 13:00:00 | Stop: 2021-06-20

## 2021-06-20 MED ADMIN — fidaxomicin (DIFICID) tablet 200 mg: 200 mg | ORAL | @ 13:00:00 | Stop: 2021-06-20

## 2021-06-20 MED ADMIN — ciprofloxacin HCl (CILOXAN) 0.3 % ophthalmic ointment: OPHTHALMIC | @ 18:00:00 | Stop: 2021-06-20

## 2021-06-20 MED ADMIN — prednisoLONE acetate (PRED FORTE) 1 % ophthalmic suspension 1 drop: 1 [drp] | OPHTHALMIC | @ 01:00:00

## 2021-06-20 MED ADMIN — pantoprazole (PROTONIX) EC tablet 40 mg: 40 mg | ORAL | @ 01:00:00

## 2021-06-20 MED ADMIN — pantoprazole (PROTONIX) EC tablet 40 mg: 40 mg | ORAL | @ 13:00:00 | Stop: 2021-06-20

## 2021-06-20 MED ADMIN — prednisoLONE acetate (PRED FORTE) 1 % ophthalmic suspension 1 drop: 1 [drp] | OPHTHALMIC | @ 18:00:00 | Stop: 2021-06-20

## 2021-06-20 MED ADMIN — potassium chloride ER tablet 40 mEq: 40 meq | ORAL | @ 16:00:00 | Stop: 2021-06-20

## 2021-06-20 MED ADMIN — melatonin tablet 9 mg: 9 mg | ORAL | @ 01:00:00

## 2021-06-20 MED ADMIN — potassium phosphate 15 mmol in sodium chloride (NS) 0.9 % 250 mL infusion: 15 mmol | INTRAVENOUS | @ 16:00:00 | Stop: 2021-06-20

## 2021-06-20 MED ADMIN — enoxaparin (LOVENOX) syringe 40 mg: 40 mg | SUBCUTANEOUS | @ 13:00:00 | Stop: 2021-06-20

## 2021-06-20 MED ADMIN — prednisoLONE acetate (PRED FORTE) 1 % ophthalmic suspension 1 drop: 1 [drp] | OPHTHALMIC | @ 10:00:00 | Stop: 2021-06-20

## 2021-06-20 MED ADMIN — traZODone (DESYREL) tablet 50 mg: 50 mg | ORAL | @ 01:00:00

## 2021-06-20 MED ADMIN — valACYclovir (VALTREX) tablet 500 mg: 500 mg | ORAL | @ 13:00:00 | Stop: 2021-06-20

## 2021-06-20 MED ADMIN — allopurinol (ZYLOPRIM) tablet 300 mg: 300 mg | ORAL | @ 13:00:00 | Stop: 2021-06-20

## 2021-06-20 MED ADMIN — ciprofloxacin HCl (CILOXAN) 0.3 % ophthalmic ointment: OPHTHALMIC | @ 13:00:00 | Stop: 2021-06-20

## 2021-06-20 MED ADMIN — fidaxomicin (DIFICID) tablet 200 mg: 200 mg | ORAL | @ 01:00:00 | Stop: 2021-06-29

## 2021-06-20 MED ADMIN — ciprofloxacin HCl (CILOXAN) 0.3 % ophthalmic ointment: OPHTHALMIC | @ 01:00:00

## 2021-06-20 NOTE — Unmapped (Signed)
AxOx4. VSS. Discharge instruction explained to pt. Pt is currently running KPO4 infusion over 4 hours. Will remove PIV after completion of infusion. Pt's discharge medication and venetoclax provided to pt. Pt is sitting on chair. Will call transport when pt's infusion will be completed and his ride will be here.      Problem: Adult Inpatient Plan of Care  Goal: Plan of Care Review  Outcome: Resolved  Goal: Patient-Specific Goal (Individualized)  Outcome: Resolved  Goal: Absence of Hospital-Acquired Illness or Injury  Outcome: Resolved  Goal: Optimal Comfort and Wellbeing  Outcome: Resolved  Goal: Readiness for Transition of Care  Outcome: Resolved  Goal: Rounds/Family Conference  Outcome: Resolved     Problem: Self-Care Deficit  Goal: Improved Ability to Complete Activities of Daily Living  Outcome: Resolved     Problem: Infection  Goal: Absence of Infection Signs and Symptoms  Outcome: Resolved

## 2021-06-20 NOTE — Unmapped (Signed)
A&Ox4, VSS, afebrile, free from falls/injuries. Denies NVD and pain. Scheduled medicines given. Trazodone x1 PRN and Melatonin x1 PRN given for sleep. Falls precaution taken. No acute events this shift.  Vitals:    06/19/21 1527 06/19/21 2118 06/20/21 0018 06/20/21 0330   BP: 121/67 105/58 109/57 108/56   Pulse: 116 117 111 105   Resp: 18 18 18 18    Temp: 36.5 ??C (97.7 ??F) 37 ??C (98.6 ??F) 36.8 ??C (98.2 ??F) 36.8 ??C (98.2 ??F)   TempSrc: Oral Oral Oral Oral   SpO2: 100% 97% 98% 95%   Weight:       Height:           Problem: Adult Inpatient Plan of Care  Goal: Plan of Care Review  06/20/2021 0439 by Georgia Duff, RN  Outcome: Ongoing - Unchanged  06/20/2021 0439 by Georgia Duff, RN  Outcome: Ongoing - Unchanged  Goal: Patient-Specific Goal (Individualized)  06/20/2021 0439 by Georgia Duff, RN  Outcome: Ongoing - Unchanged  06/20/2021 0439 by Georgia Duff, RN  Outcome: Ongoing - Unchanged  Goal: Absence of Hospital-Acquired Illness or Injury  06/20/2021 0439 by Georgia Duff, RN  Outcome: Ongoing - Unchanged  06/20/2021 0439 by Georgia Duff, RN  Outcome: Ongoing - Unchanged  Intervention: Prevent and Manage VTE (Venous Thromboembolism) Risk  Recent Flowsheet Documentation  Taken 06/19/2021 1910 by Georgia Duff, RN  Activity Management: activity adjusted per tolerance  Goal: Optimal Comfort and Wellbeing  06/20/2021 0439 by Georgia Duff, RN  Outcome: Ongoing - Unchanged  06/20/2021 0439 by Georgia Duff, RN  Outcome: Ongoing - Unchanged  Goal: Readiness for Transition of Care  06/20/2021 0439 by Georgia Duff, RN  Outcome: Ongoing - Unchanged  06/20/2021 0439 by Georgia Duff, RN  Outcome: Ongoing - Unchanged  Goal: Rounds/Family Conference  06/20/2021 0439 by Georgia Duff, RN  Outcome: Ongoing - Unchanged  06/20/2021 0439 by Georgia Duff, RN  Outcome: Ongoing - Unchanged     Problem: Self-Care Deficit  Goal: Improved Ability to Complete Activities of Daily Living  06/20/2021 0439 by Georgia Duff, RN  Outcome: Ongoing - Unchanged  06/20/2021 0439 by Georgia Duff, RN  Outcome: Ongoing - Unchanged     Problem: Infection  Goal: Absence of Infection Signs and Symptoms  06/20/2021 0439 by Georgia Duff, RN  Outcome: Ongoing - Unchanged  06/20/2021 0439 by Georgia Duff, RN  Outcome: Ongoing - Unchanged

## 2021-06-20 NOTE — Unmapped (Signed)
Physician Discharge Summary [facility]  [facility] ONC [facility]  [address]  [place] [address]  Dept: [phone]  Loc: [phone]     Identifying Information:   Gary Baird  October 24, 1940  [id number]    Primary Care Physician: [facility]     Code Status: DNR and DNI    Admit Date: 06/16/2021    Discharge Date: 06/20/2021     Discharge To: Home    Discharge Service: [facility] - Hematology Res Floor Team (MEDE)     Discharge Attending Physician: Doreatha Lew, MD    Discharge Diagnoses:     Principal Problem:    C. 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    Hypokalemia POA: Yes    GERD (gastroesophageal reflux disease) POA: Yes    Fever and chills POA: Yes    Hx of cataract surgery POA: Not Applicable    Anemia POA: Yes    Hypophosphatemia POA: Clinically Undetermined  Resolved Problems:    Sepsis (CMS-HCC) POA: Yes      Hospital Course:   Outpatient follow up:  [ ]  He should have follow up within the week to check on his diarrheal frequency and assess need for more than 10 days of fidaxomicin    [ ]  BMP/Mg/Phos should be checked within the next week to assess whether he needs continued potassium repletion (discharged on 20 meq daily for 7 days)    [ ]  In the event that he is placed on Levaquin for prophylaxis in the future, would consider PO vancomycin for C diff ppx    [ ]  Patient denied taking hydrochlorothiazide or Lasix recently, and these remained discontinued at discharge, since he remained normotensive.     ----------------  Hospital course:  Gary Baird is a 81 y.o. male with HTN, GERD, and AML on azacitidine/venetoclax (on C3) who presented to [facility] with fever and watery diarrhea, found to be positive for C. Diff.     On arrival to ER, he was tachycardic to 120s with temp to 38.1. He had normal ANC (>3) and unremarkable CXR and UA. He was given fluid resuscitation and started on broad spectrum antibiotics (vanc and cefepime) initially.     On further discussion, he endorsed several weeks of watery diarrhea, and he tested positive for C. Diff. He was started on fidaxomicin. Broad spectrum antibiotics were stopped due to no growth on initial blood cultures and no other evidence of infection.    By day of discharge, his symptoms were improved and he had been afebrile for >48 hours. He denied abdominal pain, was tolerating PO, and was able to ambulate without issue. PT/OT evaluated with no needs identified. He will be discharged home with plan to complete total of 10 day course of PO fidaxomicin.      Other hospital problems addressed as below:    Hypokalemia: required repletion inpatient and will be discharged on PO Kcl 20 meq daily with recommendation for repeat BMP/Mg/Phos within the week.  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 Follows with [name] in [place]. Given his clinical stability, no changes made to this regimen. Also continued allopurinol and Valtrex. He is not currently on outpatient Levaquin, but would recommend considering PO vanc in future for C. Diff ppx if he is placed on prophylactic antibiotics.   Code status:  he stated he would not want to have prolonged suffering and opted for DNR/DNI status on admission.  HTN: his daughter states that he no longer takes hydrochlorothiazide or Lasix outpatient. He was normotensive during admission without these meds, no changes made.    GERD: continued PPI  Recent cataract surgery: no changes made to his home eye drops    The patient's hospital stay has been complicated by the following clinically significant conditions requiring additional evaluation and treatment or having a significant effect of this patient's care: - Anemia POA requiring further investigation or monitoring  - Hypokalemia POA requiring further investigation, treatment, or monitoring    Nutrition Assessment: Non-severe (Moderate) Protein-Calorie Malnutrition in the context of acute illness or injury (06/17/21 1129)  Energy Intake: < or equal to 50% of estimated energy requirement for > or equal to 5 days  Fat Loss: Moderate  Muscle Loss: Mild  Malnutrition Score: 3      Outpatient Provider Follow Up Issues:   [ ]  He should have follow up within the week to check on his diarrheal frequency and assess need for more than 10 days of fidaxomicin  ??  [ ]  BMP/Mg/Phos should be checked within the next week to assess whether he needs continued potassium repletion (discharged on 20 meq daily for 7 days)  ??  [ ]  In the event that he is placed on Levaquin for prophylaxis in the future, would consider PO vancomycin for C diff ppx  ??  [ ]  Patient denied taking hydrochlorothiazide or Lasix recently, and these remained discontinued at discharge, since he remained normotensive.    Touchbase with Outpatient Provider:  Warm Handoff: Completed on 06/20/21 by Starleen Blue  (Intern) via [facility] Message    Procedures:  None  ______________________________________________________________________  Discharge Medications:      Your Medication List      STOP taking these medications    furosemide 20 MG tablet  Commonly known as: LASIX     hydroCHLOROthiazide 12.5 mg capsule  Commonly known as: MICROZIDE        START taking these medications    fidaxomicin 200 mg tablet  Commonly known as: DIFICID  Take 1 tablet (200 mg total) by mouth every twelve (12) hours for 9 days.     potassium chloride 20 MEQ ER tablet  Take 1 tablet (20 mEq total) by mouth daily for 7 days. Follow up with primary care doctor about lab checks for potassium in the next week.        CONTINUE taking these medications    allopurinol 300 MG tablet  Commonly known as: ZYLOPRIM  Take 1 tablet (300 mg total) by mouth daily.     melatonin 10 mg Cap  Take 10 mg by mouth nightly.     ofloxacin 0.3 % ophthalmic solution  Commonly known as: OCUFLOX  Apply to eye. Use as directed for cataract surgery.     pantoprazole 40 MG tablet  Commonly known as: PROTONIX  Take 1 tablet (40 mg total) by mouth Two (2) times a day.     prednisoLONE acetate (PF) 1 % Drps  Apply to eye. Use as directed for cataract surgery.     prochlorperazine 10 MG tablet  Commonly known as: COMPAZINE  Take 1 tablet (10 mg total) by mouth every six (6) hours as needed for nausea.     traZODone 50 MG tablet  Commonly known as: DESYREL  Take 50 mg by mouth nightly.     valACYclovir  500 MG tablet  Commonly known as: VALTREX  Take 500 mg by mouth daily.     VENCLEXTA 100 mg tablet  Generic drug: venetoclax  Take 4 tablets (400 mg total) by mouth daily for 21 days of each cycle.            Allergies:  Patient has no known allergies.  ______________________________________________________________________  Pending Test Results:  Pending Labs     Order Current Status    Type and Screen Collected (06/17/21 1610)    Blood Culture Preliminary result    Blood Culture Preliminary result          Most Recent Labs:  All lab results last 24 hours -   Recent Results (from the past 24 hour(s))   Basic Metabolic Panel    Collection Time: 06/19/21  2:52 PM   Result Value Ref Range    Sodium 138 135 - 145 mmol/L    Potassium 3.2 (L) 3.4 - 4.8 mmol/L    Chloride 104 98 - 107 mmol/L    CO2 27.0 20.0 - 31.0 mmol/L    Anion Gap 7 5 - 14 mmol/L    BUN 7 (L) 9 - 23 mg/dL    Creatinine 9.60 4.54 - 1.10 mg/dL    BUN/Creatinine Ratio 12     eGFR CKD-EPI (2021) Male >90 >=60 mL/min/1.64m2    Glucose 115 70 - 179 mg/dL    Calcium 8.7 8.7 - 09.8 mg/dL   Magnesium Level    Collection Time: 06/19/21  2:52 PM   Result Value Ref Range    Magnesium 1.8 1.6 - 2.6 mg/dL   Type and Screen subsequent    Collection Time: 06/20/21  3:49 AM   Result Value Ref Range    ABO Grouping B POS     Antibody Screen NEG    Basic Metabolic Panel    Collection Time: 06/20/21  3:49 AM   Result Value Ref Range    Sodium 139 135 - 145 mmol/L    Potassium 3.3 (L) 3.4 - 4.8 mmol/L Chloride 106 98 - 107 mmol/L    CO2 29.0 20.0 - 31.0 mmol/L    Anion Gap 4 (L) 5 - 14 mmol/L    BUN 5 (L) 9 - 23 mg/dL    Creatinine 1.19 1.47 - 1.10 mg/dL    BUN/Creatinine Ratio 8     eGFR CKD-EPI (2021) Male >90 >=60 mL/min/1.31m2    Glucose 101 70 - 179 mg/dL    Calcium 8.5 (L) 8.7 - 10.4 mg/dL   Magnesium Level    Collection Time: 06/20/21  3:49 AM   Result Value Ref Range    Magnesium 1.8 1.6 - 2.6 mg/dL   Phosphorus Level    Collection Time: 06/20/21  3:49 AM   Result Value Ref Range    Phosphorus 2.1 (L) 2.4 - 5.1 mg/dL   Uric acid    Collection Time: 06/20/21  3:49 AM   Result Value Ref Range    Uric Acid 2.7 (L) 3.7 - 9.2 mg/dL   Lactate dehydrogenase    Collection Time: 06/20/21  3:49 AM   Result Value Ref Range    LDH 234 120 - 246 U/L   CBC w/ Differential    Collection Time: 06/20/21  3:49 AM   Result Value Ref Range    WBC 4.5 3.6 - 11.2 10*9/L    RBC 3.35 (L) 4.26 - 5.60 10*12/L    HGB 9.4 (L) 12.9 - 16.5 g/dL  HCT 28.8 (L) 39.0 - 48.0 %    MCV 86.0 77.6 - 95.7 fL    MCH 28.2 25.9 - 32.4 pg    MCHC 32.8 32.0 - 36.0 g/dL    RDW 16.1 (H) 09.6 - 15.2 %    MPV 9.2 6.8 - 10.7 fL    Platelet 145 (L) 150 - 450 10*9/L    Neutrophils % 73.6 %    Lymphocytes % 15.4 %    Monocytes % 8.1 %    Eosinophils % 2.4 %    Basophils % 0.5 %    Absolute Neutrophils 3.3 1.8 - 7.8 10*9/L    Absolute Lymphocytes 0.7 (L) 1.1 - 3.6 10*9/L    Absolute Monocytes 0.4 0.3 - 0.8 10*9/L    Absolute Eosinophils 0.1 0.0 - 0.5 10*9/L    Absolute Basophils 0.0 0.0 - 0.1 10*9/L    Anisocytosis Marked (A) Not Present   Morphology Review    Collection Time: 06/20/21  3:49 AM   Result Value Ref Range    Smear Review Comments See Comment (A) Undefined    Giant Platelets Present (A) Not Present    Dohle Bodies Present (A) Not Present    Hypersegmented Neutrophils Present (A) Not Present    Poikilocytosis Moderate (A) Not Present     Microbiology -   Microbiology Results (last day)     Procedure Component Value Date/Time Date/Time    Blood Culture [[id number]]  (Normal) Collected: 06/16/21 2130    Lab Status: Preliminary result Specimen: Blood from 1 Peripheral Draw Updated: 06/19/21 2145     Blood Culture, Routine No Growth at 72 hours    Blood Culture [[id number]]  (Normal) Collected: 06/16/21 2130    Lab Status: Preliminary result Specimen: Blood from 1 Peripheral Draw Updated: 06/19/21 2145     Blood Culture, Routine No Growth at 72 hours            Relevant Studies/Radiology:  ECG 12 Lead    Result Date: 06/16/2021  SINUS TACHYCARDIA OTHERWISE NORMAL ECG WHEN COMPARED WITH ECG OF 25-Apr-2021 15:27, NO SIGNIFICANT CHANGE WAS FOUND Confirmed by Eldred Manges ([id number]) on 06/16/2021 11:42:40 PM    XR Chest 2 views    Result Date: 06/16/2021  EXAM: XR CHEST 2 VIEWS DATE: 06/16/2021 11:28 PM ACCESSION: [id number] UN DICTATED: 06/16/2021 11:32 PM INTERPRETATION LOCATION: Main [facility] CLINICAL INDICATION: 81 years old Male with FEVER  TECHNIQUE: PA and Lateral Chest Radiographs. COMPARISON: Chest radiograph 04/25/2021 FINDINGS: Low lung volumes accentuate the pulmonary vasculature and interstitium. No focal lung consolidation. No pleural effusion or pneumothorax. Unremarkable cardiomediastinal silhouette. Elevated right hemidiaphragm. Degenerative changes of the thoracic spine.     Elevated right hemidiaphragm. No focal consolidation.    ______________________________________________________________________  Discharge Instructions:   Activity Instructions     Activity as tolerated            Follow Up instructions and Outpatient Referrals     Call MD for:  difficulty breathing, headache or visual disturbances      Call MD for:  persistent nausea or vomiting      Call MD for:  severe uncontrolled pain      Call MD for:  temperature >38.5 Celsius      Discharge instructions          Appointments which have been scheduled for you    Jun 22, 2021  3:30 PM  Appointment  [address], [place] [address]    Hospital Follow Up w/ Marcelina Morel, FNP  Jul 01, 2021  8:00 AM  (Arrive by 7:45 AM)  RETURN  GENERAL with Delsa Sale, MD  [facility] REGION) [address]  [place] [address]  [phone]      Jul 07, 2021  7:30 AM  (Arrive by 7:00 AM)  LAB ONLY [place] with ADULT ONC LAB  [facility] ADULT ONCOLOGY LAB DRAW STATION [facility] REGION) [address]  [place] [address]  [phone]      Jul 07, 2021  8:30 AM  (Arrive by 8:00 AM)  RETURN ACTIVE [place] with Avie Arenas, MD  [facility] HEMATOLOGY ONCOLOGY 2ND FLR CANCER HOSP [facility] REGION) [address]  [place] [address]  [phone]           ______________________________________________________________________  Discharge Day Services:  BP 113/68  - Pulse 103  - Temp 36.7 ??C (98.1 ??F)  - Resp 16  - Ht 186.2 cm (6' 1.31)  - Wt (!) 125.7 kg (277 lb 3.2 oz)  - SpO2 100%  - BMI 36.27 kg/m??     Pt seen on the day of discharge and determined appropriate for discharge.    Condition at Discharge: stable    Length of Discharge: I spent greater than 30 mins in the discharge of this patient.

## 2021-06-22 ENCOUNTER — Ambulatory Visit (INDEPENDENT_AMBULATORY_CARE_PROVIDER_SITE_OTHER): Payer: Medicare Other | Admitting: Nurse Practitioner

## 2021-06-22 ENCOUNTER — Encounter: Payer: Self-pay | Admitting: Nurse Practitioner

## 2021-06-22 VITALS — BP 130/76 | HR 98 | Temp 98.4°F | Resp 16 | Ht 74.5 in | Wt 286.4 lb

## 2021-06-22 DIAGNOSIS — Z09 Encounter for follow-up examination after completed treatment for conditions other than malignant neoplasm: Secondary | ICD-10-CM | POA: Diagnosis not present

## 2021-06-22 DIAGNOSIS — E876 Hypokalemia: Secondary | ICD-10-CM

## 2021-06-22 DIAGNOSIS — A0472 Enterocolitis due to Clostridium difficile, not specified as recurrent: Secondary | ICD-10-CM

## 2021-06-22 DIAGNOSIS — G4700 Insomnia, unspecified: Secondary | ICD-10-CM | POA: Diagnosis not present

## 2021-06-22 MED ORDER — BELSOMRA 15 MG PO TABS: 15.0000 mg | ORAL_TABLET | Freq: Every evening | ORAL | 2 refills | Status: AC | PRN

## 2021-06-22 MED ORDER — BELSOMRA 10 MG PO TABS: 10.0000 mg | ORAL_TABLET | Freq: Every evening | ORAL | 2 refills | Status: DC | PRN

## 2021-06-22 NOTE — Progress Notes (Signed)
[facility] [address], [address] [place] [address] [facility] Discharge Acute Issues Care Follow Up                                                                        Patient Demographics  Samuel Frank., is a 81 y.o. male  DOB 1940-12-08  MRN [id number].  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[place])    Dyspnea    Hypertension     Past Surgical History:  Procedure Laterality Date   COLONOSCOPY N/A 08/28/2019   Procedure: COLONOSCOPY;  Surgeon: Toledo, Benay Pike, MD;  Location: [facility] ENDOSCOPY;  Service: Gastroenterology;  Laterality: N/A;   COLONOSCOPY WITH PROPOFOL N/A 05/03/2015   Procedure: COLONOSCOPY WITH PROPOFOL;  Surgeon: [name], MD;  Location: [facility] ENDOSCOPY;  Service: Gastroenterology;  Laterality: N/A;   ESOPHAGOGASTRODUODENOSCOPY N/A 08/28/2019   Procedure: ESOPHAGOGASTRODUODENOSCOPY (EGD);  Surgeon: Toledo, Benay Pike, MD;  Location: [facility] ENDOSCOPY;  Service: Gastroenterology;  Laterality: N/A;   ESOPHAGOGASTRODUODENOSCOPY (EGD) WITH PROPOFOL N/A 05/03/2015   Procedure: ESOPHAGOGASTRODUODENOSCOPY (EGD) WITH PROPOFOL;  Surgeon: [name], MD;  Location: [facility] ENDOSCOPY;  Service: Gastroenterology;  Laterality: N/A;   HERNIA REPAIR     1975       Recent HPI and [place] Hospital course: Samuel Frank is a 81 y.o. male with HTN, GERD, and AML on azacitidine/venetoclax (on C3) who presented to [facility] with fever and watery diarrhea, found to be positive for C. Diff.   On arrival to ER, he was tachycardic to 120s with temp to 38.1. He had normal ANC (>3) and unremarkable CXR and UA. He was given fluid resuscitation and started on broad spectrum antibiotics (vanc and  cefepime) initially.   On further discussion, he endorsed several weeks of watery diarrhea, and he tested positive for C. Diff. He was started on fidaxomicin. Broad spectrum antibiotics were stopped due to no growth on initial blood cultures and no other evidence of infection.  By day of discharge, his symptoms were improved and he had been afebrile for >48 hours. He denied abdominal pain, was tolerating PO, and was able to ambulate without issue. PT/OT evaluated with no needs identified. He will be discharged home with plan to complete total of 10 day course of PO fidaxomicin.   Other hospital problems addressed as below:  Hypokalemia: required repletion inpatient and will be discharged on PO Kcl 20 meq daily with recommendation for repeat BMP/Mg/Phos within the week. 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 Follows with Dr. Janese Banks in [place]. Given his clinical stability, no changes made to this regimen. Also continued allopurinol and Valtrex. He is not currently on outpatient Levaquin, but  would recommend considering PO vanc in future for C. Diff ppx if he is placed on prophylactic antibiotics.  Code status: he stated he would not want to have prolonged suffering and opted for DNR/DNI status on admission. HTN: his daughter states that he no longer takes hydrochlorothiazide or Lasix outpatient. He was normotensive during admission without these meds, no changes made.  GERD: continued PPI Recent cataract surgery: no changes made to his home eye drops  The patient's hospital stay has been complicated by the following clinically significant conditions requiring additional evaluation and treatment or having a significant effect of this patient's care: - Anemia POA requiring further investigation or monitoring - Hypokalemia POA requiring further investigation, treatment, or monitoring  Nutrition Assessment:    Non-severe (Moderate) Protein-Calorie Malnutrition in the context of acute illness or injury (06/17/21 1129) Energy Intake: < or equal to 50% of estimated energy requirement for > or equal to 5 days Fat Loss: Moderate Muscle Loss: Mild Malnutrition Score: 3   Post Hospital Acute Care Issue to be followed in the Clinic   Principal Problem: C. 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   Assessment & Plan    1. Hospital discharge follow-up Repeat labs ordered as recommended  - Basic Metabolic Panel (BMET) - Magnesium - Phosphorus  2. Mixed insomnia Belsomra prescribed. Sample provided.  - Suvorexant (BELSOMRA) 15 MG TABS; Take 15 mg by mouth at bedtime as needed.  Dispense: 30 tablet; Refill: 2  3. Hypokalemia Repeat labs ordered.  - Basic Metabolic Panel (BMET) - Magnesium - Phosphorus  4. C. difficile diarrhea Treated in the hospital, diarrhea has resolved. Will call the clinic if it recurs or worsens.    Reason for frequent admissions/ER visits    Sepsis, hypokalemia, CML in remission, insomnia, hypertension and prediabetes.     Objective:   Vitals:   06/22/21 1452  BP: 130/76  Pulse: (!) 101  Resp: 16  Temp: 98.4 F (36.9 C)  SpO2: 97%  Weight: 286 lb 6.4 oz (129.9 kg)  Height: 6' 2.5" (1.892 m)    Wt Readings from Last 3 Encounters:  06/22/21 286 lb 6.4 oz (129.9 kg)  06/13/21 286 lb (129.7 kg)  06/13/21 285 lb (129.3 kg)    Allergies as of 06/22/2021   No Known Allergies       Medication List        Accurate as of June 22, 2021  3:45 PM. If you have any questions, ask your nurse or doctor.          STOP taking these medications    furosemide 20 MG tablet Commonly known as: LASIX Stopped by: Jonetta Osgood, NP   hydrochlorothiazide 12.5 MG capsule Commonly known as: MICROZIDE Stopped by: Jonetta Osgood, NP   traZODone 50 MG tablet Commonly known as: DESYREL Stopped by: Jonetta Osgood, NP       TAKE these medications    allopurinol 300 MG tablet Commonly known as: ZYLOPRIM Take 1 tablet (300 mg total) by mouth daily.   Belsomra 15 MG Tabs Generic drug: Suvorexant Take 15 mg  by mouth at bedtime as needed. Started by: Jonetta Osgood, NP   ferrous sulfate 325 (65 FE) MG tablet Take 325 mg by mouth daily.   pantoprazole 40 MG tablet Commonly known as: PROTONIX TAKE 1 TABLET BY MOUTH EVERY DAY   potassium chloride SA 20 MEQ tablet Commonly known as: K[place]-Con M20 Take 1 tablet (20 mEq total) by mouth 2 (two) times daily.   prochlorperazine 10 MG tablet Commonly known as: COMPAZINE Take 1 tablet (10 mg total) by mouth every 6 (six) hours as needed.   tranexamic acid 650 MG Tabs tablet Commonly known as: LYSTEDA Take 1,300 mg by mouth 3 (three) times daily.   valACYclovir 500 MG tablet Commonly known as: VALTREX Take 1 tablet (500 mg total) by mouth daily.   venetoclax 100 MG tablet Commonly known as: VENCLEXTA Take 400 mg by mouth daily. Take for 21 days, then hold for 7 days. Repeat every 28 days.         Physical Exam: Constitutional: Patient appears well-developed and well-nourished. Not in obvious distress. HENT: Normocephalic, atraumatic, External right and left ear normal. Oropharynx is clear and moist.  Eyes: Conjunctivae and EOM are normal. PERRLA, no scleral icterus. Neck: Normal ROM. Neck supple. No JVD. No tracheal deviation. No thyromegaly. CVS: RRR, S1/S2 +, no murmurs, no gallops, no carotid bruit.   Pulmonary: Effort and breath sounds normal, no stridor, rhonchi, wheezes, rales.  Abdominal: Soft. BS +, no distension, tenderness, rebound or guarding.  Musculoskeletal: Normal range of motion. No edema and no tenderness.  Lymphadenopathy: No lymphadenopathy noted, cervical, inguinal or axillary Neuro: Alert. Normal reflexes, muscle tone coordination. No cranial nerve deficit. Skin: Skin is warm and dry. No rash noted. Not diaphoretic. No erythema. No pallor. Psychiatric: Normal mood and affect. Behavior, judgment, thought content normal.   Data Review   Micro Results No results found for this or any previous visit (from the past 240 hour(s)).   CBC No results for input(s): WBC, HGB, HCT, PLT, MCV, MCH, MCHC, RDW, LYMPHSABS, MONOABS, EOSABS, BASOSABS, BANDABS in the last 168 hours.  Invalid input(s): NEUTRABS, BANDSABD  Chemistries  No results for input(s): NA, K, CL, CO2, GLUCOSE, BUN, CREATININE, CALCIUM, MG, AST, ALT, ALKPHOS, BILITOT in the last 168 hours.  Invalid input(s): GFRCGP ------------------------------------------------------------------------------------------------------------------ estimated creatinine clearance is 102.7 mL/min (by C-G formula based on SCr of 0.7 mg/dL). ------------------------------------------------------------------------------------------------------------------ No results for input(s): HGBA1C in the last 72 hours. ------------------------------------------------------------------------------------------------------------------ No results for input(s): CHOL, HDL, LDLCALC, TRIG, CHOLHDL, LDLDIRECT in the last 72 hours. ------------------------------------------------------------------------------------------------------------------ No results for input(s): TSH, T4TOTAL, T3FREE, THYROIDAB in the last 72 hours.  Invalid input(s):  FREET3 ------------------------------------------------------------------------------------------------------------------ No results for input(s): VITAMINB12, FOLATE, FERRITIN, TIBC, IRON, RETICCTPCT in the last 72 hours.  Coagulation profile No results for input(s): INR, PROTIME in the last 168 hours.  No results for input(s): DDIMER in the last 72 hours.  Cardiac Enzymes No results for input(s): CKMB, TROPONINI, MYOGLOBIN in the last 168 hours.  Invalid input(s): CK ------------------------------------------------------------------------------------------------------------------ Invalid input(s): POCBNP  Return for general follow up in 2-3 months.   Time Spent in minutes  45 Time spent with patient included reviewing progress notes, labs, imaging studies, and discussing plan for follow up.   [place] Controlled Substance Database was reviewed by me for overdose risk score (ORS)  This patient was seen by Jonetta Osgood, FNP-C in collaboration with Dr. Clayborn Bigness as a part of collaborative care agreement.    Jonetta Osgood MSN, FNP-C on 06/22/2021 at 3:45 PM   **  Disclaimer: This note may have been dictated with voice recognition software. Similar sounding words can inadvertently be transcribed and this note may contain transcription errors which may not have been corrected upon publication of note.**

## 2021-06-24 DIAGNOSIS — E876 Hypokalemia: Secondary | ICD-10-CM | POA: Diagnosis not present

## 2021-06-24 DIAGNOSIS — Z09 Encounter for follow-up examination after completed treatment for conditions other than malignant neoplasm: Secondary | ICD-10-CM | POA: Diagnosis not present

## 2021-06-25 ENCOUNTER — Encounter (INDEPENDENT_AMBULATORY_CARE_PROVIDER_SITE_OTHER): Payer: Self-pay | Admitting: Nurse Practitioner

## 2021-06-25 ENCOUNTER — Encounter: Payer: Self-pay | Admitting: Oncology

## 2021-06-25 LAB — BASIC METABOLIC PANEL
BUN/Creatinine Ratio: 11 (ref 10–24)
BUN: 7 mg/dL — ABNORMAL LOW (ref 8–27)
CO2: 25 mmol/L (ref 20–29)
Calcium: 8.6 mg/dL (ref 8.6–10.2)
Chloride: 102 mmol/L (ref 96–106)
Creatinine, Ser: 0.65 mg/dL — ABNORMAL LOW (ref 0.76–1.27)
Glucose: 106 mg/dL — ABNORMAL HIGH (ref 70–99)
Potassium: 4.3 mmol/L (ref 3.5–5.2)
Sodium: 139 mmol/L (ref 134–144)
eGFR: 94 mL/min/{1.73_m2} (ref >59)

## 2021-06-25 LAB — PHOSPHORUS: Phosphorus: 2.8 mg/dL (ref 2.8–4.1)

## 2021-06-25 LAB — MAGNESIUM: Magnesium: 1.9 mg/dL (ref 1.6–2.3)

## 2021-06-25 NOTE — Progress Notes (Signed)
? ?Subjective:  ? ? Patient ID: Samuel Cacciola., male    DOB: [birth year], 81 y.o.   MRN: [id number] ?Chief Complaint  ?Patient presents with  ? Follow-up  ?  ultrasound  ? ? ?Samuel Rayl. is a 81 y.o. male.   The patient had blistering and some areas of wounds previously on both lower extremities.  This has been going on for many months.  He notes that the swelling is better than when he was previously seen.  He has no strength in his right hand and cannot get regular compression socks on.  He does try to elevate his legs some.  He is fairly sedentary.  No fevers or chills.  He has been previously checked for DVT and has not had a DVT and has no previous history of DVT to his knowledge.  He does have anemia and leukemia.   ? 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  No evidence of DVT or superficial thrombophlebitis bilaterally.  No evidence of superficial venous reflux bilaterally.  No evidence of reflux in the left lower extremity. ? ? ?Review of Systems  ?Cardiovascular:  Positive for leg swelling.  ?All other systems reviewed and are negative. ? ?   ?Objective:  ? Physical Exam ?Vitals reviewed.  ?HENT:  ?   Head: Normocephalic.  ?Cardiovascular:  ?   Rate and Rhythm: Normal rate.  ?Pulmonary:  ?   Effort: Pulmonary effort is normal.  ?Musculoskeletal:  ?   Right lower leg: Edema present.  ?   Left lower leg: Edema present.  ?Skin: ?   General: Skin is warm and dry.  ?Neurological:  ?   Mental Status: He is alert and oriented to person, place, and time.  ?Psychiatric:     ?   Mood and Affect: Mood normal.     ?   Behavior: Behavior normal.     ?   Thought Content: Thought content normal.     ?   Judgment: Judgment normal.  ? ? ?BP 123/74 (BP Location: Left Arm)   Pulse 93   Resp 17   Ht 6' 2.5" (1.892 m)   Wt 285 lb (129.3 kg)   BMI 36.10 kg/m?  ? ?Past Medical History:  ?Diagnosis Date  ? Anemia   ? CMML (chronic myelomonocytic leukemia)  ([place])   ? CMML (chronic myelomonocytic leukemia) ([place])   ? Dyspnea   ? Hypertension   ? ? ?Social History  ? ?Socioeconomic History  ? Marital status: Divorced  ?  Spouse name: Not on file  ? Number of children: 4  ? Years of education: Not on file  ? Highest education level: Not on file  ?Occupational History  ? Occupation: retired  ?  Comment: [employer]  ?Tobacco Use  ? Smoking status: Never  ? Smokeless tobacco: Never  ?Vaping Use  ? Vaping Use: Never used  ?Substance and Sexual Activity  ? Alcohol use: No  ? Drug use: No  ? Sexual activity: Not Currently  ?Other Topics Concern  ? Not on file  ?Social History Narrative  ? Not on file  ? ?Social Determinants of Health  ? ?Financial Resource Strain: Low Risk   ? Difficulty of Paying Living Expenses: Not very hard  ?Food Insecurity: Not on file  ?Transportation Needs: Not on file  ?Physical Activity: Not on file  ?Stress: Not on file  ?Social Connections: Not on file  ?  Intimate Partner Violence: Not on file  ? ? ?Past Surgical History:  ?Procedure Laterality Date  ? COLONOSCOPY N/A 08/28/2019  ? Procedure: COLONOSCOPY;  Surgeon: Toledo, Benay Pike, MD;  Location: [facility] ENDOSCOPY;  Service: Gastroenterology;  Laterality: N/A;  ? COLONOSCOPY WITH PROPOFOL N/A 05/03/2015  ? Procedure: COLONOSCOPY WITH PROPOFOL;  Surgeon: [name], MD;  Location: [facility] ENDOSCOPY;  Service: Gastroenterology;  Laterality: N/A;  ? ESOPHAGOGASTRODUODENOSCOPY N/A 08/28/2019  ? Procedure: ESOPHAGOGASTRODUODENOSCOPY (EGD);  Surgeon: Toledo, Benay Pike, MD;  Location: [facility] ENDOSCOPY;  Service: Gastroenterology;  Laterality: N/A;  ? ESOPHAGOGASTRODUODENOSCOPY (EGD) WITH PROPOFOL N/A 05/03/2015  ? Procedure: ESOPHAGOGASTRODUODENOSCOPY (EGD) WITH PROPOFOL;  Surgeon: [name], MD;  Location: [facility] ENDOSCOPY;  Service: Gastroenterology;  Laterality: N/A;  ? HERNIA REPAIR    ? 1975  ? ? ?Family History  ?Problem Relation Age of Onset  ? Alzheimer's disease Father   ? Lung cancer Sister   ?  Cancer Sister   ? ? ?No Known Allergies ? ? ?  Latest Ref Rng & Units 06/13/2021  ?  9:13 AM 06/07/2021  ?  8:20 AM 05/23/2021  ?  7:58 AM  ?CBC  ?WBC 4.0 - 10.5 K/uL 2.9   4.7   3.8    ?Hemoglobin 13.0 - 17.0 g/dL 10.5   10.0   8.9    ?Hematocrit 39.0 - 52.0 % 34.2   32.7   28.6    ?Platelets 150 - 400 K/uL 391   367   128    ? ? ? ? ?CMP  ?   ?Component Value Date/Time  ? NA 139 06/24/2021 0755  ? K 4.3 06/24/2021 0755  ? CL 102 06/24/2021 0755  ? CO2 25 06/24/2021 0755  ? GLUCOSE 106 (H) 06/24/2021 0755  ? GLUCOSE 109 (H) 06/13/2021 0913  ? BUN 7 (L) 06/24/2021 0755  ? CREATININE 0.65 (L) 06/24/2021 0755  ? CALCIUM 8.6 06/24/2021 0755  ? PROT 7.4 06/13/2021 0913  ? PROT 7.4 07/22/2018 0811  ? ALBUMIN 3.5 06/13/2021 0913  ? ALBUMIN 4.1 07/22/2018 0811  ? AST 13 (L) 06/13/2021 0913  ? ALT 10 06/13/2021 0913  ? ALKPHOS 71 06/13/2021 0913  ? BILITOT 0.6 06/13/2021 0913  ? BILITOT 0.3 07/22/2018 0811  ? GFRNONAA >60 06/13/2021 0913  ? GFRAA >60 11/24/2019 0806  ? ? ? ?No results found. ? ?   ?Assessment & Plan:  ? ?1. Swelling of limb ?Recommend: ? ?No surgery or intervention at this point in time. ? ?I have reviewed my discussion with the patient regarding venous insufficiency and why it causes symptoms. I have discussed with the patient the chronic skin changes that accompany venous insufficiency and the long term sequela such as ulceration. Patient will contnue wearing graduated compression stockings on a daily basis, as this has provided excellent control of his edema. The patient will put the stockings on first thing in the morning and removing them in the evening. The patient is reminded not to sleep in the stockings.  If the compression stockings are too difficult to wear we have also recommended compression wraps. ? ?In addition, behavioral modification including elevation during the day will be initiated. ?Exercise is strongly encouraged. ? ?We discussed a lymphedema pump however the patient wishes to move  forward with compression at this time.  We will plan on having the patient follow-up in 1 year or sooner if the swelling becomes out of control once again. ?2. Essential hypertension, benign ?Continue antihypertensive medications as already  ordered, these medications have been reviewed and there are no changes at this time.  ? ?3. Polio ?May contribute to difficulty with placing compression socks. ? ? ?Current Outpatient Medications on File Prior to Visit  ?Medication Sig Dispense Refill  ? allopurinol (ZYLOPRIM) 300 MG tablet Take 1 tablet (300 mg total) by mouth daily. 90 tablet 2  ? ferrous sulfate 325 (65 FE) MG tablet Take 325 mg by mouth daily.    ? pantoprazole (PROTONIX) 40 MG tablet TAKE 1 TABLET BY MOUTH EVERY DAY 90 tablet 1  ? potassium chloride SA (KLOR-CON M20) 20 MEQ tablet Take 1 tablet (20 mEq total) by mouth 2 (two) times daily. 180 tablet 1  ? prochlorperazine (COMPAZINE) 10 MG tablet Take 1 tablet (10 mg total) by mouth every 6 (six) hours as needed. 30 tablet 1  ? tranexamic acid (LYSTEDA) 650 MG TABS tablet Take 1,300 mg by mouth 3 (three) times daily.    ? valACYclovir (VALTREX) 500 MG tablet Take 1 tablet (500 mg total) by mouth daily. 90 tablet 1  ? venetoclax (VENCLEXTA) 100 MG tablet Take 400 mg by mouth daily. Take for 21 days, then hold for 7 days. Repeat every 28 days.    ? ?No current facility-administered medications on file prior to visit.  ? ? ?There are no Patient Instructions on file for this visit. ?No follow-ups on file. ? ? ?Kris Hartmann, NP ? ? ?

## 2021-06-27 ENCOUNTER — Telehealth: Payer: Self-pay | Admitting: *Deleted

## 2021-06-27 ENCOUNTER — Other Ambulatory Visit: Payer: Self-pay | Admitting: *Deleted

## 2021-06-27 DIAGNOSIS — C9310 Chronic myelomonocytic leukemia not having achieved remission: Secondary | ICD-10-CM

## 2021-06-27 DIAGNOSIS — A0472 Enterocolitis due to Clostridium difficile, not specified as recurrent: Secondary | ICD-10-CM

## 2021-06-27 NOTE — Telephone Encounter (Signed)
Called patient today because he was in [facility] from 4 13-4 17 with C. Difficile.  Patient says he has no more diarrhea and he has 2 more pills to take of his antibiotic.  I told him that on discharge they recommended that he come and see [place] 1 day this week to get a met be magnesium and phosphorus to make sure all those numbers are good.  Patient agreeable to come this Wednesday 4 26-8 45.  And when the labs come back we will call him and let him know if there is anything to be done based on the lab work.  Patient agreeable to plan and will be here ?

## 2021-06-29 ENCOUNTER — Telehealth: Payer: Self-pay | Admitting: *Deleted

## 2021-06-29 ENCOUNTER — Inpatient Hospital Stay: Payer: Medicare Other

## 2021-06-29 DIAGNOSIS — Z79899 Other long term (current) drug therapy: Secondary | ICD-10-CM | POA: Diagnosis not present

## 2021-06-29 DIAGNOSIS — A0472 Enterocolitis due to Clostridium difficile, not specified as recurrent: Secondary | ICD-10-CM

## 2021-06-29 DIAGNOSIS — D509 Iron deficiency anemia, unspecified: Secondary | ICD-10-CM

## 2021-06-29 DIAGNOSIS — Z5111 Encounter for antineoplastic chemotherapy: Secondary | ICD-10-CM | POA: Diagnosis not present

## 2021-06-29 DIAGNOSIS — C9310 Chronic myelomonocytic leukemia not having achieved remission: Secondary | ICD-10-CM

## 2021-06-29 DIAGNOSIS — C9201 Acute myeloblastic leukemia, in remission: Secondary | ICD-10-CM | POA: Diagnosis not present

## 2021-06-29 LAB — CBC WITH DIFFERENTIAL/PLATELET
Abs Immature Granulocytes: 0.03 10*3/uL (ref 0.00–0.07)
Basophils Absolute: 0 10*3/uL (ref 0.0–0.1)
Basophils Relative: 1 %
Eosinophils Absolute: 0.1 10*3/uL (ref 0.0–0.5)
Eosinophils Relative: 2 %
HCT: 34.2 % — ABNORMAL LOW (ref 39.0–52.0)
Hemoglobin: 10.5 g/dL — ABNORMAL LOW (ref 13.0–17.0)
Immature Granulocytes: 1 %
Lymphocytes Relative: 30 %
Lymphs Abs: 0.9 10*3/uL (ref 0.7–4.0)
MCH: 28 pg (ref 26.0–34.0)
MCHC: 30.7 g/dL (ref 30.0–36.0)
MCV: 91.2 fL (ref 80.0–100.0)
Monocytes Absolute: 0.6 10*3/uL (ref 0.1–1.0)
Monocytes Relative: 21 %
Neutro Abs: 1.4 10*3/uL — ABNORMAL LOW (ref 1.7–7.7)
Neutrophils Relative %: 45 %
Platelets: 171 10*3/uL (ref 150–400)
RBC: 3.75 MIL/uL — ABNORMAL LOW (ref 4.22–5.81)
RDW: 25.6 % — ABNORMAL HIGH (ref 11.5–15.5)
WBC: 3.1 10*3/uL — ABNORMAL LOW (ref 4.0–10.5)
nRBC: 0 % (ref 0.0–0.2)

## 2021-06-29 LAB — PHOSPHORUS: Phosphorus: 3.5 mg/dL (ref 2.5–4.6)

## 2021-06-29 LAB — BASIC METABOLIC PANEL
Anion gap: 5 (ref 5–15)
BUN: 9 mg/dL (ref 8–23)
CO2: 28 mmol/L (ref 22–32)
Calcium: 8.5 mg/dL — ABNORMAL LOW (ref 8.9–10.3)
Chloride: 100 mmol/L (ref 98–111)
Creatinine, Ser: 0.63 mg/dL (ref 0.61–1.24)
GFR, Estimated: >60 mL/min (ref >60)
Glucose, Bld: 99 mg/dL (ref 70–99)
Potassium: 4.5 mmol/L (ref 3.5–5.1)
Sodium: 133 mmol/L — ABNORMAL LOW (ref 135–145)

## 2021-06-29 LAB — MAGNESIUM: Magnesium: 2 mg/dL (ref 1.7–2.4)

## 2021-06-29 NOTE — Telephone Encounter (Signed)
Called pt and let him know that his labs are good. So at this time you do not need anything extra. He states still no more diarrhea. He will be coming on 5/8. I told him to bring his pill of the venetoclax in the office . That way if labs are good he will take it the pill and if labs not good then he will not take it. Pt says that's good with him ?

## 2021-06-29 NOTE — Unmapped (Addendum)
Pre called but no answer. Left message with call back instructions.

## 2021-06-30 LAB — SAMPLE TO BLOOD BANK

## 2021-06-30 NOTE — Progress Notes (Signed)
All labs are stable, follow up as needed

## 2021-07-01 ENCOUNTER — Ambulatory Visit
Admit: 2021-07-01 | Discharge: 2021-07-02 | Payer: MEDICARE | Attending: Student in an Organized Health Care Education/Training Program | Primary: Student in an Organized Health Care Education/Training Program

## 2021-07-01 DIAGNOSIS — K922 Gastrointestinal hemorrhage, unspecified: Secondary | ICD-10-CM | POA: Diagnosis not present

## 2021-07-01 NOTE — Unmapped (Addendum)
It was a pleasure to see you today!  If you start bleeding again, please call me or go to the ED in [facility]. I will also work on talking with our colleagues at [facility] to see if there are other options.    Important phone numbers:  Nurse Contact: Noe Gens   p: [phone] - f: [phone]    GI Clinic Appointments:  [phone] option 1 for appointments, then option 1 for clinic appointments.   Please call the GI Clinic appointment line if you need to schedule, reschedule or cancel an appointment in clinic. They can also answer any questions you may have about where your appointment is located and when you need to arrive.         GI Procedure Appointments:  [phone] option 1 for appointments, then option 2 for procedure appointments.   Please call the GI Procedures line if you need to schedule, reschedule or cancel any type of GI procedure (endoscopy, colonoscopy, motility testing, etc).  You can also call this number for prep instructions, etc.         Radiology: [phone]   If you are being scheduled for any type of radiology study, you will need to call to receive your appointment time.  Please call this number for information.          For emergencies after normal business hours or on weekends/holidays   Proceed to the nearest emergency room OR contact the [facility] Operator at [phone] who can page the Gastroenterology Fellow on call

## 2021-07-01 NOTE — Unmapped (Signed)
[facility] Gastroenterology  Initial Consultation Visit         Assessment and Recommendations:   Gary Baird is a 81 y.o. 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 Discussed with patient different options regarding recurrent GIB, which would include repeat capsule endoscopy, referral to [facility] for consideration of double balloon endoscopy/retrograde balloon, medical treatment of AVMs (octreotide, bevacizumab) versus a watch and wait approach. The patient preferred to watch and wait. His bleeding has not been life threatening in the past and has always self resolved. We discussed the importance of going to [facility] if he develops recurrent bleeding.    #C diff  Recent admission. S/p PO vanco course. Symptoms improved. First occurrence.      This patient was seen and examined with Dr. Anne Shutter who agrees with the plan. The patient will be seen in clinic in 67mo    Subjective:   HPI:  Gary Baird is a 81 y.o. 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    I first met Mr. Dec when he was admitted from 11/26-12/1/22 with 1w BRBPR at [facility]. He underwent EGD, colonoscopy and VCE during that hospitalization, without an obvious source of bleeding or intervention. 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    He was admitted again from 1/7-1/15/23 with 1d BRBPR. 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 Because of this he underwent a single balloon enteroscopy on 03/17/21. No AVMs or active bleeding were seen. The distal extent of the small bowel reached was tattooed and the patient was discharged.    He presents to clinic today feeling well. He has not had any bleeding since January. He gets his Hgb checked regularly via his Onc team. However, he has been admitted for other issues, from 2/20-2/23/23 w/ febrile neutropenia and from 4/13-4/17/23 w/ C diff. He just completed his 14d course of oral vanco. He is not having diarrhea.    He adds the history today that he thinks he has had on and off BRBPR since the 1980s. At first it would happen yearly or every couple of years. More recently it has occurred more frequently and the episodes have involved more blood.    [name] was born and raised in [place]. 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 He remains active and reports despite all his health issues he feels well and can do most of the activities he wants to do.      Allergies:  Patient has no known allergies.    Medications:   Prior to Admission medications    Medication Dose, Route, Frequency   allopurinol (ZYLOPRIM) 300 MG tablet 300 mg, Oral, Daily (standard)   BELSOMRA 15 mg tablet    ferrous sulfate 325 (65 FE) MG tablet 325 mg, Oral, Daily (standard)   melatonin 10 mg cap 10 mg, Oral, Nightly   pantoprazole (  PROTONIX) 40 MG tablet 40 mg, Oral, 2 times a day (standard)   prednisoLONE acetate, PF, 1 % DrpS Ophthalmic, Use as directed for cataract surgery.   prochlorperazine (COMPAZINE) 10 MG tablet 10 mg, Oral, Every 6 hours PRN   tranexamic acid 650 mg Tab tablet 1,300 mg, Oral, 3 times a day (standard)   traZODone (DESYREL) 50 MG tablet 50 mg, Oral, Nightly   valACYclovir (VALTREX) 500 MG tablet 500 mg, Oral, Daily (standard)   venetoclax (VENCLEXTA) 100 mg tablet Take 4 tablets (400 mg total) by mouth daily for 21 days of each cycle.     Medical History:  Past Medical History:   Diagnosis Date    Cancer (CMS-HCC)     Hypertension Leukemia (CMS-HCC)      Surgical History:  Past Surgical History:   Procedure Laterality Date    HERNIA REPAIR  1975    PR COLONOSCOPY FLX DX W/COLLJ SPEC WHEN PFRMD N/A 02/02/2021    Procedure: COLONOSCOPY, FLEXIBLE, PROXIMAL TO SPLENIC FLEXURE; DIAGNOSTIC, W/WO COLLECTION SPECIMEN BY BRUSH OR WASH;  Surgeon: Mayford Knife, MD;  [facility]: [facility];  Service: Gastroenterology    PR ENDOSCOPY UPPER SMALL INTESTINE N/A 03/17/2021    Procedure: SMALL INTESTINAL ENDOSCOPY, ENTEROSCOPY BEYOND SECOND PORTION OF DUODENUM, NOT INCL ILEUM; DX, INCL COLLECTION OF SPECIMEN(S) BY BRUSHING OR WASHING, WHEN PERFORMED;  Surgeon: Vonda Antigua, MD;  Location: GI [facility];  Service: Gastroenterology    PR GASTROINTESTINAL TRACT IMAGING ESOPHAGUS W/I&R N/A 02/02/2021    Procedure: GI PATENCY TRACT IMAGING, INTRALUMINAL (EG, CAPSULE ENDOSCOPY), ESOPHAGUS W/PHYSICIAN INTERP/REPORT;  Surgeon: Mayford Knife, MD;  Location: GI [facility];  Service: Gastroenterology    PR GI IMAG INTRALUMINAL ESOPHAGUS-ILEUM W/I&R N/A 03/15/2021    Procedure: GI VIDEO TRACT IMAGE INTRALUMINAL (EG, CAPSULE ENDOSCOPY), ESOPHAGUS VIA ILEUM, PHYSICIAN INTERPRETATION & REPORT;  Surgeon: Monte Fantasia, MD;  Location: GI [facility];  Service: Gastroenterology    PR UPPER GI ENDOSCOPY,DIAGNOSIS N/A 02/02/2021    Procedure: UGI ENDO, INCLUDE ESOPHAGUS, STOMACH, & DUODENUM &/OR JEJUNUM; DX W/WO COLLECTION SPECIMN, BY BRUSH OR WASH;  Surgeon: Mayford Knife, MD;  [facility]: [facility];  Service: Gastroenterology    SKIN BIOPSY       Social History:  Social History     Tobacco Use    Smoking status: Never    Smokeless tobacco: Never   Vaping Use    Vaping Use: Never used   Substance Use Topics    Alcohol use: Not Currently    Drug use: Never       Family History:  Family History   Problem Relation Age of Onset    Alzheimer's disease Father     Melanoma Neg Hx     Basal cell carcinoma Neg Hx     Squamous cell carcinoma Neg Hx      Review of Systems:  10 systems were reviewed and are negative unless otherwise mentioned in the HPI    Objective:   Physical Exam:  Vitals:    07/01/21 0813   BP: 117/61   BP Site: R Arm   BP Position: Sitting   BP Cuff Size: Large   Pulse: 96   Weight: (!) 128 kg (282 lb 1.6 oz)   Height: 189.2 cm (6' 2.5)     Body mass index is 35.73 kg/m??.  Gen: NAD, well appearing  Eyes: sclera anicteric, EOMI  HENT: atraumatic, MMM  Heart: non-tachycardic  Lungs:  no respiratory distress  Abdomen: Normoactive bowel sounds, soft, NT/ND, no rebound/guarding  Extremities: no clubbing, cyanosis, or edema in the BLEs  Neuro: Alert, appropriate mood and affect. No focal findings.    Labs/Studies:  Labs, studies, and imaging reviewed in Epic.

## 2021-07-07 ENCOUNTER — Encounter
Admit: 2021-07-07 | Discharge: 2021-07-08 | Payer: MEDICARE | Attending: Hematology & Oncology | Primary: Hematology & Oncology

## 2021-07-07 ENCOUNTER — Ambulatory Visit
Admit: 2021-07-07 | Discharge: 2021-07-08 | Payer: MEDICARE | Attending: Hematology & Oncology | Primary: Hematology & Oncology

## 2021-07-07 ENCOUNTER — Other Ambulatory Visit: Admit: 2021-07-07 | Discharge: 2021-07-08 | Payer: MEDICARE

## 2021-07-07 DIAGNOSIS — C9201 Acute myeloblastic leukemia, in remission: Principal | ICD-10-CM

## 2021-07-07 LAB — CBC W/ AUTO DIFF
BASOPHILS ABSOLUTE COUNT: 0.1 10*9/L (ref 0.0–0.1)
BASOPHILS RELATIVE PERCENT: 2.9 %
EOSINOPHILS ABSOLUTE COUNT: 0 10*9/L (ref 0.0–0.5)
EOSINOPHILS RELATIVE PERCENT: 1.6 %
HEMATOCRIT: 32.9 % — ABNORMAL LOW (ref 39.0–48.0)
HEMOGLOBIN: 10.6 g/dL — ABNORMAL LOW (ref 12.9–16.5)
LYMPHOCYTES ABSOLUTE COUNT: 0.8 10*9/L — ABNORMAL LOW (ref 1.1–3.6)
LYMPHOCYTES RELATIVE PERCENT: 30.4 %
MEAN CORPUSCULAR HEMOGLOBIN CONC: 32.2 g/dL (ref 32.0–36.0)
MEAN CORPUSCULAR HEMOGLOBIN: 28.2 pg (ref 25.9–32.4)
MEAN CORPUSCULAR VOLUME: 87.6 fL (ref 77.6–95.7)
MEAN PLATELET VOLUME: 8.9 fL (ref 6.8–10.7)
MONOCYTES ABSOLUTE COUNT: 0.1 10*9/L — ABNORMAL LOW (ref 0.3–0.8)
MONOCYTES RELATIVE PERCENT: 3.6 %
NEUTROPHILS ABSOLUTE COUNT: 1.6 10*9/L — ABNORMAL LOW (ref 1.8–7.8)
NEUTROPHILS RELATIVE PERCENT: 61.5 %
PLATELET COUNT: 263 10*9/L (ref 150–450)
RED BLOOD CELL COUNT: 3.75 10*12/L — ABNORMAL LOW (ref 4.26–5.60)
RED CELL DISTRIBUTION WIDTH: 27.3 % — ABNORMAL HIGH (ref 12.2–15.2)
WBC ADJUSTED: 2.6 10*9/L — ABNORMAL LOW (ref 3.6–11.2)

## 2021-07-07 LAB — COMPREHENSIVE METABOLIC PANEL
ALBUMIN: 3.1 g/dL — ABNORMAL LOW (ref 3.4–5.0)
ALKALINE PHOSPHATASE: 96 U/L (ref 46–116)
ALT (SGPT): <7 U/L — ABNORMAL LOW (ref 10–49)
ANION GAP: 4 mmol/L — ABNORMAL LOW (ref 5–14)
AST (SGOT): 11 U/L (ref <=34)
BILIRUBIN TOTAL: 0.5 mg/dL (ref 0.3–1.2)
BLOOD UREA NITROGEN: 8 mg/dL — ABNORMAL LOW (ref 9–23)
BUN / CREAT RATIO: 12
CALCIUM: 8.9 mg/dL (ref 8.7–10.4)
CHLORIDE: 107 mmol/L (ref 98–107)
CO2: 29 mmol/L (ref 20.0–31.0)
CREATININE: 0.68 mg/dL
EGFR CKD-EPI (2021) MALE: >90 mL/min/{1.73_m2} (ref >=60)
GLUCOSE RANDOM: 99 mg/dL (ref 70–179)
POTASSIUM: 3.8 mmol/L (ref 3.4–4.8)
PROTEIN TOTAL: 7 g/dL (ref 5.7–8.2)
SODIUM: 140 mmol/L (ref 135–145)

## 2021-07-07 LAB — SLIDE REVIEW

## 2021-07-07 NOTE — Unmapped (Signed)
I saw and evaluated the patient, and participated in the key portions of the service.  This is a highly complex patient which requires my evaluation and assessment. I reviewed the housestaff note below and agree with the plan. Additional findings are as follows:    Gary Baird is a  81 y.o. 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 He began treatment on Aza/Ven on 02/14/2021.  He was found to be in MLFS after cycle 1 without MRD.  He has completed 4 cycles of treatment and presents today for follow up and cycle 5 consideration.    Gary Baird presents doing well today. Since last seen in clinic he was admitted to the hospital from 4/13-4/17 for fever and ongoing diaherra which was found to be positive for C. Diff. He was discharged on a 10 day course of PO fidaxomicin. Otherwise, he has been tolerating treatment with Aza/Ven quite well with minimal complications or toxicities. On lab review today his counts are stable with ANC 1.6, hgb 10.6, and plts 263K. He is scheduled to start cycle 5 on 5/8 locally with [name]. As long as ANC >/= 1.0 and plt >/= 50 he should be able to proceed with cycle 5 with azacitidine 75mg /m2 x 7 days + venetoclax 400mg  x 21 days.  He should continue valtrex for ppx. He will return to clinic on 6/1 just prior to cycle 6.     COVID ppx: 3x COVID mRNA vaccine doses.    ECOG PS: 1    Plan and Recommendations  1) Proceed with cycle 5 of azacitidine 75mg /m2 x 5 days + venetoclax 400mg  x 21 days on 5/8 with [name] as long as ANC >/= 1.0 and plt >/= 50.   2) Continue valtrex  3) RTC on 6/1 for f/u just prior to cycle 6  _______________________________________________________________    Documentation assistance was provided by Roosevelt Locks, Scribe, on Jul 07, 2021 at 8:30 AM for Jimmye Norman, MD.     Documentation assistance provided by Medical Scribe, Roosevelt Locks, who was present during the entirety of the visit. I reviewed the note below and validated all of the information provided to ensure accuracy and completeness.     Cindra Presume, MD      Oncologic History:  Oncology History Overview Note       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)    Presentation: Incidentally found to have elevated WBC prior to procedure    Labs- WBC = 25.2  Hb = 8.2 g/dL  Platelets = 846  96% monocytes    Bone marrow biopsy- 09/04/2018  Hypercellular marrow- 70-90% cellular, 7% blasts by manual aspirate differential- monocytic appearance, granulocytic dysplasia, dysplasia of megakaryocytes    Final Diagnosis   Date Value Ref Range Status   09/04/2018   Final    (Outside Case #:  [id number], dated 09/04/2018)  Bone marrow,  aspiration and biopsy  -  Hypercellular bone marrow (90%) involved by chronic myelomonocytic leukemia-1 (9% blasts by manual aspirate differential) (see Comment)  -  Unifocal paratrabecular cluster of atypical CD17-positive cells (see Comment)  -  By outside report, cytogenetic analysis reveals a normal karyotype.  -  By outside report, next generation sequencing for myeloid disorders reveals pathogenic or likely pathogenic variants in CBL (p.C384Y), SRSF2 (p.P95H), TET2 (W.[id number]*64), and SH2B3 (Q.[id number]*95).    Peripheral blood, smear review  -  Leukocytosis with absolute monocytosis  -  Normocytic anemia  -  Thrombocytosis    This electronic signature is attestation that the pathologist personally reviewed the submitted material(s) and the final diagnosis reflects that evaluation.         Flow cytometry: No myeloblasts detectable, 16% monocytes- HLA-DR, CD11b, CD13, CD14, CD33, CD38, CD56, CD64 and without CD34 expression     Cytogenetics = Normal, 46,XY    Mutational Panel:   CBL C384Y- 19.0% VAF  SH2B3 S256Qfs- 58.8% VAF  SRSF2 P95H- 47.1% VAF  TET2 R1440- 86% VAF    Treatment: Azacitidine 75mg /m2 daily for 5 days monthly (09/30/18--12/31/20)        2. Diagnosis: Secondary AML     Developed hematochezia, fatigue, blurry vision, DOE at end of Nov 2022.  WBC 136, Hb 5.4, Pl 313, peripheral blasts 8%      Diagnosis   Date Value Ref Range Status   01/31/2021   Final    Bone marrow, right iliac, aspiration and biopsy  -  Hypercellular bone marrow (90%) consistent with involvement by acute myeloid leukemia (53% blasts by manual aspirate differential)  -  See linked reports for associated Ancillary Studies.      This electronic signature is attestation that the pathologist personally reviewed the submitted material(s) and the final diagnosis reflects that evaluation.          RESULTS   Date Value Ref Range Status   01/31/2021   Preliminary    Normal Karyotype: 46,XY[10]    Normal FISH:  An interphase FISH assay shows no evidence of a rearrangement involving the KMT2A (MLL) gene region in the 200 nuclei scored.            Myeloid Panel:   Variants of Known/Likely Clinical Significance:   Gene Coding Predicted Protein Variant allele fraction   ASXL1 c.2158del p.(Asp720Thrfs*5) 42.7 %   SRSF2 c.284C>A p.(Pro95His) 45.5 %   TET2 c.4317dupA p.(Arg1440Thrfs*38) 90.4 %     Cycle 1: 02/14/2021  Azacitidine 75mg /m2 x 7 days  Venetoclax 400mg  x 28 days    BMBx 03/08/21   Bone marrow, right iliac, aspiration and biopsy  -  Limited bone marrow sampling with increased megakaryocytes and less than 1% blasts on touch prep differential count (see Comment)   -  See linked reports for associated Ancillary Studies.    Cycle 2: 03/21/2021  Azacitidine 75mg /m2 x 7 days   Venetoclax 400mg  x 21 days    Cycle 3: 05/11/2021  Delayed due to neutropenia  Azacitidine 75 mg/m2 x 5 days  Venetoclax 400 mg x 21 days    Cycle 4: 06/13/2021  Azacitidine 75 mg/m2 x 5 days  Venetoclax 400 mg x 21 days       CMML (chronic myelomonocytic leukemia) (CMS-HCC)   09/19/2018 Initial Diagnosis    CMML (chronic myelomonocytic leukemia) (CMS-HCC)     Acute myeloid leukemia in remission (CMS-HCC)   02/04/2021 Initial Diagnosis    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)     02/14/2021 - Chemotherapy    OP AML AZACITIDINE + VENETOCLAX  azacitidine 75 mg/m2 SQ on days 1-7, venetoclax ramp up Week 1 (dose dependent), then azacitidine 75 mg/m2 SQ on Days 1-7 every 28 days      Chemotherapy    OP AML AZACITIDINE + VENETOCLAX      azacitidine 75 mg/m2 SQ on days 1-7, venetoclax ramp up Week 1 (dose dependent), then azacitidine 75 mg/m2 SQ on Days 1-7 every 28 days       Cindra Presume, MD

## 2021-07-07 NOTE — Unmapped (Signed)
Labs drawn via butterfly & sent for analysis.  To next appt.  Care provided by Daniel Maxwell RN.

## 2021-07-07 NOTE — Unmapped (Addendum)
Please hold your venetoclax until the start of your next cycle on Monday, 07/11/21.  __________________________________________________________    It was a pleasure to see you today.  For appointments & questions or new symptoms or health related issues, Monday through Friday 8 AM--5 PM   Please call [phone] or Toll free [phone]. OR send [place] a message via myUNCchart.     For urgent clinical needs on Nights, Weekends or Holidays  Call [phone]  for the [facility] Nurse [employer].   ( Spanish speaking patients should call: [phone]    For appointment changes please contact during normal business hours.     Here are some resources that might be helpful:  Geophysical data processor Program  [email or web address].org/ccsp/   PrivacyFever.cz, a resource created just for family members and caregivers.  This website lists support services, how and where to ask for help. It has tools to assist you as you help [place] care for your loved one.  Get Real and Heel   program  [email or web address].org/getrealandheel/    [facility]  [address]  [place], [place] [address]  [email or web address]    Here are your labs from today's visit   Lab on 07/07/2021   Component Date Value    ABO Grouping 07/07/2021 B POS     Antibody Screen 07/07/2021 NEG     Sodium 07/07/2021 140     Potassium 07/07/2021 3.8     Chloride 07/07/2021 107     CO2 07/07/2021 29.0     Anion Gap 07/07/2021 4 (L)     BUN 07/07/2021 8 (L)     Creatinine 07/07/2021 0.68     BUN/Creatinine Ratio 07/07/2021 12     eGFR CKD-EPI (2021) Male 07/07/2021 >90     Glucose 07/07/2021 99     Calcium 07/07/2021 8.9     Albumin 07/07/2021 3.1 (L)     Total Protein 07/07/2021 7.0     Total Bilirubin 07/07/2021 0.5     AST 07/07/2021 11     ALT 07/07/2021 <7 (L)     Alkaline Phosphatase 07/07/2021 96     WBC 07/07/2021 2.6 (L)     RBC 07/07/2021 3.75 (L)     HGB 07/07/2021 10.6 (L)     HCT 07/07/2021 32.9 (L)     MCV 07/07/2021 87.6     MCH 07/07/2021 28.2     MCHC 07/07/2021 32.2     RDW 07/07/2021 27.3 (H)     MPV 07/07/2021 8.9     Platelet 07/07/2021 263     Neutrophils % 07/07/2021 61.5     Lymphocytes % 07/07/2021 30.4     Monocytes % 07/07/2021 3.6     Eosinophils % 07/07/2021 1.6     Basophils % 07/07/2021 2.9     Absolute Neutrophils 07/07/2021 1.6 (L)     Absolute Lymphocytes 07/07/2021 0.8 (L)     Absolute Monocytes 07/07/2021 0.1 (L)     Absolute Eosinophils 07/07/2021 0.0     Absolute Basophils 07/07/2021 0.1     Anisocytosis 07/07/2021 Marked (A)     Smear Review Comments 07/07/2021 See Comment (A)     Giant Platelets 07/07/2021 Present (A)

## 2021-07-07 NOTE — Unmapped (Signed)
[facility] Follow-up     Patient Name: Gary Baird  Patient Age:81 y.o.  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 He was on monthly azacitidine for two years (since 09/2018) for management of his CMML, last given 12/30/20. He developed new GI bleeding and worsening leukocytosis and was admitted to [facility] for management on 01/29/21. BMbx identified progression to AML with 53% blasts, normal cytogenetics, and mutations in ASXL1, SRSF2, and TET2.  He began treatment on Aza/Ven on 02/14/2021.  He was found to be in MLFS after cycle 1 without MRD.  His cycle 2 was delayed due to cytopenias, but he has otherwise completed 4 cycles to treatment and presents today for follow up.    Mr. [name] had a hospitalization in mid April due to fever and diarrheas, and was found to be positive for c diff. He completed a course of fidaxomicin and he feels well today. He has otherwise tolerated treatment with aza/ven quite well with minimal complications or toxicities. As long as ANC >/= 1.0 and plt >/= 50 he should be able to proceed with cycle 3 with azacitidine 75mg /m2 x 7 days + venetoclax 400mg  x 21 days.  If his counts continue to drop and he does not meet treatment parameters on 2/13 then we recommend delaying treatment until hematologic recovery and then resume with dose reduced azacitidine 75mg /m2 x 5 days + venetoclax 400mg  x 21 days.  I would recommend he continue to have weekly lab monitoring to monitor hgb.  If this continues to be stable then we could decrease frequency.  He should continue valtrex for ppx.  I will plan to see him back in clinic in 4 weeks just prior to cycle 4.     Other problems:  GI bleed- Admitted 01/2021 for GI bleed at dx of AML.  Again admitted in 03/2021 for hematochezia/melena.  He was also noted to have thrombocytosis at that time that was thought to be reactive in nature.  Workup during most recent hospitalization did not reveal source of bleeding.  Since then his hgb has remained stable.     COVID ppx- 3x COVID mRNA vaccine doses.      Plan and Recommendations:  - Proceed with cycle 5 of azacitidine 75mg /m2 x 7 days + venetoclax 400mg  x 21 days on 2/13 with [name] as long as ANC >/= 1.0 and plt >/= 50.   - if ANC is <1.0 and/or plt < 50, treatment should be delayed and resumed upon count recovery with DR'd azacitidine 75mg /m2 x 5 days + venetoclax 400mg  x 21 day.   - Continue valtrex  - RTC in 4 weeks with APP    Gary Baird (PGY-6)  Pediatric and Adult Hematology Fellow     I personally spent 50 minutes face-to-face and non-face-to-face in the care of this patient, which includes all pre, intra, and post visit time on the date of service.  All documented time was specific to the E/M visit and does not include any procedures that may have been performed.    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)    Presentation: Incidentally found to have elevated WBC prior to procedure    Labs- WBC = 25.2  Hb = 8.2 g/dL  Platelets = 161  09% monocytes    Bone marrow biopsy- 09/04/2018  Hypercellular marrow- 70-90% cellular, 7% blasts by manual aspirate differential- monocytic appearance, granulocytic dysplasia, dysplasia of megakaryocytes    Final Diagnosis   Date Value Ref Range Status   09/04/2018   Final    (Outside Case #:  [id number], dated 09/04/2018)  Bone marrow,  aspiration and biopsy  -  Hypercellular bone marrow (90%) involved by chronic myelomonocytic leukemia-1 (9% blasts by manual aspirate differential) (see Comment)  -  Unifocal paratrabecular cluster of atypical CD17-positive cells (see Comment)  -  By outside report, cytogenetic analysis reveals a normal karyotype.  -  By outside report, next generation sequencing for myeloid disorders reveals pathogenic or likely pathogenic variants in CBL (p.C384Y), SRSF2 (p.P95H), TET2 (J.Y7829FAO*13), and SH2B3 (Y.Q657QIO*96).    Peripheral blood, smear review  -  Leukocytosis with absolute monocytosis  -  Normocytic anemia  -  Thrombocytosis    This electronic signature is attestation that the pathologist personally reviewed the submitted material(s) and the final diagnosis reflects that evaluation.         Flow cytometry: No myeloblasts detectable, 16% monocytes- HLA-DR, CD11b, CD13, CD14, CD33, CD38, CD56, CD64 and without CD34 expression     Cytogenetics = Normal, 46,XY    Mutational Panel:   CBL C384Y- 19.0% VAF  SH2B3 S256Qfs- 58.8% VAF  SRSF2 P95H- 47.1% VAF  TET2 R1440- 86% VAF    Treatment: Azacitidine 75mg /m2 daily for 5 days monthly (09/30/18--12/31/20)        2. Diagnosis: Secondary AML     Developed hematochezia, fatigue, blurry vision, DOE at end of Nov 2022.  WBC 136, Hb 5.4, Pl 313, peripheral blasts 8%      Diagnosis   Date Value Ref Range Status   01/31/2021   Final    Bone marrow, right iliac, aspiration and biopsy  -  Hypercellular bone marrow (90%) consistent with involvement by acute myeloid leukemia (53% blasts by manual aspirate differential)  -  See linked reports for associated Ancillary Studies.      This electronic signature is attestation that the pathologist personally reviewed the submitted material(s) and the final diagnosis reflects that evaluation.          RESULTS   Date Value Ref Range Status   01/31/2021   Preliminary    Normal Karyotype: 46,XY[10]    Normal FISH:  An interphase FISH assay shows no evidence of a rearrangement involving the KMT2A (MLL) gene region in the 200 nuclei scored.            Myeloid Panel:   Variants of Known/Likely Clinical Significance:   Gene Coding Predicted Protein Variant allele fraction   ASXL1 c.2158del p.(Asp720Thrfs*5) 42.7 %   SRSF2 c.284C>A p.(Pro95His) 45.5 %   TET2 c.4317dupA p.(Arg1440Thrfs*38) 90.4 %     Cycle 1: 02/14/2021  Azacitidine 75mg /m2 x 7 days  Venetoclax 400mg  x 28 days    BMBx 03/08/21   Bone marrow, right iliac, aspiration and biopsy  -  Limited bone marrow sampling with increased megakaryocytes and less than 1% blasts on touch prep differential count (see Comment)   -  See linked reports for associated Ancillary Studies.    Cycle 2: 03/21/2021  Azacitidine 75mg /m2 x 7 days   Venetoclax 400mg  x 21 days  Cycle 3: 05/11/2021  Delayed due to neutropenia  Azacitidine 75 mg/m2 x 5 days  Venetoclax 400 mg x 21 days    Cycle 4: 06/13/2021  Azacitidine 75 mg/m2 x 5 days  Venetoclax 400 mg x 21 days       CMML (chronic myelomonocytic leukemia) (CMS-HCC)   09/19/2018 Initial Diagnosis    CMML (chronic myelomonocytic leukemia) (CMS-HCC)     Acute myeloid leukemia in remission (CMS-HCC)   02/04/2021 Initial Diagnosis    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)     02/14/2021 -  Chemotherapy    OP AML AZACITIDINE + VENETOCLAX  azacitidine 75 mg/m2 SQ on days 1-7, venetoclax ramp up Week 1 (dose dependent), then azacitidine 75 mg/m2 SQ on Days 1-7 every 28 days      Chemotherapy    OP AML AZACITIDINE + VENETOCLAX      azacitidine 75 mg/m2 SQ on days 1-7, venetoclax ramp up Week 1 (dose dependent), then azacitidine 75 mg/m2 SQ on Days 1-7 every 28 days       Interval History:  Since last seen Mr. Torbeck has been feeling well.  He reports that he has tolerated his treatment with aza/ven well with minimal side effects.  He denies any n/v/d/abdominal pain.  Reports good energy and good appetite.  No recent fevers/chills, sob/cough.  No new complaints today.     Otherwise, he denies new constitutional symptoms such as anorexia, weight loss, night sweats or unexplained fevers.  Furthermore, he denies symptoms of marrow failure: unexplained bleeding or bruising, recurrent or unexplained intercurrent infections, dyspnea on exertion, lightheadedness, palpitations or chest pain.  There have been no new or unexplained pains or self-identified masses, swelling or enlarged lymph nodes.     Past Medical, Surgical and Family History were reviewed and pertinent updates were made in the Electronic Medical Record     Review of Systems:  Other than as reported above in the interim history, the balance of a full 12-system review was performed and unremarkable.     ECOG Performance Status: 1    Allergies: No Known Allergies    Current Medications:  Current Outpatient Medications   Medication Sig Dispense Refill   ??? allopurinol (ZYLOPRIM) 300 MG tablet Take 1 tablet (300 mg total) by mouth daily. 30 tablet 2   ??? BELSOMRA 15 mg tablet      ??? ferrous sulfate 325 (65 FE) MG tablet Take 1 tablet (325 mg total) by mouth daily.     ??? melatonin 10 mg cap Take 1 capsule (10 mg total) by mouth nightly.     ??? pantoprazole (PROTONIX) 40 MG tablet Take 1 tablet (40 mg total) by mouth Two (2) times a day. 60 tablet 0   ??? prednisoLONE acetate, PF, 1 % DrpS Apply to eye. Use as directed for cataract surgery.     ??? prochlorperazine (COMPAZINE) 10 MG tablet Take 1 tablet (10 mg total) by mouth every six (6) hours as needed for nausea. 30 tablet 2   ??? tranexamic acid 650 mg Tab tablet Take 2 tablets (1,300 mg total) by mouth Three (3) times a day.     ??? traZODone (DESYREL) 50 MG tablet Take 1 tablet (50 mg total) by mouth nightly.     ??? valACYclovir (VALTREX) 500 MG tablet Take 1 tablet (500 mg total) by mouth daily.     ??? venetoclax (VENCLEXTA) 100 mg tablet Take 4 tablets (400 mg total) by mouth daily for 21 days of  each cycle. 84 tablet 5     No current facility-administered medications for this visit.     Physical Exam:   Vitals:    07/07/21 0815   BP: 126/59   Pulse: 86   Resp: 16   Temp: 36.6 ??C (97.9 ??F)   SpO2: 98%       Physical Exam:  General: Resting, in no apparent distress  HEENT:  PERRL. No scleral icterus or conjunctival injection.  Heart: RRR.  S1, S2.  No murmurs, gallops or rubs.  No edema noted  Lungs:  Breathing is unlabored, and patient is speaking full sentences with ease.  CTAB. No rales, ronchi or crackles.    Abdomen:  No distention or pain on palpation.  Bowel sounds are present.  Obese.  No palpable masses.  Skin:  No rashes, petechiae or purpura. Grossly intact.  Musculoskeletal: Range of motion about the shoulder, elbow, hips and knees is grossly normal.  Strength is equal bilaterally  Psychiatric:  Range of affect is appropriate.    Neurologic:  Alert and oriented x 4.  Steady gait.    Relevant Laboratory, radiology and pathology results:  Lab on 07/07/2021   Component Date Value Ref Range Status   ??? ABO Grouping 07/07/2021 B POS   Final   ??? Antibody Screen 07/07/2021 NEG   Final   ??? Sodium 07/07/2021 140  135 - 145 mmol/L Final   ??? Potassium 07/07/2021 3.8  3.4 - 4.8 mmol/L Final   ??? Chloride 07/07/2021 107  98 - 107 mmol/L Final   ??? CO2 07/07/2021 29.0  20.0 - 31.0 mmol/L Final   ??? Anion Gap 07/07/2021 4 (L)  5 - 14 mmol/L Final   ??? BUN 07/07/2021 8 (L)  9 - 23 mg/dL Final   ??? Creatinine 07/07/2021 0.68  0.60 - 1.10 mg/dL Final   ??? BUN/Creatinine Ratio 07/07/2021 12   Final   ??? eGFR CKD-EPI (2021) Male 07/07/2021 >90  >=60 mL/min/1.35m2 Final    eGFR calculated with CKD-EPI 2021 equation in accordance with [employer] and [employer] of Nephrology Task Force recommendations.   ??? Glucose 07/07/2021 99  70 - 179 mg/dL Final   ??? Calcium 16/12/9602 8.9  8.7 - 10.4 mg/dL Final   ??? Albumin 54/11/8117 3.1 (L)  3.4 - 5.0 g/dL Final   ??? Total Protein 07/07/2021 7.0  5.7 - 8.2 g/dL Final   ??? Total Bilirubin 07/07/2021 0.5  0.3 - 1.2 mg/dL Final   ??? AST 14/78/2956 11  <=34 U/L Final   ??? ALT 07/07/2021 <7 (L)  10 - 49 U/L Final   ??? Alkaline Phosphatase 07/07/2021 96  46 - 116 U/L Final   ??? WBC 07/07/2021 2.6 (L)  3.6 - 11.2 10*9/L Final   ??? RBC 07/07/2021 3.75 (L)  4.26 - 5.60 10*12/L Final   ??? HGB 07/07/2021 10.6 (L)  12.9 - 16.5 g/dL Final   ??? HCT 21/30/8657 32.9 (L)  39.0 - 48.0 % Final   ??? MCV 07/07/2021 87.6  77.6 - 95.7 fL Final   ??? MCH 07/07/2021 28.2  25.9 - 32.4 pg Final   ??? MCHC 07/07/2021 32.2  32.0 - 36.0 g/dL Final   ??? RDW 84/69/6295 27.3 (H)  12.2 - 15.2 % Final   ??? MPV 07/07/2021 8.9  6.8 - 10.7 fL Final   ??? Platelet 07/07/2021 263  150 - 450 10*9/L Final    Platelet Count greater than or equal to Preliminary Result, Confirmation  to follow.    ??? Neutrophils % 07/07/2021 61.5  % Final   ??? Lymphocytes % 07/07/2021 30.4  % Final   ??? Monocytes % 07/07/2021 3.6  % Final   ??? Eosinophils % 07/07/2021 1.6  % Final   ??? Basophils % 07/07/2021 2.9  % Final   ??? Absolute Neutrophils 07/07/2021 1.6 (L)  1.8 - 7.8 10*9/L Final   ??? Absolute Lymphocytes 07/07/2021 0.8 (L)  1.1 - 3.6 10*9/L Final   ??? Absolute Monocytes 07/07/2021 0.1 (L)  0.3 - 0.8 10*9/L Final   ??? Absolute Eosinophils 07/07/2021 0.0  0.0 - 0.5 10*9/L Final   ??? Absolute Basophils 07/07/2021 0.1  0.0 - 0.1 10*9/L Final   ??? Anisocytosis 07/07/2021 Marked (A)  Not Present Final   ??? Smear Review Comments 07/07/2021 See Comment (A)  Undefined Final    Slide reviewed.  [id number]   ??? Giant Platelets 07/07/2021 Present (A)  Not Present Final

## 2021-07-07 NOTE — Unmapped (Incomplete)
PACT Study Physical Therapy Assessments    PACT Study (IRB [phone])  Date: 07/07/2021   Start Time: 850   End Time: 915  Setting: Outpatient  Cycle #: 5      Patient Subjective & Discussion:   Gary Baird was seen with his Daughter, [name] today in clinic. He has been doing well, getting outside. 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 He has not been performing his exercises because he feels like he can do what he wants. I did encourage him to perform sit <> stand transfers to focus on strengthening his legs due to his heavy reliance on UE for transfers.     He also spends his time outdoors, tinkering, and just enjoying the freedom and feeling well enough to do whatever he would like to do. His Daughter also agreed that he's doing really well and denied concerns.          PT Assessments     AMPAC Short Form: 5 Click                Completed: Yes                                                                          Assistive device used during assessment  None       Item Score    Difficulty turning over in bed? 4   Difficulty sitting down/standing up from chair with arms? 4   Difficulty moving from supine to sitting on the edge of the bed? 4   Help moving to and from bed from wheelchair? 4   Help currently needed to walk? 4   Raw Score 20/20   T-Scale Score 56.83   CMS 0-100% Score 0%   AMPAC Assessment:          Timed Up & Go (TUG)       Completed: Yes                                                               Assistive device used during assessment None      Time  16.1 seconds   TUG Assessment:          Berg Balance Scale    Completed: Yes       Item Score   Sitting unsupported 4   Sitting to standing  3   Standing to sitting 3   Pivot transfers 3   Standing unsupported 4   Standing with eyes closed 4   Standing with feet together 4   Tandem standing  1   Turning to look behind  3   Retrieving object from floor 4   Turning 360?  2   Placing alternating feet on stool 3   Reaching forward with outstretched arms 4   Standing on one foot 1   Total Score 43/56   Berg Balance Scale Assessment:  He heavily relies on his arms for support for sit <> stand transfers        6 Minute Walk Test     Completed: Yes      Assistive device used during assessment None    Distance  183 meters   Pre-Vitals:  BP:   Post Vitals: BP:      HR:    HR:      SpO2:   SpO2:     RPE:  0  RPE:  2   Assessment:     Ambulated in clinic hallway at slow pace, antalgic gait due to reported stiffness in his right hip. Denied shortness of breath.      30 Second Sit to Stand    Completed: Yes      Repetitions  0 reps   30 Second Sit to Stand Assessment:    He heavily relies on his arms for support for sit <> stand transfers which is not allowed for this test.     PT Goals:     ***

## 2021-07-08 ENCOUNTER — Other Ambulatory Visit: Payer: Self-pay | Admitting: Internal Medicine

## 2021-07-08 DIAGNOSIS — R601 Generalized edema: Secondary | ICD-10-CM

## 2021-07-11 ENCOUNTER — Other Ambulatory Visit: Payer: Self-pay

## 2021-07-11 ENCOUNTER — Inpatient Hospital Stay: Payer: Medicare Other | Attending: Oncology

## 2021-07-11 ENCOUNTER — Encounter: Payer: Self-pay | Admitting: Oncology

## 2021-07-11 ENCOUNTER — Inpatient Hospital Stay: Payer: Medicare Other

## 2021-07-11 ENCOUNTER — Inpatient Hospital Stay (HOSPITAL_BASED_OUTPATIENT_CLINIC_OR_DEPARTMENT_OTHER): Payer: Medicare Other | Admitting: Oncology

## 2021-07-11 VITALS — BP 129/66 | HR 86 | Temp 98.3°F | Resp 20 | Wt 280.9 lb

## 2021-07-11 DIAGNOSIS — C9200 Acute myeloblastic leukemia, not having achieved remission: Secondary | ICD-10-CM | POA: Insufficient documentation

## 2021-07-11 DIAGNOSIS — Z79899 Other long term (current) drug therapy: Secondary | ICD-10-CM

## 2021-07-11 DIAGNOSIS — Z5111 Encounter for antineoplastic chemotherapy: Secondary | ICD-10-CM | POA: Insufficient documentation

## 2021-07-11 DIAGNOSIS — D509 Iron deficiency anemia, unspecified: Secondary | ICD-10-CM

## 2021-07-11 DIAGNOSIS — C9310 Chronic myelomonocytic leukemia not having achieved remission: Secondary | ICD-10-CM | POA: Diagnosis not present

## 2021-07-11 DIAGNOSIS — C9201 Acute myeloblastic leukemia, in remission: Secondary | ICD-10-CM

## 2021-07-11 LAB — CBC WITH DIFFERENTIAL/PLATELET
Abs Immature Granulocytes: 0.57 10*3/uL — ABNORMAL HIGH (ref 0.00–0.07)
Basophils Absolute: 0.2 10*3/uL — ABNORMAL HIGH (ref 0.0–0.1)
Basophils Relative: 4 %
Eosinophils Absolute: 0.1 10*3/uL (ref 0.0–0.5)
Eosinophils Relative: 2 %
HCT: 35.3 % — ABNORMAL LOW (ref 39.0–52.0)
Hemoglobin: 10.8 g/dL — ABNORMAL LOW (ref 13.0–17.0)
Immature Granulocytes: 13 %
Lymphocytes Relative: 29 %
Lymphs Abs: 1.3 10*3/uL (ref 0.7–4.0)
MCH: 27.9 pg (ref 26.0–34.0)
MCHC: 30.6 g/dL (ref 30.0–36.0)
MCV: 91.2 fL (ref 80.0–100.0)
Monocytes Absolute: 0.1 10*3/uL (ref 0.1–1.0)
Monocytes Relative: 3 %
Neutro Abs: 2.3 10*3/uL (ref 1.7–7.7)
Neutrophils Relative %: 49 %
Platelets: 288 10*3/uL (ref 150–400)
RBC: 3.87 MIL/uL — ABNORMAL LOW (ref 4.22–5.81)
RDW: 25.4 % — ABNORMAL HIGH (ref 11.5–15.5)
Smear Review: ADEQUATE
WBC: 4.6 10*3/uL (ref 4.0–10.5)
nRBC: 1.1 % — ABNORMAL HIGH (ref 0.0–0.2)

## 2021-07-11 LAB — COMPREHENSIVE METABOLIC PANEL
ALT: 9 U/L (ref 0–44)
AST: 14 U/L — ABNORMAL LOW (ref 15–41)
Albumin: 3.3 g/dL — ABNORMAL LOW (ref 3.5–5.0)
Alkaline Phosphatase: 75 U/L (ref 38–126)
Anion gap: 5 (ref 5–15)
BUN: 9 mg/dL (ref 8–23)
CO2: 26 mmol/L (ref 22–32)
Calcium: 8.5 mg/dL — ABNORMAL LOW (ref 8.9–10.3)
Chloride: 103 mmol/L (ref 98–111)
Creatinine, Ser: 0.72 mg/dL (ref 0.61–1.24)
GFR, Estimated: >60 mL/min (ref >60)
Glucose, Bld: 95 mg/dL (ref 70–99)
Potassium: 3.5 mmol/L (ref 3.5–5.1)
Sodium: 134 mmol/L — ABNORMAL LOW (ref 135–145)
Total Bilirubin: 0.5 mg/dL (ref 0.3–1.2)
Total Protein: 7.2 g/dL (ref 6.5–8.1)

## 2021-07-11 LAB — SAMPLE TO BLOOD BANK

## 2021-07-11 MED ORDER — FERROUS SULFATE 325 MG (65 MG IRON) TABLET
ORAL_TABLET | 1 refills | 0 days | Status: CP
Start: 2021-07-11 — End: ?

## 2021-07-11 MED ORDER — FERROUS SULFATE 325 (65 FE) MG PO TABS: 325.0000 mg | ORAL_TABLET | Freq: Every day | ORAL | 1 refills | Status: AC

## 2021-07-11 MED ORDER — AZACITIDINE CHEMO SQ INJECTION
75.0000 mg/m2 | Freq: Once | INTRAMUSCULAR | Status: AC
  Administered 2021-07-11: 200 mg via SUBCUTANEOUS
  Filled 2021-07-11: qty 8

## 2021-07-11 MED ORDER — ONDANSETRON HCL 4 MG PO TABS
8.0000 mg | ORAL_TABLET | Freq: Once | ORAL | Status: DC
  Filled 2021-07-11: qty 2

## 2021-07-11 MED ORDER — ONDANSETRON HCL 4 MG PO TABS
8.0000 mg | ORAL_TABLET | Freq: Once | ORAL | Status: AC
  Administered 2021-07-11: 8 mg via ORAL

## 2021-07-11 MED ORDER — ONDANSETRON HCL 4 MG PO TABS: 8.0000 mg | ORAL_TABLET | Freq: Once | ORAL | Status: DC

## 2021-07-11 NOTE — Patient Instructions (Signed)
[facility] [place]  Discharge Instructions: ?Thank you for choosing [facility] to provide your oncology and hematology care.  ?If you have a lab appointment with the [facility], please go directly to the [facility] and check in at the registration area. ? ?Wear comfortable clothing and clothing appropriate for easy access to any Portacath or PICC line.  ? ?We strive to give you quality time with your provider. You may need to reschedule your appointment if you arrive late (15 or more minutes).  Arriving late affects you and other patients whose appointments are after yours.  Also, if you miss three or more appointments without notifying the office, you may be dismissed from the clinic at the provider?s discretion.    ?  ?For prescription refill requests, have your pharmacy contact our office and allow 72 hours for refills to be completed.   ? ?Today you received the following chemotherapy and/or immunotherapy agents Vidaza    ?  ?To help prevent nausea and vomiting after your treatment, we encourage you to take your nausea medication as directed. ? ?BELOW ARE SYMPTOMS THAT SHOULD BE REPORTED IMMEDIATELY: ?*FEVER GREATER THAN 100.4 F (38 ?C) OR HIGHER ?*CHILLS OR SWEATING ?*NAUSEA AND VOMITING THAT IS NOT CONTROLLED WITH YOUR NAUSEA MEDICATION ?*UNUSUAL SHORTNESS OF BREATH ?*UNUSUAL BRUISING OR BLEEDING ?*URINARY PROBLEMS (pain or burning when urinating, or frequent urination) ?*BOWEL PROBLEMS (unusual diarrhea, constipation, pain near the anus) ?TENDERNESS IN MOUTH AND THROAT WITH OR WITHOUT PRESENCE OF ULCERS (sore throat, sores in mouth, or a toothache) ?UNUSUAL RASH, SWELLING OR PAIN  ?UNUSUAL VAGINAL DISCHARGE OR ITCHING  ? ?Items with * indicate a potential emergency and should be followed up as soon as possible or go to the Emergency Department if any problems should occur. ? ?Please show the CHEMOTHERAPY ALERT CARD or IMMUNOTHERAPY ALERT CARD at check-in to the  Emergency Department and triage nurse. ? ?Should you have questions after your visit or need to cancel or reschedule your appointment, please contact [facility] [place] [facility]  [phone] and follow the prompts.  Office hours are 8:00 a.m. to 4:30 p.m. Monday - Friday. Please note that voicemails left after 4:00 p.m. may not be returned until the following business day.  We are closed weekends and major holidays. You have access to a nurse at all times for urgent questions. Please call the main number to the clinic [phone] and follow the prompts. ? ?For any non-urgent questions, you may also contact your provider using MyChart. We now offer e-Visits for anyone 11 and older to request care online for non-urgent symptoms. For details visit [email or web address]. ?  ?Also download the MyChart app! Go to the app store, search "MyChart", open the app, select [facility], and log in with your MyChart username and password. ? ?Due to Covid, a mask is required upon entering the hospital/clinic. If you do not have a mask, one will be given to you upon arrival. For doctor visits, patients may have 1 support person aged 44 or older with them. For treatment visits, patients cannot have anyone with them due to current Covid guidelines and our immunocompromised population.  ?

## 2021-07-11 NOTE — Progress Notes (Signed)
? ? ? ?Hematology/Oncology Consult note ?[place]  ?[phone] [phone] ? ?Patient Care Team: ?Lavera Guise, MD as PCP - General (Internal Medicine) ?Sindy Guadeloupe, MD as Consulting Physician (Oncology) ?Edythe Clarity, [facility] as Pharmacist (Pharmacist)  ? ?Name of the patient: Samuel Frank  ?[id number]  ?Nov 21, 1940  ? ?Date of visit: 07/11/21 ? 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 ?Chief complaint/ Reason for visit-on treatment assessment prior to cycle 4 of Vidaza ? ?Heme/Onc history: Patient is a 81 yr old male with no significant co morbidities other than hypertension.  He has recently been having bilateral knee pain and has undergone steroid injection in his knee.Patient was noted to have a high white count on his routine exam.  His white count was 19.2 on 07/22/2018 with an H&H of 11.4/35.1 and a platelet count of 470.  At that point he was prescribed a course of antibiotics and a repeat blood work on 08/21/2018 showed white count of 42.3, H&H of 7.1/23 with an MCV of 84 and a platelet count of 791 and hence the patient was referred to hematology for further management.   ?  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  In addition the core biopsy showed a small focus of fibrosis containing scattering of mast cells and eosinophils most suggestive of systemic mastocytosis.bone marrow blasts 7%.  Flow cytometry showed increased number of monocytic cells representing 16% of all cells with expression for HLA-DR, CD11b, CD13, CD14, CD33, CD38, CD56 and CD56 and CD64 with [facility] CD 117 are CD34. normal cytogenetics, CBL, SRSF2, SH2B3 and TET2  ?  ?Repeat bone marrow biopsy after 4 cycles of Vidaza showed less pronounced monocytic/granulocytic proliferation and overall better granulopoiesis and no increase in blasts.  In addition features of systemic mastocytosis not present. ?  ?After  9 cycles of Vidaza patient was noted to have significant anemia and worsening thrombocytosis for which a repeat bone marrow biopsy was done.  Bone marrow did not show any features of progression of CMML.  Overall stable findings and no increased blast.He was found to have iron deficiency and received 2 doses of Feraheme. ?  ?Patient seen by [facility] clinic GI and underwent EGD and colonoscopy for iron deficiency anemia.Colonoscopy showed nonbleeding internal hemorrhoids.  Normal mucosa in the colon.  No polyps EGD showed normal esophagus and gastric mucosal atrophy.  No stigmata of bleeding ?  ?Patient presented to the ER in November 2022 with significant fatigue and found to have an anemia with a hemoglobin of 5 and significant hyperleukocytosis.  [facility] was confirmed that he had transformed to AML.  He was seen by Dr. Janene Madeira as an outpatient and started venetoclax plus Vidaza on 02/14/2021.  Bone marrow biopsy after cycle 1 showed morphologic remission ?  ? ?Interval history-patient was recently admitted at [facility] for high fevers as well as diarrhea and was found to have C. difficile.  He completed 10 days of fidaxomicin as an outpatient.  Today he reports feeling well.  He knows to start taking venetoclax from today for 3 weeks on 1 week off. ? ?ECOG PS- 1 ?Pain scale- 0 ? ? ?Review of systems- Review of Systems  ?Constitutional:  Negative for chills, fever, malaise/fatigue and weight loss.  ?HENT:  Negative for congestion, ear discharge and nosebleeds.   ?Eyes:  Negative for blurred vision.  ?Respiratory:  Negative for cough, hemoptysis, sputum  production, shortness of breath and wheezing.   ?Cardiovascular:  Negative for chest pain, palpitations, orthopnea and claudication.  ?Gastrointestinal:  Negative for abdominal pain, blood in stool, constipation, diarrhea, heartburn, melena, nausea and vomiting.  ?Genitourinary:  Negative for dysuria, flank pain, frequency, hematuria and urgency.   ?Musculoskeletal:  Negative for back pain, joint pain and myalgias.  ?Skin:  Negative for rash.  ?Neurological:  Negative for dizziness, tingling, focal weakness, seizures, weakness and headaches.  ?Endo/Heme/Allergies:  Does not bruise/bleed easily.  ?Psychiatric/Behavioral:  Negative for depression and suicidal ideas. The patient does not have insomnia.    ? ? ?No Known Allergies ? ? ?Past Medical History:  ?Diagnosis Date  ? Anemia   ? CMML (chronic myelomonocytic leukemia) ([place])   ? CMML (chronic myelomonocytic leukemia) ([place])   ? Dyspnea   ? Hypertension   ? ? ? ?Past Surgical History:  ?Procedure Laterality Date  ? COLONOSCOPY N/A 08/28/2019  ? Procedure: COLONOSCOPY;  Surgeon: Toledo, Benay Pike, MD;  Location: [facility] ENDOSCOPY;  Service: Gastroenterology;  Laterality: N/A;  ? COLONOSCOPY WITH PROPOFOL N/A 05/03/2015  ? Procedure: COLONOSCOPY WITH PROPOFOL;  Surgeon: [name], MD;  Location: [facility] ENDOSCOPY;  Service: Gastroenterology;  Laterality: N/A;  ? ESOPHAGOGASTRODUODENOSCOPY N/A 08/28/2019  ? Procedure: ESOPHAGOGASTRODUODENOSCOPY (EGD);  Surgeon: Toledo, Benay Pike, MD;  Location: [facility] ENDOSCOPY;  Service: Gastroenterology;  Laterality: N/A;  ? ESOPHAGOGASTRODUODENOSCOPY (EGD) WITH PROPOFOL N/A 05/03/2015  ? Procedure: ESOPHAGOGASTRODUODENOSCOPY (EGD) WITH PROPOFOL;  Surgeon: [name], MD;  Location: [facility] ENDOSCOPY;  Service: Gastroenterology;  Laterality: N/A;  ? HERNIA REPAIR    ? 1975  ? ? ?Social History  ? ?Socioeconomic History  ? Marital status: Divorced  ?  Spouse name: Not on file  ? Number of children: 4  ? Years of education: Not on file  ? Highest education level: Not on file  ?Occupational History  ? Occupation: retired  ?  Comment: [employer]  ?Tobacco Use  ? Smoking status: Never  ? Smokeless tobacco: Never  ?Vaping Use  ? Vaping Use: Never used  ?Substance and Sexual Activity  ? Alcohol use: No  ? Drug use: No  ? Sexual activity: Not Currently  ?Other Topics Concern  ? Not on  file  ?Social History Narrative  ? Not on file  ? ?Social Determinants of Health  ? ?Financial Resource Strain: Low Risk   ? Difficulty of Paying Living Expenses: Not very hard  ?Food Insecurity: Not on file  ?Transportation Needs: Not on file  ?Physical Activity: Not on file  ?Stress: Not on file  ?Social Connections: Not on file  ?Intimate Partner Violence: Not on file  ? ? ?Family History  ?Problem Relation Age of Onset  ? Alzheimer's disease Father   ? Lung cancer Sister   ? Cancer Sister   ? ? ? ?Current Outpatient Medications:  ?  allopurinol (ZYLOPRIM) 300 MG tablet, Take 1 tablet (300 mg total) by mouth daily., Disp: 90 tablet, Rfl: 2 ?  pantoprazole (PROTONIX) 40 MG tablet, TAKE 1 TABLET BY MOUTH EVERY DAY, Disp: 90 tablet, Rfl: 1 ?  potassium chloride SA (KLOR-CON M20) 20 MEQ tablet, Take 1 tablet (20 mEq total) by mouth 2 (two) times daily., Disp: 180 tablet, Rfl: 1 ?  prochlorperazine (COMPAZINE) 10 MG tablet, Take 1 tablet (10 mg total) by mouth every 6 (six) hours as needed., Disp: 30 tablet, Rfl: 1 ?  Suvorexant (BELSOMRA) 15 MG TABS, Take 15 mg by mouth at bedtime as  needed., Disp: 30 tablet, Rfl: 2 ?  tranexamic acid (LYSTEDA) 650 MG TABS tablet, Take 1,300 mg by mouth 3 (three) times daily., Disp: , Rfl:  ?  valACYclovir (VALTREX) 500 MG tablet, Take 1 tablet (500 mg total) by mouth daily., Disp: 90 tablet, Rfl: 1 ?  venetoclax (VENCLEXTA) 100 MG tablet, Take 400 mg by mouth daily. Take for 21 days, then hold for 7 days. Repeat every 28 days., Disp: , Rfl:  ?  ferrous sulfate 325 (65 FE) MG tablet, Take 1 tablet (325 mg total) by mouth daily., Disp: 30 tablet, Rfl: 1 ?No current facility-administered medications for this visit. ? ?Facility-Administered Medications Ordered in Other Visits:  ?  azaCITIDine (VIDAZA) chemo injection 200 mg, 75 mg/m2 (Treatment Plan Recorded), Subcutaneous, Once, Sindy Guadeloupe, MD ?  ondansetron [facility]) tablet 8 mg, 8 mg, Oral, Once, Sindy Guadeloupe, MD ? ?Physical  exam:  ?Vitals:  ? 07/11/21 1002  ?BP: 129/66  ?Pulse: 86  ?Resp: 20  ?Temp: 98.3 ?F (36.8 ?C)  ?SpO2: 100%  ?Weight: 280 lb 14.4 oz (127.4 kg)  ? ?Physical Exam ?Constitutional:   ?   General: He is not in ac

## 2021-07-11 NOTE — Progress Notes (Signed)
Pt will like to talk about DIFICID medication; [facility] informed him that when it ran out to speak to Dr. Janese Banks or PCP about refills.  ?

## 2021-07-11 NOTE — Unmapped (Signed)
[facility] Refill Coordination Note    Specialty Medication(s) to be Shipped:   Hematology/Oncology: Venclexta    Other medication(s) to be shipped: No additional medications requested for fill at this time     Gary Baird, DOB: [birth year]  Phone: [phone] (home)       All above HIPAA information was verified with patient's family member, Daughter.     Was a Nurse, learning disability used for this call? No    Completed refill call assessment today to schedule patient's medication shipment from the [facility] [phone]).  All relevant notes have been reviewed.     Specialty medication(s) and dose(s) confirmed: Regimen is correct and unchanged.   Changes to medications: Gary Baird reports no changes at this time.  Changes to insurance: No  New side effects reported not previously addressed with a pharmacist or physician: None reported  Questions for the pharmacist: No    Confirmed patient received a Conservation officer, historic buildings and a Surveyor, mining with first shipment. The patient will receive a drug information handout for each medication shipped and additional FDA Medication Guides as required.       DISEASE/MEDICATION-SPECIFIC INFORMATION        N/A    SPECIALTY MEDICATION ADHERENCE     Medication Adherence    Patient reported X missed doses in the last month: 0  Specialty Medication: Venclexta 100mg   Patient is on additional specialty medications: No  Informant: child/children              Were doses missed due to medication being on hold? No    VENCLEXTA 100 mg: unknown days of medicine on hand.  New cycle starts 07/11/21     REFERRAL TO PHARMACIST     Referral to the pharmacist: Not needed      [facility]     Shipping address confirmed in Epic.     Delivery Scheduled: Yes, Expected medication delivery date: 07/13/21.     Medication will be delivered via Next Day Courier to the prescription address in Epic [place].    Gary Baird   [facility] Specialty Technician

## 2021-07-11 NOTE — Unmapped (Signed)
Patient need refill if appropriate.     Most recent clinic visit: 07/07/2021  Next clinic visit:08/31/2021

## 2021-07-12 ENCOUNTER — Inpatient Hospital Stay: Payer: Medicare Other

## 2021-07-12 VITALS — BP 109/62 | HR 101 | Temp 97.5°F | Resp 18

## 2021-07-12 DIAGNOSIS — Z5111 Encounter for antineoplastic chemotherapy: Secondary | ICD-10-CM | POA: Diagnosis not present

## 2021-07-12 DIAGNOSIS — C9200 Acute myeloblastic leukemia, not having achieved remission: Secondary | ICD-10-CM | POA: Diagnosis not present

## 2021-07-12 DIAGNOSIS — C9310 Chronic myelomonocytic leukemia not having achieved remission: Secondary | ICD-10-CM

## 2021-07-12 DIAGNOSIS — Z79899 Other long term (current) drug therapy: Secondary | ICD-10-CM | POA: Diagnosis not present

## 2021-07-12 MED ORDER — ONDANSETRON HCL 4 MG PO TABS
8.0000 mg | ORAL_TABLET | Freq: Once | ORAL | Status: AC
  Administered 2021-07-12: 8 mg via ORAL
  Filled 2021-07-12: qty 2

## 2021-07-12 MED ORDER — AZACITIDINE CHEMO SQ INJECTION
75.0000 mg/m2 | Freq: Once | INTRAMUSCULAR | Status: AC
  Administered 2021-07-12: 200 mg via SUBCUTANEOUS
  Filled 2021-07-12: qty 8

## 2021-07-12 MED FILL — VENCLEXTA 100 MG TABLET: ORAL | 28 days supply | Qty: 84 | Fill #3

## 2021-07-12 NOTE — Patient Instructions (Signed)
[facility] CTR AT [place]  Discharge Instructions: ?Thank you for choosing [place] to provide your oncology and hematology care.  ?If you have a lab appointment with the [facility], please go directly to the [facility] and check in at the registration area. ? ?Wear comfortable clothing and clothing appropriate for easy access to any Portacath or PICC line.  ? ?We strive to give you quality time with your provider. You may need to reschedule your appointment if you arrive late (15 or more minutes).  Arriving late affects you and other patients whose appointments are after yours.  Also, if you miss three or more appointments without notifying the office, you may be dismissed from the clinic at the provider?s discretion.    ?  ?For prescription refill requests, have your pharmacy contact our office and allow 72 hours for refills to be completed.   ? ?Today you received the following chemotherapy and/or immunotherapy agents VIDAZA    ?  ?To help prevent nausea and vomiting after your treatment, we encourage you to take your nausea medication as directed. ? ?BELOW ARE SYMPTOMS THAT SHOULD BE REPORTED IMMEDIATELY: ?*FEVER GREATER THAN 100.4 F (38 ?C) OR HIGHER ?*CHILLS OR SWEATING ?*NAUSEA AND VOMITING THAT IS NOT CONTROLLED WITH YOUR NAUSEA MEDICATION ?*UNUSUAL SHORTNESS OF BREATH ?*UNUSUAL BRUISING OR BLEEDING ?*URINARY PROBLEMS (pain or burning when urinating, or frequent urination) ?*BOWEL PROBLEMS (unusual diarrhea, constipation, pain near the anus) ?TENDERNESS IN MOUTH AND THROAT WITH OR WITHOUT PRESENCE OF ULCERS (sore throat, sores in mouth, or a toothache) ?UNUSUAL RASH, SWELLING OR PAIN  ?UNUSUAL VAGINAL DISCHARGE OR ITCHING  ? ?Items with * indicate a potential emergency and should be followed up as soon as possible or go to the Emergency Department if any problems should occur. ? ?Please show the CHEMOTHERAPY ALERT CARD or IMMUNOTHERAPY ALERT CARD at check-in to the  Emergency Department and triage nurse. ? ?Should you have questions after your visit or need to cancel or reschedule your appointment, please contact [facility] CANCER [place] AT [place]  [phone] and follow the prompts.  Office hours are 8:00 a.m. to 4:30 p.m. Monday - Friday. Please note that voicemails left after 4:00 p.m. may not be returned until the following business day.  We are closed weekends and major holidays. You have access to a nurse at all times for urgent questions. Please call the main number to the clinic [phone] and follow the prompts. ? ?For any non-urgent questions, you may also contact your provider using MyChart. We now offer e-Visits for anyone 6 and older to request care online for non-urgent symptoms. For details visit [email or web address]. ?  ?Also download the MyChart app! Go to the app store, search "MyChart", open the app, select [place], and log in with your MyChart username and password. ? ?Due to Covid, a mask is required upon entering the hospital/clinic. If you do not have a mask, one will be given to you upon arrival. For doctor visits, patients may have 1 support person aged 36 or older with them. For treatment visits, patients cannot have anyone with them due to current Covid guidelines and our immunocompromised population.  ? ?Azacitidine suspension for injection (subcutaneous use) ?What is this medication? ?AZACITIDINE (ay [place]) is a chemotherapy drug. This medicine reduces the growth of cancer cells and can suppress the immune system. It is used for treating myelodysplastic syndrome or some types of leukemia. ?This medicine may be used for other purposes; ask  your health care provider or pharmacist if you have questions. ?COMMON BRAND NAME(S): Vidaza ?What should I tell my care team before I take this medication? ?They need to know if you have any of these conditions: ?kidney disease ?liver disease ?liver tumors ?an unusual or allergic  reaction to azacitidine, mannitol, other medicines, foods, dyes, or preservatives ?pregnant or trying to get pregnant ?breast-feeding ?How should I use this medication? ?This medicine is for injection under the skin. It is administered in a hospital or clinic by a specially trained health care professional. ?Talk to your pediatrician regarding the use of this medicine in children. While this drug may be prescribed for selected conditions, precautions do apply. ?Overdosage: If you think you have taken too much of this medicine contact a poison control center or emergency room at once. ?NOTE: This medicine is only for you. Do not share this medicine with others. ?What if I miss a dose? ?It is important not to miss your dose. Call your doctor or health care professional if you are unable to keep an appointment. ?What may interact with this medication? ?Interactions have not been studied. ?Give your health care provider a list of all the medicines, herbs, non-prescription drugs, or dietary supplements you use. Also tell them if you smoke, drink alcohol, or use illegal drugs. Some items may interact with your medicine. ?This list may not describe all possible interactions. Give your health care provider a list of all the medicines, herbs, non-prescription drugs, or dietary supplements you use. Also tell them if you smoke, drink alcohol, or use illegal drugs. Some items may interact with your medicine. ?What should I watch for while using this medication? ?Visit your doctor for checks on your progress. This drug may make you feel generally unwell. This is not uncommon, as chemotherapy can affect healthy cells as well as cancer cells. Report any side effects. Continue your course of treatment even though you feel ill unless your doctor tells you to stop. ?In some cases, you may be given additional medicines to help with side effects. Follow all directions for their use. ?Call your doctor or health care professional for  advice if you get a fever, chills or sore throat, or other symptoms of a cold or flu. Do not treat yourself. This drug decreases your body's ability to fight infections. Try to avoid being around people who are sick. ?This medicine may increase your risk to bruise or bleed. Call your doctor or health care professional if you notice any unusual bleeding. ?You may need blood work done while you are taking this medicine. ?Do not become pregnant while taking this medicine and for 6 months after the last dose. Women should inform their doctor if they wish to become pregnant or think they might be pregnant. Men should not father a child while taking this medicine and for 3 months after the last dose. There is a potential for serious side effects to an unborn child. Talk to your health care professional or pharmacist for more information. Do not breast-feed an infant while taking this medicine and for 1 week after the last dose. ?This medicine may interfere with the ability to have a child. Talk with your doctor or health care professional if you are concerned about your fertility. ?What side effects may I notice from receiving this medication? ?Side effects that you should report to your doctor or health care professional as soon as possible: ?allergic reactions like skin rash, itching or hives, swelling of the face, lips,  or tongue ?low blood counts - this medicine may decrease the number of white blood cells, red blood cells and platelets. You may be at increased risk for infections and bleeding. ?signs of infection - fever or chills, cough, sore throat, pain passing urine ?signs of decreased platelets or bleeding - bruising, pinpoint red spots on the skin, black, tarry stools, blood in the urine ?signs of decreased red blood cells - unusually weak or tired, fainting spells, lightheadedness ?signs and symptoms of kidney injury like trouble passing urine or change in the amount of urine ?signs and symptoms of liver  injury like dark yellow or brown urine; general ill feeling or flu-like symptoms; light-colored stools; loss of appetite; nausea; right upper belly pain; unusually weak or tired; yellowing of the eyes or skin ?Si

## 2021-07-13 ENCOUNTER — Inpatient Hospital Stay: Payer: Medicare Other

## 2021-07-13 VITALS — BP 126/65 | HR 102 | Temp 97.1°F

## 2021-07-13 DIAGNOSIS — Z79899 Other long term (current) drug therapy: Secondary | ICD-10-CM | POA: Diagnosis not present

## 2021-07-13 DIAGNOSIS — C9310 Chronic myelomonocytic leukemia not having achieved remission: Secondary | ICD-10-CM | POA: Diagnosis not present

## 2021-07-13 DIAGNOSIS — Z5111 Encounter for antineoplastic chemotherapy: Secondary | ICD-10-CM | POA: Diagnosis not present

## 2021-07-13 DIAGNOSIS — C9200 Acute myeloblastic leukemia, not having achieved remission: Secondary | ICD-10-CM | POA: Diagnosis not present

## 2021-07-13 MED ORDER — AZACITIDINE CHEMO SQ INJECTION
75.0000 mg/m2 | Freq: Once | INTRAMUSCULAR | Status: AC
  Administered 2021-07-13: 200 mg via SUBCUTANEOUS
  Filled 2021-07-13: qty 8

## 2021-07-13 MED ORDER — ONDANSETRON HCL 4 MG PO TABS
8.0000 mg | ORAL_TABLET | Freq: Once | ORAL | Status: AC
  Administered 2021-07-13: 8 mg via ORAL
  Filled 2021-07-13: qty 2

## 2021-07-13 NOTE — Patient Instructions (Signed)
[facility] AT [place]  Discharge Instructions: °Thank you for choosing [facility] to provide your oncology and hematology care.  ° °If you have a lab appointment with the Cancer Center, please go directly to the Cancer Center and check in at the registration area. °  °Wear comfortable clothing and clothing appropriate for easy access to any Portacath or PICC line.  ° °We strive to give you quality time with your provider. You may need to reschedule your appointment if you arrive late (15 or more minutes).  Arriving late affects you and other patients whose appointments are after yours.  Also, if you miss three or more appointments without notifying the office, you may be dismissed from the clinic at the provider’s discretion.    °  °For prescription refill requests, have your pharmacy contact our office and allow 72 hours for refills to be completed.   ° °Today you received the following chemotherapy and/or immunotherapy agents     °  °To help prevent nausea and vomiting after your treatment, we encourage you to take your nausea medication as directed. ° °BELOW ARE SYMPTOMS THAT SHOULD BE REPORTED IMMEDIATELY: °*FEVER GREATER THAN 100.4 F (38 °C) OR HIGHER °*CHILLS OR SWEATING °*NAUSEA AND VOMITING THAT IS NOT CONTROLLED WITH YOUR NAUSEA MEDICATION °*UNUSUAL SHORTNESS OF BREATH °*UNUSUAL BRUISING OR BLEEDING °*URINARY PROBLEMS (pain or burning when urinating, or frequent urination) °*BOWEL PROBLEMS (unusual diarrhea, constipation, pain near the anus) °TENDERNESS IN MOUTH AND THROAT WITH OR WITHOUT PRESENCE OF ULCERS (sore throat, sores in mouth, or a toothache) °UNUSUAL RASH, SWELLING OR PAIN  °UNUSUAL VAGINAL DISCHARGE OR ITCHING  ° °Items with * indicate a potential emergency and should be followed up as soon as possible or go to the Emergency Department if any problems should occur. ° °Please show the CHEMOTHERAPY ALERT CARD or IMMUNOTHERAPY ALERT CARD at check-in to the  Emergency Department and triage nurse. ° °Should you have questions after your visit or need to cancel or reschedule your appointment, please contact [facility] AT [place]  Dept: [phone]  and follow the prompts.  Office hours are 8:00 a.m. to 4:30 p.m. Monday - Friday. Please note that voicemails left after 4:00 p.m. may not be returned until the following business day.  We are closed weekends and major holidays. You have access to a nurse at all times for urgent questions. Please call the main number to the clinic Dept: [phone] and follow the prompts. ° ° °For any non-urgent questions, you may also contact your provider using MyChart. We now offer e-Visits for anyone 18 and older to request care online for non-urgent symptoms. For details visit [email or web address]. °  °Also download the MyChart app! Go to the app store, search "MyChart", open the app, select [place], and log in with your MyChart username and password. ° °Due to Covid, a mask is required upon entering the hospital/clinic. If you do not have a mask, one will be given to you upon arrival. For doctor visits, patients may have 1 support person aged 18 or older with them. For treatment visits, patients cannot have anyone with them due to current Covid guidelines and our immunocompromised population.  ° °

## 2021-07-14 ENCOUNTER — Inpatient Hospital Stay: Payer: Medicare Other

## 2021-07-14 VITALS — BP 127/64 | HR 102 | Temp 98.2°F | Resp 19

## 2021-07-14 DIAGNOSIS — Z5111 Encounter for antineoplastic chemotherapy: Secondary | ICD-10-CM | POA: Diagnosis not present

## 2021-07-14 DIAGNOSIS — C9310 Chronic myelomonocytic leukemia not having achieved remission: Secondary | ICD-10-CM

## 2021-07-14 DIAGNOSIS — C9200 Acute myeloblastic leukemia, not having achieved remission: Secondary | ICD-10-CM | POA: Diagnosis not present

## 2021-07-14 DIAGNOSIS — Z79899 Other long term (current) drug therapy: Secondary | ICD-10-CM | POA: Diagnosis not present

## 2021-07-14 MED ORDER — ONDANSETRON HCL 4 MG PO TABS
8.0000 mg | ORAL_TABLET | Freq: Once | ORAL | Status: AC
  Administered 2021-07-14: 8 mg via ORAL
  Filled 2021-07-14: qty 2

## 2021-07-14 MED ORDER — AZACITIDINE CHEMO SQ INJECTION
75.0000 mg/m2 | Freq: Once | INTRAMUSCULAR | Status: AC
  Administered 2021-07-14: 200 mg via SUBCUTANEOUS
  Filled 2021-07-14: qty 8

## 2021-07-14 NOTE — Unmapped (Signed)
I saw and evaluated the patient, participating in the key portions of the service.  I reviewed the resident???s note.  I agree with the resident???s findings and plan. Vonna Brabson T Laylah Riga, MD

## 2021-07-15 ENCOUNTER — Inpatient Hospital Stay: Payer: Medicare Other

## 2021-07-15 VITALS — BP 129/76 | HR 108 | Temp 98.1°F

## 2021-07-15 DIAGNOSIS — C9310 Chronic myelomonocytic leukemia not having achieved remission: Secondary | ICD-10-CM

## 2021-07-15 DIAGNOSIS — Z79899 Other long term (current) drug therapy: Secondary | ICD-10-CM | POA: Diagnosis not present

## 2021-07-15 DIAGNOSIS — Z5111 Encounter for antineoplastic chemotherapy: Secondary | ICD-10-CM | POA: Diagnosis not present

## 2021-07-15 DIAGNOSIS — C9200 Acute myeloblastic leukemia, not having achieved remission: Secondary | ICD-10-CM | POA: Diagnosis not present

## 2021-07-15 MED ORDER — ONDANSETRON HCL 4 MG PO TABS
8.0000 mg | ORAL_TABLET | Freq: Once | ORAL | Status: AC
  Administered 2021-07-15: 8 mg via ORAL
  Filled 2021-07-15: qty 2

## 2021-07-15 MED ORDER — AZACITIDINE CHEMO SQ INJECTION
75.0000 mg/m2 | Freq: Once | INTRAMUSCULAR | Status: AC
  Administered 2021-07-15: 200 mg via SUBCUTANEOUS
  Filled 2021-07-15: qty 8

## 2021-07-20 ENCOUNTER — Encounter: Payer: Self-pay | Admitting: Oncology

## 2021-07-20 NOTE — Telephone Encounter (Signed)
Can you look into this and inform [facility]?

## 2021-07-29 ENCOUNTER — Encounter: Payer: Self-pay | Admitting: Oncology

## 2021-07-29 ENCOUNTER — Other Ambulatory Visit: Payer: Self-pay

## 2021-07-29 MED ORDER — METHOCARBAMOL 750 MG PO TABS: 750.0000 mg | ORAL_TABLET | Freq: Three times a day (TID) | ORAL | 0 refills | Status: AC

## 2021-08-02 ENCOUNTER — Telehealth: Payer: Self-pay | Admitting: *Deleted

## 2021-08-02 ENCOUNTER — Inpatient Hospital Stay: Payer: Medicare Other

## 2021-08-02 DIAGNOSIS — C9201 Acute myeloblastic leukemia, in remission: Principal | ICD-10-CM

## 2021-08-02 DIAGNOSIS — Z79899 Other long term (current) drug therapy: Secondary | ICD-10-CM | POA: Diagnosis not present

## 2021-08-02 DIAGNOSIS — C9200 Acute myeloblastic leukemia, not having achieved remission: Secondary | ICD-10-CM | POA: Diagnosis not present

## 2021-08-02 DIAGNOSIS — C9310 Chronic myelomonocytic leukemia not having achieved remission: Secondary | ICD-10-CM | POA: Diagnosis not present

## 2021-08-02 DIAGNOSIS — Z5111 Encounter for antineoplastic chemotherapy: Secondary | ICD-10-CM | POA: Diagnosis not present

## 2021-08-02 LAB — CBC WITH DIFFERENTIAL/PLATELET
Abs Immature Granulocytes: 0.3 10*3/uL — ABNORMAL HIGH (ref 0.00–0.07)
Basophils Absolute: 0 10*3/uL (ref 0.0–0.1)
Basophils Relative: 0 %
Blasts: 36 %
Eosinophils Absolute: 0.9 10*3/uL — ABNORMAL HIGH (ref 0.0–0.5)
Eosinophils Relative: 6 %
HCT: 38.1 % — ABNORMAL LOW (ref 39.0–52.0)
Hemoglobin: 12.4 g/dL — ABNORMAL LOW (ref 13.0–17.0)
Lymphocytes Relative: 29 %
Lymphs Abs: 4.6 10*3/uL — ABNORMAL HIGH (ref 0.7–4.0)
MCH: 28.8 pg (ref 26.0–34.0)
MCHC: 32.5 g/dL (ref 30.0–36.0)
MCV: 88.6 fL (ref 80.0–100.0)
Metamyelocytes Relative: 1 %
Monocytes Absolute: 0.5 10*3/uL (ref 0.1–1.0)
Monocytes Relative: 3 %
Myelocytes: 1 %
Neutro Abs: 3.8 10*3/uL (ref 1.7–7.7)
Neutrophils Relative %: 24 %
Platelets: 135 10*3/uL — ABNORMAL LOW (ref 150–400)
RBC: 4.3 MIL/uL (ref 4.22–5.81)
RDW: 26.1 % — ABNORMAL HIGH (ref 11.5–15.5)
Smear Review: NORMAL
WBC: 15.8 10*3/uL — ABNORMAL HIGH (ref 4.0–10.5)
nRBC: 1.8 % — ABNORMAL HIGH (ref 0.0–0.2)

## 2021-08-02 LAB — COMPREHENSIVE METABOLIC PANEL
ALT: 9 U/L (ref 0–44)
AST: 26 U/L (ref 15–41)
Albumin: 3.5 g/dL (ref 3.5–5.0)
Alkaline Phosphatase: 95 U/L (ref 38–126)
Anion gap: 9 (ref 5–15)
BUN: <5 mg/dL — ABNORMAL LOW (ref 8–23)
CO2: 26 mmol/L (ref 22–32)
Calcium: 8.8 mg/dL — ABNORMAL LOW (ref 8.9–10.3)
Chloride: 102 mmol/L (ref 98–111)
Creatinine, Ser: 1.11 mg/dL (ref 0.61–1.24)
GFR, Estimated: >60 mL/min (ref >60)
Glucose, Bld: 129 mg/dL — ABNORMAL HIGH (ref 70–99)
Potassium: 3 mmol/L — ABNORMAL LOW (ref 3.5–5.1)
Sodium: 137 mmol/L (ref 135–145)
Total Bilirubin: 0.5 mg/dL (ref 0.3–1.2)
Total Protein: 7.7 g/dL (ref 6.5–8.1)

## 2021-08-02 LAB — PATHOLOGIST SMEAR REVIEW

## 2021-08-02 NOTE — Telephone Encounter (Signed)
Samuel Frank has been speaking to me through chats. Patient can in today to get labs to see if he can get cataract surgery this week.  I told him that he has blasts in blood cells and it was like that one other time and that is why he had to [facility] for sec. Opinion. Dr. Janese Banks spoke to Dr. Janene Madeira and he states that he does not need to get treatment next week, you can't get cataract surgery and he is trying to get you in for bone narrow biopsy this week. He said that he did not get a call today but it usually goes through [place] his daughter. Samuel Frank and pt. Had said that it hurts on his back when they have the biopsy. I asked both of them if they could ask team and Samuel Frank is there anything to help with pain.Samuel Frank says she will send them a message on my chart to [facility].  The patient told me that he has lost his appetite. Today he did not eat anything. I told him that he has to eat something. He is drinking ensure and he does not have 1 today. He will get his kids to get him some for him tom. He also states that his lower back pain is still there and the muscle spasm med is not doing anything. I told him I will talk to Janese Banks and see what can be done. I will call him back tom.

## 2021-08-02 NOTE — Telephone Encounter (Signed)
Dr. Janese Banks got a message that blasts in smear with cbc, he was suppose to have the labs to see if he can get his cataract surgery on this Friday and I have the cataract  removed. Based on the lab showing blast cells and Dr Janene Madeira and Dr. Janese Banks spoke and Janene Madeira is trying to set up a MB bx. Odella his daughter and sent me a message and I told her all of this and she said they are calling her about it. I also told that I am going to call [facility] to let them know that he will not be able to get cataract surgery at this time. Caryl Pina is aware.

## 2021-08-03 ENCOUNTER — Emergency Department: Admit: 2021-08-03 | Discharge: 2021-08-05 | Disposition: A | Discharge status: Hospice | Payer: MEDICARE

## 2021-08-03 ENCOUNTER — Ambulatory Visit: Admit: 2021-08-03 | Discharge: 2021-08-05 | Payer: MEDICARE

## 2021-08-03 ENCOUNTER — Other Ambulatory Visit: Payer: Self-pay | Admitting: *Deleted

## 2021-08-03 DIAGNOSIS — J9811 Atelectasis: Secondary | ICD-10-CM | POA: Diagnosis not present

## 2021-08-03 DIAGNOSIS — R5383 Other fatigue: Secondary | ICD-10-CM | POA: Diagnosis not present

## 2021-08-03 DIAGNOSIS — C9202 Acute myeloblastic leukemia, in relapse: Secondary | ICD-10-CM | POA: Diagnosis not present

## 2021-08-03 DIAGNOSIS — I491 Atrial premature depolarization: Secondary | ICD-10-CM | POA: Diagnosis not present

## 2021-08-03 DIAGNOSIS — I10 Essential (primary) hypertension: Secondary | ICD-10-CM | POA: Diagnosis not present

## 2021-08-03 DIAGNOSIS — Z66 Do not resuscitate: Secondary | ICD-10-CM | POA: Diagnosis not present

## 2021-08-03 DIAGNOSIS — R0989 Other specified symptoms and signs involving the circulatory and respiratory systems: Secondary | ICD-10-CM | POA: Diagnosis not present

## 2021-08-03 DIAGNOSIS — R112 Nausea with vomiting, unspecified: Secondary | ICD-10-CM | POA: Diagnosis not present

## 2021-08-03 DIAGNOSIS — R509 Fever, unspecified: Secondary | ICD-10-CM | POA: Diagnosis not present

## 2021-08-03 DIAGNOSIS — Z9981 Dependence on supplemental oxygen: Secondary | ICD-10-CM | POA: Diagnosis not present

## 2021-08-03 DIAGNOSIS — J811 Chronic pulmonary edema: Secondary | ICD-10-CM | POA: Diagnosis not present

## 2021-08-03 DIAGNOSIS — R5381 Other malaise: Secondary | ICD-10-CM | POA: Diagnosis not present

## 2021-08-03 DIAGNOSIS — I2510 Atherosclerotic heart disease of native coronary artery without angina pectoris: Secondary | ICD-10-CM | POA: Diagnosis not present

## 2021-08-03 DIAGNOSIS — R000 Tachycardia, unspecified: Secondary | ICD-10-CM | POA: Diagnosis not present

## 2021-08-03 DIAGNOSIS — Z20822 Contact with and (suspected) exposure to covid-19: Secondary | ICD-10-CM | POA: Diagnosis not present

## 2021-08-03 DIAGNOSIS — C9480 Other specified leukemias not having achieved remission: Secondary | ICD-10-CM | POA: Diagnosis not present

## 2021-08-03 DIAGNOSIS — C9310 Chronic myelomonocytic leukemia not having achieved remission: Secondary | ICD-10-CM | POA: Diagnosis not present

## 2021-08-03 DIAGNOSIS — Z79899 Other long term (current) drug therapy: Secondary | ICD-10-CM | POA: Diagnosis not present

## 2021-08-03 DIAGNOSIS — R0902 Hypoxemia: Secondary | ICD-10-CM | POA: Diagnosis not present

## 2021-08-03 DIAGNOSIS — A419 Sepsis, unspecified organism: Secondary | ICD-10-CM | POA: Diagnosis not present

## 2021-08-03 DIAGNOSIS — D61818 Other pancytopenia: Secondary | ICD-10-CM | POA: Diagnosis not present

## 2021-08-03 DIAGNOSIS — R9431 Abnormal electrocardiogram [ECG] [EKG]: Secondary | ICD-10-CM | POA: Diagnosis not present

## 2021-08-03 DIAGNOSIS — I4891 Unspecified atrial fibrillation: Secondary | ICD-10-CM | POA: Diagnosis not present

## 2021-08-03 DIAGNOSIS — R1011 Right upper quadrant pain: Secondary | ICD-10-CM | POA: Diagnosis not present

## 2021-08-03 LAB — COMPREHENSIVE METABOLIC PANEL
ALBUMIN: 3.3 g/dL — ABNORMAL LOW (ref 3.4–5.0)
ALKALINE PHOSPHATASE: 199 U/L — ABNORMAL HIGH (ref 46–116)
ALT (SGPT): 10 U/L (ref 10–49)
ANION GAP: 9 mmol/L (ref 5–14)
AST (SGOT): 38 U/L — ABNORMAL HIGH (ref <=34)
BILIRUBIN TOTAL: 0.7 mg/dL (ref 0.3–1.2)
BLOOD UREA NITROGEN: 6 mg/dL — ABNORMAL LOW (ref 9–23)
BUN / CREAT RATIO: 5
CALCIUM: 8.9 mg/dL (ref 8.7–10.4)
CHLORIDE: 106 mmol/L (ref 98–107)
CO2: 24 mmol/L (ref 20.0–31.0)
CREATININE: 1.2 mg/dL — ABNORMAL HIGH
EGFR CKD-EPI (2021) MALE: 60 mL/min/{1.73_m2} (ref >=60)
GLUCOSE RANDOM: 139 mg/dL (ref 70–179)
POTASSIUM: 2.7 mmol/L — ABNORMAL LOW (ref 3.4–4.8)
PROTEIN TOTAL: 7.6 g/dL (ref 5.7–8.2)
SODIUM: 139 mmol/L (ref 135–145)

## 2021-08-03 LAB — CBC W/ AUTO DIFF
HEMATOCRIT: 35.7 % — ABNORMAL LOW (ref 39.0–48.0)
HEMOGLOBIN: 11.9 g/dL — ABNORMAL LOW (ref 12.9–16.5)
MEAN CORPUSCULAR HEMOGLOBIN CONC: 33.5 g/dL (ref 32.0–36.0)
MEAN CORPUSCULAR HEMOGLOBIN: 28.4 pg (ref 25.9–32.4)
MEAN CORPUSCULAR VOLUME: 84.9 fL (ref 77.6–95.7)
RED BLOOD CELL COUNT: 4.2 10*12/L — ABNORMAL LOW (ref 4.26–5.60)
RED CELL DISTRIBUTION WIDTH: 28.1 % — ABNORMAL HIGH (ref 12.2–15.2)

## 2021-08-03 MED ORDER — OXYCODONE HCL 5 MG PO TABS: 5.0000 mg | ORAL_TABLET | Freq: Four times a day (QID) | ORAL | 0 refills | Status: AC | PRN

## 2021-08-04 DIAGNOSIS — Z9981 Dependence on supplemental oxygen: Secondary | ICD-10-CM | POA: Diagnosis not present

## 2021-08-04 DIAGNOSIS — J811 Chronic pulmonary edema: Secondary | ICD-10-CM | POA: Diagnosis not present

## 2021-08-04 DIAGNOSIS — R0902 Hypoxemia: Secondary | ICD-10-CM | POA: Diagnosis not present

## 2021-08-04 DIAGNOSIS — J9811 Atelectasis: Secondary | ICD-10-CM | POA: Diagnosis not present

## 2021-08-04 DIAGNOSIS — M6281 Muscle weakness (generalized): Secondary | ICD-10-CM | POA: Diagnosis not present

## 2021-08-04 LAB — CBC W/ AUTO DIFF
HEMATOCRIT: 35.7 % — ABNORMAL LOW (ref 39.0–48.0)
HEMATOCRIT: 28 % — ABNORMAL LOW (ref 39.0–48.0)
HEMOGLOBIN: 11.9 g/dL — ABNORMAL LOW (ref 12.9–16.5)
HEMOGLOBIN: 9.4 g/dL — ABNORMAL LOW (ref 12.9–16.5)
MEAN CORPUSCULAR HEMOGLOBIN CONC: 33.5 g/dL (ref 32.0–36.0)
MEAN CORPUSCULAR HEMOGLOBIN CONC: 33.4 g/dL (ref 32.0–36.0)
MEAN CORPUSCULAR HEMOGLOBIN: 28.4 pg (ref 25.9–32.4)
MEAN CORPUSCULAR HEMOGLOBIN: 28.5 pg (ref 25.9–32.4)
MEAN CORPUSCULAR VOLUME: 84.9 fL (ref 77.6–95.7)
MEAN CORPUSCULAR VOLUME: 85.3 fL (ref 77.6–95.7)
MEAN PLATELET VOLUME: 8.2 fL (ref 6.8–10.7)
MEAN PLATELET VOLUME: 10.4 fL (ref 6.8–10.7)
NUCLEATED RED BLOOD CELLS: 2 /100{WBCs}
NUCLEATED RED BLOOD CELLS: 1 /100{WBCs}
PLATELET COUNT: 168 10*9/L (ref 150–450)
PLATELET COUNT: 59 10*9/L — ABNORMAL LOW (ref 150–450)
RED BLOOD CELL COUNT: 4.2 10*12/L — ABNORMAL LOW (ref 4.26–5.60)
RED BLOOD CELL COUNT: 3.28 10*12/L — ABNORMAL LOW (ref 4.26–5.60)
RED CELL DISTRIBUTION WIDTH: 28.1 % — ABNORMAL HIGH (ref 12.2–15.2)
RED CELL DISTRIBUTION WIDTH: 28.9 % — ABNORMAL HIGH (ref 12.2–15.2)
WBC ADJUSTED: 78.8 10*9/L (ref 3.6–11.2)
WBC ADJUSTED: 75.4 10*9/L (ref 3.6–11.2)

## 2021-08-04 LAB — MANUAL DIFFERENTIAL
BASOPHILS - ABS (DIFF): 0 10*9/L (ref 0.0–0.1)
BASOPHILS - ABS (DIFF): 0 10*9/L (ref 0.0–0.1)
BASOPHILS - REL (DIFF): 0 %
BASOPHILS - REL (DIFF): 0 %
BLASTS - REL (DIFF): 86 % (ref <=0)
BLASTS - REL (DIFF): 87 % (ref <=0)
EOSINOPHILS - ABS (DIFF): 1.6 10*9/L — ABNORMAL HIGH (ref 0.0–0.5)
EOSINOPHILS - ABS (DIFF): 0.8 10*9/L — ABNORMAL HIGH (ref 0.0–0.5)
EOSINOPHILS - REL (DIFF): 2 %
EOSINOPHILS - REL (DIFF): 1 %
LYMPHOCYTES - ABS (DIFF): 7.9 10*9/L — ABNORMAL HIGH (ref 1.1–3.6)
LYMPHOCYTES - ABS (DIFF): 7.5 10*9/L — ABNORMAL HIGH (ref 1.1–3.6)
LYMPHOCYTES - REL (DIFF): 10 %
LYMPHOCYTES - REL (DIFF): 10 %
MONOCYTES - ABS (DIFF): 0 10*9/L — ABNORMAL LOW (ref 0.3–0.8)
MONOCYTES - ABS (DIFF): 0 10*9/L — ABNORMAL LOW (ref 0.3–0.8)
MONOCYTES - REL (DIFF): 0 %
MONOCYTES - REL (DIFF): 0 %
NEUTROPHILS - ABS (DIFF): 1.6 10*9/L — ABNORMAL LOW (ref 1.8–7.8)
NEUTROPHILS - ABS (DIFF): 1.5 10*9/L — ABNORMAL LOW (ref 1.8–7.8)
NEUTROPHILS - REL (DIFF): 2 %
NEUTROPHILS - REL (DIFF): 2 %

## 2021-08-04 LAB — URINALYSIS WITH MICROSCOPY
BILIRUBIN UA: NEGATIVE
HYALINE CASTS: 74 /LPF — ABNORMAL HIGH (ref 0–1)
KETONES UA: 10 — AB
LEUKOCYTE ESTERASE UA: NEGATIVE
NITRITE UA: NEGATIVE
PH UA: 6 (ref 5.0–9.0)
PROTEIN UA: 100 — AB
RBC UA: 1 /HPF (ref <=3)
SPECIFIC GRAVITY UA: 1.026 (ref 1.003–1.030)
SQUAMOUS EPITHELIAL: 3 /HPF (ref 0–5)
UROBILINOGEN UA: <2
WBC UA: 4 /HPF — ABNORMAL HIGH (ref <=2)

## 2021-08-04 LAB — BASIC METABOLIC PANEL
ANION GAP: 10 mmol/L (ref 5–14)
BLOOD UREA NITROGEN: 5 mg/dL — ABNORMAL LOW (ref 9–23)
BUN / CREAT RATIO: 4
CALCIUM: 7.1 mg/dL — ABNORMAL LOW (ref 8.7–10.4)
CHLORIDE: 112 mmol/L — ABNORMAL HIGH (ref 98–107)
CO2: 22 mmol/L (ref 20.0–31.0)
CREATININE: 1.16 mg/dL — ABNORMAL HIGH
EGFR CKD-EPI (2021) MALE: 63 mL/min/{1.73_m2} (ref >=60)
GLUCOSE RANDOM: 111 mg/dL (ref 70–179)
POTASSIUM: 3.1 mmol/L — ABNORMAL LOW (ref 3.4–4.8)
SODIUM: 144 mmol/L (ref 135–145)

## 2021-08-04 LAB — URIC ACID
URIC ACID: 9.9 mg/dL — ABNORMAL HIGH
URIC ACID: 7.8 mg/dL

## 2021-08-04 LAB — APTT
APTT: 30.2 s (ref 25.1–36.5)
HEPARIN CORRELATION: 0.2

## 2021-08-04 LAB — LACTATE SEPSIS, VENOUS: LACTATE BLOOD VENOUS: 1.7 mmol/L (ref 0.5–1.8)

## 2021-08-04 LAB — MAGNESIUM
MAGNESIUM: 1.4 mg/dL — ABNORMAL LOW (ref 1.6–2.6)
MAGNESIUM: 1.3 mg/dL — ABNORMAL LOW (ref 1.6–2.6)

## 2021-08-04 LAB — D-DIMER, QUANTITATIVE
D-DIMER QUANTITATIVE (CW,ML,HL,HS,CH,JS,JC,RX): 39389 ng{FEU}/mL — ABNORMAL HIGH (ref <=500)
D-DIMER QUANTITATIVE (CW,ML,HL,HS,CH,JS,JC,RX): 36664 ng{FEU}/mL — ABNORMAL HIGH (ref <=500)

## 2021-08-04 LAB — FIBRINOGEN
FIBRINOGEN LEVEL: 279 mg/dL (ref 175–500)
FIBRINOGEN LEVEL: 201 mg/dL (ref 175–500)

## 2021-08-04 LAB — PHOSPHORUS
PHOSPHORUS: 1.4 mg/dL — ABNORMAL LOW (ref 2.4–5.1)
PHOSPHORUS: 1.9 mg/dL — ABNORMAL LOW (ref 2.4–5.1)

## 2021-08-04 LAB — IONIZED CALCIUM VENOUS: CALCIUM IONIZED VENOUS (MG/DL): 4.1 mg/dL — ABNORMAL LOW (ref 4.40–5.40)

## 2021-08-04 LAB — PERIPHERAL BLOOD SMEAR, PATH REVIEW

## 2021-08-04 LAB — PROTIME-INR
INR: 1.53
INR: 1.63
PROTIME: 17.6 s — ABNORMAL HIGH (ref 9.8–12.8)
PROTIME: 18.8 s — ABNORMAL HIGH (ref 9.8–12.8)

## 2021-08-04 MED ORDER — LORAZEPAM 0.5 MG TABLET
ORAL_TABLET | ORAL | 0 refills | 2 days | Status: CP | PRN
Start: 2021-08-04 — End: 2021-08-09
  Filled 2021-08-04: qty 10, 3d supply, fill #0

## 2021-08-04 MED ORDER — MORPHINE CONCENTRATE 100 MG/5 ML (20 MG/ML) ORAL SOLUTION
ORAL | 0 refills | 10 days | Status: CP | PRN
Start: 2021-08-04 — End: 2021-08-14
  Filled 2021-08-04: qty 30, 10d supply, fill #0

## 2021-08-04 MED ORDER — HYDROXYUREA 500 MG CAPSULE
ORAL_CAPSULE | Freq: Two times a day (BID) | ORAL | 0 refills | 45 days | Status: CP
Start: 2021-08-04 — End: 2021-09-18
  Filled 2021-08-04: qty 360, 45d supply, fill #0

## 2021-08-04 MED ADMIN — potassium chloride 10 mEq in 100 mL IVPB: 10 meq | INTRAVENOUS | @ 02:00:00 | Stop: 2021-08-03

## 2021-08-04 MED ADMIN — magnesium sulfate in water 2 gram/50 mL (4 %) IVPB 2 g: 2 g | INTRAVENOUS | @ 08:00:00 | Stop: 2021-08-04

## 2021-08-04 MED ADMIN — cefepime (MAXIPIME) 2 g in sodium chloride 0.9 % (NS) 100 mL IVPB-MBP: 2 g | INTRAVENOUS | @ 02:00:00 | Stop: 2021-08-03

## 2021-08-04 MED ADMIN — cefepime (MAXIPIME) 2 g in sodium chloride 0.9 % (NS) 100 mL IVPB-MBP: 2 g | INTRAVENOUS | @ 14:00:00 | Stop: 2021-08-04

## 2021-08-04 MED ADMIN — lactated ringers bolus 2,502 mL: 30 mL/kg | INTRAVENOUS | @ 02:00:00 | Stop: 2021-08-03

## 2021-08-04 MED ADMIN — lactated ringers bolus 500 mL: 500 mL | INTRAVENOUS | @ 08:00:00 | Stop: 2021-08-04

## 2021-08-04 MED ADMIN — acetaminophen (TYLENOL) tablet 1,000 mg: 1000 mg | ORAL | @ 03:00:00 | Stop: 2021-08-03

## 2021-08-04 MED ADMIN — potassium phosphate 21 mmol in sodium chloride (NS) 0.9 % 250 mL infusion: 21 mmol | INTRAVENOUS | @ 08:00:00 | Stop: 2021-08-04

## 2021-08-04 MED ADMIN — vancomycin (VANCOCIN) 3,000 mg in sodium chloride (NS) 0.9 % 1,000 mL IVPB: 3000 mg | INTRAVENOUS | @ 03:00:00

## 2021-08-04 NOTE — Unmapped (Shared)
Received sign out from previous provider.    Patient Summary: Gary Baird is a 81 y.o. 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 difficile infection, anemia, who presents to the [facility] emergency department for evaluation of malaise, fever, nausea and vomiting and abdominal pain.  Patient initially presented to urgent care found to be febrile to 102 and referred him to the emergency department for further evaluation.  In the emergency department, patient had fever of 100.7 as well as tachycardic to 130 A-fib w/ RVR. He was started on sepsis protocol and received 30 cc/kg fluids as well as broad-spectrum antibiotics.  Work-up so far unclear for source of fever.  Labs so far concerning for possible blast crisis. Significant leukocytosis with WBC of 75.4 up from 2.6 just 4 weeks ago.  Smear unable to be obtained overnight for differential to look at blasts we will follow that up today.  Patient also with significant electrolyte abnormalities including hypokalemia at 3.1, hypophosphatemia at 1.9, and hypomagnesia to 1.3 which were all repleted.  DIC labs were obtained which did show significant elevation in D-dimer at 36, 664.  Fibrinogen and PTT within normal limits.  INR mildly elevated to 1.63.  Patient was paged out to heme-onc who currently does not have a bed available on their team.  Overnight, discussion was had with MICU team who feels the patient is currently a stable for stepdown.  Patient still pending bed.    Action List:   Awaiting admitting team placement    Updates  ED Course as of 08/18/2021 1132   Thu Aug 04, 2021   1610 On reassessment, patient denies any abdominal pain, chest pain, or shortness of breath at this time.  He has had increased tachypnea to the 30s as well as increased hypoxia now on 5 L nasal cannula and continues to go in and out of A-fib with RVR currently at a rate of 130s.  Initial concerns for pulmonary edema given aggressive fluid resuscitation but lungs are clear bilaterally without any crackles.  Rechecked temperature to see if associated with fever but temperature remains afebrile at 98.6.  Reached out to MICU team given continued decompensation.   1102 Patient continues to be hypoxic to 88% now on 6 L nasal cannula.  Will transition to large bore [place] and message MICU regarding decompensation in the ED. Plan for CXR to reassess possible worsening pulmonary edema in the setting of fluid resuscitation last night.   1130 CXR with mildly increased opacities concerning for worsening pulmonary edema.  Hypoxia is improved on large bore nasal cannula at 11 L/min.  Lungs with mild crackles at bases.  Patient remains tachycardic to the 140s blood pressure 100/57.  Concern for reducing fluids in the setting of worsening pulmonary function.  Will obtain CTA chest in setting of positive D-dimer tachycardia and worsening hypoxia with soft pressures and leukocytosis possibly contributing to hypercoagulopathy to evaluate for PE.

## 2021-08-04 NOTE — Unmapped (Cosign Needed)
Malignant Hematology Consult Note    Requesting Attending Physician :  Rodrigo Ran, MD  Service Requesting Consult : Emergency Medicine  Reason for Consult: Acute leukemia, hx CMML c/b AML  Primary Oncologist: Cindra Presume, MD    Assessment: Gary Baird is a 81 y.o. male with hx CMML c/b AML (ASXL1, SRSF2, and TET2 mutation) on azacitidine/venetoclax, admitted for leukocytosis and acute leukemia. Oncology was consulted for evaluation of acute leukemia.    Patient's presentation is concerning for recurrence of AML on azacitidine/venetoclax. 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 Hydroxyurea 2g BID has been initiated in the hospital, though the patient has indicated that he does not want to be in the hospital given his limited prognosis, nor would he want to be in a hospice facility, emphasizing the importance to him of being at home with family. On discussion with ICU team, currently are arranging home hospice, which we support.    Recommendations:  - Continue hydroxyurea 2g BID at home  - Agree with home hospice  - Will notify Dr. Vertell Limber    This patient has been staffed with Dr. Leotis Pain. These recommendations were discussed with the primary team.     Please contact the oncology consult fellow at [phone] with any further questions.    Modesto Charon, MD  Hematology/Oncology, PGY-4    -------------------------------------------------------------    HPI: Gary Baird is a 81 y.o. male and who is being seen at the request of Rodrigo Ran, MD for evaluation of AML.    History of CMML with monthly azacitidine, c/b GI bleeding and leukocytosis with admission 01/29/2021, BMBx demonstrated 53% blasts, normal cytogenetics, and mutations in ASXL1, SRSF2, and TET2 c/w AML transformation. He began treatment on Aza/Ven on 02/14/2021. He was found to be in MLFS after cycle 1 without MRD. His cycle 2 was delayed due to cytopenias, and course c/b CDiff infection requiring hospitalization and a course of fidaxomycin in mid-April. Last seen by Dr. Vertell Limber 07/07/21. At this time, proceeded with C5 azacitidine 75mg /m2 x7d and venetoclax 400mg  q21d with [name].    Presented to [facility] ED in setting of malaise, abdominal pain, nausea and emesis x2, RUQ abdominal pain, Tmax at home of 101. Tachycardic to 130s with softer BPs, normal O2 saturation.RUQUS showed no GB abnromalities, CXR showed no focal consolidation. Labs significant for WBC 75.4 from 2.6 on 5.4 (87% blasts on differential), Hgb 9.4 ,Plt 59 from 263; K 3.1 Cr 1.16 from 0.7 baseline, Ca 7.1 Phos 1.9, Mg 1.3, uric acid 7.8, INR 1.6, fibrinogen 201. Hematopathology sent on peripheral blood, pending. BCx x2 obtained, cefepime and vancomycin initiated.    This AM, began having worsening shortness of breath. After discussion with ICU, ICU team has admitted him. 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 No chest pain, no confusion, breathing helped with supplemental O2.    Review of Systems: All positive and pertinent negatives are noted in the HPI; a 10 system review of systems was otherwise negative except as noted in HPI.    Oncologic History:  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)    Presentation: Incidentally found to have  elevated WBC prior to procedure    Labs- WBC = 25.2  Hb = 8.2 g/dL  Platelets = 540  98% monocytes    Bone marrow biopsy- 09/04/2018  Hypercellular marrow- 70-90% cellular, 7% blasts by manual aspirate differential- monocytic appearance, granulocytic dysplasia, dysplasia of megakaryocytes    Final Diagnosis   Date Value Ref Range Status   09/04/2018   Final    (Outside Case #:  [id number], dated 09/04/2018)  Bone marrow,  aspiration and biopsy  -  Hypercellular bone marrow (90%) involved by chronic myelomonocytic leukemia-1 (9% blasts by manual aspirate differential) (see Comment)  -  Unifocal paratrabecular cluster of atypical CD17-positive cells (see Comment)  -  By outside report, cytogenetic analysis reveals a normal karyotype.  -  By outside report, next generation sequencing for myeloid disorders reveals pathogenic or likely pathogenic variants in CBL (p.C384Y), SRSF2 (p.P95H), TET2 (O.[id number]*46), and SH2B3 (N.[id number]*13).    Peripheral blood, smear review  -  Leukocytosis with absolute monocytosis  -  Normocytic anemia  -  Thrombocytosis    This electronic signature is attestation that the pathologist personally reviewed the submitted material(s) and the final diagnosis reflects that evaluation.         Flow cytometry: No myeloblasts detectable, 16% monocytes- HLA-DR, CD11b, CD13, CD14, CD33, CD38, CD56, CD64 and without CD34 expression     Cytogenetics = Normal, 46,XY    Mutational Panel:   CBL C384Y- 19.0% VAF  SH2B3 S256Qfs- 58.8% VAF  SRSF2 P95H- 47.1% VAF  TET2 R1440- 86% VAF    Treatment: Azacitidine 75mg /m2 daily for 5 days monthly (09/30/18--12/31/20)        2. Diagnosis: Secondary AML     Developed hematochezia, fatigue, blurry vision, DOE at end of Nov 2022.  WBC 136, Hb 5.4, Pl 313, peripheral blasts 8%      Diagnosis   Date Value Ref Range Status   01/31/2021   Final    Bone marrow, right iliac, aspiration and biopsy  -  Hypercellular bone marrow (90%) consistent with involvement by acute myeloid leukemia (53% blasts by manual aspirate differential)  -  See linked reports for associated Ancillary Studies.      This electronic signature is attestation that the pathologist personally reviewed the submitted material(s) and the final diagnosis reflects that evaluation.          RESULTS   Date Value Ref Range Status   01/31/2021   Preliminary    Normal Karyotype: 46,XY[10]    Normal FISH:  An interphase FISH assay shows no evidence of a rearrangement involving the KMT2A (MLL) gene region in the 200 nuclei scored.            Myeloid Panel:   Variants of Known/Likely Clinical Significance:   Gene Coding Predicted Protein Variant allele fraction   ASXL1 c.2158del p.(Asp720Thrfs*5) 42.7 %   SRSF2 c.284C>A p.(Pro95His) 45.5 %   TET2 c.4317dupA p.(Arg1440Thrfs*38) 90.4 %     Cycle 1: 02/14/2021  Azacitidine 75mg /m2 x 7 days  Venetoclax 400mg  x 28 days    BMBx 03/08/21   Bone marrow, right iliac, aspiration and biopsy  -  Limited bone marrow sampling with increased megakaryocytes and less than 1% blasts on touch prep differential count (see Comment)   -  See linked reports for associated Ancillary Studies.    Cycle 2: 03/21/2021  Azacitidine 75mg /m2 x 7 days   Venetoclax 400mg  x 21 days    Cycle 3: 05/11/2021  Delayed due to neutropenia  Azacitidine 75  mg/m2 x 5 days  Venetoclax 400 mg x 21 days    Cycle 4: 06/13/2021  Azacitidine 75 mg/m2 x 5 days  Venetoclax 400 mg x 21 days       CMML (chronic myelomonocytic leukemia) (CMS-HCC)   09/19/2018 Initial Diagnosis    CMML (chronic myelomonocytic leukemia) (CMS-HCC)     Acute myeloid leukemia in remission (CMS-HCC)   02/04/2021 Initial Diagnosis    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)     02/14/2021 -  Chemotherapy    OP AML AZACITIDINE + VENETOCLAX  azacitidine 75 mg/m2 SQ on days 1-7, venetoclax ramp up Week 1 (dose dependent), then azacitidine 75 mg/m2 SQ on Days 1-7 every 28 days      Chemotherapy    OP AML AZACITIDINE + VENETOCLAX      azacitidine 75 mg/m2 SQ on days 1-7, venetoclax ramp up Week 1 (dose dependent), then azacitidine 75 mg/m2 SQ on Days 1-7 every 28 days         Past Medical History:   Diagnosis Date   ??? Cancer (CMS-HCC)    ??? Hypertension    ??? Leukemia (CMS-HCC)        Past Surgical History:   Procedure Laterality Date   ??? HERNIA REPAIR  1975   ??? PR COLONOSCOPY FLX DX W/COLLJ SPEC WHEN PFRMD N/A 02/02/2021    Procedure: COLONOSCOPY, FLEXIBLE, PROXIMAL TO SPLENIC FLEXURE; DIAGNOSTIC, W/WO COLLECTION SPECIMEN BY BRUSH OR WASH;  Surgeon: Mayford Knife, MD;  [facility];  Service: Gastroenterology   ??? PR ENDOSCOPY UPPER SMALL INTESTINE N/A 03/17/2021    Procedure: SMALL INTESTINAL ENDOSCOPY, ENTEROSCOPY BEYOND SECOND PORTION OF DUODENUM, NOT INCL ILEUM; DX, INCL COLLECTION OF SPECIMEN(S) BY BRUSHING OR WASHING, WHEN PERFORMED;  Surgeon: Vonda Antigua, MD;  Location: [facility];  Service: Gastroenterology   ??? PR GASTROINTESTINAL TRACT IMAGING ESOPHAGUS W/I&R N/A 02/02/2021    Procedure: GI PATENCY TRACT IMAGING, INTRALUMINAL (EG, CAPSULE ENDOSCOPY), ESOPHAGUS W/PHYSICIAN INTERP/REPORT;  Surgeon: Mayford Knife, MD;  Location: [facility];  Service: Gastroenterology   ??? PR GI IMAG INTRALUMINAL ESOPHAGUS-ILEUM W/I&R N/A 03/15/2021    Procedure: GI VIDEO TRACT IMAGE INTRALUMINAL (EG, CAPSULE ENDOSCOPY), ESOPHAGUS VIA ILEUM, PHYSICIAN INTERPRETATION & REPORT;  Surgeon: Monte Fantasia, MD;  Location: [facility];  Service: Gastroenterology   ??? PR UPPER GI ENDOSCOPY,DIAGNOSIS N/A 02/02/2021    Procedure: UGI ENDO, INCLUDE ESOPHAGUS, STOMACH, & DUODENUM &/OR JEJUNUM; DX W/WO COLLECTION SPECIMN, BY BRUSH OR WASH;  Surgeon: Mayford Knife, MD;  Location: [facility];  Service: Gastroenterology   ??? SKIN BIOPSY         Family History   Problem Relation Age of Onset   ??? Alzheimer's disease Father    ??? Melanoma Neg Hx    ??? Basal cell carcinoma Neg Hx    ??? Squamous cell carcinoma Neg Hx      Social History     Socioeconomic History   ??? Marital status: Divorced   ??? Number of children: 4   Tobacco Use   ??? Smoking status: Never   ??? Smokeless tobacco: Never   Vaping Use   ??? Vaping Use: Never used   Substance and Sexual Activity   ??? Alcohol use: Not Currently   ??? Drug use: Never   ??? Sexual activity: Yes     Partners: Female   Other Topics Concern   ??? Do you use sunscreen? No   ???  Tanning bed use? No   ??? Are you easily burned? No   ??? Excessive sun exposure? Yes   ??? Blistering sunburns? No     Social Determinants of Health     Financial Resource Strain: Low Risk    ??? Difficulty of Paying Living Expenses: Not very hard   Food Insecurity: No Food Insecurity   ??? Worried About Running Out of Food in the Last Year: Never true   ??? Ran Out of Food in the Last Year: Never true   Transportation Needs: No Transportation Needs   ??? Lack of Transportation (Medical): No   ??? Lack of Transportation (Non-Medical): No       Social History     Social History Narrative   ??? Not on file       Allergies: has No Known Allergies.  Medications:   Meds:  ??? potassium phosphate  21 mmol Intravenous Once     Continuous Infusions:  PRN Meds:.    Objective:   Vitals: Temp:  [36.6 ??C (97.9 ??F)-38.2 ??C (100.7 ??F)] 37 ??C (98.6 ??F)  Heart Rate:  [113-142] 128  SpO2 Pulse:  [84-124] 124  Resp:  [18-38] 33  BP: (94-112)/(49-97) 112/60  MAP (mmHg):  [61-103] 75  SpO2:  [84 %-96 %] 91 %  No intake/output data recorded.    Physical Exam:  BP 112/60  - Pulse 128  - Temp 37 ??C (98.6 ??F) (Oral)  - Resp (!) 33  - SpO2 91%    General appearance - alert, well appearing, and in no distress   Mental status - AOx3  Eyes - EOMI  Mouth - Mucous membranes dry  Neck - supple   Pulmonary - Normal WOB on HFNC  Cardiovascular- Tachycardic  Neurological - Alert, oriented x3    ECOG Performance Status: 3 - Capable of only limited selfcare, confined to bed or chair more than 50% of waking hours    Test Results  Lab Results   Component Value Date    WBC 75.4 (HH) 08/11/2021    HGB 9.4 (L) 08/29/2021    HCT 28.0 (L) 08/08/2021    PLT  08/26/2021      Comment:      Pending.     Lab Results   Component Value Date    NA 144 08/31/2021    K 3.1 (L) 08/11/2021    CL 112 (H) 08/17/2021    CO2 22.0 08/13/2021    BUN 5 (L) 08/16/2021    CREATININE 1.16 (H) 08/10/2021    GLU 111 08/08/2021    CALCIUM 7.1 (L) 08/24/2021    MG 1.3 (L) 08/27/2021 PHOS 1.9 (L) 08/16/2021     Lab Results   Component Value Date    BILITOT 0.7 08/03/2021    BILIDIR 0.20 04/28/2021    PROT 7.6 08/03/2021    ALBUMIN 3.3 (L) 08/03/2021    ALT 10 08/03/2021    AST 38 (H) 08/03/2021    ALKPHOS 199 (H) 08/03/2021     Lab Results   Component Value Date    INR 1.63 08/26/2021    APTT 30.2 08/12/2021       Imaging: Radiology studies were personally reviewed

## 2021-08-04 NOTE — Unmapped (Signed)
Recent:   What is the date of your last related visit?  08/05/21 bone biopsy   Related acute medications Rx'd:  N/A   Home treatment tried:  N/A       Relevant:   Allergies: Patient has no known allergies.  Medications: N/A   Health History: leukemia   Weight: Not Asked       [place] [facility] patients only:  What was the date of your last cancer treatment (mm/dd/yy)?: due on 08/08/21 last noted 1 month ago   Was the treatment oral or infusion?: chemo pills and infusion   Are you currently on TVEC (yes/no)?: N/A     Reason for Disposition   [1] Neutropenia known or suspected (e.g., recent cancer chemotherapy) AND [2] fever > 100.4 F (38.0 C)    Answer Assessment - Initial Assessment Questions  1. SEVERITY: How bad is the vomiting? How many times have you vomited in the past 24 hours? How bad is your nausea?     - MILD:  1 - 2 times/day     - MODERATE: 3 - 5 times/day, decreased oral intake without significant weight loss or symptoms of dehydration     - SEVERE: 6 or more times/day, vomits everything or nearly everything, with significant weight loss, symptoms of dehydration       2 times (one time on 08/02/21) and one time 08/03/21 @ 0800   2. ONSET: When did the nausea and/or vomiting begin?       08/02/21   3. MEDICINES FOR NAUSEA: Do you have any anti-nausea medicine? What medicines have you taken and when did you last take the medicine?      prochlorperazine (COMPAZINE) 10 MG noted in the chart the pt daughter is not sure of the name or if he has taken any  (has not taken)   4. ORAL INTAKE: If vomiting, Have you been able to keep any foods or fluids down? How much fluids have you had in the past 24 hours?      Has not been able to keep anything down all he has been doing has been laying around other than going to get blood work done, and will sit in front of the [facility] because he is hot   5. HYDRATION: Any signs of dehydration? (e.g., dry mouth [not just dry lips], too weak to stand, dizziness, new weight loss) When did you last urinate?      Uses a cane to ambulate and has gotten worse today, slight dizziness, ,,membranes are his saliva is thick 08/03/21 @ 1330 last void   6. ABDOMEN PAIN: Are you having any abdomen pain? If Yes, ask: How bad is it? What   does it feel like? (e.g., cramps, dull, intermittent, constant)       Denies   7. DIARRHEA: Is there any diarrhea? If Yes, ask: How many times today?       Denies   8. CANCER: What type of cancer do you have?       Leukemia   9. CANCER - TREATMENT: What cancer treatments have you received? When did you last receive them? (e.g., chemotherapy, immunotherapy, radiation, or recent surgery). If triager has access to the patient's medical record, triager should review treatments and administration dates.      Oral and infusion   10. CANCER - NEUTROPENIA RISK: Have you received chemotherapy recently? If Yes, ask: When was it and what was your last WBC and ANC (absolute neutrophil count)? Were you told  that your white cell count was low? If triager has access to the patient's medical record, triager should review most recent labs. An ANC less than 1,000 - 1,500 means that the neutrophils are low and the immune system is weak.        His numbers were not good   11. CAUSE: What do you think is causing your nausea and/or vomiting? (e.g., chemotherapy, new medications, the cancer itself, other family members with similar symptoms)        Unsure   12. PREVIOUS NAUSEA AND VOMITING:  If you have had nausea before, what have you done in the past to make it better?        He has had it in the past and had to go to the ER and he is admitted and put on Abx and comes back home   16. OTHER SYMPTOMS: Do you have any other symptoms? (e.g., fever, headache, vertigo,   vomiting blood, coffee ground vomit, abdominal swelling, jaundice)        Dizziness , 101.0 @ 1739 forehead    Protocols used: Cancer - Nausea and Vomiting-A-AH

## 2021-08-04 NOTE — Unmapped (Signed)
C/o fever X 1 day .  Cancer pt.

## 2021-08-04 NOTE — Unmapped (Signed)
ED Procedure Note    Critical Care  Performed by: Rodrigo Ran, MD  Authorized by: Rodrigo Ran, MD     Critical care provider statement:     Critical care time (minutes):  60    Critical care start time:  08/03/2021 8:34 PM    Critical care end time:  08/03/2021 11:00 PM    Critical care time was exclusive of:  Separately billable procedures and treating other patients and teaching time    Critical care was necessary to treat or prevent imminent or life-threatening deterioration of the following conditions:  Shock    Critical care was time spent personally by me on the following activities:  Blood draw for specimens, development of treatment plan with patient or surrogate, discussions with consultants, evaluation of patient's response to treatment, obtaining history from patient or surrogate, ordering and review of laboratory studies, ordering and review of radiographic studies and re-evaluation of patient's condition    I assumed direction of critical care for this patient from another provider in my specialty: no

## 2021-08-04 NOTE — Unmapped (Signed)
Bed: 69-D  Expected date:   Expected time:   Means of arrival:   Comments:  15

## 2021-08-04 NOTE — Unmapped (Signed)
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 male with pmh notable for CMML, CAD, pancytopenia, prior neutropenia, prior C. difficile infection, anemia, who presents to the [facility] emergency department for evaluation of malaise, fever, nausea and vomiting and abdominal pain as described below in the HPI.      On exam, vital signs are notable for tachycardia to the 130s to 140s and blood pressure at 95/69.  Patient is afebrile here, not tachypneic or hypoxic.  He is saturating between 94 to 96% on room air.  He is ill-appearing, tired appearing.  The patient has tenderness to palpation in the right upper quadrant without additional tenderness, no rebound, guarding, rigidity.  No lower extremity edema, no unilateral leg swelling.  No back tenderness and normal neurologic exam including the lower extremities.    Given tachycardia to the 140s and lower range blood pressure in the setting of fever, concern for sepsis.  Given abdominal pain specifically in the right upper quadrant, concern for right upper quadrant pathology including cholecystitis, cholangitis, among other possible etiologies.  Will evaluate broadly with blood cultures x2, labs, urinalysis, chest x-ray, RPP.    We will give 30 cc/kg fluid bolus and initiate broad-spectrum antibiotics.    Given vital sign derangements, concern for sepsis, anticipate admission.      BP 87/65  - Pulse 140  - Temp 37 ??C (98.6 ??F) (Oral)  - Resp (!) 35  - SpO2 95%       ED Course as of 08/05/21 0558   Wed Aug 03, 2021   2128 Potassium(!): 2.7  Repleting     2128 Patient is in A-fib with RVR.  Patient with no prior history, unknown when in normal sinus rhythm.  Will treat with fluids for presumed dehydration and sepsis and plan for reassessment.  Patient at this time is ill-appearing but overall stable, no indication for cardioversion at this time.   2340 Called by lab, patient WBC 68k, patient is also hypoxic at this time now requiring 3 L by nasal cannula.  Possibly secondary to leukostasis, chest x-ray is clear.  Somewhat unclear etiology of his hypoxia.  No concern at this time for DIC, but given patient's leukocytosis to 68,000, concern for possible blast crisis or DIC, will send off DIC panel for inpatient purposes. TLS labs as well.    Thu Aug 04, 2021   1610 Patient has remained in A-fib with RVR.  He very briefly converted to sinus rhythm with rates of approximately 118.  Concerned that sepsis is driving his tachycardia and that his low range blood pressure is secondary to sepsis rather than rapid ventricular response.  We will plan for bedside cardiac ultrasound for further characterization of cardiac function.  Holding off on rate control at this time as I am concerned this will cause worsening of hypotension if his tachycardia is response to distributive shock.   0129 Patient now tachycardic to 125.  MAP of 80 at this time.  Appears to be in sinus rhythm on the monitor.  Will page for medical admission for sepsis.   9604 Notified  by the medical admitting officer that the oncology team is capped and the patient will not be assigned a team at this time.  I voiced my concerns to the patient is septic, concerns for possible blast crisis, borderline vital signs.  Will page oncology for ongoing recommendations for treatment   [phone] Oncology paged   [phone] Spoke with oncology Dr. Modesta Messing.  She reports she will look at the peripheral smear in the morning.  Otherwise I asked for general recommendations for ongoing care, any additional treatment or work-up recommendations.  She advises ongoing fluids and antibiotics. No additional recommendations.  Reports no indication to see the patient at this time   0220 Advised by MAO patient will not be admitted until after morning discharges from oncology service    [phone] Patient reassessed at this time, resting in bed.  Blood pressure at this time is MAP of 59, then immediately repeated and was 62.  Patient is in sinus rhythm, tachycardic to 121.  We will have the patient evaluated by the intensivist service.   77 Spoke with intensivist team who.  They evaluate the patient.  They feel that he is stepdown appropriate at this time.  They requested repeat DIC labs, repeat electrolytes.     0700 -patient signed out to oncoming resident physician.    The case was discussed with attending physician who is in agreement with the above assessment and plan    Additional Medical Decision Making     Discussion of Management with other Physicians, QHP, or Appropriate Source: Admitting team - spoke with admitting team for admission consultation  Independent Interpretation of Studies: EKG(s) - see below  External Records Reviewed: I have reviewed recent and relevant previous record, including: Outpatient notes - evaluated outpatient note from 07/07/2021 for prior medical history          Portions of this record have been created using Dragon dictation software. Dictation errors have been sought, but may not have been identified and corrected.  ____________________________________________       History     Chief Complaint  Fever Between 60 Weeks and 82 Years Old        HPI: Gary Baird is a 81 y.o. male who presents to the [facility] emergency department for evaluation of malaise, fever, abdominal pain, nausea and vomiting.  Patient reports that his vomiting started on Monday (2 days ago).  He has had 3-4 episodes per day.  He reports not being will hold down solid or liquid p.o.  He reports pain in the right upper quadrant that is intermittent.  He denies diarrhea, melena, hematochezia.  He does not report any dysuria.  Denies cough, sore throat, shortness of breath. Denies chest pain.  Patient's wife provides additional and unique history.  She reports fever of Tmax 101 today.  Patient otherwise reports feeling very fatigued and malaised.      PAST MEDICAL HISTORY/PAST SURGICAL HISTORY:   Past Medical History:   Diagnosis Date   ??? Cancer (CMS-HCC)    ??? Hypertension    ??? Leukemia (CMS-HCC)        Past Surgical History:   Procedure Laterality Date   ??? HERNIA REPAIR  1975   ??? PR COLONOSCOPY FLX DX W/COLLJ SPEC WHEN PFRMD N/A 02/02/2021    Procedure: COLONOSCOPY, FLEXIBLE, PROXIMAL TO SPLENIC FLEXURE; DIAGNOSTIC, W/WO COLLECTION SPECIMEN BY BRUSH OR WASH;  Surgeon: Mayford Knife, MD;  [facility]: [facility];  Service: Gastroenterology   ??? PR ENDOSCOPY  UPPER SMALL INTESTINE N/A 03/17/2021    Procedure: SMALL INTESTINAL ENDOSCOPY, ENTEROSCOPY BEYOND SECOND PORTION OF DUODENUM, NOT INCL ILEUM; DX, INCL COLLECTION OF SPECIMEN(S) BY BRUSHING OR WASHING, WHEN PERFORMED;  Surgeon: Vonda Antigua, MD;  Location: [facility];  Service: Gastroenterology   ??? PR GASTROINTESTINAL TRACT IMAGING ESOPHAGUS W/I&R N/A 02/02/2021    Procedure: GI PATENCY TRACT IMAGING, INTRALUMINAL (EG, CAPSULE ENDOSCOPY), ESOPHAGUS W/PHYSICIAN INTERP/REPORT;  Surgeon: Mayford Knife, MD;  Location: [facility];  Service: Gastroenterology   ??? PR GI IMAG INTRALUMINAL ESOPHAGUS-ILEUM W/I&R N/A 03/15/2021    Procedure: GI VIDEO TRACT IMAGE INTRALUMINAL (EG, CAPSULE ENDOSCOPY), ESOPHAGUS VIA ILEUM, PHYSICIAN INTERPRETATION & REPORT;  Surgeon: Monte Fantasia, MD;  Location: [facility];  Service: Gastroenterology   ??? PR UPPER GI ENDOSCOPY,DIAGNOSIS N/A 02/02/2021    Procedure: UGI ENDO, INCLUDE ESOPHAGUS, STOMACH, & DUODENUM &/OR JEJUNUM; DX W/WO COLLECTION SPECIMN, BY BRUSH OR WASH;  Surgeon: Mayford Knife, MD;  Location: [facility];  Service: Gastroenterology   ??? SKIN BIOPSY         MEDICATIONS:   No current facility-administered medications for this encounter.    Current Outpatient Medications:   ???  allopurinol (ZYLOPRIM) 300 MG tablet, Take 1 tablet (300 mg total) by mouth daily., Disp: 30 tablet, Rfl: 2  ???  hydroxyurea (HYDREA) 500 mg capsule, Take 4 capsules (2,000 mg total) by mouth Two (2) times a day., Disp: 360 capsule, Rfl: 0  ???  LORazepam (ATIVAN) 0.5 MG tablet, Take 1 tablet (0.5 mg total) by mouth every three (3) hours as needed for anxiety for up to 5 days., Disp: 10 tablet, Rfl: 0  ???  melatonin 10 mg cap, Take 1 capsule (10 mg total) by mouth nightly., Disp: , Rfl:   ???  MORPhine 20 mg/mL concentrated solution, Take 0.25 mL (5 mg total) by mouth every two (2) hours as needed for pain for up to 10 days. May also be used for air hunger, Disp: 30 mL, Rfl: 0  ???  pantoprazole (PROTONIX) 40 MG tablet, Take 1 tablet (40 mg total) by mouth daily., Disp: , Rfl:   ???  prochlorperazine (COMPAZINE) 10 MG tablet, Take 1 tablet (10 mg total) by mouth every six (6) hours as needed for nausea., Disp: 30 tablet, Rfl: 2  ???  traZODone (DESYREL) 50 MG tablet, Take 1 tablet (50 mg total) by mouth nightly., Disp: , Rfl:   ???  valACYclovir (VALTREX) 500 MG tablet, Take 1 tablet (500 mg total) by mouth daily., Disp: , Rfl:     ALLERGIES:   Patient has no known allergies.    SOCIAL HISTORY:   Social History     Tobacco Use   ??? Smoking status: Never   ??? Smokeless tobacco: Never   Substance Use Topics   ??? Alcohol use: Not Currently       FAMILY HISTORY:  Family History   Problem Relation Age of Onset   ??? Alzheimer's disease Father    ??? Melanoma Neg Hx    ??? Basal cell carcinoma Neg Hx    ??? Squamous cell carcinoma Neg Hx           Physical Exam     VITAL SIGNS:    BP 87/65  - Pulse 140  - Temp 37 ??C (98.6 ??F) (Oral)  - Resp (!) 35  - SpO2 95%     Constitutional: Patient is alert and oriented. Patient is laying in  bed and is ill appearing, nontoxic, and in no acute distress.  Eyes: Conjunctivae are normal.  ENT       Head: Normocephalic and atraumatic. Nose: No congestion.       Mouth/Throat: Mucous membranes are dry.       Neck: Normal ROM.   Cardiovascular: Tachycardic rate, regular rhythm. Normal and symmetric distal pulses are present in all extremities.  Respiratory: Normal respiratory effort. Breath sounds are normal.  Gastrointestinal: Soft and tender to palpation in the right upper quadrant without rebound, guarding, rigidity.  No other tenderness noted.   Genitourinary: No suprapubic tenderness. There is no CVA tenderness.  Musculoskeletal: Normal range of motion in all extremities.  No tenderness to palpation along C, T, L-spine.       Right lower leg: No tenderness or edema.       Left lower leg: No tenderness or edema.  Neurologic: Normal speech and language. No gross focal neurologic deficits are appreciated.  Lower extremities with 5/5 strength, normal sensation.  Skin: Skin is warm, dry and intact. No rash noted.  Psychiatric: Mood and affect are normal. Speech and behavior are normal.      EKG     My read:     tachycardic rate, afib  Normal axis  QRS interval within normal limits, Qtc prolonged at 545  No ST segment elevations or depressions  Nonspecific T wave abnormalities      Radiology       XR Chest Portable   Final Result      Hypoinflated lungs with bibasilar atelectasis.   Mild interstitial pulmonary edema.      ED POCUS RUSH Protocol Emergency         ED POCUS Right Upper Quadrant         XR Chest 1 view Portable   Final Result      Low lung volumes with bibasilar atelectasis. No focal consolidation.           XR Chest Portable   Final Result      Hypoinflated lungs with bibasilar atelectasis.   Mild interstitial pulmonary edema.      ED POCUS RUSH Protocol Emergency         ED POCUS Right Upper Quadrant         XR Chest 1 view Portable   Final Result      Low lung volumes with bibasilar atelectasis. No focal consolidation.           ECG 12 Lead    Result Date: 08/03/2021  ATRIAL FIBRILLATION WITH RAPID VENTRICULAR RESPONSE ST & T WAVE ABNORMALITY, CONSIDER LATERAL ISCHEMIA ABNORMAL ECG WHEN COMPARED WITH ECG OF 16-Jun-2021 21:48, ATRIAL FIBRILLATION HAS REPLACED SINUS RHYTHM T WAVE INVERSION NOW EVIDENT IN ANTEROLATERAL LEADS    XR Chest Portable    Result Date: 08/13/2021  EXAM: XR CHEST PORTABLE DATE: 08/28/2021 11:44 AM ACCESSION: [id number] UN DICTATED: 08/29/2021 11:45 AM INTERPRETATION LOCATION: [facility] CLINICAL INDICATION: 81 years old Male with HYPOXEMIA  TECHNIQUE: Single View AP Chest Radiograph. COMPARISON: Chest radiograph from prior day FINDINGS: Low lung volumes. Mild pulmonary edema with pulmonary vascular congestion. Mild bibasilar atelectasis/scarring. No focal consolidation. No pleural effusion or pneumothorax. Stable, enlarged cardiomediastinal silhouette.     Hypoinflated lungs with bibasilar atelectasis. Mild interstitial pulmonary edema.    XR Chest 1 view Portable    Result Date: 08/03/2021  EXAM: XR CHEST PORTABLE DATE: 08/03/2021 9:34 PM ACCESSION: [id number] UN DICTATED: 08/03/2021 10:02 PM INTERPRETATION LOCATION: [facility] CLINICAL INDICATION:  81 years old Male with FEVER  TECHNIQUE: Single View AP Chest Radiograph. COMPARISON: Chest radiograph 06/16/2021 FINDINGS: Low lung volumes with prominence of the pulmonary vasculature and interstitium. No focal lung consolidation. Mild bibasilar atelectasis. No pleural effusion or pneumothorax. Stable cardiomediastinal silhouette. There are degenerative changes of the spine, left shoulder, and left acromioclavicular joint.     Low lung volumes with bibasilar atelectasis. No focal consolidation.     ED POCUS Right Upper Quadrant    Result Date: 08-22-21  Limited Biliary Ultrasound (CPT: [phone]) Indication: A focused ultrasound exam of the gallbladder and right upper quadrant was performed to evaluate for gallstones, cholecystitis, and cholestasis in this patient. The ultrasound was performed with the following indications, as noted in the H&P: abdominal pain Identified structures: The gallbladder, gallbladder wall, common bile duct, liver, and porta hepatis were examined. Findings: Exam of the above structures revealed the following findings: Gallstones: Absent Sludge: Absent Sonographic Murphy: Absent Pericholecystic fluid: Absent GB wall thickness: Normal        Maximal GB wall thickness in transverse plane: 2.7 millimeters Common bile duct diameter: Indeterminate Limitations: Technically difficult study Impression: Normal gallbladder ultrasound Other: none Interpreted by: Hollice Gong, MD      ED POCUS RUSH Protocol Emergency    Result Date: 22-Aug-2021  Limited Cardiac Ultrasound (CPT [phone]) Indication: A focused ultrasound exam of the heart was performed to evaluate for pericardial effusion, tamponade, severe hypovolemia, or gross abnormalities of cardiac anatomy or function in this patient. The ultrasound was performed with the following indications, as noted in the H&P: Hypotension Identified structures: The pericardial sac, myocardium, and 4 chambers were identified using the following views: parasternal long axis and parasternal short axis Findings: Exam of the above structures revealed the following findings:  Pericardial effusion: Absent   Pericardial tamponade: N/A  Global LV function: Hyperdynamic  Right ventricular size: Normal   Signs of RV strain: N/A  Other findings: none Limitations: None. Impression: No sonographic evidence of significant cardiac dysfunction, No sonographic evidence of significant pericardial effusion, Normal RV, and Global ventricular function:  Hyperdynamic Other: none Interpreted by: Hollice Gong, MD      Procedures     Procedure(s) performed: None.             Hollice Gong, MD  Resident  08/05/21 [phone]

## 2021-08-04 NOTE — Unmapped (Incomplete)
ED Progress Note    I reassumed care of this patient from Dr. Ardelle Anton at 0700 hrs.  The patient has not yet been assigned to medicine team and remains the responsibility of the emergency department.  The patient remains awake and alert.  Despite aggressive fluids, he remains tachycardic in the 1 28-1 40 range.  He remains tachypneic into the low to mid 30s.  His oxygen saturation is hovering at 90% on 5 L nasal cannula.  He is quite ill.  I have updated the medicine team about his continued tenuous state.  We have still not been assigned to team.    1100 hrs. The patient is decompensated a bit more.  He is tachycardic into the 140s and his oxygen saturation is in the upper 80s on 6 L nasal cannula.  We have bumped him up to 11 L nasal cannula.  The patient continues to get fluids and antibiotics.  I have again updated the medicine team about his deterioration.  I believe he needs an ICU team at this point.  We still have not been assigned to team.    1230 hrs. The patient has now been assigned to the MICU and the team is down here to see the patient.    1300 hrs. The MICU team has seen the patient and discussed goals of care with him.  They would like to transition him to home hospice. ***

## 2021-08-04 NOTE — Unmapped (Signed)
Regarding: Told to call if fever >101  ----- Message from Lyndee Hensen, RN sent at 08/03/2021  5:28 PM EDT -----  Call came in on pt. Discharge line, daughter states he goes to [facility] in [place].

## 2021-08-05 ENCOUNTER — Emergency Department
Admit: 2021-08-05 | Discharge: 2021-08-05 | Payer: MEDICARE | Attending: Student in an Organized Health Care Education/Training Program

## 2021-08-05 DIAGNOSIS — C9310 Chronic myelomonocytic leukemia not having achieved remission: Secondary | ICD-10-CM | POA: Diagnosis not present

## 2021-08-05 NOTE — Unmapped (Addendum)
Pt discharged and left via BLS ambulance. All paperwork/AVS left with patient.   AVS and discharge summary explained to patient. Pt verbalized understanding of discharge education. Pt left with 10L of o2 and left in no distress/discomfort. Home O2 awaiting at home, per CM. PIVs discontinued.  Pt left with all personal belongings- bags, cell phone, cane

## 2021-08-05 NOTE — Unmapped (Signed)
Physician Discharge Summary [facility]  [facility] 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 *      Hospital Course:   Gary Baird is a 81 y.o. 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 difficile, and GERD admitted for leukocytosis, fever, and acute leukemia with concern for blast crisis (87% blasts on differential). He developed worsening hypoxia after receiving fluids for presumed sepsis prior to blast crisis diagnosis. After communicating poor prognosis and discussing GOC with patient and family at bedside, decision was made to not proceed with ICU admission and instead go home with hospice care. Please see same day ACP note for any further details on healthcare decision making.      Outpatient Provider Follow Up Issues:   -to be addressed by hospice care team    Touchbase with Outpatient Provider:  Warm Handoff: Completed on Aug 22, 2021 by [name]  (Resident) via [facility]    Procedures:  None  ______________________________________________________________________  Discharge Medications:      Your Medication List        STOP taking these medications BELSOMRA 15 mg tablet  Generic drug: suvorexant     ferrous sulfate 325 (65 FE) MG tablet     VENCLEXTA 100 mg tablet  Generic drug: venetoclax            START taking these medications      hydroxyurea 500 mg capsule  Commonly known as: HYDREA  Take 4 capsules (2,000 mg total) by mouth Two (2) times a day.     LORazepam 0.5 MG tablet  Commonly known as: ATIVAN  Take 1 tablet (0.5 mg total) by mouth every three (3) hours as needed for anxiety for up to 5 days.     MORPhine 20 mg/mL concentrated solution  Take 0.25 mL (5 mg total) by mouth every two (2) hours as needed for pain for up to 10 days. May also be used for air hunger            CONTINUE taking these medications      allopurinol 300 MG tablet  Commonly known as: ZYLOPRIM  Take 1 tablet (300 mg total) by mouth daily.     melatonin 10 mg Cap  Take 1 capsule (10 mg total) by mouth nightly.     pantoprazole 40 MG tablet  Commonly known as: PROTONIX  Take 1 tablet (40 mg total) by mouth daily.     prochlorperazine 10 MG tablet  Commonly known as: COMPAZINE  Take 1 tablet (10 mg  total) by mouth every six (6) hours as needed for nausea.     traZODone 50 MG tablet  Commonly known as: DESYREL  Take 1 tablet (50 mg total) by mouth nightly.     valACYclovir 500 MG tablet  Commonly known as: VALTREX  Take 1 tablet (500 mg total) by mouth daily.              Allergies:  Patient has no known allergies.  ______________________________________________________________________  Pending Test Results:  Pending Labs       Order Current Status    Respiratory Pathogen Panel with COVID-19 Collected (08/03/21 2248)    Blood Culture In process    Blood Culture In process    Hematopathology Order In process            Most Recent Labs:  All lab results last 24 hours -   Recent Results (from the past 24 hour(s))   Comprehensive Metabolic Panel    Collection Time: 08/03/21  7:57 PM   Result Value Ref Range    Sodium 139 135 - 145 mmol/L    Potassium 2.7 (L) 3.4 - 4.8 mmol/L Chloride 106 98 - 107 mmol/L    CO2 24.0 20.0 - 31.0 mmol/L    Anion Gap 9 5 - 14 mmol/L    BUN 6 (L) 9 - 23 mg/dL    Creatinine 1.61 (H) 0.60 - 1.10 mg/dL    BUN/Creatinine Ratio 5     eGFR CKD-EPI (2021) Male 60 >=60 mL/min/1.27m2    Glucose 139 70 - 179 mg/dL    Calcium 8.9 8.7 - 09.6 mg/dL    Albumin 3.3 (L) 3.4 - 5.0 g/dL    Total Protein 7.6 5.7 - 8.2 g/dL    Total Bilirubin 0.7 0.3 - 1.2 mg/dL    AST 38 (H) <=04 U/L    ALT 10 10 - 49 U/L    Alkaline Phosphatase 199 (H) 46 - 116 U/L   COVID PCR    Collection Time: 08/03/21  7:57 PM    Specimen: Nasopharyngeal Swab   Result Value Ref Range    SARS-CoV-2 PCR Negative Negative   CBC w/ Differential    Collection Time: 08/03/21  7:57 PM   Result Value Ref Range    WBC 78.8 (HH) 3.6 - 11.2 10*9/L    RBC 4.20 (L) 4.26 - 5.60 10*12/L    HGB 11.9 (L) 12.9 - 16.5 g/dL    HCT 54.0 (L) 98.1 - 48.0 %    MCV 84.9 77.6 - 95.7 fL    MCH 28.4 25.9 - 32.4 pg    MCHC 33.5 32.0 - 36.0 g/dL    RDW 19.1 (H) 47.8 - 15.2 %    MPV 8.2 6.8 - 10.7 fL    Platelet 168 150 - 450 10*9/L    nRBC 2 /100 WBCs    Anisocytosis Marked (A) Not Present   Pathologist Smear Review    Collection Time: 08/03/21  7:57 PM   Result Value Ref Range    Pathologist Smear Interpretation  Confirmed by Hemepath Fellow, Confirmed by Hemepath Specialist/Senior Tech, Confirmed by Core Specialist/Senior Tech, To Be Accessioned - Case Report to Follow, Physician Requested Path Review - BF/CSF: cytospin routed to hemepath, Physician Requested...     Physician Requested Path Review - PB:  prepared slide and instrument printout routed to hemepath   Magnesium Level    Collection Time: 08/03/21  7:57 PM   Result Value Ref Range    Magnesium  1.4 (L) 1.6 - 2.6 mg/dL   Uric acid    Collection Time: 08/03/21  7:57 PM   Result Value Ref Range    Uric Acid 9.9 (H) 3.7 - 9.2 mg/dL   Phosphorus Level    Collection Time: 08/03/21  7:57 PM   Result Value Ref Range    Phosphorus 1.4 (L) 2.4 - 5.1 mg/dL   Manual Differential Collection Time: 08/03/21  7:57 PM   Result Value Ref Range    Neutrophils % 2 %    Lymphocytes % 10 %    Monocytes % 0 %    Eosinophils % 2 %    Basophils % 0 %    Blasts % 86 (HH) <=0 %    Absolute Neutrophils 1.6 (L) 1.8 - 7.8 10*9/L    Absolute Lymphocytes 7.9 (H) 1.1 - 3.6 10*9/L    Absolute Monocytes 0.0 (L) 0.3 - 0.8 10*9/L    Absolute Eosinophils 1.6 (H) 0.0 - 0.5 10*9/L    Absolute Basophils 0.0 0.0 - 0.1 10*9/L    Smear Review Comments See Comment (A) Undefined    Schistocytes Rare (A) Not Present    Acanthocytes Present (A) Not Present    Toxic Vacuolation Present (A) Not Present    Poikilocytosis Moderate (A) Not Present   Lactate Sepsis, Venous    Collection Time: 08/08/21  1:22 AM   Result Value Ref Range    Lactate, Venous 1.7 0.5 - 1.8 mmol/L   D-Dimer, Quantitative    Collection Time: 2021-08-08  1:22 AM   Result Value Ref Range    D-Dimer 39,389 (H) <=500 ng/mL FEU   Fibrinogen    Collection Time: 2021/08/08  1:22 AM   Result Value Ref Range    Fibrinogen 279 175 - 500 mg/dL   Protime-INR    Collection Time: 2021/08/08  1:22 AM   Result Value Ref Range    PT 17.6 (H) 9.8 - 12.8 sec    INR 1.53    APTT    Collection Time: 08/08/21  1:22 AM   Result Value Ref Range    APTT 30.2 25.1 - 36.5 sec    Heparin Correlation 0.2    Urinalysis with Microscopy    Collection Time: 08-08-21  4:33 AM   Result Value Ref Range    Color, UA Yellow     Clarity, UA Turbid     Specific Gravity, UA 1.026 1.003 - 1.030    pH, UA 6.0 5.0 - 9.0    Leukocyte Esterase, UA Negative Negative    Nitrite, UA Negative Negative    Protein, UA 100 mg/dL (A) Negative    Glucose, UA Trace (A) Negative    Ketones, UA 10 mg/dL (A) Negative    Urobilinogen, UA <2.0 mg/dL <2.9 mg/dL    Bilirubin, UA Negative Negative    Blood, UA Trace (A) Negative    RBC, UA 1 <=3 /HPF    WBC, UA 4 (H) <=2 /HPF    Squam Epithel, UA 3 0 - 5 /HPF    Bacteria, UA Rare (A) None Seen /HPF    Hyaline Casts, UA 74 (H) 0 - 1 /LPF    Mucus, UA Few (A) None Seen /HPF D-Dimer, Quantitative    Collection Time: 08-Aug-2021  5:26 AM   Result Value Ref Range    D-Dimer 56,213 (H) <=500 ng/mL FEU   Protime-INR    Collection Time: 2021/08/08  5:26 AM   Result Value Ref Range  PT 18.8 (H) 9.8 - 12.8 sec    INR 1.63    Ionized Calcium, Venous    Collection Time: August 05, 2021  5:26 AM   Result Value Ref Range    Calcium, Ionized Venous 4.10 (L) 4.40 - 5.40 mg/dL   Basic metabolic panel    Collection Time: 08-05-21  5:26 AM   Result Value Ref Range    Sodium 144 135 - 145 mmol/L    Potassium 3.1 (L) 3.4 - 4.8 mmol/L    Chloride 112 (H) 98 - 107 mmol/L    CO2 22.0 20.0 - 31.0 mmol/L    Anion Gap 10 5 - 14 mmol/L    BUN 5 (L) 9 - 23 mg/dL    Creatinine 6.57 (H) 0.60 - 1.10 mg/dL    BUN/Creatinine Ratio 4     eGFR CKD-EPI (2021) Male 63 >=60 mL/min/1.76m2    Glucose 111 70 - 179 mg/dL    Calcium 7.1 (L) 8.7 - 10.4 mg/dL   Magnesium Level    Collection Time: 08/05/21  5:26 AM   Result Value Ref Range    Magnesium 1.3 (L) 1.6 - 2.6 mg/dL   Phosphorus Level    Collection Time: 08-05-21  5:26 AM   Result Value Ref Range    Phosphorus 1.9 (L) 2.4 - 5.1 mg/dL   Uric acid    Collection Time: August 05, 2021  5:26 AM   Result Value Ref Range    Uric Acid 7.8 3.7 - 9.2 mg/dL   Fibrinogen    Collection Time: 05-Aug-2021  5:26 AM   Result Value Ref Range    Fibrinogen 201 175 - 500 mg/dL   CBC w/ Differential    Collection Time: 2021-08-05  5:42 AM   Result Value Ref Range    WBC 75.4 (HH) 3.6 - 11.2 10*9/L    RBC 3.28 (L) 4.26 - 5.60 10*12/L    HGB 9.4 (L) 12.9 - 16.5 g/dL    HCT 84.6 (L) 96.2 - 48.0 %    MCV 85.3 77.6 - 95.7 fL    MCH 28.5 25.9 - 32.4 pg    MCHC 33.4 32.0 - 36.0 g/dL    RDW 95.2 (H) 84.1 - 15.2 %    MPV 10.4 6.8 - 10.7 fL    Platelet 59 (L) 150 - 450 10*9/L    nRBC 1 /100 WBCs    Anisocytosis Marked (A) Not Present   Manual Differential    Collection Time: Aug 05, 2021  5:42 AM   Result Value Ref Range    Neutrophils % 2 %    Lymphocytes % 10 %    Monocytes % 0 %    Eosinophils % 1 %    Basophils % 0 % Blasts % 87 (HH) <=0 %    Absolute Neutrophils 1.5 (L) 1.8 - 7.8 10*9/L    Absolute Lymphocytes 7.5 (H) 1.1 - 3.6 10*9/L    Absolute Monocytes 0.0 (L) 0.3 - 0.8 10*9/L    Absolute Eosinophils 0.8 (H) 0.0 - 0.5 10*9/L    Absolute Basophils 0.0 0.0 - 0.1 10*9/L    Smear Review Comments See Comment (A) Undefined    Schistocytes Rare (A) Not Present    Acanthocytes Present (A) Not Present    Toxic Vacuolation Present (A) Not Present    Poikilocytosis Moderate (A) Not Present   Respiratory Pathogen Panel with COVID-19    Collection Time: 05-Aug-2021 12:35 PM   Result Value Ref Range    Adenovirus Not Detected Not Detected  Coronavirus HKU1 Not Detected Not Detected    Coronavirus NL63 Not Detected Not Detected    Coronavirus 229E Not Detected Not Detected    Coronavirus OC43 PCR Not Detected Not Detected    Metapneumovirus Not Detected Not Detected    Rhinovirus/Enterovirus Not Detected Not Detected    Influenza A Not Detected Not Detected    Influenza B Not Detected Not Detected    Parainfluenza 1 Not Detected Not Detected    Parainfluenza 2 Not Detected Not Detected    Parainfluenza 3 Not Detected Not Detected    Parainfluenza 4 Not Detected Not Detected    RSV Not Detected Not Detected    Bordetella pertussis Not Detected Not Detected    Bordetella parapertussis Not Detected Not Detected    Chlamydophila (Chlamydia) pneumoniae Not Detected Not Detected    Mycoplasma pneumoniae Not Detected Not Detected    SARS-CoV-2 PCR Not Detected Not Detected     CBC - Results in Past 2 Days  Result Component Current Result   WBC 75.4 (HH) (08/21/2021)   RBC 3.28 (L) (08/27/2021)   HGB 9.4 (L) (08/13/2021)   HCT 28.0 (L) (08/28/2021)   MCV 85.3 (08/30/2021)   MCH 28.5 (09/02/2021)   MCHC 33.4 (August 06, 2021)   Metapneumovirus Not Detected (08/17/2021)   Platelet 59 (L) (August 06, 2021)     BMP - Results in Past 2 Days  Result Component Current Result   Sodium 144 (08/14/2021)   Potassium 3.1 (L) ()   Chloride 112 (H) (06-Aug-2021)   CO2 22.0 (08/23/2021) BUN 5 (L) (08/18/2021)   Creatinine 1.16 (H) (Aug 06, 2021)   EST.GFR (MDRD) Not in Time Range   Glucose 111 (06-Aug-2021)     Coagulation - Results in Past 2 Days  Result Component Current Result   PT 18.8 (H) (08/05/2021)   INR 1.63 (08/08/2021)   APTT 30.2 (August 06, 2021)     LFT's - Results in Past 2 Days  Result Component Current Result   Albumin 3.3 (L) (08/03/2021)   ALT 10 (08/03/2021)   AST 38 (H) (08/03/2021)   Alkaline Phosphatase 199 (H) (08/03/2021)   Total Bilirubin 0.7 (08/03/2021)   Bilirubin, Direct Not in Time Range    Not in Time Range       Relevant Studies/Radiology:  ECG 12 Lead    Result Date: 08/03/2021  ATRIAL FIBRILLATION WITH RAPID VENTRICULAR RESPONSE ST & T WAVE ABNORMALITY, CONSIDER LATERAL ISCHEMIA ABNORMAL ECG WHEN COMPARED WITH ECG OF 16-Jun-2021 21:48, ATRIAL FIBRILLATION HAS REPLACED SINUS RHYTHM T WAVE INVERSION NOW EVIDENT IN ANTEROLATERAL LEADS    XR Chest Portable    Result Date: Aug 06, 2021  EXAM: XR CHEST PORTABLE DATE: 08/17/2021 11:44 AM ACCESSION: [id number] UN DICTATED: 08/05/2021 11:45 AM INTERPRETATION LOCATION: [facility] CLINICAL INDICATION: 81 years old Male with HYPOXEMIA  TECHNIQUE: Single View AP Chest Radiograph. COMPARISON: Chest radiograph from prior day FINDINGS: Low lung volumes. Mild pulmonary edema with pulmonary vascular congestion. Mild bibasilar atelectasis/scarring. No focal consolidation. No pleural effusion or pneumothorax. Stable, enlarged cardiomediastinal silhouette.     Hypoinflated lungs with bibasilar atelectasis. Mild interstitial pulmonary edema.    XR Chest 1 view Portable    Result Date: 08/03/2021  EXAM: XR CHEST PORTABLE DATE: 08/03/2021 9:34 PM ACCESSION: [id number] UN DICTATED: 08/03/2021 10:02 PM INTERPRETATION LOCATION: [facility] CLINICAL INDICATION: 81 years old Male with FEVER  TECHNIQUE: Single View AP Chest Radiograph. COMPARISON: Chest radiograph 06/16/2021 FINDINGS: Low lung volumes with prominence of the pulmonary vasculature and interstitium. No focal lung consolidation. Mild bibasilar atelectasis. No pleural effusion  or pneumothorax. Stable cardiomediastinal silhouette. There are degenerative changes of the spine, left shoulder, and left acromioclavicular joint.     Low lung volumes with bibasilar atelectasis. No focal consolidation.     ED POCUS Right Upper Quadrant    Result Date: 2021-08-09  Limited Biliary Ultrasound (CPT: [phone]) Indication: A focused ultrasound exam of the gallbladder and right upper quadrant was performed to evaluate for gallstones, cholecystitis, and cholestasis in this patient. The ultrasound was performed with the following indications, as noted in the H&P: abdominal pain Identified structures: The gallbladder, gallbladder wall, common bile duct, liver, and porta hepatis were examined. Findings: Exam of the above structures revealed the following findings: Gallstones: Absent Sludge: Absent Sonographic Murphy: Absent Pericholecystic fluid: Absent GB wall thickness: Normal        Maximal GB wall thickness in transverse plane: 2.7 millimeters Common bile duct diameter: Indeterminate Limitations: Technically difficult study Impression: Normal gallbladder ultrasound Other: none Interpreted by: Hollice Gong, MD      ED POCUS [place]USH Protocol Emergency    Result Date: Aug 09, 2021  Limited Cardiac Ultrasound (CPT [phone]) Indication: A focused ultrasound exam of the heart was performed to evaluate for pericardial effusion, tamponade, severe hypovolemia, or gross abnormalities of cardiac anatomy or function in this patient. The ultrasound was performed with the following indications, as noted in the H&P: Hypotension Identified structures: The pericardial sac, myocardium, and 4 chambers were identified using the following views: parasternal long axis and parasternal short axis Findings: Exam of the above structures revealed the following findings:  Pericardial effusion: Absent   Pericardial tamponade: N/A  Global LV function: Hyperdynamic  Right ventricular size: Normal   Signs of RV strain: N/A  Other findings: none Limitations: None. Impression: No sonographic evidence of significant cardiac dysfunction, No sonographic evidence of significant pericardial effusion, Normal RV, and Global ventricular function:  Hyperdynamic Other: none Interpreted by: Hollice Gong, MD   ______________________________________________________________________  Discharge Instructions:   Activity Instructions       Activity as tolerated                       Follow Up instructions and Outpatient Referrals     Ambulatory referral to Hospice      Facility Type: Home-based    Did you call Hospice before placing this order?: Yes    Do you want ongoing co-management?: Yes    Care coordination required?: No    Discharge instructions          Appointments which have been scheduled for you      Aug 09, 2021 To Be Determined  SN - HOSPICE ADMISSION EVAL with Jamesetta So, RN  [facility] AND HOSPICE [facility]) [address]  [address]  [place] [address]  [phone]        Aug 05, 2021 10:30 AM  (Arrive by 10:00 AM)  LAB ONLY [place] with ADULT ONC LAB  [facility] ADULT ONCOLOGY LAB DRAW STATION [facility] REGION) [address]  [place] [address]  [phone]        Aug 05, 2021 11:30 AM  (Arrive by 11:00 AM)  BONE MARROW BIOPSY with Vernie Murders, AGNP  [place] ONCOLOGY INFUSION [facility] REGION) [address]  [place] [address]  [phone]             ______________________________________________________________________  Discharge Day Services:  BP 106/70  -  Pulse 147  - Temp 36.7 ??C (98 ??F) (Oral)  - Resp (!) 47  - SpO2 96%     Pt seen on the day of discharge and determined appropriate for discharge.    Condition at Discharge: stable    Length of Discharge: I spent greater than 30 mins in the discharge of this patient.

## 2021-08-05 NOTE — Unmapped (Signed)
ADVANCE CARE PLANNING NOTE    Discussion Date:  August 20, 2021    Patient has decisional capacity:  Yes    Patient has selected a Health Care Decision-Maker if loses capacity: Yes    Health Care Decision Maker as of Aug 20, 2021    HCDM (patient stated preference): Gary Baird - Daughter - [phone]    Discussion Participants:  Patient Gary Baird.)  Myself Christiane Ha Polly Barner, PGY-2)  All of patient's daughters (including Gary Baird, his designated HCDM)    Communication of Medical Status/Prognosis:   Given the patient had previously been DNR/DNI and carried a poor prognosis in the setting of progressing AML with blast crisis through palliative chemotherapy, a family meeting was arranged to discuss goals of care prior to admission to ICU.  Prior to this meeting, I touched base with the oncology fellow and team to confirm patient's poor prognosis and lack of treatment options.  I shared the news with the family that unfortunately, Gary Baird's blood work showed a progression of his AML with a significant increase in his white blood cell count and that his fevers were likely related to either progression of his cancer in the setting of blast crisis or a superimposed infection in the setting of his immunocompromised state.  Gary Baird and his family shared that Dr. Vertell Limber had previously informed them of his poor prognosis given CMML transforming to AML and that his current chemotherapy regimen would potentially not be effective.    Communication of Treatment Goals/Options:   We discussed that there was not a chemotherapy option available that could provide meaningful prolongation of life at this point.  We discussed that hydroxyurea and plasmapheresis, if offered, would be very intensive and likely result in ICU admission with interventions that can potentially be painful/uncomfortable including line placement and continuous fluids with diuresis.  We also discussed that these interventions would provide a very limited extension of life in the manner of days to week and could potentially make it very difficult to return home.    Treatment Decisions:   Gary Baird shared that he did not desire to have such interventions and that it was very important for him that he passed in his home.  He strongly desires to avoid the ICU as well as avoid any kind of hospice facility, instead hoping to get home with hospice and make the most of the time he has.  -Proceed with hospice care at home, comfort measures here  -Declined ICU admission at this time  -Continue to be DNR/DNI  -Palliative care consult        I spent 28 minutes providing voluntary advance care planning services for this patient.    [name] MD, PGY-2  [facility] Internal Medicine Residency

## 2021-08-05 NOTE — Unmapped (Signed)
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 *      HPI: Gary Baird is a 81 y.o. 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 difficile, and GERD admitted for leukocytosis and acute leukemia with concern for blast crisis (87% blasts on differential).  After communicating poor prognosis and discussing GOC with patient and family at bedside, decision was made to not proceed with ICU admission and instead go home with hospice care.    Neurological   NAI    Pulmonary   Hypoxia  Progressing oxygen requirement in the setting of fluid resuscitation, leukostasis could potentially be contributing as well.  Will go home with oxygen  -Continue large bore [place], will discharge to hospice at this  -Hydrea as below    Cardiovascular   NAI    Renal   AKI  Creatinine 1.2 up from baseline of 0.60 to 0.7.  -S/p fluid resuscitation    Infectious Disease/Autoimmune   Fever  Presented from urgent care with fever to 102.  Most likely secondary to cancer progression, other opportunistic infection is also possibility  -S/p Vanco/cefepime in ED  -No further interventions given comfort care    Cultures:  No results found for: BLOOD CULTURE, URINE CULTURE, LOWER RESPIRATORY CULTURE  WBC (10*9/L)   Date Value   08/30/2021 75.4 (HH)     WBC, UA (/HPF)   Date Value   08/23/2021 4 (H)         FEN/GI   NAI      Heme/Coag   CMML w/ transformation to AML  Follows with Dr. Vertell Limber at [facility].  CMML progressed to AML, first noted in 01/2021.  Had been on a palliative course of Aza/Ven, was on cycle 4.  Presents today with significant leukocytosis and fever, likely due to cancer progression.  Also with hypoxia, potentially secondary to leukostasis but more likely secondary to fluid resuscitation.  Case discussed with oncology who confirmed he had a guarded prognosis with days to weeks.  Decision made by oncology to continue hydroxyurea palliatively on discharge  -Start hydroxyurea 2 g twice daily  -Proceed with hospice care at home    Endocrine   NAI    Integumentary   NAI    Prophylaxis/LDA/Restraints/Consults       Patient Lines/Drains/Airways Status       Active Active Lines, Drains, & Airways       Name Placement date Placement time Site Days    Peripheral IV 08/03/21 Left Antecubital 08/03/21  1957  Antecubital  less than 1    Peripheral IV 09/01/2021 Right Antecubital 08/20/2021  0421  Antecubital  less than 1                  Patient Lines/Drains/Airways Status       Active Wounds       None                    Goals of Care     Code Status: DNR/DNI    Designated Healthcare Decision Maker:  Mr.  Bise Jr's current decisional capacity for healthcare decision-making is Full capacity. His designated healthcare decision maker(s) is/are   HCDM (patient stated preference): Victorino Dike - Daughter - [phone].      Subjective     HPI:  Gary Baird is a 81 y.o. male with past medical history significant for CMML progressed to AML here with fever.  He presented to an urgent care earlier today with a temperature found to be 102 and some abdominal pain.  He subsequently presented to [facility] ED.  Shortly after arrival, abdominal pain resolved.  Febrile to 100.7 F in the ED and received some fluids with cefepime/vancomycin.  Following GOC discussions, decision was made to briefly admit patient while arranging hospice at home.    ROS: Ten-point review of systems is obtained and is negative except as noted in HPI.    Allergies  No Known Allergies    Meds  No current facility-administered medications on file prior to encounter.     Current Outpatient Medications on File Prior to Encounter   Medication Sig    allopurinol (ZYLOPRIM) 300 MG tablet Take 1 tablet (300 mg total) by mouth daily.    melatonin 10 mg cap Take 1 capsule (10 mg total) by mouth nightly.    pantoprazole (PROTONIX) 40 MG tablet Take 1 tablet (40 mg total) by mouth daily.    prochlorperazine (COMPAZINE) 10 MG tablet Take 1 tablet (10 mg total) by mouth every six (6) hours as needed for nausea.    traZODone (DESYREL) 50 MG tablet Take 1 tablet (50 mg total) by mouth nightly.    valACYclovir (VALTREX) 500 MG tablet Take 1 tablet (500 mg total) by mouth daily.       Past Medical History  Past Medical History:   Diagnosis Date    Cancer (CMS-HCC)     Hypertension     Leukemia (CMS-HCC)        Past Surgical History  Past Surgical History:   Procedure Laterality Date    HERNIA REPAIR  1975    PR COLONOSCOPY FLX DX W/COLLJ SPEC WHEN PFRMD N/A 02/02/2021    Procedure: COLONOSCOPY, FLEXIBLE, PROXIMAL TO SPLENIC FLEXURE; DIAGNOSTIC, W/WO COLLECTION SPECIMEN BY BRUSH OR WASH;  Surgeon: Mayford Knife, MD;  [facility];  Service: Gastroenterology    PR ENDOSCOPY UPPER SMALL INTESTINE N/A 03/17/2021    Procedure: SMALL INTESTINAL ENDOSCOPY, ENTEROSCOPY BEYOND SECOND PORTION OF DUODENUM, NOT INCL ILEUM; DX, INCL COLLECTION OF SPECIMEN(S) BY BRUSHING OR WASHING, WHEN PERFORMED;  Surgeon: Vonda Antigua, MD;  [facility];  Service: Gastroenterology    PR GASTROINTESTINAL TRACT IMAGING ESOPHAGUS W/I&R N/A 02/02/2021    Procedure: GI PATENCY TRACT IMAGING, INTRALUMINAL (EG, CAPSULE ENDOSCOPY), ESOPHAGUS W/PHYSICIAN INTERP/REPORT;  Surgeon: Mayford Knife, MD;  Location: [facility];  Service: Gastroenterology    PR GI IMAG INTRALUMINAL ESOPHAGUS-ILEUM W/I&R N/A 03/15/2021    Procedure: GI VIDEO TRACT IMAGE INTRALUMINAL (EG, CAPSULE ENDOSCOPY), ESOPHAGUS VIA ILEUM, PHYSICIAN INTERPRETATION & REPORT;  Surgeon: Monte Fantasia, MD;  Location: [facility];  Service: Gastroenterology    PR UPPER GI ENDOSCOPY,DIAGNOSIS N/A 02/02/2021    Procedure: UGI ENDO, INCLUDE ESOPHAGUS, STOMACH, & DUODENUM &/OR JEJUNUM; DX W/WO COLLECTION SPECIMN, BY BRUSH OR WASH;  Surgeon: Mayford Knife, MD;  [facility]: [facility];  Service: Gastroenterology    SKIN BIOPSY         Family History  Family History   Problem Relation  Age of Onset    Alzheimer's disease Father     Melanoma Neg Hx     Basal cell carcinoma Neg Hx     Squamous cell carcinoma Neg Hx        Social History  Social History     Socioeconomic History    Marital status: Divorced    Number of children: 4   Tobacco Use    Smoking status: Never    Smokeless tobacco: Never   Vaping Use    Vaping Use: Never used   Substance and Sexual Activity    Alcohol use: Not Currently    Drug use: Never    Sexual activity: Yes     Partners: Female   Other Topics Concern    Do you use sunscreen? No    Tanning bed use? No    Are you easily burned? No    Excessive sun exposure? Yes    Blistering sunburns? No     Social Determinants of Psychologist, prison and probation services Strain: Low Risk     Difficulty of Paying Living Expenses: Not very hard   Food Insecurity: No Food Insecurity    Worried About Programme researcher, broadcasting/film/video in the Last Year: Never true    Ran Out of Food in the Last Year: Never true   Transportation Needs: No Transportation Needs    Lack of Transportation (Medical): No    Lack of Transportation (Non-Medical): No           Objective     Vitals - past 24 hours  Temp:  [36.6 ??C (97.9 ??F)-38.2 ??C (100.7 ??F)] 36.7 ??C (98 ??F)  Heart Rate:  [113-147] 147  SpO2 Pulse:  [84-187] 136  Resp:  [18-48] 47  BP: (93-154)/(49-139) 106/70  SpO2:  [84 %-96 %] 96 % Intake/Output  No intake/output data recorded.     Physical Exam:    General: Friendly elderly man at rest in bed in NAD  HEENT: [place], AT  CV: Well-perfused, sinus rhythm  Pulm: Normal work of breathing on 11 L large bore. No audible wheezes or cough during exam  GI: Non-distended  Skin: No visible rash or lesions on clothed exam  Neuro: Alert and oriented.  No focal deficits, moves all extremities    There is no height or weight on file to calculate BMI.            Wt Readings from Last 12 Encounters:   07/07/21 (!) 126.9 kg (279 lb 12.2 oz)   07/01/21 (!) 128 kg (282 lb 1.6 oz)   06/17/21 (!) 125.7 kg (277 lb 3.2 oz)   05/03/21 (!) 134.1 kg (295 lb 9.6 oz)   04/26/21 (!) 130.3 kg (287 lb 4.8 oz)   04/13/21 (!) 135.6 kg (299 lb)   03/18/21 (!) 137 kg (302 lb 0.5 oz)   03/03/21 (!) 140 kg (308 lb 9.6 oz)   02/21/21 (!) 142.9 kg (315 lb)   02/17/21 (!) 142.9 kg (315 lb 0.6 oz)   02/15/21 (!) 142.9 kg (315 lb 0.6 oz)   02/14/21 (!) 142 kg (313 lb)       Continuous Infusions:       Scheduled Medications:       PRN medications:      Data/Imaging Review: Reviewed in Epic and personally interpreted on 08/14/2021. See EMR for detailed results.

## 2021-08-08 ENCOUNTER — Other Ambulatory Visit: Payer: Medicare Other

## 2021-08-08 ENCOUNTER — Ambulatory Visit: Payer: Medicare Other

## 2021-08-08 ENCOUNTER — Ambulatory Visit: Payer: Medicare Other | Admitting: Nurse Practitioner

## 2021-08-09 ENCOUNTER — Ambulatory Visit: Payer: Medicare Other

## 2021-08-10 ENCOUNTER — Ambulatory Visit: Payer: Medicare Other

## 2021-08-11 ENCOUNTER — Ambulatory Visit: Payer: Medicare Other

## 2021-08-12 ENCOUNTER — Other Ambulatory Visit: Payer: Self-pay | Admitting: Oncology

## 2021-08-12 ENCOUNTER — Ambulatory Visit: Payer: Medicare Other

## 2021-08-17 ENCOUNTER — Ambulatory Visit: Payer: Medicare Other | Admitting: Oncology

## 2021-08-17 ENCOUNTER — Other Ambulatory Visit: Payer: Medicare Other

## 2021-09-03 DEATH — deceased

## 2021-09-09 ENCOUNTER — Ambulatory Visit: Payer: Medicare Other | Admitting: Nurse Practitioner

## 2021-09-22 ENCOUNTER — Encounter: Payer: Self-pay | Admitting: Internal Medicine

## 2021-10-03 ENCOUNTER — Ambulatory Visit: Payer: Medicare Other | Admitting: Nurse Practitioner

## 2022-03-17 IMAGING — US US EXTREM LOW VENOUS*R*
1 series · 13 of 24 positions shown · non-contrast
Comparison: None.

CLINICAL DATA: Right lower extremity edema.



[Series 1: us venous img lower uni right (dvt) · portal-venous · 13 of 34 slices shown]
[im 1/34]
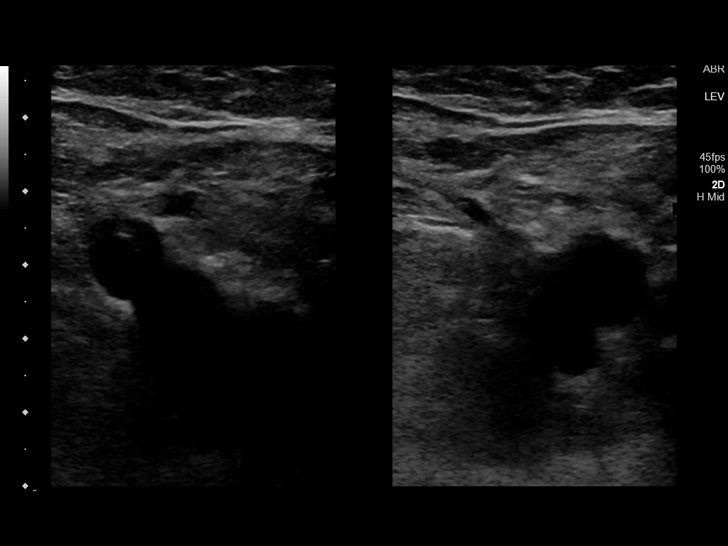
[im 3/34]
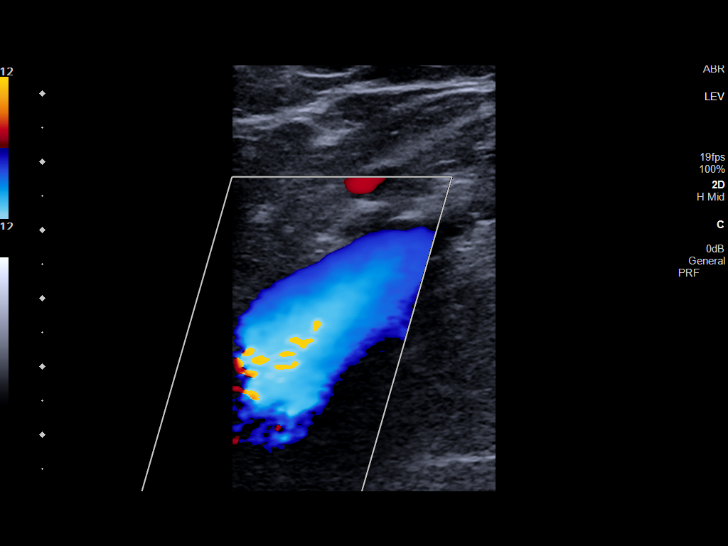
[im 6/34]
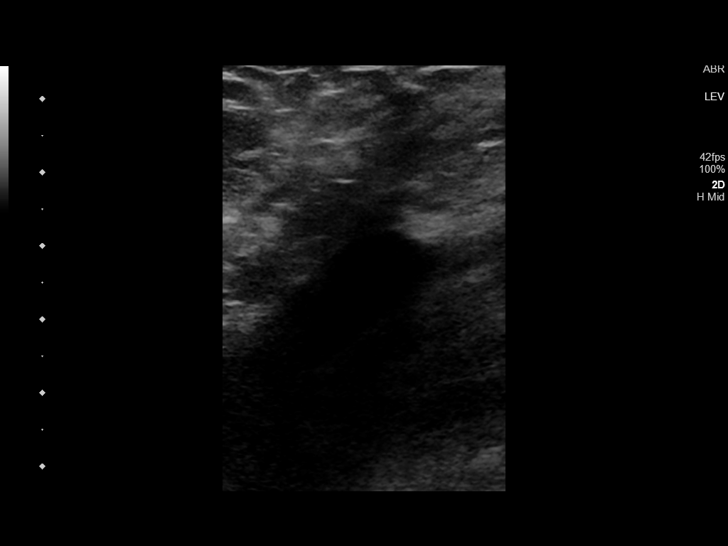
[im 9/34]
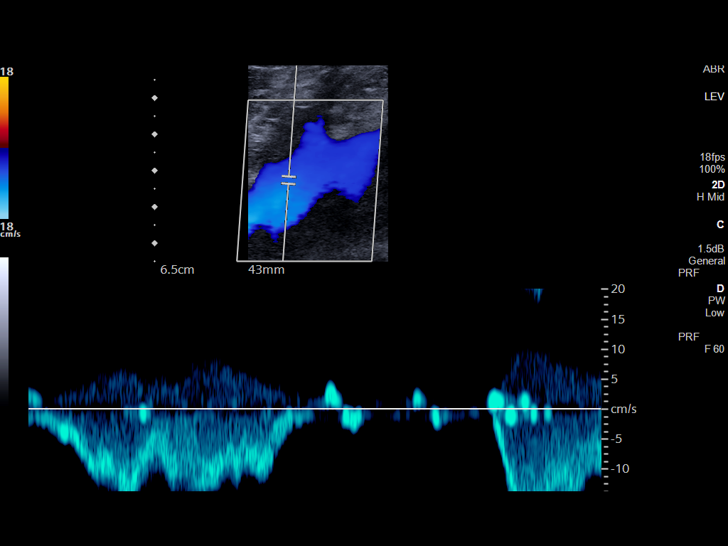
[im 12/34]
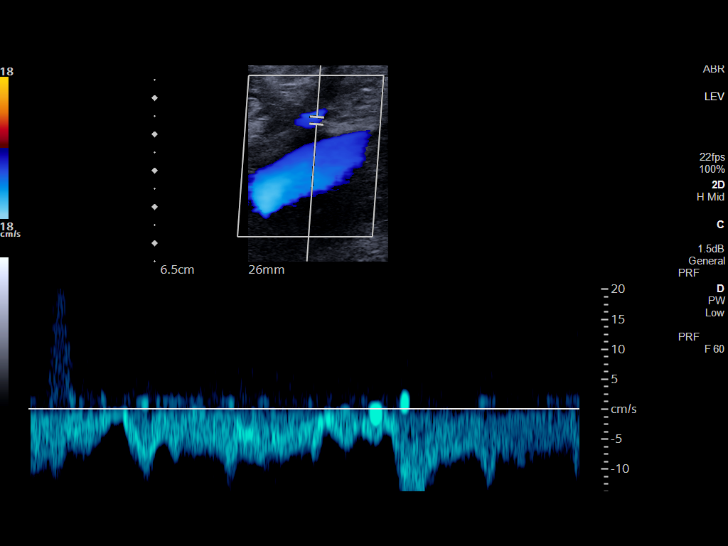
[im 15/34]
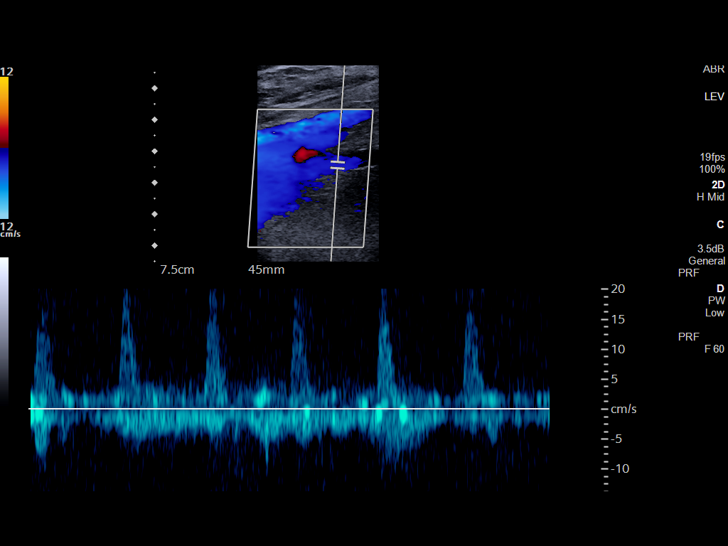
[im 18/34]
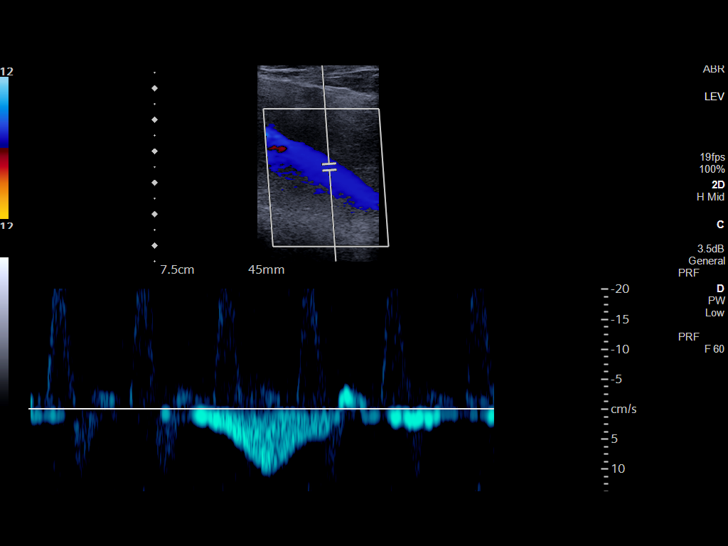
[im 19/34]
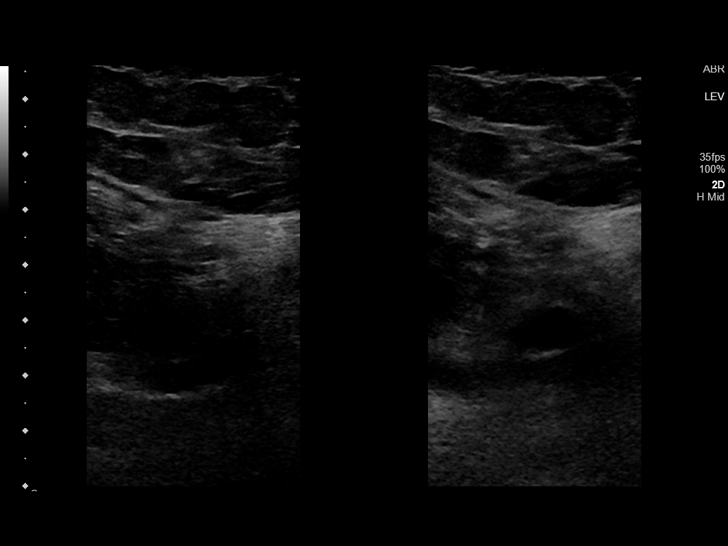
[im 22/34]
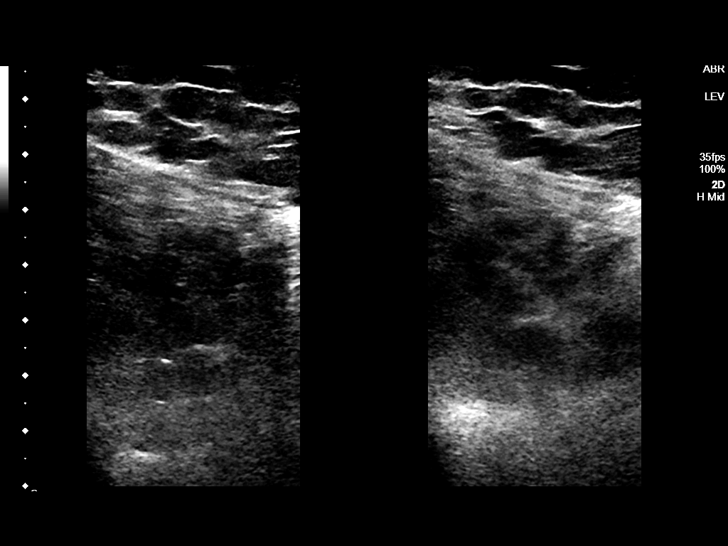
[im 25/34]
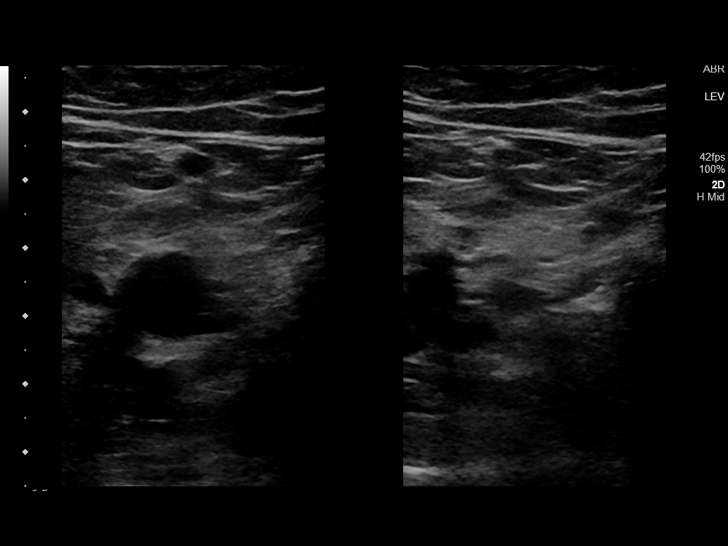
[im 28/34]
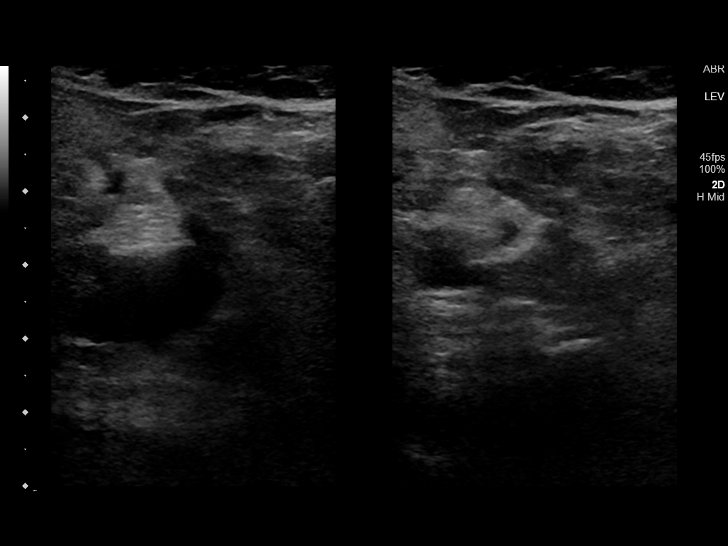
[im 31/34]
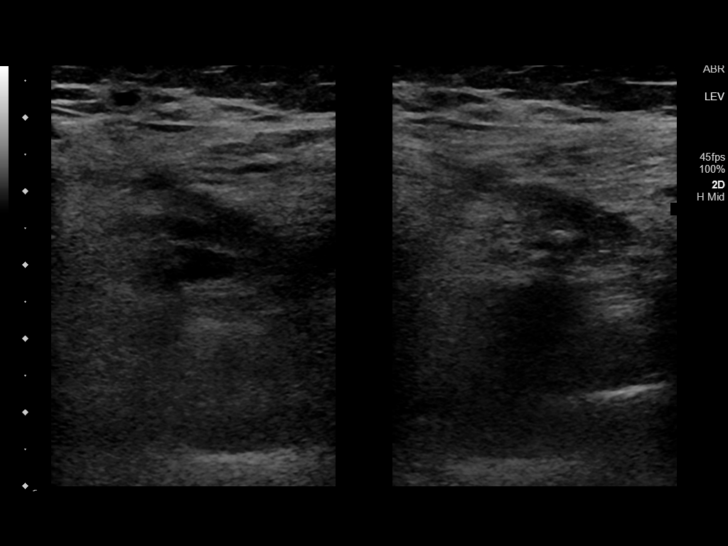
[im 34/34]
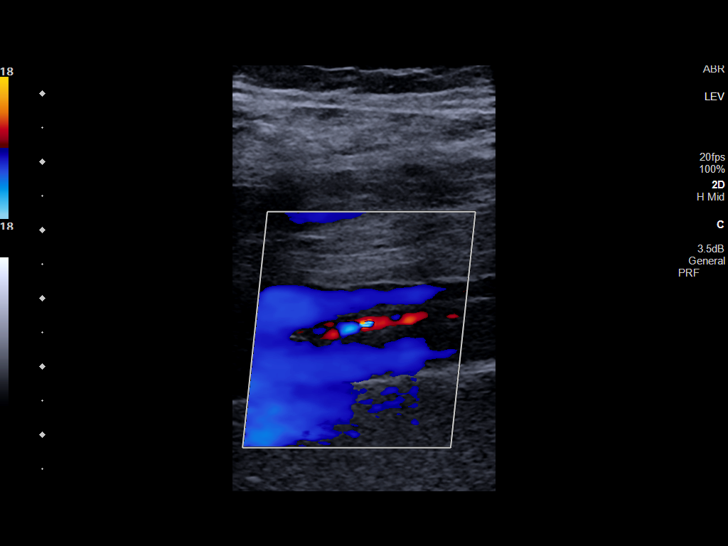

[13 of 24 positions shown; findings below may reference images not displayed]

FINDINGS: Contralateral Common Femoral Vein: Respiratory phasicity is normal
and symmetric with the symptomatic side. No evidence of thrombus.
Normal compressibility.

Common Femoral Vein: No evidence of thrombus. Normal
compressibility, respiratory phasicity and response to augmentation.

Saphenofemoral Junction: No evidence of thrombus. Normal
compressibility and flow on color Doppler imaging.

Profunda Femoral Vein: No evidence of thrombus. Normal
compressibility and flow on color Doppler imaging.

Femoral Vein: No evidence of thrombus. Normal compressibility,
respiratory phasicity and response to augmentation.

Popliteal Vein: No evidence of thrombus. Normal compressibility,
respiratory phasicity and response to augmentation.

Calf Veins: No evidence of thrombus. Normal compressibility and flow
on color Doppler imaging.

Superficial Great Saphenous Vein: No evidence of thrombus. Normal
compressibility.

Venous Reflux:  None.

Other Findings: No evidence of superficial thrombophlebitis or
abnormal fluid collection.
IMPRESSION: No evidence of right lower extremity deep venous thrombosis.

## 2022-06-13 ENCOUNTER — Ambulatory Visit (INDEPENDENT_AMBULATORY_CARE_PROVIDER_SITE_OTHER): Payer: Medicare Other | Admitting: Vascular Surgery
